# Patient Record
Sex: Female | Born: 1955 | Race: White | Hispanic: No | Smoking: Current every day smoker
Health system: Southern US, Community
[De-identification: ages and names within clinical notes are randomized; demographics above are authoritative.]

## PROBLEM LIST (undated history)

## (undated) DIAGNOSIS — F32A Depression, unspecified: Secondary | ICD-10-CM

## (undated) DIAGNOSIS — F988 Other specified behavioral and emotional disorders with onset usually occurring in childhood and adolescence: Secondary | ICD-10-CM

## (undated) DIAGNOSIS — M1990 Unspecified osteoarthritis, unspecified site: Secondary | ICD-10-CM

## (undated) DIAGNOSIS — F419 Anxiety disorder, unspecified: Secondary | ICD-10-CM

## (undated) DIAGNOSIS — M797 Fibromyalgia: Secondary | ICD-10-CM

## (undated) DIAGNOSIS — F329 Major depressive disorder, single episode, unspecified: Secondary | ICD-10-CM

## (undated) DIAGNOSIS — I4891 Unspecified atrial fibrillation: Secondary | ICD-10-CM

## (undated) DIAGNOSIS — E785 Hyperlipidemia, unspecified: Secondary | ICD-10-CM

## (undated) DIAGNOSIS — J449 Chronic obstructive pulmonary disease, unspecified: Secondary | ICD-10-CM

## (undated) DIAGNOSIS — I5020 Unspecified systolic (congestive) heart failure: Secondary | ICD-10-CM

## (undated) DIAGNOSIS — F1910 Other psychoactive substance abuse, uncomplicated: Secondary | ICD-10-CM

## (undated) HISTORY — PX: ANKLE SURGERY: SHX546

## (undated) HISTORY — PX: FACIAL COSMETIC SURGERY: SHX629

---

## 1998-04-07 ENCOUNTER — Emergency Department (HOSPITAL_COMMUNITY): Admission: EM | Admit: 1998-04-07 | Discharge: 1998-04-07 | Payer: Self-pay | Admitting: Emergency Medicine

## 1998-08-22 ENCOUNTER — Emergency Department (HOSPITAL_COMMUNITY): Admission: EM | Admit: 1998-08-22 | Discharge: 1998-08-22 | Payer: Self-pay | Admitting: Emergency Medicine

## 1998-12-05 ENCOUNTER — Emergency Department (HOSPITAL_COMMUNITY): Admission: EM | Admit: 1998-12-05 | Discharge: 1998-12-05 | Payer: Self-pay | Admitting: Emergency Medicine

## 1999-09-17 ENCOUNTER — Encounter: Payer: Self-pay | Admitting: *Deleted

## 1999-09-17 ENCOUNTER — Emergency Department (HOSPITAL_COMMUNITY): Admission: EM | Admit: 1999-09-17 | Discharge: 1999-09-17 | Payer: Self-pay | Admitting: *Deleted

## 1999-09-18 ENCOUNTER — Encounter: Payer: Self-pay | Admitting: Emergency Medicine

## 1999-09-18 ENCOUNTER — Emergency Department (HOSPITAL_COMMUNITY): Admission: EM | Admit: 1999-09-18 | Discharge: 1999-09-18 | Payer: Self-pay | Admitting: Emergency Medicine

## 1999-12-27 ENCOUNTER — Emergency Department (HOSPITAL_COMMUNITY): Admission: EM | Admit: 1999-12-27 | Discharge: 1999-12-27 | Payer: Self-pay | Admitting: Emergency Medicine

## 2000-05-22 ENCOUNTER — Emergency Department (HOSPITAL_COMMUNITY): Admission: EM | Admit: 2000-05-22 | Discharge: 2000-05-22 | Payer: Self-pay | Admitting: Emergency Medicine

## 2000-09-13 ENCOUNTER — Emergency Department (HOSPITAL_COMMUNITY): Admission: EM | Admit: 2000-09-13 | Discharge: 2000-09-13 | Payer: Self-pay | Admitting: Emergency Medicine

## 2000-09-13 ENCOUNTER — Encounter: Payer: Self-pay | Admitting: Emergency Medicine

## 2002-04-29 ENCOUNTER — Encounter: Payer: Self-pay | Admitting: Emergency Medicine

## 2002-04-29 ENCOUNTER — Emergency Department (HOSPITAL_COMMUNITY): Admission: EM | Admit: 2002-04-29 | Discharge: 2002-04-29 | Payer: Self-pay | Admitting: Emergency Medicine

## 2003-12-08 ENCOUNTER — Emergency Department (HOSPITAL_COMMUNITY): Admission: EM | Admit: 2003-12-08 | Discharge: 2003-12-08 | Payer: Self-pay

## 2003-12-12 ENCOUNTER — Emergency Department (HOSPITAL_COMMUNITY): Admission: EM | Admit: 2003-12-12 | Discharge: 2003-12-12 | Payer: Self-pay | Admitting: Family Medicine

## 2003-12-13 ENCOUNTER — Emergency Department (HOSPITAL_COMMUNITY): Admission: EM | Admit: 2003-12-13 | Discharge: 2003-12-13 | Payer: Self-pay | Admitting: Family Medicine

## 2004-03-08 ENCOUNTER — Emergency Department (HOSPITAL_COMMUNITY): Admission: EM | Admit: 2004-03-08 | Discharge: 2004-03-08 | Payer: Self-pay | Admitting: *Deleted

## 2004-10-02 ENCOUNTER — Ambulatory Visit: Payer: Self-pay | Admitting: Internal Medicine

## 2005-03-31 ENCOUNTER — Emergency Department (HOSPITAL_COMMUNITY): Admission: EM | Admit: 2005-03-31 | Discharge: 2005-03-31 | Payer: Self-pay | Admitting: Emergency Medicine

## 2005-05-12 ENCOUNTER — Emergency Department (HOSPITAL_COMMUNITY): Admission: EM | Admit: 2005-05-12 | Discharge: 2005-05-12 | Payer: Self-pay | Admitting: Family Medicine

## 2005-07-23 ENCOUNTER — Emergency Department (HOSPITAL_COMMUNITY): Admission: EM | Admit: 2005-07-23 | Discharge: 2005-07-23 | Payer: Self-pay | Admitting: Emergency Medicine

## 2005-07-29 ENCOUNTER — Emergency Department (HOSPITAL_COMMUNITY): Admission: EM | Admit: 2005-07-29 | Discharge: 2005-07-29 | Payer: Self-pay | Admitting: Emergency Medicine

## 2005-08-12 ENCOUNTER — Ambulatory Visit: Payer: Self-pay | Admitting: Internal Medicine

## 2006-02-16 ENCOUNTER — Ambulatory Visit: Payer: Self-pay | Admitting: Family Medicine

## 2006-02-16 ENCOUNTER — Ambulatory Visit: Payer: Self-pay | Admitting: *Deleted

## 2006-04-10 DIAGNOSIS — Z8719 Personal history of other diseases of the digestive system: Secondary | ICD-10-CM | POA: Insufficient documentation

## 2006-04-21 ENCOUNTER — Emergency Department (HOSPITAL_COMMUNITY): Admission: EM | Admit: 2006-04-21 | Discharge: 2006-04-21 | Payer: Self-pay | Admitting: *Deleted

## 2006-06-01 ENCOUNTER — Emergency Department (HOSPITAL_COMMUNITY): Admission: EM | Admit: 2006-06-01 | Discharge: 2006-06-01 | Payer: Self-pay | Admitting: Emergency Medicine

## 2006-06-01 DIAGNOSIS — K5730 Diverticulosis of large intestine without perforation or abscess without bleeding: Secondary | ICD-10-CM | POA: Insufficient documentation

## 2006-07-14 ENCOUNTER — Emergency Department (HOSPITAL_COMMUNITY): Admission: AC | Admit: 2006-07-14 | Discharge: 2006-07-14 | Payer: Self-pay

## 2006-07-15 ENCOUNTER — Emergency Department (HOSPITAL_COMMUNITY): Admission: EM | Admit: 2006-07-15 | Discharge: 2006-07-16 | Payer: Self-pay | Admitting: Emergency Medicine

## 2006-09-12 ENCOUNTER — Emergency Department (HOSPITAL_COMMUNITY): Admission: EM | Admit: 2006-09-12 | Discharge: 2006-09-12 | Payer: Self-pay | Admitting: Emergency Medicine

## 2006-09-15 ENCOUNTER — Inpatient Hospital Stay (HOSPITAL_COMMUNITY): Admission: AD | Admit: 2006-09-15 | Discharge: 2006-09-15 | Payer: Self-pay | Admitting: Obstetrics and Gynecology

## 2006-09-20 ENCOUNTER — Ambulatory Visit: Payer: Self-pay | Admitting: Internal Medicine

## 2006-10-20 ENCOUNTER — Encounter (INDEPENDENT_AMBULATORY_CARE_PROVIDER_SITE_OTHER): Payer: Self-pay | Admitting: *Deleted

## 2006-10-20 ENCOUNTER — Encounter (INDEPENDENT_AMBULATORY_CARE_PROVIDER_SITE_OTHER): Payer: Self-pay | Admitting: Internal Medicine

## 2006-10-20 ENCOUNTER — Ambulatory Visit: Payer: Self-pay | Admitting: *Deleted

## 2006-11-08 ENCOUNTER — Ambulatory Visit (HOSPITAL_COMMUNITY): Admission: RE | Admit: 2006-11-08 | Discharge: 2006-11-08 | Payer: Self-pay | Admitting: Obstetrics & Gynecology

## 2006-11-26 ENCOUNTER — Encounter: Admission: RE | Admit: 2006-11-26 | Discharge: 2006-11-26 | Payer: Self-pay | Admitting: Internal Medicine

## 2006-12-02 ENCOUNTER — Ambulatory Visit: Payer: Self-pay | Admitting: Internal Medicine

## 2006-12-02 DIAGNOSIS — G5600 Carpal tunnel syndrome, unspecified upper limb: Secondary | ICD-10-CM | POA: Insufficient documentation

## 2007-01-04 ENCOUNTER — Ambulatory Visit: Payer: Self-pay | Admitting: Internal Medicine

## 2007-04-27 ENCOUNTER — Encounter (INDEPENDENT_AMBULATORY_CARE_PROVIDER_SITE_OTHER): Payer: Self-pay | Admitting: *Deleted

## 2007-05-09 ENCOUNTER — Telehealth (INDEPENDENT_AMBULATORY_CARE_PROVIDER_SITE_OTHER): Payer: Self-pay | Admitting: *Deleted

## 2007-05-13 DIAGNOSIS — R498 Other voice and resonance disorders: Secondary | ICD-10-CM | POA: Insufficient documentation

## 2007-05-13 DIAGNOSIS — G4700 Insomnia, unspecified: Secondary | ICD-10-CM | POA: Insufficient documentation

## 2007-05-26 ENCOUNTER — Ambulatory Visit: Payer: Self-pay | Admitting: Internal Medicine

## 2007-05-26 DIAGNOSIS — M76899 Other specified enthesopathies of unspecified lower limb, excluding foot: Secondary | ICD-10-CM | POA: Insufficient documentation

## 2007-05-26 DIAGNOSIS — F1910 Other psychoactive substance abuse, uncomplicated: Secondary | ICD-10-CM | POA: Insufficient documentation

## 2007-05-26 DIAGNOSIS — M545 Low back pain, unspecified: Secondary | ICD-10-CM | POA: Insufficient documentation

## 2007-05-26 DIAGNOSIS — K7460 Unspecified cirrhosis of liver: Secondary | ICD-10-CM | POA: Insufficient documentation

## 2007-05-26 DIAGNOSIS — B1710 Acute hepatitis C without hepatic coma: Secondary | ICD-10-CM | POA: Insufficient documentation

## 2007-05-27 ENCOUNTER — Encounter (INDEPENDENT_AMBULATORY_CARE_PROVIDER_SITE_OTHER): Payer: Self-pay | Admitting: Internal Medicine

## 2007-06-02 ENCOUNTER — Encounter (INDEPENDENT_AMBULATORY_CARE_PROVIDER_SITE_OTHER): Payer: Self-pay | Admitting: Internal Medicine

## 2007-06-03 ENCOUNTER — Emergency Department (HOSPITAL_COMMUNITY): Admission: EM | Admit: 2007-06-03 | Discharge: 2007-06-03 | Payer: Self-pay | Admitting: Emergency Medicine

## 2007-06-12 ENCOUNTER — Encounter (INDEPENDENT_AMBULATORY_CARE_PROVIDER_SITE_OTHER): Payer: Self-pay | Admitting: Internal Medicine

## 2007-06-14 LAB — CONVERTED CEMR LAB
ALT: 19 units/L (ref 0–35)
Albumin: 3.7 g/dL (ref 3.5–5.2)
Alkaline Phosphatase: 58 units/L (ref 39–117)
Basophils Absolute: 0 10*3/uL (ref 0.0–0.1)
Eosinophils Absolute: 0.1 10*3/uL (ref 0.0–0.7)
Glucose, Bld: 77 mg/dL (ref 70–99)
HCV Quantitative: 47300 intl units/mL — ABNORMAL HIGH (ref <5)
Lymphocytes Relative: 16 % (ref 12–46)
Lymphs Abs: 1.3 10*3/uL (ref 0.7–3.3)
MCV: 90.6 fL (ref 78.0–100.0)
Neutrophils Relative %: 76 % (ref 43–77)
Platelets: 338 10*3/uL (ref 150–400)
Potassium: 4.7 meq/L (ref 3.5–5.3)
RDW: 16.7 % — ABNORMAL HIGH (ref 11.5–14.0)
Sodium: 139 meq/L (ref 135–145)
Total Protein: 6.7 g/dL (ref 6.0–8.3)
WBC: 8.2 10*3/uL (ref 4.0–10.5)

## 2007-06-28 ENCOUNTER — Ambulatory Visit: Payer: Self-pay | Admitting: Internal Medicine

## 2007-06-28 DIAGNOSIS — B351 Tinea unguium: Secondary | ICD-10-CM | POA: Insufficient documentation

## 2007-06-29 ENCOUNTER — Ambulatory Visit: Payer: Self-pay | Admitting: Internal Medicine

## 2007-06-29 LAB — CONVERTED CEMR LAB: INR: 0.9 (ref 0.0–1.5)

## 2007-09-08 ENCOUNTER — Emergency Department (HOSPITAL_COMMUNITY): Admission: EM | Admit: 2007-09-08 | Discharge: 2007-09-08 | Payer: Self-pay | Admitting: Emergency Medicine

## 2007-10-11 ENCOUNTER — Ambulatory Visit: Payer: Self-pay | Admitting: Internal Medicine

## 2007-10-11 DIAGNOSIS — J019 Acute sinusitis, unspecified: Secondary | ICD-10-CM | POA: Insufficient documentation

## 2007-10-12 ENCOUNTER — Telehealth (INDEPENDENT_AMBULATORY_CARE_PROVIDER_SITE_OTHER): Payer: Self-pay | Admitting: Internal Medicine

## 2007-10-17 ENCOUNTER — Emergency Department (HOSPITAL_COMMUNITY): Admission: EM | Admit: 2007-10-17 | Discharge: 2007-10-17 | Payer: Self-pay | Admitting: Emergency Medicine
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
## 2008-02-27 ENCOUNTER — Emergency Department (HOSPITAL_COMMUNITY): Admission: EM | Admit: 2008-02-27 | Discharge: 2008-02-27 | Payer: Self-pay | Admitting: Emergency Medicine

## 2009-09-05 ENCOUNTER — Emergency Department (HOSPITAL_COMMUNITY): Admission: EM | Admit: 2009-09-05 | Discharge: 2009-09-06 | Payer: Self-pay | Admitting: Emergency Medicine

## 2009-09-28 ENCOUNTER — Emergency Department (HOSPITAL_BASED_OUTPATIENT_CLINIC_OR_DEPARTMENT_OTHER): Admission: EM | Admit: 2009-09-28 | Discharge: 2009-09-28 | Payer: Self-pay | Admitting: Emergency Medicine

## 2010-10-27 LAB — POCT I-STAT, CHEM 8
BUN: 13 mg/dL (ref 6–23)
Chloride: 109 mEq/L (ref 96–112)
Creatinine, Ser: 0.6 mg/dL (ref 0.4–1.2)
Potassium: 4.1 mEq/L (ref 3.5–5.1)
Sodium: 142 mEq/L (ref 135–145)
TCO2: 26 mmol/L (ref 0–100)

## 2010-10-27 LAB — URINALYSIS, ROUTINE W REFLEX MICROSCOPIC
Glucose, UA: NEGATIVE mg/dL
Hgb urine dipstick: NEGATIVE
Specific Gravity, Urine: 1.028 (ref 1.005–1.030)
pH: 6.5 (ref 5.0–8.0)

## 2010-10-28 ENCOUNTER — Emergency Department (HOSPITAL_COMMUNITY): Payer: BC Managed Care – PPO

## 2010-10-28 ENCOUNTER — Emergency Department (HOSPITAL_COMMUNITY)
Admission: EM | Admit: 2010-10-28 | Discharge: 2010-10-28 | Disposition: A | Discharge status: Home / Self Care | Payer: BC Managed Care – PPO | Attending: Emergency Medicine | Admitting: Emergency Medicine

## 2010-10-28 DIAGNOSIS — I10 Essential (primary) hypertension: Secondary | ICD-10-CM | POA: Insufficient documentation

## 2010-10-28 DIAGNOSIS — M25559 Pain in unspecified hip: Secondary | ICD-10-CM | POA: Insufficient documentation

## 2010-10-28 DIAGNOSIS — M545 Low back pain, unspecified: Secondary | ICD-10-CM | POA: Insufficient documentation
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
## 2012-06-01 ENCOUNTER — Emergency Department (HOSPITAL_COMMUNITY)
Admission: EM | Admit: 2012-06-01 | Discharge: 2012-06-01 | Disposition: A | Discharge status: Home / Self Care | Payer: BC Managed Care – PPO | Attending: Emergency Medicine | Admitting: Emergency Medicine

## 2012-06-01 ENCOUNTER — Encounter (HOSPITAL_COMMUNITY): Payer: Self-pay | Admitting: *Deleted

## 2012-06-01 DIAGNOSIS — F17200 Nicotine dependence, unspecified, uncomplicated: Secondary | ICD-10-CM | POA: Insufficient documentation

## 2012-06-01 DIAGNOSIS — F988 Other specified behavioral and emotional disorders with onset usually occurring in childhood and adolescence: Secondary | ICD-10-CM | POA: Insufficient documentation

## 2012-06-01 DIAGNOSIS — M069 Rheumatoid arthritis, unspecified: Secondary | ICD-10-CM | POA: Insufficient documentation

## 2012-06-01 DIAGNOSIS — Z79899 Other long term (current) drug therapy: Secondary | ICD-10-CM | POA: Insufficient documentation

## 2012-06-01 DIAGNOSIS — F1910 Other psychoactive substance abuse, uncomplicated: Secondary | ICD-10-CM | POA: Insufficient documentation

## 2012-06-01 HISTORY — DX: Other specified behavioral and emotional disorders with onset usually occurring in childhood and adolescence: F98.8

## 2012-06-01 HISTORY — DX: Unspecified osteoarthritis, unspecified site: M19.90

## 2012-06-01 HISTORY — DX: Other psychoactive substance abuse, uncomplicated: F19.10

## 2012-06-01 MED ORDER — PREDNISONE 20 MG PO TABS: ORAL_TABLET | ORAL | Status: DC

## 2012-06-01 MED ORDER — PREDNISONE 20 MG PO TABS
60.0000 mg | ORAL_TABLET | Freq: Once | ORAL | Status: AC
  Administered 2012-06-01: 60 mg via ORAL
  Filled 2012-06-01: qty 3

## 2012-06-01 NOTE — ED Provider Notes (Signed)
History     CSN: [id number]  Arrival date & time 06/01/12  0019   First MD Initiated Contact with Patient 06/01/12 0128      Chief Complaint  Patient presents with  . Hand Pain    (Consider location/radiation/quality/duration/timing/severity/associated sxs/prior treatment) HPI Comments: Patient with a history of particularly in her hands has increased stable until this morning when she noticed increased pain and swelling particularly of the right hand she's taking over-the-counter ibuprofen without much relief.  Patient is a 56 y.o. female presenting with hand pain. The history is provided by the patient.  Hand Pain This is a recurrent problem. The current episode started today. Associated symptoms include joint swelling. Pertinent negatives include no chills, fever, numbness or weakness.    Past Medical History  Diagnosis Date  . Arthritis   . Attention deficit disorder (ADD)   . Drug abuse     History reviewed. No pertinent past surgical history.  No family history on file.  History  Substance Use Topics  . Smoking status: Current Some Day Smoker -- 0.5 packs/day  . Smokeless tobacco: Never Used  . Alcohol Use: No    OB History    Grav Para Term Preterm Abortions TAB SAB Ect Mult Living                  Review of Systems  Constitutional: Negative for fever and chills.  Musculoskeletal: Positive for joint swelling.  Skin: Positive for color change.  Neurological: Negative for dizziness, weakness and numbness.    Allergies  Review of patient's allergies indicates no known allergies.  Home Medications   Current Outpatient Rx  Name Route Sig Dispense Refill  . AMPHETAMINE-DEXTROAMPHET ER 30 MG PO CP24 Oral Take 30 mg by mouth every morning.    Marland Kitchen AMPHETAMINE-DEXTROAMPHETAMINE 10 MG PO TABS Oral Take 10 mg by mouth every evening.    Marland Kitchen BUPRENORPHINE HCL-NALOXONE HCL 8-2 MG SL SUBL Sublingual Place 1 tablet under the tongue daily. May take additional doses if  needed    . MAGNESIUM PO Oral Take 1 tablet by mouth daily.    . ADULT MULTIVITAMIN W/MINERALS CH Oral Take 1 tablet by mouth daily.    Marland Kitchen FISH OIL PO Oral Take 1 capsule by mouth daily.    Marland Kitchen PROBIOTIC DAILY PO Oral Take 1 tablet by mouth daily.    Marland Kitchen PREDNISONE 20 MG PO TABS  3 Tabs PO Days 1-3, then 2 tabs PO Days 4-6, then 1 tab PO Day 7-9, then Half Tab PO Day 10-12 20 tablet 0    BP 107/66  Pulse 88  Temp 98.3 F (36.8 C) (Oral)  Resp 20  SpO2 96%  Physical Exam  Constitutional: She appears well-developed and well-nourished.  HENT:  Head: Normocephalic.  Eyes: Pupils are equal, round, and reactive to light.  Neck: Normal range of motion.  Cardiovascular: Normal rate.   Pulmonary/Chest: Effort normal.  Musculoskeletal: She exhibits edema and tenderness.       Dorsal aspect of the right hand would swollen tender to touch decreased range of motion of the fifth finger at the DIP joint PIP joints the remainder of the fingers slightly swollen for including thumb  Neurological: She is alert.  Skin: Skin is warm. There is erythema.    ED Course  Procedures (including critical care time)  Labs Reviewed - No data to display No results found.   1. Rheumatoid arthritis flare       MDM   Rheumatoid  arthritis flare        Arman Filter, NP 06/02/12 2026

## 2012-06-01 NOTE — ED Notes (Signed)
Pt denies any questions upon discharge. 

## 2012-06-01 NOTE — ED Notes (Signed)
Pt reports rt hand is red ,swelling and painful. Pt reports a hx of AR in hands.

## 2012-06-04 NOTE — ED Provider Notes (Signed)
Medical screening examination/treatment/procedure(s) were performed by non-physician practitioner and as supervising physician I was immediately available for consultation/collaboration.  Cheri Guppy, MD 06/04/12 [phone]

## 2012-09-16 ENCOUNTER — Emergency Department (HOSPITAL_COMMUNITY)
Admission: EM | Admit: 2012-09-16 | Discharge: 2012-09-16 | Disposition: A | Discharge status: Home / Self Care | Payer: BC Managed Care – PPO | Attending: Emergency Medicine | Admitting: Emergency Medicine

## 2012-09-16 ENCOUNTER — Emergency Department (HOSPITAL_COMMUNITY): Payer: BC Managed Care – PPO

## 2012-09-16 ENCOUNTER — Encounter (HOSPITAL_COMMUNITY): Payer: Self-pay | Admitting: *Deleted

## 2012-09-16 DIAGNOSIS — W230XXA Caught, crushed, jammed, or pinched between moving objects, initial encounter: Secondary | ICD-10-CM | POA: Insufficient documentation

## 2012-09-16 DIAGNOSIS — M7989 Other specified soft tissue disorders: Secondary | ICD-10-CM | POA: Insufficient documentation

## 2012-09-16 DIAGNOSIS — Y9289 Other specified places as the place of occurrence of the external cause: Secondary | ICD-10-CM | POA: Insufficient documentation

## 2012-09-16 DIAGNOSIS — S6990XA Unspecified injury of unspecified wrist, hand and finger(s), initial encounter: Secondary | ICD-10-CM

## 2012-09-16 DIAGNOSIS — F17200 Nicotine dependence, unspecified, uncomplicated: Secondary | ICD-10-CM | POA: Insufficient documentation

## 2012-09-16 DIAGNOSIS — Y9389 Activity, other specified: Secondary | ICD-10-CM | POA: Insufficient documentation

## 2012-09-16 DIAGNOSIS — F988 Other specified behavioral and emotional disorders with onset usually occurring in childhood and adolescence: Secondary | ICD-10-CM | POA: Insufficient documentation

## 2012-09-16 DIAGNOSIS — IMO0002 Reserved for concepts with insufficient information to code with codable children: Secondary | ICD-10-CM | POA: Insufficient documentation

## 2012-09-16 MED ORDER — NAPROXEN 500 MG PO TABS
500.0000 mg | ORAL_TABLET | Freq: Once | ORAL | Status: AC
  Administered 2012-09-16: 500 mg via ORAL
  Filled 2012-09-16: qty 1

## 2012-09-16 MED ORDER — LIDOCAINE HCL 2 % IJ SOLN
INTRAMUSCULAR | Status: AC
  Administered 2012-09-16: 1 mL
  Filled 2012-09-16: qty 20

## 2012-09-16 MED ORDER — PREDNISONE 20 MG PO TABS: 60.0000 mg | ORAL_TABLET | Freq: Every day | ORAL | Status: DC

## 2012-09-16 NOTE — ED Notes (Signed)
Ring cutter at bedside

## 2012-09-16 NOTE — ED Notes (Signed)
Pt still in XRay 

## 2012-09-16 NOTE — ED Notes (Signed)
Pt has right thumb swollen with 3 rings on lower end of thumb.

## 2012-09-16 NOTE — ED Notes (Signed)
Pt hit right thumb at work on Monday; increased swelling; today painful-swollen red with white infected area noted; rings on thumb

## 2012-09-16 NOTE — ED Notes (Signed)
Pt ambulated to bathroom 

## 2012-09-24 NOTE — ED Provider Notes (Signed)
History     CSN: [id number]  Arrival date & time 09/16/12  0236   First MD Initiated Contact with Patient 09/16/12 0321      No chief complaint on file.   (Consider location/radiation/quality/duration/timing/severity/associated sxs/prior treatment) HPI Autumn [name] is a 57 y.o. 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  The thumb has a small wound and bled initially, then the thumb pad began to swell and increase in pain over the last few days.  Pain is moderate, associated with some mild drainage of pus, denies loss of functino of the thumb, no pain on flexion, to pain in the hand, no numbness or tingling, no fever or chills.  Past Medical History  Diagnosis Date  . Arthritis   . Attention deficit disorder (ADD)   . Drug abuse     History reviewed. No pertinent past surgical history.  No family history on file.  History  Substance Use Topics  . Smoking status: Current Some Day Smoker -- 0.50 packs/day  . Smokeless tobacco: Never Used  . Alcohol Use: No    OB History   Grav Para Term Preterm Abortions TAB SAB Ect Mult Living                  Review of Systems At least 10pt or greater review of systems completed and are negative except where specified in the HPI.  Allergies  Review of patient's allergies indicates no known allergies.  Home Medications   Current Outpatient Rx  Name  Route  Sig  Dispense  Refill  . amphetamine-dextroamphetamine (ADDERALL XR) 30 MG 24 hr capsule   Oral   Take 30 mg by mouth every morning.         Marland Kitchen amphetamine-dextroamphetamine (ADDERALL) 10 MG tablet   Oral   Take 10 mg by mouth every evening.         . buprenorphine-naloxone (SUBOXONE) 8-2 MG SUBL   Sublingual   Place 1 tablet under the tongue daily. May take additional doses if needed         . glucosamine-chondroitin 500-400 MG tablet   Oral   Take 1 tablet by mouth every morning.         . magnesium gluconate  (MAGONATE) 500 MG tablet   Oral   Take 500 mg by mouth every morning.         . Multiple Vitamin (MULTIVITAMIN WITH MINERALS) TABS   Oral   Take 1 tablet by mouth every morning.          . Omega-3 Fatty Acids (FISH OIL PO)   Oral   Take 1 capsule by mouth every morning.          . Probiotic Product (PROBIOTIC DAILY PO)   Oral   Take 1 tablet by mouth every morning.          . predniSONE (DELTASONE) 20 MG tablet   Oral   Take 3 tablets (60 mg total) by mouth daily.   15 tablet   0     BP 156/94  Pulse 67  Temp(Src) 97.6 F (36.4 C) (Oral)  Resp 16  SpO2 99%  Physical Exam  Nursing notes reviewed.  Electronic medical record reviewed. VITAL SIGNS:   Filed Vitals:   09/16/12 0313 09/16/12 0631 09/16/12 0631 09/16/12 0632  BP: 113/67  156/94   Pulse:  67  67  Temp:   97.6 F (36.4 C)  TempSrc:   Oral   Resp:   16   SpO2:  99%  99%   CONSTITUTIONAL: Awake, oriented, appears non-toxic HENT: Atraumatic, normocephalic, oral mucosa pink and moist, airway patent. Nares patent without drainage. External ears normal. EYES: Conjunctiva clear, EOMI, PERRLA NECK: Trachea midline, non-tender, supple CARDIOVASCULAR: Normal heart rate, Normal rhythm, No murmurs, rubs, gallops PULMONARY/CHEST: Clear to auscultation, no rhonchi, wheezes, or rales. Symmetrical breath sounds. Non-tender. ABDOMINAL: Non-distended, soft, non-tender - no rebound or guarding.  BS normal. NEUROLOGIC: Non-focal, moving all four extremities, no gross sensory or motor deficits. EXTREMITIES: No clubbing, cyanosis, or edema.  Right hand has normal strength and sensation is intact distally to light tough on all fingers.  There is a fluctuant area with head over the right thumb pad where it is more TTP.  There has been drainage previously. SKIN: Warm, Dry, No erythema, No rash  ED Course  INCISION AND DRAINAGE Performed by: Jones Skene Authorized by: Jones Skene Consent: Verbal consent  obtained. Risks and benefits: risks, benefits and alternatives were discussed Consent given by: patient Indications for incision and drainage: thumb felon. Body area: upper extremity Location details: right thumb Anesthesia: digital block Local anesthetic: lidocaine 1% without epinephrine Anesthetic total: 4 ml Patient sedated: no Scalpel size: 11 Incision type: single straight Complexity: simple Drainage: purulent Drainage amount: 1-17ml. Wound treatment: wound left open Packing material: none Patient tolerance: Patient tolerated the procedure well with no immediate complications.   (including critical care time)  Labs Reviewed - No data to display No results found. Dg Hand Complete Right  09/16/2012  *RADIOLOGY REPORT*  Clinical Data: Inflamed and painful after injury, pinched between metal pains.  RIGHT HAND - COMPLETE 3+ VIEW  Comparison: None.  Findings: Diffuse soft tissue swelling involving the first displaced ossicles of the volar and dorsal plates of the distal phalanx which appear to be well corticated and likely chronic. Similar changes are demonstrated at the fourth proximal interphalangeal joint.  No definite evidence of acute fracture or subluxation.  There is diffuse bone demineralization.  Degenerative changes in the right radial carpal, STT, first carpometacarpal, first metacarpal phalangeal, and multiple interphalangeal joints. No destructive bone lesions.  No radiopaque soft tissue foreign bodies.  IMPRESSION: No acute bony abnormalities demonstrated.  Soft tissue swelling without radiopaque foreign body.  Diffuse degenerative changes.   Original Report Authenticated By: Burman Nieves, M.D.     1. Felon   2. Thumb injury       MDM  Autumn [name] is a 57 y.o. female presents with small felon to right thumb after injury.  No fx by XR - chronic changes seen on hand.  Thumb IND - no antibiotics indicated.    I explained the diagnosis and have given explicit  precautions to return to the ER including pain on flexion, worsening swelling, pain in the hand of along the flexor tendons, fever, chills or any other new or worsening symptoms. The patient understands and accepts the medical plan as it's been dictated and I have answered their questions. Discharge instructions concerning home care and prescription for prednisone has been given for recent arthritis flare have been given.  I don't think the steroid burst will have an adverse effect on her thumb healing.  The patient is STABLE and is discharged to home in good condition.         Jones Skene, MD 09/24/12 [phone]

## 2012-12-06 ENCOUNTER — Encounter (HOSPITAL_BASED_OUTPATIENT_CLINIC_OR_DEPARTMENT_OTHER): Payer: Self-pay | Admitting: *Deleted

## 2012-12-06 ENCOUNTER — Emergency Department (HOSPITAL_BASED_OUTPATIENT_CLINIC_OR_DEPARTMENT_OTHER)
Admission: EM | Admit: 2012-12-06 | Discharge: 2012-12-06 | Disposition: A | Discharge status: Home / Self Care | Payer: BC Managed Care – PPO | Attending: Emergency Medicine | Admitting: Emergency Medicine

## 2012-12-06 ENCOUNTER — Emergency Department (HOSPITAL_BASED_OUTPATIENT_CLINIC_OR_DEPARTMENT_OTHER): Payer: BC Managed Care – PPO

## 2012-12-06 DIAGNOSIS — F17200 Nicotine dependence, unspecified, uncomplicated: Secondary | ICD-10-CM | POA: Insufficient documentation

## 2012-12-06 DIAGNOSIS — R110 Nausea: Secondary | ICD-10-CM | POA: Insufficient documentation

## 2012-12-06 DIAGNOSIS — K2990 Gastroduodenitis, unspecified, without bleeding: Secondary | ICD-10-CM | POA: Insufficient documentation

## 2012-12-06 DIAGNOSIS — Z8739 Personal history of other diseases of the musculoskeletal system and connective tissue: Secondary | ICD-10-CM | POA: Insufficient documentation

## 2012-12-06 DIAGNOSIS — Z79899 Other long term (current) drug therapy: Secondary | ICD-10-CM | POA: Insufficient documentation

## 2012-12-06 DIAGNOSIS — K2970 Gastritis, unspecified, without bleeding: Secondary | ICD-10-CM

## 2012-12-06 DIAGNOSIS — F988 Other specified behavioral and emotional disorders with onset usually occurring in childhood and adolescence: Secondary | ICD-10-CM | POA: Insufficient documentation

## 2012-12-06 LAB — COMPREHENSIVE METABOLIC PANEL
Albumin: 3.8 g/dL (ref 3.5–5.2)
Alkaline Phosphatase: 77 U/L (ref 39–117)
BUN: 10 mg/dL (ref 6–23)
Creatinine, Ser: 0.6 mg/dL (ref 0.50–1.10)
GFR calc Af Amer: >90 mL/min (ref >90)
Glucose, Bld: 95 mg/dL (ref 70–99)
Potassium: 3.6 mEq/L (ref 3.5–5.1)
Total Bilirubin: 0.3 mg/dL (ref 0.3–1.2)
Total Protein: 8.2 g/dL (ref 6.0–8.3)

## 2012-12-06 LAB — URINALYSIS, ROUTINE W REFLEX MICROSCOPIC
Bilirubin Urine: NEGATIVE
Ketones, ur: NEGATIVE mg/dL
Nitrite: NEGATIVE
Specific Gravity, Urine: 1.021 (ref 1.005–1.030)
Urobilinogen, UA: 0.2 mg/dL (ref 0.0–1.0)

## 2012-12-06 LAB — LIPASE, BLOOD: Lipase: 22 U/L (ref 11–59)

## 2012-12-06 LAB — CBC WITH DIFFERENTIAL/PLATELET
Basophils Relative: 0 % (ref 0–1)
Eosinophils Absolute: 0.2 10*3/uL (ref 0.0–0.7)
HCT: 44.5 % (ref 36.0–46.0)
Hemoglobin: 15.4 g/dL — ABNORMAL HIGH (ref 12.0–15.0)
Lymphs Abs: 2 10*3/uL (ref 0.7–4.0)
MCH: 30.6 pg (ref 26.0–34.0)
MCHC: 34.6 g/dL (ref 30.0–36.0)
MCV: 88.3 fL (ref 78.0–100.0)
Monocytes Absolute: 0.5 10*3/uL (ref 0.1–1.0)
Monocytes Relative: 6 % (ref 3–12)
Neutrophils Relative %: 69 % (ref 43–77)
RBC: 5.04 MIL/uL (ref 3.87–5.11)

## 2012-12-06 MED ORDER — ESOMEPRAZOLE MAGNESIUM 40 MG PO CPDR: 40.0000 mg | DELAYED_RELEASE_CAPSULE | Freq: Every day | ORAL | Status: DC

## 2012-12-06 MED ORDER — SUCRALFATE 1 G PO TABS: 1.0000 g | ORAL_TABLET | Freq: Four times a day (QID) | ORAL | Status: DC

## 2012-12-06 MED ORDER — PANTOPRAZOLE SODIUM 40 MG IV SOLR
40.0000 mg | Freq: Once | INTRAVENOUS | Status: AC
  Administered 2012-12-06: 40 mg via INTRAVENOUS
  Filled 2012-12-06: qty 40

## 2012-12-06 MED ORDER — GI COCKTAIL ~~LOC~~
30.0000 mL | Freq: Once | ORAL | Status: AC
  Administered 2012-12-06: 30 mL via ORAL
  Filled 2012-12-06: qty 30

## 2012-12-06 MED ORDER — ONDANSETRON HCL 4 MG/2ML IJ SOLN
4.0000 mg | Freq: Once | INTRAMUSCULAR | Status: AC
  Administered 2012-12-06: 4 mg via INTRAVENOUS
  Filled 2012-12-06: qty 2

## 2012-12-06 MED ORDER — SODIUM CHLORIDE 0.9 % IV BOLUS (SEPSIS)
1000.0000 mL | Freq: Once | INTRAVENOUS | Status: AC
  Administered 2012-12-06: 1000 mL via INTRAVENOUS

## 2012-12-06 NOTE — ED Provider Notes (Signed)
History     CSN: [id number]  Arrival date & time 12/06/12  4098   First MD Initiated Contact with Patient 12/06/12 [phone]      Chief Complaint  Patient presents with  . Abdominal Pain    (Consider location/radiation/quality/duration/timing/severity/associated sxs/prior treatment) HPI Comments: Patient complains of a two-day history of sharp burning pain in her epigastric area. She states it is nonradiating. At times it intensifies. She's had some nausea but no vomiting. She denies any fevers or chills. She took some Maalox without relief. She states the pain was worsening today. She denies any diarrhea or change in bowel habits. She denies any urinary symptoms. She denies any past surgeries to her abdomen. She does have a history of a hiatal hernia but has never had treatment.  Patient is a 57 y.o. female presenting with abdominal pain.  Abdominal Pain Associated symptoms: nausea   Associated symptoms: no chest pain, no chills, no cough, no diarrhea, no fatigue, no fever, no hematuria, no shortness of breath and no vomiting     Past Medical History  Diagnosis Date  . Arthritis   . Attention deficit disorder (ADD)   . Drug abuse     History reviewed. No pertinent past surgical history.  History reviewed. No pertinent family history.  History  Substance Use Topics  . Smoking status: Current Some Day Smoker -- 0.50 packs/day  . Smokeless tobacco: Never Used  . Alcohol Use: No    OB History   Grav Para Term Preterm Abortions TAB SAB Ect Mult Living                  Review of Systems  Constitutional: Negative for fever, chills, diaphoresis and fatigue.  HENT: Negative for congestion, rhinorrhea and sneezing.   Eyes: Negative.   Respiratory: Negative for cough, chest tightness and shortness of breath.   Cardiovascular: Negative for chest pain and leg swelling.  Gastrointestinal: Positive for nausea and abdominal pain. Negative for vomiting, diarrhea and blood in stool.   Genitourinary: Negative for frequency, hematuria, flank pain and difficulty urinating.  Musculoskeletal: Negative for back pain and arthralgias.  Skin: Negative for rash.  Neurological: Negative for dizziness, speech difficulty, weakness, numbness and headaches.    Allergies  Review of patient's allergies indicates no known allergies.  Home Medications   Current Outpatient Rx  Name  Route  Sig  Dispense  Refill  . amphetamine-dextroamphetamine (ADDERALL XR) 30 MG 24 hr capsule   Oral   Take 30 mg by mouth every morning.         Marland Kitchen amphetamine-dextroamphetamine (ADDERALL) 10 MG tablet   Oral   Take 10 mg by mouth every evening.         . buprenorphine-naloxone (SUBOXONE) 8-2 MG SUBL   Sublingual   Place 1 tablet under the tongue daily. May take additional doses if needed         . esomeprazole (NEXIUM) 40 MG capsule   Oral   Take 1 capsule (40 mg total) by mouth daily.   30 capsule   0   . glucosamine-chondroitin 500-400 MG tablet   Oral   Take 1 tablet by mouth every morning.         . magnesium gluconate (MAGONATE) 500 MG tablet   Oral   Take 500 mg by mouth every morning.         . Multiple Vitamin (MULTIVITAMIN WITH MINERALS) TABS   Oral   Take 1 tablet by mouth every morning.          Marland Kitchen  Omega-3 Fatty Acids (FISH OIL PO)   Oral   Take 1 capsule by mouth every morning.          . predniSONE (DELTASONE) 20 MG tablet   Oral   Take 3 tablets (60 mg total) by mouth daily.   15 tablet   0   . Probiotic Product (PROBIOTIC DAILY PO)   Oral   Take 1 tablet by mouth every morning.          . sucralfate (CARAFATE) 1 G tablet   Oral   Take 1 tablet (1 g total) by mouth 4 (four) times daily.   30 tablet   0     BP 169/101  Pulse 68  Temp(Src) 98.2 F (36.8 C) (Oral)  Resp 16  Ht 5\' 5"  (1.651 m)  Wt 182 lb (82.555 kg)  BMI 30.29 kg/m2  SpO2 100%  Physical Exam  Constitutional: She is oriented to person, place, and time. She appears  well-developed and well-nourished.  HENT:  Head: Normocephalic and atraumatic.  Eyes: Pupils are equal, round, and reactive to light.  Neck: Normal range of motion. Neck supple.  Cardiovascular: Normal rate, regular rhythm and normal heart sounds.   Pulmonary/Chest: Effort normal and breath sounds normal. No respiratory distress. She has no wheezes. She has no rales. She exhibits no tenderness.  Abdominal: Soft. Bowel sounds are normal. There is tenderness (Moderate tenderness across the epigastrium). There is no rebound and no guarding.  Musculoskeletal: Normal range of motion. She exhibits no edema.  Lymphadenopathy:    She has no cervical adenopathy.  Neurological: She is alert and oriented to person, place, and time.  Skin: Skin is warm and dry. No rash noted.  Psychiatric: She has a normal mood and affect.    ED Course  Procedures (including critical care time)  Results for orders placed during the hospital encounter of 12/06/12  CBC WITH DIFFERENTIAL      Result Value Range   WBC 8.5  4.0 - 10.5 K/uL   RBC 5.04  3.87 - 5.11 MIL/uL   Hemoglobin 15.4 (*) 12.0 - 15.0 g/dL   HCT 62.1  30.8 - 65.7 %   MCV 88.3  78.0 - 100.0 fL   MCH 30.6  26.0 - 34.0 pg   MCHC 34.6  30.0 - 36.0 g/dL   RDW 84.6  96.2 - 95.2 %   Platelets 293  150 - 400 K/uL   Neutrophils Relative 69  43 - 77 %   Neutro Abs 5.9  1.7 - 7.7 K/uL   Lymphocytes Relative 23  12 - 46 %   Lymphs Abs 2.0  0.7 - 4.0 K/uL   Monocytes Relative 6  3 - 12 %   Monocytes Absolute 0.5  0.1 - 1.0 K/uL   Eosinophils Relative 2  0 - 5 %   Eosinophils Absolute 0.2  0.0 - 0.7 K/uL   Basophils Relative 0  0 - 1 %   Basophils Absolute 0.0  0.0 - 0.1 K/uL  COMPREHENSIVE METABOLIC PANEL      Result Value Range   Sodium 138  135 - 145 mEq/L   Potassium 3.6  3.5 - 5.1 mEq/L   Chloride 99  96 - 112 mEq/L   CO2 25  19 - 32 mEq/L   Glucose, Bld 95  70 - 99 mg/dL   BUN 10  6 - 23 mg/dL   Creatinine, Ser 8.41  0.50 - 1.10 mg/dL    Calcium 10.2  8.4 - 10.5 mg/dL   Total Protein 8.2  6.0 - 8.3 g/dL   Albumin 3.8  3.5 - 5.2 g/dL   AST 21  0 - 37 U/L   ALT 20  0 - 35 U/L   Alkaline Phosphatase 77  39 - 117 U/L   Total Bilirubin 0.3  0.3 - 1.2 mg/dL   GFR calc non Af Amer >90  >90 mL/min   GFR calc Af Amer >90  >90 mL/min  LIPASE, BLOOD      Result Value Range   Lipase 22  11 - 59 U/L  URINALYSIS, ROUTINE W REFLEX MICROSCOPIC      Result Value Range   Color, Urine YELLOW  YELLOW   APPearance CLOUDY (*) CLEAR   Specific Gravity, Urine 1.021  1.005 - 1.030   pH 5.5  5.0 - 8.0   Glucose, UA NEGATIVE  NEGATIVE mg/dL   Hgb urine dipstick NEGATIVE  NEGATIVE   Bilirubin Urine NEGATIVE  NEGATIVE   Ketones, ur NEGATIVE  NEGATIVE mg/dL   Protein, ur NEGATIVE  NEGATIVE mg/dL   Urobilinogen, UA 0.2  0.0 - 1.0 mg/dL   Nitrite NEGATIVE  NEGATIVE   Leukocytes, UA MODERATE (*) NEGATIVE  URINE MICROSCOPIC-ADD ON      Result Value Range   Squamous Epithelial / LPF FEW (*) RARE   WBC, UA 7-10  <3 WBC/hpf   RBC / HPF 0-2  <3 RBC/hpf   Bacteria, UA MANY (*) RARE   US Abdomen Complete  12/06/2012  *RADIOLOGY REPORT*  Clinical Data:  Epigastric pain.  History of alcohol abuse.  COMPLETE ABDOMINAL ULTRASOUND  Comparison:  06/01/2006 CT.  Findings:  Gallbladder:  No gallstones, gallbladder wall thickening, or pericholecystic fluid.  Common bile duct:  6.2 mm maximal dimension.  Distal aspect not visualized secondary to bowel gas.  Liver:  No focal lesion identified.  Within normal limits in parenchymal echogenicity.  IVC:  Limited evaluation secondary to bowel gas.  Pancreas:  Limited evaluation secondary to bowel gas.  Spleen:  4.6 cm.  No focal mass.  Right Kidney:  11.3 cm. No hydronephrosis or renal mass.  Left Kidney:  10.5 cm. No hydronephrosis or renal mass.  Abdominal aorta:  Limited evaluation secondary to bowel gas.  IMPRESSION: Secondary to bowel gas, limited evaluation of the inferior vena cava, pancreas and aorta.  No  gallstones.  Mild prominence mid common bile duct measuring up to 6.2 mm. Distal aspect not visualized secondary to bowel gas.   Original Report Authenticated By: Lacy Duverney, M.D.       1. Gastritis       MDM  Patient is feeling much better after GI cocktail. There is no evidence of gallbladder disease. There is no evidence of pancreatitis. I will go ahead and discharge her with prescription for Carafate and Nexium. I advised her and bland diet. I give her referral to followup with gastroenterology if her symptoms do not improve. She currently is asymptomatic for urinary tract infections at this point I did not treat her urine but I did send it for culture.        Rolan Bucco, MD 12/06/12 1316

## 2012-12-06 NOTE — ED Notes (Signed)
Pt amb to room 10 with quick steady gait, holding her abd area. Pt denies any vomiting, fevers, last bm yesterday was normal. Pt states pain is to epigastric area and "it comes and it goes...very intense".

## 2012-12-07 ENCOUNTER — Emergency Department (HOSPITAL_BASED_OUTPATIENT_CLINIC_OR_DEPARTMENT_OTHER)
Admission: EM | Admit: 2012-12-07 | Discharge: 2012-12-07 | Disposition: A | Discharge status: Home / Self Care | Payer: BC Managed Care – PPO | Attending: Emergency Medicine | Admitting: Emergency Medicine

## 2012-12-07 ENCOUNTER — Encounter (HOSPITAL_BASED_OUTPATIENT_CLINIC_OR_DEPARTMENT_OTHER): Payer: Self-pay

## 2012-12-07 DIAGNOSIS — Z8739 Personal history of other diseases of the musculoskeletal system and connective tissue: Secondary | ICD-10-CM | POA: Insufficient documentation

## 2012-12-07 DIAGNOSIS — R112 Nausea with vomiting, unspecified: Secondary | ICD-10-CM | POA: Insufficient documentation

## 2012-12-07 DIAGNOSIS — F17200 Nicotine dependence, unspecified, uncomplicated: Secondary | ICD-10-CM | POA: Insufficient documentation

## 2012-12-07 DIAGNOSIS — Z79899 Other long term (current) drug therapy: Secondary | ICD-10-CM | POA: Insufficient documentation

## 2012-12-07 DIAGNOSIS — G8929 Other chronic pain: Secondary | ICD-10-CM | POA: Insufficient documentation

## 2012-12-07 DIAGNOSIS — IMO0002 Reserved for concepts with insufficient information to code with codable children: Secondary | ICD-10-CM | POA: Insufficient documentation

## 2012-12-07 DIAGNOSIS — F988 Other specified behavioral and emotional disorders with onset usually occurring in childhood and adolescence: Secondary | ICD-10-CM | POA: Insufficient documentation

## 2012-12-07 LAB — URINE CULTURE: Culture: NO GROWTH

## 2012-12-07 LAB — BASIC METABOLIC PANEL
BUN: 7 mg/dL (ref 6–23)
CO2: 24 mEq/L (ref 19–32)
Chloride: 102 mEq/L (ref 96–112)
Creatinine, Ser: 0.5 mg/dL (ref 0.50–1.10)

## 2012-12-07 MED ORDER — PANTOPRAZOLE SODIUM 40 MG IV SOLR
40.0000 mg | Freq: Once | INTRAVENOUS | Status: AC
  Administered 2012-12-07: 40 mg via INTRAVENOUS
  Filled 2012-12-07: qty 40

## 2012-12-07 MED ORDER — SODIUM CHLORIDE 0.9 % IV SOLN
INTRAVENOUS | Status: DC
  Administered 2012-12-07: 19:00:00 via INTRAVENOUS

## 2012-12-07 MED ORDER — ONDANSETRON HCL 4 MG/2ML IJ SOLN
4.0000 mg | Freq: Once | INTRAMUSCULAR | Status: AC
  Administered 2012-12-07: 4 mg via INTRAVENOUS
  Filled 2012-12-07: qty 2

## 2012-12-07 NOTE — ED Notes (Signed)
Pt reports abdominal pain, nausea, and vomiting.  She was seen in ED yesterday for the same but unable to get RX Nexium filled.

## 2012-12-07 NOTE — ED Provider Notes (Signed)
History     CSN: [id number]  Arrival date & time 12/07/12  1742   First MD Initiated Contact with Patient 12/07/12 1833      Chief Complaint  Patient presents with  . Abdominal Pain    (Consider location/radiation/quality/duration/timing/severity/associated sxs/prior treatment) HPI Comments: Patient is a 57 year old lady who complains of abdominal pain nausea and vomiting. She had been seen yesterday by Dr. Fredderick Phenix, and had physical examination laboratory testing, an ultrasound of her abdomen, all of which were negative. She was prescribed Nexium. Her pharmacy plan change this to Prevacid. She took a dose of breath last night as well as Zantac. During the night she developed abdominal pain and vomiting. She's been vomiting all day and has been unable to keep her medications down. She has ADD and chronic pain, is on amphetamines and Suboxone. She's been unable to take those medicines today.  Patient is a 57 y.o. female presenting with abdominal pain. The history is provided by the patient and medical records. No language interpreter was used.  Abdominal Pain Pain location:  Epigastric Pain quality: burning   Pain radiates to:  Does not radiate Pain severity:  Moderate (Pain rated at a 6 by the patient.) Onset quality:  Gradual Duration:  12 hours Timing:  Constant Progression:  Waxing and waning Chronicity:  Recurrent Relieved by:  Nothing Worsened by:  Nothing tried Ineffective treatments: C. has been unable to take the Prevacid today because of vomiting. Associated symptoms: nausea and vomiting   Associated symptoms: no chills, no diarrhea and no fever     Past Medical History  Diagnosis Date  . Arthritis   . Attention deficit disorder (ADD)   . Drug abuse     History reviewed. No pertinent past surgical history.  No family history on file.  History  Substance Use Topics  . Smoking status: Current Some Day Smoker -- 0.50 packs/day  . Smokeless tobacco: Never Used  .  Alcohol Use: No    OB History   Grav Para Term Preterm Abortions TAB SAB Ect Mult Living                  Review of Systems  Constitutional: Negative for fever and chills.  HENT: Negative.   Eyes: Negative.   Cardiovascular: Negative.   Gastrointestinal: Positive for nausea, vomiting and abdominal pain. Negative for diarrhea.  Genitourinary: Negative.   Musculoskeletal: Negative.   Skin: Negative.   Neurological: Negative.   Psychiatric/Behavioral: Negative.     Allergies  Review of patient's allergies indicates no known allergies.  Home Medications   Current Outpatient Rx  Name  Route  Sig  Dispense  Refill  . amphetamine-dextroamphetamine (ADDERALL XR) 30 MG 24 hr capsule   Oral   Take 30 mg by mouth every morning.         Marland Kitchen amphetamine-dextroamphetamine (ADDERALL) 10 MG tablet   Oral   Take 10 mg by mouth every evening.         . buprenorphine-naloxone (SUBOXONE) 8-2 MG SUBL   Sublingual   Place 1 tablet under the tongue daily. May take additional doses if needed         . esomeprazole (NEXIUM) 40 MG capsule   Oral   Take 1 capsule (40 mg total) by mouth daily.   30 capsule   0   . glucosamine-chondroitin 500-400 MG tablet   Oral   Take 1 tablet by mouth every morning.         . magnesium  gluconate (MAGONATE) 500 MG tablet   Oral   Take 500 mg by mouth every morning.         . Multiple Vitamin (MULTIVITAMIN WITH MINERALS) TABS   Oral   Take 1 tablet by mouth every morning.          . Omega-3 Fatty Acids (FISH OIL PO)   Oral   Take 1 capsule by mouth every morning.          . predniSONE (DELTASONE) 20 MG tablet   Oral   Take 3 tablets (60 mg total) by mouth daily.   15 tablet   0   . Probiotic Product (PROBIOTIC DAILY PO)   Oral   Take 1 tablet by mouth every morning.          . sucralfate (CARAFATE) 1 G tablet   Oral   Take 1 tablet (1 g total) by mouth 4 (four) times daily.   30 tablet   0     BP 146/90  Pulse 89   Temp(Src) 98.8 F (37.1 C) (Oral)  Resp 18  Ht 5\' 5"  (1.651 m)  Wt 180 lb (81.647 kg)  BMI 29.95 kg/m2  SpO2 100%  Physical Exam  Nursing note and vitals reviewed. Constitutional: She is oriented to person, place, and time. She appears well-developed and well-nourished.  Middle-aged woman in moderate distress, complaining of nausea and vomiting and abdominal pain.  HENT:  Head: Normocephalic and atraumatic.  Right Ear: External ear normal.  Left Ear: External ear normal.  Mouth/Throat: Oropharynx is clear and moist.  Eyes:  Pupils 2 mm, react to light.  Neck: Normal range of motion. Neck supple.  Cardiovascular: Normal rate, regular rhythm and normal heart sounds.   Pulmonary/Chest: Effort normal and breath sounds normal.  Abdominal: Soft. Bowel sounds are normal. Tenderness: she has mild epigastric tenderness, no mass rebound or rigidity.  Musculoskeletal: Normal range of motion. She exhibits no edema and no tenderness.  Neurological: She is alert and oriented to person, place, and time.  No sensory or motor deficit.  Skin: Skin is warm and dry.  Psychiatric: She has a normal mood and affect. Her behavior is normal.    ED Course  Procedures (including critical care time)  Labs Reviewed  BASIC METABOLIC PANEL    7:12 PM Will get BMET to assess for dehydration.  Rx with IV fluids, IV Protonix and Zofran.  8:24 PM Results for orders placed during the hospital encounter of 12/07/12  BASIC METABOLIC PANEL      Result Value Range   Sodium 138  135 - 145 mEq/L   Potassium 3.6  3.5 - 5.1 mEq/L   Chloride 102  96 - 112 mEq/L   CO2 24  19 - 32 mEq/L   Glucose, Bld 99  70 - 99 mg/dL   BUN 7  6 - 23 mg/dL   Creatinine, Ser 1.61  0.50 - 1.10 mg/dL   Calcium 9.2  8.4 - 09.6 mg/dL   GFR calc non Af Amer >90  >90 mL/min   GFR calc Af Amer >90  >90 mL/min   8:24 PM No further vomiting.  Has had nearly one liter of IV fluids.  Will try po liquids.  9:11 PM Tolerated liquids,  crackers.  Will release.  Advised to take her meds, esp the suboxone and adderal, so she does not withdraw.     1. Nausea and vomiting in adult  Carleene Cooper III, MD 12/07/12 2114

## 2012-12-07 NOTE — ED Notes (Signed)
Emesis noted in triage- pt reports she was unable to afford Nexium prescribed yesterday and has vomited all day today.

## 2012-12-21 ENCOUNTER — Ambulatory Visit: Payer: BC Managed Care – PPO | Admitting: Family Medicine

## 2012-12-21 DIAGNOSIS — Z0289 Encounter for other administrative examinations: Secondary | ICD-10-CM

## 2013-03-05 ENCOUNTER — Emergency Department (HOSPITAL_BASED_OUTPATIENT_CLINIC_OR_DEPARTMENT_OTHER)
Admission: EM | Admit: 2013-03-05 | Discharge: 2013-03-05 | Disposition: A | Discharge status: Home / Self Care | Payer: BC Managed Care – PPO | Attending: Emergency Medicine | Admitting: Emergency Medicine

## 2013-03-05 ENCOUNTER — Encounter (HOSPITAL_BASED_OUTPATIENT_CLINIC_OR_DEPARTMENT_OTHER): Payer: Self-pay

## 2013-03-05 DIAGNOSIS — F17200 Nicotine dependence, unspecified, uncomplicated: Secondary | ICD-10-CM | POA: Insufficient documentation

## 2013-03-05 DIAGNOSIS — M129 Arthropathy, unspecified: Secondary | ICD-10-CM | POA: Insufficient documentation

## 2013-03-05 DIAGNOSIS — F988 Other specified behavioral and emotional disorders with onset usually occurring in childhood and adolescence: Secondary | ICD-10-CM | POA: Insufficient documentation

## 2013-03-05 DIAGNOSIS — M542 Cervicalgia: Secondary | ICD-10-CM | POA: Insufficient documentation

## 2013-03-05 DIAGNOSIS — Z79899 Other long term (current) drug therapy: Secondary | ICD-10-CM | POA: Insufficient documentation

## 2013-03-05 DIAGNOSIS — R1110 Vomiting, unspecified: Secondary | ICD-10-CM | POA: Insufficient documentation

## 2013-03-05 DIAGNOSIS — Z8679 Personal history of other diseases of the circulatory system: Secondary | ICD-10-CM | POA: Insufficient documentation

## 2013-03-05 DIAGNOSIS — R51 Headache: Secondary | ICD-10-CM | POA: Insufficient documentation

## 2013-03-05 MED ORDER — DEXAMETHASONE SODIUM PHOSPHATE 10 MG/ML IJ SOLN
10.0000 mg | Freq: Once | INTRAMUSCULAR | Status: AC
  Administered 2013-03-05: 10 mg via INTRAVENOUS
  Filled 2013-03-05: qty 1

## 2013-03-05 MED ORDER — METOCLOPRAMIDE HCL 5 MG/ML IJ SOLN
10.0000 mg | Freq: Once | INTRAMUSCULAR | Status: AC
  Administered 2013-03-05: 10 mg via INTRAVENOUS
  Filled 2013-03-05: qty 2

## 2013-03-05 MED ORDER — DIPHENHYDRAMINE HCL 50 MG/ML IJ SOLN
25.0000 mg | Freq: Once | INTRAMUSCULAR | Status: AC
  Administered 2013-03-05: 25 mg via INTRAVENOUS
  Filled 2013-03-05: qty 1

## 2013-03-05 MED ORDER — SODIUM CHLORIDE 0.9 % IV BOLUS (SEPSIS)
1000.0000 mL | Freq: Once | INTRAVENOUS | Status: AC
  Administered 2013-03-05: 1000 mL via INTRAVENOUS

## 2013-03-05 NOTE — ED Notes (Signed)
Pt reports headache and vomiting that started Friday.

## 2013-03-05 NOTE — ED Provider Notes (Signed)
CSN: [id number]     Arrival date & time 03/05/13  1745 History  This chart was scribed for Autumn Bucco, MD by Bennett Scrape, ED Scribe. This patient was seen in room MH11/MH11 and the patient's care was started at 5:48 PM.   First MD Initiated Contact with Patient 03/05/13 1745     Chief Complaint  Patient presents with  . Headache  . Emesis    Patient is a 57 y.o. female presenting with headaches. The history is provided by the patient. No language interpreter was used.  Headache Pain location:  Frontal Radiates to:  Does not radiate Onset quality:  Gradual Duration:  2 days Timing:  Constant Similar to prior headaches: yes   Associated symptoms: vomiting   Associated symptoms: no back pain, no congestion, no cough, no dizziness, no fatigue, no fever, no nausea and no numbness     HPI Comments: Autumn [name] is a 57 y.o. female who presents to the Emergency Department complaining of a gradual onset HA with persistent associated emesis that started 2 days ago. She reports that the HA started before the emesis in the frontal region. She states that she has a h/o migraines and admits that this HA feels similar. She reports that she had experienced mild left-sided neck pain worse with movement during the initial onset that has since resolved. . She denies weakness and new numbness to her extremities as associated symptoms. She denies any recent falls or head trauma. She denies alcohol or drug use.   Past Medical History  Diagnosis Date  . Arthritis   . Attention deficit disorder (ADD)   . Drug abuse    Past Surgical History  Procedure Laterality Date  . Ankle surgery    . Facial cosmetic surgery     No family history on file. History  Substance Use Topics  . Smoking status: Current Some Day Smoker -- 0.50 packs/day    Types: Cigarettes  . Smokeless tobacco: Never Used  . Alcohol Use: No   No OB history provided.  Review of Systems  Constitutional: Negative for  fever, chills, diaphoresis and fatigue.  HENT: Negative for congestion, rhinorrhea and sneezing.   Eyes: Negative.   Respiratory: Negative for cough, chest tightness and shortness of breath.   Cardiovascular: Negative for chest pain and leg swelling.  Gastrointestinal: Positive for vomiting. Negative for nausea and blood in stool.  Genitourinary: Negative for frequency, hematuria, flank pain and difficulty urinating.  Musculoskeletal: Negative for back pain and arthralgias.  Skin: Negative for rash.  Neurological: Positive for headaches. Negative for dizziness, speech difficulty, weakness and numbness.    Allergies  Review of patient's allergies indicates no known allergies.  Home Medications   Current Outpatient Rx  Name  Route  Sig  Dispense  Refill  . amphetamine-dextroamphetamine (ADDERALL XR) 30 MG 24 hr capsule   Oral   Take 30 mg by mouth every morning.         Marland Kitchen amphetamine-dextroamphetamine (ADDERALL) 10 MG tablet   Oral   Take 10 mg by mouth every evening.         . buprenorphine-naloxone (SUBOXONE) 8-2 MG SUBL   Sublingual   Place 1 tablet under the tongue daily. May take additional doses if needed         . esomeprazole (NEXIUM) 40 MG capsule   Oral   Take 1 capsule (40 mg total) by mouth daily.   30 capsule   0   . glucosamine-chondroitin 500-400 MG  tablet   Oral   Take 1 tablet by mouth every morning.         . magnesium gluconate (MAGONATE) 500 MG tablet   Oral   Take 500 mg by mouth every morning.         . Multiple Vitamin (MULTIVITAMIN WITH MINERALS) TABS   Oral   Take 1 tablet by mouth every morning.          . Omega-3 Fatty Acids (FISH OIL PO)   Oral   Take 1 capsule by mouth every morning.          . Probiotic Product (PROBIOTIC DAILY PO)   Oral   Take 1 tablet by mouth every morning.           Triage Vitals: BP 154/100  Pulse 106  Temp(Src) 98.5 F (36.9 C) (Oral)  Resp 18  Ht 5\' 5"  (1.651 m)  Wt 180 lb (81.647 kg)   BMI 29.95 kg/m2  SpO2 100%  Physical Exam  Nursing note and vitals reviewed. Constitutional: She is oriented to person, place, and time. She appears well-developed and well-nourished.  HENT:  Head: Normocephalic and atraumatic.  Eyes: Pupils are equal, round, and reactive to light.  Neck: Normal range of motion. Neck supple.  No meningismus  Cardiovascular: Normal rate, regular rhythm and normal heart sounds.   Pulmonary/Chest: Effort normal and breath sounds normal. No respiratory distress. She has no wheezes. She has no rales. She exhibits no tenderness.  Abdominal: Soft. Bowel sounds are normal. There is no tenderness. There is no rebound and no guarding.  Musculoskeletal: Normal range of motion. She exhibits no edema.  Lymphadenopathy:    She has no cervical adenopathy.  Neurological: She is alert and oriented to person, place, and time.  Motor and sensation are intact  Skin: Skin is warm and dry. No rash noted.  Psychiatric: She has a normal mood and affect.    ED Course   Procedures (including critical care time)  Medications  sodium chloride 0.9 % bolus 1,000 mL (1,000 mLs Intravenous New Bag/Given 03/05/13 1800)  diphenhydrAMINE (BENADRYL) injection 25 mg (25 mg Intravenous Given 03/05/13 1800)  dexamethasone (DECADRON) injection 10 mg (10 mg Intravenous Given 03/05/13 1800)  metoCLOPramide (REGLAN) injection 10 mg (10 mg Intravenous Given 03/05/13 1800)    DIAGNOSTIC STUDIES: Oxygen Saturation is 100% on room air, normal by my interpretation.    COORDINATION OF CARE: 5:53 PM-Discussed treatment plan which includes migraine cocktail with pt at bedside and pt agreed to plan.   Labs Reviewed - No data to display No results found. 1. Headache     MDM  Patient symptoms are consistent with her past migraines. She was given a migraine cocktail here and says her headache is completely resolved after this. She's ready to go home. I don't see any symptoms suggestive of  subarachnoid hemorrhage or meningitis. Advised her followup with her primary care physician if her symptoms aren't improving or return here as needed for any worsening symptoms.  I personally performed the services described in this documentation, which was scribed in my presence.  The recorded information has been reviewed and considered.    Autumn Bucco, MD 03/05/13 [phone]

## 2014-03-17 IMAGING — US US ABDOMEN COMPLETE
1 series · 14 of 25 positions shown · non-contrast
Comparison: 06/01/2006 CT.

CLINICAL DATA: Epigastric pain.  History of alcohol abuse.

COMPLETE ABDOMINAL ULTRASOUND

[Series 1: us abdomen complete · 0.32mm/px · 14 of 61 slices shown]
[im 1/61]
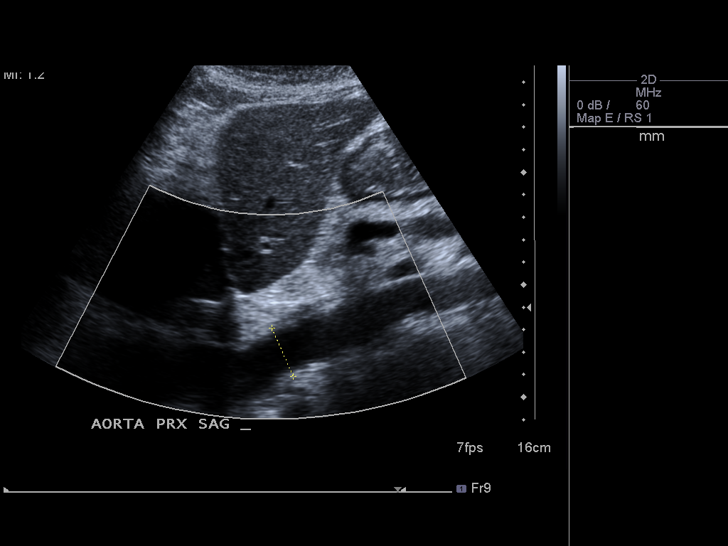
[im 6/61]
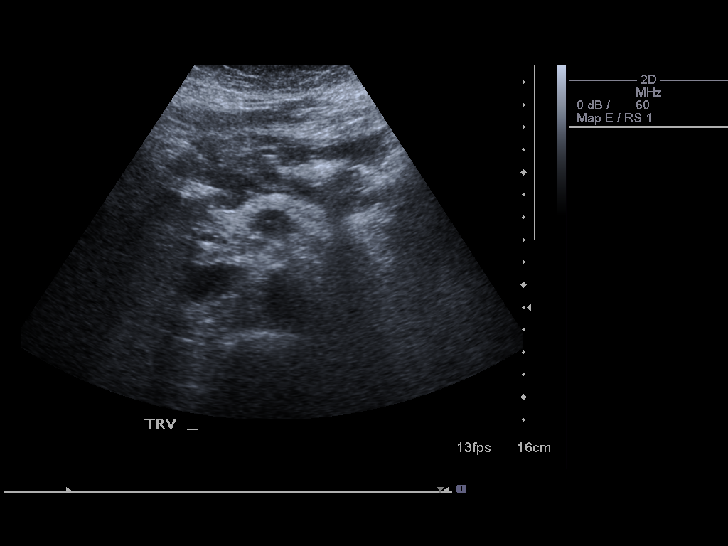
[im 11/61]
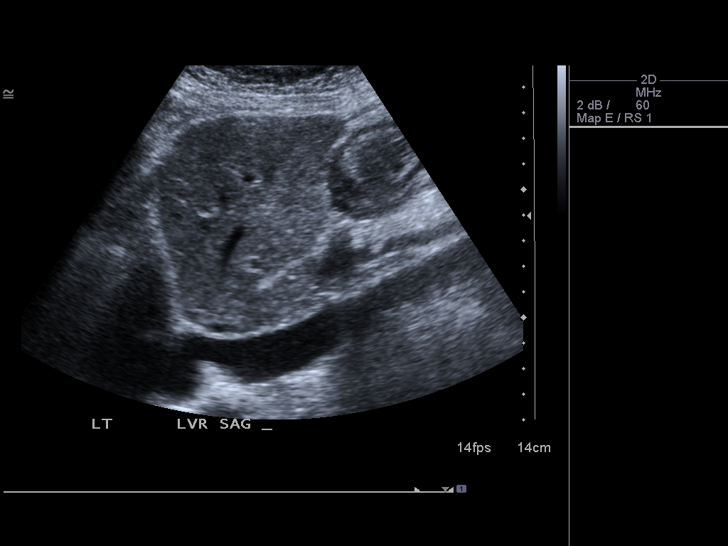
[im 16/61]
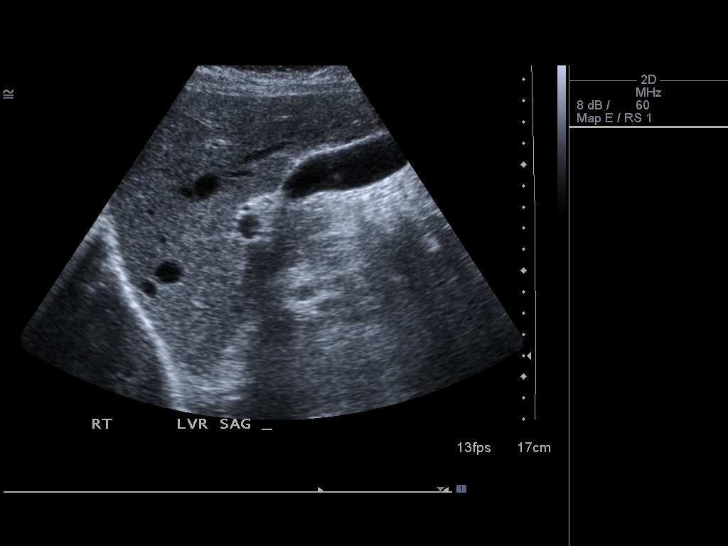
[im 21/61]
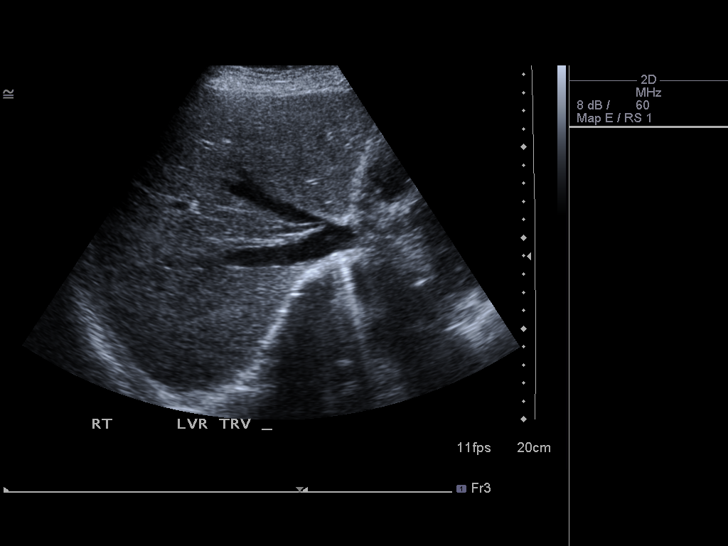
[im 23/61]
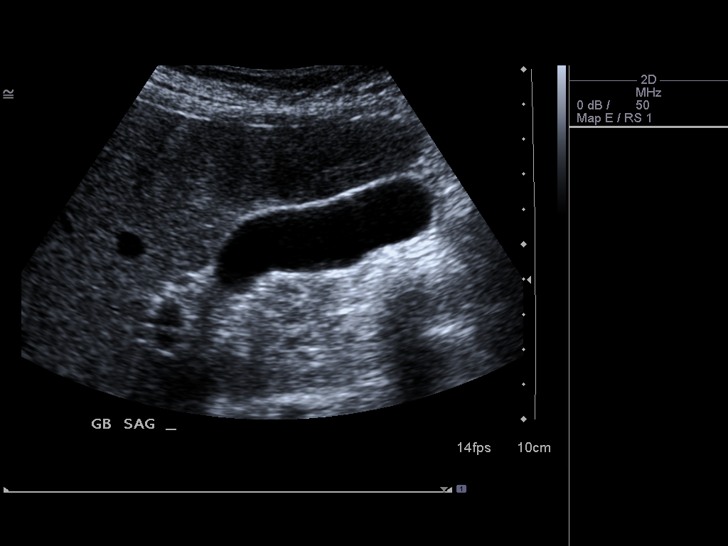
[im 28/61]
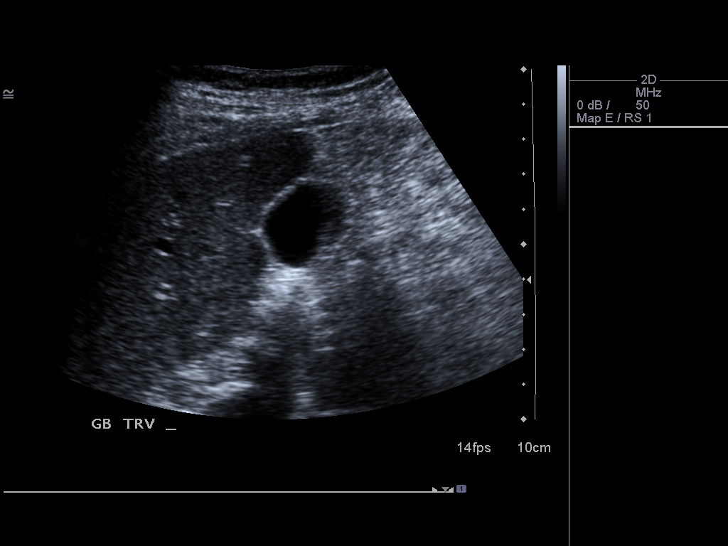
[im 33/61]
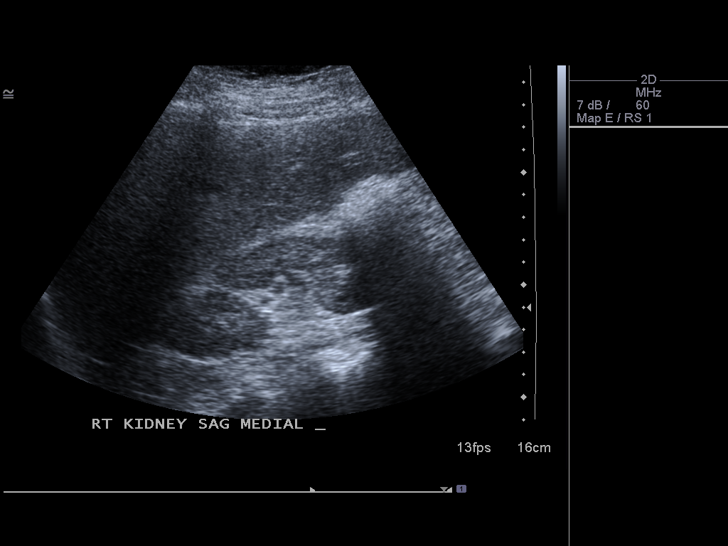
[im 38/61]
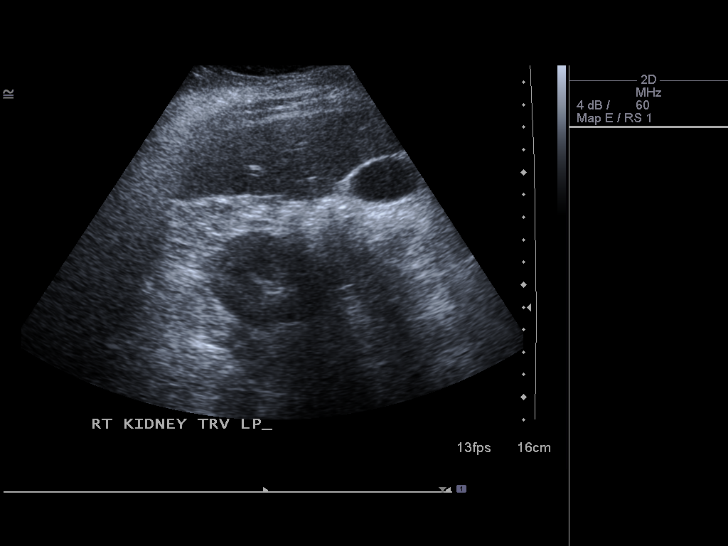
[im 41/61]
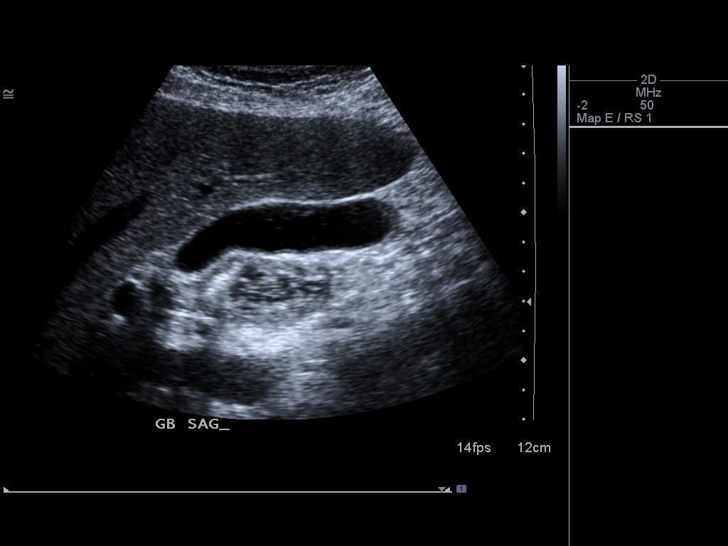
[im 46/61]
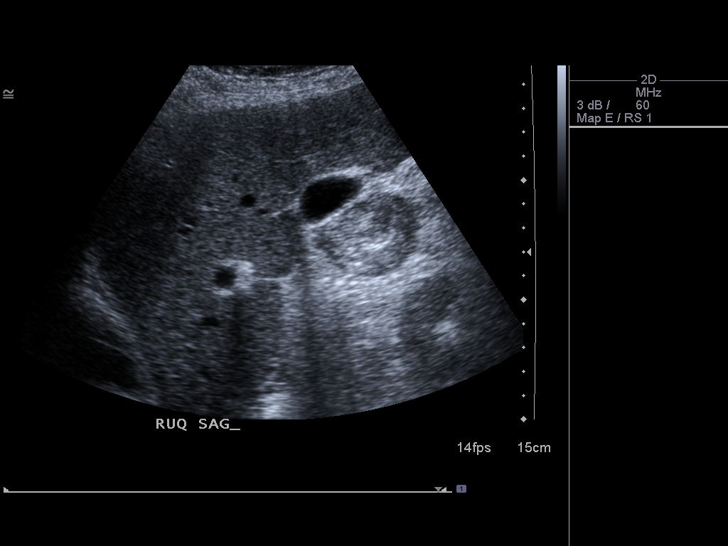
[im 51/61]
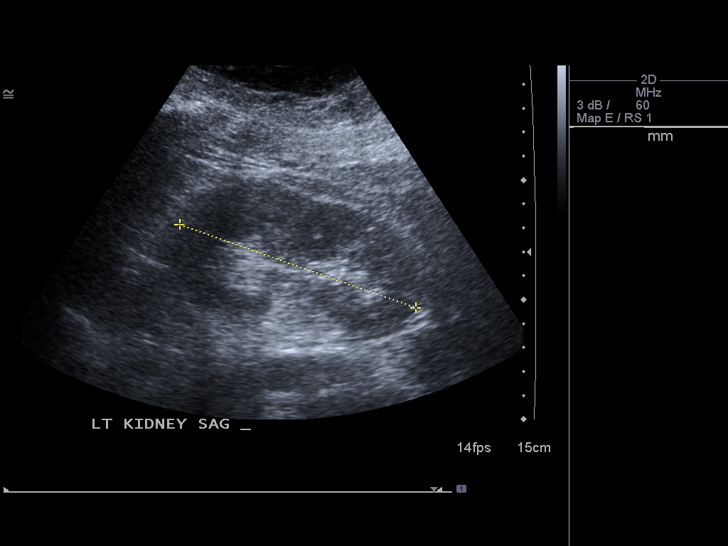
[im 56/61]
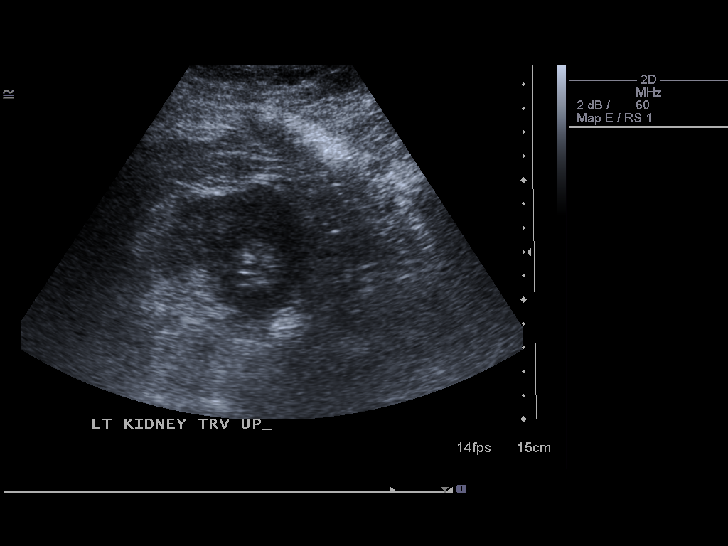
[im 61/61]
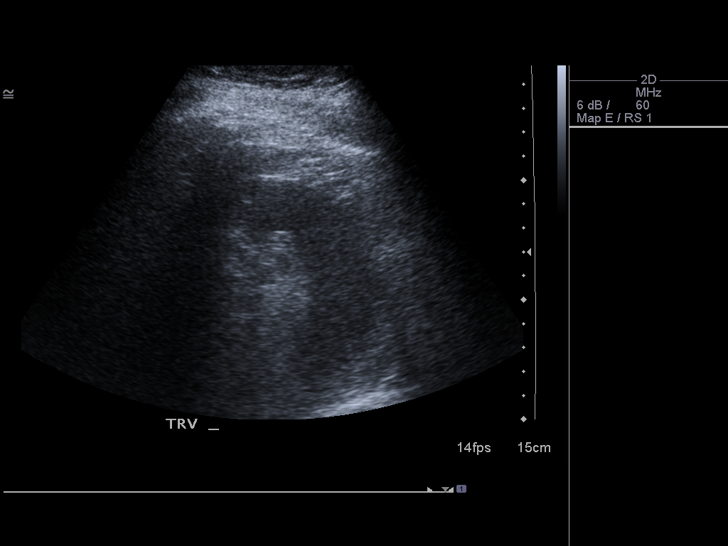

[14 of 25 positions shown; findings below may reference images not displayed]

FINDINGS: Gallbladder:  No gallstones, gallbladder wall thickening, or
pericholecystic fluid.

Common bile duct:  6.2 mm maximal dimension.  Distal aspect not
visualized secondary to bowel gas.

Liver:  No focal lesion identified.  Within normal limits in
parenchymal echogenicity.

IVC:  Limited evaluation secondary to bowel gas.

Pancreas:  Limited evaluation secondary to bowel gas.

Spleen:  4.6 cm.  No focal mass.

Right Kidney:  11.3 cm. No hydronephrosis or renal mass..

Left Kidney:  10.5 cm. No hydronephrosis or renal mass..

Abdominal aorta:  Limited evaluation secondary to bowel gas.
IMPRESSION: Secondary to bowel gas, limited evaluation of the inferior vena
cava, pancreas and aorta.

No gallstones.

Mild prominence mid common bile duct measuring up to 6.2 mm.
Distal aspect not visualized secondary to bowel gas.

## 2015-09-29 ENCOUNTER — Emergency Department (HOSPITAL_COMMUNITY): Payer: Self-pay

## 2015-09-29 ENCOUNTER — Encounter (HOSPITAL_COMMUNITY): Payer: Self-pay | Admitting: Emergency Medicine

## 2015-09-29 ENCOUNTER — Emergency Department (HOSPITAL_COMMUNITY)
Admission: EM | Admit: 2015-09-29 | Discharge: 2015-09-29 | Disposition: A | Discharge status: Home / Self Care | Payer: Self-pay | Attending: Physician Assistant | Admitting: Physician Assistant

## 2015-09-29 DIAGNOSIS — F17210 Nicotine dependence, cigarettes, uncomplicated: Secondary | ICD-10-CM | POA: Insufficient documentation

## 2015-09-29 DIAGNOSIS — F909 Attention-deficit hyperactivity disorder, unspecified type: Secondary | ICD-10-CM | POA: Insufficient documentation

## 2015-09-29 DIAGNOSIS — F1210 Cannabis abuse, uncomplicated: Secondary | ICD-10-CM | POA: Insufficient documentation

## 2015-09-29 DIAGNOSIS — R1012 Left upper quadrant pain: Secondary | ICD-10-CM | POA: Insufficient documentation

## 2015-09-29 DIAGNOSIS — F1410 Cocaine abuse, uncomplicated: Secondary | ICD-10-CM | POA: Insufficient documentation

## 2015-09-29 DIAGNOSIS — R630 Anorexia: Secondary | ICD-10-CM | POA: Insufficient documentation

## 2015-09-29 DIAGNOSIS — Z79899 Other long term (current) drug therapy: Secondary | ICD-10-CM | POA: Insufficient documentation

## 2015-09-29 DIAGNOSIS — M1990 Unspecified osteoarthritis, unspecified site: Secondary | ICD-10-CM | POA: Insufficient documentation

## 2015-09-29 DIAGNOSIS — R110 Nausea: Secondary | ICD-10-CM | POA: Insufficient documentation

## 2015-09-29 LAB — COMPREHENSIVE METABOLIC PANEL
ALBUMIN: 3.2 g/dL — AB (ref 3.5–5.0)
ALT: 52 U/L (ref 14–54)
AST: 43 U/L — AB (ref 15–41)
Alkaline Phosphatase: 63 U/L (ref 38–126)
Anion gap: 13 (ref 5–15)
BILIRUBIN TOTAL: 0.7 mg/dL (ref 0.3–1.2)
CHLORIDE: 96 mmol/L — AB (ref 101–111)
CO2: 22 mmol/L (ref 22–32)
CREATININE: 0.6 mg/dL (ref 0.44–1.00)
Calcium: 8.5 mg/dL — ABNORMAL LOW (ref 8.9–10.3)
GFR calc Af Amer: >60 mL/min (ref >60)
GLUCOSE: 123 mg/dL — AB (ref 65–99)
POTASSIUM: 3.2 mmol/L — AB (ref 3.5–5.1)
Sodium: 131 mmol/L — ABNORMAL LOW (ref 135–145)
Total Protein: 7.1 g/dL (ref 6.5–8.1)

## 2015-09-29 LAB — RAPID URINE DRUG SCREEN, HOSP PERFORMED
Amphetamines: NOT DETECTED
BARBITURATES: NOT DETECTED
Benzodiazepines: NOT DETECTED
Cocaine: POSITIVE — AB
Opiates: NOT DETECTED
TETRAHYDROCANNABINOL: POSITIVE — AB

## 2015-09-29 LAB — CBC WITH DIFFERENTIAL/PLATELET
BASOS ABS: 0 10*3/uL (ref 0.0–0.1)
Basophils Relative: 0 %
EOS PCT: 1 %
Eosinophils Absolute: 0 10*3/uL (ref 0.0–0.7)
HEMATOCRIT: 43.1 % (ref 36.0–46.0)
Hemoglobin: 14.5 g/dL (ref 12.0–15.0)
LYMPHS PCT: 33 %
Lymphs Abs: 1.9 10*3/uL (ref 0.7–4.0)
MCH: 31 pg (ref 26.0–34.0)
MCHC: 33.6 g/dL (ref 30.0–36.0)
MCV: 92.1 fL (ref 78.0–100.0)
MONO ABS: 0.3 10*3/uL (ref 0.1–1.0)
MONOS PCT: 6 %
NEUTROS ABS: 3.5 10*3/uL (ref 1.7–7.7)
Neutrophils Relative %: 60 %
PLATELETS: 253 10*3/uL (ref 150–400)
RBC: 4.68 MIL/uL (ref 3.87–5.11)
RDW: 14.1 % (ref 11.5–15.5)
WBC: 5.7 10*3/uL (ref 4.0–10.5)

## 2015-09-29 LAB — LIPASE, BLOOD: LIPASE: 22 U/L (ref 11–51)

## 2015-09-29 LAB — TROPONIN I: Troponin I: <0.03 ng/mL (ref <0.031)

## 2015-09-29 LAB — ETHANOL: ALCOHOL ETHYL (B): 7 mg/dL — AB (ref <5)

## 2015-09-29 MED ORDER — PANTOPRAZOLE SODIUM 20 MG PO TBEC: 20.0000 mg | DELAYED_RELEASE_TABLET | Freq: Every day | ORAL | Status: DC

## 2015-09-29 MED ORDER — LORAZEPAM 1 MG PO TABS
1.0000 mg | ORAL_TABLET | Freq: Once | ORAL | Status: AC
  Administered 2015-09-29: 1 mg via ORAL
  Filled 2015-09-29: qty 1

## 2015-09-29 MED ORDER — ALUM & MAG HYDROXIDE-SIMETH 200-200-20 MG/5ML PO SUSP: 15.0000 mL | Freq: Four times a day (QID) | ORAL | Status: DC | PRN

## 2015-09-29 MED ORDER — GI COCKTAIL ~~LOC~~
30.0000 mL | Freq: Once | ORAL | Status: AC
  Administered 2015-09-29: 30 mL via ORAL
  Filled 2015-09-29: qty 30

## 2015-09-29 NOTE — ED Notes (Signed)
Pt declines having discharge instructions explained to her; pt states she "can read it myself"; pt signed for discharge and was escorted to lobby with [employer] and hospital security; pt ambulatory; NAD

## 2015-09-29 NOTE — ED Notes (Signed)
Patient yelling in room and was heard at the nurses station by several staff members verbalizing she is dissatisfied with her care today and that she "cannot go back on the street like this"; pt also stated that she "is homeless"; pt also stated that she is "just going to call and ambulance and go to [facility]"; Security called at this time

## 2015-09-29 NOTE — ED Notes (Signed)
Patient transported to X-ray 

## 2015-09-29 NOTE — ED Provider Notes (Signed)
CSN: [id number]     Arrival date & time 09/29/15  1555 History   First MD Initiated Contact with Patient 09/29/15 1555     Chief Complaint  Patient presents with  . Abdominal Pain   Patient is a 60 y.o. female presenting with abdominal pain. The history is provided by the patient.  Abdominal Pain Pain location:  LUQ Pain quality: bloating and fullness   Pain quality: not sharp, not shooting, not squeezing and no stiffness   Pain radiates to:  Does not radiate Pain severity:  Moderate Onset quality:  Gradual Duration:  3 days Timing:  Constant Progression:  Unchanged Chronicity:  Recurrent Context: alcohol use   Context: not diet changes, not previous surgeries, not recent illness and not sick contacts   Relieved by:  None tried Worsened by:  Nothing tried Ineffective treatments:  None tried Associated symptoms: anorexia and nausea   Associated symptoms: no chest pain, no chills, no constipation, no cough, no diarrhea, no fever, no hematuria, no shortness of breath, no sore throat and no vomiting   Risk factors: alcohol abuse     Past Medical History  Diagnosis Date  . Arthritis   . Attention deficit disorder (ADD)   . Drug abuse    Past Surgical History  Procedure Laterality Date  . Ankle surgery    . Facial cosmetic surgery     History reviewed. No pertinent family history. Social History  Substance Use Topics  . Smoking status: Current Some Day Smoker -- 0.50 packs/day    Types: Cigarettes  . Smokeless tobacco: Never Used  . Alcohol Use: Yes     Comment: A fifth of vodka a day   OB History    No data available     Review of Systems  Constitutional: Negative for fever, chills, activity change and appetite change.  HENT: Negative for congestion, dental problem, ear pain, facial swelling, hearing loss, rhinorrhea, sneezing, sore throat, trouble swallowing and voice change.   Eyes: Negative for photophobia, pain, redness and visual disturbance.  Respiratory:  Negative for apnea, cough, chest tightness, shortness of breath, wheezing and stridor.   Cardiovascular: Negative for chest pain, palpitations and leg swelling.  Gastrointestinal: Positive for nausea, abdominal pain and anorexia. Negative for vomiting, diarrhea, constipation, blood in stool and abdominal distention.  Endocrine: Negative for polydipsia and polyuria.  Genitourinary: Negative for frequency, hematuria, flank pain, decreased urine volume and difficulty urinating.  Musculoskeletal: Negative for back pain, joint swelling, gait problem, neck pain and neck stiffness.  Skin: Negative for rash and wound.  Allergic/Immunologic: Negative for immunocompromised state.  Neurological: Negative for dizziness, syncope, facial asymmetry, speech difficulty, weakness, light-headedness, numbness and headaches.  Hematological: Negative for adenopathy.  Psychiatric/Behavioral: Negative for suicidal ideas, behavioral problems, confusion, sleep disturbance and agitation. The patient is not nervous/anxious.   All other systems reviewed and are negative.     Allergies  Review of patient's allergies indicates no known allergies.  Home Medications   Prior to Admission medications   Medication Sig Start Date End Date Taking? Authorizing Provider  alum & mag hydroxide-simeth (MAALOX REGULAR STRENGTH) 200-200-20 MG/5ML suspension Take 15 mLs by mouth every 6 (six) hours as needed for indigestion or heartburn. 09/29/15   Dan Humphreys, MD  amphetamine-dextroamphetamine (ADDERALL XR) 30 MG 24 hr capsule Take 30 mg by mouth every morning.    Historical Provider, MD  amphetamine-dextroamphetamine (ADDERALL) 10 MG tablet Take 10 mg by mouth every evening.    Historical Provider, MD  buprenorphine-naloxone (  SUBOXONE) 8-2 MG SUBL Place 1 tablet under the tongue daily. May take additional doses if needed    Historical Provider, MD  esomeprazole (NEXIUM) 40 MG capsule Take 1 capsule (40 mg total) by mouth daily.  12/06/12   Rolan Bucco, MD  glucosamine-chondroitin 500-400 MG tablet Take 1 tablet by mouth every morning.    Historical Provider, MD  magnesium gluconate (MAGONATE) 500 MG tablet Take 500 mg by mouth every morning.    Historical Provider, MD  Multiple Vitamin (MULTIVITAMIN WITH MINERALS) TABS Take 1 tablet by mouth every morning.     Historical Provider, MD  Omega-3 Fatty Acids (FISH OIL PO) Take 1 capsule by mouth every morning.     Historical Provider, MD  pantoprazole (PROTONIX) 20 MG tablet Take 1 tablet (20 mg total) by mouth daily. 09/29/15   Dan Humphreys, MD  Probiotic Product (PROBIOTIC DAILY PO) Take 1 tablet by mouth every morning.     Historical Provider, MD   BP 141/92 mmHg  Pulse 83  Temp(Src) 99.1 F (37.3 C) (Oral)  Resp 18  SpO2 94% Physical Exam  Constitutional: She is oriented to person, place, and time. She appears well-developed and well-nourished. No distress.  HENT:  Head: Normocephalic and atraumatic.  Right Ear: External ear normal.  Left Ear: External ear normal.  Eyes: Pupils are equal, round, and reactive to light. Right eye exhibits no discharge. Left eye exhibits no discharge.  Neck: Normal range of motion. No JVD present. No tracheal deviation present.  Cardiovascular: Normal rate, regular rhythm and normal heart sounds.  Exam reveals no friction rub.   No murmur heard. Pulmonary/Chest: Effort normal and breath sounds normal. No stridor. No respiratory distress. She has no wheezes.  Abdominal: Soft. Bowel sounds are normal. She exhibits no distension. There is no rebound and no guarding.  Musculoskeletal: Normal range of motion. She exhibits no edema or tenderness.  Lymphadenopathy:    She has no cervical adenopathy.  Neurological: She is alert and oriented to person, place, and time. No cranial nerve deficit. Coordination normal.  Skin: Skin is warm and dry. No rash noted. No pallor.  Psychiatric: She has a normal mood and affect. Her behavior is  normal. Judgment and thought content normal.  Nursing note and vitals reviewed.   ED Course  Procedures (including critical care time) Labs Review Labs Reviewed  COMPREHENSIVE METABOLIC PANEL - Abnormal; Notable for the following:    Sodium 131 (*)    Potassium 3.2 (*)    Chloride 96 (*)    Glucose, Bld 123 (*)    BUN <5 (*)    Calcium 8.5 (*)    Albumin 3.2 (*)    AST 43 (*)    All other components within normal limits  URINE RAPID DRUG SCREEN, HOSP PERFORMED - Abnormal; Notable for the following:    Cocaine POSITIVE (*)    Tetrahydrocannabinol POSITIVE (*)    All other components within normal limits  ETHANOL - Abnormal; Notable for the following:    Alcohol, Ethyl (B) 7 (*)    All other components within normal limits  LIPASE, BLOOD  CBC WITH DIFFERENTIAL/PLATELET  TROPONIN I    Imaging Review Dg Chest 2 View  09/29/2015  CLINICAL DATA:  Midsternal chest heaviness and congestion for a couple of days. No known injury. Initial encounter. EXAM: CHEST  2 VIEW COMPARISON:  PA and lateral chest 09/08/2007. FINDINGS: The chest is hyperexpanded with attenuation of the pulmonary vasculature. The lungs are clear. Heart size  is normal. No pneumothorax or pleural effusion. IMPRESSION: No acute disease. COPD. Electronically Signed   By: Drusilla Kanner M.D.   On: 09/29/2015 16:57   I have personally reviewed and evaluated these images and lab results as part of my medical decision-making.   EKG Interpretation None      MDM   Final diagnoses:  Left upper quadrant pain    Patient with history of hiatal hernia presents via EMS for evaluation of left upper quadrant fullness, nausea. This is been present for 3 days. Worse after lifting heavy box. She endorses daily alcohol use in the past 3 months, a fifth of vodka per day. She denies any chest pain, shortness of breath.  Upon arrival patient afebrile no acute distress. She has a nontender abdomen.  This is likely pain from her  hiatal hernia versus gastritis. CMP unremarkable, lipase normal, CBC normal, troponin negative. Alcohol negative patient cocaine positive and marijuana positive.  Chest x-ray normal.  EKG with normal sinus rhythm, no ischemic changes.  Patient given Ativan, GI cocktail with improvement in her symptoms.  Patient given prescription for protonix and Maalox at home. She was given information for resources for alcohol and drug dependence.  Patient accompanied by friend at time of discharge.   Discussed with Dr. Corlis Leak.      Dan Humphreys, MD 09/29/15 2357  Courteney Randall An, MD 09/30/15 1610

## 2015-09-29 NOTE — ED Notes (Addendum)
Pt from home via [facility] with c/o LUQ pain since this past Friday.  Pt reports hx of hiatal hernia and then lifting a box this past Friday which was too heavy.  Pt states she is nauseated, feels full, and can see the hernia larger than previously.  Pt was medication with ETOH and soboxone.  Pt requesting detox assistance as well.  Reports last drink at 0330 today.  Pt drinks approx a fifth of vodka a day.  NAD, A&O.

## 2015-09-29 NOTE — Discharge Instructions (Signed)
Abdominal Pain, Adult °Many things can cause abdominal pain. Usually, abdominal pain is not caused by a disease and will improve without treatment. It can often be observed and treated at home. Your health care provider will do a physical exam and possibly order blood tests and X-rays to help determine the seriousness of your pain. However, in many cases, more time must pass before a clear cause of the pain can be found. Before that point, your health care provider may not know if you need more testing or further treatment. °HOME CARE INSTRUCTIONS °Monitor your abdominal pain for any changes. The following actions may help to alleviate any discomfort you are experiencing: °· Only take over-the-counter or prescription medicines as directed by your health care provider. °· Do not take laxatives unless directed to do so by your health care provider. °· Try a clear liquid diet (broth, tea, or water) as directed by your health care provider. Slowly move to a bland diet as tolerated. °SEEK MEDICAL CARE IF: °· You have unexplained abdominal pain. °· You have abdominal pain associated with nausea or diarrhea. °· You have pain when you urinate or have a bowel movement. °· You experience abdominal pain that wakes you in the night. °· You have abdominal pain that is worsened or improved by eating food. °· You have abdominal pain that is worsened with eating fatty foods. °· You have a fever. °SEEK IMMEDIATE MEDICAL CARE IF: °· Your pain does not go away within 2 hours. °· You keep throwing up (vomiting). °· Your pain is felt only in portions of the abdomen, such as the right side or the left lower portion of the abdomen. °· You pass bloody or black tarry stools. °MAKE SURE YOU: °· Understand these instructions. °· Will watch your condition. °· Will get help right away if you are not doing well or get worse. °  °This information is not intended to replace advice given to you by your health care provider. Make sure you discuss  any questions you have with your health care provider. °  °Document Released: 05/06/2005 Document Revised: 04/17/2015 Document Reviewed: 04/05/2013 °Elsevier Interactive Patient Education ©2016 Elsevier Inc. ° ° °Emergency Department Resource Guide °1) Find a Doctor and Pay Out of Pocket °Although you won't have to find out who is covered by your insurance plan, it is a good idea to ask around and get recommendations. You will then need to call the office and see if the doctor you have chosen will accept you as a new patient and what types of options they offer for patients who are self-pay. Some doctors offer discounts or will set up payment plans for their patients who do not have insurance, but you will need to ask so you aren't surprised when you get to your appointment. ° °2) Contact Your Local Health Department °Not all health departments have doctors that can see patients for sick visits, but many do, so it is worth a call to see if yours does. If you don't know where your local health department is, you can check in your phone book. The CDC also has a tool to help you locate your state's health department, and many state websites also have listings of all of their local health departments. ° °3) Find a Walk-in Clinic °If your illness is not likely to be very severe or complicated, you may want to try a walk in clinic. These are popping up all over the country in pharmacies, drugstores, and shopping centers.   They're usually staffed by nurse practitioners or physician assistants that have been trained to treat common illnesses and complaints. They're usually fairly quick and inexpensive. However, if you have serious medical issues or chronic medical problems, these are probably not your best option. ° °No Primary Care Doctor: °- Call [facility] at  832-8000 - they can help you locate a primary care doctor that  accepts your insurance, provides certain services, etc. °- Physician Referral Service-  1-800-533-3463 ° °Chronic Pain Problems: °Organization         Address  Phone   Notes  °[facility]  (336) 297-2271 Patients need to be referred by their primary care doctor.  ° °Medication Assistance: °Organization         Address  Phone   Notes  °[place] [facility] [address] °[place], [place] [address] (336) 641-8030 --Must be a resident of [place] °-- Must have NO insurance coverage whatsoever (no Medicaid/ Medicare, etc.) °-- The pt. MUST have a primary care doctor that directs their care regularly and follows them in the community °  °[facility]  (866) 331-1348   °[employer]  (888) 892-1162   ° °Agencies that provide inexpensive medical care: °Organization         Address  Phone   Notes  °[facility]  (336) 832-8035   °[facility]    (336) 832-7272   °[facility] [address] °[place], [place] [address] (336) 832-4777   °[facility] [address], °[place] (336) 271-4999   °[facility]    (336) 373-0678   °[facility]    (336) 272-1050   °[facility] ° [address], [place] Phone:  (336) 832-4444, Fax:  (336) 832-4440 Hours of Operation:  9 am - 6 pm, M-F.  Also accepts Medicaid/Medicare and self-pay.  °[facility] ° [address], [place] Phone: (336) 832-3150, Fax: (336) [phone]. Hours of Operation:  8:30 am - 5:30 pm, M-F.  Also accepts Medicaid and self-pay.  °[facility] [place] [address], [place] Phone: (336) 878-6027   °[facility] [address], [place], [place] (336)723-1848, Ext. 123 Mondays & Thursdays: 7-9 AM.  First 15 patients are seen on a first come, first serve basis. °  ° °Medicaid-accepting [place] Providers: ° °Organization         Address  Phone   Notes  °[facility] [address],  [place] (336) 641-2100 Also accepts self-pay patients.  °[facility] [address], [place] ° (336) 856-9996   °[facility] [address], [place] (336) 288-8857   °[facility] [address] [place] [address], [place] (336) 299-7000   °Veita Bland [address], [place]  ° (336) [phone] Only accepts [place] Access Medicaid patients after they have their name applied to their card.  ° °Self-Pay (no insurance) in [place]: ° °Organization         Address  Phone   Notes  °Sickle Cell Patients, [facility] [address], [place] (336) 832-1970   °[facility] [address], [place] (336) 832-4400   °[facility] ° [address], [place] (336) 992-4800   °  [facility]/Dr. Osei-Bonsu ° [address] [place] [address], [place] or [address], [place] (336) [phone] Phone number for both [place] and [place] locations is the same.  °[facility] [address], [place] (336) 299-0000   °[facility] [address] [place] [address], [place] or [address] (336) 852-7530 °(336) 878-2260   °[facility] [address], [place] (336) 350-1642, phone; (336) 294-5005, fax Sees patients 1st and 3rd Saturday of every month.  Must not qualify for public or private insurance (i.e. Medicaid, Medicare, [place] Health Choice, Veterans' Benefits) • Household income should be no more than 200% of the poverty level •The clinic cannot treat you if you are pregnant or think you are pregnant • Sexually transmitted diseases are not treated at the clinic.  ° ° °Dental Care: °Organization         Address  Phone  Notes  °[facility] [address], [place] (336) 641-6152 Accepts children up to age 21 who are enrolled in  Medicaid or [place] Health Choice; pregnant women with a Medicaid card; and children who have applied for Medicaid or [place] Health Choice, but were declined, whose parents can pay a reduced fee at time of service.  °[facility] [place]  [address], [place] (336) 641-7733 Accepts children up to age 21 who are enrolled in Medicaid or [place] Health Choice; pregnant women with a Medicaid card; and children who have applied for Medicaid or [place] Health Choice, but were declined, whose parents can pay a reduced fee at time of service.  °[facility] ° [address], [place] (336) 641-4533 Patients are seen by appointment only. Walk-ins are not accepted. [facility] will see patients 18 years of age and older. °Monday - Tuesday (8am-5pm) °Most Wednesdays (8:30-5pm) °$30 per visit, cash only  °[facility] ° [address], [place] (336) 641-4533 Patients are seen by appointment only. Walk-ins are not accepted. [facility] will see patients 18 years of age and older. °One Wednesday Evening (Monthly: Volunteer Based).  $30 per visit, cash only  °[facility]  (919) 537-3737 for adults; Children under age 4, call Graduate Pediatric Dentistry at ([phone]) [phone]. Children aged 4-14, please call (919) [phone] to request a pediatric application. ° Dental services are provided in all areas of dental care including fillings, crowns and bridges, complete and partial dentures, implants, gum treatment, root canals, and extractions. Preventive care is also provided. Treatment is provided to both adults and children. °Patients are selected via a lottery and there is often a waiting list. °  °[facility] [address], °[place] ° (336) 763-8833 [email or web address] °  °[facility] [address], [place], [place] (336)723-1848, Ext. 123 Second and Fourth Thursday of each month, opens at 6:30  AM; Clinic ends at 9 AM.  Patients are seen on a first-come first-served basis, and a limited number are seen during each clinic.  ° °[facility] ° [address], [place], [place] (336) 723-7904   Eligibility Requirements °You must have lived in [place], [place], or [place] counties for at least the last three months. °  You cannot be eligible for state or federal sponsored healthcare insurance, including Veterans Administration, Medicaid, or Medicare. °  You generally cannot be eligible for healthcare insurance   through your employer.  °  How to apply: °Eligibility screenings are held every Tuesday and Wednesday afternoon from 1:00 pm until 4:00 pm. You do not need an appointment for the interview!  °[facility] [address], [place], [place] 336-631-2330   °[facility]  336-342-8273   °[facility]  336-703-3100   °[facility]  336-570-6415   ° °Behavioral Health Resources in the Community: °Intensive Outpatient Programs °Organization         Address  Phone  Notes  °[facility] [address], [place], [place] 336-878-6098   °[facility] [address], [place], [place] 336-832-9800   °[facility] [address], [place], [place] ° 336-882-2125   °[facility] [address],  °[place], [place] 1-800-853-5163 or 336-641-4981   °Substance Abuse Resources °Organization         Address  Phone  Notes  °[facility]  336-882-2125   °[facility]  336-784-9470   °[facility]  336-285-9073   °[facility]  336-845-3988   °Residential & Outpatient Substance Abuse Program  1-800-659-3381   °Psychological Services °Organization         Address  Phone  Notes  °[facility]  336- 832-9600   °[facility]  336- 378-7881   °[facility] [address], [place] 1-800-853-5163 or  336-641-4981   ° °Mobile Crisis Teams °Organization         Address  Phone  Notes  °[facility], Mobile Crisis Care Unit  1-877-626-1772   °Assertive °[facility] ° [address] [place], [place] 336-834-9664   °Sharon DeEsch [address] °[place] 336-554-5454   ° °Self-Help/Support Groups °Organization         Address  Phone             Notes  °[facility] - variety of support groups  336- 373-1402 Call for more information  °Narcotics Anonymous (NA), [facility] [address], °[place]  2 meetings at this location  ° °Residential Treatment Programs °Organization         Address  Phone  Notes  °[facility] [address],    °[place]  1-866-801-8205   °[facility] ° [address], [place], [place] 704-293-8524   °[facility] [address], [place] 336-845-3988 Admissions: 8am-3pm M-F  °[facility] [address]    °[place], [place] 336-841-1104   °[facility] [address], [place], [place] 336-379-7146   °[facility] [address]  °[place], [place] 336-285-9073   °[facility] - Intensive Outpatient [address], [place], [place] 336-852-3033   °[facility] [address]  °[place], [place] 1-877-615-2722 or 336-784-9470   °[facility] [address] [place], [place] 336-227-7417 Accepts Medicaid  °[facility] [address]  ° [place] 1-800-659-3381 Substance Abuse/Addiction Treatment  ° °[place] Behavioral Health Resources °Organization         Address  Phone  Notes  °[facility]  (888) 581-9988   °Julie Brannon, PhD [address] [place], [place]   (336) 349-5553 or (336) 951-0000   °[facility]   [address] °[place],  (336) 349-4454   °  [facility] [address],  [place], [place] (336) 342-8316 Insurance/Medicaid/sponsorship through [facility]  °[facility] [address]                                    [place], [place] (336) 342-8316 Therapy/tele-psych/case  °[facility] [address]  ° [place], [place] (336) 349-2233    °Dr. Arfeen  (336) 349-4544   °[facility] [place]  [employer] [place] [facility] 1) [address], [place] °2) [address], [place] °3)  [address], [place] (336) 349-3220 °(336) 342-7768 ° °(336) 342-8140   °[place] [facility] (336) 342-1394 or (336) 342-3537 (After Hours)    ° ° ° °

## 2017-05-20 ENCOUNTER — Emergency Department (HOSPITAL_COMMUNITY)
Admission: EM | Admit: 2017-05-20 | Discharge: 2017-05-20 | Disposition: A | Discharge status: Home Health | Payer: Medicare Other | Attending: Emergency Medicine | Admitting: Emergency Medicine

## 2017-05-20 ENCOUNTER — Encounter (HOSPITAL_COMMUNITY): Payer: Self-pay | Admitting: Emergency Medicine

## 2017-05-20 DIAGNOSIS — F1010 Alcohol abuse, uncomplicated: Secondary | ICD-10-CM | POA: Insufficient documentation

## 2017-05-20 DIAGNOSIS — Z008 Encounter for other general examination: Secondary | ICD-10-CM | POA: Diagnosis not present

## 2017-05-20 DIAGNOSIS — F17210 Nicotine dependence, cigarettes, uncomplicated: Secondary | ICD-10-CM | POA: Diagnosis not present

## 2017-05-20 DIAGNOSIS — Z79899 Other long term (current) drug therapy: Secondary | ICD-10-CM | POA: Insufficient documentation

## 2017-05-20 DIAGNOSIS — Z8659 Personal history of other mental and behavioral disorders: Secondary | ICD-10-CM | POA: Insufficient documentation

## 2017-05-20 MED ORDER — CHLORDIAZEPOXIDE HCL 25 MG PO CAPS: ORAL_CAPSULE | ORAL | 0 refills | Status: DC

## 2017-05-20 NOTE — ED Provider Notes (Signed)
[facility]-EMERGENCY DEPT Provider Note   CSN: [id number] Arrival date & time: 05/20/17  1238     History   Chief Complaint Chief Complaint  Patient presents with  . Medical Clearance    HPI Autumn [name] is a 61 y.o. female.  HPI Patient has a history of alcohol abuse reports over the past 3 weeks she is back to binge drinking again.  She's requesting assistance detox.  No homicidal or suicidal thoughts.  No hallucinations.  Her last drink was earlier today.  She came to the ER requesting detox.  Denies chest pain.  No shortness of breath.  Denies abdominal pain.  Symptoms are mild in severity.  She is tearful.  Past Medical History:  Diagnosis Date  . Arthritis   . Attention deficit disorder (ADD)   . Drug abuse [facility])     Patient Active Problem List   Diagnosis Date Noted  . Attention deficit disorder (ADD)   . Drug abuse (HCC)   . SINUSITIS, ACUTE 10/11/2007  . ONYCHOMYCOSIS, TOENAILS 06/28/2007  . HEPATITIS C 05/26/2007  . SUBSTANCE ABUSE, MULTIPLE 05/26/2007  . CIRRHOSIS 05/26/2007  . LOW BACK PAIN SYNDROME 05/26/2007  . TROCHANTERIC BURSITIS, LEFT 05/26/2007  . INSOMNIA 05/13/2007  . HOARSENESS, CHRONIC 05/13/2007  . CARPAL TUNNEL SYNDROME, BILATERAL 12/02/2006  . DIVERTICULOSIS, COLON 06/01/2006  . RECTAL BLEEDING, HX OF 04/10/2006    Past Surgical History:  Procedure Laterality Date  . ANKLE SURGERY    . FACIAL COSMETIC SURGERY      OB History    No data available       Home Medications    Prior to Admission medications   Medication Sig Start Date End Date Taking? Authorizing Provider  alum & mag hydroxide-simeth (MAALOX REGULAR STRENGTH) 200-200-20 MG/5ML suspension Take 15 mLs by mouth every 6 (six) hours as needed for indigestion or heartburn. 09/29/15   Dan Humphreys, MD  amphetamine-dextroamphetamine (ADDERALL XR) 30 MG 24 hr capsule Take 30 mg by mouth every morning.    [provider]  amphetamine-dextroamphetamine (ADDERALL) 10 MG  tablet Take 10 mg by mouth every evening.    [provider]  buprenorphine-naloxone (SUBOXONE) 8-2 MG SUBL Place 1 tablet under the tongue daily. May take additional doses if needed    [provider]  chlordiazePOXIDE (LIBRIUM) 25 MG capsule  PO TID x 1D, then 25-50mg  PO BID X 1D, then 25-50mg  PO QD X 1D 05/20/17   Azalia Bilis, MD  esomeprazole (NEXIUM) 40 MG capsule Take 1 capsule (40 mg total) by mouth daily. 12/06/12   Rolan Bucco, MD  glucosamine-chondroitin 500-400 MG tablet Take 1 tablet by mouth every morning.    [provider]  magnesium gluconate (MAGONATE) 500 MG tablet Take 500 mg by mouth every morning.    [provider]  Multiple Vitamin (MULTIVITAMIN WITH MINERALS) TABS Take 1 tablet by mouth every morning.     [provider]  Omega-3 Fatty Acids (FISH OIL PO) Take 1 capsule by mouth every morning.     [provider]  pantoprazole (PROTONIX) 20 MG tablet Take 1 tablet (20 mg total) by mouth daily. 09/29/15   Dan Humphreys, MD  Probiotic Product (PROBIOTIC DAILY PO) Take 1 tablet by mouth every morning.     [provider]    Family History No family history on file.  Social History Social History  Substance Use Topics  . Smoking status: Current Some Day Smoker    Packs/day: 0.50  Types: Cigarettes  . Smokeless tobacco: Never Used  . Alcohol use Yes     Comment: A fifth of vodka a day     Allergies   Patient has no known allergies.   Review of Systems Review of Systems  All other systems reviewed and are negative.    Physical Exam Updated Vital Signs BP (!) 143/99   Pulse 88   Temp 98.6 F (37 C)   Resp 18   Ht  (1.651 m)   Wt 99.8 kg (220 lb)   SpO2 93%   BMI 36.61 kg/m   Physical Exam  Constitutional: She is oriented to person, place, and time. She appears well-developed and well-nourished.  HENT:  Head: Normocephalic.  Eyes: EOM are normal.  Neck: Normal range of  motion.  Cardiovascular: Normal rate and regular rhythm.   Pulmonary/Chest: Effort normal and breath sounds normal.  Abdominal: Soft. She exhibits no distension.  Musculoskeletal: Normal range of motion.  Neurological: She is alert and oriented to person, place, and time.  Psychiatric: She has a normal mood and affect.  Nursing note and vitals reviewed.    ED Treatments / Results  Labs (all labs ordered are listed, but only abnormal results are displayed) Labs Reviewed - No data to display  EKG  EKG Interpretation None       Radiology No results found.  Procedures Procedures (including critical care time)  Medications Ordered in ED Medications - No data to display   Initial Impression / Assessment and Plan / ED Course  I have reviewed the triage vital signs and the nursing notes.  Pertinent labs & imaging results that were available during my care of the patient were reviewed by me and considered in my medical decision making (see chart for details).       Screening examinations complete.  Vital signs are stable.  No life-threatening illness.  Patient is not an active withdrawals.  Discharge home with Librium protocol and outpatient resources for alcohol abuse.  No indication for IVC.  No indication for acute hospitalization.  Final Clinical Impressions(s) / ED Diagnoses   Final diagnoses:  Alcohol abuse    New Prescriptions New Prescriptions   CHLORDIAZEPOXIDE (LIBRIUM) 25 MG CAPSULE     PO TID x 1D, then 25-50mg  PO BID X 1D, then 25-50mg  PO QD X 1D     Azalia Bilis, MD 05/20/17 1305

## 2017-05-20 NOTE — ED Triage Notes (Signed)
Pt brought to ED by EMS for medical clearance and help seeking detox for ETOH abuse. Pt reports drinking a 5th of vodka a day x 3 weeks. Pt denies SI/HI

## 2017-06-26 ENCOUNTER — Other Ambulatory Visit: Payer: Self-pay

## 2017-06-26 ENCOUNTER — Observation Stay (HOSPITAL_COMMUNITY)
Admission: EM | Admit: 2017-06-26 | Discharge: 2017-06-29 | Disposition: A | Discharge status: Home / Self Care | Payer: Medicare Other | Attending: Internal Medicine | Admitting: Internal Medicine

## 2017-06-26 ENCOUNTER — Encounter (HOSPITAL_COMMUNITY): Payer: Self-pay

## 2017-06-26 ENCOUNTER — Emergency Department (HOSPITAL_COMMUNITY): Payer: Medicare Other

## 2017-06-26 DIAGNOSIS — R079 Chest pain, unspecified: Secondary | ICD-10-CM | POA: Diagnosis not present

## 2017-06-26 DIAGNOSIS — F1410 Cocaine abuse, uncomplicated: Secondary | ICD-10-CM | POA: Diagnosis not present

## 2017-06-26 DIAGNOSIS — F17210 Nicotine dependence, cigarettes, uncomplicated: Secondary | ICD-10-CM | POA: Insufficient documentation

## 2017-06-26 DIAGNOSIS — B1710 Acute hepatitis C without hepatic coma: Secondary | ICD-10-CM | POA: Diagnosis present

## 2017-06-26 DIAGNOSIS — Z87898 Personal history of other specified conditions: Secondary | ICD-10-CM | POA: Diagnosis present

## 2017-06-26 DIAGNOSIS — K219 Gastro-esophageal reflux disease without esophagitis: Secondary | ICD-10-CM | POA: Diagnosis present

## 2017-06-26 DIAGNOSIS — F329 Major depressive disorder, single episode, unspecified: Secondary | ICD-10-CM | POA: Insufficient documentation

## 2017-06-26 DIAGNOSIS — J449 Chronic obstructive pulmonary disease, unspecified: Secondary | ICD-10-CM | POA: Diagnosis not present

## 2017-06-26 DIAGNOSIS — E785 Hyperlipidemia, unspecified: Secondary | ICD-10-CM | POA: Insufficient documentation

## 2017-06-26 DIAGNOSIS — F10230 Alcohol dependence with withdrawal, uncomplicated: Secondary | ICD-10-CM | POA: Diagnosis not present

## 2017-06-26 DIAGNOSIS — F1910 Other psychoactive substance abuse, uncomplicated: Secondary | ICD-10-CM | POA: Diagnosis present

## 2017-06-26 DIAGNOSIS — F32A Depression, unspecified: Secondary | ICD-10-CM | POA: Diagnosis present

## 2017-06-26 DIAGNOSIS — Z23 Encounter for immunization: Secondary | ICD-10-CM | POA: Diagnosis not present

## 2017-06-26 DIAGNOSIS — F988 Other specified behavioral and emotional disorders with onset usually occurring in childhood and adolescence: Secondary | ICD-10-CM

## 2017-06-26 DIAGNOSIS — Z79899 Other long term (current) drug therapy: Secondary | ICD-10-CM | POA: Diagnosis not present

## 2017-06-26 DIAGNOSIS — F909 Attention-deficit hyperactivity disorder, unspecified type: Secondary | ICD-10-CM | POA: Diagnosis not present

## 2017-06-26 DIAGNOSIS — F1010 Alcohol abuse, uncomplicated: Secondary | ICD-10-CM | POA: Diagnosis present

## 2017-06-26 DIAGNOSIS — R0789 Other chest pain: Secondary | ICD-10-CM | POA: Diagnosis not present

## 2017-06-26 DIAGNOSIS — I10 Essential (primary) hypertension: Secondary | ICD-10-CM | POA: Diagnosis not present

## 2017-06-26 HISTORY — DX: Major depressive disorder, single episode, unspecified: F32.9

## 2017-06-26 HISTORY — DX: Depression, unspecified: F32.A

## 2017-06-26 HISTORY — DX: Hyperlipidemia, unspecified: E78.5

## 2017-06-26 HISTORY — DX: Anxiety disorder, unspecified: F41.9

## 2017-06-26 HISTORY — DX: Fibromyalgia: M79.7

## 2017-06-26 HISTORY — DX: Chronic obstructive pulmonary disease, unspecified: J44.9

## 2017-06-26 LAB — COMPREHENSIVE METABOLIC PANEL
ALT: 30 U/L (ref 14–54)
ANION GAP: 14 (ref 5–15)
AST: 34 U/L (ref 15–41)
Albumin: 3.6 g/dL (ref 3.5–5.0)
Alkaline Phosphatase: 92 U/L (ref 38–126)
BUN: 12 mg/dL (ref 6–20)
CHLORIDE: 104 mmol/L (ref 101–111)
CO2: 22 mmol/L (ref 22–32)
Calcium: 8.9 mg/dL (ref 8.9–10.3)
Creatinine, Ser: 0.68 mg/dL (ref 0.44–1.00)
GFR calc non Af Amer: >60 mL/min (ref >60)
Glucose, Bld: 118 mg/dL — ABNORMAL HIGH (ref 65–99)
POTASSIUM: 3.5 mmol/L (ref 3.5–5.1)
SODIUM: 140 mmol/L (ref 135–145)
Total Bilirubin: 0.7 mg/dL (ref 0.3–1.2)
Total Protein: 7.5 g/dL (ref 6.5–8.1)

## 2017-06-26 LAB — CBC WITH DIFFERENTIAL/PLATELET
Basophils Absolute: 0 10*3/uL (ref 0.0–0.1)
Basophils Relative: 0 %
EOS ABS: 0 10*3/uL (ref 0.0–0.7)
EOS PCT: 0 %
HCT: 43.1 % (ref 36.0–46.0)
Hemoglobin: 14.6 g/dL (ref 12.0–15.0)
Lymphocytes Relative: 21 %
Lymphs Abs: 1.5 10*3/uL (ref 0.7–4.0)
MCH: 29.4 pg (ref 26.0–34.0)
MCHC: 33.9 g/dL (ref 30.0–36.0)
MCV: 86.7 fL (ref 78.0–100.0)
MONO ABS: 0.3 10*3/uL (ref 0.1–1.0)
MONOS PCT: 5 %
Neutro Abs: 5.3 10*3/uL (ref 1.7–7.7)
Neutrophils Relative %: 74 %
PLATELETS: 272 10*3/uL (ref 150–400)
RBC: 4.97 MIL/uL (ref 3.87–5.11)
RDW: 14.5 % (ref 11.5–15.5)
WBC: 7.2 10*3/uL (ref 4.0–10.5)

## 2017-06-26 LAB — RAPID URINE DRUG SCREEN, HOSP PERFORMED
AMPHETAMINES: POSITIVE — AB
Barbiturates: NOT DETECTED
Benzodiazepines: POSITIVE — AB
COCAINE: POSITIVE — AB
OPIATES: NOT DETECTED
TETRAHYDROCANNABINOL: POSITIVE — AB

## 2017-06-26 LAB — ETHANOL: Alcohol, Ethyl (B): 39 mg/dL — ABNORMAL HIGH (ref <10)

## 2017-06-26 LAB — TROPONIN I

## 2017-06-26 MED ORDER — MORPHINE SULFATE (PF) 4 MG/ML IV SOLN
6.0000 mg | Freq: Once | INTRAVENOUS | Status: AC
  Administered 2017-06-26: 6 mg via INTRAVENOUS
  Filled 2017-06-26: qty 2

## 2017-06-26 MED ORDER — FOLIC ACID 1 MG PO TABS
1.0000 mg | ORAL_TABLET | Freq: Every day | ORAL | Status: DC
  Administered 2017-06-27 – 2017-06-29 (×3): 1 mg via ORAL
  Filled 2017-06-26 (×3): qty 1

## 2017-06-26 MED ORDER — MORPHINE SULFATE (PF) 2 MG/ML IV SOLN
2.0000 mg | INTRAVENOUS | Status: DC | PRN
  Administered 2017-06-27: 2 mg via INTRAVENOUS
  Filled 2017-06-26: qty 1

## 2017-06-26 MED ORDER — PANTOPRAZOLE SODIUM 40 MG PO TBEC
40.0000 mg | DELAYED_RELEASE_TABLET | Freq: Every day | ORAL | Status: DC
  Administered 2017-06-27 – 2017-06-29 (×2): 40 mg via ORAL
  Filled 2017-06-26 (×3): qty 1

## 2017-06-26 MED ORDER — THIAMINE HCL 100 MG/ML IJ SOLN: 100.0000 mg | Freq: Every day | INTRAMUSCULAR | Status: DC

## 2017-06-26 MED ORDER — ONDANSETRON HCL 4 MG/2ML IJ SOLN: 4.0000 mg | Freq: Four times a day (QID) | INTRAMUSCULAR | Status: DC | PRN

## 2017-06-26 MED ORDER — ENOXAPARIN SODIUM 40 MG/0.4ML ~~LOC~~ SOLN
40.0000 mg | SUBCUTANEOUS | Status: DC
  Administered 2017-06-27 – 2017-06-28 (×2): 40 mg via SUBCUTANEOUS
  Filled 2017-06-26 (×2): qty 0.4

## 2017-06-26 MED ORDER — ONDANSETRON HCL 4 MG/2ML IJ SOLN
4.0000 mg | Freq: Once | INTRAMUSCULAR | Status: AC
  Administered 2017-06-26: 4 mg via INTRAVENOUS
  Filled 2017-06-26: qty 2

## 2017-06-26 MED ORDER — BUPRENORPHINE HCL-NALOXONE HCL 8-2 MG SL SUBL
1.0000 | SUBLINGUAL_TABLET | Freq: Every day | SUBLINGUAL | Status: DC
  Administered 2017-06-27 – 2017-06-29 (×3): 1 via SUBLINGUAL
  Filled 2017-06-26 (×3): qty 1

## 2017-06-26 MED ORDER — MAGNESIUM GLUCONATE 500 MG PO TABS: 500.0000 mg | ORAL_TABLET | Freq: Every morning | ORAL | Status: DC

## 2017-06-26 MED ORDER — LORAZEPAM 2 MG/ML IJ SOLN
1.0000 mg | Freq: Four times a day (QID) | INTRAMUSCULAR | Status: DC | PRN
  Administered 2017-06-27 (×3): 1 mg via INTRAVENOUS
  Filled 2017-06-26 (×3): qty 1

## 2017-06-26 MED ORDER — LORAZEPAM 1 MG PO TABS: 1.0000 mg | ORAL_TABLET | Freq: Four times a day (QID) | ORAL | Status: DC | PRN

## 2017-06-26 MED ORDER — HYDRALAZINE HCL 25 MG PO TABS: 25.0000 mg | ORAL_TABLET | Freq: Four times a day (QID) | ORAL | Status: DC | PRN

## 2017-06-26 MED ORDER — NITROGLYCERIN 2 % TD OINT
0.5000 [in_us] | TOPICAL_OINTMENT | Freq: Four times a day (QID) | TRANSDERMAL | Status: DC
  Administered 2017-06-27 – 2017-06-29 (×8): 0.5 [in_us] via TOPICAL
  Filled 2017-06-26 (×9): qty 1

## 2017-06-26 MED ORDER — ACETAMINOPHEN 325 MG PO TABS
650.0000 mg | ORAL_TABLET | ORAL | Status: DC | PRN
  Administered 2017-06-29: 650 mg via ORAL
  Filled 2017-06-26: qty 2

## 2017-06-26 MED ORDER — NITROGLYCERIN 2 % TD OINT
1.0000 [in_us] | TOPICAL_OINTMENT | Freq: Once | TRANSDERMAL | Status: AC
  Administered 2017-06-26: 1 [in_us] via TOPICAL
  Filled 2017-06-26: qty 1

## 2017-06-26 MED ORDER — LABETALOL HCL 5 MG/ML IV SOLN
20.0000 mg | Freq: Once | INTRAVENOUS | Status: AC
Start: 2017-06-26 — End: 2017-06-26
  Administered 2017-06-26: 20 mg via INTRAVENOUS
  Filled 2017-06-26: qty 4

## 2017-06-26 MED ORDER — VITAMIN B-1 100 MG PO TABS
100.0000 mg | ORAL_TABLET | Freq: Every day | ORAL | Status: DC
  Administered 2017-06-27 – 2017-06-29 (×3): 100 mg via ORAL
  Filled 2017-06-26 (×3): qty 1

## 2017-06-26 MED ORDER — AMPHETAMINE-DEXTROAMPHET ER 30 MG PO CP24
30.0000 mg | ORAL_CAPSULE | Freq: Every day | ORAL | Status: DC
  Administered 2017-06-27 – 2017-06-29 (×3): 30 mg via ORAL
  Filled 2017-06-26 (×3): qty 1

## 2017-06-26 MED ORDER — ADULT MULTIVITAMIN W/MINERALS CH
1.0000 | ORAL_TABLET | Freq: Every day | ORAL | Status: DC
  Administered 2017-06-27 – 2017-06-29 (×3): 1 via ORAL
  Filled 2017-06-26 (×3): qty 1

## 2017-06-26 MED ORDER — SODIUM CHLORIDE 0.9 % IV BOLUS (SEPSIS)
500.0000 mL | Freq: Once | INTRAVENOUS | Status: AC
  Administered 2017-06-26: 500 mL via INTRAVENOUS

## 2017-06-26 MED ORDER — PNEUMOCOCCAL VAC POLYVALENT 25 MCG/0.5ML IJ INJ
0.5000 mL | INJECTION | INTRAMUSCULAR | Status: AC
  Administered 2017-06-27: 0.5 mL via INTRAMUSCULAR
  Filled 2017-06-26: qty 0.5

## 2017-06-26 NOTE — ED Provider Notes (Signed)
[facility] Provider Note   CSN: [id number] Arrival date & time: 06/26/17  1810     History   Chief Complaint Chief Complaint  Patient presents with  . Chest Pain    HPI Autumn [name] is a 61 y.o. female.  Patient complains of chest discomfort today.  Patient states she is been drinking a lot of alcohol last few days.  Also she states she has been using some cocaine and methamphetamines last couple days.  Patient has a history of cardiac MI   The history is provided by the patient.  Chest Pain   This is a new problem. The current episode started 6 to 12 hours ago. The problem occurs daily. The problem has not changed since onset.The pain is associated with exertion. The pain is present in the substernal region. The pain is at a severity of 5/10. The pain is moderate. The quality of the pain is described as heavy. Pertinent negatives include no abdominal pain, no back pain, no cough and no headaches.  Pertinent negatives for past medical history include no seizures.    Past Medical History:  Diagnosis Date  . Anxiety   . Arthritis   . Attention deficit disorder (ADD)   . COPD (chronic obstructive pulmonary disease) (HCC)   . Depression   . Drug abuse (HCC)   . Fibromyalgia   . Hyperlipidemia     Patient Active Problem List   Diagnosis Date Noted  . Attention deficit disorder (ADD)   . Drug abuse (HCC)   . SINUSITIS, ACUTE 10/11/2007  . ONYCHOMYCOSIS, TOENAILS 06/28/2007  . HEPATITIS C 05/26/2007  . SUBSTANCE ABUSE, MULTIPLE 05/26/2007  . CIRRHOSIS 05/26/2007  . LOW BACK PAIN SYNDROME 05/26/2007  . TROCHANTERIC BURSITIS, LEFT 05/26/2007  . INSOMNIA 05/13/2007  . HOARSENESS, CHRONIC 05/13/2007  . CARPAL TUNNEL SYNDROME, BILATERAL 12/02/2006  . DIVERTICULOSIS, COLON 06/01/2006  . RECTAL BLEEDING, HX OF 04/10/2006    Past Surgical History:  Procedure Laterality Date  . ANKLE SURGERY    . FACIAL COSMETIC SURGERY      OB History    No  data available       Home Medications    Prior to Admission medications   Medication Sig Start Date End Date Taking? Authorizing Provider  alum & mag hydroxide-simeth (MAALOX REGULAR STRENGTH) 200-200-20 MG/5ML suspension Take 15 mLs by mouth every 6 (six) hours as needed for indigestion or heartburn. 09/29/15   Dan HumphreysIrick, Michael, MD  amphetamine-dextroamphetamine (ADDERALL XR) 30 MG 24 hr capsule Take 30 mg by mouth every morning.    [provider]  amphetamine-dextroamphetamine (ADDERALL) 10 MG tablet Take 10 mg by mouth every evening.    [provider]  buprenorphine-naloxone (SUBOXONE) 8-2 MG SUBL Place 1 tablet under the tongue daily. May take additional doses if needed    [provider]  chlordiazePOXIDE (LIBRIUM) 25 MG capsule 50mg  PO TID x 1D, then 25-50mg  PO BID X 1D, then 25-50mg  PO QD X 1D 05/20/17   Azalia Bilisampos, Kevin, MD  esomeprazole (NEXIUM) 40 MG capsule Take 1 capsule (40 mg total) by mouth daily. 12/06/12   Rolan BuccoBelfi, Melanie, MD  glucosamine-chondroitin 500-400 MG tablet Take 1 tablet by mouth every morning.    [provider]  magnesium gluconate (MAGONATE) 500 MG tablet Take 500 mg by mouth every morning.    [provider]  Multiple Vitamin (MULTIVITAMIN WITH MINERALS) TABS Take 1 tablet by mouth every morning.     [provider]  Omega-3 Fatty Acids (FISH OIL PO) Take 1 capsule by mouth every morning.     [provider]  pantoprazole (PROTONIX) 20 MG tablet Take 1 tablet (20 mg total) by mouth daily. 09/29/15   Dan HumphreysIrick, Michael, MD  Probiotic Product (PROBIOTIC DAILY PO) Take 1 tablet by mouth every morning.     [provider]    Family History No family history on file.  Social History Social History   Tobacco Use  . Smoking status: Current Some Day Smoker    Packs/day: 0.50    Types: Cigarettes  . Smokeless tobacco: Never Used  Substance Use Topics  . Alcohol use: Yes    Comment: A fifth of vodka  a day  . Drug use: Yes    Types: Cocaine, Marijuana    Comment: used cocaine and marijuana yesterday     Allergies   Patient has no known allergies.   Review of Systems Review of Systems  Constitutional: Negative for appetite change and fatigue.  HENT: Negative for congestion, ear discharge and sinus pressure.   Eyes: Negative for discharge.  Respiratory: Negative for cough.   Cardiovascular: Positive for chest pain.  Gastrointestinal: Negative for abdominal pain and diarrhea.  Genitourinary: Negative for frequency and hematuria.  Musculoskeletal: Negative for back pain.  Skin: Negative for rash.  Neurological: Negative for seizures and headaches.  Psychiatric/Behavioral: Negative for hallucinations.     Physical Exam Updated Vital Signs BP (!) 175/112 (BP Location: Left Arm)   Pulse (!) 119   Temp 98.5 F (36.9 C) (Oral)   Resp 17   Ht 5\' 5"  (1.651 m)   Wt 102.1 kg (225 lb)   SpO2 94%   BMI 37.44 kg/m   Physical Exam  Constitutional: She is oriented to person, place, and time. She appears well-developed.  HENT:  Head: Normocephalic.  Eyes: Conjunctivae and EOM are normal. No scleral icterus.  Neck: Neck supple. No thyromegaly present.  Cardiovascular: Normal rate and regular rhythm. Exam reveals no gallop and no friction rub.  No murmur heard. Pulmonary/Chest: No stridor. She has no wheezes. She has no rales. She exhibits no tenderness.  Abdominal: She exhibits no distension. There is no tenderness. There is no rebound.  Musculoskeletal: Normal range of motion. She exhibits no edema.  Lymphadenopathy:    She has no cervical adenopathy.  Neurological: She is oriented to person, place, and time. She exhibits normal muscle tone. Coordination normal.  Skin: No rash noted. No erythema.  Psychiatric: She has a normal mood and affect. Her behavior is normal.  Nursing note and vitals reviewed.    ED Treatments / Results  Labs (all labs ordered are listed, but  only abnormal results are displayed) Labs Reviewed  COMPREHENSIVE METABOLIC PANEL - Abnormal; Notable for the following components:      Result Value   Glucose, Bld 118 (*)    All other components within normal limits  RAPID URINE DRUG SCREEN, HOSP PERFORMED - Abnormal; Notable for the following components:   Cocaine POSITIVE (*)    Benzodiazepines POSITIVE (*)    Amphetamines POSITIVE (*)    Tetrahydrocannabinol POSITIVE (*)    All other components within normal limits  ETHANOL - Abnormal; Notable for the following components:   Alcohol, Ethyl (B) 39 (*)    All other components within normal limits  CBC WITH DIFFERENTIAL/PLATELET  TROPONIN I    EKG  EKG Interpretation  Date/Time:  Saturday June 26 2017 18:12:57 EST Ventricular Rate:  118 PR Interval:  QRS Duration: 85 QT Interval:  292 QTC Calculation: 409 R Axis:   58 Text Interpretation:  Sinus tachycardia Multiple ventricular premature complexes Nonspecific repol abnormality, diffuse leads Confirmed by Bethann Berkshire [phone]) on 06/26/2017 8:35:07 PM       Radiology Dg Chest 2 View  Result Date: 06/26/2017 CLINICAL DATA:  61 year old female with chest pain. EXAM: CHEST  2 VIEW COMPARISON:  Chest radiograph dated 09/29/2015 FINDINGS: There is mild emphysematous changes of the lungs with hyperinflation. There is no focal consolidation, pleural effusion, or pneumothorax. The cardiac silhouette is within normal limits. No acute osseous pathology. IMPRESSION: No active cardiopulmonary disease. COPD. Electronically Signed   By: Elgie Collard M.D.   On: 06/26/2017 19:11    Procedures Procedures (including critical care time)  Medications Ordered in ED Medications  ondansetron (ZOFRAN) injection 4 mg (4 mg Intravenous Given 06/26/17 1838)  sodium chloride 0.9 % bolus 500 mL (500 mLs Intravenous New Bag/Given 06/26/17 2041)  morphine 4 MG/ML injection 6 mg (6 mg Intravenous Given 06/26/17 2045)  nitroGLYCERIN  (NITROGLYN) 2 % ointment 1 inch (1 inch Topical Given 06/26/17 2045)  labetalol (NORMODYNE,TRANDATE) injection 20 mg (20 mg Intravenous Given 06/26/17 2120)     Initial Impression / Assessment and Plan / ED Course  I have reviewed the triage vital signs and the nursing notes.  Pertinent labs & imaging results that were available during my care of the patient were reviewed by me and considered in my medical decision making (see chart for details).     Patient with chest pain that resolved with nitro and morphine.  Labs are unremarkable.  EKG shows some ST depression that looks new from a year ago.  She will be admitted to medicine for rule out and further workup for chest pain  Final Clinical Impressions(s) / ED Diagnoses   Final diagnoses:  Nonspecific chest pain    ED Discharge Orders    None       Bethann Berkshire, MD 06/26/17 2128

## 2017-06-26 NOTE — ED Triage Notes (Addendum)
Pt brought in by EMs due to Chest pain that began this morning. Pt left breast area. No radiation.baby asa x 4 and nitro x1 adm by EMS. Pt reports she ran out of her vodka approx 12 pm. Pt reports that she used cocaine and marijuana yesterday. States she is depressed, but denies SI

## 2017-06-26 NOTE — ED Notes (Signed)
Pt given gingerale for PO challenge per Dr Estell HarpinZammit ok

## 2017-06-26 NOTE — H&P (Signed)
[facility] H&P    Patient Demographics:    Autumn [name], is a 61 y.o. female  MRN: [id number]  DOB - Apr 08, 1956  Admit Date - 06/26/2017  Referring MD/NP/PA: Dr. Estell Harpin  Outpatient Primary MD for the patient is Julieanne Manson, MD  Patient coming from: Home  Chief Complaint  Patient presents with  . Chest Pain      HPI:    Autumn [name]  is a 61 y.o. female,.. With history of COPD, ADHD, depression, fibromyalgia came to hospital with chest pain.  Patient states that she has been drinking a lot of alcohol over the past few days.  Also did cocaine and methamphetamine in the past few days. She denies shortness of breath.  The pain is intermittent.  Does not radiate. Patient says that she had a cardiac cath in 2017 which did not show significant blockage in the coronary arteries. 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  Denies nausea vomiting or diarrhea. No coughing up any phlegm. No fever or chills. No history of dysuria.    Review of systems:      All other systems reviewed and are negative.   With Past History of the following :    Past Medical History:  Diagnosis Date  . Anxiety   . Arthritis   . Attention deficit disorder (ADD)   . COPD (chronic obstructive pulmonary disease) (HCC)   . Depression   . Drug abuse (HCC)   . Fibromyalgia   . Hyperlipidemia       Past Surgical History:  Procedure Laterality Date  . ANKLE SURGERY    . FACIAL COSMETIC SURGERY        Social History:      Social History   Tobacco Use  . Smoking status: Current Some Day Smoker    Packs/day: 0.50    Types: Cigarettes  . Smokeless tobacco: Never Used  Substance Use Topics  . Alcohol use: Yes    Comment: A fifth of vodka a day       Family History :   Patient's mother has pacemaker in place.  No history of cancer or CAD   Home Medications:   Prior to Admission medications   Medication Sig Start  Date End Date Taking? Authorizing Provider  alum & mag hydroxide-simeth (MAALOX REGULAR STRENGTH) 200-200-20 MG/5ML suspension Take 15 mLs by mouth every 6 (six) hours as needed for indigestion or heartburn. 09/29/15   Dan Humphreys, MD  amphetamine-dextroamphetamine (ADDERALL XR) 30 MG 24 hr capsule Take 30 mg by mouth every morning.    [provider]  amphetamine-dextroamphetamine (ADDERALL) 10 MG tablet Take 10 mg by mouth every evening.    [provider]  buprenorphine-naloxone (SUBOXONE) 8-2 MG SUBL Place 1 tablet under the tongue daily. May take additional doses if needed    [provider]  chlordiazePOXIDE (LIBRIUM) 25 MG capsule 50mg  PO TID x 1D, then 25-50mg  PO BID X 1D, then 25-50mg  PO QD X 1D 05/20/17   Azalia Bilis, MD  esomeprazole (NEXIUM) 40 MG capsule Take 1  capsule (40 mg total) by mouth daily. 12/06/12   Rolan BuccoBelfi, Melanie, MD  glucosamine-chondroitin 500-400 MG tablet Take 1 tablet by mouth every morning.    [provider]  magnesium gluconate (MAGONATE) 500 MG tablet Take 500 mg by mouth every morning.    [provider]  Multiple Vitamin (MULTIVITAMIN WITH MINERALS) TABS Take 1 tablet by mouth every morning.     [provider]  Omega-3 Fatty Acids (FISH OIL PO) Take 1 capsule by mouth every morning.     [provider]  pantoprazole (PROTONIX) 20 MG tablet Take 1 tablet (20 mg total) by mouth daily. 09/29/15   Dan HumphreysIrick, Michael, MD  Probiotic Product (PROBIOTIC DAILY PO) Take 1 tablet by mouth every morning.     [provider]     Allergies:    No Known Allergies   Physical Exam:   Vitals  Blood pressure (!) 141/98, pulse 97, temperature 98.5 F (36.9 C), temperature source Oral, resp. rate 14, height 5\' 5"  (1.651 m), weight 102.1 kg (225 lb), SpO2 93 %.  1.  General: Appears in no acute distress  2. Psychiatric:  Intact judgement and  insight, awake alert, oriented x 3.  3. Neurologic: No  focal neurological deficits, all cranial nerves intact.Strength 5/5 all 4 extremities, sensation intact all 4 extremities, plantars down going.  4. Eyes :  anicteric sclerae, moist conjunctivae with no lid lag. PERRLA.  5. ENMT:  Oropharynx clear with moist mucous membranes and good dentition  6. Neck:  supple, no cervical lymphadenopathy appriciated, No thyromegaly  7. Respiratory : Normal respiratory effort, good air movement bilaterally,clear to  auscultation bilaterally  8. Cardiovascular : RRR, no gallops, rubs or murmurs, no leg edema  9. Gastrointestinal:  Positive bowel sounds, abdomen soft, non-tender to palpation,no hepatosplenomegaly, no rigidity or guarding       10. Skin:  No cyanosis, normal texture and turgor, no rash, lesions or ulcers  11.Musculoskeletal:  Left mid sternal tenderness to palpation   Data Review:    CBC Recent Labs  Lab 06/26/17 1833  WBC 7.2  HGB 14.6  HCT 43.1  PLT 272  MCV 86.7  MCH 29.4  MCHC 33.9  RDW 14.5  LYMPHSABS 1.5  MONOABS 0.3  EOSABS 0.0  BASOSABS 0.0   ------------------------------------------------------------------------------------------------------------------  Chemistries  Recent Labs  Lab 06/26/17 1833  NA 140  K 3.5  CL 104  CO2 22  GLUCOSE 118*  BUN 12  CREATININE 0.68  CALCIUM 8.9  AST 34  ALT 30  ALKPHOS 92  BILITOT 0.7   ------------------------------------------------------------------------------------------------------------------  ------------------------------------------------------------------------------------------------------------------ GFR: Estimated Creatinine Clearance: 88.5 mL/min (by C-G formula based on SCr of 0.68 mg/dL). Liver Function Tests: Recent Labs  Lab 06/26/17 1833  AST 34  ALT 30  ALKPHOS 92  BILITOT 0.7  PROT 7.5  ALBUMIN 3.6   No results for input(s): LIPASE, AMYLASE in the last 168 hours. No results for input(s): AMMONIA in the last 168  hours. Coagulation Profile: No results for input(s): INR, PROTIME in the last 168 hours. Cardiac Enzymes: Recent Labs  Lab 06/26/17 1833  TROPONINI <0.03     --------------------------------------------------------------------------------------------------------------- Urine analysis:    Component Value Date/Time   COLORURINE YELLOW 12/06/2012 1020   APPEARANCEUR CLOUDY (A) 12/06/2012 1020   LABSPEC 1.021 12/06/2012 1020   PHURINE 5.5 12/06/2012 1020   GLUCOSEU NEGATIVE 12/06/2012 1020   HGBUR NEGATIVE 12/06/2012 1020   BILIRUBINUR NEGATIVE 12/06/2012 1020   KETONESUR NEGATIVE 12/06/2012 1020   PROTEINUR  NEGATIVE 12/06/2012 1020   UROBILINOGEN 0.2 12/06/2012 1020   NITRITE NEGATIVE 12/06/2012 1020   LEUKOCYTESUR MODERATE (A) 12/06/2012 1020      Imaging Results:    Dg Chest 2 View  Result Date: 06/26/2017 CLINICAL DATA:  61 year old female with chest pain. EXAM: CHEST  2 VIEW COMPARISON:  Chest radiograph dated 09/29/2015 FINDINGS: There is mild emphysematous changes of the lungs with hyperinflation. There is no focal consolidation, pleural effusion, or pneumothorax. The cardiac silhouette is within normal limits. No acute osseous pathology. IMPRESSION: No active cardiopulmonary disease. COPD. Electronically Signed   By: Elgie CollardArash  Radparvar M.D.   On: 06/26/2017 19:11    My personal review of EKG: Rhythm NSR, nonspecific ST changes   Assessment & Plan:    Active Problems:   HEPATITIS C   Attention deficit disorder (ADD)   Chest pain   1. Chest pain-patient does have atypical component, tenderness to palpation, will place under observation, obtain serial cardiac enzymes.  Patient has used cocaine in the past.  Will give morphine as needed for pain. 2. Hypertension-blood pressure is elevated, start hydralazine 25 mg p.o. every 6 hours as needed for BP more than 160/100. 3. History of opioid addiction-continue Suboxone    DVT Prophylaxis-   Lovenox   AM Labs Ordered,  also please review Full Orders  Family Communication: Admission, patients condition and plan of care including tests being ordered have been discussed with the patient  who indicate understanding and agree with the plan and Code Status.  Code Status: Full   Admission status: Observation  Time spent in minutes : 60 minutes   Meredeth IdeGagan S Kasarah Sitts M.D on 06/26/2017 at 9:37 PM  Between 7am to 7pm - Pager - [phone]. After 7pm go to [email or web address] - password [email or web address]  [facility] - Office  [phone]

## 2017-06-26 NOTE — ED Notes (Signed)
Pt given another gingerale, tolerated the first well.

## 2017-06-27 ENCOUNTER — Encounter (HOSPITAL_COMMUNITY): Payer: Self-pay | Admitting: Behavioral Health

## 2017-06-27 DIAGNOSIS — F32A Depression, unspecified: Secondary | ICD-10-CM | POA: Diagnosis present

## 2017-06-27 DIAGNOSIS — F1410 Cocaine abuse, uncomplicated: Secondary | ICD-10-CM | POA: Diagnosis not present

## 2017-06-27 DIAGNOSIS — F329 Major depressive disorder, single episode, unspecified: Secondary | ICD-10-CM | POA: Diagnosis present

## 2017-06-27 DIAGNOSIS — K219 Gastro-esophageal reflux disease without esophagitis: Secondary | ICD-10-CM | POA: Diagnosis not present

## 2017-06-27 DIAGNOSIS — R079 Chest pain, unspecified: Secondary | ICD-10-CM | POA: Diagnosis not present

## 2017-06-27 DIAGNOSIS — I10 Essential (primary) hypertension: Secondary | ICD-10-CM | POA: Diagnosis not present

## 2017-06-27 DIAGNOSIS — F10230 Alcohol dependence with withdrawal, uncomplicated: Secondary | ICD-10-CM | POA: Diagnosis not present

## 2017-06-27 DIAGNOSIS — F1010 Alcohol abuse, uncomplicated: Secondary | ICD-10-CM | POA: Diagnosis present

## 2017-06-27 DIAGNOSIS — Z87898 Personal history of other specified conditions: Secondary | ICD-10-CM | POA: Diagnosis present

## 2017-06-27 DIAGNOSIS — R0789 Other chest pain: Secondary | ICD-10-CM | POA: Diagnosis not present

## 2017-06-27 DIAGNOSIS — F1910 Other psychoactive substance abuse, uncomplicated: Secondary | ICD-10-CM | POA: Diagnosis present

## 2017-06-27 HISTORY — DX: Personal history of other specified conditions: Z87.898

## 2017-06-27 LAB — TROPONIN I

## 2017-06-27 MED ORDER — DICLOFENAC SODIUM 75 MG PO TBEC
75.0000 mg | DELAYED_RELEASE_TABLET | Freq: Two times a day (BID) | ORAL | Status: DC
  Administered 2017-06-28 – 2017-06-29 (×3): 75 mg via ORAL
  Filled 2017-06-27 (×6): qty 1

## 2017-06-27 MED ORDER — TRAZODONE HCL 50 MG PO TABS
100.0000 mg | ORAL_TABLET | Freq: Every day | ORAL | Status: DC
  Administered 2017-06-27 – 2017-06-28 (×2): 100 mg via ORAL
  Filled 2017-06-27 (×2): qty 2

## 2017-06-27 MED ORDER — HYDROXYZINE PAMOATE 25 MG PO CAPS
25.0000 mg | ORAL_CAPSULE | Freq: Four times a day (QID) | ORAL | Status: DC | PRN
  Filled 2017-06-27: qty 1

## 2017-06-27 MED ORDER — DULOXETINE HCL 60 MG PO CPEP
60.0000 mg | ORAL_CAPSULE | Freq: Every day | ORAL | Status: DC
  Administered 2017-06-27 – 2017-06-29 (×3): 60 mg via ORAL
  Filled 2017-06-27 (×3): qty 1

## 2017-06-27 MED ORDER — MOMETASONE FURO-FORMOTEROL FUM 200-5 MCG/ACT IN AERO
2.0000 | INHALATION_SPRAY | Freq: Two times a day (BID) | RESPIRATORY_TRACT | Status: DC
  Administered 2017-06-28 – 2017-06-29 (×2): 2 via RESPIRATORY_TRACT
  Filled 2017-06-27 (×2): qty 8.8

## 2017-06-27 MED ORDER — PANTOPRAZOLE SODIUM 40 MG PO TBEC
40.0000 mg | DELAYED_RELEASE_TABLET | Freq: Every day | ORAL | Status: DC
  Administered 2017-06-28: 40 mg via ORAL

## 2017-06-27 MED ORDER — GABAPENTIN 300 MG PO CAPS
600.0000 mg | ORAL_CAPSULE | Freq: Three times a day (TID) | ORAL | Status: DC
  Administered 2017-06-27 – 2017-06-29 (×6): 600 mg via ORAL
  Filled 2017-06-27 (×6): qty 2

## 2017-06-27 MED ORDER — MAGNESIUM OXIDE 400 (241.3 MG) MG PO TABS
400.0000 mg | ORAL_TABLET | Freq: Every day | ORAL | Status: DC
  Administered 2017-06-27 – 2017-06-29 (×3): 400 mg via ORAL
  Filled 2017-06-27 (×3): qty 1

## 2017-06-27 MED ORDER — GABAPENTIN 300 MG PO CAPS
600.0000 mg | ORAL_CAPSULE | Freq: Three times a day (TID) | ORAL | Status: DC
  Filled 2017-06-27 (×3): qty 2

## 2017-06-27 MED ORDER — LISINOPRIL 10 MG PO TABS
10.0000 mg | ORAL_TABLET | Freq: Every day | ORAL | Status: DC
  Administered 2017-06-27 – 2017-06-29 (×2): 10 mg via ORAL
  Filled 2017-06-27 (×3): qty 1

## 2017-06-27 MED ORDER — GABAPENTIN 600 MG PO TABS
600.0000 mg | ORAL_TABLET | Freq: Three times a day (TID) | ORAL | Status: DC
  Filled 2017-06-27 (×3): qty 1

## 2017-06-27 MED ORDER — PRAVASTATIN SODIUM 40 MG PO TABS
40.0000 mg | ORAL_TABLET | Freq: Every day | ORAL | Status: DC
  Administered 2017-06-27 – 2017-06-28 (×2): 40 mg via ORAL
  Filled 2017-06-27 (×2): qty 1
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
## 2017-06-27 NOTE — Progress Notes (Signed)
Patient informed this nurse she drinks 1.5 gals of vodka every other day.  Patient states she feels like she is having withdrawal symptoms.  States last drink was yesterday late afternoon (06/26/17), after which she did some cocaine and methamphetamine.  Contact MD for CIWA order and medication to help with alcohol with drawls.  Patient given 1 mg Ativan after scoring 17 on CIWA. Patient currently resting.  Will continue to monitor patient for addition issues or changes.

## 2017-06-27 NOTE — Progress Notes (Addendum)
PROGRESS NOTE    Autumn HaberDebra P Kempe  ZOX:[id number] DOB: [birth year] DOA: 06/26/2017 PCP: Julieanne MansonMulberry, Elizabeth, MD    Brief Narrative:  61 year old female admitted with chest pain with recent substance abuse including alcohol and cocaine use.  She is ruled out for ACS with negative cardiac markers.  She is now developing alcohol withdrawal and is on withdrawal protocol.  She describes her depression as the driving force as to why she has relapsed into substance abuse.  Psychiatry has been consulted to see if she is a candidate for inpatient treatment.   Assessment & Plan:   Principal Problem:   Chest pain Active Problems:   HEPATITIS C   Attention deficit disorder (ADD)   Alcohol abuse   Alcohol dependence with uncomplicated withdrawal (HCC)   Polysubstance abuse (HCC)   Cocaine abuse (HCC)   Depression   Essential hypertension   GERD (gastroesophageal reflux disease)   1. Chest pain.  Atypical.  She is ruled out for ACS with negative cardiac markers.  Suspect this is related to her substance abuse.  No further workup planned at this time. 2. Alcohol abuse with withdrawal.  Currently on CIWA protocol with Ativan.  CIWA score of 7.  She does appear tremulous.  Continue current treatments for now. 3. Hypertension.  On lisinopril and Coreg.  We will hold Coreg with ongoing cocaine use.  Continue lisinopril for now. 4. GERD.  Continue on PPI 5. Polysubstance abuse.  Patient reports that she was previously sober for 9 years but has since relapsed.  [facility] for what she describes as substance abuse related issues.  This was proven to be less effective.  She feels that her depression has been uncontrolled lately and has been driving a lot of her substance abuse.  She denies any suicidal ideations at this time, but feels unsafe returning home since she would likely relapse into her substance abuse habits.  Patient is very tearful during our conversation.  Will  request psychiatry input to see if she is a candidate for inpatient treatment of depression/detox from substance abuse.  [facility], not sure if she would benefit from returning there.  If she is not a candidate for inpatient, she would likely benefit from social work referral for a possible residential program   DVT prophylaxis: Lovenox Code Status: Full code Family Communication: No family present Disposition Plan: Pending psychiatry evaluation   Consultants:     Procedures:     Antimicrobials:       Subjective: Chest pain is doing better.  No shortness of breath.  She is feeling tremulous and is concerned about withdrawals.  She talks about being depressed lately and relapsing into substance abuse.  She is tearful during our conversation and feels that she has been more depressed lately.  This has been driving a lot of her substance abuse.  She denies any suicidal ideations.  Objective: Vitals:   06/27/17 0000 06/27/17 0405 06/27/17 0600 06/27/17 1200  BP: (!) 160/116 (!) 154/95 (!) 156/94 (!) 136/95  Pulse: 98 96 97 (!) 104  Resp:  16  20  Temp:  98.7 F (37.1 C)  98.9 F (37.2 C)  TempSrc:  Oral  Oral  SpO2:  99%  94%  Weight:      Height:        Intake/Output Summary (Last 24 hours) at 06/27/2017 1721 Last data filed at 06/27/2017 1000 Gross per 24 hour  Intake -  Output 350 ml  Net -350 ml   Filed Weights   06/26/17 1813 06/26/17 2205  Weight: 102.1 kg (225 lb) 101.9 kg (224 lb 10.4 oz)    Examination:  General exam: Appears calm and comfortable  Respiratory system: Clear to auscultation. Respiratory effort normal. Cardiovascular system: S1 & S2 heard, tachycardic, no JVD, murmurs, rubs, gallops or clicks. No pedal edema. Gastrointestinal system: Abdomen is nondistended, soft and nontender. No organomegaly or masses felt. Normal bowel sounds heard. Central nervous system: Alert and oriented. No focal neurological deficits.   She is tremulous Extremities: Symmetric 5 x 5 power. Skin: No rashes, lesions or ulcers Psychiatry: Patient appears to be depressed.  She is tearful during our conversation.    Data Reviewed: I have personally reviewed following labs and imaging studies  CBC: Recent Labs  Lab 06/26/17 1833  WBC 7.2  NEUTROABS 5.3  HGB 14.6  HCT 43.1  MCV 86.7  PLT 272   Basic Metabolic Panel: Recent Labs  Lab 06/26/17 1833  NA 140  K 3.5  CL 104  CO2 22  GLUCOSE 118*  BUN 12  CREATININE 0.68  CALCIUM 8.9   GFR: Estimated Creatinine Clearance: 88.5 mL/min (by C-G formula based on SCr of 0.68 mg/dL). Liver Function Tests: Recent Labs  Lab 06/26/17 1833  AST 34  ALT 30  ALKPHOS 92  BILITOT 0.7  PROT 7.5  ALBUMIN 3.6   No results for input(s): LIPASE, AMYLASE in the last 168 hours. No results for input(s): AMMONIA in the last 168 hours. Coagulation Profile: No results for input(s): INR, PROTIME in the last 168 hours. Cardiac Enzymes: Recent Labs  Lab 06/26/17 1833 06/27/17 0016 06/27/17 0353 06/27/17 1032  TROPONINI <0.03 <0.03 <0.03 <0.03   BNP (last 3 results) No results for input(s): PROBNP in the last 8760 hours. HbA1C: No results for input(s): HGBA1C in the last 72 hours. CBG: No results for input(s): GLUCAP in the last 168 hours. Lipid Profile: No results for input(s): CHOL, HDL, LDLCALC, TRIG, CHOLHDL, LDLDIRECT in the last 72 hours. Thyroid Function Tests: No results for input(s): TSH, T4TOTAL, FREET4, T3FREE, THYROIDAB in the last 72 hours. Anemia Panel: No results for input(s): VITAMINB12, FOLATE, FERRITIN, TIBC, IRON, RETICCTPCT in the last 72 hours. Sepsis Labs: No results for input(s): PROCALCITON, LATICACIDVEN in the last 168 hours.  No results found for this or any previous visit (from the past 240 hour(s)).       Radiology Studies: Dg Chest 2 View  Result Date: 06/26/2017 CLINICAL DATA:  61 year old female with chest pain. EXAM: CHEST  2  VIEW COMPARISON:  Chest radiograph dated 09/29/2015 FINDINGS: There is mild emphysematous changes of the lungs with hyperinflation. There is no focal consolidation, pleural effusion, or pneumothorax. The cardiac silhouette is within normal limits. No acute osseous pathology. IMPRESSION: No active cardiopulmonary disease. COPD. Electronically Signed   By: Elgie CollardArash  Radparvar M.D.   On: 06/26/2017 19:11        Scheduled Meds: . amphetamine-dextroamphetamine  30 mg Oral QAC breakfast  . buprenorphine-naloxone  1 tablet Sublingual Daily  . diclofenac  75 mg Oral BID  . DULoxetine  60 mg Oral Daily  . enoxaparin (LOVENOX) injection  40 mg Subcutaneous Q24H  . folic acid  1 mg Oral Daily  . gabapentin  600 mg Oral TID  . lisinopril  10 mg Oral Daily  . magnesium oxide  400 mg Oral Daily  . mometasone-formoterol  2 puff Inhalation BID  .  multivitamin with minerals  1 tablet Oral Daily  . nitroGLYCERIN  0.5 inch Topical Q6H  . pantoprazole  40 mg Oral Daily  . pantoprazole  40 mg Oral Daily  . pravastatin  40 mg Oral QHS  . thiamine  100 mg Oral Daily   Or  . thiamine  100 mg Intravenous Daily  . traZODone  100 mg Oral QHS   Continuous Infusions:   LOS: 0 days    Time spent:    Erick Blinks, MD [facility] Pager [phone]  If 7PM-7AM, please contact night-coverage [email or web address] Password [email or web address] 06/27/2017, 5:21 PM

## 2017-06-28 DIAGNOSIS — R079 Chest pain, unspecified: Secondary | ICD-10-CM

## 2017-06-28 DIAGNOSIS — F10230 Alcohol dependence with withdrawal, uncomplicated: Secondary | ICD-10-CM | POA: Diagnosis not present

## 2017-06-28 DIAGNOSIS — R0789 Other chest pain: Secondary | ICD-10-CM | POA: Diagnosis not present

## 2017-06-28 LAB — HIV ANTIBODY (ROUTINE TESTING W REFLEX): HIV SCREEN 4TH GENERATION: NONREACTIVE

## 2017-06-28 MED ORDER — FOLIC ACID 1 MG PO TABS: 1.0000 mg | ORAL_TABLET | Freq: Every day | ORAL | Status: DC

## 2017-06-28 MED ORDER — MAGNESIUM OXIDE 400 (241.3 MG) MG PO TABS: 400.0000 mg | ORAL_TABLET | Freq: Every day | ORAL | 2 refills | Status: DC

## 2017-06-28 MED ORDER — THIAMINE HCL 100 MG PO TABS: 100.0000 mg | ORAL_TABLET | Freq: Every day | ORAL | 11 refills | Status: DC

## 2017-06-28 MED ORDER — HYDROXYZINE HCL 25 MG PO TABS
25.0000 mg | ORAL_TABLET | Freq: Four times a day (QID) | ORAL | Status: DC | PRN
  Administered 2017-06-29: 25 mg via ORAL
  Filled 2017-06-28: qty 1

## 2017-06-28 MED ORDER — BUPRENORPHINE HCL-NALOXONE HCL 8-2 MG SL SUBL: 1.0000 | SUBLINGUAL_TABLET | Freq: Every day | SUBLINGUAL | Status: DC

## 2017-06-28 MED ORDER — AMPHETAMINE-DEXTROAMPHET ER 30 MG PO CP24: 30.0000 mg | ORAL_CAPSULE | Freq: Every day | ORAL | 0 refills | Status: DC

## 2017-06-28 NOTE — Discharge Summary (Signed)
Physician Discharge Summary  Autumn [name] BJY:[id number] DOB: May 06, 1956 DOA: 06/26/2017  PCP: Julieanne Manson, MD  Admit date: 06/26/2017 Discharge date: 06/28/2017  Time spent: 45 minutes  Recommendations for Outpatient Follow-up:  -Will be discharged home today. -Advised to follow-up with PCP in 2 weeks.  Discharge Diagnoses:  Principal Problem:   Chest pain Active Problems:   HEPATITIS C   Attention deficit disorder (ADD)   Alcohol abuse   Alcohol dependence with uncomplicated withdrawal (HCC)   Polysubstance abuse (HCC)   Cocaine abuse (HCC)   Depression   Essential hypertension   GERD (gastroesophageal reflux disease)   Discharge Condition: Stable and improved  Filed Weights   06/26/17 1813 06/26/17 2205  Weight: 102.1 kg (225 lb) 101.9 kg (224 lb 10.4 oz)    History of present illness:  As per Dr. Sharl Ma on 11/17: Autumn [name]  is a 61 y.o. female,.. With history of COPD, ADHD, depression, fibromyalgia came to hospital with chest pain.  Patient states that she has been drinking a lot of alcohol over the past few days.  Also did cocaine and methamphetamine in the past few days. She denies shortness of breath.  The pain is intermittent.  Does not radiate. Patient says that she had a cardiac cath in 2017 which did not show significant blockage in the coronary arteries. 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  Denies nausea vomiting or diarrhea. No coughing up any phlegm. No fever or chills. No history of dysuria.    Hospital Course:   1. Chest pain.  Atypical.  She is ruled out for ACS with negative cardiac markers.  Suspect this is related to her substance abuse.  No further workup planned at this time. 2. Alcohol abuse with withdrawal.  Currently on CIWA protocol with Ativan.    CIWA scores have trended down, currently not exhibiting active signs of withdrawal. 3. Hypertension.  On lisinopril and Coreg.     4. GERD.  Continue on PPI 5.   Polysubstance  abuse.  Patient reports that she was previously sober for 9 years but has since relapsed.  She recently had a stay at [facility] for what she describes as substance abuse related issues.   She feels that her depression has been uncontrolled lately and has been driving a lot of her substance abuse.  She denies any suicidal ideations at this time, but feels unsafe returning home since she would likely relapse into her substance abuse habits.  Patient is very tearful during our conversation.    Seen by psychiatry who did not believe she required inpatient psychiatric stay.  Also seen in consultation by social work and apparently were not able to place her directly from the hospital to an inpatient rehab.  Social worker has provided patient with resources.   Procedures:  None  Consultations:  None  Discharge Instructions  Discharge Instructions    Diet - low sodium heart healthy   Complete by:  As directed    Increase activity slowly   Complete by:  As directed      Allergies as of 06/28/2017      Reactions   Doxycycline Other (See Comments)      Medication List    TAKE these medications   amphetamine-dextroamphetamine 30 MG 24 hr capsule Commonly known as:  ADDERALL XR Take 1 capsule (30 mg total) daily before breakfast by mouth. Start taking on:  06/29/2017 What changed:  when to take this   buprenorphine-naloxone 8-2  mg Subl SL tablet Commonly known as:  SUBOXONE Place 1 tablet daily under the tongue. What changed:  additional instructions   carvedilol 12.5 MG tablet Commonly known as:  COREG Take 12.5 mg 2 (two) times daily by mouth.   chlordiazePOXIDE 25 MG capsule Commonly known as:  LIBRIUM 50mg  PO TID x 1D, then 25-50mg  PO BID X 1D, then 25-50mg  PO QD X 1D What changed:    how much to take  how to take this  when to take this  additional instructions   diclofenac 75 MG EC tablet Commonly known as:  VOLTAREN Take 1 tablet 2 (two) times daily by mouth.     DULoxetine 60 MG capsule Commonly known as:  CYMBALTA Take 60 mg daily by mouth.   fluticasone-salmeterol 115-21 MCG/ACT inhaler Commonly known as:  ADVAIR HFA Inhale 2 puffs 2 (two) times daily into the lungs.   folic acid 1 MG tablet Commonly known as:  FOLVITE Take 1 tablet (1 mg total) daily by mouth. Start taking on:  06/29/2017   gabapentin 600 MG tablet Commonly known as:  NEURONTIN Take 600 mg 3 (three) times daily by mouth.   hydrOXYzine 25 MG capsule Commonly known as:  VISTARIL Take 1 capsule 4 (four) times daily as needed by mouth.   lisinopril 10 MG tablet Commonly known as:  PRINIVIL,ZESTRIL Take 10 mg daily by mouth.   magnesium oxide 400 (241.3 Mg) MG tablet Commonly known as:  MAG-OX Take 1 tablet (400 mg total) daily by mouth. Start taking on:  06/29/2017   multivitamin with minerals Tabs tablet Take 1 tablet by mouth every morning.   omeprazole 20 MG capsule Commonly known as:  PRILOSEC Take 20 mg daily by mouth.   pravastatin 40 MG tablet Commonly known as:  PRAVACHOL Take 40 mg at bedtime by mouth.   PROBIOTIC DAILY PO Take 1 tablet by mouth every morning.   thiamine 100 MG tablet Take 1 tablet (100 mg total) daily by mouth. Start taking on:  06/29/2017   traZODone 100 MG tablet Commonly known as:  DESYREL Take 100 mg at bedtime by mouth.      Allergies  Allergen Reactions  . Doxycycline Other (See Comments)   Follow-up Information    Julieanne MansonMulberry, Elizabeth, MD. Schedule an appointment as soon as possible for a visit in 2 weeks.   Specialty:  Internal Medicine Contact information: [address] [place] [address] [phone]            The results of significant diagnostics from this hospitalization (including imaging, microbiology, ancillary and laboratory) are listed below for reference.    Significant Diagnostic Studies: Dg Chest 2 View  Result Date: 06/26/2017 CLINICAL DATA:  61 year old female with chest  pain. EXAM: CHEST  2 VIEW COMPARISON:  Chest radiograph dated 09/29/2015 FINDINGS: There is mild emphysematous changes of the lungs with hyperinflation. There is no focal consolidation, pleural effusion, or pneumothorax. The cardiac silhouette is within normal limits. No acute osseous pathology. IMPRESSION: No active cardiopulmonary disease. COPD. Electronically Signed   By: Elgie CollardArash  Radparvar M.D.   On: 06/26/2017 19:11    Microbiology: No results found for this or any previous visit (from the past 240 hour(s)).   Labs: Basic Metabolic Panel: Recent Labs  Lab 06/26/17 1833  NA 140  K 3.5  CL 104  CO2 22  GLUCOSE 118*  BUN 12  CREATININE 0.68  CALCIUM 8.9   Liver Function Tests: Recent Labs  Lab 06/26/17 1833  AST 34  ALT 30  ALKPHOS 92  BILITOT 0.7  PROT 7.5  ALBUMIN 3.6   No results for input(s): LIPASE, AMYLASE in the last 168 hours. No results for input(s): AMMONIA in the last 168 hours. CBC: Recent Labs  Lab 06/26/17 1833  WBC 7.2  NEUTROABS 5.3  HGB 14.6  HCT 43.1  MCV 86.7  PLT 272   Cardiac Enzymes: Recent Labs  Lab 06/26/17 1833 06/27/17 0016 06/27/17 0353 06/27/17 1032  TROPONINI <0.03 <0.03 <0.03 <0.03   BNP: BNP (last 3 results) No results for input(s): BNP in the last 8760 hours.  ProBNP (last 3 results) No results for input(s): PROBNP in the last 8760 hours.  CBG: No results for input(s): GLUCAP in the last 168 hours.     Signed:  Chaya Janstela Hernandez Acosta  [facility] Pager: [phone] 06/28/2017, 5:37 PM

## 2017-06-28 NOTE — Progress Notes (Signed)
Pt has been d/c but states that she has no where to go. She is going to call her pastor.

## 2017-06-28 NOTE — Progress Notes (Signed)
Pt removed her IV by herself. She also removed her cardiac monitoring and refused to reconnect.

## 2017-06-28 NOTE — Clinical Social Work Note (Signed)
Patient graduated from [facility] [place] in July of this year. She previously was abstinent from substances for nine years.  Patient is interested in seeking additional SA treatment at a different facility. She states that she will have to go back some where where she can still take her medications, treat her addictions, heal from all she has been through and get SA treatment.  LCSW contacted [facility] and spoke with [place]. She advised the program was a detox program only. She provided LCSW with a list of SA treatment programs via email. LCSW provided the list to patient and advised her to call facilities to identify which facilities had openings and availability.     Estefany Goebel, Juleen ChinaHeather D, LCSW

## 2017-06-29 DIAGNOSIS — R0789 Other chest pain: Secondary | ICD-10-CM | POA: Diagnosis not present

## 2017-06-29 NOTE — Clinical Social Work Note (Signed)
LCSW met with patient to explore progress made regarding facilities for SA treatment. Patient stated that he had not contacted any of the facilities, but had highlighted some she was interested in.  LCSW provided the transitional housing list and the homeless shelter list. Patient was very easily distracted and discussed multiple issues that had lead to her current circumstances.  LCSW provided encouragement and assisted patient in identifying possible facilities/housing options. Patient stated that if she could not locate a facility nor housing she would return to her friend's trailer.  After discussion with Director of Nursing, patient agreed to continue to search for housing options and that if she had not found anything by 4:30 she would return to her friend's house via hospital arranged cab.     Sharisse Rantz, Clydene Pugh, LCSW

## 2017-06-29 NOTE — Progress Notes (Signed)
Patient has not been seen or assessed today.  She was discharged yesterday, apparently discharge order was held due to social issues.  Patient remains medically clear for discharge.  Social worker has been into see patient and has provided with transitional housing and homeless shelter lists.  Autumn PittEstela Hernandez, MD [facility] Pager: [phone]

## 2017-06-29 NOTE — Progress Notes (Signed)
Pt discharged home today per Dr. Ardyth HarpsHernandez. Pt left floor via WC in stable condition accompanied by NT. VSS for discharge. Paperwork and scripts given to patient. Verbalized understanding.

## 2018-02-17 ENCOUNTER — Ambulatory Visit (HOSPITAL_COMMUNITY): Payer: Self-pay | Admitting: Psychiatry

## 2021-01-11 ENCOUNTER — Other Ambulatory Visit: Payer: Self-pay

## 2021-01-11 ENCOUNTER — Encounter (HOSPITAL_COMMUNITY): Payer: Self-pay | Admitting: Internal Medicine

## 2021-01-11 ENCOUNTER — Inpatient Hospital Stay (HOSPITAL_COMMUNITY)
Admission: EM | Admit: 2021-01-11 | Discharge: 2021-01-15 | DRG: 309 | Disposition: A | Discharge status: Home / Self Care | Payer: 59 | Attending: Internal Medicine | Admitting: Internal Medicine

## 2021-01-11 ENCOUNTER — Emergency Department (HOSPITAL_COMMUNITY): Payer: 59

## 2021-01-11 DIAGNOSIS — G894 Chronic pain syndrome: Secondary | ICD-10-CM | POA: Diagnosis present

## 2021-01-11 DIAGNOSIS — I959 Hypotension, unspecified: Secondary | ICD-10-CM | POA: Diagnosis not present

## 2021-01-11 DIAGNOSIS — Z881 Allergy status to other antibiotic agents status: Secondary | ICD-10-CM | POA: Diagnosis not present

## 2021-01-11 DIAGNOSIS — Z888 Allergy status to other drugs, medicaments and biological substances status: Secondary | ICD-10-CM

## 2021-01-11 DIAGNOSIS — I2510 Atherosclerotic heart disease of native coronary artery without angina pectoris: Secondary | ICD-10-CM | POA: Diagnosis present

## 2021-01-11 DIAGNOSIS — F32A Depression, unspecified: Secondary | ICD-10-CM | POA: Diagnosis present

## 2021-01-11 DIAGNOSIS — Z5900 Homelessness unspecified: Secondary | ICD-10-CM | POA: Diagnosis not present

## 2021-01-11 DIAGNOSIS — E785 Hyperlipidemia, unspecified: Secondary | ICD-10-CM | POA: Diagnosis present

## 2021-01-11 DIAGNOSIS — R002 Palpitations: Secondary | ICD-10-CM | POA: Diagnosis present

## 2021-01-11 DIAGNOSIS — F17210 Nicotine dependence, cigarettes, uncomplicated: Secondary | ICD-10-CM | POA: Diagnosis present

## 2021-01-11 DIAGNOSIS — Z6841 Body Mass Index (BMI) 40.0 and over, adult: Secondary | ICD-10-CM

## 2021-01-11 DIAGNOSIS — Z7901 Long term (current) use of anticoagulants: Secondary | ICD-10-CM

## 2021-01-11 DIAGNOSIS — I480 Paroxysmal atrial fibrillation: Secondary | ICD-10-CM | POA: Diagnosis present

## 2021-01-11 DIAGNOSIS — I10 Essential (primary) hypertension: Secondary | ICD-10-CM | POA: Diagnosis present

## 2021-01-11 DIAGNOSIS — D638 Anemia in other chronic diseases classified elsewhere: Secondary | ICD-10-CM | POA: Diagnosis present

## 2021-01-11 DIAGNOSIS — M797 Fibromyalgia: Secondary | ICD-10-CM | POA: Diagnosis present

## 2021-01-11 DIAGNOSIS — Z955 Presence of coronary angioplasty implant and graft: Secondary | ICD-10-CM

## 2021-01-11 DIAGNOSIS — Z87898 Personal history of other specified conditions: Secondary | ICD-10-CM | POA: Diagnosis present

## 2021-01-11 DIAGNOSIS — K5900 Constipation, unspecified: Secondary | ICD-10-CM | POA: Diagnosis not present

## 2021-01-11 DIAGNOSIS — F419 Anxiety disorder, unspecified: Secondary | ICD-10-CM | POA: Diagnosis present

## 2021-01-11 DIAGNOSIS — Z7902 Long term (current) use of antithrombotics/antiplatelets: Secondary | ICD-10-CM

## 2021-01-11 DIAGNOSIS — F1120 Opioid dependence, uncomplicated: Secondary | ICD-10-CM | POA: Diagnosis present

## 2021-01-11 DIAGNOSIS — Z20822 Contact with and (suspected) exposure to covid-19: Secondary | ICD-10-CM | POA: Diagnosis present

## 2021-01-11 DIAGNOSIS — F909 Attention-deficit hyperactivity disorder, unspecified type: Secondary | ICD-10-CM | POA: Diagnosis present

## 2021-01-11 DIAGNOSIS — I4891 Unspecified atrial fibrillation: Secondary | ICD-10-CM | POA: Diagnosis not present

## 2021-01-11 DIAGNOSIS — J441 Chronic obstructive pulmonary disease with (acute) exacerbation: Secondary | ICD-10-CM | POA: Diagnosis present

## 2021-01-11 DIAGNOSIS — Z79899 Other long term (current) drug therapy: Secondary | ICD-10-CM

## 2021-01-11 DIAGNOSIS — F1910 Other psychoactive substance abuse, uncomplicated: Secondary | ICD-10-CM | POA: Diagnosis not present

## 2021-01-11 DIAGNOSIS — F17200 Nicotine dependence, unspecified, uncomplicated: Secondary | ICD-10-CM | POA: Diagnosis present

## 2021-01-11 DIAGNOSIS — K219 Gastro-esophageal reflux disease without esophagitis: Secondary | ICD-10-CM | POA: Diagnosis present

## 2021-01-11 HISTORY — DX: Unspecified atrial fibrillation: I48.91

## 2021-01-11 LAB — BASIC METABOLIC PANEL
Anion gap: 9 (ref 5–15)
BUN: <5 mg/dL — ABNORMAL LOW (ref 8–23)
CO2: 28 mmol/L (ref 22–32)
Calcium: 8.7 mg/dL — ABNORMAL LOW (ref 8.9–10.3)
Chloride: 101 mmol/L (ref 98–111)
Creatinine, Ser: 0.7 mg/dL (ref 0.44–1.00)
GFR, Estimated: >60 mL/min (ref >60)
Glucose, Bld: 117 mg/dL — ABNORMAL HIGH (ref 70–99)
Potassium: 4.1 mmol/L (ref 3.5–5.1)
Sodium: 138 mmol/L (ref 135–145)

## 2021-01-11 LAB — CBC WITH DIFFERENTIAL/PLATELET
Abs Immature Granulocytes: 0.03 10*3/uL (ref 0.00–0.07)
Basophils Absolute: 0 10*3/uL (ref 0.0–0.1)
Basophils Relative: 1 %
Eosinophils Absolute: 0.2 10*3/uL (ref 0.0–0.5)
Eosinophils Relative: 2 %
HCT: 36.5 % (ref 36.0–46.0)
Hemoglobin: 11.5 g/dL — ABNORMAL LOW (ref 12.0–15.0)
Immature Granulocytes: 0 %
Lymphocytes Relative: 25 %
Lymphs Abs: 2.2 10*3/uL (ref 0.7–4.0)
MCH: 27.5 pg (ref 26.0–34.0)
MCHC: 31.5 g/dL (ref 30.0–36.0)
MCV: 87.3 fL (ref 80.0–100.0)
Monocytes Absolute: 0.6 10*3/uL (ref 0.1–1.0)
Monocytes Relative: 7 %
Neutro Abs: 5.7 10*3/uL (ref 1.7–7.7)
Neutrophils Relative %: 65 %
Platelets: 326 10*3/uL (ref 150–400)
RBC: 4.18 MIL/uL (ref 3.87–5.11)
RDW: 17.4 % — ABNORMAL HIGH (ref 11.5–15.5)
WBC: 8.7 10*3/uL (ref 4.0–10.5)
nRBC: 0 % (ref 0.0–0.2)

## 2021-01-11 LAB — RESP PANEL BY RT-PCR (FLU A&B, COVID) ARPGX2
Influenza A by PCR: NEGATIVE
Influenza B by PCR: NEGATIVE
SARS Coronavirus 2 by RT PCR: NEGATIVE

## 2021-01-11 LAB — HIV ANTIBODY (ROUTINE TESTING W REFLEX): HIV Screen 4th Generation wRfx: NONREACTIVE

## 2021-01-11 LAB — MAGNESIUM: Magnesium: 2 mg/dL (ref 1.7–2.4)

## 2021-01-11 MED ORDER — PROPOFOL 10 MG/ML IV BOLUS
INTRAVENOUS | Status: AC | PRN
  Administered 2021-01-11: 20 mg via INTRAVENOUS

## 2021-01-11 MED ORDER — PROPOFOL 10 MG/ML IV BOLUS
0.5000 mg/kg | Freq: Once | INTRAVENOUS | Status: AC
  Administered 2021-01-11: 40 mg via INTRAVENOUS
  Filled 2021-01-11: qty 20

## 2021-01-11 MED ORDER — TRAZODONE HCL 100 MG PO TABS: 100.0000 mg | ORAL_TABLET | Freq: Every evening | ORAL | Status: DC | PRN

## 2021-01-11 MED ORDER — DULOXETINE HCL 60 MG PO CPEP
60.0000 mg | ORAL_CAPSULE | Freq: Every day | ORAL | Status: DC
  Administered 2021-01-11 – 2021-01-15 (×5): 60 mg via ORAL
  Filled 2021-01-11 (×5): qty 1

## 2021-01-11 MED ORDER — CLOPIDOGREL BISULFATE 75 MG PO TABS
75.0000 mg | ORAL_TABLET | Freq: Every day | ORAL | Status: DC
  Administered 2021-01-11 – 2021-01-15 (×5): 75 mg via ORAL
  Filled 2021-01-11 (×5): qty 1

## 2021-01-11 MED ORDER — LISINOPRIL 10 MG PO TABS
10.0000 mg | ORAL_TABLET | Freq: Every day | ORAL | Status: DC
  Administered 2021-01-11 – 2021-01-12 (×2): 10 mg via ORAL
  Filled 2021-01-11 (×2): qty 1

## 2021-01-11 MED ORDER — ARIPIPRAZOLE 5 MG PO TABS
10.0000 mg | ORAL_TABLET | Freq: Every day | ORAL | Status: DC
  Administered 2021-01-11 – 2021-01-15 (×5): 10 mg via ORAL
  Filled 2021-01-11 (×5): qty 2

## 2021-01-11 MED ORDER — NICOTINE 14 MG/24HR TD PT24
14.0000 mg | MEDICATED_PATCH | Freq: Every day | TRANSDERMAL | Status: DC
  Administered 2021-01-11 – 2021-01-15 (×5): 14 mg via TRANSDERMAL
  Filled 2021-01-11 (×5): qty 1

## 2021-01-11 MED ORDER — APIXABAN 5 MG PO TABS
5.0000 mg | ORAL_TABLET | Freq: Two times a day (BID) | ORAL | Status: DC
  Administered 2021-01-11 – 2021-01-15 (×9): 5 mg via ORAL
  Filled 2021-01-11 (×9): qty 1

## 2021-01-11 MED ORDER — ACETAMINOPHEN 325 MG PO TABS
650.0000 mg | ORAL_TABLET | ORAL | Status: DC | PRN
  Administered 2021-01-12: 650 mg via ORAL
  Filled 2021-01-11: qty 2

## 2021-01-11 MED ORDER — ALBUTEROL SULFATE (2.5 MG/3ML) 0.083% IN NEBU
3.0000 mL | INHALATION_SOLUTION | RESPIRATORY_TRACT | Status: DC | PRN
  Administered 2021-01-12 – 2021-01-13 (×2): 3 mL via RESPIRATORY_TRACT
  Filled 2021-01-11 (×2): qty 3

## 2021-01-11 MED ORDER — PREGABALIN 25 MG PO CAPS
50.0000 mg | ORAL_CAPSULE | Freq: Three times a day (TID) | ORAL | Status: DC
  Administered 2021-01-11 – 2021-01-15 (×11): 50 mg via ORAL
  Filled 2021-01-11 (×13): qty 2

## 2021-01-11 MED ORDER — UMECLIDINIUM BROMIDE 62.5 MCG/INH IN AEPB
1.0000 | INHALATION_SPRAY | Freq: Every day | RESPIRATORY_TRACT | Status: DC
  Administered 2021-01-11 – 2021-01-15 (×5): 1 via RESPIRATORY_TRACT
  Filled 2021-01-11: qty 7

## 2021-01-11 MED ORDER — DILTIAZEM LOAD VIA INFUSION
20.0000 mg | Freq: Once | INTRAVENOUS | Status: AC
  Administered 2021-01-11: 20 mg via INTRAVENOUS
  Filled 2021-01-11: qty 20

## 2021-01-11 MED ORDER — FUROSEMIDE 20 MG PO TABS
20.0000 mg | ORAL_TABLET | Freq: Every day | ORAL | Status: DC
  Administered 2021-01-11 – 2021-01-13 (×3): 20 mg via ORAL
  Filled 2021-01-11 (×3): qty 1

## 2021-01-11 MED ORDER — PANTOPRAZOLE SODIUM 40 MG PO TBEC
40.0000 mg | DELAYED_RELEASE_TABLET | Freq: Every day | ORAL | Status: DC
  Administered 2021-01-11 – 2021-01-15 (×5): 40 mg via ORAL
  Filled 2021-01-11 (×5): qty 1

## 2021-01-11 MED ORDER — BUPRENORPHINE HCL-NALOXONE HCL 8-2 MG SL SUBL
1.0000 | SUBLINGUAL_TABLET | Freq: Every day | SUBLINGUAL | Status: DC
  Administered 2021-01-11 – 2021-01-15 (×5): 1 via SUBLINGUAL
  Filled 2021-01-11 (×5): qty 1

## 2021-01-11 MED ORDER — BUSPIRONE HCL 5 MG PO TABS
10.0000 mg | ORAL_TABLET | Freq: Two times a day (BID) | ORAL | Status: DC
  Administered 2021-01-11 – 2021-01-15 (×9): 10 mg via ORAL
  Filled 2021-01-11 (×9): qty 2

## 2021-01-11 MED ORDER — CLONIDINE HCL 0.1 MG PO TABS
0.1000 mg | ORAL_TABLET | Freq: Two times a day (BID) | ORAL | Status: DC
  Administered 2021-01-11 – 2021-01-12 (×2): 0.1 mg via ORAL
  Filled 2021-01-11 (×4): qty 1

## 2021-01-11 MED ORDER — CYCLOBENZAPRINE HCL 10 MG PO TABS: 10.0000 mg | ORAL_TABLET | Freq: Three times a day (TID) | ORAL | Status: DC | PRN

## 2021-01-11 MED ORDER — SODIUM CHLORIDE 0.9 % IV BOLUS
1000.0000 mL | Freq: Once | INTRAVENOUS | Status: AC
  Administered 2021-01-11: 1000 mL via INTRAVENOUS

## 2021-01-11 MED ORDER — ATORVASTATIN CALCIUM 40 MG PO TABS
40.0000 mg | ORAL_TABLET | Freq: Every day | ORAL | Status: DC
  Administered 2021-01-11 – 2021-01-15 (×5): 40 mg via ORAL
  Filled 2021-01-11 (×5): qty 1

## 2021-01-11 MED ORDER — METOPROLOL SUCCINATE ER 50 MG PO TB24
50.0000 mg | ORAL_TABLET | Freq: Every day | ORAL | Status: DC
  Administered 2021-01-11 – 2021-01-15 (×4): 50 mg via ORAL
  Filled 2021-01-11 (×4): qty 1

## 2021-01-11 MED ORDER — ONDANSETRON HCL 4 MG/2ML IJ SOLN: 4.0000 mg | Freq: Four times a day (QID) | INTRAMUSCULAR | Status: DC | PRN

## 2021-01-11 MED ORDER — DILTIAZEM HCL-DEXTROSE 125-5 MG/125ML-% IV SOLN (PREMIX)
5.0000 mg/h | INTRAVENOUS | Status: DC
  Administered 2021-01-11: 15 mg/h via INTRAVENOUS

## 2021-01-11 MED ORDER — DILTIAZEM HCL-DEXTROSE 125-5 MG/125ML-% IV SOLN (PREMIX)
5.0000 mg/h | INTRAVENOUS | Status: DC
  Administered 2021-01-11: 5 mg/h via INTRAVENOUS
  Administered 2021-01-11: 15 mg/h via INTRAVENOUS
  Filled 2021-01-11 (×2): qty 125

## 2021-01-11 MED ORDER — TIOTROPIUM BROMIDE MONOHYDRATE 1.25 MCG/ACT IN AERS: 2.0000 | INHALATION_SPRAY | Freq: Every day | RESPIRATORY_TRACT | Status: DC

## 2021-01-11 NOTE — ED Notes (Signed)
I tried to call report.  Floor nurse - Aisha, RN to call me back asap

## 2021-01-11 NOTE — Sedation Documentation (Signed)
Pt states that she will ride home with family

## 2021-01-11 NOTE — ED Notes (Signed)
I am moving pt from ED 26 to 6E-21

## 2021-01-11 NOTE — Sedation Documentation (Signed)
Shock delivered by Dr. Floyd 

## 2021-01-11 NOTE — ED Provider Notes (Signed)
[facility] EMERGENCY DEPARTMENT Provider Note   CSN: [id number] Arrival date & time: 01/11/21  0203     History No chief complaint on file.   Autumn [name] is a 65 y.o. female.  65 yo F with a cc of palpitations.  Patient was at rest when it started, hx of A fib, feels the same.  Denies recent illness, denies cough, congestion, fever.    The history is provided by the patient.  Illness Severity:  Moderate Onset quality:  Gradual Duration:  6 hours Timing:  Constant Progression:  Worsening Chronicity:  New Associated symptoms: no chest pain, no congestion, no fever, no headaches, no myalgias, no nausea, no rhinorrhea, no shortness of breath, no vomiting and no wheezing        Past Medical History:  Diagnosis Date  . Anxiety   . Arthritis   . Attention deficit disorder (ADD)   . COPD (chronic obstructive pulmonary disease) (HCC)   . Depression   . Drug abuse (HCC)   . Fibromyalgia   . Hyperlipidemia     Patient Active Problem List   Diagnosis Date Noted  . Atrial fibrillation with RVR (HCC) 01/11/2021  . Alcohol abuse 06/27/2017  . Alcohol dependence with uncomplicated withdrawal (HCC) 06/27/2017  . Polysubstance abuse (HCC) 06/27/2017  . Cocaine abuse (HCC) 06/27/2017  . Depression 06/27/2017  . Essential hypertension 06/27/2017  . GERD (gastroesophageal reflux disease) 06/27/2017  . Chest pain 06/26/2017  . Attention deficit disorder (ADD)   . Drug abuse (HCC)   . SINUSITIS, ACUTE 10/11/2007  . ONYCHOMYCOSIS, TOENAILS 06/28/2007  . HEPATITIS C 05/26/2007  . SUBSTANCE ABUSE, MULTIPLE 05/26/2007  . CIRRHOSIS 05/26/2007  . LOW BACK PAIN SYNDROME 05/26/2007  . TROCHANTERIC BURSITIS, LEFT 05/26/2007  . INSOMNIA 05/13/2007  . HOARSENESS, CHRONIC 05/13/2007  . CARPAL TUNNEL SYNDROME, BILATERAL 12/02/2006  . DIVERTICULOSIS, COLON 06/01/2006  . RECTAL BLEEDING, HX OF 04/10/2006    Past Surgical History:  Procedure Laterality Date  .  ANKLE SURGERY    . FACIAL COSMETIC SURGERY       OB History   No obstetric history on file.     No family history on file.  Social History   Tobacco Use  . Smoking status: Current Some Day Smoker    Packs/day: 0.50    Types: Cigarettes  . Smokeless tobacco: Never Used  Vaping Use  . Vaping Use: Every day  Substance Use Topics  . Alcohol use: Yes    Comment: A fifth of vodka a day  . Drug use: Yes    Types: Cocaine, Marijuana    Comment: used cocaine and marijuana yesterday    Home Medications Prior to Admission medications   Medication Sig Start Date End Date Taking? Authorizing Provider  clopidogrel (PLAVIX) 75 MG tablet Take 75 mg by mouth daily. 12/18/20 12/18/21 Yes [provider]  cyanocobalamin 100 MCG tablet Take 100 mcg by mouth daily. 12/22/19  Yes [provider]  naloxone (NARCAN) nasal spray 4 mg/0.1 mL Place 4 mg into the nose as needed. 10/06/19  Yes [provider]  albuterol (PROVENTIL) (2.5 MG/3ML) 0.083% nebulizer solution Inhale 3 mLs into the lungs every 4 (four) hours as needed for shortness of breath or wheezing. 08/22/20   [provider]  albuterol (VENTOLIN HFA) 108 (90 Base) MCG/ACT inhaler Inhale 2 puffs into the lungs every 4 (four) hours as needed. 11/25/20   [provider]  amphetamine-dextroamphetamine (ADDERALL XR) 30 MG 24 hr capsule  Take 1 capsule (30 mg total) daily before breakfast by mouth. 06/29/17   Philip AspenHernandez Acosta, Limmie PatriciaEstela Y, MD  amphetamine-dextroamphetamine (ADDERALL) 20 MG tablet Take 20 mg by mouth 3 (three) times daily. 12/25/20   [provider]  apixaban (ELIQUIS) 5 MG TABS tablet Take 5 mg by mouth in the morning and at bedtime. 09/05/20   [provider]  ARIPiprazole (ABILIFY) 10 MG tablet Take 10 mg by mouth daily. 10/18/20   [provider]  atorvastatin (LIPITOR) 40 MG tablet Take 1 tablet by mouth daily. 11/25/20   [provider]   buprenorphine-naloxone (SUBOXONE) 8-2 mg SUBL SL tablet Place 1 tablet daily under the tongue. 06/28/17   Philip AspenHernandez Acosta, Limmie PatriciaEstela Y, MD  busPIRone (BUSPAR) 10 MG tablet Take 10 mg by mouth in the morning and at bedtime. 04/11/20   [provider]  carvedilol (COREG) 12.5 MG tablet Take 12.5 mg 2 (two) times daily by mouth. 05/28/17   [provider]  chlordiazePOXIDE (LIBRIUM) 25 MG capsule 50mg  PO TID x 1D, then 25-50mg  PO BID X 1D, then 25-50mg  PO QD X 1D Patient not taking: Reported on 01/11/2021 05/20/17   Azalia Bilisampos, Kevin, MD  cloNIDine (CATAPRES) 0.1 MG tablet Take 0.1 mg by mouth in the morning and at bedtime.    [provider]  cyclobenzaprine (FLEXERIL) 10 MG tablet Take 10 mg by mouth 3 (three) times daily as needed. 12/27/20   [provider]  diclofenac Sodium (VOLTAREN) 1 % GEL Apply 2-4 g topically 4 (four) times daily as needed. 12/18/20   [provider]  DULoxetine (CYMBALTA) 60 MG capsule Take 60 mg daily by mouth. 04/09/17   [provider]  folic acid (FOLVITE) 1 MG tablet Take 1 tablet (1 mg total) daily by mouth. 06/29/17   Philip AspenHernandez Acosta, Limmie PatriciaEstela Y, MD  furosemide (LASIX) 20 MG tablet Take 20 mg by mouth daily. 12/27/20   [provider]  gabapentin (NEURONTIN) 600 MG tablet Take 600 mg 3 (three) times daily by mouth. 05/28/17   [provider]  lisinopril (PRINIVIL,ZESTRIL) 10 MG tablet Take 10 mg daily by mouth. 05/28/17   [provider]  magnesium oxide (MAG-OX) 400 (241.3 Mg) MG tablet Take 1 tablet (400 mg total) daily by mouth. 06/29/17   Philip AspenHernandez Acosta, Limmie PatriciaEstela Y, MD  metoprolol succinate (TOPROL-XL) 25 MG 24 hr tablet Take 75 mg by mouth daily. 11/25/20   [provider]  Multiple Vitamin (MULTIVITAMIN WITH MINERALS) TABS Take 1 tablet by mouth every morning.     [provider]  Multiple Vitamin tablet Take 1 tablet by mouth daily.    [provider]  omeprazole  (PRILOSEC) 20 MG capsule Take 20 mg daily by mouth. 10/20/16   [provider]  pravastatin (PRAVACHOL) 40 MG tablet Take 40 mg at bedtime by mouth. 06/04/17   [provider]  predniSONE (DELTASONE) 20 MG tablet Take 20 mg by mouth See admin instructions. Bid x 5 days 12/25/20   [provider]  pregabalin (LYRICA) 50 MG capsule Take 50 mg by mouth 3 (three) times daily. 12/25/20   [provider]  Probiotic Product (PROBIOTIC DAILY PO) Take 1 tablet by mouth every morning.     [provider]  SPIRIVA RESPIMAT 1.25 MCG/ACT AERS Inhale 2 puffs into the lungs daily. 11/25/20   [provider]  thiamine 100 MG tablet Take 1 tablet (100 mg total) daily by mouth. 06/29/17   Philip AspenHernandez Acosta, Limmie PatriciaEstela Y, MD  traZODone (DESYREL) 100 MG tablet Take 100 mg at bedtime by mouth.    [provider]    Allergies    Nortriptyline hcl and Doxycycline  Review of Systems   Review of Systems  Constitutional: Negative for chills and fever.  HENT: Negative for congestion and rhinorrhea.   Eyes: Negative for redness and visual disturbance.  Respiratory: Negative for shortness of breath and wheezing.   Cardiovascular: Positive for palpitations. Negative for chest pain.  Gastrointestinal: Negative for nausea and vomiting.  Genitourinary: Negative for dysuria and urgency.  Musculoskeletal: Negative for arthralgias and myalgias.  Skin: Negative for pallor and wound.  Neurological: Negative for dizziness and headaches.    Physical Exam Updated Vital Signs BP 101/67   Pulse (!) 117   Temp 98 F (36.7 C) (Oral)   Resp (!) 21   Ht  (1.651 m)   Wt 108.9 kg   SpO2 94%   BMI 39.94 kg/m   Physical Exam Vitals and nursing note reviewed.  Constitutional:      General: She is not in acute distress.    Appearance: She is well-developed. She is not diaphoretic.  HENT:     Head: Normocephalic and atraumatic.  Eyes:     Pupils: Pupils are equal,  round, and reactive to light.  Cardiovascular:     Rate and Rhythm: Tachycardia present. Rhythm irregular.     Heart sounds: No murmur heard. No friction rub. No gallop.   Pulmonary:     Effort: Pulmonary effort is normal.     Breath sounds: No wheezing or rales.  Abdominal:     General: There is no distension.     Palpations: Abdomen is soft.     Tenderness: There is no abdominal tenderness.  Musculoskeletal:        General: No tenderness.     Cervical back: Normal range of motion and neck supple.     Right lower leg: Edema present.  Skin:    General: Skin is warm and dry.  Neurological:     Mental Status: She is alert and oriented to person, place, and time.  Psychiatric:        Behavior: Behavior normal.     ED Results / Procedures / Treatments   Labs (all labs ordered are listed, but only abnormal results are displayed) Labs Reviewed  CBC WITH DIFFERENTIAL/PLATELET - Abnormal; Notable for the following components:      Result Value   Hemoglobin 11.5 (*)    RDW 17.4 (*)    All other components within normal limits  BASIC METABOLIC PANEL - Abnormal; Notable for the following components:   Glucose, Bld 117 (*)    BUN <5 (*)    Calcium 8.7 (*)    All other components within normal limits  RESP PANEL BY RT-PCR (FLU A&B, COVID) [id number]  MAGNESIUM    EKG EKG Interpretation  Date/Time:  Saturday January 11 2021 03:39:19 EDT Ventricular Rate:  149 PR Interval:    QRS Duration: 107 QT Interval:  271 QTC Calculation: 393 R Axis:   60 Text Interpretation: Atrial fibrillation Repolarization abnormality, prob rate related No significant change since last tracing Confirmed by Melene Plan [phone]) on 01/11/2021 3:46:40 AM   Radiology DG Chest Port 1 View  Result Date: 01/11/2021 CLINICAL DATA:  65 year old female with atrial fibrillation, RVR. EXAM: PORTABLE CHEST 1 VIEW COMPARISON:  CTA chest 01/09/2018 and earlier. FINDINGS: Portable AP semi upright view at 0509 hours. Pacer  pads project over  the central chest. Lung volumes and mediastinal contours are stable and within normal limits. Visualized tracheal air column is within normal limits. Pulmonary vascularity appears stable and within normal limits. Allowing for portable technique the lungs are clear. No pneumothorax or pleural effusion. IMPRESSION: Stable.  No acute cardiopulmonary abnormality. Electronically Signed   By: Odessa Fleming M.D.   On: 01/11/2021 05:14    Procedures .Sedation  Date/Time: 01/11/2021 3:44 AM Performed by: Melene Plan, DO Authorized by: Melene Plan, DO   Consent:    Consent obtained:  Written   Risks discussed:  Allergic reaction, dysrhythmia, inadequate sedation, nausea, vomiting and prolonged sedation necessitating reversal   Alternatives discussed:  Analgesia without sedation Universal protocol:    Immediately prior to procedure, a time out was called: yes     Patient identity confirmed:  Arm band Indications:    Procedure performed:  Cardioversion   Procedure necessitating sedation performed by:  Physician performing sedation Pre-sedation assessment:    Time since last food or drink:  6   ASA classification: class 2 - patient with mild systemic disease     Mouth opening:  2 finger widths   Thyromental distance:  3 finger widths   Mallampati score:  II - soft palate, uvula, fauces visible   Neck mobility: normal     Pre-sedation assessments completed and reviewed: airway patency, cardiovascular function, hydration status, mental status, nausea/vomiting, pain level, respiratory function and temperature   Procedure details (see MAR for exact dosages):    Preoxygenation:  Nasal cannula   Sedation:  Propofol   Intended level of sedation: moderate (conscious sedation)   Intra-procedure monitoring:  Blood pressure monitoring, cardiac monitor, continuous capnometry, continuous pulse oximetry, frequent LOC assessments and frequent vital sign checks   Intra-procedure events: none     Total  Provider sedation time (minutes):  35 Post-procedure details:    Attendance: Constant attendance by certified staff until patient recovered     Recovery: Patient returned to pre-procedure baseline     Post-sedation assessments completed and reviewed: airway patency, cardiovascular function, hydration status, mental status, nausea/vomiting, pain level, respiratory function and temperature     Patient is stable for discharge or admission: yes     Procedure completion:  Tolerated well, no immediate complications .Cardioversion  Date/Time: 01/11/2021 3:45 AM Performed by: Melene Plan, DO Authorized by: Melene Plan, DO   Consent:    Consent obtained:  Verbal   Consent given by:  Patient   Risks discussed:  Cutaneous burn, death, induced arrhythmia and pain   Alternatives discussed:  No treatment, rate-control medication, anti-coagulation medication, alternative treatment and observation Pre-procedure details:    Cardioversion basis:  Elective   Rhythm:  Atrial fibrillation   Electrode placement:  Anterior-posterior Patient sedated: Yes. Refer to sedation procedure documentation for details of sedation.  Attempt one:    Cardioversion mode:  Synchronous   Waveform:  Biphasic   Shock (Joules):  200   Shock outcome:  No change in rhythm Attempt two:    Cardioversion mode:  Synchronous   Waveform:  Biphasic   Shock (Joules):  200   Shock outcome:  No change in rhythm Attempt three:    Cardioversion mode:  Synchronous   Waveform:  Biphasic   Shock (Joules):  200   Shock outcome:  No change in rhythm Post-procedure details:    Patient status:  Awake   Patient tolerance of procedure:  Tolerated well, no immediate complications     Medications Ordered in ED Medications  diltiazem (CARDIZEM) 1 mg/mL load via infusion 20 mg (20 mg Intravenous Bolus from Bag 01/11/21 0355)    And  diltiazem (CARDIZEM) 125 mg in dextrose 5% 125 mL (1 mg/mL) infusion (15 mg/hr Intravenous Rate/Dose Change  01/11/21 0434)  sodium chloride 0.9 % bolus 1,000 mL (0 mLs Intravenous Stopped 01/11/21 0433)  propofol (DIPRIVAN) 10 mg/mL bolus/IV push 54.5 mg (40 mg Intravenous Given 01/11/21 0328)  propofol (DIPRIVAN) 10 mg/mL bolus/IV push (20 mg Intravenous Given 01/11/21 0331)  propofol (DIPRIVAN) 10 mg/mL bolus/IV push (20 mg Intravenous Given 01/11/21 0334)  propofol (DIPRIVAN) 10 mg/mL bolus/IV push (20 mg Intravenous Given 01/11/21 0335)    ED Course  I have reviewed the triage vital signs and the nursing notes.  Pertinent labs & imaging results that were available during my care of the patient were reviewed by me and considered in my medical decision making (see chart for details).    MDM Rules/Calculators/A&P                          65 yo F with a chief complaints of sudden onset palpitations this started about 6 hours ago.  Has a history of A. fib and feels the same.  Attempted electrical cardioversion and unsuccessful.  Will bolus Dilt and started on a dilt drip.  CRITICAL CARE Performed by: Rae Roam   Total critical care time: 80 minutes  Critical care time was exclusive of separately billable procedures and treating other patients.  Critical care was necessary to treat or prevent imminent or life-threatening deterioration.  Critical care was time spent personally by me on the following activities: development of treatment plan with patient and/or surrogate as well as nursing, discussions with consultants, evaluation of patient's response to treatment, examination of patient, obtaining history from patient or surrogate, ordering and performing treatments and interventions, ordering and review of laboratory studies, ordering and review of radiographic studies, pulse oximetry and re-evaluation of patient's condition.   The patients results and plan were reviewed and discussed.   Any x-rays performed were independently reviewed by myself.   Differential diagnosis were considered  with the presenting HPI.  Medications  diltiazem (CARDIZEM) 1 mg/mL load via infusion 20 mg (20 mg Intravenous Bolus from Bag 01/11/21 0355)    And  diltiazem (CARDIZEM) 125 mg in dextrose 5% 125 mL (1 mg/mL) infusion (15 mg/hr Intravenous Rate/Dose Change 01/11/21 0434)  sodium chloride 0.9 % bolus 1,000 mL (0 mLs Intravenous Stopped 01/11/21 0433)  propofol (DIPRIVAN) 10 mg/mL bolus/IV push 54.5 mg (40 mg Intravenous Given 01/11/21 0328)  propofol (DIPRIVAN) 10 mg/mL bolus/IV push (20 mg Intravenous Given 01/11/21 0331)  propofol (DIPRIVAN) 10 mg/mL bolus/IV push (20 mg Intravenous Given 01/11/21 0334)  propofol (DIPRIVAN) 10 mg/mL bolus/IV push (20 mg Intravenous Given 01/11/21 0335)    Vitals:   01/11/21 0445 01/11/21 0500 01/11/21 0530 01/11/21 0545  BP: 109/74 105/75 113/65 101/67  Pulse: (!) 119 (!) 118 (!) 105 (!) 117  Resp: (!) 21 (!) 21 20 (!) 21  Temp:      TempSrc:      SpO2: 94% 92% 94% 94%  Weight:      Height:        Final diagnoses:  Atrial fibrillation with rapid ventricular response (HCC)    Admission/ observation were discussed with the admitting physician, patient and/or family and they are comfortable with the plan.   Final Clinical Impression(s) / ED Diagnoses Final diagnoses:  Atrial fibrillation with rapid ventricular response [facility])    Rx / DC Orders ED Discharge Orders    None       Melene Plan, DO 01/11/21 [phone]

## 2021-01-11 NOTE — Progress Notes (Signed)
   01/11/21 0756  Assess: MEWS Score  Temp 98.1 F (36.7 C)  BP 112/60  Pulse Rate (!) 105  ECG Heart Rate (!) 105  Resp (!) 21  Level of Consciousness Alert  SpO2 98 %  O2 Device Room Air  Assess: MEWS Score  MEWS Temp 0  MEWS Systolic 0  MEWS Pulse 1  MEWS RR 1  MEWS LOC 0  MEWS Score 2  MEWS Score Color Yellow  Assess: if the MEWS score is Yellow or Red  Were vital signs taken at a resting state? Yes  Focused Assessment No change from prior assessment  Early Detection of Sepsis Score *See Row Information* Low  MEWS guidelines implemented *See Row Information* Yes  Treat  MEWS Interventions Other (Comment) (pt with diagnosis of Afib RVR)  Pain Scale 0-10  Pain Score 3  Pain Type Chronic pain  Escalate  MEWS: Escalate Yellow: discuss with charge nurse/RN and consider discussing with provider and RRT  Notify: Charge Nurse/RN  Name of Charge Nurse/RN Notified Research scientist (physical sciences)  Date Charge Nurse/RN Notified 01/11/21  Time Charge Nurse/RN Notified 0800  Notify: Provider  Provider Name/Title Jonah Blue MD  Date Provider Notified 01/11/21  Time Provider Notified 0930  Notification Type Face-to-face  Notification Reason Other (Comment) (Elevated HR)  Provider response No new orders  Date of Provider Response 01/11/21  Time of Provider Response 0940  Document  Patient Outcome Other (Comment) (Diltiazem IV in progress)

## 2021-01-11 NOTE — Sedation Documentation (Signed)
Shock delivered by Dr. Adela Lank

## 2021-01-11 NOTE — H&P (Addendum)
History and Physical    Autumn [name] QVZ:[id number] DOB: Jun 15, 1956 DOA: 01/11/2021  PCP: [facility]:  [facility] - cardiology Patient coming from:  Homeless, recent situation; NOK: Autumn [name], [phone]   Chief Complaint: Afib  HPI: Autumn [name] is a 65 y.o. female with medical history significant of polysubstance abuse; fibromyalgia; COPD; HLD; afib; CAD s/p stent; and depression/anxiety/ADHD presenting with afib.  She reports that she came to the hospital because of afib - heart racing, SOB.  Symptoms started last night about 7pm.  Symptoms are persisting.  Has h/o similar.  She became homeless on Thursday because the lady she was living with moved to [place].  She has been staying in the apartment but she is going to be kicked out of it Monday.  She has been taking her medications.  She takes Eliquis, Plavix - denies missing doses.  She has never required cardioversion.  The only recent life change is the stress.  Cardioversion attempted x3 overnight without success.    ED Course:  Carryover, per Dr. Rachael Darby:  65 yo female who went into A-fib with RVR last night at 7:30 pm. ER physician tried to cardiovert x 3 but was not successful. Now on cardizem infusion.   Review of Systems: As per HPI; otherwise review of systems reviewed and negative.   Ambulatory Status:  Ambulates without assistance  COVID Vaccine Status:  Complete  Past Medical History:  Diagnosis Date  . Anxiety   . Arthritis   . Atrial fibrillation (HCC)   . Attention deficit disorder (ADD)   . COPD (chronic obstructive pulmonary disease) (HCC)   . Depression   . Drug abuse (HCC)   . Fibromyalgia   . Hyperlipidemia     Past Surgical History:  Procedure Laterality Date  . ANKLE SURGERY    . FACIAL COSMETIC SURGERY      Social History   Socioeconomic History  . Marital status: Divorced    Spouse name: Not on file  . Number of children: Not on file  . Years of education: Not on file   . Highest education level: Not on file  Occupational History  . Occupation: disabled  Tobacco Use  . Smoking status: Current Every Day Smoker    Packs/day: 0.50    Types: Cigarettes  . Smokeless tobacco: Never Used  . Tobacco comment: requests patch  Vaping Use  . Vaping Use: Every day  Substance and Sexual Activity  . Alcohol use: Not Currently    Comment: A fifth of vodka a day - none in a year  . Drug use: Yes    Types: Cocaine, Marijuana    Comment: uses marijuana regularly; used cocaine " a long time ago"  . Sexual activity: Not on file  Other Topics Concern  . Not on file  Social History Narrative  . Not on file   Social Determinants of Health   Financial Resource Strain: Not on file  Food Insecurity: Not on file  Transportation Needs: Not on file  Physical Activity: Not on file  Stress: Not on file  Social Connections: Not on file  Intimate Partner Violence: Not on file    Allergies  Allergen Reactions  . Nortriptyline Hcl Anaphylaxis  . Doxycycline Other (See Comments)    History reviewed. No pertinent family history.  Prior to Admission medications   Medication Sig Start Date End Date Taking? Authorizing Provider  albuterol (PROVENTIL) (2.5 MG/3ML) 0.083% nebulizer solution Inhale 3 mLs into the lungs every 4 (  four) hours as needed for shortness of breath or wheezing. 08/22/20  Yes [provider]  albuterol (VENTOLIN HFA) 108 (90 Base) MCG/ACT inhaler Inhale 2 puffs into the lungs every 4 (four) hours as needed. 11/25/20  Yes [provider]  amphetamine-dextroamphetamine (ADDERALL) 20 MG tablet Take 20 mg by mouth 3 (three) times daily. 12/25/20  Yes [provider]  apixaban (ELIQUIS) 5 MG TABS tablet Take 5 mg by mouth in the morning and at bedtime. 09/05/20  Yes [provider]  ARIPiprazole (ABILIFY) 10 MG tablet Take 10 mg by mouth daily. 10/18/20  Yes [provider]  atorvastatin (LIPITOR) 40 MG tablet Take 1  tablet by mouth daily. 11/25/20  Yes [provider]  buprenorphine-naloxone (SUBOXONE) 8-2 mg SUBL SL tablet Place 1 tablet daily under the tongue. 06/28/17  Yes Philip Aspen, Limmie Patricia, MD  busPIRone (BUSPAR) 10 MG tablet Take 10 mg by mouth in the morning and at bedtime. 04/11/20  Yes [provider]  cloNIDine (CATAPRES) 0.1 MG tablet Take 0.1 mg by mouth in the morning and at bedtime.   Yes [provider]  clopidogrel (PLAVIX) 75 MG tablet Take 75 mg by mouth daily. 12/18/20 12/18/21 Yes [provider]  cyclobenzaprine (FLEXERIL) 10 MG tablet Take 10 mg by mouth 3 (three) times daily as needed for muscle spasms. 12/27/20  Yes [provider]  diclofenac Sodium (VOLTAREN) 1 % GEL Apply 2-4 g topically 4 (four) times daily as needed (pain). 12/18/20  Yes [provider]  DULoxetine (CYMBALTA) 60 MG capsule Take 60 mg daily by mouth. 04/09/17  Yes [provider]  furosemide (LASIX) 20 MG tablet Take 20 mg by mouth daily. 12/27/20  Yes [provider]  lisinopril (PRINIVIL,ZESTRIL) 10 MG tablet Take 10 mg daily by mouth. 05/28/17  Yes [provider]  metoprolol succinate (TOPROL-XL) 50 MG 24 hr tablet Take 50 mg by mouth daily. 11/25/20  Yes [provider]  Multiple Vitamin (MULTIVITAMIN WITH MINERALS) TABS Take 1 tablet by mouth every morning.    Yes [provider]  naloxone (NARCAN) nasal spray 4 mg/0.1 mL Place 4 mg into the nose as needed (overdose). 10/06/19  Yes [provider]  omeprazole (PRILOSEC) 20 MG capsule Take 20 mg daily by mouth. 10/20/16  Yes [provider]  pravastatin (PRAVACHOL) 40 MG tablet Take 40 mg at bedtime by mouth. 06/04/17  Yes [provider]  pregabalin (LYRICA) 50 MG capsule Take 50 mg by mouth 3 (three) times daily. 12/25/20  Yes [provider]  SPIRIVA RESPIMAT 1.25 MCG/ACT AERS Inhale 2 puffs into the lungs daily. 11/25/20  Yes  [provider]  traZODone (DESYREL) 100 MG tablet Take 100 mg by mouth at bedtime as needed for sleep.   Yes [provider]  amphetamine-dextroamphetamine (ADDERALL XR) 30 MG 24 hr capsule Take 1 capsule (30 mg total) daily before breakfast by mouth. Patient not taking: Reported on 01/11/2021 06/29/17   Philip Aspen, Limmie Patricia, MD  chlordiazePOXIDE (LIBRIUM) 25 MG capsule 50mg  PO TID x 1D, then 25-50mg  PO BID X 1D, then 25-50mg  PO QD X 1D Patient not taking: Reported on 01/11/2021 05/20/17   07/20/17, MD  predniSONE (DELTASONE) 20 MG tablet Take 20 mg by mouth See admin instructions. Bid x 5 days Patient not taking: No sig reported 12/25/20   [provider]    Physical Exam: Vitals:   01/11/21 0756 01/11/21 1000 01/11/21 1214 01/11/21 1400  BP: 112/60  121/61 103/65 93/63  Pulse: (!) 105 93 (!) 120 91  Resp: (!) 21 19 17 20   Temp: 98.1 F (36.7 C) 98.3 F (36.8 C) 98.3 F (36.8 C) 98.3 F (36.8 C)  TempSrc: Oral Oral Oral Oral  SpO2: 98%  98% 95%  Weight:      Height: 5\' 5"  (1.651 m)        . General:  Appears calm and comfortable and is in NAD . Eyes:   EOMI, normal lids, iris . ENT:  grossly normal hearing, lips & tongue, mmm . Neck:  no LAD, masses or thyromegaly . Cardiovascular:  Irregularly irregular with tachycardia, no m/r/g. 1+ LE edema.  Marland Kitchen. Respiratory:   CTA bilaterally with no wheezes/rales/rhonchi.  Normal respiratory effort. . Abdomen:  soft, NT, ND . Skin:  no rash or induration seen on limited exam . Musculoskeletal:  grossly normal tone BUE/BLE, good ROM, no bony abnormality . Psychiatric:  blunted mood and affect, speech fluent and appropriate, AOx3 . Neurologic:  CN 2-12 grossly intact, moves all extremities in coordinated fashion    Radiological Exams on Admission: Independently reviewed - see discussion in A/P where applicable  DG Chest Port 1 View  Result Date: 01/11/2021 CLINICAL DATA:  65 year old female with atrial  fibrillation, RVR. EXAM: PORTABLE CHEST 1 VIEW COMPARISON:  CTA chest 01/09/2018 and earlier. FINDINGS: Portable AP semi upright view at 0509 hours. Pacer pads project over the central chest. Lung volumes and mediastinal contours are stable and within normal limits. Visualized tracheal air column is within normal limits. Pulmonary vascularity appears stable and within normal limits. Allowing for portable technique the lungs are clear. No pneumothorax or pleural effusion. IMPRESSION: Stable.  No acute cardiopulmonary abnormality. Electronically Signed   By: Odessa FlemingH  Hall M.D.   On: 01/11/2021 05:14    EKG: Independently reviewed.   0211 - Afib with rate 129; nonspecific ST changes that are likely rate-related 0339 - Afib with rate 149; nonspecific ST changes unchanged from prior   Labs on Admission: I have personally reviewed the available labs and imaging studies at the time of the admission.  Pertinent labs:   Glucose 117 WBC 8.7 Hgb 11.5 COVID/flu negative   Assessment/Plan Principal Problem:   Atrial fibrillation with RVR (HCC) Active Problems:   Polysubstance abuse (HCC)   Depression   Essential hypertension   Tobacco dependence   Homelessness   Afib with RVR -Patient with known h/o afib presenting with RVR -Etiology is thought to be related to stress, possible medical noncompliance (patient denies).  -Since the afib onset was within 48 hours, cardioversion was performed; 3 attempts were unsuccessful and she remained in RVR and so was admitted.  -Will admit to SDU for Diltiazem drip as per protocol with plan to transition to PO Diltiazem once heart rate is controlled (resting HR 110 or lower); patient needs admission based on high-risk social situation and afib with RVR refractory to cardioversion. -She appears to have converted spontaneously and so Cardizem infusion has been stopped for now. -Continue Eliquis  HTN -Continue Clonidine, Lisinopril, Toprol XL  HLD -Continue  Lipitor  CAD -s/p stent -Continue Plavix, Lasix  Chronic pain/opiate dependence -I have reviewed this patient in the [place] Controlled Substances Reporting System.  She is receiving medications from only one provider and appears to be taking them as prescribed. -She is at particularly high risk of opioid misuse, diversion, or overdose. -Continue Suboxone, Flexeril, Lyrica, Trazodone -Consider weaning medications as an outpatient -Will not give additional prn  controlled medications  Psychiatric illness -ADHD -Hold medications - she will not be performing tasks that require attention and focus as an inpatient -Continue Abilify, Buspar, Cymbalta  COPD with ongoing smoking -Continue Spiriva, Albuterol -Tobacco Dependence: encourage cessation.   -This was discussed with the patient and should be reviewed on an ongoing basis.   -Patch ordered at patient request.  Homelessness -Patient reports new homelessness -She thinks the stress associated with this issue led to RVR -Will request TOC consult     Note: This patient has been tested and is negative for the novel coronavirus COVID-19. She has been fully vaccinated against COVID-19.    DVT prophylaxis: Eliquis Code Status:  Full - confirmed with patient Family Communication: None present Disposition Plan:  The patient is from: home  Anticipated d/c is to: home without [facility] services once her cardiology issues have been resolved.  Anticipated d/c date will depend on clinical response to treatment, likely 2-3 days  Patient is currently: acutely ill Consults called: TOC team Admission status:  Admit - It is my clinical opinion that admission to INPATIENT is reasonable and necessary because of the expectation that this patient will require hospital care that crosses at least 2 midnights to treat this condition based on the medical complexity of the problems presented.  Given the aforementioned information, the predictability of an adverse  outcome is felt to be significant.    Jonah Blue MD [facility]   How to contact the [facility] Attending or Consulting provider 7A - 7P or covering provider during after hours 7P -7A, for this patient?  1. Check the care team in [facility] and look for a) attending/consulting TRH provider listed and b) the [facility] team listed 2. Log into [email or web address] and use [place]'s universal password to access. If you do not have the password, please contact the hospital operator. 3. Locate the [facility] provider you are looking for under [facility] and page to a number that you can be directly reached. 4. If you still have difficulty reaching the provider, please page the [facility] (Director on Call) for the Hospitalists listed on amion for assistance.   01/11/2021, 6:51 PM

## 2021-01-11 NOTE — ED Triage Notes (Addendum)
Pt BIB EMS from home for "feeling like heart started racing," but waited to call for help due not being sure if it was her a fib or anxiety. Dx with a fib and COPD. AxO x4. C/o SOB and chest tightness without pain  Hx of MI in 2017, stent placed  HR 109-186 BP 133/44 Pulse 150 98% RA

## 2021-01-11 NOTE — Progress Notes (Signed)
RT note. RT at bedside during cardioversion

## 2021-01-12 ENCOUNTER — Inpatient Hospital Stay (HOSPITAL_COMMUNITY): Payer: 59

## 2021-01-12 DIAGNOSIS — Z5900 Homelessness unspecified: Secondary | ICD-10-CM

## 2021-01-12 DIAGNOSIS — F1910 Other psychoactive substance abuse, uncomplicated: Secondary | ICD-10-CM

## 2021-01-12 DIAGNOSIS — I4891 Unspecified atrial fibrillation: Secondary | ICD-10-CM

## 2021-01-12 DIAGNOSIS — F32A Depression, unspecified: Secondary | ICD-10-CM

## 2021-01-12 DIAGNOSIS — I10 Essential (primary) hypertension: Secondary | ICD-10-CM

## 2021-01-12 LAB — ECHOCARDIOGRAM COMPLETE
AR max vel: 1.38 cm2
AV Area VTI: 1.3 cm2
AV Area mean vel: 1.45 cm2
AV Mean grad: 12 mmHg
AV Peak grad: 23.2 mmHg
Ao pk vel: 2.41 m/s
Area-P 1/2: 2.69 cm2
Height: 65 in
S' Lateral: 3.2 cm
Weight: 3846.4 oz

## 2021-01-12 NOTE — Progress Notes (Signed)
*  PRELIMINARY RESULTS* Echocardiogram 2D Echocardiogram has been performed.  Stacey Drain 01/12/2021, 4:11 PM

## 2021-01-12 NOTE — Progress Notes (Signed)
PROGRESS NOTE    Autumn [name]  DVV:[id number] DOB: 07-11-1956 DOA: 01/11/2021 PCP: Julieanne Manson, MD    No chief complaint on file.   Brief Narrative:   Autumn [name] is a 65 y.o. female with medical history significant of polysubstance abuse; fibromyalgia; COPD; HLD; afib; CAD s/p stent; and depression/anxiety/ADHD presenting with afib.  She reports that she came to the hospital because of afib - heart racing, SOB.Cardioversion attempted x3 overnight without success. She was started on IV cadizem, and she converted to sinus. IV CARDIZEM discontinued.  Pt seen and examined at bedside.   Assessment & Plan:   Principal Problem:   Atrial fibrillation with RVR (HCC) Active Problems:   Polysubstance abuse (HCC)   Depression   Essential hypertension   Tobacco dependence   Homelessness   Atrial fibrillation with RVR:  - CONVERTED to sinus with IV cardizem, d/c diltiazem gtt.  Continue with metoprolol and eliquis for anti coagulation.  Echocardiogram ordered and pending.    Hypertension:  Well controlled.   Hyperlipidemia:  Continue with lipitor.   polysubstance abuse:  TOC consulted for resources.     Tobacco dependence:  Continue with nicotine patch.    Depression:  Continue with home meds.    H/o of CAD s/p PCI Continue with eliquis and plavix, BB, statin and ACE inhibitor.     Mild anemia of chronic disease:  Hemoglobin stable around 11.    DVT prophylaxis: ELIQUIS.  Code Status: FULL CODE.  Family Communication: none at bedside.  Disposition:   Status is: Inpatient  Remains inpatient appropriate because:Unsafe d/c plan and IV treatments appropriate due to intensity of illness or inability to take PO   Dispo: The patient is from: Home              Anticipated d/c is to: pending.               Patient currently is not medically stable to d/c.   Difficult to place patient No       Consultants:   NOne.    Procedures: none.     Antimicrobials: none.    Subjective: No chest pain or sob.   Objective: Vitals:   01/11/21 2256 01/12/21 0000 01/12/21 0415 01/12/21 0952  BP: 96/62 (!) 98/53 (!) 104/59 105/66  Pulse:  62 63 69  Resp:  16 20   Temp:  97.8 F (36.6 C) 97.6 F (36.4 C)   TempSrc:  Oral Oral   SpO2:  97% 96%   Weight:   109 kg   Height:        Intake/Output Summary (Last 24 hours) at 01/12/2021 1448 Last data filed at 01/12/2021 0100 Gross per 24 hour  Intake 892.73 ml  Output 200 ml  Net 692.73 ml   Filed Weights   01/11/21 0213 01/12/21 0415  Weight: 108.9 kg 109 kg    Examination:  General exam: Appears calm and comfortable  Respiratory system: Clear to auscultation. Respiratory effort normal. Cardiovascular system: S1 & S2 heard, RRR. No JVD, . No pedal edema. Gastrointestinal system: Abdomen is nondistended, soft and nontender.Normal bowel sounds heard. Central nervous system: Alert and oriented. No focal neurological deficits. Extremities: Symmetric 5 x 5 power. Skin: No rashes, lesions or ulcers Psychiatry: Mood & affect appropriate.     Data Reviewed: I have personally reviewed following labs and imaging studies  CBC: Recent Labs  Lab 01/11/21 0234  WBC 8.7  NEUTROABS 5.7  HGB 11.5*  HCT 36.5  MCV 87.3  PLT 326    Basic Metabolic Panel: Recent Labs  Lab 01/11/21 0234  NA 138  K 4.1  CL 101  CO2 28  GLUCOSE 117*  BUN <5*  CREATININE 0.70  CALCIUM 8.7*  MG 2.0    GFR: Estimated Creatinine Clearance: 88.4 mL/min (by C-G formula based on SCr of 0.7 mg/dL).  Liver Function Tests: No results for input(s): AST, ALT, ALKPHOS, BILITOT, PROT, ALBUMIN in the last 168 hours.  CBG: No results for input(s): GLUCAP in the last 168 hours.   Recent Results (from the past 240 hour(s))  Resp Panel by RT-PCR (Flu A&B, Covid) Nasopharyngeal Swab     Status: None   Collection Time: 01/11/21  4:33 AM   Specimen: Nasopharyngeal Swab; Nasopharyngeal(NP) swabs in  vial transport medium  Result Value Ref Range Status   SARS Coronavirus 2 by RT PCR NEGATIVE NEGATIVE Final    Comment: (NOTE) SARS-CoV-2 target nucleic acids are NOT DETECTED.  The SARS-CoV-2 RNA is generally detectable in upper respiratory specimens during the acute phase of infection. The lowest concentration of SARS-CoV-2 viral copies this assay can detect is 138 copies/mL. A negative result does not preclude SARS-Cov-2 infection and should not be used as the sole basis for treatment or other patient management decisions. A negative result may occur with  improper specimen collection/handling, submission of specimen other than nasopharyngeal swab, presence of viral mutation(s) within the areas targeted by this assay, and inadequate number of viral copies(<138 copies/mL). A negative result must be combined with clinical observations, patient history, and epidemiological information. The expected result is Negative.  Fact Sheet for Patients:  BloggerCourse.com  Fact Sheet for Healthcare Providers:  SeriousBroker.it  This test is no t yet approved or cleared by the [place] FDA and  has been authorized for detection and/or diagnosis of SARS-CoV-2 by FDA under an Emergency Use Authorization (EUA). This EUA will remain  in effect (meaning this test can be used) for the duration of the COVID-19 declaration under Section 564(b)(1) of the Act, 21 U.S.C.section 360bbb-3(b)(1), unless the authorization is terminated  or revoked sooner.       Influenza A by PCR NEGATIVE NEGATIVE Final   Influenza B by PCR NEGATIVE NEGATIVE Final    Comment: (NOTE) The Xpert Xpress SARS-CoV-2/FLU/RSV plus assay is intended as an aid in the diagnosis of influenza from Nasopharyngeal swab specimens and should not be used as a sole basis for treatment. Nasal washings and aspirates are unacceptable for Xpert Xpress SARS-CoV-2/FLU/RSV testing.  Fact  Sheet for Patients: BloggerCourse.com  Fact Sheet for Healthcare Providers: SeriousBroker.it  This test is not yet approved or cleared by the [place] FDA and has been authorized for detection and/or diagnosis of SARS-CoV-2 by FDA under an Emergency Use Authorization (EUA). This EUA will remain in effect (meaning this test can be used) for the duration of the COVID-19 declaration under Section 564(b)(1) of the Act, 21 U.S.C. section 360bbb-3(b)(1), unless the authorization is terminated or revoked.  Performed at [facility], [address] [place], [place] [address]          Radiology Studies: DG Chest [place] 1 View  Result Date: 01/11/2021 CLINICAL DATA:  65 year old female with atrial fibrillation, RVR. EXAM: PORTABLE CHEST 1 VIEW COMPARISON:  CTA chest 01/09/2018 and earlier. FINDINGS: Portable AP semi upright view at 0509 hours. Pacer pads project over the central chest. Lung volumes and mediastinal contours are stable and within normal limits. Visualized tracheal air column  is within normal limits. Pulmonary vascularity appears stable and within normal limits. Allowing for portable technique the lungs are clear. No pneumothorax or pleural effusion. IMPRESSION: Stable.  No acute cardiopulmonary abnormality. Electronically Signed   By: Odessa Fleming M.D.   On: 01/11/2021 05:14        Scheduled Meds: . apixaban  5 mg Oral BID  . ARIPiprazole  10 mg Oral Daily  . atorvastatin  40 mg Oral Daily  . buprenorphine-naloxone  1 tablet Sublingual Daily  . busPIRone  10 mg Oral BID  . cloNIDine  0.1 mg Oral BID  . clopidogrel  75 mg Oral Daily  . DULoxetine  60 mg Oral Daily  . furosemide  20 mg Oral Daily  . lisinopril  10 mg Oral Daily  . metoprolol succinate  50 mg Oral Daily  . nicotine  14 mg Transdermal Daily  . pantoprazole  40 mg Oral Daily  . pregabalin  50 mg Oral TID  . umeclidinium bromide  1 puff Inhalation  Daily   Continuous Infusions: . diltiazem (CARDIZEM) infusion Stopped (01/11/21 1435)     LOS: 1 day       Kathlen Mody, MD [facility]   To contact the attending provider between 7A-7P or the covering provider during after hours 7P-7A, please log into the web site [email or web address] and access using universal [place] password for that web site. If you do not have the password, please call the hospital operator.  01/12/2021, 2:48 PM

## 2021-01-13 LAB — CBC WITH DIFFERENTIAL/PLATELET
Abs Immature Granulocytes: 0.03 10*3/uL (ref 0.00–0.07)
Basophils Absolute: 0 10*3/uL (ref 0.0–0.1)
Basophils Relative: 0 %
Eosinophils Absolute: 0.2 10*3/uL (ref 0.0–0.5)
Eosinophils Relative: 2 %
HCT: 31.8 % — ABNORMAL LOW (ref 36.0–46.0)
Hemoglobin: 9.8 g/dL — ABNORMAL LOW (ref 12.0–15.0)
Immature Granulocytes: 0 %
Lymphocytes Relative: 24 %
Lymphs Abs: 2 10*3/uL (ref 0.7–4.0)
MCH: 27.3 pg (ref 26.0–34.0)
MCHC: 30.8 g/dL (ref 30.0–36.0)
MCV: 88.6 fL (ref 80.0–100.0)
Monocytes Absolute: 0.6 10*3/uL (ref 0.1–1.0)
Monocytes Relative: 6 %
Neutro Abs: 5.7 10*3/uL (ref 1.7–7.7)
Neutrophils Relative %: 68 %
Platelets: 267 10*3/uL (ref 150–400)
RBC: 3.59 MIL/uL — ABNORMAL LOW (ref 3.87–5.11)
RDW: 17.3 % — ABNORMAL HIGH (ref 11.5–15.5)
WBC: 8.6 10*3/uL (ref 4.0–10.5)
nRBC: 0 % (ref 0.0–0.2)

## 2021-01-13 LAB — BASIC METABOLIC PANEL
Anion gap: 7 (ref 5–15)
BUN: 16 mg/dL (ref 8–23)
CO2: 26 mmol/L (ref 22–32)
Calcium: 8.1 mg/dL — ABNORMAL LOW (ref 8.9–10.3)
Chloride: 101 mmol/L (ref 98–111)
Creatinine, Ser: 1.01 mg/dL — ABNORMAL HIGH (ref 0.44–1.00)
GFR, Estimated: >60 mL/min (ref >60)
Glucose, Bld: 124 mg/dL — ABNORMAL HIGH (ref 70–99)
Potassium: 4.2 mmol/L (ref 3.5–5.1)
Sodium: 134 mmol/L — ABNORMAL LOW (ref 135–145)

## 2021-01-13 LAB — URINALYSIS, ROUTINE W REFLEX MICROSCOPIC
Bilirubin Urine: NEGATIVE
Glucose, UA: NEGATIVE mg/dL
Hgb urine dipstick: NEGATIVE
Ketones, ur: NEGATIVE mg/dL
Leukocytes,Ua: NEGATIVE
Nitrite: NEGATIVE
Protein, ur: NEGATIVE mg/dL
Specific Gravity, Urine: 1.003 — ABNORMAL LOW (ref 1.005–1.030)
pH: 5 (ref 5.0–8.0)

## 2021-01-13 MED ORDER — SODIUM CHLORIDE 0.9 % IV SOLN: INTRAVENOUS | Status: DC

## 2021-01-13 MED ORDER — SODIUM CHLORIDE 0.9 % IV BOLUS
1000.0000 mL | Freq: Once | INTRAVENOUS | Status: AC
  Administered 2021-01-13: 1000 mL via INTRAVENOUS

## 2021-01-13 MED ORDER — PREDNISONE 20 MG PO TABS
60.0000 mg | ORAL_TABLET | Freq: Every day | ORAL | Status: DC
  Administered 2021-01-13 – 2021-01-14 (×2): 60 mg via ORAL
  Filled 2021-01-13 (×2): qty 3

## 2021-01-13 NOTE — Evaluation (Signed)
Occupational Therapy Evaluation Patient Details Name: Autumn [name] MRN: [id number] DOB: 1956-02-14 Today's Date: 01/13/2021    History of Present Illness Autumn [name] is a 65 y.o. female with medical history significant of polysubstance abuse; fibromyalgia; COPD; HLD; afib; CAD s/p stent; and depression/anxiety/ADHD presenting with afib.  She reports that she came to the hospital because of afib - heart racing, SOB.   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 Energy conservation education provided, but did not give out handout. Pt would benefit from continued OT skilled services for ADL and safety. OT following acutely.    Follow Up Recommendations  No OT follow up    Equipment Recommendations  None recommended by OT    Recommendations for Other Services       Precautions / Restrictions Precautions Precautions: Fall Restrictions Weight Bearing Restrictions: No      Mobility Bed Mobility Overal bed mobility: Modified Independent             General bed mobility comments: assist for lines    Transfers Overall transfer level: Needs assistance Equipment used: None Transfers: Sit to/from Stand Sit to Stand: Supervision         General transfer comment: did not require assist    Balance Overall balance assessment: No apparent balance deficits (not formally assessed)                                         ADL either performed or assessed with clinical judgement   ADL Overall ADL's : Modified independent                                       General ADL Comments: Pt performing own ADL in bathroom and at sink; pt making her bed  in standing/fixing her linens before OT said to get up- slightly impulsive, but no LOB episodes. Energy conservation education provided, but did not give out handout.     Vision Baseline Vision/History: No visual deficits Patient Visual Report: No change from baseline Vision Assessment?: No apparent visual deficits     Perception     Praxis      Pertinent Vitals/Pain Pain Assessment: Faces Faces Pain Scale: Hurts a little bit Pain Location: R knee with mobility Pain Descriptors / Indicators: Discomfort;Sore Pain Intervention(s): Other (comment) (applied R knee brace)     Hand Dominance Right   Extremity/Trunk Assessment Upper Extremity Assessment Upper Extremity Assessment: Overall WFL for tasks assessed   Lower Extremity Assessment Lower Extremity Assessment: Defer to PT evaluation;RLE deficits/detail RLE Deficits / Details: soreness; wearing knee brace   Cervical / Trunk Assessment Cervical / Trunk Assessment: Normal   Communication Communication Communication: No difficulties   Cognition Arousal/Alertness: Awake/alert Behavior During Therapy: Impulsive;Anxious Overall Cognitive Status: Within Functional Limits for tasks assessed  General Comments: Pt generally aware of safety, but requires cues to slow down d/t lines. Pt appears anxious about situation and unable to come up with a solution at the moment.   General Comments  O2 on RA desat to mid 80s requiring 10 secs of pursed lip breathing to recover, HR <110 BPM with exertion; BP stable.    Exercises     Shoulder Instructions      Home Living Family/patient expects to be discharged to:: Unsure                                 Additional Comments: Patient was living with roomate, but roomate moved to [place] and she now has no where to live. Pt is on the waiting list for supported housing.      Prior Functioning/Environment Level of Independence:  Independent                 OT Problem List: Decreased activity tolerance;Decreased safety awareness;Pain      OT Treatment/Interventions: Self-care/ADL training;Energy conservation;Therapeutic activities;Patient/family education;Balance training;DME and/or AE instruction    OT Goals(Current goals can be found in the care plan section) Acute Rehab OT Goals Patient Stated Goal: to have decreased pain in right knee OT Goal Formulation: With patient Time For Goal Achievement: 01/27/21 Potential to Achieve Goals: Good ADL Goals Additional ADL Goal #1: Pt will state 3 energy conservation techniques to increase independence with ADL and mobility. Additional ADL Goal #2: Pt will perform OOB ADL x5 mins in standing with 1 seated rest break and O2 >90% to increase activity tolerance.  OT Frequency: Min 2X/week   Barriers to D/C: Decreased caregiver support          Co-evaluation              AM-PAC OT "6 Clicks" Daily Activity     Outcome Measure Help from another person eating meals?: None Help from another person taking care of personal grooming?: None Help from another person toileting, which includes using toliet, bedpan, or urinal?: None Help from another person bathing (including washing, rinsing, drying)?: A Little Help from another person to put on and taking off regular upper body clothing?: None Help from another person to put on and taking off regular lower body clothing?: None 6 Click Score: 23   End of Session Equipment Utilized During Treatment: Other (comment) (knee brace) Nurse Communication: Mobility status  Activity Tolerance: Patient tolerated treatment well Patient left: in bed;with call bell/phone within reach;with bed alarm set  OT Visit Diagnosis: Unsteadiness on feet (R26.81)                Time: 5170-0174 OT Time Calculation (min): 20 min Charges:  OT General Charges $OT Visit: 1 Visit OT Evaluation $OT Eval Moderate Complexity: 1  Mod  Flora Lipps, OTR/L Acute Rehabilitation Services Pager: [phone] Office: [phone]   Gunnar Fusi Galan Ghee 01/13/2021, 8:16 PM

## 2021-01-13 NOTE — Evaluation (Signed)
Physical Therapy Evaluation Patient Details Name: Autumn [name] MRN: [id number] DOB: 1956-02-16 Today's Date: 01/13/2021   History of Present Illness  Autumn [name] is a 65 y.o. female with medical history significant of polysubstance abuse; fibromyalgia; COPD; HLD; afib; CAD s/p stent; and depression/anxiety/ADHD presenting with afib.  She reports that she came to the hospital because of afib - heart racing, SOB.    Clinical Impression  Patient received in bed, agreeable to PT session. She is impulsive with mobility requiring supervision for safety and min assist for line management.  Patient requires min guard for sit to stand and for ambulation of 60 feet. Ambulates with antalgic pattern due to reported R knee pain. Patient ambulated on room air and sats dropped into the mid 80%s. Patient left in bed on nasal canula. RN notified.  She will continue to benefit from skilled PT while here to improve activity tolerance, safety and strength.       Follow Up Recommendations Home health PT    Equipment Recommendations  Rolling walker with 5" wheels    Recommendations for Other Services       Precautions / Restrictions Precautions Precautions: Fall Restrictions Weight Bearing Restrictions: No RLE Weight Bearing: Weight bearing as tolerated      Mobility  Bed Mobility Overal bed mobility: Needs Assistance Bed Mobility: Supine to Sit;Sit to Supine     Supine to sit: Supervision Sit to supine: Supervision   General bed mobility comments: impulsive, benefits from supervision for lines/ safety    Transfers Overall transfer level: Needs assistance Equipment used: None Transfers: Sit to/from Stand Sit to Stand: Min guard            Ambulation/Gait Ambulation/Gait assistance: Min guard Gait Distance (Feet): 60 Feet Assistive device: IV Pole Gait Pattern/deviations: Step-through pattern;Decreased step length - right;Decreased step length - left;Decreased stride  length;Antalgic Gait velocity: dec   General Gait Details: patient ambulates with antalgic gait pattern due to pain in right knee she reports began about a week ago.  Stairs            Wheelchair Mobility    Modified Rankin (Stroke Patients Only)       Balance Overall balance assessment: Needs assistance Sitting-balance support: Feet supported Sitting balance-Leahy Scale: Good     Standing balance support: Single extremity supported;During functional activity Standing balance-Leahy Scale: Fair Standing balance comment: patient is impulsive with mobility and has right knee pain. Benefits from assistance and would benefit from ad to assist with knee pain.                             Pertinent Vitals/Pain Pain Assessment: 0-10 Pain Score: 8  Pain Location: R knee with mobility Pain Descriptors / Indicators: Discomfort;Sore Pain Intervention(s): Limited activity within patient's tolerance;Monitored during session;Repositioned    Home Living Family/patient expects to be discharged to:: Unsure                 Additional Comments: Patient was living with roomate, but roomate moved to [place] and she now has no where to live.    Prior Function Level of Independence: Independent               Hand Dominance        Extremity/Trunk Assessment   Upper Extremity Assessment Upper Extremity Assessment: Defer to OT evaluation    Lower Extremity Assessment Lower Extremity Assessment: RLE deficits/detail RLE Coordination: decreased gross motor  Cervical / Trunk Assessment Cervical / Trunk Assessment: Normal  Communication   Communication: No difficulties  Cognition Arousal/Alertness: Awake/alert Behavior During Therapy: Impulsive Overall Cognitive Status: Within Functional Limits for tasks assessed                                        General Comments      Exercises     Assessment/Plan    PT Assessment Patient needs  continued PT services  PT Problem List Decreased strength;Decreased mobility;Decreased safety awareness;Decreased activity tolerance;Decreased balance;Pain;Decreased knowledge of use of DME;Cardiopulmonary status limiting activity;Obesity       PT Treatment Interventions DME instruction;Gait training;Therapeutic exercise;Balance training;Stair training;Functional mobility training;Therapeutic activities;Patient/family education    PT Goals (Current goals can be found in the Care Plan section)  Acute Rehab PT Goals Patient Stated Goal: to have decreased pain in right knee PT Goal Formulation: With patient Time For Goal Achievement: 01/27/21 Potential to Achieve Goals: Fair    Frequency Min 2X/week   Barriers to discharge Decreased caregiver support No home to return to    Co-evaluation               AM-PAC PT "6 Clicks" Mobility  Outcome Measure Help needed turning from your back to your side while in a flat bed without using bedrails?: A Little Help needed moving from lying on your back to sitting on the side of a flat bed without using bedrails?: A Little Help needed moving to and from a bed to a chair (including a wheelchair)?: A Little Help needed standing up from a chair using your arms (e.g., wheelchair or bedside chair)?: A Little Help needed to walk in hospital room?: A Little Help needed climbing 3-5 steps with a railing? : A Lot 6 Click Score: 17    End of Session Equipment Utilized During Treatment: Gait belt;Oxygen Activity Tolerance: Patient limited by pain;Patient limited by fatigue;Other (comment) (sob) Patient left: in bed;with bed alarm set;with call bell/phone within reach Nurse Communication: Mobility status PT Visit Diagnosis: Unsteadiness on feet (R26.81);Other abnormalities of gait and mobility (R26.89);Muscle weakness (generalized) (M62.81);Pain Pain - Right/Left: Right Pain - part of body: Knee    Time: 0950-1003 PT Time Calculation (min) (ACUTE  ONLY): 13 min   Charges:   PT Evaluation $PT Eval Moderate Complexity: 1 Mod          Whitley Patchen, PT, GCS 01/13/21,10:19 AM

## 2021-01-13 NOTE — Progress Notes (Signed)
PROGRESS NOTE    KARALYNN COTTONE  YOV:[id number] DOB: 1955-09-18 DOA: 01/11/2021 PCP: Julieanne Manson, MD    No chief complaint on file.   Brief Narrative:   Autumn [name] is a 65 y.o. female with medical history significant of polysubstance abuse; fibromyalgia; COPD; HLD; afib; CAD s/p stent; and depression/anxiety/ADHD presenting with afib.  She reports that she came to the hospital because of afib - heart racing, SOB.Cardioversion attempted x3 overnight without success. She was started on IV cadizem, and she converted to sinus. IV cardizem discontinued. She is back on metoprolol for rate control.  Pt seen and examined this am, reports feeling weak. She was also hypotensive this morning and all her BP meds were held.    Assessment & Plan:   Principal Problem:   Atrial fibrillation with RVR (HCC) Active Problems:   Polysubstance abuse (HCC)   Depression   Essential hypertension   Tobacco dependence   Homelessness   Atrial fibrillation with RVR:  - CONVERTED to sinus with IV cardizem, d/c diltiazem gtt.  Continue with metoprolol and eliquis for anti coagulation.  Echocardiogram yesterday showed LVef of 60 to 65%, with no regional wall motion abn, with mild left ventricular hypertrophy and normal diastolic parameters.    Mild copd exacerbation; Continue with prednisone and bronchodilators as needed.   Hypotension: BP parameters are borderline low. Holding all meds including lasix, clonidine.  CXR is negative for pneumonia. [place] is negative for infection. Will check lactic acid and am cortisol level.   Hyperlipidemia:  Continue with lipitor.   polysubstance abuse:  TOC consulted for resources.     Tobacco dependence:  Continue with nicotine patch.    Depression:  Continue with home meds.    H/o of CAD s/p PCI Continue with eliquis and plavix, BB, statin . Ace inhibitor on hold for borderline low BP     Mild anemia of chronic disease:  Hemoglobin stable  around 11.    DVT prophylaxis: ELIQUIS.  Code Status: FULL CODE.  Family Communication: none at bedside.  Disposition:   Status is: Inpatient  Remains inpatient appropriate because:Unsafe d/c plan and IV treatments appropriate due to intensity of illness or inability to take PO   Dispo: The patient is from: Home              Anticipated d/c is to: pending.               Patient currently is not medically stable to d/c.   Difficult to place patient No       Consultants:   NOne.    Procedures: none.    Antimicrobials: none.    Subjective: Reports feeling weak, no chest pain or sob.   Objective: Vitals:   01/13/21 0721 01/13/21 1007 01/13/21 1125 01/13/21 1228  BP: (!) 89/46  (!) 94/48 (!) 94/48  Pulse: 63   71  Resp: 17   16  Temp: 98.2 F (36.8 C)   98.1 F (36.7 C)  TempSrc: Oral   Oral  SpO2: 100% 100%  96%  Weight:      Height:        Intake/Output Summary (Last 24 hours) at 01/13/2021 1448 Last data filed at 01/13/2021 1232 Gross per 24 hour  Intake --  Output 1500 ml  Net -1500 ml   Filed Weights   01/11/21 0213 01/12/21 0415 01/13/21 0442  Weight: 108.9 kg 109 kg 110.2 kg    Examination:  General exam: well developed lady, no  t in any distress on RA.  Respiratory system: Scattered wheezing heard posteriorly, no tachypnea.  Cardiovascular system: S1 & S2 heard, RRR no JVD,  Gastrointestinal system: Abdomen is soft, NT ND BS+ Central nervous system: ALERT and oriented, nonfocal.  Extremities: no cyanosis.  Skin: No rashes seen.  Psychiatry: Mood is appropriate.     Data Reviewed: I have personally reviewed following labs and imaging studies  CBC: Recent Labs  Lab 01/11/21 0234 01/13/21 1042  WBC 8.7 8.6  NEUTROABS 5.7 5.7  HGB 11.5* 9.8*  HCT 36.5 31.8*  MCV 87.3 88.6  PLT 326 267    Basic Metabolic Panel: Recent Labs  Lab 01/11/21 0234 01/13/21 1042  NA 138 134*  K 4.1 4.2  CL 101 101  CO2 28 26  GLUCOSE 117* 124*   BUN <5* 16  CREATININE 0.70 1.01*  CALCIUM 8.7* 8.1*  MG 2.0  --     GFR: Estimated Creatinine Clearance: 70.5 mL/min (A) (by C-G formula based on SCr of 1.01 mg/dL (H)).  Liver Function Tests: No results for input(s): AST, ALT, ALKPHOS, BILITOT, PROT, ALBUMIN in the last 168 hours.  CBG: No results for input(s): GLUCAP in the last 168 hours.   Recent Results (from the past 240 hour(s))  Resp Panel by RT-PCR (Flu A&B, Covid) Nasopharyngeal Swab     Status: None   Collection Time: 01/11/21  4:33 AM   Specimen: Nasopharyngeal Swab; Nasopharyngeal(NP) swabs in vial transport medium  Result Value Ref Range Status   SARS Coronavirus 2 by RT PCR NEGATIVE NEGATIVE Final    Comment: (NOTE) SARS-CoV-2 target nucleic acids are NOT DETECTED.  The SARS-CoV-2 RNA is generally detectable in upper respiratory specimens during the acute phase of infection. The lowest concentration of SARS-CoV-2 viral copies this assay can detect is 138 copies/mL. A negative result does not preclude SARS-Cov-2 infection and should not be used as the sole basis for treatment or other patient management decisions. A negative result may occur with  improper specimen collection/handling, submission of specimen other than nasopharyngeal swab, presence of viral mutation(s) within the areas targeted by this assay, and inadequate number of viral copies(<138 copies/mL). A negative result must be combined with clinical observations, patient history, and epidemiological information. The expected result is Negative.  Fact Sheet for Patients:  BloggerCourse.com  Fact Sheet for Healthcare Providers:  SeriousBroker.it  This test is no t yet approved or cleared by the [place] FDA and  has been authorized for detection and/or diagnosis of SARS-CoV-2 by FDA under an Emergency Use Authorization (EUA). This EUA will remain  in effect (meaning this test can be used)  for the duration of the COVID-19 declaration under Section 564(b)(1) of the Act, 21 U.S.C.section 360bbb-3(b)(1), unless the authorization is terminated  or revoked sooner.       Influenza A by PCR NEGATIVE NEGATIVE Final   Influenza B by PCR NEGATIVE NEGATIVE Final    Comment: (NOTE) The Xpert Xpress SARS-CoV-2/FLU/RSV plus assay is intended as an aid in the diagnosis of influenza from Nasopharyngeal swab specimens and should not be used as a sole basis for treatment. Nasal washings and aspirates are unacceptable for Xpert Xpress SARS-CoV-2/FLU/RSV testing.  Fact Sheet for Patients: BloggerCourse.com  Fact Sheet for Healthcare Providers: SeriousBroker.it  This test is not yet approved or cleared by the [place] FDA and has been authorized for detection and/or diagnosis of SARS-CoV-2 by FDA under an Emergency Use Authorization (EUA). This EUA will remain in effect (meaning this  test can be used) for the duration of the COVID-19 declaration under Section 564(b)(1) of the Act, 21 U.S.C. section 360bbb-3(b)(1), unless the authorization is terminated or revoked.  Performed at [facility], [address] [place], [place] [address]          Radiology Studies: ECHOCARDIOGRAM COMPLETE  Result Date: 01/12/2021    ECHOCARDIOGRAM REPORT   Patient Name:   Autumn [name] Date of Exam: 01/12/2021 Medical Rec #:  [id number]       Height:       65.0 in Accession #:    [id number]      Weight:       240.4 lb Date of Birth:  1956-05-24       BSA:          2.139 m Patient Age:    64 years        BP:           105/66 mmHg Patient Gender: F               HR:           69 bpm. Exam Location:  Inpatient Procedure: 2D Echo, Cardiac Doppler and Color Doppler Indications:    Atrial Fibrillation I48.91  History:        Patient has no prior history of Echocardiogram examinations.                 COPD, Arrythmias:Atrial Fibrillation; Risk  Factors:Hypertension                 and Dyslipidemia. Polysubstance abuse (HCC), Alcohol abuse.  Sonographer:    Celesta Gentile RCS Referring Phys: Herminio Heads IMPRESSIONS  1. Left ventricular ejection fraction, by estimation, is 60 to 65%. The left ventricle has normal function. The left ventricle has no regional wall motion abnormalities. There is mild left ventricular hypertrophy. Left ventricular diastolic parameters were normal.  2. Right ventricular systolic function is normal. The right ventricular size is normal. There is normal pulmonary artery systolic pressure. The estimated right ventricular systolic pressure is 29.6 mmHg.  3. Left atrial size was mildly dilated.  4. Right atrial size was mildly dilated.  5. The mitral valve is normal in structure. Trivial mitral valve regurgitation. No evidence of mitral stenosis.  6. The aortic valve was not well visualized. Aortic valve regurgitation is not visualized. Mild aortic valve stenosis. Vmax 2.4 m/s, MG 12 mmHg, AVA 1.5 cm^2, DI 0.45  7. The inferior vena cava is normal in size with greater than 50% respiratory variability, suggesting right atrial pressure of 3 mmHg. FINDINGS  Left Ventricle: Left ventricular ejection fraction, by estimation, is 60 to 65%. The left ventricle has normal function. The left ventricle has no regional wall motion abnormalities. The left ventricular internal cavity size was normal in size. There is  mild left ventricular hypertrophy. Left ventricular diastolic parameters were normal. Right Ventricle: The right ventricular size is normal. No increase in right ventricular wall thickness. Right ventricular systolic function is normal. There is normal pulmonary artery systolic pressure. The tricuspid regurgitant velocity is 2.58 m/s, and  with an assumed right atrial pressure of 3 mmHg, the estimated right ventricular systolic pressure is 29.6 mmHg. Left Atrium: Left atrial size was mildly dilated. Right Atrium: Right atrial  size was mildly dilated. Pericardium: There is no evidence of pericardial effusion. Mitral Valve: The mitral valve is normal in structure. Trivial mitral valve regurgitation. No evidence of mitral valve stenosis. Tricuspid  Valve: The tricuspid valve is normal in structure. Tricuspid valve regurgitation is trivial. Aortic Valve: The aortic valve was not well visualized. Aortic valve regurgitation is not visualized. Mild aortic stenosis is present. Aortic valve mean gradient measures 12.0 mmHg. Aortic valve peak gradient measures 23.2 mmHg. Aortic valve area, by VTI  measures 1.30 cm. Pulmonic Valve: The pulmonic valve was not well visualized. Pulmonic valve regurgitation is not visualized. Aorta: The aortic root is normal in size and structure. Venous: The inferior vena cava is normal in size with greater than 50% respiratory variability, suggesting right atrial pressure of 3 mmHg. IAS/Shunts: The interatrial septum was not well visualized.  LEFT VENTRICLE PLAX 2D LVIDd:         5.10 cm  Diastology LVIDs:         3.20 cm  LV e' medial:    8.49 cm/s LV PW:         1.00 cm  LV E/e' medial:  13.5 LV IVS:        1.00 cm  LV e' lateral:   10.90 cm/s LVOT diam:     2.00 cm  LV E/e' lateral: 10.6 LV SV:         69 LV SV Index:   32 LVOT Area:     3.14 cm  RIGHT VENTRICLE RV S prime:     10.40 cm/s TAPSE (M-mode): 2.0 cm LEFT ATRIUM             Index       RIGHT ATRIUM           Index LA diam:        4.50 cm 2.10 cm/m  RA Area:     21.70 cm LA Vol (A2C):   94.6 ml 44.23 ml/m RA Volume:   67.30 ml  31.46 ml/m LA Vol (A4C):   64.1 ml 29.97 ml/m LA Biplane Vol: 86.2 ml 40.30 ml/m  AORTIC VALVE AV Area (Vmax):    1.38 cm AV Area (Vmean):   1.45 cm AV Area (VTI):     1.30 cm AV Vmax:           241.00 cm/s AV Vmean:          160.000 cm/s AV VTI:            0.529 m AV Peak Grad:      23.2 mmHg AV Mean Grad:      12.0 mmHg LVOT Vmax:         105.95 cm/s LVOT Vmean:        73.900 cm/s LVOT VTI:          0.219 m LVOT/AV VTI  ratio: 0.41  AORTA Ao Root diam: 3.30 cm MITRAL VALVE                TRICUSPID VALVE MV Area (PHT): 2.69 cm     TR Peak grad:   26.6 mmHg MV Decel Time: 282 msec     TR Vmax:        258.00 cm/s MV E velocity: 115.00 cm/s MV A velocity: 71.60 cm/s   SHUNTS MV E/A ratio:  1.61         Systemic VTI:  0.22 m                             Systemic Diam: 2.00 cm Epifanio Lescheshristopher Schumann MD Electronically signed by Epifanio Lescheshristopher Schumann MD Signature Date/Time: 01/12/2021/4:27:44 PM  Final         Scheduled Meds: . apixaban  5 mg Oral BID  . ARIPiprazole  10 mg Oral Daily  . atorvastatin  40 mg Oral Daily  . buprenorphine-naloxone  1 tablet Sublingual Daily  . busPIRone  10 mg Oral BID  . cloNIDine  0.1 mg Oral BID  . clopidogrel  75 mg Oral Daily  . DULoxetine  60 mg Oral Daily  . furosemide  20 mg Oral Daily  . lisinopril  10 mg Oral Daily  . metoprolol succinate  50 mg Oral Daily  . nicotine  14 mg Transdermal Daily  . pantoprazole  40 mg Oral Daily  . predniSONE  60 mg Oral Q breakfast  . pregabalin  50 mg Oral TID  . umeclidinium bromide  1 puff Inhalation Daily   Continuous Infusions:    LOS: 2 days       Kathlen Mody, MD [facility]   To contact the attending provider between 7A-7P or the covering provider during after hours 7P-7A, please log into the web site [email or web address] and access using universal [place] password for that web site. If you do not have the password, please call the hospital operator.  01/13/2021, 2:48 PM

## 2021-01-13 NOTE — Social Work (Addendum)
CSW received consult for patient. CSW met with patient at bedside. Patient says she comes from home with friend [place]. Patient reports she has lived with [place] the past six months. Patient reports [place] moved on Friday and patient states she is now homeless. Patient has children. Two are incarcerated and other child is alcoholic. 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 Patient reports she does get social security and disability. Patient receives monthly income..CSW offered patient housing [place] resources, and outpatient substance use treatment services resources. Patient accepted. Patient is going to work on calling area shelters as well as looking into motel/hotel.Patient will need assistance with transportation at dc. CSW will continue to follow and assist with discharge planning needs.

## 2021-01-14 MED ORDER — SENNOSIDES-DOCUSATE SODIUM 8.6-50 MG PO TABS
1.0000 | ORAL_TABLET | Freq: Two times a day (BID) | ORAL | Status: DC
  Administered 2021-01-14 – 2021-01-15 (×3): 1 via ORAL
  Filled 2021-01-14 (×3): qty 1

## 2021-01-14 MED ORDER — PREDNISONE 20 MG PO TABS
40.0000 mg | ORAL_TABLET | Freq: Every day | ORAL | Status: DC
  Administered 2021-01-15: 40 mg via ORAL
  Filled 2021-01-14: qty 2

## 2021-01-14 MED ORDER — MAGNESIUM CITRATE PO SOLN
1.0000 | Freq: Once | ORAL | Status: AC
  Administered 2021-01-14: 1 via ORAL
  Filled 2021-01-14: qty 296

## 2021-01-14 NOTE — Social Work (Signed)
CSW spoke with patient at bedside. Patient let CSW know she is had no luck with finding an area shelter. CSW asked patient if she has called to check rates for motel/hotel. Patient said she has not. Patient asked CSW to assist. CSW helped patient with a couple different hotel/motel rates to review. CSW let patient know if she thinks of another hotel/motel to call and add to list to choose from. CSW will follow up with patient in the am for transportation to dc destination.

## 2021-01-14 NOTE — Plan of Care (Signed)

## 2021-01-14 NOTE — Progress Notes (Signed)
PROGRESS NOTE    Autumn [name]  EZM:[id number] DOB: 1955/09/16 DOA: 01/11/2021 PCP: Julieanne Manson, MD    Brief Narrative:  Autumn [name] is a 65 year old female with past medical history significant for fibromyalgia, polysubstance abuse, COPD, hyperlipidemia, proximal atrial fibrillation, CAD, depression, anxiety, ADHD who presented to [facility], ED on 6/4 with a chief complaint of palpitations and shortness of breath.  Patient reports compliance with her home medications.  Recently became homeless due to the lady she was living with moved to [facility]  In the ED, patient was noted to be in A. fib with RVR.  ED physician attempted cardioversion x3 but was unsuccessful.  Patient was started on a Cardizem drip.  Hospitalist service consulted for further evaluation and management.   Assessment & Plan:   Principal Problem:   Atrial fibrillation with RVR (HCC) Active Problems:   Polysubstance abuse (HCC)   Depression   Essential hypertension   Tobacco dependence   Homelessness   Atrial fibrillation with RVR Patient was noted to be in A. fib with RVR on presentation to the ED.  Failed cardioversion attempt by ED physician x3.  Patient initially started on IV Cardizem drip which was converted to oral metoprolol.  TTE with LVEF 60-65%, no regional wall motion abnormalities with mild LVH and normal diastolic parameters. -- Metoprolol succinate 50 mg p.o. daily -- Eliquis for anticoagulation -- Continue to monitor on telemetry  COPD exacerbation --Taper prednisone to 40 mg p.o. daily tomorrow --incruse Ellipta 1 puff daily --albuterol neb q4h prn shortness of breath or wheezing  Essential hypertension Home medications include clonidine 0.1 mg p.o. twice daily, furosemide 20 mg p.o. daily, lisinopril 10 mg p.o. daily, metoprolol succinate 50 mg p.o. daily.  Patient was noted to be hypotensive on admission. --BP 127/74 --Continue metoprolol succinate 50 mg p.o. daily --Hold  remainder home antihypertensives --Continue monitor BP closely  Hyperlipidemia: atorvastatin 40mg  PO daily  CAD s/p stent --Atorvastatin 40 mg p.o. daily --Plavix 75 mg p.o. daily  Chronic pain syndrome Opioid dependence -- Continue home Suboxone 8-2 mg sublingually daily -- Flexeril 10 mg p.o. 3 times daily as needed -- Lyrica 50 mg p.o. 3 times daily  ADHD: Holding home medications while inpatient  Depression/anxiety --Continue Abilify, BuSpar, Cymbalta  Continued tobacco use disorder Counseled on need for complete cessation. --Nicotine patch  Constipation --Magnesium citrate x1 today --Senokot-S 1 tablet p.o. twice daily  GERD: Continue PPI  Homelessness Patient reports later she was living with has now moved. --TOC evaluated and gave patient shelter/housing information  Morbid obesity Body mass index is 42.78 kg/m.  Discussed with patient needs for aggressive lifestyle changes/weight loss as this complicates all facets of care.  Outpatient follow-up with PCP.  May benefit from bariatric evaluation outpatient.    DVT prophylaxis:  apixaban (ELIQUIS) tablet 5 mg     Code Status: Full Code Family Communication: No family present at bedside this morning  Disposition Plan:  Level of care: Telemetry Cardiac Status is: Inpatient  Remains inpatient appropriate because:Ongoing diagnostic testing needed not appropriate for outpatient work up, Unsafe d/c plan, IV treatments appropriate due to intensity of illness or inability to take PO and Inpatient level of care appropriate due to severity of illness   Dispo: The patient is from: Home              Anticipated d/c is to: Homeless Shelter              Patient currently is  not medically stable to d/c.   Difficult to place patient No   Consultants:   none  Procedures:   Cardioversion x3 by ED provider 6/4  Antimicrobials:   None   Subjective: Patient seen and examined bedside, resting comfortably.   Heart rate remains well controlled.  No other specific questions or concerns at this time.  Denies headache, no visual changes, no chest pain, no palpitations, no shortness of breath, no abdominal pain, no weakness, no fatigue, no paresthesias.  No acute events overnight per nursing staff.  Objective: Vitals:   01/14/21 0551 01/14/21 0826 01/14/21 0838 01/14/21 1152  BP: 127/74 115/70  117/71  Pulse: 89 92  83  Resp: 17 18    Temp: 98.1 F (36.7 C) 98 F (36.7 C)  98.6 F (37 C)  TempSrc: Oral Oral  Oral  SpO2: 94% 100% 99% 98%  Weight: 116.6 kg     Height:        Intake/Output Summary (Last 24 hours) at 01/14/2021 1643 Last data filed at 01/14/2021 0043 Gross per 24 hour  Intake 1027.16 ml  Output --  Net 1027.16 ml   Filed Weights   01/12/21 0415 01/13/21 0442 01/14/21 0551  Weight: 109 kg 110.2 kg 116.6 kg    Examination:  General exam: Appears calm and comfortable, obese  Respiratory system: Clear to auscultation. Respiratory effort normal. On room air. Cardiovascular system: S1 & S2 heard, RRR. No JVD, murmurs, rubs, gallops or clicks. No pedal edema. Gastrointestinal system: Abdomen is nondistended, soft and nontender. No organomegaly or masses felt. Normal bowel sounds heard. Central nervous system: Alert and oriented. No focal neurological deficits. Extremities: Symmetric 5 x 5 power. Skin: No rashes, lesions or ulcers Psychiatry: Judgement and insight appear normal. Mood & affect appropriate.     Data Reviewed: I have personally reviewed following labs and imaging studies  CBC: Recent Labs  Lab 01/11/21 0234 01/13/21 1042  WBC 8.7 8.6  NEUTROABS 5.7 5.7  HGB 11.5* 9.8*  HCT 36.5 31.8*  MCV 87.3 88.6  PLT 326 267   Basic Metabolic Panel: Recent Labs  Lab 01/11/21 0234 01/13/21 1042  NA 138 134*  K 4.1 4.2  CL 101 101  CO2 28 26  GLUCOSE 117* 124*  BUN <5* 16  CREATININE 0.70 1.01*  CALCIUM 8.7* 8.1*  MG 2.0  --    GFR: Estimated Creatinine  Clearance: 72.7 mL/min (A) (by C-G formula based on SCr of 1.01 mg/dL (H)). Liver Function Tests: No results for input(s): AST, ALT, ALKPHOS, BILITOT, PROT, ALBUMIN in the last 168 hours. No results for input(s): LIPASE, AMYLASE in the last 168 hours. No results for input(s): AMMONIA in the last 168 hours. Coagulation Profile: No results for input(s): INR, PROTIME in the last 168 hours. Cardiac Enzymes: No results for input(s): CKTOTAL, CKMB, CKMBINDEX, TROPONINI in the last 168 hours. BNP (last 3 results) No results for input(s): PROBNP in the last 8760 hours. HbA1C: No results for input(s): HGBA1C in the last 72 hours. CBG: No results for input(s): GLUCAP in the last 168 hours. Lipid Profile: No results for input(s): CHOL, HDL, LDLCALC, TRIG, CHOLHDL, LDLDIRECT in the last 72 hours. Thyroid Function Tests: No results for input(s): TSH, T4TOTAL, FREET4, T3FREE, THYROIDAB in the last 72 hours. Anemia Panel: No results for input(s): VITAMINB12, FOLATE, FERRITIN, TIBC, IRON, RETICCTPCT in the last 72 hours. Sepsis Labs: No results for input(s): PROCALCITON, LATICACIDVEN in the last 168 hours.  Recent Results (from the past 240 hour(s))  Resp Panel by RT-PCR (Flu A&B, Covid) Nasopharyngeal Swab     Status: None   Collection Time: 01/11/21  4:33 AM   Specimen: Nasopharyngeal Swab; Nasopharyngeal(NP) swabs in vial transport medium  Result Value Ref Range Status   SARS Coronavirus 2 by RT PCR NEGATIVE NEGATIVE Final    Comment: (NOTE) SARS-CoV-2 target nucleic acids are NOT DETECTED.  The SARS-CoV-2 RNA is generally detectable in upper respiratory specimens during the acute phase of infection. The lowest concentration of SARS-CoV-2 viral copies this assay can detect is 138 copies/mL. A negative result does not preclude SARS-Cov-2 infection and should not be used as the sole basis for treatment or other patient management decisions. A negative result may occur with  improper specimen  collection/handling, submission of specimen other than nasopharyngeal swab, presence of viral mutation(s) within the areas targeted by this assay, and inadequate number of viral copies(<138 copies/mL). A negative result must be combined with clinical observations, patient history, and epidemiological information. The expected result is Negative.  Fact Sheet for Patients:  BloggerCourse.comhttps://www.fda.gov/media/152166/download  Fact Sheet for Healthcare Providers:  SeriousBroker.ithttps://www.fda.gov/media/152162/download  This test is no t yet approved or cleared by the [place] FDA and  has been authorized for detection and/or diagnosis of SARS-CoV-2 by FDA under an Emergency Use Authorization (EUA). This EUA will remain  in effect (meaning this test can be used) for the duration of the COVID-19 declaration under Section 564(b)(1) of the Act, 21 U.S.C.section 360bbb-3(b)(1), unless the authorization is terminated  or revoked sooner.       Influenza A by PCR NEGATIVE NEGATIVE Final   Influenza B by PCR NEGATIVE NEGATIVE Final    Comment: (NOTE) The Xpert Xpress SARS-CoV-2/FLU/RSV plus assay is intended as an aid in the diagnosis of influenza from Nasopharyngeal swab specimens and should not be used as a sole basis for treatment. Nasal washings and aspirates are unacceptable for Xpert Xpress SARS-CoV-2/FLU/RSV testing.  Fact Sheet for Patients: BloggerCourse.comhttps://www.fda.gov/media/152166/download  Fact Sheet for Healthcare Providers: SeriousBroker.ithttps://www.fda.gov/media/152162/download  This test is not yet approved or cleared by the [place] FDA and has been authorized for detection and/or diagnosis of SARS-CoV-2 by FDA under an Emergency Use Authorization (EUA). This EUA will remain in effect (meaning this test can be used) for the duration of the COVID-19 declaration under Section 564(b)(1) of the Act, 21 U.S.C. section 360bbb-3(b)(1), unless the authorization is terminated or revoked.  Performed at [facility], [address] [place], [place] [address]          Radiology Studies: No results found.      Scheduled Meds: . apixaban  5 mg Oral BID  . ARIPiprazole  10 mg Oral Daily  . atorvastatin  40 mg Oral Daily  . buprenorphine-naloxone  1 tablet Sublingual Daily  . busPIRone  10 mg Oral BID  . clopidogrel  75 mg Oral Daily  . DULoxetine  60 mg Oral Daily  . metoprolol succinate  50 mg Oral Daily  . nicotine  14 mg Transdermal Daily  . pantoprazole  40 mg Oral Daily  . predniSONE  60 mg Oral Q breakfast  . pregabalin  50 mg Oral TID  . senna-docusate  1 tablet Oral BID  . umeclidinium bromide  1 puff Inhalation Daily   Continuous Infusions:   LOS: 3 days    Time spent: 38 minutes spent on chart review, discussion with nursing staff, consultants, updating family and interview/physical exam; more than 50% of that time was spent in counseling  and/or coordination of care.    Alvira Philips [place], DO [facility] Available via Epic secure chat 7am-7pm After these hours, please refer to coverage provider listed on [email or web address] 01/14/2021, 4:43 PM

## 2021-01-14 NOTE — Plan of Care (Signed)
  Problem: Education: Goal: Knowledge of General Education information will improve Description: Including pain rating scale, medication(s)/side effects and non-pharmacologic comfort measures Outcome: Progressing   Problem: Health Behavior/Discharge Planning: Goal: Ability to manage health-related needs will improve Outcome: Progressing   Problem: Clinical Measurements: Goal: Ability to maintain clinical measurements within normal limits will improve Outcome: Progressing Goal: Will remain free from infection Outcome: Progressing Goal: Diagnostic test results will improve Outcome: Progressing Goal: Respiratory complications will improve Outcome: Progressing Goal: Cardiovascular complication will be avoided Outcome: Progressing   Problem: Activity: Goal: Risk for activity intolerance will decrease Outcome: Progressing   Problem: Nutrition: Goal: Adequate nutrition will be maintained Outcome: Progressing   Problem: Coping: Goal: Level of anxiety will decrease Outcome: Progressing   Problem: Elimination: Goal: Will not experience complications related to bowel motility Outcome: Progressing Goal: Will not experience complications related to urinary retention Outcome: Progressing   Problem: Pain Managment: Goal: General experience of comfort will improve Outcome: Progressing   Problem: Safety: Goal: Ability to remain free from injury will improve Outcome: Progressing   Problem: Skin Integrity: Goal: Risk for impaired skin integrity will decrease Outcome: Progressing   Problem: Education: Goal: Knowledge of disease or condition will improve Outcome: Progressing Goal: Understanding of medication regimen will improve Outcome: Progressing Goal: Individualized Educational Video(s) Outcome: Progressing   Problem: Activity: Goal: Ability to tolerate increased activity will improve Outcome: Progressing   Problem: Cardiac: Goal: Ability to achieve and maintain  adequate cardiopulmonary perfusion will improve Outcome: Progressing   

## 2021-01-15 ENCOUNTER — Other Ambulatory Visit (HOSPITAL_COMMUNITY): Payer: Self-pay

## 2021-01-15 MED ORDER — ELIQUIS 5 MG PO TABS
5.0000 mg | ORAL_TABLET | Freq: Two times a day (BID) | ORAL | 2 refills | Status: DC
  Filled 2021-01-15: qty 60, 30d supply, fill #0

## 2021-01-15 MED ORDER — TRAZODONE HCL 100 MG PO TABS
100.0000 mg | ORAL_TABLET | Freq: Every evening | ORAL | 0 refills | Status: DC | PRN
  Filled 2021-01-15: qty 30, 30d supply, fill #0

## 2021-01-15 MED ORDER — DULOXETINE HCL 60 MG PO CPEP
60.0000 mg | ORAL_CAPSULE | Freq: Every day | ORAL | 2 refills | Status: DC
  Filled 2021-01-15: qty 30, 30d supply, fill #0

## 2021-01-15 MED ORDER — PREGABALIN 50 MG PO CAPS
50.0000 mg | ORAL_CAPSULE | Freq: Three times a day (TID) | ORAL | 2 refills | Status: DC
  Filled 2021-01-15: qty 90, 30d supply, fill #0

## 2021-01-15 MED ORDER — METOPROLOL SUCCINATE ER 100 MG PO TB24
100.0000 mg | ORAL_TABLET | Freq: Every day | ORAL | 2 refills | Status: DC
  Filled 2021-01-15: qty 30, 30d supply, fill #0

## 2021-01-15 MED ORDER — SPIRIVA RESPIMAT 1.25 MCG/ACT IN AERS
2.0000 | INHALATION_SPRAY | Freq: Every day | RESPIRATORY_TRACT | 2 refills | Status: DC
  Filled 2021-01-15: qty 4, 30d supply, fill #0

## 2021-01-15 MED ORDER — BUSPIRONE HCL 10 MG PO TABS
10.0000 mg | ORAL_TABLET | Freq: Two times a day (BID) | ORAL | 2 refills | Status: AC
  Filled 2021-01-15: qty 60, 30d supply, fill #0

## 2021-01-15 MED ORDER — PANTOPRAZOLE SODIUM 40 MG PO TBEC
40.0000 mg | DELAYED_RELEASE_TABLET | Freq: Every day | ORAL | 1 refills | Status: DC
  Filled 2021-01-15: qty 90, 90d supply, fill #0

## 2021-01-15 MED ORDER — ALBUTEROL SULFATE HFA 108 (90 BASE) MCG/ACT IN AERS
2.0000 | INHALATION_SPRAY | RESPIRATORY_TRACT | 2 refills | Status: DC | PRN
  Filled 2021-01-15: qty 8.5, 20d supply, fill #0

## 2021-01-15 MED ORDER — ARIPIPRAZOLE 10 MG PO TABS
10.0000 mg | ORAL_TABLET | Freq: Every day | ORAL | 2 refills | Status: DC
Start: 2021-01-15 — End: 2023-02-22
  Filled 2021-01-15: qty 30, 30d supply, fill #0

## 2021-01-15 MED ORDER — OMEPRAZOLE 20 MG PO CPDR
20.0000 mg | DELAYED_RELEASE_CAPSULE | Freq: Every day | ORAL | 2 refills | Status: DC
  Filled 2021-01-15: qty 30, 30d supply, fill #0

## 2021-01-15 MED ORDER — CLOPIDOGREL BISULFATE 75 MG PO TABS
75.0000 mg | ORAL_TABLET | Freq: Every day | ORAL | 2 refills | Status: AC
  Filled 2021-01-15: qty 30, 30d supply, fill #0

## 2021-01-15 MED ORDER — PREDNISONE 10 MG PO TABS
ORAL_TABLET | ORAL | 0 refills | Status: AC
  Filled 2021-01-15: qty 4, 3d supply, fill #0

## 2021-01-15 MED ORDER — NICOTINE 14 MG/24HR TD PT24
14.0000 mg | MEDICATED_PATCH | TRANSDERMAL | 0 refills | Status: AC
  Filled 2021-01-15: qty 10, 10d supply, fill #0

## 2021-01-15 MED ORDER — NICOTINE 7 MG/24HR TD PT24
7.0000 mg | MEDICATED_PATCH | TRANSDERMAL | 0 refills | Status: AC
  Filled 2021-01-15: qty 21, 21d supply, fill #0

## 2021-01-15 MED ORDER — ATORVASTATIN CALCIUM 40 MG PO TABS
1.0000 | ORAL_TABLET | Freq: Every day | ORAL | 2 refills | Status: DC
  Filled 2021-01-15: qty 30, 30d supply, fill #0

## 2021-01-15 NOTE — Discharge Summary (Signed)
Physician Discharge Summary  Autumn [name] ZOX:[id number] DOB: 24-Dec-1955 DOA: 01/11/2021  PCP: Autumn Hook, MD  Admit date: 01/11/2021 Discharge date: 01/15/2021  Admitted From: Homeless  Disposition:  Shelter/Hotel  Recommendations for Outpatient Follow-up:  1. Follow up with PCP in 1-2 weeks 2. Discontinue lisinopril, clonidine and furosemide due to hypotension 3. Increase metoprolol succinate 200 mg p.o. daily for better rate control of A. Fib 4. Continue to encourage tobacco cessation 5. Referral to outpatient A. fib clinic  Home Health: No Equipment/Devices: None  Discharge Condition: Stable CODE STATUS: Full code Diet recommendation: Heart healthy diet  History of present illness:  Autumn [name] is a 65 year old female with past medical history significant for fibromyalgia, polysubstance abuse, COPD, hyperlipidemia, proximal atrial fibrillation, CAD, depression, anxiety, ADHD who presented to [facility] ED on 6/4 with a chief complaint of palpitations and shortness of breath.  Patient reports compliance with her home medications.  Recently became homeless due to the lady she was living with moved to [facility].  In the ED, patient was noted to be in A. fib with RVR.  ED physician attempted cardioversion x3 but was unsuccessful.  Patient was started on a Cardizem drip.  Hospitalist service consulted for further evaluation and management.  Hospital course:  Paroxysmal atrial fibrillation with RVR Patient was noted to be in A. fib with RVR on presentation to the ED.  Failed cardioversion attempt by ED physician x3.  Patient initially started on IV Cardizem drip which was converted to oral metoprolol.  TTE with LVEF 60-65%, no regional wall motion abnormalities with mild LVH and normal diastolic parameters.  Patient will discharge on metoprolol succinate 100 mg p.o. daily.  Continue Eliquis for anticoagulation.  Outpatient follow-up with PCP.  Ambulatory referral to atrial  fibrillation clinic.  COPD exacerbation Continue home Spiriva and albuterol MDI as needed.  Prednisone taper on discharge.  Continue to encourage tobacco cessation.  Essential hypertension Home medications include clonidine 0.1 mg p.o. twice daily, furosemide 20 mg p.o. daily, lisinopril 10 mg p.o. daily, metoprolol succinate 50 mg p.o. daily.  Patient was noted to be hypotensive on admission.  Patient's home clonidine, furosemide and lisinopril were discontinued.  Metoprolol succinate was increased to 100 mg p.o. daily as above.  Outpatient follow-up with PCP for further guidance and monitoring of blood pressure.  Hyperlipidemia: atorvastatin 40mg  PO daily  CAD s/p stent Continue atorvastatin 40 mg p.o. daily and Plavix 75 mg p.o. daily  Chronic pain syndrome Opioid dependence Continue home Suboxone 8-2 mg sublingually daily, Flexeril 10 mg p.o. 3 times daily as needed, Lyrica 50 mg p.o. 3 times daily  ADHD: Holding home medications while inpatient  Depression/anxiety Continue Abilify, BuSpar, Cymbalta  Continued tobacco use disorder Counseled on need for complete cessation.  Patient given tapered nicotine patch on discharge  GERD: Continue PPI  Homelessness Patient reports later she was living with has now moved.  Patient was seen by social work and given information regarding shelters and hotels.  Morbid obesity Body mass index is 42.78 kg/m.  Discussed with patient needs for aggressive lifestyle changes/weight loss as this complicates all facets of care.  Outpatient follow-up with PCP.  May benefit from bariatric evaluation outpatient.  Discharge Diagnoses:  Principal Problem:   Atrial fibrillation with RVR ([place]) Active Problems:   Polysubstance abuse ([place])   Depression   Essential hypertension   Tobacco dependence   Homelessness    Discharge Instructions  Discharge Instructions    Amb referral to AFIB  Clinic   Complete by: As directed    Call MD for:   difficulty breathing, headache or visual disturbances   Complete by: As directed    Call MD for:  extreme fatigue   Complete by: As directed    Call MD for:  persistant dizziness or light-headedness   Complete by: As directed    Call MD for:  persistant nausea and vomiting   Complete by: As directed    Call MD for:  severe uncontrolled pain   Complete by: As directed    Call MD for:  temperature >100.4   Complete by: As directed    Diet - low sodium heart healthy   Complete by: As directed    Increase activity slowly   Complete by: As directed      Allergies as of 01/15/2021      Reactions   Nortriptyline Hcl Anaphylaxis   Doxycycline Other (See Comments)      Medication List    STOP taking these medications   cloNIDine 0.1 MG tablet Commonly known as: CATAPRES   furosemide 20 MG tablet Commonly known as: LASIX   lisinopril 10 MG tablet Commonly known as: ZESTRIL   omeprazole 20 MG capsule Commonly known as: PRILOSEC     TAKE these medications   albuterol (2.5 MG/3ML) 0.083% nebulizer solution Commonly known as: PROVENTIL Inhale 3 mLs into the lungs every 4 (four) hours as needed for shortness of breath or wheezing.   albuterol 108 (90 Base) MCG/ACT inhaler Commonly known as: VENTOLIN HFA Inhale 2 puffs into the lungs every 4 (four) hours as needed.   amphetamine-dextroamphetamine 20 MG tablet Commonly known as: ADDERALL Take 20 mg by mouth 3 (three) times daily.   ARIPiprazole 10 MG tablet Commonly known as: ABILIFY Take 1 tablet (10 mg total) by mouth daily.   atorvastatin 40 MG tablet Commonly known as: LIPITOR Take 1 tablet (40 mg total) by mouth daily.   buprenorphine-naloxone 8-2 mg Subl SL tablet Commonly known as: SUBOXONE Place 1 tablet daily under the tongue.   busPIRone 10 MG tablet Commonly known as: BUSPAR Take 1 tablet (10 mg total) by mouth in the morning and at bedtime.   clopidogrel 75 MG tablet Commonly known as: PLAVIX Take 1  tablet (75 mg total) by mouth daily.   cyclobenzaprine 10 MG tablet Commonly known as: FLEXERIL Take 10 mg by mouth 3 (three) times daily as needed for muscle spasms.   diclofenac Sodium 1 % Gel Commonly known as: VOLTAREN Apply 2-4 g topically 4 (four) times daily as needed (pain).   DULoxetine 60 MG capsule Commonly known as: CYMBALTA Take 1 capsule (60 mg total) by mouth daily.   Eliquis 5 MG Tabs tablet Generic drug: apixaban Take 1 tablet (5 mg total) by mouth in the morning and at bedtime.   metoprolol succinate 100 MG 24 hr tablet Commonly known as: TOPROL-XL Take 1 tablet (100 mg total) by mouth daily. Take with or immediately following a meal. What changed:   medication strength  how much to take  additional instructions   multivitamin with minerals Tabs tablet Take 1 tablet by mouth every morning.   naloxone 4 MG/0.1ML Liqd nasal spray kit Commonly known as: NARCAN Place 4 mg into the nose as needed (overdose).   nicotine 14 mg/24hr patch Commonly known as: NICODERM CQ - dosed in mg/24 hours Place 1 patch (14 mg total) onto the skin daily for 10 days.   nicotine 7 mg/24hr patch Commonly  known as: NICODERM CQ - dosed in mg/24 hr Place 1 patch (7 mg total) onto the skin daily for 21 days. Start taking on: January 25, 2021   pantoprazole 40 MG tablet Commonly known as: Protonix Take 1 tablet (40 mg total) by mouth daily.   predniSONE 10 MG tablet Commonly known as: DELTASONE Take 2 tablets (20 mg total) by mouth daily for 1 day, THEN 1 tablet (10 mg total) daily for 2 days. Start taking on: January 16, 2021   pregabalin 50 MG capsule Commonly known as: LYRICA Take 1 capsule (50 mg total) by mouth 3 (three) times daily.   Spiriva Respimat 1.25 MCG/ACT Aers Generic drug: Tiotropium Bromide Monohydrate Inhale 2 puffs into the lungs daily.   traZODone 100 MG tablet Commonly known as: DESYREL Take 1 tablet (100 mg total) by mouth at bedtime as needed for  sleep.       Follow-up Information    Autumn Hook, MD. Schedule an appointment as soon as possible for a visit in 1 week(s).   Specialty: Internal Medicine Contact information: [address] [place] [address] [phone]              Allergies  Allergen Reactions  . Nortriptyline Hcl Anaphylaxis  . Doxycycline Other (See Comments)    Consultations:  None   Procedures/Studies: DG Chest Port 1 View  Result Date: 01/11/2021 CLINICAL DATA:  65 year old female with atrial fibrillation, RVR. EXAM: PORTABLE CHEST 1 VIEW COMPARISON:  CTA chest 01/09/2018 and earlier. FINDINGS: Portable AP semi upright view at 0509 hours. Pacer pads project over the central chest. Lung volumes and mediastinal contours are stable and within normal limits. Visualized tracheal air column is within normal limits. Pulmonary vascularity appears stable and within normal limits. Allowing for portable technique the lungs are clear. No pneumothorax or pleural effusion. IMPRESSION: Stable.  No acute cardiopulmonary abnormality. Electronically Signed   By: Genevie Ann M.D.   On: 01/11/2021 05:14   ECHOCARDIOGRAM COMPLETE  Result Date: 01/12/2021    ECHOCARDIOGRAM REPORT   Patient Name:   JALAYSHA SKILTON Pettinato Date of Exam: 01/12/2021 Medical Rec #:  [id number]       Height:       65.0 in Accession #:    [id number]      Weight:       240.4 lb Date of Birth:  February 14, 1956       BSA:          2.139 m Patient Age:    34 years        BP:           105/66 mmHg Patient Gender: F               HR:           69 bpm. Exam Location:  Inpatient Procedure: 2D Echo, Cardiac Doppler and Color Doppler Indications:    Atrial Fibrillation I48.91  History:        Patient has no prior history of Echocardiogram examinations.                 COPD, Arrythmias:Atrial Fibrillation; Risk Factors:Hypertension                 and Dyslipidemia. Polysubstance abuse ([place]), Alcohol abuse.  Sonographer:    Alvino Chapel RCS Referring Phys: Theola Sequin IMPRESSIONS  1. Left ventricular ejection fraction, by estimation, is 60 to 65%. The left ventricle has normal function. The left ventricle has  no regional wall motion abnormalities. There is mild left ventricular hypertrophy. Left ventricular diastolic parameters were normal.  2. Right ventricular systolic function is normal. The right ventricular size is normal. There is normal pulmonary artery systolic pressure. The estimated right ventricular systolic pressure is 91.5 mmHg.  3. Left atrial size was mildly dilated.  4. Right atrial size was mildly dilated.  5. The mitral valve is normal in structure. Trivial mitral valve regurgitation. No evidence of mitral stenosis.  6. The aortic valve was not well visualized. Aortic valve regurgitation is not visualized. Mild aortic valve stenosis. Vmax 2.4 m/s, MG 12 mmHg, AVA 1.5 cm^2, DI 0.45  7. The inferior vena cava is normal in size with greater than 50% respiratory variability, suggesting right atrial pressure of 3 mmHg. FINDINGS  Left Ventricle: Left ventricular ejection fraction, by estimation, is 60 to 65%. The left ventricle has normal function. The left ventricle has no regional wall motion abnormalities. The left ventricular internal cavity size was normal in size. There is  mild left ventricular hypertrophy. Left ventricular diastolic parameters were normal. Right Ventricle: The right ventricular size is normal. No increase in right ventricular wall thickness. Right ventricular systolic function is normal. There is normal pulmonary artery systolic pressure. The tricuspid regurgitant velocity is 2.58 m/s, and  with an assumed right atrial pressure of 3 mmHg, the estimated right ventricular systolic pressure is 05.6 mmHg. Left Atrium: Left atrial size was mildly dilated. Right Atrium: Right atrial size was mildly dilated. Pericardium: There is no evidence of pericardial effusion. Mitral Valve: The mitral valve is normal in structure. Trivial mitral valve  regurgitation. No evidence of mitral valve stenosis. Tricuspid Valve: The tricuspid valve is normal in structure. Tricuspid valve regurgitation is trivial. Aortic Valve: The aortic valve was not well visualized. Aortic valve regurgitation is not visualized. Mild aortic stenosis is present. Aortic valve mean gradient measures 12.0 mmHg. Aortic valve peak gradient measures 23.2 mmHg. Aortic valve area, by VTI  measures 1.30 cm. Pulmonic Valve: The pulmonic valve was not well visualized. Pulmonic valve regurgitation is not visualized. Aorta: The aortic root is normal in size and structure. Venous: The inferior vena cava is normal in size with greater than 50% respiratory variability, suggesting right atrial pressure of 3 mmHg. IAS/Shunts: The interatrial septum was not well visualized.  LEFT VENTRICLE PLAX 2D LVIDd:         5.10 cm  Diastology LVIDs:         3.20 cm  LV e' medial:    8.49 cm/s LV PW:         1.00 cm  LV E/e' medial:  13.5 LV IVS:        1.00 cm  LV e' lateral:   10.90 cm/s LVOT diam:     2.00 cm  LV E/e' lateral: 10.6 LV SV:         69 LV SV Index:   32 LVOT Area:     3.14 cm  RIGHT VENTRICLE RV S prime:     10.40 cm/s TAPSE (M-mode): 2.0 cm LEFT ATRIUM             Index       RIGHT ATRIUM           Index LA diam:        4.50 cm 2.10 cm/m  RA Area:     21.70 cm LA Vol (A2C):   94.6 ml 44.23 ml/m RA Volume:   67.30 ml  31.46 ml/m  LA Vol (A4C):   64.1 ml 29.97 ml/m LA Biplane Vol: 86.2 ml 40.30 ml/m  AORTIC VALVE AV Area (Vmax):    1.38 cm AV Area (Vmean):   1.45 cm AV Area (VTI):     1.30 cm AV Vmax:           241.00 cm/s AV Vmean:          160.000 cm/s AV VTI:            0.529 m AV Peak Grad:      23.2 mmHg AV Mean Grad:      12.0 mmHg LVOT Vmax:         105.95 cm/s LVOT Vmean:        73.900 cm/s LVOT VTI:          0.219 m LVOT/AV VTI ratio: 0.41  AORTA Ao Root diam: 3.30 cm MITRAL VALVE                TRICUSPID VALVE MV Area (PHT): 2.69 cm     TR Peak grad:   26.6 mmHg MV Decel Time: 282  msec     TR Vmax:        258.00 cm/s MV E velocity: 115.00 cm/s MV A velocity: 71.60 cm/s   SHUNTS MV E/A ratio:  1.61         Systemic VTI:  0.22 m                             Systemic Diam: 2.00 cm Oswaldo Milian MD Electronically signed by Oswaldo Milian MD Signature Date/Time: 01/12/2021/4:27:44 PM    Final      Subjective: Patient seen examined bedside, resting comfortably.  Patient ready for discharge.  States has been not really calling any hotels or shelters.  Discussed with social work this morning, they will help assist with disposition.  No other questions or concerns at this time.  Denies headache, no visual changes, no chest pain, no palpitations, no shortness of breath, no abdominal pain, no weakness, no fatigue, no paresthesias.  No acute events overnight per nursing staff.  Discharge Exam: Vitals:   01/15/21 0538 01/15/21 0822  BP: (!) 150/79 131/86  Pulse: 78 79  Resp: 17 16  Temp: 98.2 F (36.8 C) 98.1 F (36.7 C)  SpO2: 96% 99%   Vitals:   01/14/21 1714 01/14/21 2020 01/15/21 0538 01/15/21 0822  BP: 117/65 126/68 (!) 150/79 131/86  Pulse: 79 82 78 79  Resp: $Remo'18 18 17 16  'ExyUI$ Temp: 97.9 F (36.6 C) 98 F (36.7 C) 98.2 F (36.8 C) 98.1 F (36.7 C)  TempSrc: Oral Oral Oral Oral  SpO2:  98% 96% 99%  Weight:   110.5 kg   Height:        General: Pt is alert, awake, not in acute distress Cardiovascular: RRR, S1/S2 +, no rubs, no gallops Respiratory: CTA bilaterally, no wheezing, no rhonchi, on room air Abdominal: Soft, NT, ND, bowel sounds + Extremities: no edema, no cyanosis    The results of significant diagnostics from this hospitalization (including imaging, microbiology, ancillary and laboratory) are listed below for reference.     Microbiology: Recent Results (from the past 240 hour(s))  Resp Panel by RT-PCR (Flu A&B, Covid) Nasopharyngeal Swab     Status: None   Collection Time: 01/11/21  4:33 AM   Specimen: Nasopharyngeal Swab;  Nasopharyngeal(NP) swabs in vial transport medium  Result Value Ref Range Status  SARS Coronavirus 2 by RT PCR NEGATIVE NEGATIVE Final    Comment: (NOTE) SARS-CoV-2 target nucleic acids are NOT DETECTED.  The SARS-CoV-2 RNA is generally detectable in upper respiratory specimens during the acute phase of infection. The lowest concentration of SARS-CoV-2 viral copies this assay can detect is 138 copies/mL. A negative result does not preclude SARS-Cov-2 infection and should not be used as the sole basis for treatment or other patient management decisions. A negative result may occur with  improper specimen collection/handling, submission of specimen other than nasopharyngeal swab, presence of viral mutation(s) within the areas targeted by this assay, and inadequate number of viral copies(<138 copies/mL). A negative result must be combined with clinical observations, patient history, and epidemiological information. The expected result is Negative.  Fact Sheet for Patients:  EntrepreneurPulse.com.au  Fact Sheet for Healthcare Providers:  IncredibleEmployment.be  This test is no t yet approved or cleared by the [place] FDA and  has been authorized for detection and/or diagnosis of SARS-CoV-2 by FDA under an Emergency Use Authorization (EUA). This EUA will remain  in effect (meaning this test can be used) for the duration of the COVID-19 declaration under Section 564(b)(1) of the Act, 21 U.S.C.section 360bbb-3(b)(1), unless the authorization is terminated  or revoked sooner.       Influenza A by PCR NEGATIVE NEGATIVE Final   Influenza B by PCR NEGATIVE NEGATIVE Final    Comment: (NOTE) The Xpert Xpress SARS-CoV-2/FLU/RSV plus assay is intended as an aid in the diagnosis of influenza from Nasopharyngeal swab specimens and should not be used as a sole basis for treatment. Nasal washings and aspirates are unacceptable for Xpert Xpress  SARS-CoV-2/FLU/RSV testing.  Fact Sheet for Patients: EntrepreneurPulse.com.au  Fact Sheet for Healthcare Providers: IncredibleEmployment.be  This test is not yet approved or cleared by the [place] FDA and has been authorized for detection and/or diagnosis of SARS-CoV-2 by FDA under an Emergency Use Authorization (EUA). This EUA will remain in effect (meaning this test can be used) for the duration of the COVID-19 declaration under Section 564(b)(1) of the Act, 21 U.S.C. section 360bbb-3(b)(1), unless the authorization is terminated or revoked.  Performed at [facility], [place] [address], [place], [place] [address]      Labs: BNP (last 3 results) No results for input(s): BNP in the last 8760 hours. Basic Metabolic Panel: Recent Labs  Lab 01/11/21 0234 01/13/21 1042  NA 138 134*  K 4.1 4.2  CL 101 101  CO2 28 26  GLUCOSE 117* 124*  BUN <5* 16  CREATININE 0.70 1.01*  CALCIUM 8.7* 8.1*  MG 2.0  --    Liver Function Tests: No results for input(s): AST, ALT, ALKPHOS, BILITOT, PROT, ALBUMIN in the last 168 hours. No results for input(s): LIPASE, AMYLASE in the last 168 hours. No results for input(s): AMMONIA in the last 168 hours. CBC: Recent Labs  Lab 01/11/21 0234 01/13/21 1042  WBC 8.7 8.6  NEUTROABS 5.7 5.7  HGB 11.5* 9.8*  HCT 36.5 31.8*  MCV 87.3 88.6  PLT 326 267   Cardiac Enzymes: No results for input(s): CKTOTAL, CKMB, CKMBINDEX, TROPONINI in the last 168 hours. BNP: Invalid input(s): POCBNP CBG: No results for input(s): GLUCAP in the last 168 hours. D-Dimer No results for input(s): DDIMER in the last 72 hours. Hgb A1c No results for input(s): HGBA1C in the last 72 hours. Lipid Profile No results for input(s): CHOL, HDL, LDLCALC, TRIG, CHOLHDL, LDLDIRECT in the last 72 hours. Thyroid function studies  No results for input(s): TSH, T4TOTAL, T3FREE, THYROIDAB in the last 72 hours.  Invalid input(s):  FREET3 Anemia work up No results for input(s): VITAMINB12, FOLATE, FERRITIN, TIBC, IRON, RETICCTPCT in the last 72 hours. Urinalysis    Component Value Date/Time   COLORURINE STRAW (A) 01/13/2021 1234   APPEARANCEUR CLEAR 01/13/2021 1234   LABSPEC 1.003 (L) 01/13/2021 1234   PHURINE 5.0 01/13/2021 1234   GLUCOSEU NEGATIVE 01/13/2021 1234   HGBUR NEGATIVE 01/13/2021 1234   BILIRUBINUR NEGATIVE 01/13/2021 1234   KETONESUR NEGATIVE 01/13/2021 1234   PROTEINUR NEGATIVE 01/13/2021 1234   UROBILINOGEN 0.2 12/06/2012 1020   NITRITE NEGATIVE 01/13/2021 1234   LEUKOCYTESUR NEGATIVE 01/13/2021 1234   Sepsis Labs Invalid input(s): PROCALCITONIN,  WBC,  LACTICIDVEN Microbiology Recent Results (from the past 240 hour(s))  Resp Panel by RT-PCR (Flu A&B, Covid) Nasopharyngeal Swab     Status: None   Collection Time: 01/11/21  4:33 AM   Specimen: Nasopharyngeal Swab; Nasopharyngeal(NP) swabs in vial transport medium  Result Value Ref Range Status   SARS Coronavirus 2 by RT PCR NEGATIVE NEGATIVE Final    Comment: (NOTE) SARS-CoV-2 target nucleic acids are NOT DETECTED.  The SARS-CoV-2 RNA is generally detectable in upper respiratory specimens during the acute phase of infection. The lowest concentration of SARS-CoV-2 viral copies this assay can detect is 138 copies/mL. A negative result does not preclude SARS-Cov-2 infection and should not be used as the sole basis for treatment or other patient management decisions. A negative result may occur with  improper specimen collection/handling, submission of specimen other than nasopharyngeal swab, presence of viral mutation(s) within the areas targeted by this assay, and inadequate number of viral copies(<138 copies/mL). A negative result must be combined with clinical observations, patient history, and epidemiological information. The expected result is Negative.  Fact Sheet for Patients:  EntrepreneurPulse.com.au  Fact  Sheet for Healthcare Providers:  IncredibleEmployment.be  This test is no t yet approved or cleared by the [place] FDA and  has been authorized for detection and/or diagnosis of SARS-CoV-2 by FDA under an Emergency Use Authorization (EUA). This EUA will remain  in effect (meaning this test can be used) for the duration of the COVID-19 declaration under Section 564(b)(1) of the Act, 21 U.S.C.section 360bbb-3(b)(1), unless the authorization is terminated  or revoked sooner.       Influenza A by PCR NEGATIVE NEGATIVE Final   Influenza B by PCR NEGATIVE NEGATIVE Final    Comment: (NOTE) The Xpert Xpress SARS-CoV-2/FLU/RSV plus assay is intended as an aid in the diagnosis of influenza from Nasopharyngeal swab specimens and should not be used as a sole basis for treatment. Nasal washings and aspirates are unacceptable for Xpert Xpress SARS-CoV-2/FLU/RSV testing.  Fact Sheet for Patients: EntrepreneurPulse.com.au  Fact Sheet for Healthcare Providers: IncredibleEmployment.be  This test is not yet approved or cleared by the [place] FDA and has been authorized for detection and/or diagnosis of SARS-CoV-2 by FDA under an Emergency Use Authorization (EUA). This EUA will remain in effect (meaning this test can be used) for the duration of the COVID-19 declaration under Section 564(b)(1) of the Act, 21 U.S.C. section 360bbb-3(b)(1), unless the authorization is terminated or revoked.  Performed at [facility], [place] [address], [place], [place] [address]      Time coordinating discharge: Over 30 minutes  SIGNED:   Paytin Ramakrishnan J [place] DO  [facility] 01/15/2021, 10:36 AM

## 2021-01-15 NOTE — Progress Notes (Signed)
Pt is alert and oriented. Discharge instructions/ AVS given to pt. 

## 2021-01-15 NOTE — Discharge Instructions (Addendum)

## 2021-01-15 NOTE — Progress Notes (Addendum)
Physical Therapy Treatment & Discharge Patient Details Name: Autumn [name] MRN: [id number] DOB: [birth year] Today's Date: 01/15/2021    History of Present Illness Pt is a 65 y.o. female admitted 01/11/21 with c/o palpitations, SOB; workup for afib with RVR, COPD exacerbation. PMH includes COPD, afib, CAD, polysubstance abuse, fibromyalgia, depression, anxiety, ADHD.   PT Comments    Pt progressing well with mobility. Able to perform ambulation throughout room and multiple ADL tasks (showering, toileting, getting dressed) independent without assist. Pt reports improving R knee pain. Pt still with DOE during mobility requiring cues for activity pacing and pursed lip breathing. Session performed on RA; unable to get reliable SpO2 reading via finger probe. Pt preparing for d/c this afternoon; no longer feel pt requires follow-up with HHPT services (CM aware). Pt reports no further questions or concerns; will d/c acute PT.    Follow Up Recommendations  No PT follow up     Equipment Recommendations  None recommended by PT    Recommendations for Other Services       Precautions / Restrictions Precautions Precautions: Fall Precaution Comments: R knee pain (has compression brace in room) Restrictions Weight Bearing Restrictions: No    Mobility  Bed Mobility Overal bed mobility: Modified Independent                  Transfers Overall transfer level: Independent Equipment used: None             General transfer comment: Multiple sit<>stands from EOB and shower chair indep  Ambulation/Gait Ambulation/Gait assistance: Modified independent (Device/Increase time) Gait Distance (Feet): 80 Feet Assistive device: None Gait Pattern/deviations: Step-through pattern;Decreased stride length;Antalgic Gait velocity: Decreased   General Gait Details: Slow, steady gait without DME; mod indep due to slight antalgic pattern with c/o R knee pain   Stairs             Wheelchair  Mobility    Modified Rankin (Stroke Patients Only)       Balance Overall balance assessment: Modified Independent   Sitting balance-Leahy Scale: Good Sitting balance - Comments: Able to perform bathing tasks sitting/standing in shower without assist (as well as donning shirt, pants, socks, shoes sitting/standing EOB)     Standing balance-Leahy Scale: Good Standing balance comment: Able to perform ADL tasks standing in shower and at sink without assist             High level balance activites: Side stepping;Backward walking;Direction changes;Turns;Sudden stops;Head turns High Level Balance Comments: No overt instability or LOB with higher level balance tasks observed during session            Cognition Arousal/Alertness: Awake/alert Behavior During Therapy: WFL for tasks assessed/performed Overall Cognitive Status: Within Functional Limits for tasks assessed                                 General Comments: Cognition WFL for majority of tasks; pt requires intermittent cues to "slow down" and "catch breath" as pt moving very quickly through mobility tasks      Exercises      General Comments General comments (skin integrity, edema, etc.): Session performed on RA, unable to get pulse ox reading via finger probe; pt with DOE 3/4 during some ADL/mobiltiy tasks requiring cues for seated rest and pursed lip breathing; reinforced educ on energy conservation strategies      Pertinent Vitals/Pain Pain Assessment: Faces Faces Pain Scale: Hurts a little bit  Pain Location: R knee with mobility Pain Descriptors / Indicators: Discomfort Pain Intervention(s): Monitored during session    Home Living                      Prior Function            PT Goals (current goals can now be found in the care plan section) Progress towards PT goals: Progressing toward goals    Frequency    Min 3X/week      PT Plan Discharge plan needs to be  updated;Frequency needs to be updated    Co-evaluation              AM-PAC PT "6 Clicks" Mobility   Outcome Measure  Help needed turning from your back to your side while in a flat bed without using bedrails?: None Help needed moving from lying on your back to sitting on the side of a flat bed without using bedrails?: None Help needed moving to and from a bed to a chair (including a wheelchair)?: None Help needed standing up from a chair using your arms (e.g., wheelchair or bedside chair)?: None Help needed to walk in hospital room?: None Help needed climbing 3-5 steps with a railing? : A Little 6 Click Score: 23    End of Session   Activity Tolerance: Patient tolerated treatment well Patient left: in bed;with call bell/phone within reach Nurse Communication: Mobility status PT Visit Diagnosis: Unsteadiness on feet (R26.81);Other abnormalities of gait and mobility (R26.89);Muscle weakness (generalized) (M62.81);Pain Pain - Right/Left: Right Pain - part of body: Knee     Time: 2956-2130 PT Time Calculation (min) (ACUTE ONLY): 21 min  Charges:  $Therapeutic Activity: 8-22 mins                     Ina Homes, PT, DPT Acute Rehabilitation Services  Pager [phone] Office [phone]  Malachy Chamber 01/15/2021, 1:05 PM

## 2021-01-15 NOTE — Care Management Important Message (Signed)
Important Message  Patient Details  Name: Autumn [name] MRN: [id number] Date of Birth: [birth year]   Medicare Important Message Given:  Yes     Dorena Bodo 01/15/2021, 1:39 PM

## 2021-01-15 NOTE — TOC Transition Note (Signed)
Transition of Care [facility]) - CM/SW Discharge Note   Patient Details  Name: ONYINYECHI HUANTE MRN: [id number] Date of Birth: Jul 12, 1956  Transition of Care [facility]) CM/SW Contact:  Terrial Rhodes, LCSWA Phone Number: 01/15/2021, 11:00 AM   Clinical Narrative:     Patient will DC to: [address] [place] [address]  Anticipated DC date: 01/15/2021  Family notified: Patient declined  Transport by: Tressie Ellis transportation  ?  Per MD patient ready for DC to [address] [facility] [place] [address] . RN and patient notified of DC.[facility] transport requested for patient.  CSW signing off.          Patient Goals and CMS Choice        Discharge Placement                       Discharge Plan and Services                                     Social Determinants of Health (SDOH) Interventions     Readmission Risk Interventions No flowsheet data found.

## 2021-01-22 ENCOUNTER — Telehealth (HOSPITAL_COMMUNITY): Payer: Self-pay | Admitting: Pharmacist

## 2021-01-22 NOTE — Telephone Encounter (Signed)
Attempted to reach patient.  No answer, left voicemail.  Will attempt a call back.

## 2021-01-23 ENCOUNTER — Other Ambulatory Visit (HOSPITAL_COMMUNITY): Payer: Self-pay

## 2021-01-23 ENCOUNTER — Telehealth (HOSPITAL_COMMUNITY): Payer: Self-pay

## 2021-01-23 NOTE — Telephone Encounter (Signed)
Pharmacy Transitions of Care Follow-up Telephone Call  Date of discharge: 01/15/21  Discharge Diagnosis: Afib  How have you been since you were released from the hospital?  Patient has been doing well, no signs of bleeding and is receiving help with her medications.  Medication changes made at discharge: START taking: nicotine (NICODERM CQ - dosed in mg/24 hours)  This medication has multiple start dates; refer to the medication list belowfor more details. nicotine (NICODERM CQ - dosed in mg/24 hr)  This medication has multiple start dates; refer to the medication list belowfor more details. pantoprazole (Protonix)   CHANGE how you take: metoprolol succinate (TOPROL-XL)   STOP taking: cloNIDine 0.1 MG tablet (CATAPRES)  furosemide 20 MG tablet (LASIX)  lisinopril 10 MG tablet (ZESTRIL)  omeprazole 20 MG capsule (PRILOSEC)   Medication changes verified by the patient? Verified Apixaban but patient doesn't handle her own prescriptions and did not have access to medications at time of call.  Medication Accessibility:  Home Pharmacy: [facility]    Was the patient provided with refills on discharged medications? yes   Have all prescriptions been transferred from [facility] to home pharmacy?  yes  Is the patient able to afford medications? Has AARP    Medication Review: APIXABAN (ELIQUIS)  Apixaban 5 mg BID initiated on 01/15/21.   - Discussed importance of taking medication around the same time everyday  - Reviewed potential DDIs with patient  - Advised patient of medications to avoid (NSAIDs, ASA)  - Educated that Tylenol (acetaminophen) will be the preferred analgesic to prevent risk of bleeding  - Emphasized importance of monitoring for signs and symptoms of bleeding (abnormal bruising, prolonged bleeding, nose bleeds, bleeding from gums, discolored urine, black tarry stools)  - Advised patient to alert all providers of anticoagulation therapy prior to starting a new  medication or having a procedure   Follow-up Appointments:  PCP Hospital f/u appt confirmed? 01/27/21 family med with [facility] f/u appt confirmed? 01/29/21 with Cardiology with Dr. Charlean Merl   If their condition worsens, is the pt aware to call PCP or go to the Emergency Dept.? yes  Final Patient Assessment: Patient doing well, has follow ups scheduled and refills at home pharmacy.

## 2021-01-29 ENCOUNTER — Ambulatory Visit (HOSPITAL_COMMUNITY): Payer: 59 | Admitting: Physician Assistant

## 2021-02-05 ENCOUNTER — Ambulatory Visit (HOSPITAL_COMMUNITY)
Admission: RE | Admit: 2021-02-05 | Discharge: 2021-02-05 | Disposition: A | Discharge status: Home / Self Care | Payer: 59 | Source: Ambulatory Visit | Attending: Physician Assistant | Admitting: Physician Assistant

## 2021-02-05 ENCOUNTER — Encounter (HOSPITAL_COMMUNITY): Payer: Self-pay | Admitting: Physician Assistant

## 2021-02-05 ENCOUNTER — Other Ambulatory Visit: Payer: Self-pay

## 2021-02-05 VITALS — BP 126/70 | HR 79 | Ht 65.0 in | Wt 234.0 lb

## 2021-02-05 DIAGNOSIS — Z729 Problem related to lifestyle, unspecified: Secondary | ICD-10-CM | POA: Insufficient documentation

## 2021-02-05 DIAGNOSIS — E669 Obesity, unspecified: Secondary | ICD-10-CM | POA: Insufficient documentation

## 2021-02-05 DIAGNOSIS — I10 Essential (primary) hypertension: Secondary | ICD-10-CM | POA: Diagnosis not present

## 2021-02-05 DIAGNOSIS — Z7951 Long term (current) use of inhaled steroids: Secondary | ICD-10-CM | POA: Insufficient documentation

## 2021-02-05 DIAGNOSIS — I480 Paroxysmal atrial fibrillation: Secondary | ICD-10-CM | POA: Insufficient documentation

## 2021-02-05 DIAGNOSIS — J449 Chronic obstructive pulmonary disease, unspecified: Secondary | ICD-10-CM | POA: Diagnosis not present

## 2021-02-05 DIAGNOSIS — Z6838 Body mass index (BMI) 38.0-38.9, adult: Secondary | ICD-10-CM | POA: Insufficient documentation

## 2021-02-05 DIAGNOSIS — Z79899 Other long term (current) drug therapy: Secondary | ICD-10-CM | POA: Diagnosis not present

## 2021-02-05 DIAGNOSIS — Z7182 Exercise counseling: Secondary | ICD-10-CM | POA: Insufficient documentation

## 2021-02-05 DIAGNOSIS — I2510 Atherosclerotic heart disease of native coronary artery without angina pectoris: Secondary | ICD-10-CM | POA: Diagnosis not present

## 2021-02-05 DIAGNOSIS — Z95820 Peripheral vascular angioplasty status with implants and grafts: Secondary | ICD-10-CM | POA: Diagnosis not present

## 2021-02-05 DIAGNOSIS — Z7902 Long term (current) use of antithrombotics/antiplatelets: Secondary | ICD-10-CM | POA: Insufficient documentation

## 2021-02-05 DIAGNOSIS — F17210 Nicotine dependence, cigarettes, uncomplicated: Secondary | ICD-10-CM | POA: Diagnosis not present

## 2021-02-05 DIAGNOSIS — Z7901 Long term (current) use of anticoagulants: Secondary | ICD-10-CM | POA: Insufficient documentation

## 2021-02-05 DIAGNOSIS — Z881 Allergy status to other antibiotic agents status: Secondary | ICD-10-CM | POA: Diagnosis not present

## 2021-02-05 DIAGNOSIS — D6869 Other thrombophilia: Secondary | ICD-10-CM | POA: Insufficient documentation

## 2021-02-05 DIAGNOSIS — E785 Hyperlipidemia, unspecified: Secondary | ICD-10-CM | POA: Diagnosis not present

## 2021-02-05 DIAGNOSIS — M797 Fibromyalgia: Secondary | ICD-10-CM | POA: Insufficient documentation

## 2021-02-05 DIAGNOSIS — F909 Attention-deficit hyperactivity disorder, unspecified type: Secondary | ICD-10-CM | POA: Diagnosis not present

## 2021-02-05 MED ORDER — DILTIAZEM HCL ER COATED BEADS 120 MG PO CP24: 120.0000 mg | ORAL_CAPSULE | Freq: Every day | ORAL | 3 refills | Status: DC

## 2021-02-05 NOTE — Patient Instructions (Signed)
Start Cardizem (Diltiazem) 120mg  once a day at bedtime

## 2021-02-05 NOTE — Progress Notes (Signed)
Primary Care Physician: Julieanne Manson, MD Primary Cardiologist: Dr Leeann Must [facility]) Primary Electrophysiologist: none Referring Physician: Dr [place]    Autumn [name] is a 65 y.o. female with a history of CAD, MR, HLD, polysubstance abuse, ( denies current alcohol or drug use), tobacco use, fibromyalgia, ADHD, COPD, HTN, and atrial fibrillation who presents for consultation in the [facility]. Patient is on Eliquis for a CHADS2VASC score of 3, as well as plavix for h/o stents.  Patieint presented to the ED 01/11/21 with heart racing and SOB, with afib with RVR. DCCV was attempted in the ED but was unsuccessful, she was admitted. She then converted with IV diltiazem. Echo showed LVEF 60-65%, no regional wall motion abnormalities with mild LVH and normal diastolic parameters.  In the clinic today she is in SR with pac's. She has not noted any further aib. States her last episode was one year ago. She is established with a cardiologist at [facility]. She recently became homeless due the woman she was livig with moved to [place]. She is now staying in [facility] Is trying to stop smoking.   Today, she denies symptoms of palpitations, chest pain, shortness of breath, orthopnea, PND, lower extremity edema, dizziness, presyncope, syncope, snoring, daytime somnolence, bleeding, or neurologic sequela. The patient is tolerating medications without difficulties and is otherwise without complaint today.    Atrial Fibrillation Risk Factors:  she does not  have symptoms or diagnosis of sleep apnea. she does not have a history of rheumatic fever. she does have a history of alcohol use. The patient does not have a history of early familial atrial fibrillation or other arrhythmias.  she has a BMI of Body mass index is 38.94 kg/m.Marland Kitchen Filed Weights   02/05/21 0935  Weight: 106.1 kg    No family history on file.   Atrial Fibrillation Management history:  Previous  antiarrhythmic drugs: none Previous cardioversions: 01/11/21 Previous ablations: none CHADS2VASC score: 3 Anticoagulation history: Eliquis   Past Medical History:  Diagnosis Date   Anxiety    Arthritis    Atrial fibrillation (HCC)    Attention deficit disorder (ADD)    COPD (chronic obstructive pulmonary disease) (HCC)    Depression    Drug abuse (HCC)    Fibromyalgia    Hyperlipidemia    Past Surgical History:  Procedure Laterality Date   ANKLE SURGERY     FACIAL COSMETIC SURGERY      Current Outpatient Medications  Medication Sig Dispense Refill   albuterol (PROVENTIL) (2.5 MG/3ML) 0.083% nebulizer solution Inhale 3 mLs into the lungs every 4 (four) hours as needed for shortness of breath or wheezing.     albuterol (VENTOLIN HFA) 108 (90 Base) MCG/ACT inhaler Inhale 2 puffs into the lungs every 4 (four) hours as needed. 8.5 g 2   amphetamine-dextroamphetamine (ADDERALL) 20 MG tablet Take 20 mg by mouth 3 (three) times daily.     apixaban (ELIQUIS) 5 MG TABS tablet Take 1 tablet (5 mg total) by mouth in the morning and at bedtime. 60 tablet 2   ARIPiprazole (ABILIFY) 10 MG tablet Take 1 tablet (10 mg total) by mouth daily. 30 tablet 2   atorvastatin (LIPITOR) 40 MG tablet Take 1 tablet (40 mg total) by mouth daily. 30 tablet 2   buprenorphine-naloxone (SUBOXONE) 8-2 mg SUBL SL tablet Place 1 tablet daily under the tongue. 30 tablet    busPIRone (BUSPAR) 10 MG tablet Take 1 tablet (10 mg total) by mouth in  the morning and at bedtime. 60 tablet 2   clopidogrel (PLAVIX) 75 MG tablet Take 1 tablet (75 mg total) by mouth daily. 30 tablet 2   cyclobenzaprine (FLEXERIL) 10 MG tablet Take 10 mg by mouth 3 (three) times daily as needed for muscle spasms.     diclofenac Sodium (VOLTAREN) 1 % GEL Apply 2-4 g topically 4 (four) times daily as needed (pain).     diltiazem (CARDIZEM CD) 120 MG 24 hr capsule Take 1 capsule (120 mg total) by mouth at bedtime. 30 capsule 3   DULoxetine  (CYMBALTA) 60 MG capsule Take 1 capsule (60 mg total) by mouth daily. 30 capsule 2   metoprolol succinate (TOPROL-XL) 100 MG 24 hr tablet Take 1 tablet (100 mg total) by mouth daily. Take with or immediately following a meal. 30 tablet 2   Multiple Vitamin (MULTIVITAMIN WITH MINERALS) TABS Take 1 tablet by mouth every morning.      naloxone (NARCAN) nasal spray 4 mg/0.1 mL Place 4 mg into the nose as needed (overdose).     nicotine (NICODERM CQ - DOSED IN MG/24 HR) 7 mg/24hr patch Place 1 patch (7 mg total) onto the skin daily for 21 days. 21 patch 0   pantoprazole (PROTONIX) 40 MG tablet Take 1 tablet (40 mg total) by mouth daily. 90 tablet 1   pregabalin (LYRICA) 50 MG capsule Take 1 capsule (50 mg total) by mouth 3 (three) times daily. 90 capsule 2   SPIRIVA RESPIMAT 1.25 MCG/ACT AERS Inhale 2 puffs into the lungs daily. 4 g 2   traZODone (DESYREL) 100 MG tablet Take 1 tablet (100 mg total) by mouth at bedtime as needed for sleep. 30 tablet 0   No current facility-administered medications for this encounter.    Allergies  Allergen Reactions   Nortriptyline Hcl Anaphylaxis   Doxycycline Other (See Comments)    Social History   Socioeconomic History   Marital status: Divorced    Spouse name: Not on file   Number of children: Not on file   Years of education: Not on file   Highest education level: Not on file  Occupational History   Occupation: disabled  Tobacco Use   Smoking status: Every Day    Packs/day: 0.50    Pack years: 0.00    Types: Cigarettes   Smokeless tobacco: Never   Tobacco comments:    Half pack daily going to try patches  Vaping Use   Vaping Use: Every day  Substance and Sexual Activity   Alcohol use: Not Currently    Comment: A fifth of vodka a day - none in a year   Drug use: Yes    Types: Cocaine, Marijuana    Comment: uses marijuana regularly; used cocaine " a long time ago"   Sexual activity: Not on file  Other Topics Concern   Not on file  Social  History Narrative   Not on file   Social Determinants of Health   Financial Resource Strain: Not on file  Food Insecurity: Not on file  Transportation Needs: Not on file  Physical Activity: Not on file  Stress: Not on file  Social Connections: Not on file  Intimate Partner Violence: Not on file     ROS- All systems are reviewed and negative except as per the HPI above.  Physical Exam: Vitals:   02/05/21 0935  BP: 126/70  Pulse: 79  Weight: 106.1 kg  Height: 5\' 5"  (1.651 m)    GEN- The patient is  a well appearing female, alert and oriented x 3 today.   Head- normocephalic, atraumatic Eyes-  Sclera clear, conjunctiva pink Ears- hearing intact Oropharynx- clear Neck- supple  Lungs- scattered  wheezes bilaterally, normal work of breathing Heart- Regular rate and rhythm, no murmurs, rubs or gallops  GI- soft, NT, ND, + BS Extremities- no clubbing, cyanosis, or edema MS- no significant deformity or atrophy Skin- no rash or lesion Psych- euthymic mood, full affect Neuro- strength and sensation are intact  Wt Readings from Last 3 Encounters:  02/05/21 106.1 kg  01/15/21 110.5 kg  06/26/17 101.9 kg    EKG today demonstrates  SR with frequent PAC's   Echo 01/12/21 demonstrated  1. Left ventricular ejection fraction, by estimation, is 60 to 65%. The  left ventricle has normal function. The left ventricle has no regional  wall motion abnormalities. There is mild left ventricular hypertrophy.  Left ventricular diastolic parameters  were normal.   2. Right ventricular systolic function is normal. The right ventricular  size is normal. There is normal pulmonary artery systolic pressure. The  estimated right ventricular systolic pressure is 29.6 mmHg.   3. Left atrial size was mildly dilated.   4. Right atrial size was mildly dilated.   5. The mitral valve is normal in structure. Trivial mitral valve  regurgitation. No evidence of mitral stenosis.   6. The aortic valve  was not well visualized. Aortic valve regurgitation  is not visualized. Mild aortic valve stenosis. Vmax 2.4 m/s, MG 12 mmHg,  AVA 1.5 cm^2, DI 0.45   7. The inferior vena cava is normal in size with greater than 50%  respiratory variability, suggesting right atrial pressure of 3 mmHg.   Epic records are reviewed at length today  CHA2DS2-VASc Score = 3  The patient's score is based upon: CHF History: No HTN History: Yes Diabetes History: No Stroke History: No Vascular Disease History: No Age Score: 0 Gender Score: 1 {Click here to calculate score.    ASSESSMENT AND PLAN: 1. Paroxysmal Atrial Fibrillation (ICD10:  I48.0) The patient's CHA2DS2-VASc score is 3, indicating a 3.2% annual risk of stroke.   General education re afib and triggers  Since there are frequent PAC's and these can be a forerunner to afib, will add cardizem 120 mg a day in the pm, continue metoprolol in the am She will watch BP/HR's at home  Continue Toprol 100 mg daily Continue Eliquis 5 mg BID  2. Secondary Hypercoagulable State (ICD10:  D68.69) The patient is at significant risk for stroke/thromboembolism based upon her CHA2DS2-VASc Score of 3.  Continue Apixaban (Eliquis).   3. Obesity Body mass index is 38.94 kg/m. Lifestyle modification was discussed at length including regular exercise and weight reduction. Smoking cessation encouraged   5. HTN Stable today She will watch BP at home with addition of CCB, take at hs to offset effects Let office know if sys BP runs less 100 systolic  6. CAD No anginal symptoms.    Follow up in afib clinic in one week then anticipate returning to her cardiologist at [facility], scheduled to see him in August  Hermen Mario C. [facility] [address] [place], [place] [address] [phone]  10:11 AM

## 2021-02-14 ENCOUNTER — Ambulatory Visit (HOSPITAL_COMMUNITY): Payer: 59 | Admitting: Nurse Practitioner

## 2021-02-19 ENCOUNTER — Ambulatory Visit (HOSPITAL_COMMUNITY)
Admission: RE | Admit: 2021-02-19 | Discharge: 2021-02-19 | Disposition: A | Discharge status: Home / Self Care | Payer: 59 | Source: Ambulatory Visit | Attending: Nurse Practitioner | Admitting: Nurse Practitioner

## 2021-02-19 ENCOUNTER — Encounter (HOSPITAL_COMMUNITY): Payer: Self-pay | Admitting: Nurse Practitioner

## 2021-02-19 ENCOUNTER — Other Ambulatory Visit: Payer: Self-pay

## 2021-02-19 VITALS — BP 132/68 | HR 76 | Ht 65.0 in | Wt 238.8 lb

## 2021-02-19 DIAGNOSIS — Z7902 Long term (current) use of antithrombotics/antiplatelets: Secondary | ICD-10-CM | POA: Diagnosis not present

## 2021-02-19 DIAGNOSIS — I10 Essential (primary) hypertension: Secondary | ICD-10-CM | POA: Diagnosis not present

## 2021-02-19 DIAGNOSIS — Z5900 Homelessness unspecified: Secondary | ICD-10-CM | POA: Diagnosis not present

## 2021-02-19 DIAGNOSIS — I2510 Atherosclerotic heart disease of native coronary artery without angina pectoris: Secondary | ICD-10-CM | POA: Diagnosis not present

## 2021-02-19 DIAGNOSIS — J449 Chronic obstructive pulmonary disease, unspecified: Secondary | ICD-10-CM | POA: Diagnosis not present

## 2021-02-19 DIAGNOSIS — Z79899 Other long term (current) drug therapy: Secondary | ICD-10-CM | POA: Diagnosis not present

## 2021-02-19 DIAGNOSIS — Z6839 Body mass index (BMI) 39.0-39.9, adult: Secondary | ICD-10-CM | POA: Diagnosis not present

## 2021-02-19 DIAGNOSIS — F17210 Nicotine dependence, cigarettes, uncomplicated: Secondary | ICD-10-CM | POA: Diagnosis not present

## 2021-02-19 DIAGNOSIS — Z7901 Long term (current) use of anticoagulants: Secondary | ICD-10-CM | POA: Insufficient documentation

## 2021-02-19 DIAGNOSIS — I480 Paroxysmal atrial fibrillation: Secondary | ICD-10-CM | POA: Diagnosis present

## 2021-02-19 DIAGNOSIS — D6869 Other thrombophilia: Secondary | ICD-10-CM

## 2021-02-19 DIAGNOSIS — E669 Obesity, unspecified: Secondary | ICD-10-CM | POA: Insufficient documentation

## 2021-02-19 DIAGNOSIS — M797 Fibromyalgia: Secondary | ICD-10-CM | POA: Diagnosis not present

## 2021-02-19 DIAGNOSIS — E785 Hyperlipidemia, unspecified: Secondary | ICD-10-CM | POA: Diagnosis not present

## 2021-02-19 MED ORDER — DILTIAZEM HCL ER COATED BEADS 120 MG PO CP24
120.0000 mg | ORAL_CAPSULE | Freq: Every day | ORAL | 6 refills | Status: DC
Start: 2021-02-19 — End: 2022-12-07

## 2021-02-19 NOTE — Progress Notes (Signed)
Primary Care Physician: Julieanne Manson, MD Primary Cardiologist: Dr Leeann Must [facility]) Primary Electrophysiologist: none Referring Physician: Dr [place]    Autumn [name] is a 65 y.o. female with a history of CAD, MR, HLD, polysubstance abuse, ( denies current alcohol or drug use), tobacco use, fibromyalgia, ADHD, COPD, HTN, and atrial fibrillation who presents for consultation in the [facility]. Patient is on Eliquis for a CHADS2VASC score of 3, as well as plavix for h/o stents.  Patieint presented to the ED 01/11/21 with heart racing and SOB, with afib with RVR. DCCV was attempted in the ED but was unsuccessful, she was admitted. She then converted with IV diltiazem. Echo showed LVEF 60-65%, no regional wall motion abnormalities with mild LVH and normal diastolic parameters.  In the clinic today she is in SR with pac's. She has not noted any further aib. States her last episode was one year ago. She is established with a cardiologist at [facility]. She recently became homeless due the woman she was livig with moved to [place]. She is now staying in [facility]. Is trying to stop smoking.  F/u 02/19/21. She feels that she is staying in SR. I added Cardizem 120 mg daily on  last visit as she was having more PAC's and BP was elevated. Her weight is up 5 lbs and her ankles are more swollen today. Furosemide  is not on the med list but she has the drug to use if needed for these symptoms.BP stable today.  She still plans to f/u with her cardiologist at [facility] in the near future.    Today, she denies symptoms of palpitations, chest pain, shortness of breath, orthopnea, PND, lower extremity edema, dizziness, presyncope, syncope, snoring, daytime somnolence, bleeding, or neurologic sequela. The patient is tolerating medications without difficulties and is otherwise without complaint today.    Atrial Fibrillation Risk Factors:  she does not  have symptoms or diagnosis of  sleep apnea. she does not have a history of rheumatic fever. she does have a history of alcohol use. The patient does not have a history of early familial atrial fibrillation or other arrhythmias.  she has a BMI of Body mass index is 39.74 kg/m.Marland Kitchen Filed Weights   02/19/21 0936  Weight: 108.3 kg    No family history on file.   Atrial Fibrillation Management history:  Previous antiarrhythmic drugs: none Previous cardioversions: 01/11/21 Previous ablations: none CHADS2VASC score: 3 Anticoagulation history: Eliquis   Past Medical History:  Diagnosis Date   Anxiety    Arthritis    Atrial fibrillation (HCC)    Attention deficit disorder (ADD)    COPD (chronic obstructive pulmonary disease) (HCC)    Depression    Drug abuse (HCC)    Fibromyalgia    Hyperlipidemia    Past Surgical History:  Procedure Laterality Date   ANKLE SURGERY     FACIAL COSMETIC SURGERY      Current Outpatient Medications  Medication Sig Dispense Refill   albuterol (PROVENTIL) (2.5 MG/3ML) 0.083% nebulizer solution Inhale 3 mLs into the lungs every 4 (four) hours as needed for shortness of breath or wheezing.     albuterol (VENTOLIN HFA) 108 (90 Base) MCG/ACT inhaler Inhale 2 puffs into the lungs every 4 (four) hours as needed. 8.5 g 2   apixaban (ELIQUIS) 5 MG TABS tablet Take 1 tablet (5 mg total) by mouth in the morning and at bedtime. 60 tablet 2   ARIPiprazole (ABILIFY) 10 MG tablet Take 1  tablet (10 mg total) by mouth daily. 30 tablet 2   atorvastatin (LIPITOR) 40 MG tablet Take 1 tablet (40 mg total) by mouth daily. 30 tablet 2   buprenorphine-naloxone (SUBOXONE) 8-2 mg SUBL SL tablet Place 1 tablet daily under the tongue. (Patient taking differently: Place 1 tablet under the tongue 3 (three) times daily.) 30 tablet    busPIRone (BUSPAR) 10 MG tablet Take 1 tablet (10 mg total) by mouth in the morning and at bedtime. 60 tablet 2   clopidogrel (PLAVIX) 75 MG tablet Take 1 tablet (75 mg total) by  mouth daily. 30 tablet 2   cyclobenzaprine (FLEXERIL) 10 MG tablet Take 10 mg by mouth 3 (three) times daily as needed for muscle spasms.     diclofenac Sodium (VOLTAREN) 1 % GEL Apply 2-4 g topically 4 (four) times daily as needed (pain).     diltiazem (CARDIZEM CD) 120 MG 24 hr capsule Take 1 capsule (120 mg total) by mouth at bedtime. 30 capsule 3   DULoxetine (CYMBALTA) 60 MG capsule Take 1 capsule (60 mg total) by mouth daily. 30 capsule 2   metoprolol succinate (TOPROL-XL) 100 MG 24 hr tablet Take 1 tablet (100 mg total) by mouth daily. Take with or immediately following a meal. 30 tablet 2   Multiple Vitamin (MULTIVITAMIN WITH MINERALS) TABS Take 1 tablet by mouth every morning.      naloxone (NARCAN) nasal spray 4 mg/0.1 mL Place 4 mg into the nose as needed (overdose).     pantoprazole (PROTONIX) 40 MG tablet Take 1 tablet (40 mg total) by mouth daily. 90 tablet 1   pregabalin (LYRICA) 50 MG capsule Take 1 capsule (50 mg total) by mouth 3 (three) times daily. 90 capsule 2   SPIRIVA RESPIMAT 1.25 MCG/ACT AERS Inhale 2 puffs into the lungs daily. 4 g 2   traZODone (DESYREL) 100 MG tablet Take 1 tablet (100 mg total) by mouth at bedtime as needed for sleep. 30 tablet 0   No current facility-administered medications for this encounter.    Allergies  Allergen Reactions   Nortriptyline Hcl Anaphylaxis   Doxycycline Other (See Comments)    Social History   Socioeconomic History   Marital status: Divorced    Spouse name: Not on file   Number of children: Not on file   Years of education: Not on file   Highest education level: Not on file  Occupational History   Occupation: disabled  Tobacco Use   Smoking status: Every Day    Packs/day: 0.50    Pack years: 0.00    Types: Cigarettes   Smokeless tobacco: Never   Tobacco comments:    Half pack daily going to try patches  Vaping Use   Vaping Use: Every day  Substance and Sexual Activity   Alcohol use: Not Currently    Comment:  A fifth of vodka a day - none in a year   Drug use: Yes    Types: Cocaine, Marijuana    Comment: uses marijuana regularly; used cocaine " a long time ago"   Sexual activity: Not on file  Other Topics Concern   Not on file  Social History Narrative   Not on file   Social Determinants of Health   Financial Resource Strain: Not on file  Food Insecurity: Not on file  Transportation Needs: Not on file  Physical Activity: Not on file  Stress: Not on file  Social Connections: Not on file  Intimate Partner Violence: Not on file  ROS- All systems are reviewed and negative except as per the HPI above.  Physical Exam: Vitals:   02/19/21 0936  BP: 132/68  Pulse: 76  Weight: 108.3 kg  Height: 5\' 5"  (1.651 m)    GEN- The patient is a well appearing female, alert and oriented x 3 today.   Head- normocephalic, atraumatic Eyes-  Sclera clear, conjunctiva pink Ears- hearing intact Oropharynx- clear Neck- supple  Lungs- scattered  wheezes bilaterally, normal work of breathing Heart- Regular rate and rhythm, no murmurs, rubs or gallops  GI- soft, NT, ND, + BS Extremities- no clubbing, cyanosis, or edema MS- no significant deformity or atrophy Skin- no rash or lesion Psych- euthymic mood, full affect Neuro- strength and sensation are intact  Wt Readings from Last 3 Encounters:  02/19/21 108.3 kg  02/05/21 106.1 kg  01/15/21 110.5 kg    EKG today demonstrates  SR with one PAC noted, pr int 162 ms, qrs int 86 ms, qtc 441 ms   Echo 01/12/21 demonstrated  1. Left ventricular ejection fraction, by estimation, is 60 to 65%. The  left ventricle has normal function. The left ventricle has no regional  wall motion abnormalities. There is mild left ventricular hypertrophy.  Left ventricular diastolic parameters  were normal.   2. Right ventricular systolic function is normal. The right ventricular  size is normal. There is normal pulmonary artery systolic pressure. The  estimated  right ventricular systolic pressure is 29.6 mmHg.   3. Left atrial size was mildly dilated.   4. Right atrial size was mildly dilated.   5. The mitral valve is normal in structure. Trivial mitral valve  regurgitation. No evidence of mitral stenosis.   6. The aortic valve was not well visualized. Aortic valve regurgitation  is not visualized. Mild aortic valve stenosis. Vmax 2.4 m/s, MG 12 mmHg,  AVA 1.5 cm^2, DI 0.45   7. The inferior vena cava is normal in size with greater than 50%  respiratory variability, suggesting right atrial pressure of 3 mmHg.   Epic records are reviewed at length today  CHA2DS2-VASc Score = 3  The patient's score is based upon: CHF History: No HTN History: Yes Diabetes History: No Stroke History: No Vascular Disease History: No Age Score: 0 Gender Score: 1 {Click here to calculate score.    ASSESSMENT AND PLAN: 1. Paroxysmal Atrial Fibrillation (ICD10:  I48.0) The patient's CHA2DS2-VASc score is 3, indicating a 3.2% annual risk of stroke.   Since there are frequent PAC's and these can be a forerunner to afib, I added cardizem 120 mg a day in the pm, continue metoprolol in the am Continue Toprol 100 mg daily Continue Eliquis 5 mg BID  2. Secondary Hypercoagulable State (ICD10:  D68.69) The patient is at significant risk for stroke/thromboembolism based upon her CHA2DS2-VASc Score of 3.  Continue Apixaban (Eliquis).   3. Obesity Body mass index is 39.74 kg/m. Lifestyle modification was discussed at length including regular exercise and weight reduction. Smoking cessation encouraged   4. Pedal edema Takes furosemide  as needed   5. HTN Stable today  6. CAD No anginal symptoms.    Follow up with her cardiologist at [facility], scheduled to see him in August  Autumn [name] C. 08-22-2005 [facility] [address] [place], [place] [phone]  10:18 AM

## 2022-09-17 ENCOUNTER — Encounter (HOSPITAL_COMMUNITY): Payer: Self-pay | Admitting: *Deleted

## 2022-12-03 ENCOUNTER — Other Ambulatory Visit: Payer: Self-pay

## 2022-12-03 ENCOUNTER — Encounter (HOSPITAL_COMMUNITY): Payer: Self-pay | Admitting: Emergency Medicine

## 2022-12-03 ENCOUNTER — Inpatient Hospital Stay (HOSPITAL_COMMUNITY)
Admission: EM | Admit: 2022-12-03 | Discharge: 2022-12-07 | DRG: 308 | Disposition: A | Discharge status: Home / Self Care | Payer: 59 | Attending: Family Medicine | Admitting: Family Medicine

## 2022-12-03 ENCOUNTER — Observation Stay (HOSPITAL_COMMUNITY): Payer: 59

## 2022-12-03 ENCOUNTER — Emergency Department (HOSPITAL_COMMUNITY): Payer: 59

## 2022-12-03 DIAGNOSIS — Z955 Presence of coronary angioplasty implant and graft: Secondary | ICD-10-CM | POA: Diagnosis present

## 2022-12-03 DIAGNOSIS — Z87898 Personal history of other specified conditions: Secondary | ICD-10-CM | POA: Diagnosis present

## 2022-12-03 DIAGNOSIS — F1510 Other stimulant abuse, uncomplicated: Secondary | ICD-10-CM | POA: Diagnosis present

## 2022-12-03 DIAGNOSIS — R7989 Other specified abnormal findings of blood chemistry: Secondary | ICD-10-CM

## 2022-12-03 DIAGNOSIS — I4821 Permanent atrial fibrillation: Principal | ICD-10-CM | POA: Diagnosis present

## 2022-12-03 DIAGNOSIS — I5022 Chronic systolic (congestive) heart failure: Secondary | ICD-10-CM | POA: Diagnosis not present

## 2022-12-03 DIAGNOSIS — R0602 Shortness of breath: Secondary | ICD-10-CM

## 2022-12-03 DIAGNOSIS — Z7901 Long term (current) use of anticoagulants: Secondary | ICD-10-CM

## 2022-12-03 DIAGNOSIS — Z881 Allergy status to other antibiotic agents status: Secondary | ICD-10-CM

## 2022-12-03 DIAGNOSIS — G4733 Obstructive sleep apnea (adult) (pediatric): Secondary | ICD-10-CM | POA: Diagnosis present

## 2022-12-03 DIAGNOSIS — I4819 Other persistent atrial fibrillation: Secondary | ICD-10-CM | POA: Diagnosis present

## 2022-12-03 DIAGNOSIS — I4891 Unspecified atrial fibrillation: Secondary | ICD-10-CM | POA: Diagnosis present

## 2022-12-03 DIAGNOSIS — Z1152 Encounter for screening for COVID-19: Secondary | ICD-10-CM

## 2022-12-03 DIAGNOSIS — F17290 Nicotine dependence, other tobacco product, uncomplicated: Secondary | ICD-10-CM | POA: Diagnosis present

## 2022-12-03 DIAGNOSIS — R7309 Other abnormal glucose: Secondary | ICD-10-CM

## 2022-12-03 DIAGNOSIS — E785 Hyperlipidemia, unspecified: Secondary | ICD-10-CM | POA: Diagnosis present

## 2022-12-03 DIAGNOSIS — F17210 Nicotine dependence, cigarettes, uncomplicated: Secondary | ICD-10-CM | POA: Diagnosis present

## 2022-12-03 DIAGNOSIS — M797 Fibromyalgia: Secondary | ICD-10-CM | POA: Diagnosis present

## 2022-12-03 DIAGNOSIS — F1910 Other psychoactive substance abuse, uncomplicated: Secondary | ICD-10-CM | POA: Diagnosis present

## 2022-12-03 DIAGNOSIS — F1210 Cannabis abuse, uncomplicated: Secondary | ICD-10-CM | POA: Diagnosis present

## 2022-12-03 DIAGNOSIS — J9601 Acute respiratory failure with hypoxia: Secondary | ICD-10-CM | POA: Diagnosis present

## 2022-12-03 DIAGNOSIS — I2510 Atherosclerotic heart disease of native coronary artery without angina pectoris: Secondary | ICD-10-CM | POA: Diagnosis present

## 2022-12-03 DIAGNOSIS — G20A1 Parkinson's disease without dyskinesia, without mention of fluctuations: Secondary | ICD-10-CM

## 2022-12-03 DIAGNOSIS — J449 Chronic obstructive pulmonary disease, unspecified: Secondary | ICD-10-CM

## 2022-12-03 DIAGNOSIS — J441 Chronic obstructive pulmonary disease with (acute) exacerbation: Secondary | ICD-10-CM | POA: Diagnosis present

## 2022-12-03 DIAGNOSIS — I509 Heart failure, unspecified: Secondary | ICD-10-CM

## 2022-12-03 DIAGNOSIS — I110 Hypertensive heart disease with heart failure: Secondary | ICD-10-CM | POA: Diagnosis present

## 2022-12-03 DIAGNOSIS — I5023 Acute on chronic systolic (congestive) heart failure: Secondary | ICD-10-CM | POA: Diagnosis present

## 2022-12-03 DIAGNOSIS — R739 Hyperglycemia, unspecified: Secondary | ICD-10-CM

## 2022-12-03 DIAGNOSIS — I739 Peripheral vascular disease, unspecified: Secondary | ICD-10-CM | POA: Diagnosis present

## 2022-12-03 DIAGNOSIS — Z9981 Dependence on supplemental oxygen: Secondary | ICD-10-CM

## 2022-12-03 DIAGNOSIS — Z79899 Other long term (current) drug therapy: Secondary | ICD-10-CM

## 2022-12-03 DIAGNOSIS — Z91148 Patient's other noncompliance with medication regimen for other reason: Secondary | ICD-10-CM

## 2022-12-03 HISTORY — DX: Presence of coronary angioplasty implant and graft: Z95.5

## 2022-12-03 HISTORY — DX: Unspecified systolic (congestive) heart failure: I50.20

## 2022-12-03 LAB — LIPID PANEL
Cholesterol: 180 mg/dL (ref 0–200)
HDL: 58 mg/dL (ref >40)
LDL Cholesterol: 109 mg/dL — ABNORMAL HIGH (ref 0–99)
Total CHOL/HDL Ratio: 3.1 RATIO
Triglycerides: 65 mg/dL (ref <150)
VLDL: 13 mg/dL (ref 0–40)

## 2022-12-03 LAB — CBC WITH DIFFERENTIAL/PLATELET
Abs Immature Granulocytes: 0.04 10*3/uL (ref 0.00–0.07)
Basophils Absolute: 0 10*3/uL (ref 0.0–0.1)
Basophils Relative: 0 %
Eosinophils Absolute: 0.1 10*3/uL (ref 0.0–0.5)
Eosinophils Relative: 1 %
HCT: 38.7 % (ref 36.0–46.0)
Hemoglobin: 12.6 g/dL (ref 12.0–15.0)
Immature Granulocytes: 0 %
Lymphocytes Relative: 19 %
Lymphs Abs: 2.2 10*3/uL (ref 0.7–4.0)
MCH: 27.9 pg (ref 26.0–34.0)
MCHC: 32.6 g/dL (ref 30.0–36.0)
MCV: 85.8 fL (ref 80.0–100.0)
Monocytes Absolute: 0.5 10*3/uL (ref 0.1–1.0)
Monocytes Relative: 5 %
Neutro Abs: 8.7 10*3/uL — ABNORMAL HIGH (ref 1.7–7.7)
Neutrophils Relative %: 75 %
Platelets: 249 10*3/uL (ref 150–400)
RBC: 4.51 MIL/uL (ref 3.87–5.11)
RDW: 16.6 % — ABNORMAL HIGH (ref 11.5–15.5)
WBC: 11.6 10*3/uL — ABNORMAL HIGH (ref 4.0–10.5)
nRBC: 0 % (ref 0.0–0.2)

## 2022-12-03 LAB — RAPID URINE DRUG SCREEN, HOSP PERFORMED
Amphetamines: POSITIVE — AB
Barbiturates: NOT DETECTED
Benzodiazepines: NOT DETECTED
Cocaine: NOT DETECTED
Opiates: NOT DETECTED
Tetrahydrocannabinol: POSITIVE — AB

## 2022-12-03 LAB — BASIC METABOLIC PANEL
Anion gap: 10 (ref 5–15)
BUN: 5 mg/dL — ABNORMAL LOW (ref 8–23)
CO2: 25 mmol/L (ref 22–32)
Calcium: 8.9 mg/dL (ref 8.9–10.3)
Chloride: 103 mmol/L (ref 98–111)
Creatinine, Ser: 0.85 mg/dL (ref 0.44–1.00)
GFR, Estimated: >60 mL/min (ref >60)
Glucose, Bld: 158 mg/dL — ABNORMAL HIGH (ref 70–99)
Potassium: 3.5 mmol/L (ref 3.5–5.1)
Sodium: 138 mmol/L (ref 135–145)

## 2022-12-03 LAB — ECHOCARDIOGRAM COMPLETE
Calc EF: 35.1 %
S' Lateral: 3.7 cm
Single Plane A2C EF: 42.5 %
Single Plane A4C EF: 33 %
Weight: 3872 oz

## 2022-12-03 LAB — MAGNESIUM: Magnesium: 1.9 mg/dL (ref 1.7–2.4)

## 2022-12-03 LAB — SARS CORONAVIRUS 2 BY RT PCR: SARS Coronavirus 2 by RT PCR: NEGATIVE

## 2022-12-03 LAB — TROPONIN I (HIGH SENSITIVITY)
Troponin I (High Sensitivity): 18 ng/L — ABNORMAL HIGH (ref <18)
Troponin I (High Sensitivity): 17 ng/L (ref <18)

## 2022-12-03 LAB — HIV ANTIBODY (ROUTINE TESTING W REFLEX): HIV Screen 4th Generation wRfx: NONREACTIVE

## 2022-12-03 LAB — TSH: TSH: 0.846 u[IU]/mL (ref 0.350–4.500)

## 2022-12-03 LAB — BRAIN NATRIURETIC PEPTIDE: B Natriuretic Peptide: 283 pg/mL — ABNORMAL HIGH (ref 0.0–100.0)

## 2022-12-03 MED ORDER — METOPROLOL TARTRATE 25 MG PO TABS
25.0000 mg | ORAL_TABLET | Freq: Once | ORAL | Status: AC
  Administered 2022-12-03: 25 mg via ORAL
  Filled 2022-12-03: qty 1

## 2022-12-03 MED ORDER — METOPROLOL SUCCINATE ER 50 MG PO TB24
50.0000 mg | ORAL_TABLET | Freq: Every day | ORAL | Status: DC
  Administered 2022-12-03 – 2022-12-04 (×2): 50 mg via ORAL
  Filled 2022-12-03 (×2): qty 1

## 2022-12-03 MED ORDER — DILTIAZEM HCL 25 MG/5ML IV SOLN
10.0000 mg | Freq: Once | INTRAVENOUS | Status: AC
  Administered 2022-12-03: 10 mg via INTRAVENOUS
  Filled 2022-12-03: qty 5

## 2022-12-03 MED ORDER — APIXABAN 5 MG PO TABS
5.0000 mg | ORAL_TABLET | Freq: Two times a day (BID) | ORAL | Status: DC
  Administered 2022-12-03 – 2022-12-07 (×9): 5 mg via ORAL
  Filled 2022-12-03 (×9): qty 1

## 2022-12-03 MED ORDER — FLUTICASONE PROPIONATE 50 MCG/ACT NA SUSP
1.0000 | Freq: Every day | NASAL | Status: DC
  Administered 2022-12-03 – 2022-12-07 (×5): 1 via NASAL
  Filled 2022-12-03: qty 16

## 2022-12-03 MED ORDER — BUPRENORPHINE HCL-NALOXONE HCL 8-2 MG SL SUBL
1.0000 | SUBLINGUAL_TABLET | Freq: Three times a day (TID) | SUBLINGUAL | Status: DC
  Administered 2022-12-03 – 2022-12-07 (×12): 1 via SUBLINGUAL
  Filled 2022-12-03 (×13): qty 1

## 2022-12-03 MED ORDER — DILTIAZEM HCL-DEXTROSE 125-5 MG/125ML-% IV SOLN (PREMIX)
5.0000 mg/h | INTRAVENOUS | Status: DC
  Administered 2022-12-03: 12 mg/h via INTRAVENOUS
  Administered 2022-12-03: 5 mg/h via INTRAVENOUS
  Filled 2022-12-03 (×2): qty 125

## 2022-12-03 MED ORDER — DILTIAZEM LOAD VIA INFUSION
10.0000 mg | Freq: Once | INTRAVENOUS | Status: AC
  Administered 2022-12-03: 10 mg via INTRAVENOUS
  Filled 2022-12-03: qty 10

## 2022-12-03 MED ORDER — UMECLIDINIUM BROMIDE 62.5 MCG/ACT IN AEPB
1.0000 | INHALATION_SPRAY | Freq: Every day | RESPIRATORY_TRACT | Status: DC
  Administered 2022-12-03: 1 via RESPIRATORY_TRACT
  Filled 2022-12-03: qty 7

## 2022-12-03 MED ORDER — IPRATROPIUM-ALBUTEROL 0.5-2.5 (3) MG/3ML IN SOLN
3.0000 mL | RESPIRATORY_TRACT | Status: DC | PRN
  Administered 2022-12-05: 3 mL via RESPIRATORY_TRACT
  Filled 2022-12-03: qty 12

## 2022-12-03 MED ORDER — ALBUTEROL SULFATE (2.5 MG/3ML) 0.083% IN NEBU: 3.0000 mL | INHALATION_SOLUTION | RESPIRATORY_TRACT | Status: DC | PRN

## 2022-12-03 MED ORDER — CARBIDOPA-LEVODOPA 25-100 MG PO TABS
1.0000 | ORAL_TABLET | Freq: Three times a day (TID) | ORAL | Status: DC
  Administered 2022-12-03 – 2022-12-07 (×12): 1 via ORAL
  Filled 2022-12-03 (×15): qty 1

## 2022-12-03 MED ORDER — ATORVASTATIN CALCIUM 40 MG PO TABS
40.0000 mg | ORAL_TABLET | Freq: Every day | ORAL | Status: DC
  Administered 2022-12-03 – 2022-12-04 (×2): 40 mg via ORAL
  Filled 2022-12-03 (×2): qty 1

## 2022-12-03 NOTE — ED Provider Notes (Signed)
[place] EMERGENCY DEPARTMENT AT [facility] Provider Note   CSN: [id number] Arrival date & time: 12/03/22  0138     History  Chief Complaint  Patient presents with   Shortness of Breath    Autumn [name] is a 67 y.o. female.  The history is provided by the patient.  Shortness of Breath She has history of hyperlipidemia, COPD, atrial fibrillation anticoagulated on apixaban and comes in because of feeling like she had a panic attack.  She noted shortness of breath, slight chest discomfort, and heart sense of heart racing.  This was at about midnight.  This is the way she feels when she goes into atrial fibrillation with rapid ventricular response.  She states that she spends most of her time in atrial fibrillation.  She has been compliant with her medications and denies any missed doses.   Home Medications Prior to Admission medications   Medication Sig Start Date End Date Taking? Authorizing Provider  albuterol (PROVENTIL) (2.5 MG/3ML) 0.083% nebulizer solution Inhale 3 mLs into the lungs every 4 (four) hours as needed for shortness of breath or wheezing. 08/22/20   [provider]  albuterol (VENTOLIN HFA) 108 (90 Base) MCG/ACT inhaler Inhale 2 puffs into the lungs every 4 (four) hours as needed. 01/15/21 04/15/21  [place], Alvira Philips, DO  apixaban (ELIQUIS) 5 MG TABS tablet Take 1 tablet (5 mg total) by mouth in the morning and at bedtime. 01/15/21 04/15/21  [place], Alvira Philips, DO  ARIPiprazole (ABILIFY) 10 MG tablet Take 1 tablet (10 mg total) by mouth daily. 01/15/21 04/15/21  [place], Alvira Philips, DO  atorvastatin (LIPITOR) 40 MG tablet Take 1 tablet (40 mg total) by mouth daily. 01/15/21 04/15/21  [place], Alvira Philips, DO  buprenorphine-naloxone (SUBOXONE) 8-2 mg SUBL SL tablet Place 1 tablet daily under the tongue. Patient taking differently: Place 1 tablet under the tongue 3 (three) times daily. 06/28/17   Philip Aspen, Limmie Patricia, MD  cyclobenzaprine (FLEXERIL) 10 MG tablet Take 10 mg  by mouth 3 (three) times daily as needed for muscle spasms. 12/27/20   [provider]  diclofenac Sodium (VOLTAREN) 1 % GEL Apply 2-4 g topically 4 (four) times daily as needed (pain). 12/18/20   [provider]  diltiazem (CARDIZEM CD) 120 MG 24 hr capsule Take 1 capsule (120 mg total) by mouth at bedtime. 02/19/21   Newman Nip, NP  DULoxetine (CYMBALTA) 60 MG capsule Take 1 capsule (60 mg total) by mouth daily. 01/15/21 04/15/21  [place], Alvira Philips, DO  metoprolol succinate (TOPROL-XL) 100 MG 24 hr tablet Take 1 tablet (100 mg total) by mouth daily. Take with or immediately following a meal. 01/15/21 04/15/21  [place], Alvira Philips, DO  Multiple Vitamin (MULTIVITAMIN WITH MINERALS) TABS Take 1 tablet by mouth every morning.     [provider]  naloxone [facility]) nasal spray 4 mg/0.1 mL Place 4 mg into the nose as needed (overdose). 10/06/19   [provider]  pantoprazole (PROTONIX) 40 MG tablet Take 1 tablet (40 mg total) by mouth daily. 01/15/21 01/15/22  [place], Alvira Philips, DO  pregabalin (LYRICA) 50 MG capsule Take 1 capsule (50 mg total) by mouth 3 (three) times daily. 01/15/21 04/15/21  [place], Eric J, DO  SPIRIVA RESPIMAT 1.25 MCG/ACT AERS Inhale 2 puffs into the lungs daily. 01/15/21 04/15/21  [place], Alvira Philips, DO  traZODone (DESYREL) 100 MG tablet Take 1 tablet (100 mg total) by mouth at bedtime as needed for sleep. 01/15/21  [place], Alvira Philips, DO      Allergies    Nortriptyline hcl and Doxycycline    Review of Systems   Review of Systems  Respiratory:  Positive for shortness of breath.   All other systems reviewed and are negative.   Physical Exam Updated Vital Signs BP (!) 117/96   Pulse 99   Temp 97.8 F (36.6 C) (Oral)   Resp 18   Wt 109.8 kg   SpO2 96%   BMI 40.27 kg/m  Physical Exam Vitals and nursing note reviewed.   67 year old female, resting comfortably and in no acute distress. Vital signs are significant for rapid heart rate. Oxygen saturation is 96%,  which is normal. Head is normocephalic and atraumatic. PERRLA, EOMI. Oropharynx is clear. Neck is nontender and supple without adenopathy or JVD. Back is nontender and there is no CVA tenderness. Lungs are clear without rales, wheezes, or rhonchi. Chest is nontender. Heart is tachycardic and irregular without murmur. Abdomen is soft, flat, nontender. Extremities have 1+ edema. Skin is warm and dry without rash. Neurologic: Mental status is normal, cranial nerves are intact, moves all extremities equally.  ED Results / Procedures / Treatments   Labs (all labs ordered are listed, but only abnormal results are displayed) Labs Reviewed  BASIC METABOLIC PANEL - Abnormal; Notable for the following components:      Result Value   Glucose, Bld 158 (*)    BUN 5 (*)    All other components within normal limits  CBC WITH DIFFERENTIAL/PLATELET - Abnormal; Notable for the following components:   WBC 11.6 (*)    RDW 16.6 (*)    Neutro Abs 8.7 (*)    All other components within normal limits  BRAIN NATRIURETIC PEPTIDE - Abnormal; Notable for the following components:   B Natriuretic Peptide 283.0 (*)    All other components within normal limits  SARS CORONAVIRUS 2 BY RT PCR  MAGNESIUM    EKG EKG Interpretation  Date/Time:  Thursday December 03 2022 01:45:14 EDT Ventricular Rate:  147 PR Interval:    QRS Duration: 86 QT Interval:  321 QTC Calculation: 502 R Axis:   67 Text Interpretation: Atrial fibrillation with rapid ventricular response Anteroseptal infarct, age indeterminate Prolonged QT interval When compared with ECG of 02/19/2021, Atrial fibrillation with rapid ventricular response has replaced Sinus rhythm QT has lengthened Confirmed by Dione Booze ([id number]) on 12/03/2022 1:59:28 AM  Radiology DG Chest Port 1 View  Result Date: 12/03/2022 CLINICAL DATA:  Shortness of breath. EXAM: PORTABLE CHEST 1 VIEW COMPARISON:  08/31/2022. FINDINGS: The heart is enlarged and the mediastinal  contour is within normal limits. There is atherosclerotic calcification of the aorta. Mild interstitial prominence and airspace opacities are noted at the lung bases. No effusion or pneumothorax. No acute osseous abnormality. IMPRESSION: 1. Cardiomegaly. 2. Interstitial prominence and airspace opacities at the lung bases, possible edema or infiltrate. Electronically Signed   By: Thornell Sartorius M.D.   On: 12/03/2022 02:26    Procedures Procedures  Cardiac monitor shows atrial fibrillation with rapid ventricular response, per my interpretation.  Medications Ordered in ED Medications  diltiazem (CARDIZEM) 1 mg/mL load via infusion 10 mg (has no administration in time range)    And  diltiazem (CARDIZEM) 125 mg in dextrose 5% 125 mL (1 mg/mL) infusion (has no administration in time range)    ED Course/ Medical Decision Making/ A&P  Medical Decision Making Amount and/or Complexity of Data Reviewed Labs: ordered. Radiology: ordered.  Risk Prescription drug management. Decision regarding hospitalization.   Dyspnea with palpitations and patient with known atrial fibrillation.  Cardiac monitor shows atrial fibrillation with rapid ventricular response.  I have reviewed and interpreted her electrocardiogram and my interpretation is atrial fibrillation with rapid ventricular response, new compared with prior ECG on 02/19/2021.  I reviewed her past records, and she had been admitted to [facility] [place] on 08/31/2022 with similar presentation at which time she was treated with rate control.  She was discharged still in atrial fibrillation and office visit on 09/17/2022 noted still in atrial fibrillation.  Since she does not maintain sinus rhythm, she is not a candidate for cardioversion.  I have ordered diltiazem bolus and infusion as well as CBC and basic metabolic panel and magnesium and brain natruretic protein.  Since symptoms seem clearly related to rapid rate, I have  not ordered troponin.  I have ordered a chest x-ray.  I note echocardiogram on 01/12/2021 showed ejection fraction and normal left ventricular diastolic parameters.  Chest ray shows cardiomegaly with some interstitial prominence and airspace opacities at the bases which could represent edema or infiltrate.  I have independently viewed the image, and agree with radiologist's interpretation.  I have reviewed and interpreted her laboratory tests, and my interpretation is mild leukocytosis which is nonspecific, mildly to moderately elevated BNP consistent with possible heart failure, normal magnesium, elevated random glucose level and otherwise normal metabolic panel, negative PCR for COVID-19.  Following diltiazem, heart rate had come down slightly, but she stated she felt the same.  Her blood pressure was adequate so I ordered an additional dose of diltiazem with further reduction in the heart rate.  I have discussed the case with Dr. Elliot Gurney of family practice service, who agrees to admit the patient.  CRITICAL CARE Performed by: Dione Booze Total critical care time: 80 minutes Critical care time was exclusive of separately billable procedures and treating other patients. Critical care was necessary to treat or prevent imminent or life-threatening deterioration. Critical care was time spent personally by me on the following activities: development of treatment plan with patient and/or surrogate as well as nursing, discussions with consultants, evaluation of patient's response to treatment, examination of patient, obtaining history from patient or surrogate, ordering and performing treatments and interventions, ordering and review of laboratory studies, ordering and review of radiographic studies, pulse oximetry and re-evaluation of patient's condition.  Final Clinical Impression(s) / ED Diagnoses Final diagnoses:  Atrial fibrillation with rapid ventricular response  Shortness of breath  Elevated brain  natriuretic peptide (BNP) level  Elevated random blood glucose level    Rx / DC Orders ED Discharge Orders     None         Dione Booze, MD 12/03/22 [phone]

## 2022-12-03 NOTE — Consult Note (Addendum)
Cardiology Consultation   Autumn [name] ID: DOMENIQUE QUEST MRN: [id number]; DOB: Jun 30, 1956  Admit date: 12/03/2022 Date of Consult: 12/03/2022  PCP:  Julieanne Manson, MD   [facility] Providers Cardiologist:  [facility]   Autumn [name] Profile:   Autumn [name] is a 67 y.o. female with a hx of CAD status post stenting to LAD in 2020, chronic HFrEF, paroxysmal atrial fibrillation, moderate mitral regurgitation, PAD, COPD, Parkinson's, substance use, hypertension who is being seen 12/03/2022 for the evaluation of A-fib RVR at the request of Dr. Jennette Kettle.  History of Present Illness:   Autumn [name] has cardiac history as above.  She is currently being followed by [facility] health and has had 2 hospitalizations this year, both in January, due to complaints of shortness of breath.  One of the hospitalizations she is found to be in A-fib RVR and started on Cardizem drip with improvement in rates and also had an additional admission during the same month for COPD exacerbation.  During her follow-up appointment she was still shown to be in atrial fibrillation with controlled rates.  Per reports Autumn [name] says that she is underwent cardioversion before with return to atrial fibrillation. 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  Per reports, it suggests her recent drug use was likely the cause of her A-fib RVR admission.  Other reports also state she has been resistant to cardioversion. Additionally, per chart review Autumn [name]'s EF was noted to be 40 to 45% with mild global hypokinesis in January 2024.  Also has history of CAD status post stenting to the LAD but has been stable with no complaints of chest pain.  Now Autumn [name] is presenting to the emergency room on 12/03/2022 due to reported recurrent complaints of dyspnea and palpitations.  However at the bedside, Autumn [name] is denying any significant complaints.  She is that she was  forcefully brought here by her granddaughter via EMS. She has stated that she has not been compliant with her medications and is not sure what she takes or why. Autumn [name] admits to meth use once a week and states she uses it to stay awake. During my evaluation Autumn [name] is very somnolent with her eyes closed during most of the encounter. Autumn [name] was not very interactive during my visit and stated she wanted to leave. Currently denying any chest pain, shortness of breath, palpitations.   Hospital course: EKG shows A-fib with RVR heart rate 147, prolonged QTc of 502. She has been given 2 boluses of diltiazem and then placed on a drip with improvement some improvement in rates now 120-130.  However this has been discontinued due to soft blood pressures.  Chest x-ray indicating interstitial prominence and airspace opacities that could be interpreted as edema or infiltrates. BNP 283.  Has history of COPD and is usually on 2 L via nasal cannula however is currently on 6 L while in the emergency room.  UDS positive for amphetamines and THC. Also, pt sustained a fall. Neg CT.  Past Medical History:  Diagnosis Date   Anxiety    Arthritis    Attention deficit disorder (ADD)    COPD (chronic obstructive pulmonary disease)    Depression    Drug abuse    Fibromyalgia    HFrEF (heart failure with reduced ejection fraction)    Hyperlipidemia    Paroxysmal Atrial fibrillation     Past Surgical History:  Procedure Laterality Date   ANKLE SURGERY  FACIAL COSMETIC SURGERY        Inpatient Medications: Scheduled Meds:  apixaban  5 mg Oral BID   atorvastatin  40 mg Oral Daily   buprenorphine-naloxone  1 tablet Sublingual TID   carbidopa-levodopa  1 tablet Oral TID   metoprolol succinate  50 mg Oral QHS   metoprolol tartrate  25 mg Oral Once   umeclidinium bromide  1 puff Inhalation Daily   Continuous Infusions:  diltiazem (CARDIZEM) infusion Stopped (12/03/22 1302)   PRN Meds: albuterol,  ipratropium-albuterol  Allergies:    Allergies  Allergen Reactions   Nortriptyline Hcl Anaphylaxis   Doxycycline Other (See Comments)    Social History:   Social History   Socioeconomic History   Marital status: Divorced    Spouse name: Not on file   Number of children: Not on file   Years of education: Not on file   Highest education level: Not on file  Occupational History   Occupation: disabled  Tobacco Use   Smoking status: Every Day    Packs/day: .5    Types: Cigarettes   Smokeless tobacco: Never   Tobacco comments:    Half pack daily going to try patches  Vaping Use   Vaping Use: Every day  Substance and Sexual Activity   Alcohol use: Not Currently    Comment: A fifth of vodka a day - none in a year   Drug use: Yes    Types: Cocaine, Marijuana    Comment: uses marijuana regularly; used cocaine " a long time ago"   Sexual activity: Not on file  Other Topics Concern   Not on file  Social History Narrative   Not on file   Social Determinants of Health   Financial Resource Strain: Not on file  Food Insecurity: Not on file  Transportation Needs: Not on file  Physical Activity: Not on file  Stress: Not on file  Social Connections: Not on file  Intimate Partner Violence: Not on file    Family History:   History reviewed. No pertinent family history.   ROS:  Please see the history of present illness.  All other ROS reviewed and negative.     Physical Exam/Data:   Vitals:   12/03/22 1445 12/03/22 1500 12/03/22 1517 12/03/22 1542  BP: 100/63 124/79  127/68  Pulse: (!) 108 (!) 114  (!) 113  Resp: 15 18  18   Temp:   98.1 F (36.7 C) 98.6 F (37 C)  TempSrc:   Oral Oral  SpO2: 97% 96%  97%  Weight:        Intake/Output Summary (Last 24 hours) at 12/03/2022 1646 Last data filed at 12/03/2022 1357 Gross per 24 hour  Intake 138.28 ml  Output 550 ml  Net -411.72 ml      12/03/2022    1:44 AM 02/19/2021    9:36 AM 02/05/2021    9:35 AM  Last 3  Weights  Weight (lbs) 242 lb 238 lb 12.8 oz 234 lb  Weight (kg) 109.77 kg 108.319 kg 106.142 kg     Body mass index is 40.27 kg/m.  General:  Well nourished, well developed, in no acute distress HEENT: normal Neck: no JVD Vascular: No carotid bruits; Distal pulses 2+ bilaterally Cardiac: Irregularly irregular Lungs:  slight wheezes in the bases Abd: soft, nontender, no hepatomegaly  Ext: no edema Musculoskeletal: Mild edema in bilateral lower extremities Skin: warm and dry  Neuro:  CNs 2-12 intact, no focal abnormalities noted Psych:  Normal affect  EKG:  The EKG was personally reviewed and demonstrates:  A-fib with RVR heart rate 147, prolonged QTc of 502.  Telemetry:  Telemetry was personally reviewed and demonstrates:  Afib heart rate 110s to 130s  Relevant CV Studies: Echocardiogram 09/01/2022 Per chart review LVEF shown to be 40 to 45%, moderate mitral regurgitation, mild pulmonary hypertension   Laboratory Data:  High Sensitivity Troponin:   Recent Labs  Lab 12/03/22 1438  TROPONINIHS 18*     Chemistry Recent Labs  Lab 12/03/22 0151  NA 138  K 3.5  CL 103  CO2 25  GLUCOSE 158*  BUN 5*  CREATININE 0.85  CALCIUM 8.9  MG 1.9  GFRNONAA >60  ANIONGAP 10    No results for input(s): "PROT", "ALBUMIN", "AST", "ALT", "ALKPHOS", "BILITOT" in the last 168 hours. Lipids No results for input(s): "CHOL", "TRIG", "HDL", "LABVLDL", "LDLCALC", "CHOLHDL" in the last 168 hours.  Hematology Recent Labs  Lab 12/03/22 0151  WBC 11.6*  RBC 4.51  HGB 12.6  HCT 38.7  MCV 85.8  MCH 27.9  MCHC 32.6  RDW 16.6*  PLT 249   Thyroid  Recent Labs  Lab 12/03/22 1141  TSH 0.846    BNP Recent Labs  Lab 12/03/22 0151  BNP 283.0*    DDimer No results for input(s): "DDIMER" in the last 168 hours.   Radiology/Studies:  ECHOCARDIOGRAM COMPLETE  Result Date: 12/03/2022    ECHOCARDIOGRAM REPORT   Autumn [name] Name:   YUNA PIZZOLATO Moffa Date of Exam: 12/03/2022 Medical Rec #:   [id number]       Height:       65.0 in Accession #:    [id number]      Weight:       242.0 lb Date of Birth:  09-May-1956       BSA:          2.145 m Autumn [name] Age:    66 years        BP:           105/74 mmHg Autumn [name] Gender: F               HR:           126 bpm. Exam Location:  Inpatient Procedure: 2D Echo, Color Doppler and Cardiac Doppler Indications:    CHF  History:        Autumn [name] has prior history of Echocardiogram examinations, most                 recent 01/12/2021. CHF; Arrythmias:Atrial Fibrillation.  Sonographer:    Milbert Coulter Referring Phys: [id number] DAVID GLICK IMPRESSIONS  1. Left ventricular ejection fraction, by estimation, is 40 to 45% in the setting of rapid atrial fbrillation with heart rate 125-130bpm.. The left ventricle has mildly decreased function. The left ventricle demonstrates global hypokinesis. The left ventricular internal cavity size was mildly dilated. Left ventricular diastolic function could not be evaluated.  2. Right ventricular systolic function is normal. The right ventricular size is normal.  3. Right atrial size was mildly dilated.  4. The mitral valve is degenerative. Mild mitral valve regurgitation. No evidence of mitral stenosis. Moderate mitral annular calcification.  5. The aortic valve is tricuspid. Aortic valve regurgitation is trivial. Aortic valve sclerosis/calcification is present, without any evidence of aortic stenosis.  6. The inferior vena cava is normal in size with greater than 50% respiratory variability, suggesting right atrial pressure of 3 mmHg.  7. Recommend repeat limited echo once heart rate controlled to more  accurately assess LVF. FINDINGS  Left Ventricle: Left ventricular ejection fraction, by estimation, is 40 to 45%. The left ventricle has mildly decreased function. The left ventricle demonstrates global hypokinesis. The left ventricular internal cavity size was mildly dilated. There is  no left ventricular hypertrophy. Left ventricular diastolic function  could not be evaluated due to atrial fibrillation. Left ventricular diastolic function could not be evaluated. Right Ventricle: The right ventricular size is normal. No increase in right ventricular wall thickness. Right ventricular systolic function is normal. Left Atrium: Left atrial size was normal in size. Right Atrium: Right atrial size was mildly dilated. Pericardium: There is no evidence of pericardial effusion. Mitral Valve: The mitral valve is degenerative in appearance. Moderate mitral annular calcification. Mild mitral valve regurgitation. No evidence of mitral valve stenosis. Tricuspid Valve: The tricuspid valve is normal in structure. Tricuspid valve regurgitation is trivial. No evidence of tricuspid stenosis. Aortic Valve: The aortic valve is tricuspid. Aortic valve regurgitation is trivial. Aortic valve sclerosis/calcification is present, without any evidence of aortic stenosis. Pulmonic Valve: The pulmonic valve was normal in structure. Pulmonic valve regurgitation is not visualized. No evidence of pulmonic stenosis. Aorta: The aortic root is normal in size and structure. Venous: The inferior vena cava is normal in size with greater than 50% respiratory variability, suggesting right atrial pressure of 3 mmHg. IAS/Shunts: No atrial level shunt detected by color flow Doppler.  LEFT VENTRICLE PLAX 2D LVIDd:         5.70 cm LVIDs:         3.70 cm LV PW:         1.20 cm LV IVS:        1.10 cm LVOT diam:     2.00 cm LVOT Area:     3.14 cm  LV Volumes (MOD) LV vol d, MOD A2C: 64.3 ml LV vol d, MOD A4C: 80.1 ml LV vol s, MOD A2C: 37.0 ml LV vol s, MOD A4C: 53.7 ml LV SV MOD A2C:     27.3 ml LV SV MOD A4C:     80.1 ml LV SV MOD BP:      25.3 ml RIGHT VENTRICLE RV Basal diam:  2.90 cm RV Mid diam:    1.90 cm LEFT ATRIUM             Index        RIGHT ATRIUM           Index LA diam:        4.30 cm 2.00 cm/m   RA Area:     22.10 cm LA Vol (A2C):   74.5 ml 34.73 ml/m  RA Volume:   63.30 ml  29.51 ml/m LA Vol  (A4C):   44.2 ml 20.61 ml/m LA Biplane Vol: 60.4 ml 28.16 ml/m   SHUNTS Systemic Diam: 2.00 cm Armanda Magic MD Electronically signed by Armanda Magic MD Signature Date/Time: 12/03/2022/2:05:47 PM    Final    CT HEAD WO CONTRAST ( )  Result Date: 12/03/2022 CLINICAL DATA:  Minor head trauma. EXAM: CT HEAD WITHOUT CONTRAST TECHNIQUE: Contiguous axial images were obtained from the base of the skull through the vertex without intravenous contrast. RADIATION DOSE REDUCTION: This exam was performed according to the departmental dose-optimization program which includes automated exposure control, adjustment of the mA and/or kV according to Autumn [name] size and/or use of iterative reconstruction technique. COMPARISON:  No comparison studies available. FINDINGS: Brain: There is no evidence for acute hemorrhage, hydrocephalus, mass lesion, or abnormal extra-axial fluid  collection. No definite CT evidence for acute infarction. Diffuse loss of parenchymal volume is consistent with atrophy. Patchy low attenuation in the deep hemispheric and periventricular white matter is nonspecific, but likely reflects chronic microvascular ischemic demyelination. Vascular: No hyperdense vessel or unexpected calcification. Skull: No evidence for fracture. No worrisome lytic or sclerotic lesion. Sinuses/Orbits: The visualized paranasal sinuses and mastoid air cells are clear. Visualized portions of the globes and intraorbital fat are unremarkable. Other: None. IMPRESSION: 1. No acute intracranial abnormality. 2. Atrophy with chronic small vessel ischemic disease. Electronically Signed   By: Kennith Center M.D.   On: 12/03/2022 07:48   DG Chest Port 1 View  Result Date: 12/03/2022 CLINICAL DATA:  Shortness of breath. EXAM: PORTABLE CHEST 1 VIEW COMPARISON:  08/31/2022. FINDINGS: The heart is enlarged and the mediastinal contour is within normal limits. There is atherosclerotic calcification of the aorta. Mild interstitial prominence and  airspace opacities are noted at the lung bases. No effusion or pneumothorax. No acute osseous abnormality. IMPRESSION: 1. Cardiomegaly. 2. Interstitial prominence and airspace opacities at the lung bases, possible edema or infiltrate. Electronically Signed   By: Thornell Sartorius M.D.   On: 12/03/2022 02:26     Assessment and Plan:   Paroxysmal atrial fibrillation with RVR in the setting of current methamphetamine use  Autumn [name] presenting with reported complaints of shortness of breath and heart palpitations and recurrent hospital admissions for same issue.  However, during my evaluation Autumn [name] states she is asymptomatic and would like to leave.  Heart rates have been fluctuating around 120.  Autumn [name] initially started on diltiazem, this has been stopped due to soft blood pressures and additionally is not a great option considering her reported reduced EF of 40 to 45%.  Autumn [name] also reports some noncompliance with her medications. Will give one time dose of Lopressor 25mg  now and then start Toprol XL 50mg  daily starting tonight. Defer digoxin at this time.  Do not think amiodarone would be a good option considering prolonged QTc of 502.  Cardioversion also not a great option due to concomitant drug use and concerns of noncompliance, and previous resistance.  Continue home Eliquis 5 mg twice daily  Chronic HFrEF Mild mitral regurgitation Per chart review LVEF in January 2024 was noted to be 40 to 45% with mild global hypokinesis, moderate mitral regurgitation, mild pulmonary hypertension.  Echocardiogram on this admission still showing 40 to 45% in the setting of RVR.  Chest ray indicating cardiomegaly and interstitial prominence and airspace opacities indicating possible edema or infiltrates.  Additionally Autumn [name] has some BLE edema, however lung sounds clear and not overtly volume overloaded.  BNP 283. Can consider oral Lasix 40 mg daily after rate control and as long as she o2 sats are stable.  PTA  Toprol XL 100 mg daily Will titrate GDMT once rate controlled and with more BP room Avoid Diltiazem due to reduced EF.   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  Repeat EKG at some point during this admission.  CAD status post stent to LAD 2020 Appears to be stable.  Denies any chest pain  Hypertension Blood pressures are soft.  Hyperlipidemia Continue atorvastatin 40 mg daily  Substance use UDS showing + for meth and THC. Pt reports usingg methamphetamine once weekly to help keep her awake.  Counseled on dangers and use of drugs.  Autumn [name] does not seem to want to quit. On suboxone.   OSA Autumn [name] very somnolent during exam and refuses  BiPAP. Counseled on risks.   Parkinson COPD Fall Managed per primary team Risk Assessment/Risk Scores:   CHA2DS2-VASc Score = 4  CHF History: 1 HTN History: 1 Diabetes History: 0 Stroke History: 0 Vascular Disease History: 0 Age Score: 1 Gender Score: 1  For questions or updates, please contact [facility] Please consult [email or web address] for contact info under   Signed, Abagail Kitchens, PA-C  12/03/2022 4:46 PM   Autumn [name] seen and examined, note reviewed with the signed Advanced Practice Provider. I personally reviewed laboratory data, imaging studies and relevant notes. I independently examined the Autumn [name] and formulated the important aspects of the plan. I have personally discussed the plan with the Autumn [name] and/or family. Comments or changes to the note/plan are indicated below.  Autumn [name] seen and examined at her bedside.  No complaints at this time.  Tele with evidence of Afib rvr.   GEN:  Well nourished, well developed in no acute distress HEENT: Mucous membranes moist, good dentition NECK: No JVD; No carotid bruits LYMPHATICS: No lymphadenopathy CARDIAC: S1S2 noted, RRR, no murmurs, rubs, gallops RESPIRATORY:  Clear to auscultation without rales, wheezing or rhonchi   ABDOMEN: Soft, non-tender, non-distended, bowel sounds noted, no guarding EXTREMITIES: trace bilateral edema, No cyanosis, no cyanosis, no clubbing MUSCULOSKELETAL: No deformity  SKIN: Warm and dry NEUROLOGIC:  Alert and oriented x 3, nonfocal PSYCHIATRIC:  Normal affect, good insight  PAF Heart failure with reduced ejection fraction  Mild mitral regurgitation  Prolonged Qtc OSA  Please restart her metoprolol will start with one time lopressor now and then start Toprol Xl 50mg  tonight.  EF 40-45%, starting toprol, blood pressure on the lower side will monitor. May benefit from optimizing GDMT once bp can tolerate.   Repeat ECG in the am.   Bipap per primary team.     Thomasene Ripple DO, MS [facility] Attending Cardiologist [facility]  [address] [place], [place] [address] [phone] Website: [email or web address]

## 2022-12-03 NOTE — Assessment & Plan Note (Addendum)
UDS positive for amphetamines and cannabis.  History of opioid use.  On sublingual Suboxone 8-2 mg TID. -Continue home Suboxone. -TOC consulted for substance abuse counseling

## 2022-12-03 NOTE — ED Notes (Signed)
Dr. Glick at bedside.  

## 2022-12-03 NOTE — ED Notes (Signed)
Spoke with family medicine.  They affirmed keeping dilt at 12 mg until Cards weighs in

## 2022-12-03 NOTE — H&P (Addendum)
Hospital Admission History and Physical Service Pager: [phone]  Patient name: Autumn [name] Medical record number: [id number] Date of Birth: 02-18-1956 Age: 67 y.o. Gender: female  Primary Care Provider: Julieanne Manson, MD Consultants: None Code Status: Full Preferred Emergency Contact:  Contact Information     Name Relation Home Work [place] Daughter [phone]     Lyndal Pulley [phone]          Chief Complaint: SOB  [name] is a 67 y.o. female presenting with A-fib with RVR.  Unclear as to etiology of her A-fib with RVR other possible causes of AFIB  include thyroid disease, heart failure, cocaine use or infectious process.   * Atrial fibrillation with RVR In A-fib with RVR  on admission with HR up to 160s. Started on IV Dilts gtt in the ED with improved heart rate down to 120-130s. Suspect likely persistent AFIB given initial presentation was within the last year and per her chart has remained in AFIB but rate controlled.  Doubt she is a good candidate for cardioversion given failed attempt in the past as reported by patient.  Home medication include metoprolol succinate 100 mg daily and diltiazem 120 mg daily with Eliquis 5 mg twice daily. -Admitted to family medicine service, Dr. Jennette Kettle attending -Continuous cardiac monitoring -Monitor daily BMP -S/p bolus Dilt x2 (in ED) -Continue IV Dilts gtt. -Continue anticoagulation with home Eliquis -Consider cardiology consulted.  Parkinson's disease Diagnoses couple of months ago and follows with neurologist out patient. Was started on Sinemet. -Continue Sinemet once med rec completed.  Congestive heart failure (CHF) Patient unaware of CHF diagnosis. Chart review shows a history of HFpEF with most recent EF of 40-45% (Jan, 2024) with mild global hypokinesis severe of LV (per care everywhere note). Echo result from 2 years ago showed EF of 60-65%. Exam appears to  have +1 BLE edema with signs of Cardiomegaly on CXR and BNP of 283 on admission. Not on home diuretics as reported by patient -Continuous cardiac monitoring -Obtain Echo -Daily weights -Strict ins and outs -Daily BMP -Will consult cardiology, appreciate recs  Acute hypoxic respiratory failure On exam have intermittent bibasilar wheezing which could be a chronic finding in known COPD patient who continue to use tobacco.  Does not appear to be in COPD exacerbation.  Usually on 2 [place] at night at baseline but was increased to 6L [place] while in the ED which I suspect was titrated with presumed O2 goal of >94%, now on 2L [place]. -Provide supplementally oxygen as needed, O2 sats goal 88-92% -Will continue management with Bronchodilator -Discussed importance of smoking cessation  Polysubstance abuse History of opioid use.  On sublingual Suboxone 8-2 mg TID. -Continue home Suboxone.   Fall Patient had a reported fall without LOC a little over a week and denies any trauma to the head.  Will obtain head CT given patient on anticoagulant.  On exam have ecchymosis inferior to the right knee. -Head CT WO Contrast ordered -Fall precaution.  FEN/GI: Regular diet  VTE Prophylaxis: Home Eliquis   Disposition: Med-Tele  History of Present Illness:  Autumn [name] is a 67 y.o. female presenting with palpitations that started around midnight today.  Patient reports feeling palpitations earlier this morning similar to how she felt back in January when she was admitted for A-fib with RVR.  She denies any chest pains or shortness of breath with symptoms.  Patient stated admission back in January for  A-fib RVR wasn't her first time in A-fib.  She reported initial presentation with A-fib was about a year ago.  She initially was able to convert to sinus rhythm however has had multiple admissions for A-fib since then that has been rate controlled but remained in A-fib.  Patient reports attempted cardioversion in the  past that was unsuccessful.  Of note was previously admitted back in August 31, 2022 for similar presentation with dyspnea and found to be in A-fib with RVR.  Was treated with IV diltiazem. Doesn't look like cardioversion was attempted.  Appeared to be rate control but remain in A-fib and discharged on metoprolol 100 mg daily and daily Eliquis.  In addition patient reported a recent mechanical fall about 2 weeks ago in which she fell on her knees. Denies any head trauma or LOC. She reports noticing mild BLE swelling that started about a week ago. She is unaware of any heart failure diagnosis and not on any diuretic medications.  History was limited due to patient's sleepiness.   In the ED, patient was noted to be hypoxic to 89% on RA and placed on 6L [employer] with improved O2 sats to 100%.  She was noted to be in A-fib with RVR and started on IV Cardizem gtt.  Review Of Systems: Per HPI  Pertinent Past Medical History: CAD with stent in 2020 (Unclear but seen on chart) HTN  A-fib HLD Parkinson's Systolic heart failure COPD Hx of tobacco use Hx of substance use disorder GERD  Pertinent Past Surgical History: Right ankle fracture surgical repair Ectopic pregnancy surgery Remainder reviewed in history tab.  Pertinent Social History: Tobacco use: Yes/No/Former Alcohol use: Not currently Other Substance use: Marijuana (Previously use cocaine, heroin)   Pertinent Family History: Remainder reviewed in history tab.   Important Outpatient Medications: (Needs med rec with pharmacy) Albuterol 2.5 mg every 4 hours as needed Eliquis 5 mg daily Abilify 10 mg daily Atorvastatin 40 mg daily Suboxone 8-2 mg daily Flexeril 10 mg 3 times daily as needed Diltiazem 120 mg daily Duloxetine 60 mg daily Metoprolol succinate 100 mg daily Protonix 40 mg daily Lyrica 50 mg 3 times daily Spiriva 2 puff daily Trazodone 100 mg daily for sleep  Objective: BP 107/85   Pulse (!) 104   Temp 97.8 F  (36.6 C) (Oral)   Resp 19   Wt 109.8 kg   SpO2 94%   BMI 40.27 kg/m  Exam: General: Somnolent but easily arousable, morbidly obese, NAD CV:  Irregularly irregular, no murmurs, normal S1/S2 Pulm: CTAB with intermittent bibasilar wheezing, good WOB on 2L [place], Abd: Soft, no distension, no tenderness Skin: Ecchymosis on the RLE inferior to the knee Ext: +1 BLE Edema up to the calf Neuro: CN II-XII intact, No focal deficit    Labs:  CBC BMET  Recent Labs  Lab 12/03/22 0151  WBC 11.6*  HGB 12.6  HCT 38.7  PLT 249   Recent Labs  Lab 12/03/22 0151  NA 138  K 3.5  CL 103  CO2 25  BUN 5*  CREATININE 0.85  GLUCOSE 158*  CALCIUM 8.9    Pertinent additional labs BNP 283, magnesium 1.9.   EKG: My own interpretation: A-fib with RVR.   Imaging Studies Performed: Imaging Study: Chest x-ray My Interpretation: Cardiomegaly with airspace opacity.   Jerre Simon, MD 12/03/2022, 6:29 AM PGY-2, [facility]  FPTS Intern pager: [phone], text pages welcome Secure chat group [facility] Teaching Service

## 2022-12-03 NOTE — Assessment & Plan Note (Addendum)
Initially in a fib with RVR in the setting of recent stimulant use.  UDS positive for amphetamines and cannabis. Pt still in a-fib w/ HR in low 100-1-teens over night on Torprol XL 50 mg. TSH WNL.  Per cards she is not a candidate for amiodarone given her prolonged QTc and not candidate for cardioversion due to substance use and medication noncompliance. -Cardiology on board, appreciate recs -Increase metoprolol succinate to 100 mg nightly per cardiology -Continue home Eliquis 5 mg twice daily

## 2022-12-03 NOTE — ED Notes (Signed)
Cardizem drip paused at this time. MD notified. HR maintains at 120bpm. Pt remains alert and oriented x 4.

## 2022-12-03 NOTE — ED Notes (Signed)
Pt stood up independently to bedside commode to void

## 2022-12-03 NOTE — Assessment & Plan Note (Addendum)
She thinks she is supposed be on oxygen at home but has not been due to living situation.  She is currently breathing comfortably on room air.  Has not been using any of the PRNs including albuterol and DuoNebs however she did not know she could request them.  She is not short of breath but currently has productive cough, remains afebrile. CXR yesterday stable from prior with some haziness of lung bases.  Will start treatment for COPD exacerbation with azithromycin in setting of productive cough. Will not treat with prednisone at this time given tenuous volume status in setting of CHF.  -O2 sat goal 88-92% -Continue Anoro Ellipta inhaler and budesonide nebulizer -DuoNebs every 4 hours as needed -Azithromycin 500 mg daily(day 1/3) -Continue Tessalon Perles and Robitussin as needed per patient request -Encourage smoking cessation

## 2022-12-03 NOTE — Evaluation (Signed)
Physical Therapy Evaluation Patient Details Name: Autumn [name] MRN: [id number] DOB: Aug 25, 1955 Today's Date: 12/03/2022  History of Present Illness  Patient is 67 y.o. female presenting with palpitations and found to be in A-fib with RVR.  Unclear as to etiology at this time. In addition patient reports a recent mechanical fall about 2 weeks ago in which she fell on her knees. Denies any head trauma or LOC. She reports noticing mild BLE swelling that started about a week ago. PMH significant for Parkinson's disease, CHF, COPD, HLD, fibromyalgia, OA, ADD, anxiety, depression, hx of polysubstance abuse on suboxone.   Clinical Impression  Autumn [name] is 67 y.o. female admitted with above HPI and diagnosis. Patient is currently limited by functional impairments below (see PT problem list). Patient lives with grandchildren and reports independence at baseline. Session was limited by pt's BP/HR, pt in Afib and uncontrolled, Cardiology PA present for portion of session and no limitations with mobilizing. Pt completed supine>sit>stand, BP stable and HR elevated to max of 134 bpm. Pt denied symptoms. Pt expresses feeling well and that she wishes to go home. Listens to Physician education on cardiac function and impact of drug use, listens to PT's education on benefits of mobility, pt does not show evidence of learning or interest in lifestyle changes. Patient will benefit from continued skilled PT interventions to address impairments and progress independence with mobility during acute stay. Acute PT will follow and progress as able.     Orthostatic VS for the past 24 hrs:  BP- Lying Pulse- Lying BP- Sitting Pulse- Sitting BP- Standing at 0 minutes Pulse- Standing at 0 minutes  12/03/22 1458 100/63 120 100/71 124 (!) 111/99 134         Recommendations for follow up therapy are one component of a multi-disciplinary discharge planning process, led by the attending physician.  Recommendations may be  updated based on patient status, additional functional criteria and insurance authorization.  Follow Up Recommendations       Assistance Recommended at Discharge Intermittent Supervision/Assistance  Patient can return home with the following       Equipment Recommendations None recommended by PT  Recommendations for Other Services       Functional Status Assessment Patient has had a recent decline in their functional status and demonstrates the ability to make significant improvements in function in a reasonable and predictable amount of time.     Precautions / Restrictions Precautions Precautions: Fall Precaution Comments: watch BP and HR Restrictions Weight Bearing Restrictions: No      Mobility  Bed Mobility Overal bed mobility: Modified Independent                  Transfers Overall transfer level: Needs assistance Equipment used: None Transfers: Sit to/from Stand Sit to Stand: Min guard           General transfer comment: guarding for safety due to high trauma stretcher height. pt able to stand from edge and sit and scoot hips posteriorly onto bed.    Ambulation/Gait               General Gait Details: deferred due to tachycardia  Stairs            Wheelchair Mobility    Modified Rankin (Stroke Patients Only)       Balance Overall balance assessment: Needs assistance Sitting-balance support: Feet supported Sitting balance-Leahy Scale: Good     Standing balance support: No upper extremity supported Standing balance-Leahy Scale: Fair  Pertinent Vitals/Pain Pain Assessment Pain Assessment: No/denies pain    Home Living Family/patient expects to be discharged to:: Unsure                   Additional Comments: pt reports she is homeless and staying with her grandson at present    Prior Function Prior Level of Function : Independent/Modified Independent              Mobility Comments: using rollator for mobility       Hand Dominance   Dominant Hand: Right    Extremity/Trunk Assessment   Upper Extremity Assessment Upper Extremity Assessment: Overall WFL for tasks assessed    Lower Extremity Assessment Lower Extremity Assessment: Overall WFL for tasks assessed    Cervical / Trunk Assessment Cervical / Trunk Assessment: Normal;Other exceptions Cervical / Trunk Exceptions: habitus  Communication   Communication: No difficulties  Cognition Arousal/Alertness: Awake/alert Behavior During Therapy: WFL for tasks assessed/performed Overall Cognitive Status: Within Functional Limits for tasks assessed                                          General Comments      Exercises     Assessment/Plan    PT Assessment Patient needs continued PT services  PT Problem List Decreased activity tolerance;Decreased balance;Decreased mobility;Decreased cognition;Decreased safety awareness;Decreased knowledge of precautions;Cardiopulmonary status limiting activity;Obesity;Decreased skin integrity       PT Treatment Interventions DME instruction;Gait training;Stair training;Functional mobility training;Therapeutic activities;Therapeutic exercise;Balance training;Cognitive remediation;Patient/family education    PT Goals (Current goals can be found in the Care Plan section)  Acute Rehab PT Goals Patient Stated Goal: go home PT Goal Formulation: With patient Time For Goal Achievement: 12/17/22 Potential to Achieve Goals: Good    Frequency Min 1X/week     Co-evaluation               AM-PAC PT "6 Clicks" Mobility  Outcome Measure Help needed turning from your back to your side while in a flat bed without using bedrails?: None Help needed moving from lying on your back to sitting on the side of a flat bed without using bedrails?: None Help needed moving to and from a bed to a chair (including a wheelchair)?: A Little Help  needed standing up from a chair using your arms (e.g., wheelchair or bedside chair)?: None Help needed to walk in hospital room?: A Little Help needed climbing 3-5 steps with a railing? : A Lot 6 Click Score: 20    End of Session Equipment Utilized During Treatment: Oxygen Activity Tolerance: Patient tolerated treatment well Patient left: in bed;with call bell/phone within reach Nurse Communication: Mobility status PT Visit Diagnosis: Unsteadiness on feet (R26.81);Muscle weakness (generalized) (M62.81);Difficulty in walking, not elsewhere classified (R26.2)    Time: 1436-1511 (14 minutes unbillable due to Cardiology in room) PT Time Calculation (min) (ACUTE ONLY): 35 min   Charges:   PT Evaluation $PT Eval Moderate Complexity: 1 Mod         Wynn Maudlin, DPT Acute Rehabilitation Services Office [phone]  12/03/22 3:19 PM

## 2022-12-03 NOTE — ED Notes (Signed)
Pt denies chest pain, sob or any other discomfort. Admit team aware that cardizem drip is paused at this time. HR ranging from 112-120 bpm. Will notify MD if anything changes. Cardiac monitoring in place.

## 2022-12-03 NOTE — Assessment & Plan Note (Addendum)
TTE showing LVEF 40-45%, unchanged from prior in Jan 2024. CXR yesterday showing decreasing vascular congestion. UOP 2.361 L overnight. Cardiology recommend avoiding diltiazem in setting of reduced EF.  - Cardiology following, appreciate recs - Continue Toprol-XL 100 mg daily - Losartan 25 mg daily starting today per cardiology - Daily I/Os - Daily weights

## 2022-12-03 NOTE — Progress Notes (Signed)
Daily Progress Note Intern Pager: [phone]  Patient name: Autumn [name] Medical record number: [id number] Date of birth: 03/17/1956 Age: 67 y.o. Gender: female  Primary Care Provider: Julieanne Manson, MD Consultants: Cardiology Code Status: FULL  Pt Overview and Major Events to Date:  4/25 - Admission 4/25 - Consult to Cardiology  Assessment and Plan: Autumn [name] is a 67 y.o. female presenting with A-fib with RVR.  Unclear as to etiology of her A-fib with RVR other possible causes of AFIB  include thyroid disease, heart failure, cocaine use or infectious process.  * Atrial fibrillation with rapid ventricular response Diltiazem drip discontinued given hypotension and tachcyardia.  Notified by RN patient was having soft pressures and tachycardic, was evaluated and confirmed findings although MAPs maintained above 68.  Cardiology consulted for management of her A-fib, she will likely require amiodarone.  Home medications include metoprolol succinate 100 mg daily, diltiazem 120 mg daily, Eliquis 5 mg twice daily.  UDS + for amphetamines and cannabis.  -Continuous cardiac monitoring -Consult to cardiology, appreciate recommendations -Eliquis 5 mg twice daily -Lipitor 40 mg daily -Monitor daily BMP -TSH pending   Parkinson's disease Diagnoses couple of months ago and follows with neurologist out patient. Was started on Sinemet. -Continue Sinemet once med rec completed.  Congestive heart failure (CHF) Patient unaware of CHF diagnosis. Chart review shows a history of HFpEF with most recent EF of 40-45% (Jan, 2024) with mild global hypokinesis severe of LV (per care everywhere note). Echo result from 2 years ago showed EF of 60-65%. Exam appears to have +2 BLE edema with signs of Cardiomegaly on CXR and BNP of 283 on admission. Not on home diuretics as reported by patient -Continuous cardiac monitoring -F/u Echo -Daily weights -Strict ins and outs -Daily BMP -Will consult  cardiology, appreciate recs  Acute hypoxic respiratory failure Per chart review no previous diagnosis of COPD on chart review.  Satting well currently on 2.5 L [place].  She reports she is not regularly on home oxygen, does not tolerate CPAP.  No emphysematous changes on CXR. -O2 sat goal 88-2% -Continue management with bronchodilator -Discussed importance of smoking cessation  Polysubstance abuse History of opioid use.  On sublingual Suboxone 8-2 mg TID. -Continue home Suboxone.   FEN/GI: Regular diet  VTE Prophylaxis: Home Eliquis  Dispo:Home pending clinical improvement .  Subjective:  Patient at bedside, on 2.5 L [place].  Was paged by RN this morning regarding vitals, patient is currently on amiodarone drip for her A-fib and has been dropping to 80s-90s/40s-50s.  Patient appears asymptomatic this morning, denies any chest pain or shortness of breath.  She reports she feels better compared to earlier in her admission.  Cardiology was consulted and while in the room the patient's maps remained stable ranging in 60-65.  Objective: Temp:  [97.8 F (36.6 C)-98 F (36.7 C)] 98 F (36.7 C) (04/25 1335) Pulse Rate:  [31-140] 111 (04/25 1315) Resp:  [15-36] 15 (04/25 1315) BP: (71-123)/(47-96) 107/68 (04/25 1315) SpO2:  [88 %-98 %] 92 % (04/25 1315) Weight:  [109.8 kg] 109.8 kg (04/25 0144) Physical Exam: General: Chronically ill-appearing woman, body habitus obese, NAD Cardiovascular: Irregularly irregular, no M/R/G appreciated Respiratory: Normal work of breathing on 2.5 L [place], clear lung sounds anteriorly Abdomen: Distended, not out of proportion to body habitus, nontender Extremities: +2 pitting edema of BLE   Laboratory: Most recent CBC Lab Results  Component Value Date   WBC 11.6 (H) 12/03/2022   HGB 12.6  12/03/2022   HCT 38.7 12/03/2022   MCV 85.8 12/03/2022   PLT 249 12/03/2022   Most recent BMP    Latest Ref Rng & Units 12/03/2022    1:51 AM  BMP  Glucose 70 - 99 mg/dL  528   BUN 8 - 23 mg/dL 5   Creatinine 4.13 - 2.44 mg/dL 0.10   Sodium 272 - 536 mmol/L 138   Potassium 3.5 - 5.1 mmol/L 3.5   Chloride 98 - 111 mmol/L 103   CO2 22 - 32 mmol/L 25   Calcium 8.9 - 10.3 mg/dL 8.9    Other pertinent labs: HIV and TSH pending   Imaging Studies Performed: Imaging Study: Chest x-ray My Interpretation: Cardiomegaly with airspace opacity.   Lorri Frederick, MD 12/03/2022, 1:52 PM  PGY-1, [facility] FPTS Intern pager: [phone], text pages welcome Secure chat group [facility] Teaching Service

## 2022-12-03 NOTE — Assessment & Plan Note (Addendum)
Diagnoses couple of months ago and follows with neurologist out patient. Was started on Sinemet. -Continue Sinemet once med rec completed.

## 2022-12-03 NOTE — Progress Notes (Signed)
MEWS Progress Note  Patient Details Name: Autumn [name] MRN: [id number] DOB: [birth year] Today's Date: 12/03/2022   MEWS Flowsheet Documentation:  Assess: MEWS Score Temp: 98.6 F (37 C) BP: 127/68 MAP (mmHg): 81 Pulse Rate: (!) 113 ECG Heart Rate: (!) 111 Resp: 18 Level of Consciousness: Alert SpO2: 97 % O2 Device: Nasal Cannula O2 Flow Rate (L/min): 2 L/min Assess: MEWS Score MEWS Temp: 0 MEWS Systolic: 0 MEWS Pulse: 2 MEWS RR: 0 MEWS LOC: 0 MEWS Score: 2 MEWS Score Color: Yellow Assess: SIRS CRITERIA SIRS Temperature : 0 SIRS Respirations : 0 SIRS Pulse: 1 SIRS WBC: 0 SIRS Score Sum : 1 SIRS Temperature : 0 SIRS Pulse: 1 SIRS Respirations : 0 SIRS WBC: 0 SIRS Score Sum : 1 Assess: if the MEWS score is Yellow or Red Were vital signs taken at a resting state?: Yes Focused Assessment: No change from prior assessment Does the patient meet 2 or more of the SIRS criteria?: No MEWS guidelines implemented : Yes, yellow Treat MEWS Interventions: Considered administering scheduled or prn medications/treatments as ordered Take Vital Signs Increase Vital Sign Frequency : Yellow: Q2hr x1, continue Q4hrs until patient remains green for 12hrs Escalate MEWS: Escalate: Yellow: Discuss with charge nurse and consider notifying provider and/or RRT Notify: Charge Nurse/RN Name of Charge Nurse/RN Notified: Carleene Cooper  Lizbeth Feijoo 12/03/2022, 3:54 PM

## 2022-12-03 NOTE — Progress Notes (Signed)
OT Cancellation Note  Patient Details Name: Autumn [name] MRN: [id number] DOB: 1956-07-05   Cancelled Treatment:    Reason Eval/Treat Not Completed: OT screened, no needs identified, will sign off  Evern Bio 12/03/2022, 3:57 PM Berna Spare, OTR/L Acute Rehabilitation Services Office: [phone]

## 2022-12-03 NOTE — ED Notes (Signed)
ED TO INPATIENT HANDOFF REPORT  ED Nurse Name and Phone #: Waunita Schooner Name/Age/Gender Autumn [name] 67 y.o. female Room/Bed: TRACC/TRACC  Code Status   Code Status: Full Code  Home/SNF/Other Home Patient oriented to: self, place, time, and situation Is this baseline? Yes   Triage Complete: Triage complete  Chief Complaint A-fib [I48.91]  Triage Note Pt in from home via [facility] with sob that suddenly worsened at 0030 - pt reports sickness x 1 wk with np dry cough with chest/nasal congestion. Rhonchi all fields per EMS. Afib HR 150-160's, pt states she took her Cardiazem at home around midnight. 1 Duoneb and  Solumedrol given en route. 89%RA initial sats, 100% on 6LNC.    Allergies Allergies  Allergen Reactions   Nortriptyline Hcl Anaphylaxis   Doxycycline Other (See Comments)    Level of Care/Admitting Diagnosis ED Disposition     ED Disposition  Admit   Condition  --   Comment  Hospital Area: [facility] [[id number]]  Level of Care: Telemetry Medical [104]  May place patient in observation at [facility] or [facility] if equivalent level of care is available:: No  Covid Evaluation: Asymptomatic - no recent exposure (last 10 days) testing not required  Diagnosis: A-fib [161096]  Admitting Physician: Nestor Ramp [[id number]]  Attending Physician: Nestor Ramp [[id number]]          B Medical/Surgery History Past Medical History:  Diagnosis Date   Anxiety    Arthritis    Atrial fibrillation    Attention deficit disorder (ADD)    COPD (chronic obstructive pulmonary disease)    Depression    Drug abuse    Fibromyalgia    Hyperlipidemia    Past Surgical History:  Procedure Laterality Date   ANKLE SURGERY     FACIAL COSMETIC SURGERY       A IV Location/Drains/Wounds Patient Lines/Drains/Airways Status     Active Line/Drains/Airways     Name Placement date Placement time Site Days   Peripheral IV 12/03/22 24 G Posterior;Right Hand  12/03/22  0117  Hand  less than 1   Peripheral IV 12/03/22 20 G Right Antecubital 12/03/22  0149  Antecubital  less than 1            Intake/Output Last 24 hours  Intake/Output Summary (Last 24 hours) at 12/03/2022 1403 Last data filed at 12/03/2022 1357 Gross per 24 hour  Intake 138.28 ml  Output 550 ml  Net -411.72 ml    Labs/Imaging Results for orders placed or performed during the hospital encounter of 12/03/22 (from the past 48 hour(s))  Basic metabolic panel     Status: Abnormal   Collection Time: 12/03/22  1:51 AM  Result Value Ref Range   Sodium 138 135 - 145 mmol/L   Potassium 3.5 3.5 - 5.1 mmol/L   Chloride 103 98 - 111 mmol/L   CO2 25 22 - 32 mmol/L   Glucose, Bld 158 (H) 70 - 99 mg/dL    Comment: Glucose reference range applies only to samples taken after fasting for at least 8 hours.   BUN 5 (L) 8 - 23 mg/dL   Creatinine, Ser 0.45 0.44 - 1.00 mg/dL   Calcium 8.9 8.9 - 40.9 mg/dL   GFR, Estimated >81 >19 mL/min    Comment: (NOTE) Calculated using the CKD-EPI Creatinine Equation (2021)    Anion gap 10 5 - 15    Comment: Performed at [facility], [address],  [place], [place] [address]  CBC with Differential     Status: Abnormal   Collection Time: 12/03/22  1:51 AM  Result Value Ref Range   WBC 11.6 (H) 4.0 - 10.5 K/uL   RBC 4.51 3.87 - 5.11 MIL/uL   Hemoglobin 12.6 12.0 - 15.0 g/dL   HCT 13.0 86.5 - 78.4 %   MCV 85.8 80.0 - 100.0 fL   MCH 27.9 26.0 - 34.0 pg   MCHC 32.6 30.0 - 36.0 g/dL   RDW 69.6 (H) 29.5 - 28.4 %   Platelets 249 150 - 400 K/uL   nRBC 0.0 0.0 - 0.2 %   Neutrophils Relative % 75 %   Neutro Abs 8.7 (H) 1.7 - 7.7 K/uL   Lymphocytes Relative 19 %   Lymphs Abs 2.2 0.7 - 4.0 K/uL   Monocytes Relative 5 %   Monocytes Absolute 0.5 0.1 - 1.0 K/uL   Eosinophils Relative 1 %   Eosinophils Absolute 0.1 0.0 - 0.5 K/uL   Basophils Relative 0 %   Basophils Absolute 0.0 0.0 - 0.1 K/uL   Immature Granulocytes 0 %   Abs Immature  Granulocytes 0.04 0.00 - 0.07 K/uL    Comment: Performed at [facility], [address] [place], [place] [address]  Brain natriuretic peptide     Status: Abnormal   Collection Time: 12/03/22  1:51 AM  Result Value Ref Range   B Natriuretic Peptide 283.0 (H) 0.0 - 100.0 pg/mL    Comment: Performed at [facility], [address], [place], [place] [address]  Magnesium     Status: None   Collection Time: 12/03/22  1:51 AM  Result Value Ref Range   Magnesium 1.9 1.7 - 2.4 mg/dL    Comment: Performed at [facility], [address], [place], [place] [address]  SARS Coronavirus 2 by RT PCR (hospital order, performed in [facility] hospital lab) *cepheid single result test* Anterior Nasal Swab     Status: None   Collection Time: 12/03/22  2:26 AM   Specimen: Anterior Nasal Swab  Result Value Ref Range   SARS Coronavirus 2 by RT PCR NEGATIVE NEGATIVE    Comment: Performed at [facility], [address] [place], [place] [address]  TSH     Status: None   Collection Time: 12/03/22 11:41 AM  Result Value Ref Range   TSH 0.846 0.350 - 4.500 uIU/mL    Comment: Performed by a 3rd Generation assay with a functional sensitivity of <=0.01 uIU/mL. Performed at [facility], [address], [place], [place] [address]   Rapid urine drug screen (hospital performed)     Status: Abnormal   Collection Time: 12/03/22 11:41 AM  Result Value Ref Range   Opiates NONE DETECTED NONE DETECTED   Cocaine NONE DETECTED NONE DETECTED   Benzodiazepines NONE DETECTED NONE DETECTED   Amphetamines POSITIVE (A) NONE DETECTED   Tetrahydrocannabinol POSITIVE (A) NONE DETECTED   Barbiturates NONE DETECTED NONE DETECTED    Comment: (NOTE) DRUG SCREEN FOR MEDICAL PURPOSES ONLY.  IF CONFIRMATION IS NEEDED FOR ANY PURPOSE, NOTIFY LAB WITHIN 5 DAYS.  LOWEST DETECTABLE LIMITS FOR URINE DRUG SCREEN Drug Class                     Cutoff (ng/mL) Amphetamine and metabolites     1000 Barbiturate and metabolites    200 Benzodiazepine  200 Opiates and metabolites        300 Cocaine and metabolites        300 THC                            50 Performed at [facility] [place] [facility], [address], [place], [place] [address]    CT HEAD WO CONTRAST ( )  Result Date: 12/03/2022 CLINICAL DATA:  Minor head trauma. EXAM: CT HEAD WITHOUT CONTRAST TECHNIQUE: Contiguous axial images were obtained from the base of the skull through the vertex without intravenous contrast. RADIATION DOSE REDUCTION: This exam was performed according to the departmental dose-optimization program which includes automated exposure control, adjustment of the Kiahna Banghart and/or kV according to patient size and/or use of iterative reconstruction technique. COMPARISON:  No comparison studies available. FINDINGS: Brain: There is no evidence for acute hemorrhage, hydrocephalus, mass lesion, or abnormal extra-axial fluid collection. No definite CT evidence for acute infarction. Diffuse loss of parenchymal volume is consistent with atrophy. Patchy low attenuation in the deep hemispheric and periventricular white matter is nonspecific, but likely reflects chronic microvascular ischemic demyelination. Vascular: No hyperdense vessel or unexpected calcification. Skull: No evidence for fracture. No worrisome lytic or sclerotic lesion. Sinuses/Orbits: The visualized paranasal sinuses and mastoid air cells are clear. Visualized portions of the globes and intraorbital fat are unremarkable. Other: None. IMPRESSION: 1. No acute intracranial abnormality. 2. Atrophy with chronic small vessel ischemic disease. Electronically Signed   By: Kennith Center M.D.   On: 12/03/2022 07:48   DG Chest Port 1 View  Result Date: 12/03/2022 CLINICAL DATA:  Shortness of breath. EXAM: PORTABLE CHEST 1 VIEW COMPARISON:  08/31/2022. FINDINGS: The heart is enlarged and the mediastinal contour is within normal limits. There is atherosclerotic  calcification of the aorta. Mild interstitial prominence and airspace opacities are noted at the lung bases. No effusion or pneumothorax. No acute osseous abnormality. IMPRESSION: 1. Cardiomegaly. 2. Interstitial prominence and airspace opacities at the lung bases, possible edema or infiltrate. Electronically Signed   By: Thornell Sartorius M.D.   On: 12/03/2022 02:26    Pending Labs Unresulted Labs (From admission, onward)     Start     Ordered   12/04/22 0500  Basic metabolic panel  Tomorrow morning,   R        12/03/22 0558   12/04/22 0500  CBC  Tomorrow morning,   R        12/03/22 0558   12/03/22 0554  HIV Antibody (routine testing w rflx)  (HIV Antibody (Routine testing w reflex) panel)  Once,   R        12/03/22 0558            Vitals/Pain Today's Vitals   12/03/22 1255 12/03/22 1300 12/03/22 1315 12/03/22 1335  BP: (!) 90/47 (!) 77/57 107/68   Pulse: (!) 115 (!) 116 (!) 111   Resp: 19 18 15    Temp:    98 F (36.7 C)  TempSrc:    Oral  SpO2: 96% 94% 92%   Weight:      PainSc:        Isolation Precautions No active isolations  Medications Medications  diltiazem (CARDIZEM) 1 mg/mL load via infusion 10 mg (10 mg Intravenous Bolus from Bag 12/03/22 0210)    And  diltiazem (CARDIZEM) 125 mg in dextrose 5% 125 mL (1 mg/mL) infusion (0 mg/hr Intravenous Stopped 12/03/22 1357)  atorvastatin (LIPITOR) tablet 40 mg (  40 mg Oral Given 12/03/22 1128)  albuterol (PROVENTIL) (2.5 MG/3ML) 0.083% nebulizer solution 3 mL (has no administration in time range)  apixaban (ELIQUIS) tablet 5 mg (5 mg Oral Given 12/03/22 1128)  ipratropium-albuterol (DUONEB) 0.5-2.5 (3) MG/3ML nebulizer solution 3 mL (has no administration in time range)  buprenorphine-naloxone (SUBOXONE) 8-2 mg per SL tablet 1 tablet (1 tablet Sublingual Given 12/03/22 1128)  umeclidinium bromide (INCRUSE ELLIPTA) 62.5 MCG/ACT 1 puff (1 puff Inhalation Given 12/03/22 1402)  carbidopa-levodopa (SINEMET IR) 25-100 MG per tablet  immediate release 1 tablet (has no administration in time range)  diltiazem (CARDIZEM) injection 10 mg (10 mg Intravenous Given 12/03/22 0402)    Mobility walks with device     Focused Assessments Cardiac Assessment Handoff:  Cardiac Rhythm: Atrial fibrillation Lab Results  Component Value Date   TROPONINI <0.03 06/27/2017   No results found for: "DDIMER" Does the Patient currently have chest pain? No    R Recommendations: See Admitting Provider Note  Report given to:   Additional Notes:

## 2022-12-03 NOTE — ED Triage Notes (Addendum)
Pt in from home via [facility] with sob that suddenly worsened at 0030 - pt reports sickness x 1 wk with np dry cough with chest/nasal congestion. Rhonchi all fields per EMS. Afib HR 150-160's, pt states she took her Cardiazem at home around midnight. 1 Duoneb and  Solumedrol given en route. 89%RA initial sats, 100% on 6LNC.

## 2022-12-03 NOTE — ED Notes (Signed)
Paused as pt bp is dropping. Remains alert and oriented x 4.

## 2022-12-04 ENCOUNTER — Other Ambulatory Visit (HOSPITAL_COMMUNITY): Payer: Self-pay

## 2022-12-04 ENCOUNTER — Inpatient Hospital Stay (HOSPITAL_COMMUNITY): Payer: 59

## 2022-12-04 DIAGNOSIS — G4733 Obstructive sleep apnea (adult) (pediatric): Secondary | ICD-10-CM | POA: Diagnosis present

## 2022-12-04 DIAGNOSIS — J9601 Acute respiratory failure with hypoxia: Secondary | ICD-10-CM | POA: Diagnosis present

## 2022-12-04 DIAGNOSIS — E785 Hyperlipidemia, unspecified: Secondary | ICD-10-CM | POA: Diagnosis present

## 2022-12-04 DIAGNOSIS — G20A1 Parkinson's disease without dyskinesia, without mention of fluctuations: Secondary | ICD-10-CM | POA: Diagnosis present

## 2022-12-04 DIAGNOSIS — M797 Fibromyalgia: Secondary | ICD-10-CM | POA: Diagnosis present

## 2022-12-04 DIAGNOSIS — Z91148 Patient's other noncompliance with medication regimen for other reason: Secondary | ICD-10-CM | POA: Diagnosis not present

## 2022-12-04 DIAGNOSIS — F17290 Nicotine dependence, other tobacco product, uncomplicated: Secondary | ICD-10-CM | POA: Diagnosis present

## 2022-12-04 DIAGNOSIS — I4821 Permanent atrial fibrillation: Secondary | ICD-10-CM | POA: Diagnosis present

## 2022-12-04 DIAGNOSIS — I739 Peripheral vascular disease, unspecified: Secondary | ICD-10-CM | POA: Diagnosis present

## 2022-12-04 DIAGNOSIS — F1910 Other psychoactive substance abuse, uncomplicated: Secondary | ICD-10-CM | POA: Diagnosis not present

## 2022-12-04 DIAGNOSIS — I4891 Unspecified atrial fibrillation: Secondary | ICD-10-CM | POA: Diagnosis not present

## 2022-12-04 DIAGNOSIS — I110 Hypertensive heart disease with heart failure: Secondary | ICD-10-CM | POA: Diagnosis present

## 2022-12-04 DIAGNOSIS — Z881 Allergy status to other antibiotic agents status: Secondary | ICD-10-CM | POA: Diagnosis not present

## 2022-12-04 DIAGNOSIS — Z79899 Other long term (current) drug therapy: Secondary | ICD-10-CM | POA: Diagnosis not present

## 2022-12-04 DIAGNOSIS — F17210 Nicotine dependence, cigarettes, uncomplicated: Secondary | ICD-10-CM | POA: Diagnosis present

## 2022-12-04 DIAGNOSIS — Z9981 Dependence on supplemental oxygen: Secondary | ICD-10-CM | POA: Diagnosis not present

## 2022-12-04 DIAGNOSIS — Z955 Presence of coronary angioplasty implant and graft: Secondary | ICD-10-CM | POA: Diagnosis not present

## 2022-12-04 DIAGNOSIS — J42 Unspecified chronic bronchitis: Secondary | ICD-10-CM | POA: Diagnosis not present

## 2022-12-04 DIAGNOSIS — R0602 Shortness of breath: Secondary | ICD-10-CM | POA: Diagnosis present

## 2022-12-04 DIAGNOSIS — F1210 Cannabis abuse, uncomplicated: Secondary | ICD-10-CM | POA: Diagnosis present

## 2022-12-04 DIAGNOSIS — Z7901 Long term (current) use of anticoagulants: Secondary | ICD-10-CM | POA: Diagnosis not present

## 2022-12-04 DIAGNOSIS — J441 Chronic obstructive pulmonary disease with (acute) exacerbation: Secondary | ICD-10-CM | POA: Diagnosis present

## 2022-12-04 DIAGNOSIS — I2510 Atherosclerotic heart disease of native coronary artery without angina pectoris: Secondary | ICD-10-CM | POA: Diagnosis present

## 2022-12-04 DIAGNOSIS — F1510 Other stimulant abuse, uncomplicated: Secondary | ICD-10-CM | POA: Diagnosis present

## 2022-12-04 DIAGNOSIS — Z1152 Encounter for screening for COVID-19: Secondary | ICD-10-CM | POA: Diagnosis not present

## 2022-12-04 DIAGNOSIS — I5023 Acute on chronic systolic (congestive) heart failure: Secondary | ICD-10-CM | POA: Diagnosis present

## 2022-12-04 LAB — BASIC METABOLIC PANEL
Anion gap: 9 (ref 5–15)
BUN: 10 mg/dL (ref 8–23)
CO2: 24 mmol/L (ref 22–32)
Calcium: 9.2 mg/dL (ref 8.9–10.3)
Chloride: 101 mmol/L (ref 98–111)
Creatinine, Ser: 0.86 mg/dL (ref 0.44–1.00)
GFR, Estimated: >60 mL/min (ref >60)
Glucose, Bld: 125 mg/dL — ABNORMAL HIGH (ref 70–99)
Potassium: 4.7 mmol/L (ref 3.5–5.1)
Sodium: 134 mmol/L — ABNORMAL LOW (ref 135–145)

## 2022-12-04 LAB — CBC
HCT: 36.4 % (ref 36.0–46.0)
Hemoglobin: 11.6 g/dL — ABNORMAL LOW (ref 12.0–15.0)
MCH: 27.5 pg (ref 26.0–34.0)
MCHC: 31.9 g/dL (ref 30.0–36.0)
MCV: 86.3 fL (ref 80.0–100.0)
Platelets: 242 10*3/uL (ref 150–400)
RBC: 4.22 MIL/uL (ref 3.87–5.11)
RDW: 16.6 % — ABNORMAL HIGH (ref 11.5–15.5)
WBC: 14.7 10*3/uL — ABNORMAL HIGH (ref 4.0–10.5)
nRBC: 0 % (ref 0.0–0.2)

## 2022-12-04 MED ORDER — FUROSEMIDE 10 MG/ML IJ SOLN
40.0000 mg | Freq: Once | INTRAMUSCULAR | Status: AC
  Administered 2022-12-04: 40 mg via INTRAVENOUS
  Filled 2022-12-04: qty 4

## 2022-12-04 MED ORDER — ATORVASTATIN CALCIUM 80 MG PO TABS
80.0000 mg | ORAL_TABLET | Freq: Every day | ORAL | Status: DC
  Administered 2022-12-05 – 2022-12-07 (×3): 80 mg via ORAL
  Filled 2022-12-04 (×3): qty 1

## 2022-12-04 MED ORDER — DAPAGLIFLOZIN PROPANEDIOL 10 MG PO TABS
10.0000 mg | ORAL_TABLET | Freq: Every day | ORAL | Status: DC
  Administered 2022-12-04 – 2022-12-07 (×4): 10 mg via ORAL
  Filled 2022-12-04 (×4): qty 1

## 2022-12-04 MED ORDER — GUAIFENESIN-DM 100-10 MG/5ML PO SYRP
5.0000 mL | ORAL_SOLUTION | Freq: Four times a day (QID) | ORAL | Status: DC | PRN
  Administered 2022-12-04 – 2022-12-05 (×2): 5 mL via ORAL
  Filled 2022-12-04 (×2): qty 5

## 2022-12-04 MED ORDER — BUDESONIDE 0.25 MG/2ML IN SUSP
0.2500 mg | Freq: Two times a day (BID) | RESPIRATORY_TRACT | Status: DC
  Administered 2022-12-04 – 2022-12-07 (×5): 0.25 mg via RESPIRATORY_TRACT
  Filled 2022-12-04 (×5): qty 2

## 2022-12-04 MED ORDER — UMECLIDINIUM-VILANTEROL 62.5-25 MCG/ACT IN AEPB
1.0000 | INHALATION_SPRAY | Freq: Every day | RESPIRATORY_TRACT | Status: DC
  Administered 2022-12-04 – 2022-12-07 (×4): 1 via RESPIRATORY_TRACT
  Filled 2022-12-04: qty 14

## 2022-12-04 NOTE — Progress Notes (Signed)
Daily Progress Note Intern Pager: [phone]  Patient name: Autumn [name] Medical record number: [id number] Date of birth: 1955/12/19 Age: 67 y.o. Gender: female  Primary Care Provider: Julieanne Manson, MD Consultants: Cardiology Code Status: FULL  Pt Overview and Major Events to Date:  4/25 - Admission 4/25 - Consult to Cardiology  Assessment and Plan: Autumn [name] is a 67 y.o. female presenting with A-fib with RVR.  Unclear as to etiology of her A-fib with RVR other possible causes of AFIB  include thyroid disease, heart failure, cocaine use or infectious process.  * Atrial fibrillation with rapid ventricular response (HCC) Atrial fibrillation with RVR in the setting of recent stimulant use.  UDS positive for amphetamines and cannabis.  Patient's HR improved after starting metoprolol succinate 50 mg nightly.  TSH WNL.  Appears less somnolent on evaluation today, sitting up in bed and speaking to his provider without any increased respiratory effort.  Per cards she is not a candidate for amiodarone given her prolonged QTc (502), will likely require a repeat EKG.  Lungs sound clear this morning, remains on home oxygen 2 L [place]. -Cardiology managing, appreciate recs -Continue metoprolol succinate 50 mg nightly -Continue home Eliquis 5 mg twice daily  Parkinson's disease Diagnoses couple of months ago and follows with neurologist out patient. Was started on Sinemet. -Continue Sinemet once med rec completed.  Congestive heart failure (CHF) (HCC) TTE showing LVEF 40-45%, unchanged from prior in Jan 2024. CXR initially concerning for edema vs infiltrates, which we will repeat today. Cardiology recommend avoiding diltiazem in setting of reduced EF.  Can consider adding Lasix 40 mg daily after rate control. - Cardiology following, appreciate recs -PTA Toprol-XL 100 mg daily -Follow-up repeat CXR -Daily I/os -Daily weights  Acute hypoxic respiratory failure (HCC) Per chart  review no previous diagnosis of COPD on chart review.  Satting well currently on 2.5 L [place].  She reports she is not regularly on home oxygen, does not tolerate CPAP.  No emphysematous changes on CXR.  Patient this morning requested home breztri, started on formulary equivalent.  Patient reports this morning that she feels like she is experiencing a "cold", has remained afebrile during hospitalization but has uptrending WBC 11.6 > 14.7.  Low suspicion for COPD exacerbation at the moment and will follow-up repeat CXR as described above. -O2 sat goal 88-2% -Continue management with bronchodilator -Continue Anoro Ellipta inhaler and budesonide nebulizer -Flonase -Discussed importance of smoking cessation  Polysubstance abuse (HCC) UDS positive for amphetamines and cannabis.  History of opioid use.  On sublingual Suboxone 8-2 mg TID. -Continue home Suboxone.   FEN/GI: Regular diet  VTE Prophylaxis: Home Eliquis  Dispo:Home pending clinical improvement .  Subjective:  Patient at bedside, on 2 L [place].  Sitting up in bed eating breakfast, reports she feels much better.  She does report feeling like she is experiencing a cold, reports chills and fevers but has remained afebrile per chart review.  She is hesitant to admit to do amphetamine use, educated patient on importance of abstaining from stimulant use moving forward.  Objective: Temp:  [97.9 F (36.6 C)-98.7 F (37.1 C)] 98.2 F (36.8 C) (04/26 1250) Pulse Rate:  [54-119] 78 (04/26 1250) Resp:  [15-20] 18 (04/26 1250) BP: (93-131)/(57-106) 131/106 (04/26 1250) SpO2:  [91 %-98 %] 94 % (04/26 1250) Weight:  [105.5 kg] 105.5 kg (04/26 0501) Physical Exam: General: Chronically ill-appearing woman, body habitus obese, awake, alert Cardiovascular: Irregularly irregular, no M/R/G appreciated Respiratory: Normal  work of breathing on 2 L [place], clear lung sounds anteriorly Abdomen: Distended, not out of proportion to body habitus,  nontender  Laboratory: Most recent CBC Lab Results  Component Value Date   WBC 14.7 (H) 12/04/2022   HGB 11.6 (L) 12/04/2022   HCT 36.4 12/04/2022   MCV 86.3 12/04/2022   PLT 242 12/04/2022   Most recent BMP    Latest Ref Rng & Units 12/04/2022    3:10 AM  BMP  Glucose 70 - 99 mg/dL 696   BUN 8 - 23 mg/dL 10   Creatinine 2.95 - 1.00 mg/dL 2.84   Sodium 132 - 440 mmol/L 134   Potassium 3.5 - 5.1 mmol/L 4.7   Chloride 98 - 111 mmol/L 101   CO2 22 - 32 mmol/L 24   Calcium 8.9 - 10.3 mg/dL 9.2     Imaging Studies Performed: Imaging Study: Chest x-ray My Interpretation: Cardiomegaly with airspace opacity.   Lorri Frederick, MD 12/04/2022, 1:16 PM PGY-1, [facility] FPTS Intern pager: [phone], text pages welcome Secure chat group [facility] Teaching Service

## 2022-12-04 NOTE — Progress Notes (Signed)
Heart Failure Nurse Navigator Progress Note    Patient with EF 40-45 % . Stated she is currently living with her Grandson. Patient stated she sees Dr. Joylene Grapes with [facility] and would like to stay there. Did Education on Heart failure sign and symptoms, daily weights, reported she currently hasn't been doing them, Diet/ fluid restrictions, taking all medications as prescribed and attending all medical appointments. Patient during interview was falling asleep off and on. Per patient request to stay with her Cardiologist we will not  schedule her a HF TOC appointment.   Navigator will sign off at this time.   Rhae Hammock, BSN, Scientist, clinical (histocompatibility and immunogenetics) Only

## 2022-12-04 NOTE — Progress Notes (Signed)
PT Cancellation Note  Patient Details Name: Autumn [name] MRN: [id number] DOB: August 16, 1955   Cancelled Treatment:    Reason Eval/Treat Not Completed: Medical issues which prohibited therapy (Spoke with RN who stated that pt is still in uncontrolled afib with movement. PT will follow up tomorrow if medically ready.)   Gladys Damme 12/04/2022, 3:52 PM

## 2022-12-04 NOTE — Progress Notes (Addendum)
Rounding Note    Patient Name: Autumn [name] Date of Encounter: 12/04/2022  [facility] Cardiologist: Thomasene Ripple, DO (new this admission)   Subjective    Has a productive cough and feels some congestion in her chest. No chest pain, palpitations, shortness of breath. Had some ankle edema yesterday, only has trace edema today   Inpatient Medications    Scheduled Meds:  apixaban  5 mg Oral BID   atorvastatin  40 mg Oral Daily   buprenorphine-naloxone  1 tablet Sublingual TID   carbidopa-levodopa  1 tablet Oral TID   fluticasone  1 spray Each Nare Daily   metoprolol succinate  50 mg Oral QHS   umeclidinium bromide  1 puff Inhalation Daily   Continuous Infusions:  PRN Meds: albuterol, guaiFENesin-dextromethorphan, ipratropium-albuterol   Vital Signs    Vitals:   12/03/22 2202 12/03/22 2350 12/04/22 0501 12/04/22 0805  BP: 115/68 107/86 109/74 112/76  Pulse: (!) 105 71 95 91  Resp:  18 16 18   Temp:  97.9 F (36.6 C) 97.9 F (36.6 C) 98.7 F (37.1 C)  TempSrc:  Oral Oral Oral  SpO2:  91% 97% 94%  Weight:   105.5 kg     Intake/Output Summary (Last 24 hours) at 12/04/2022 0850 Last data filed at 12/04/2022 0504 Gross per 24 hour  Intake 51.42 ml  Output 850 ml  Net -798.58 ml      12/04/2022    5:01 AM 12/03/2022    1:44 AM 02/19/2021    9:36 AM  Last 3 Weights  Weight (lbs) 232 lb 9.4 oz 242 lb 238 lb 12.8 oz  Weight (kg) 105.5 kg 109.77 kg 108.319 kg      Telemetry    Atrial fibrillation, HR in the 90s-1001 - Personally Reviewed  ECG    Atrial fibrillation with HR 147 BPM - Personally Reviewed  Physical Exam   GEN: No acute distress.  Sitting upright on the side of the bed eating breakfast  Neck: No JVD Cardiac: Irregular rate and rhythm. no murmurs, rubs, or gallops.  Respiratory: Crackles in bilateral lung bases. Also had crackles in upper lung bases that cleared with cough. Occasional expiratory wheezing  GI: Soft, nontender,  non-distended  MS: Trace edema in BLE; No deformity. Neuro:  Nonfocal  Psych: Normal affect   Labs    High Sensitivity Troponin:   Recent Labs  Lab 12/03/22 1438 12/03/22 1552  TROPONINIHS 18* 17     Chemistry Recent Labs  Lab 12/03/22 0151 12/04/22 0310  NA 138 134*  K 3.5 4.7  CL 103 101  CO2 25 24  GLUCOSE 158* 125*  BUN 5* 10  CREATININE 0.85 0.86  CALCIUM 8.9 9.2  MG 1.9  --   GFRNONAA >60 >60  ANIONGAP 10 9    Lipids  Recent Labs  Lab 12/03/22 1552  CHOL 180  TRIG 65  HDL 58  LDLCALC 109*  CHOLHDL 3.1    Hematology Recent Labs  Lab 12/03/22 0151 12/04/22 0310  WBC 11.6* 14.7*  RBC 4.51 4.22  HGB 12.6 11.6*  HCT 38.7 36.4  MCV 85.8 86.3  MCH 27.9 27.5  MCHC 32.6 31.9  RDW 16.6* 16.6*  PLT 249 242   Thyroid  Recent Labs  Lab 12/03/22 1141  TSH 0.846    BNP Recent Labs  Lab 12/03/22 0151  BNP 283.0*    DDimer No results for input(s): "DDIMER" in the last 168 hours.   Radiology  ECHOCARDIOGRAM COMPLETE  Result Date: 12/03/2022    ECHOCARDIOGRAM REPORT   Patient Name:   Autumn [name] Fluhr Date of Exam: 12/03/2022 Medical Rec #:  [id number]       Height:       65.0 in Accession #:    [id number]      Weight:       242.0 lb Date of Birth:  22-Sep-1955       BSA:          2.145 m Patient Age:    66 years        BP:           105/74 mmHg Patient Gender: F               HR:           126 bpm. Exam Location:  Inpatient Procedure: 2D Echo, Color Doppler and Cardiac Doppler Indications:    CHF  History:        Patient has prior history of Echocardiogram examinations, most                 recent 01/12/2021. CHF; Arrythmias:Atrial Fibrillation.  Sonographer:    Milbert Coulter Referring Phys: [id number] DAVID GLICK IMPRESSIONS  1. Left ventricular ejection fraction, by estimation, is 40 to 45% in the setting of rapid atrial fbrillation with heart rate 125-130bpm.. The left ventricle has mildly decreased function. The left ventricle demonstrates global hypokinesis. The  left ventricular internal cavity size was mildly dilated. Left ventricular diastolic function could not be evaluated.  2. Right ventricular systolic function is normal. The right ventricular size is normal.  3. Right atrial size was mildly dilated.  4. The mitral valve is degenerative. Mild mitral valve regurgitation. No evidence of mitral stenosis. Moderate mitral annular calcification.  5. The aortic valve is tricuspid. Aortic valve regurgitation is trivial. Aortic valve sclerosis/calcification is present, without any evidence of aortic stenosis.  6. The inferior vena cava is normal in size with greater than 50% respiratory variability, suggesting right atrial pressure of 3 mmHg.  7. Recommend repeat limited echo once heart rate controlled to more accurately assess LVF. FINDINGS  Left Ventricle: Left ventricular ejection fraction, by estimation, is 40 to 45%. The left ventricle has mildly decreased function. The left ventricle demonstrates global hypokinesis. The left ventricular internal cavity size was mildly dilated. There is  no left ventricular hypertrophy. Left ventricular diastolic function could not be evaluated due to atrial fibrillation. Left ventricular diastolic function could not be evaluated. Right Ventricle: The right ventricular size is normal. No increase in right ventricular wall thickness. Right ventricular systolic function is normal. Left [facility]: Left atrial size was normal in size. Right [facility]: Right atrial size was mildly dilated. Pericardium: There is no evidence of pericardial effusion. Mitral Valve: The mitral valve is degenerative in appearance. Moderate mitral annular calcification. Mild mitral valve regurgitation. No evidence of mitral valve stenosis. Tricuspid Valve: The tricuspid valve is normal in structure. Tricuspid valve regurgitation is trivial. No evidence of tricuspid stenosis. Aortic Valve: The aortic valve is tricuspid. Aortic valve regurgitation is trivial. Aortic valve  sclerosis/calcification is present, without any evidence of aortic stenosis. Pulmonic Valve: The pulmonic valve was normal in structure. Pulmonic valve regurgitation is not visualized. No evidence of pulmonic stenosis. Aorta: The aortic root is normal in size and structure. Venous: The inferior vena cava is normal in size with greater than 50% respiratory variability, suggesting right atrial pressure of 3 mmHg. IAS/Shunts: No atrial level  shunt detected by color flow Doppler.  LEFT VENTRICLE PLAX 2D LVIDd:         5.70 cm LVIDs:         3.70 cm LV PW:         1.20 cm LV IVS:        1.10 cm LVOT diam:     2.00 cm LVOT Area:     3.14 cm  LV Volumes (MOD) LV vol d, MOD A2C: 64.3 ml LV vol d, MOD A4C: 80.1 ml LV vol s, MOD A2C: 37.0 ml LV vol s, MOD A4C: 53.7 ml LV SV MOD A2C:     27.3 ml LV SV MOD A4C:     80.1 ml LV SV MOD BP:      25.3 ml RIGHT VENTRICLE RV Basal diam:  2.90 cm RV Mid diam:    1.90 cm LEFT ATRIUM             Index        RIGHT ATRIUM           Index LA diam:        4.30 cm 2.00 cm/m   RA Area:     22.10 cm LA Vol (A2C):   74.5 ml 34.73 ml/m  RA Volume:   63.30 ml  29.51 ml/m LA Vol (A4C):   44.2 ml 20.61 ml/m LA Biplane Vol: 60.4 ml 28.16 ml/m   SHUNTS Systemic Diam: 2.00 cm Armanda Magic MD Electronically signed by Armanda Magic MD Signature Date/Time: 12/03/2022/2:05:47 PM    Final    CT HEAD WO CONTRAST ( )  Result Date: 12/03/2022 CLINICAL DATA:  Minor head trauma. EXAM: CT HEAD WITHOUT CONTRAST TECHNIQUE: Contiguous axial images were obtained from the base of the skull through the vertex without intravenous contrast. RADIATION DOSE REDUCTION: This exam was performed according to the departmental dose-optimization program which includes automated exposure control, adjustment of the mA and/or kV according to patient size and/or use of iterative reconstruction technique. COMPARISON:  No comparison studies available. FINDINGS: Brain: There is no evidence for acute hemorrhage,  hydrocephalus, mass lesion, or abnormal extra-axial fluid collection. No definite CT evidence for acute infarction. Diffuse loss of parenchymal volume is consistent with atrophy. Patchy low attenuation in the deep hemispheric and periventricular white matter is nonspecific, but likely reflects chronic microvascular ischemic demyelination. Vascular: No hyperdense vessel or unexpected calcification. Skull: No evidence for fracture. No worrisome lytic or sclerotic lesion. Sinuses/Orbits: The visualized paranasal sinuses and mastoid air cells are clear. Visualized portions of the globes and intraorbital fat are unremarkable. Other: None. IMPRESSION: 1. No acute intracranial abnormality. 2. Atrophy with chronic small vessel ischemic disease. Electronically Signed   By: Kennith Center M.D.   On: 12/03/2022 07:48   DG Chest Port 1 View  Result Date: 12/03/2022 CLINICAL DATA:  Shortness of breath. EXAM: PORTABLE CHEST 1 VIEW COMPARISON:  08/31/2022. FINDINGS: The heart is enlarged and the mediastinal contour is within normal limits. There is atherosclerotic calcification of the aorta. Mild interstitial prominence and airspace opacities are noted at the lung bases. No effusion or pneumothorax. No acute osseous abnormality. IMPRESSION: 1. Cardiomegaly. 2. Interstitial prominence and airspace opacities at the lung bases, possible edema or infiltrate. Electronically Signed   By: Thornell Sartorius M.D.   On: 12/03/2022 02:26    Cardiac Studies   Echocardiogram 12/03/22 1. Left ventricular ejection fraction, by estimation, is 40 to 45% in the  setting of rapid atrial fbrillation with heart rate 125-130bpm..  The left  ventricle has mildly decreased function. The left ventricle demonstrates  global hypokinesis. The left  ventricular internal cavity size was mildly dilated. Left ventricular  diastolic function could not be evaluated.   2. Right ventricular systolic function is normal. The right ventricular  size is normal.    3. Right atrial size was mildly dilated.   4. The mitral valve is degenerative. Mild mitral valve regurgitation. No  evidence of mitral stenosis. Moderate mitral annular calcification.   5. The aortic valve is tricuspid. Aortic valve regurgitation is trivial.  Aortic valve sclerosis/calcification is present, without any evidence of  aortic stenosis.   6. The inferior vena cava is normal in size with greater than 50%  respiratory variability, suggesting right atrial pressure of 3 mmHg.   7. Recommend repeat limited echo once heart rate controlled to more  accurately assess LVF.   Patient Profile     67 y.o. female female with a hx of CAD status post stenting to LAD in 2020, chronic HFrEF, paroxysmal atrial fibrillation, moderate mitral regurgitation, PAD, COPD, Parkinson's, substance use, hypertension who is being seen 12/03/2022 for the evaluation of A-fib RVR at the request of Dr. Jennette Kettle.   Assessment & Plan    Paroxysmal Atrial Fibrillation with RVR  - Patient has been followed by both [facility] and [facility] cardiology in the past. Her last cardiology visit from 09/17/22 with [facility] showed that patient was suppose to be taking digoxin 0.125 mg daily, metoprolol succinate 200 mg daily, and eliquis 5 mg BID. At that visit, she was in afib with HR in the 70s. She admits to medication noncompliance recently  - Patient presented this admission in afib with RVR, HR up to the 160s. This occurred in the setting of medication noncompliance, chest congestion, and recent meth use. Patient reports that she is always in afib, and has failed cardioversion in the past  - At this point, very possible that patient has permanent afib   - She was started on IV dilt drip in the ED with improved HR, but she became hypotensive and dilt was discontinued. Diltiazem is not a good medication for her given reduced EF  - Now, patient is on metoprolol succinate 50 mg daily  - Per telemetry, she remains in afib with HR  ranging from 95-106 BPM  - Continue metoprolol with plans to titrate as BP allows  - Continue eliquis 5 mg BID  - Holding digoxin for now given concerns for noncompliance. Not on amiodarone given prolonged Qtc of 502 on initial EKG. Note, it is difficult to evaluate Qtc on initial EKG, planning to repeat EKG this AM  - Patient is not an ideal candidate for cardioversion given medication noncompliance and ongoing meth use   Acute on Chronic HFrEF  - Echocardiogram from [facility] in 08/2022 showed EF 40-45% with mild global hypokinesis, mild-moderately reduced RV systolic function  - Echo this admission showed EF 40-45%, global hypokinesis, normal RV systolic function  - Patient has trace ankle edema and crackles in lungs. BNP elevated to 283. CXR with interstitial prominence and airspace opacities at lung bases, possible edema or infiltrate  - Ordered one time dose of IV lasix 40 mg. Follow strict I/Os, daily weights  - Continue metoprolol succinate. BP currently too soft to add additional GDMT   Mild mitral valve regurgitation  - Noted on echocardiogram this admission  - Will need repeat echo in 3-5 years   Prolonged Qtc - Initial EKG showed Qtc  502 ms - Rechecking EKG this AM   HLD  - Lipid panel this admission showed LDL 109, HDL 58, triglycerides 65, total cholesterol 180  - Increase lipitor to 80 mg daily  - Will need LFTs and lipid panel in 8 weeks   CAD  - Previously had stent to LAD in 2020 - Continue metoprolol and lipitor - Not on ASA as she is on eliquis   Otherwise per primary  - Parkinson's disease - Polysubstance abuse- on sublingual suboxone. 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 I personally reviewed laboratory data, imaging studies and relevant notes. I independently examined the patient and formulated the important aspects of the plan. I have personally discussed the plan with the patient and/or family. Comments or changes to the note/plan are indicated below.  Heart rate has improved since starting her toprol xl - keep at 50mg  for now titrate up as bp can tolerate.  Unclear if permanent afib - follows at [facility]. For [place] her we will invest in rate control and the rhythm strategy will be discussed further with her primary cardiologist at [facility].  Will benefit from a dose of lasix today  She is also wheezing will defer to primary team for as needed nebs Blood pressure currently will not tolerarte initiating GDMT for her depressed ef.  Thomasene Ripple DO, MS [facility] Attending Cardiologist [facility]  [address] [place], [place] [address] [phone] Website: [email or web address]

## 2022-12-04 NOTE — Progress Notes (Signed)
   Heart Failure Stewardship Pharmacist Progress Note   PCP: Julieanne Manson, MD PCP-Cardiologist: Thomasene Ripple, DO    HPI:  67 yo F with PMH of CHF, HTN, afib s/p multiple failed cardioversions, CAD, HLD, PAD, COPD, Parkinson's disease, and polysubstance use.  Presented to the ED on 4/25 with shortness of breath, palpitations, and chest discomfort. CXR with cardiomegaly and possible edema vs infiltrate. Found to be in afib RVR. Started on IV diltiazem which was later discontinued given hypotension. Cardiology was consulted and started on BB. Due to concerns of noncompliance, QTc prolongation, and substance use, deferring use of digoxin or amiodarone. ECHO showed EF 40-45% (unchanged from Jan 2024, was 60-65% in 01/2021). Repeating CXR.   Current HF Medications: Diuretic: furosemide 40 mg IV x 1 Beta Blocker: metoprolol XL 50 mg daily  Prior to admission HF Medications: Diuretic: furosemide 40 mg daily Beta blocker: metoprolol XL 200 mg daily ACE/ARB/ARNI: lisinopril 2.5 mg daily  Pertinent Lab Values: Serum creatinine 0.86, BUN 10, Potassium 4.7, Sodium 134, BNP 283, Magnesium 1.9  Vital Signs: Weight: 232 lbs (admission weight: estimated 242 lbs) Blood pressure: 110/70s  Heart rate: 90-100s  I/O: -0.4L yesterday; net -0.7L  Medication Assistance / Insurance Benefits Check: Does the patient have prescription insurance?  Yes Type of insurance plan: [place]  Outpatient Pharmacy:  Prior to admission outpatient pharmacy: [facility] Is the patient willing to use [facility] TOC pharmacy at discharge? Yes Is the patient willing to transition their outpatient pharmacy to utilize a [facility] outpatient pharmacy?   Pending    Assessment: 1. Acute on chronic systolic and diastolic CHF (LVEF 40-45%), due to NICM. NYHA class II-III symptoms. - Agree with furosemide 40 mg IV x 1. Strict I/Os and daily weights. Dry weight at hospital follow up in February was 223 lbs. -  Continue metoprolol XL 50 mg daily, she was prescribed 200 mg daily PTA. May need to increase this dose.  - Holding PTA lisinopril, BP has been low at times. Trend BP to see if this can be restarted prior to discharge. - Consider starting spironolactone prior to discharge - Can consider starting Farxiga since BP may limit other GDMT additions at this time   Plan: 1) Medication changes recommended at this time: - Add Farxiga 10 mg daily  2) Patient assistance: - Jardiance/Farxiga copay $0 - Entresto copay $0  3)  Education  - To be completed prior to discharge  Sharen Hones, PharmD, BCPS Heart Failure Stewardship Pharmacist Phone [phone]

## 2022-12-05 DIAGNOSIS — I4891 Unspecified atrial fibrillation: Secondary | ICD-10-CM | POA: Diagnosis not present

## 2022-12-05 DIAGNOSIS — R9431 Abnormal electrocardiogram [ECG] [EKG]: Secondary | ICD-10-CM | POA: Insufficient documentation

## 2022-12-05 DIAGNOSIS — F1910 Other psychoactive substance abuse, uncomplicated: Secondary | ICD-10-CM

## 2022-12-05 LAB — BASIC METABOLIC PANEL
Anion gap: 7 (ref 5–15)
BUN: 16 mg/dL (ref 8–23)
CO2: 27 mmol/L (ref 22–32)
Calcium: 8.8 mg/dL — ABNORMAL LOW (ref 8.9–10.3)
Chloride: 102 mmol/L (ref 98–111)
Creatinine, Ser: 0.84 mg/dL (ref 0.44–1.00)
GFR, Estimated: >60 mL/min (ref >60)
Glucose, Bld: 96 mg/dL (ref 70–99)
Potassium: 4.1 mmol/L (ref 3.5–5.1)
Sodium: 136 mmol/L (ref 135–145)

## 2022-12-05 LAB — CBC
HCT: 38 % (ref 36.0–46.0)
Hemoglobin: 11.5 g/dL — ABNORMAL LOW (ref 12.0–15.0)
MCH: 26.9 pg (ref 26.0–34.0)
MCHC: 30.3 g/dL (ref 30.0–36.0)
MCV: 88.8 fL (ref 80.0–100.0)
Platelets: 241 10*3/uL (ref 150–400)
RBC: 4.28 MIL/uL (ref 3.87–5.11)
RDW: 16.8 % — ABNORMAL HIGH (ref 11.5–15.5)
WBC: 9.5 10*3/uL (ref 4.0–10.5)
nRBC: 0 % (ref 0.0–0.2)

## 2022-12-05 MED ORDER — BENZONATATE 100 MG PO CAPS
100.0000 mg | ORAL_CAPSULE | Freq: Three times a day (TID) | ORAL | Status: DC | PRN
  Administered 2022-12-05 – 2022-12-06 (×4): 100 mg via ORAL
  Filled 2022-12-05 (×4): qty 1

## 2022-12-05 MED ORDER — AZITHROMYCIN 250 MG PO TABS
500.0000 mg | ORAL_TABLET | Freq: Every day | ORAL | Status: AC
  Administered 2022-12-05 – 2022-12-07 (×3): 500 mg via ORAL
  Filled 2022-12-05 (×3): qty 2

## 2022-12-05 MED ORDER — BENZONATATE 100 MG PO CAPS: 100.0000 mg | ORAL_CAPSULE | Freq: Three times a day (TID) | ORAL | Status: DC | PRN

## 2022-12-05 MED ORDER — METOPROLOL SUCCINATE ER 100 MG PO TB24
100.0000 mg | ORAL_TABLET | Freq: Every day | ORAL | Status: DC
  Administered 2022-12-05 – 2022-12-06 (×2): 100 mg via ORAL
  Filled 2022-12-05 (×2): qty 1

## 2022-12-05 MED ORDER — PREDNISONE 20 MG PO TABS: 40.0000 mg | ORAL_TABLET | Freq: Every day | ORAL | Status: DC

## 2022-12-05 MED ORDER — LOSARTAN POTASSIUM 25 MG PO TABS
25.0000 mg | ORAL_TABLET | Freq: Every day | ORAL | Status: DC
  Administered 2022-12-05 – 2022-12-07 (×3): 25 mg via ORAL
  Filled 2022-12-05 (×3): qty 1

## 2022-12-05 NOTE — Progress Notes (Addendum)
Physical Therapy Treatment & Discharge Patient Details Name: Autumn [name] MRN: [id number] DOB: 01-16-1956 Today's Date: 12/05/2022   History of Present Illness Patient is 67 y.o. female presenting 12/03/22 with palpitations, SOB, fall 2 weeks ago with mild BLE swelling. Workup for afib with RVR, COPD exacerbation. and found to be in A-fib with RVR. PMH includes Parkinson's disease, CHF, COPD, HLD, fibromyalgia, OA, ADD, anxiety, depression, polysubstance abuse on suboxone.   PT Comments    Pt progressing with mobility. Today's session focused on gait training with rollator, pt preference for size of bariatric rollator. Pt mod indep with mobility and self-care tasks. All education completed; pt reports no further questions or concerns. Will d/c acute PT.   98% on RA HR 110s-145    Recommendations for follow up therapy are one component of a multi-disciplinary discharge planning process, led by the attending physician.  Recommendations may be updated based on patient status, additional functional criteria and insurance authorization.        Assistance Recommended at Discharge PRN  Patient can return home with the following  N/A   Equipment Recommendations  Rollator (4 wheels) (bariatric) - pt only wants if covered by insurance   Recommendations for Other Services       Precautions / Restrictions Precautions Precautions: Other (comment) Precaution Comments: watch HR Restrictions Weight Bearing Restrictions: No     Mobility  Bed Mobility Overal bed mobility: Independent                  Transfers Overall transfer level: Independent Equipment used: None, Rollator (4 wheels) Transfers: Sit to/from Stand Sit to Stand: Independent                Ambulation/Gait Ambulation/Gait assistance: Modified independent (Device/Increase time)   Assistive device: Rollator (4 wheels) Gait Pattern/deviations: Step-through pattern, Decreased stride length Gait velocity:  Decreased     General Gait Details: pt ambulatory in room indep without DME; hallway gait trial with rollator, 1x self-directed seated rest break secondary to c/o SOB and fatigue, educ on backing rollator against wall to stabilize; HR up to 145 with ambulation   Stairs             Wheelchair Mobility    Modified Rankin (Stroke Patients Only)       Balance Overall balance assessment: No apparent balance deficits (not formally assessed)   Sitting balance-Leahy Scale: Good     Standing balance support: No upper extremity supported Standing balance-Leahy Scale: Fair                              Cognition Arousal/Alertness: Awake/alert Behavior During Therapy: WFL for tasks assessed/performed Overall Cognitive Status: Within Functional Limits for tasks assessed                                          Exercises      General Comments General comments (skin integrity, edema, etc.): 98% on RA, HR 110s-145. pt reports current rollator is falling apart, also likes size of bariatric rollator - requesting new one if an option, case manager notified      Pertinent Vitals/Pain Pain Assessment Pain Assessment: No/denies pain    Home Living  Prior Function            PT Goals (current goals can now be found in the care plan section) Progress towards PT goals: Goals met/education completed, patient discharged from PT    Frequency    Min 1X/week      PT Plan Current plan remains appropriate    Co-evaluation              AM-PAC PT "6 Clicks" Mobility   Outcome Measure  Help needed turning from your back to your side while in a flat bed without using bedrails?: None Help needed moving from lying on your back to sitting on the side of a flat bed without using bedrails?: None Help needed moving to and from a bed to a chair (including a wheelchair)?: None Help needed standing up from a chair  using your arms (e.g., wheelchair or bedside chair)?: None Help needed to walk in hospital room?: None Help needed climbing 3-5 steps with a railing? : A Little 6 Click Score: 23    End of Session   Activity Tolerance: Patient tolerated treatment well Patient left: in bed;with call bell/phone within reach Nurse Communication: Mobility status PT Visit Diagnosis: Unsteadiness on feet (R26.81);Muscle weakness (generalized) (M62.81);Difficulty in walking, not elsewhere classified (R26.2)     Time: 1610-9604 PT Time Calculation (min) (ACUTE ONLY): 13 min  Charges:  $Gait Training: 8-22 mins                     Ina Homes, PT, DPT Acute Rehabilitation Services  Personal: Secure Chat Rehab Office: [phone]  Malachy Chamber 12/05/2022, 4:31 PM

## 2022-12-05 NOTE — Assessment & Plan Note (Deleted)
QTc prolonged to 502 on EKG from 4/25.  Will repeat today per cardiology -Follow-up EKG -Avoid QT prolonging medications as able

## 2022-12-05 NOTE — Progress Notes (Signed)
Daily Progress Note Intern Pager: [phone]  Patient name: Autumn [name] Medical record number: [id number] Date of birth: 01-May-1956 Age: 67 y.o. Gender: female  Primary Care Provider: Julieanne Manson, MD Consultants: Cardiology Code Status: Full  Pt Overview and Major Events to Date:  4/25-admitted  Assessment and Plan: Autumn [name] is a 67 y.o. female presenting with A-fib with RVR. Unclear as to etiology of her A-fib with RVR other possible causes of AFIB include thyroid disease, heart failure, cocaine use or infectious process.  PMHx includes CAD with stent in 2020, HTN, A-fib, HLD, Parkinson's, systolic HFrEF, COPD, substance use disorder, GERD  * Atrial fibrillation with rapid ventricular response (HCC) Initially in a fib with RVR in the setting of recent stimulant use.  UDS positive for amphetamines and cannabis. Pt still in a-fib w/ HR in low 100-1-teens over night on Torprol XL 50 mg. TSH WNL.  Per cards she is not a candidate for amiodarone given her prolonged QTc and not candidate for cardioversion due to substance use and medication noncompliance. -Cardiology on board, appreciate recs -Increase metoprolol succinate to 100 mg nightly per cardiology -Continue home Eliquis 5 mg twice daily  COPD (chronic obstructive pulmonary disease) (HCC) She thinks she is supposed be on oxygen at home but has not been due to living situation.  She is currently breathing comfortably on room air.  Has not been using any of the PRNs including albuterol and DuoNebs however she did not know she could request them.  She is not short of breath but currently has productive cough, remains afebrile. CXR yesterday stable from prior with some haziness of lung bases.  Will start treatment for COPD exacerbation with azithromycin in setting of productive cough. Will not treat with prednisone at this time given tenuous volume status in setting of CHF.  -O2 sat goal 88-92% -Continue Anoro Ellipta  inhaler and budesonide nebulizer -DuoNebs every 4 hours as needed -Azithromycin 500 mg daily(day 1/3) -Continue Tessalon Perles and Robitussin as needed per patient request -Encourage smoking cessation   Congestive heart failure (CHF) (HCC) TTE showing LVEF 40-45%, unchanged from prior in Jan 2024. CXR yesterday showing decreasing vascular congestion. S/p lasix 40 mg IV yesterday w/ 24 UOP 1.65 L. Cardiology recommend avoiding diltiazem in setting of reduced EF.  - Cardiology following, appreciate recs - Toprol-XL 100 mg daily -Losartan 25 mg daily starting today per cardiology -Daily I/os -Daily weights  Polysubstance abuse (HCC) UDS positive for amphetamines and cannabis.  History of opioid use.  On sublingual Suboxone 8-2 mg TID. -Continue home Suboxone. -TOC consulted for substance abuse counseling  Parkinson's disease Diagnoses couple of months ago and follows with neurologist out patient. Was started on Sinemet. -Continue Sinemet once med rec completed.    FEN/GI: Heart healthy PPx: Eliquis Dispo: Pending continued medical management  Subjective:  Patient states her nonproductive cough started a week ago but is now productive in the room today.  She does think the Robitussin and Tessalon Perles have helped.  She denies feeling short of breath.  Objective: Temp:  [97.6 F (36.4 C)-98.2 F (36.8 C)] 97.7 F (36.5 C) (04/27 0313) Pulse Rate:  [70-114] 70 (04/27 1129) Resp:  [18-20] 18 (04/27 0835) BP: (100-131)/(69-106) 126/69 (04/27 1129) SpO2:  [90 %-100 %] 94 % (04/27 1129) Weight:  [99.3 kg] 99.3 kg (04/27 0313) Physical Exam: General: Patient sitting up in bed, NAD Cardiovascular: Irregularly irregular Respiratory: Rhonchorous breath sounds throughout Extremities: 2+ pitting edema of RLE, trace  pitting edema of LLE  Laboratory: Most recent CBC Lab Results  Component Value Date   WBC 9.5 12/05/2022   HGB 11.5 (L) 12/05/2022   HCT 38.0 12/05/2022   MCV  88.8 12/05/2022   PLT 241 12/05/2022   Most recent BMP    Latest Ref Rng & Units 12/05/2022    2:30 AM  BMP  Glucose 70 - 99 mg/dL 96   BUN 8 - 23 mg/dL 16   Creatinine 8.11 - 1.00 mg/dL 9.14   Sodium 782 - 956 mmol/L 136   Potassium 3.5 - 5.1 mmol/L 4.1   Chloride 98 - 111 mmol/L 102   CO2 22 - 32 mmol/L 27   Calcium 8.9 - 10.3 mg/dL 8.8      Autumn Alley, DO 12/05/2022, 12:12 PM  PGY-2, [facility] FPTS Intern pager: [phone], text pages welcome Secure chat group [facility] Teaching Service

## 2022-12-05 NOTE — Plan of Care (Signed)

## 2022-12-05 NOTE — Progress Notes (Signed)
Rounding Note    Patient Name: Autumn [name] Date of Encounter: 12/05/2022  [facility] Cardiologist: Thomasene Ripple, DO (new this admission)   Subjective    Cough no palpitations   Inpatient Medications    Scheduled Meds:  apixaban  5 mg Oral BID   atorvastatin  80 mg Oral Daily   budesonide  0.25 mg Nebulization BID   buprenorphine-naloxone  1 tablet Sublingual TID   carbidopa-levodopa  1 tablet Oral TID   dapagliflozin propanediol  10 mg Oral Daily   fluticasone  1 spray Each Nare Daily   metoprolol succinate  50 mg Oral QHS   umeclidinium-vilanterol  1 puff Inhalation Daily   Continuous Infusions:  PRN Meds: albuterol, benzonatate, guaiFENesin-dextromethorphan, ipratropium-albuterol   Vital Signs    Vitals:   12/05/22 0100 12/05/22 0313 12/05/22 0758 12/05/22 0835  BP:  100/71  106/84  Pulse: (!) 110 (!) 114  97  Resp:  18  18  Temp:  97.7 F (36.5 C)    TempSrc:  Oral    SpO2: 96% 98% 100% 95%  Weight:  99.3 kg    Height:        Intake/Output Summary (Last 24 hours) at 12/05/2022 1010 Last data filed at 12/05/2022 0104 Gross per 24 hour  Intake --  Output 1650 ml  Net -1650 ml      12/05/2022    3:13 AM 12/04/2022    5:01 AM 12/03/2022    1:44 AM  Last 3 Weights  Weight (lbs) 219 lb 232 lb 9.4 oz 242 lb  Weight (kg) 99.338 kg 105.5 kg 109.77 kg      Telemetry    Atrial fibrillation, HR in the 90s-1001 - Personally Reviewed  ECG    Atrial fibrillation with HR 147 BPM - Personally Reviewed  Physical Exam   GEN: No acute distress.  Sitting upright on the side of the bed eating breakfast  Neck: No JVD Cardiac: Irregular rate and rhythm. no murmurs, rubs, or gallops.  Respiratory: Crackles in bilateral lung bases. Also had crackles in upper lung bases that cleared with cough. Occasional expiratory wheezing  GI: Soft, nontender, non-distended  MS: Trace edema in BLE; No deformity. Neuro:  Nonfocal  Psych: Normal affect   Labs     High Sensitivity Troponin:   Recent Labs  Lab 12/03/22 1438 12/03/22 1552  TROPONINIHS 18* 17     Chemistry Recent Labs  Lab 12/03/22 0151 12/04/22 0310 12/05/22 0230  NA 138 134* 136  K 3.5 4.7 4.1  CL 103 101 102  CO2 25 24 27   GLUCOSE 158* 125* 96  BUN 5* 10 16  CREATININE 0.85 0.86 0.84  CALCIUM 8.9 9.2 8.8*  MG 1.9  --   --   GFRNONAA >60 >60 >60  ANIONGAP 10 9 7     Lipids  Recent Labs  Lab 12/03/22 1552  CHOL 180  TRIG 65  HDL 58  LDLCALC 109*  CHOLHDL 3.1    Hematology Recent Labs  Lab 12/03/22 0151 12/04/22 0310 12/05/22 0230  WBC 11.6* 14.7* 9.5  RBC 4.51 4.22 4.28  HGB 12.6 11.6* 11.5*  HCT 38.7 36.4 38.0  MCV 85.8 86.3 88.8  MCH 27.9 27.5 26.9  MCHC 32.6 31.9 30.3  RDW 16.6* 16.6* 16.8*  PLT 249 242 241   Thyroid  Recent Labs  Lab 12/03/22 1141  TSH 0.846    BNP Recent Labs  Lab 12/03/22 0151  BNP 283.0*    DDimer  No results for input(s): "DDIMER" in the last 168 hours.   Radiology    DG Chest 2 View  Result Date: 12/04/2022 CLINICAL DATA:  Shortness of breath EXAM: CHEST - 2 VIEW COMPARISON:  12/03/2022 x-ray and older FINDINGS: Decreasing vascular congestion and right lung base opacity. More atelectatic changes of the left lung base today. No pneumothorax, effusion or consolidation. Stable cardiopericardial silhouette. Calcified aorta. Overlapping cardiac leads. IMPRESSION: Increasing left basilar atelectasis. Decreasing vascular congestion and right lung base hazy opacity. Recommend continued follow-up Electronically Signed   By: Karen Kays M.D.   On: 12/04/2022 15:10   ECHOCARDIOGRAM COMPLETE  Result Date: 12/03/2022    ECHOCARDIOGRAM REPORT   Patient Name:   Autumn [name] Binz Date of Exam: 12/03/2022 Medical Rec #:  [id number]       Height:       65.0 in Accession #:    [id number]      Weight:       242.0 lb Date of Birth:  Mar 02, 1956       BSA:          2.145 m Patient Age:    67 years        BP:           105/74 mmHg Patient  Gender: F               HR:           126 bpm. Exam Location:  Inpatient Procedure: 2D Echo, Color Doppler and Cardiac Doppler Indications:    CHF  History:        Patient has prior history of Echocardiogram examinations, most                 recent 01/12/2021. CHF; Arrythmias:Atrial Fibrillation.  Sonographer:    Milbert Coulter Referring Phys: [id number] DAVID GLICK IMPRESSIONS  1. Left ventricular ejection fraction, by estimation, is 40 to 45% in the setting of rapid atrial fbrillation with heart rate 125-130bpm.. The left ventricle has mildly decreased function. The left ventricle demonstrates global hypokinesis. The left ventricular internal cavity size was mildly dilated. Left ventricular diastolic function could not be evaluated.  2. Right ventricular systolic function is normal. The right ventricular size is normal.  3. Right atrial size was mildly dilated.  4. The mitral valve is degenerative. Mild mitral valve regurgitation. No evidence of mitral stenosis. Moderate mitral annular calcification.  5. The aortic valve is tricuspid. Aortic valve regurgitation is trivial. Aortic valve sclerosis/calcification is present, without any evidence of aortic stenosis.  6. The inferior vena cava is normal in size with greater than 50% respiratory variability, suggesting right atrial pressure of 3 mmHg.  7. Recommend repeat limited echo once heart rate controlled to more accurately assess LVF. FINDINGS  Left Ventricle: Left ventricular ejection fraction, by estimation, is 40 to 45%. The left ventricle has mildly decreased function. The left ventricle demonstrates global hypokinesis. The left ventricular internal cavity size was mildly dilated. There is  no left ventricular hypertrophy. Left ventricular diastolic function could not be evaluated due to atrial fibrillation. Left ventricular diastolic function could not be evaluated. Right Ventricle: The right ventricular size is normal. No increase in right ventricular wall thickness.  Right ventricular systolic function is normal. Left Atrium: Left atrial size was normal in size. Right At[facility]: Right atrial size was mildly dilated. Pericardium: There is no evidence of pericardial effusion. Mitral Valve: The mitral valve is degenerative in appearance. Moderate mitral annular calcification. Mild mitral valve  regurgitation. No evidence of mitral valve stenosis. Tricuspid Valve: The tricuspid valve is normal in structure. Tricuspid valve regurgitation is trivial. No evidence of tricuspid stenosis. Aortic Valve: The aortic valve is tricuspid. Aortic valve regurgitation is trivial. Aortic valve sclerosis/calcification is present, without any evidence of aortic stenosis. Pulmonic Valve: The pulmonic valve was normal in structure. Pulmonic valve regurgitation is not visualized. No evidence of pulmonic stenosis. Aorta: The aortic root is normal in size and structure. Venous: The inferior vena cava is normal in size with greater than 50% respiratory variability, suggesting right atrial pressure of 3 mmHg. IAS/Shunts: No atrial level shunt detected by color flow Doppler.  LEFT VENTRICLE PLAX 2D LVIDd:         5.70 cm LVIDs:         3.70 cm LV PW:         1.20 cm LV IVS:        1.10 cm LVOT diam:     2.00 cm LVOT Area:     3.14 cm  LV Volumes (MOD) LV vol d, MOD A2C: 64.3 ml LV vol d, MOD A4C: 80.1 ml LV vol s, MOD A2C: 37.0 ml LV vol s, MOD A4C: 53.7 ml LV SV MOD A2C:     27.3 ml LV SV MOD A4C:     80.1 ml LV SV MOD BP:      25.3 ml RIGHT VENTRICLE RV Basal diam:  2.90 cm RV Mid diam:    1.90 cm LEFT ATRIUM             Index        RIGHT ATRIUM           Index LA diam:        4.30 cm 2.00 cm/m   RA Area:     22.10 cm LA Vol (A2C):   74.5 ml 34.73 ml/m  RA Volume:   63.30 ml  29.51 ml/m LA Vol (A4C):   44.2 ml 20.61 ml/m LA Biplane Vol: 60.4 ml 28.16 ml/m   SHUNTS Systemic Diam: 2.00 cm Armanda Magic MD Electronically signed by Armanda Magic MD Signature Date/Time: 12/03/2022/2:05:47 PM    Final      Cardiac Studies   Echocardiogram 12/03/22 1. Left ventricular ejection fraction, by estimation, is 40 to 45% in the  setting of rapid atrial fbrillation with heart rate 125-130bpm.. The left  ventricle has mildly decreased function. The left ventricle demonstrates  global hypokinesis. The left  ventricular internal cavity size was mildly dilated. Left ventricular  diastolic function could not be evaluated.   2. Right ventricular systolic function is normal. The right ventricular  size is normal.   3. Right atrial size was mildly dilated.   4. The mitral valve is degenerative. Mild mitral valve regurgitation. No  evidence of mitral stenosis. Moderate mitral annular calcification.   5. The aortic valve is tricuspid. Aortic valve regurgitation is trivial.  Aortic valve sclerosis/calcification is present, without any evidence of  aortic stenosis.   6. The inferior vena cava is normal in size with greater than 50%  respiratory variability, suggesting right atrial pressure of 3 mmHg.   7. Recommend repeat limited echo once heart rate controlled to more  accurately assess LVF.   Patient Profile     67 y.o. female female with a hx of CAD status post stenting to LAD in 2020, chronic HFrEF, paroxysmal atrial fibrillation, moderate mitral regurgitation, PAD, COPD, Parkinson's, substance use, hypertension who is being seen 12/03/2022 for  the evaluation of A-fib RVR at the request of Dr. Jennette Kettle.   Assessment & Plan    Paroxysmal Atrial Fibrillation with RVR  - Patient has been followed by both [facility] and [facility] cardiology in the past. Afib seems permanent continue beta blocker increase dose to 100 mg daily   Acute on Chronic HFrEF  - Echocardiogram from [facility] in 08/2022 showed EF 40-45% with mild global hypokinesis, mild-moderately reduced RV systolic function  - no change in EF BNP minimally elevated  - Continue metoprolol succinate. BP soft try low dose ARB - no diuretic today    Mild mitral valve regurgitation  - Noted on echocardiogram this admission  - Only mild not audible on exam  Prolonged Qtc - Initial EKG showed Qtc 502 ms - Rechecking EKG this AM   HLD  - Lipid panel this admission showed LDL 109, HDL 58, triglycerides 65, total cholesterol 180  - Increase lipitor to 80 mg daily  - Will need LFTs and lipid panel in 8 weeks   CAD  - Previously had stent to LAD in 2020 - Continue metoprolol and lipitor - Not on ASA as she is on eliquis   Otherwise per primary  - Parkinson's disease - Polysubstance abuse- on sublingual suboxone. Reported using meth once weekly - COPD - Recent mechanical fall   For questions or updates, please contact [facility] Please consult [email or web address] for contact info under    Signed, Charlton Haws, MD  12/05/2022, 10:10 AM

## 2022-12-05 NOTE — Discharge Instructions (Signed)

## 2022-12-06 DIAGNOSIS — E785 Hyperlipidemia, unspecified: Secondary | ICD-10-CM

## 2022-12-06 DIAGNOSIS — I4891 Unspecified atrial fibrillation: Secondary | ICD-10-CM | POA: Diagnosis not present

## 2022-12-06 LAB — CBC WITH DIFFERENTIAL/PLATELET
Abs Immature Granulocytes: 0.03 10*3/uL (ref 0.00–0.07)
Basophils Absolute: 0 10*3/uL (ref 0.0–0.1)
Basophils Relative: 1 %
Eosinophils Absolute: 0.1 10*3/uL (ref 0.0–0.5)
Eosinophils Relative: 1 %
HCT: 37.4 % (ref 36.0–46.0)
Hemoglobin: 12.1 g/dL (ref 12.0–15.0)
Immature Granulocytes: 0 %
Lymphocytes Relative: 29 %
Lymphs Abs: 2.6 10*3/uL (ref 0.7–4.0)
MCH: 27.8 pg (ref 26.0–34.0)
MCHC: 32.4 g/dL (ref 30.0–36.0)
MCV: 85.8 fL (ref 80.0–100.0)
Monocytes Absolute: 0.6 10*3/uL (ref 0.1–1.0)
Monocytes Relative: 7 %
Neutro Abs: 5.5 10*3/uL (ref 1.7–7.7)
Neutrophils Relative %: 62 %
Platelets: 249 10*3/uL (ref 150–400)
RBC: 4.36 MIL/uL (ref 3.87–5.11)
RDW: 16.5 % — ABNORMAL HIGH (ref 11.5–15.5)
WBC: 8.9 10*3/uL (ref 4.0–10.5)
nRBC: 0 % (ref 0.0–0.2)

## 2022-12-06 LAB — BASIC METABOLIC PANEL
Anion gap: 11 (ref 5–15)
BUN: 15 mg/dL (ref 8–23)
CO2: 27 mmol/L (ref 22–32)
Calcium: 8.8 mg/dL — ABNORMAL LOW (ref 8.9–10.3)
Chloride: 101 mmol/L (ref 98–111)
Creatinine, Ser: 0.88 mg/dL (ref 0.44–1.00)
GFR, Estimated: >60 mL/min (ref >60)
Glucose, Bld: 97 mg/dL (ref 70–99)
Potassium: 4.5 mmol/L (ref 3.5–5.1)
Sodium: 139 mmol/L (ref 135–145)

## 2022-12-06 MED ORDER — GUAIFENESIN-DM 100-10 MG/5ML PO SYRP: 5.0000 mL | ORAL_SOLUTION | Freq: Four times a day (QID) | ORAL | 0 refills | Status: DC | PRN

## 2022-12-06 MED ORDER — METOPROLOL SUCCINATE ER 100 MG PO TB24: 100.0000 mg | ORAL_TABLET | Freq: Every day | ORAL | 0 refills | Status: DC

## 2022-12-06 MED ORDER — AZITHROMYCIN 500 MG PO TABS: 500.0000 mg | ORAL_TABLET | Freq: Every day | ORAL | 0 refills | Status: AC

## 2022-12-06 MED ORDER — ATORVASTATIN CALCIUM 80 MG PO TABS: 80.0000 mg | ORAL_TABLET | Freq: Every day | ORAL | 0 refills | Status: DC

## 2022-12-06 MED ORDER — DAPAGLIFLOZIN PROPANEDIOL 5 MG PO TABS: 10.0000 mg | ORAL_TABLET | Freq: Every day | ORAL | 0 refills | Status: DC

## 2022-12-06 MED ORDER — LOSARTAN POTASSIUM 25 MG PO TABS: 25.0000 mg | ORAL_TABLET | Freq: Every day | ORAL | 0 refills | Status: DC

## 2022-12-06 MED ORDER — MELATONIN 3 MG PO TABS: 3.0000 mg | ORAL_TABLET | Freq: Every evening | ORAL | Status: DC | PRN

## 2022-12-06 MED ORDER — MELATONIN 3 MG PO TABS: 3.0000 mg | ORAL_TABLET | Freq: Every evening | ORAL | 0 refills | Status: DC | PRN

## 2022-12-06 MED ORDER — PROCHLORPERAZINE MALEATE 5 MG PO TABS
5.0000 mg | ORAL_TABLET | Freq: Four times a day (QID) | ORAL | Status: DC | PRN
  Administered 2022-12-06 – 2022-12-07 (×2): 5 mg via ORAL
  Filled 2022-12-06 (×3): qty 1

## 2022-12-06 NOTE — Hospital Course (Addendum)
Autumn [name] is a 67 y.o. female presenting with A-fib with RVR int he setting of medication non-adherence and stimulant use. She has a PMHx significant for CAD with stent in 2020, HTN, permanent a-fib, HLD, parkinson's, systolic HF, COPD, and polysubstance useHospital course is outlined below by system.   Atrial fibrillation with RVR In A-fib with RVR  on admission with HR up to 160s, UDS found to be positive for recent amphetamine and cannabis use. Started on IV Dilts gtt in the ED with improved heart rate down to 120-130s, with persistent hypotension MAPs <65. Cardiology consulted who discontinued diltiazem considering reduced EF 40-45%.  Vitals are stabilizing with metoprolol XL, which was titrated from 50 mg to 100 mg by day of discharge.  Home Eliquis was continued.  At the time of assessment, patient reports candidate for digoxin given concerns of noncompliance.  Amiodarone was also held due to prolonged QTc of 502 on initial EKG.  Per cards, patient is also poor candidate for cardioversion given her ongoing methamphetamine use.  Extensive discussions were had during hospitalization regarding abstaining from stimulant use, patient understands continued drug use will worsen her atrial fibrillation.  I time of discharge her A-fib atrial fibrillation was well-controlled, no longer in RVR.  Parkinson's disease Diagnoses couple of months ago and follows with neurologist out patient on Sinemet.  Home Sinemet was continued.  Remained stable throughout hospitalization.  Congestive heart failure (CHF) Patient unaware of CHF diagnosis. Chart review shows a history of HFpEF with worsening EF 40-45% (Jan, 2024) with mild global hypokinesis severe of LV (per care everywhere note). Echo result from 2 years ago showed EF of 60-65%.  Initial exam was significant for  +1 BLE edema with signs of Cardiomegaly on CXR and BNP of 283 on admission.  Volume status improving s/p one-time dose of IV Lasix 40 mg, diuresing  total of 2.5 L.   Acute hypoxic respiratory failure On exam have intermittent bibasilar wheezing which could be a chronic finding in known COPD patient who continue to use tobacco.  COPD exacerbation initially unclear, with repeat CXR showing decreasing vascular congestion.  Initially on 2 L nasal cannula (which is home amount) and was on room air on day of discharge.   Polysubstance abuse History of opioid use.  On sublingual Suboxone 8-2 mg TID, which was restarted on admission.  Stable on day of discharge   Fall Patient had a reported fall without LOC a little over a week and denies any trauma to the head.  CT head negative.  On exam have ecchymosis inferior to the right knee.  Stable on day of discharge.  PCP follow-up recommendations: Outpatient follow-up for management of COPD nebulizers and reassess home oxygen needs Consider outpatient referral to cardiologist for continued management of CHF Consider repeat  lipid panel in 3 months

## 2022-12-06 NOTE — Discharge Summary (Incomplete)
Family Medicine Teaching [facility] Discharge Summary  Patient name: Autumn [name] Medical record number: [id number] Date of birth: 26-Jun-1956 Age: 67 y.o. Gender: female Date of Admission: 12/03/2022  Date of Discharge: 12/07/22  Admitting Physician: Nestor Ramp, MD  Primary Care Provider: No primary care provider on file. Consultants: Cardiology  Indication for Hospitalization: A-fib with RVR  Discharge Diagnoses/Problem List:  Principal Problem for Admission:  Other Problems addressed during stay:  Principal Problem:   Atrial fibrillation with rapid ventricular response (HCC) Active Problems:   COPD (chronic obstructive pulmonary disease) (HCC)   Congestive heart failure (CHF) (HCC)   Polysubstance abuse (HCC)   Parkinson's disease   A-fib (HCC)   Elevated brain natriuretic peptide (BNP) level   Elevated random blood glucose level   Shortness of breath   PAD (peripheral artery disease) (HCC)   CAD (coronary artery disease) S/P stent to LAD   HLD (hyperlipidemia)  Brief Hospital Course:  Autumn [name] is a 67 y.o. female presenting with A-fib with RVR int he setting of medication non-adherence and stimulant use. She has a PMHx significant for CAD with stent in 2020, HTN, permanent a-fib, HLD, parkinson's, systolic HF, COPD, and polysubstance useHospital course is outlined below by system.   Atrial fibrillation with RVR In A-fib with RVR  on admission with HR up to 160s, UDS found to be positive for recent amphetamine and cannabis use. Started on IV Dilts gtt in the ED with improved heart rate down to 120-130s, with persistent hypotension MAPs <65. Cardiology consulted who discontinued diltiazem considering reduced EF 40-45%.  Vitals are stabilizing with metoprolol XL, which was titrated from 50 mg to 100 mg by day of discharge.  Home Eliquis was continued.  At the time of assessment, patient reports candidate for digoxin given concerns of noncompliance.  Amiodarone was  also held due to prolonged QTc of 502 on initial EKG.  Per cards, patient is also poor candidate for cardioversion given her ongoing methamphetamine use.  Extensive discussions were had during hospitalization regarding abstaining from stimulant use, patient understands continued drug use will worsen her atrial fibrillation.  I time of discharge her A-fib atrial fibrillation was well-controlled, no longer in RVR.  Parkinson's disease Diagnoses couple of months ago and follows with neurologist out patient on Sinemet.  Home Sinemet was continued.  Remained stable throughout hospitalization.  Congestive heart failure (CHF) Patient unaware of CHF diagnosis. Chart review shows a history of HFpEF with worsening EF 40-45% (Jan, 2024) with mild global hypokinesis severe of LV (per care everywhere note). Echo result from 2 years ago showed EF of 60-65%.  Initial exam was significant for  +1 BLE edema with signs of Cardiomegaly on CXR and BNP of 283 on admission.  Volume status improving s/p one-time dose of IV Lasix 40 mg, diuresing total of 2.5 L.   Acute hypoxic respiratory failure On exam have intermittent bibasilar wheezing which could be a chronic finding in known COPD patient who continue to use tobacco.  COPD exacerbation initially unclear, with repeat CXR showing decreasing vascular congestion.  Initially on 2 L nasal cannula (which is home amount) and was on room air on day of discharge.   Polysubstance abuse History of opioid use.  On sublingual Suboxone 8-2 mg TID, which was restarted on admission.  Stable on day of discharge   Fall Patient had a reported fall without LOC a little over a week and denies any trauma to the head.  CT head negative.  On exam have ecchymosis inferior to the right knee.  Stable on day of discharge.  PCP follow-up recommendations: Outpatient follow-up for management of COPD nebulizers and reassess home oxygen needs Consider outpatient referral to cardiologist for  continued management of CHF Consider repeat  lipid panel in 3 months     Disposition: Home  Discharge Condition: Stable    Discharge Exam: Per Dr. Theodis Aguas Vitals:   12/07/22 1007 12/07/22 1051  BP: (!) 123/95   Pulse: (!) 158 96  Resp: 19   Temp:    SpO2: 97% 94%   General: Somnolent but easily arousable, morbidly obese, NAD CV:  Irregularly irregular, no murmurs, normal S1/S2 Pulm: CTAB with intermittent bibasilar wheezing, good WOB onRA Abd: Soft, no distension, no tenderness Skin: Ecchymosis on the RLE inferior to the knee Ext:  LE edema improved Neuro: CN II-XII intact, No focal deficit   Significant Procedures: None  Significant Labs and Imaging:  Recent Labs  Lab 12/06/22 0142  WBC 8.9  HGB 12.1  HCT 37.4  PLT 249   Recent Labs  Lab 12/06/22 0142  NA 139  K 4.5  CL 101  CO2 27  GLUCOSE 97  BUN 15  CREATININE 0.88  CALCIUM 8.8*    DG Chest 2 View  Result Date: 12/04/2022 CLINICAL DATA:  Shortness of breath EXAM: CHEST - 2 VIEW COMPARISON:  12/03/2022 x-ray and older FINDINGS: Decreasing vascular congestion and right lung base opacity. More atelectatic changes of the left lung base today. No pneumothorax, effusion or consolidation. Stable cardiopericardial silhouette. Calcified aorta. Overlapping cardiac leads. IMPRESSION: Increasing left basilar atelectasis. Decreasing vascular congestion and right lung base hazy opacity. Recommend continued follow-up Electronically Signed   By: Karen Kays M.D.   On: 12/04/2022 15:10    Results/Tests Pending at Time of Discharge: None  Discharge Medications:  Allergies as of 12/07/2022       Reactions   Nortriptyline Hcl Anaphylaxis   Doxycycline Other (See Comments)        Medication List     STOP taking these medications    albuterol (2.5 MG/3ML) 0.083% nebulizer solution Commonly known as: PROVENTIL   diltiazem 120 MG 24 hr capsule Commonly known as: Cardizem CD   lisinopril 2.5 MG  tablet Commonly known as: ZESTRIL   omeprazole 20 MG capsule Commonly known as: PRILOSEC       TAKE these medications    acetaminophen 325 MG tablet Commonly known as: TYLENOL Take 325 mg by mouth continuous as needed for mild pain or moderate pain (Patient takes as needed for pain).   Albuterol Sulfate (sensor) 108 (90 Base) MCG/ACT Aepb Inhale 108 mcg into the lungs as needed (Patient uses as needed).   ARIPiprazole 10 MG tablet Commonly known as: ABILIFY Take 1 tablet (10 mg total) by mouth daily.   atorvastatin 80 MG tablet Commonly known as: LIPITOR Take 1 tablet (80 mg total) by mouth daily. What changed:  medication strength how much to take   azithromycin 500 MG tablet Commonly known as: ZITHROMAX Take 1 tablet (500 mg total) by mouth daily for 1 dose.   Breztri Aerosphere 160-9-4.8 MCG/ACT Aero Generic drug: Budeson-Glycopyrrol-Formoterol Inhale 2 puffs into the lungs 2 (two) times daily.   buprenorphine-naloxone 8-2 mg Subl SL tablet Commonly known as: SUBOXONE Place 1 tablet daily under the tongue.   busPIRone 10 MG tablet Commonly known as: BUSPAR Take 10 mg by mouth 2 (two) times daily.   carbidopa-levodopa 25-100 MG tablet Commonly known as:  SINEMET IR Take 1 tablet by mouth 3 (three) times daily.   cloNIDine 0.1 MG tablet Commonly known as: CATAPRES Take 0.1 mg by mouth 2 (two) times daily.   cyclobenzaprine 10 MG tablet Commonly known as: FLEXERIL Take 10 mg by mouth at bedtime as needed for muscle spasms.   dapagliflozin propanediol 5 MG Tabs tablet Commonly known as: Farxiga Take 2 tablets (10 mg total) by mouth daily.   diclofenac Sodium 1 % Gel Commonly known as: VOLTAREN Apply 2-4 g topically 4 (four) times daily as needed (pain).   DULoxetine 60 MG capsule Commonly known as: CYMBALTA Take 1 capsule (60 mg total) by mouth daily.   Eliquis 5 MG Tabs tablet Generic drug: apixaban Take 1 tablet (5 mg total) by mouth in the  morning and at bedtime.   fluticasone 50 MCG/ACT nasal spray Commonly known as: FLONASE Place 2 sprays into both nostrils daily.   furosemide 40 MG tablet Commonly known as: LASIX Take 40 mg by mouth daily.   gabapentin 600 MG tablet Commonly known as: NEURONTIN Take 600 mg by mouth 3 (three) times daily.   guaiFENesin-dextromethorphan 100-10 MG/5ML syrup Commonly known as: ROBITUSSIN DM Take 5 mLs by mouth every 6 (six) hours as needed for cough.   Incruse Ellipta 62.5 MCG/ACT Aepb Generic drug: umeclidinium bromide Inhale 1 puff into the lungs daily.   losartan 25 MG tablet Commonly known as: COZAAR Take 1 tablet (25 mg total) by mouth daily.   melatonin 3 MG Tabs tablet Take 1 tablet (3 mg total) by mouth at bedtime as needed.   metoprolol succinate 100 MG 24 hr tablet Commonly known as: TOPROL-XL Take 1 tablet (100 mg total) by mouth at bedtime. Take with or immediately following a meal. What changed:  medication strength how much to take when to take this additional instructions   multivitamin with minerals Tabs tablet Take 1 tablet by mouth every morning.   naloxone 4 MG/0.1ML Liqd nasal spray kit Commonly known as: NARCAN Place 4 mg into the nose as needed (overdose, as needed).   oxybutynin 10 MG 24 hr tablet Commonly known as: DITROPAN-XL Take 10 mg by mouth daily.   pantoprazole 40 MG tablet Commonly known as: Protonix Take 1 tablet (40 mg total) by mouth daily.   prochlorperazine 5 MG tablet Commonly known as: COMPAZINE Take 1 tablet (5 mg total) by mouth every 6 (six) hours as needed for nausea or vomiting.               Durable Medical Equipment  (From admission, onward)           Start     Ordered   12/07/22 1323  For home use only DME 4 wheeled rolling walker with seat  Once       Question:  Patient needs a walker to treat with the following condition  Answer:  General weakness   12/07/22 1324            Discharge  Instructions: Please refer to Patient Instructions section of EMR for full details.  Patient was counseled important signs and symptoms that should prompt return to medical care, changes in medications, dietary instructions, activity restrictions, and follow up appointments.   Follow-Up Appointments:  Follow-up Information     [facility] Follow up.   Why: Rolling Walker with seat- to be delivered via [employer]. Contact information: [address] [place] [address] [phone]         [facility]. Go on 12/17/2022.   Why: Apt at 8:30 am to establish care as new patient. Contact information: [address] [place] [address] [phone]                Evette Georges, MD 12/07/2022, 3:41 PM PGY-1, [facility] Family Medicine   I reviewed the medical decision making and verified the service and findings are accurately documented in the intern's note.  Erick Alley, DO 12/07/2022 5:50 PM

## 2022-12-06 NOTE — Plan of Care (Signed)

## 2022-12-06 NOTE — Assessment & Plan Note (Addendum)
LIPID Panel: isolated elevated LDL 109. - Lipitor 80 mg QD

## 2022-12-06 NOTE — Progress Notes (Signed)
Rounding Note    Patient Name: Autumn [name] Date of Encounter: 12/06/2022  [facility] Cardiologist: Thomasene Ripple, DO (new this admission)   Subjective   No cardiac complaints   Inpatient Medications    Scheduled Meds:  apixaban  5 mg Oral BID   atorvastatin  80 mg Oral Daily   azithromycin  500 mg Oral Daily   budesonide  0.25 mg Nebulization BID   buprenorphine-naloxone  1 tablet Sublingual TID   carbidopa-levodopa  1 tablet Oral TID   dapagliflozin propanediol  10 mg Oral Daily   fluticasone  1 spray Each Nare Daily   losartan  25 mg Oral Daily   metoprolol succinate  100 mg Oral QHS   umeclidinium-vilanterol  1 puff Inhalation Daily   Continuous Infusions:  PRN Meds: benzonatate, guaiFENesin-dextromethorphan, ipratropium-albuterol   Vital Signs    Vitals:   12/06/22 0447 12/06/22 0453 12/06/22 0758 12/06/22 0823  BP: 111/87  (!) 102/45   Pulse: 98  60 (!) 105  Resp: 20  16 18   Temp: 98.7 F (37.1 C)     TempSrc: Oral  Oral   SpO2: 91% 95% 94% 100%  Weight:      Height:       No intake or output data in the 24 hours ending 12/06/22 0912     12/05/2022    3:13 AM 12/04/2022    5:01 AM 12/03/2022    1:44 AM  Last 3 Weights  Weight (lbs) 219 lb 232 lb 9.4 oz 242 lb  Weight (kg) 99.338 kg 105.5 kg 109.77 kg      Telemetry    Atrial fibrillation, HR in the 90s-1001 - Personally Reviewed  ECG    Atrial fibrillation with HR 147 BPM - Personally Reviewed  Physical Exam   GEN: No acute distress.  Sitting upright on the side of the bed eating breakfast  Neck: No JVD Cardiac: Irregular rate and rhythm. no murmurs, rubs, or gallops.  Respiratory: Crackles in bilateral lung bases. Also had crackles in upper lung bases that cleared with cough. Occasional expiratory wheezing  GI: Soft, nontender, non-distended  MS: Trace edema in BLE; No deformity. Neuro:  Nonfocal  Psych: Normal affect   Labs    High Sensitivity Troponin:   Recent Labs   Lab 12/03/22 1438 12/03/22 1552  TROPONINIHS 18* 17     Chemistry Recent Labs  Lab 12/03/22 0151 12/04/22 0310 12/05/22 0230 12/06/22 0142  NA 138 134* 136 139  K 3.5 4.7 4.1 4.5  CL 103 101 102 101  CO2 25 24 27 27   GLUCOSE 158* 125* 96 97  BUN 5* 10 16 15   CREATININE 0.85 0.86 0.84 0.88  CALCIUM 8.9 9.2 8.8* 8.8*  MG 1.9  --   --   --   GFRNONAA >60 >60 >60 >60  ANIONGAP 10 9 7 11     Lipids  Recent Labs  Lab 12/03/22 1552  CHOL 180  TRIG 65  HDL 58  LDLCALC 109*  CHOLHDL 3.1    Hematology Recent Labs  Lab 12/04/22 0310 12/05/22 0230 12/06/22 0142  WBC 14.7* 9.5 8.9  RBC 4.22 4.28 4.36  HGB 11.6* 11.5* 12.1  HCT 36.4 38.0 37.4  MCV 86.3 88.8 85.8  MCH 27.5 26.9 27.8  MCHC 31.9 30.3 32.4  RDW 16.6* 16.8* 16.5*  PLT 242 241 249   Thyroid  Recent Labs  Lab 12/03/22 1141  TSH 0.846    BNP Recent Labs  Lab  12/03/22 0151  BNP 283.0*    DDimer No results for input(s): "DDIMER" in the last 168 hours.   Radiology    DG Chest 2 View  Result Date: 12/04/2022 CLINICAL DATA:  Shortness of breath EXAM: CHEST - 2 VIEW COMPARISON:  12/03/2022 x-ray and older FINDINGS: Decreasing vascular congestion and right lung base opacity. More atelectatic changes of the left lung base today. No pneumothorax, effusion or consolidation. Stable cardiopericardial silhouette. Calcified aorta. Overlapping cardiac leads. IMPRESSION: Increasing left basilar atelectasis. Decreasing vascular congestion and right lung base hazy opacity. Recommend continued follow-up Electronically Signed   By: Karen Kays M.D.   On: 12/04/2022 15:10    Cardiac Studies   Echocardiogram 12/03/22 1. Left ventricular ejection fraction, by estimation, is 40 to 45% in the  setting of rapid atrial fbrillation with heart rate 125-130bpm.. The left  ventricle has mildly decreased function. The left ventricle demonstrates  global hypokinesis. The left  ventricular internal cavity size was mildly  dilated. Left ventricular  diastolic function could not be evaluated.   2. Right ventricular systolic function is normal. The right ventricular  size is normal.   3. Right atrial size was mildly dilated.   4. The mitral valve is degenerative. Mild mitral valve regurgitation. No  evidence of mitral stenosis. Moderate mitral annular calcification.   5. The aortic valve is tricuspid. Aortic valve regurgitation is trivial.  Aortic valve sclerosis/calcification is present, without any evidence of  aortic stenosis.   6. The inferior vena cava is normal in size with greater than 50%  respiratory variability, suggesting right atrial pressure of 3 mmHg.   7. Recommend repeat limited echo once heart rate controlled to more  accurately assess LVF.   Patient Profile     67 y.o. female female with a hx of CAD status post stenting to LAD in 2020, chronic HFrEF, paroxysmal atrial fibrillation, moderate mitral regurgitation, PAD, COPD, Parkinson's, substance use, hypertension who is being seen 12/03/2022 for the evaluation of A-fib RVR at the request of Dr. Jennette Kettle.   Assessment & Plan    Paroxysmal Atrial Fibrillation with RVR  - Patient has been followed by both [facility] and [facility] cardiology in the past. Afib seems permanent continue beta blocker increase dose to 100 mg daily   Acute on Chronic HFrEF  - Echocardiogram from [facility] in 08/2022 showed EF 40-45% with mild global hypokinesis, mild-moderately reduced RV systolic function  - no change in EF BNP minimally elevated  - Continue metoprolol succinate. BP soft try low dose ARB - no diuretic today   Mild mitral valve regurgitation  - Noted on echocardiogram this admission  - Only mild not audible on exam  Prolonged Qtc - Initial EKG showed Qtc 502 ms - Rechecking EKG this AM   HLD  - Lipid panel this admission showed LDL 109, HDL 58, triglycerides 65, total cholesterol 180  - Increase lipitor to 80 mg daily  - Will need LFTs and  lipid panel in 8 weeks   CAD  - Previously had stent to LAD in 2020 - Continue metoprolol and lipitor - Not on ASA as she is on eliquis   Otherwise per primary  - Parkinson's disease - Polysubstance abuse- on sublingual suboxone. Reported using meth once weekly on Suboxone  - COPD - Recent mechanical fall    Cardiology will sign off   For questions or updates, please contact [facility] Please consult [email or web address] for contact info under    Signed, Theron Arista  Eden Emms, MD  12/06/2022, 9:12 AM

## 2022-12-06 NOTE — Progress Notes (Signed)
Daily Progress Note Intern Pager: [phone]  Patient name: Autumn [name] Medical record number: [id number] Date of birth: 12/25/1955 Age: 67 y.o. Gender: female  Primary Care Provider: No primary care provider on file. Consultants: Cardiology Code Status: Full  Pt Overview and Major Events to Date:  4/25-admitted  Assessment and Plan: Autumn [name] is a 67 y.o. female presenting with A-fib with RVR. Unclear as to etiology of her A-fib with RVR other possible causes of AFIB include thyroid disease, heart failure, cocaine use or infectious process.  PMHx includes CAD with stent in 2020, HTN, A-fib, HLD, Parkinson's, systolic HFrEF, COPD, substance use disorder, GERD  * Atrial fibrillation with rapid ventricular response (HCC) Initially in a fib with RVR in the setting of recent stimulant use.  UDS positive for amphetamines and cannabis. Pt still in a-fib w/ HR in low 100-1-teens over night on Toprol XL 100 mg.  Per cards she is not a candidate for amiodarone given her prolonged QTc and not candidate for cardioversion due to substance use and medication noncompliance. -Cardiology on board, appreciate recs -Continue metoprolol succinate to 100 mg nightly per cardiology -Continue home Eliquis 5 mg twice daily  HLD (hyperlipidemia) LIPID Panel: isolated elevated LDL 109. - Lipitor 80 mg QD  Parkinson's disease Diagnoses couple of months ago and follows with neurologist out patient. Was started on Sinemet. -Continue Sinemet once med rec completed.  Congestive heart failure (CHF) (HCC) TTE showing LVEF 40-45%, unchanged from prior in Jan 2024. CXR yesterday showing decreasing vascular congestion. UOP 2.361 L overnight. Cardiology recommend avoiding diltiazem in setting of reduced EF.  - Cardiology following, appreciate recs - Continue Toprol-XL 100 mg daily - Losartan 25 mg daily starting today per cardiology - Daily I/Os - Daily weights  COPD (chronic obstructive pulmonary  disease) (HCC) Oxygen requirements unchanged, remains on 2L [place]. ABX w/ azithromycin, holding prednisone treatment. Remains afebrile, reports cough has improved.  \ -O2 sat goal 88-92% -Continue Anoro Ellipta inhaler and budesonide nebulizer -DuoNebs every 4 hours as needed -Azithromycin 500 mg daily(day 1/3) -Continue Tessalon Perles and Robitussin as needed per patient request -Encourage smoking cessation   Polysubstance abuse (HCC) UDS positive for amphetamines and cannabis.  History of opioid use.  On sublingual Suboxone 8-2 mg TID. -Continue home Suboxone. -TOC consulted for substance abuse counseling    FEN/GI: Heart healthy PPx: Eliquis Dispo: Pending continued medical management  Subjective:  Patient evaluated bedside, reports that she is tired, reports poor sleep overnight.  Is amenable to starting melatonin as needed nightly.  No other acute concerns.  Remains on 2 L nasal cannula.  Objective: Temp:  [98.3 F (36.8 C)-98.7 F (37.1 C)] 98.7 F (37.1 C) (04/28 0447) Pulse Rate:  [60-109] 97 (04/28 1157) Resp:  [16-20] 18 (04/28 0823) BP: (93-121)/(45-92) 108/87 (04/28 1157) SpO2:  [91 %-100 %] 94 % (04/28 1157) Weight:  [95.9 kg] 95.9 kg (04/28 0943) Physical Exam:  General: Patient sitting up in bed, NAD Cardiovascular: Irregularly irregular Respiratory: normal work of breathing, rhonchorous breath sounds improved at bases, on 2L [place] Extremities: 2+ pitting edema of RLE, trace pitting edema of LLE  Laboratory: Most recent CBC Lab Results  Component Value Date   WBC 8.9 12/06/2022   HGB 12.1 12/06/2022   HCT 37.4 12/06/2022   MCV 85.8 12/06/2022   PLT 249 12/06/2022   Most recent BMP    Latest Ref Rng & Units 12/06/2022    1:42 AM  BMP  Glucose 70 -  99 mg/dL 97   BUN 8 - 23 mg/dL 15   Creatinine 5.78 - 1.00 mg/dL 4.69   Sodium 629 - 528 mmol/L 139   Potassium 3.5 - 5.1 mmol/L 4.5   Chloride 98 - 111 mmol/L 101   CO2 22 - 32 mmol/L 27   Calcium 8.9 -  10.3 mg/dL 8.8      Lorri Frederick, MD 12/06/2022, 1:05 PM PGY-1, [facility] Family Medicine FPTS Intern pager: [phone], text pages welcome Secure chat group [facility] Teaching Service

## 2022-12-07 DIAGNOSIS — J42 Unspecified chronic bronchitis: Secondary | ICD-10-CM

## 2022-12-07 MED ORDER — PROCHLORPERAZINE MALEATE 5 MG PO TABS: 5.0000 mg | ORAL_TABLET | Freq: Four times a day (QID) | ORAL | 0 refills | Status: DC | PRN

## 2022-12-07 NOTE — Progress Notes (Signed)
   Heart Failure Stewardship Pharmacist Progress Note   PCP: No primary care provider on file. PCP-Cardiologist: Thomasene Ripple, DO    HPI:  67 yo F with PMH of CHF, HTN, afib s/p multiple failed cardioversions, CAD, HLD, PAD, COPD, Parkinson's disease, and polysubstance use.  Presented to the ED on 4/25 with shortness of breath, palpitations, and chest discomfort. CXR with cardiomegaly and possible edema vs infiltrate. Found to be in afib RVR. Started on IV diltiazem which was later discontinued given hypotension. Cardiology was consulted and started on BB. Due to concerns of noncompliance, QTc prolongation, and substance use, deferring use of digoxin or amiodarone. ECHO showed EF 40-45% (unchanged from Jan 2024, was 60-65% in 01/2021). Repeat CXR with increasing L basilar atelectasis and decreasing vascular congestion.  Current HF Medications: Beta Blocker: metoprolol XL 100 mg daily ACE/ARB/ARNI: losartan 25 mg daily SGLT2i: Farxiga 10 mg daily  Prior to admission HF Medications: Diuretic: furosemide 40 mg daily Beta blocker: metoprolol XL 200 mg daily ACE/ARB/ARNI: lisinopril 2.5 mg daily  Pertinent Lab Values: Serum creatinine 0.88, BUN 15, Potassium 4.5, Sodium 139, BNP 283, Magnesium 1.9  Vital Signs: Weight: 214 lbs (admission weight: estimated 242 lbs) Blood pressure: 120/80s  Heart rate: 90-100s  I/O: -0.8L yesterday; net -3.2L  Medication Assistance / Insurance Benefits Check: Does the patient have prescription insurance?  Yes Type of insurance plan: [place] Medicaid  Outpatient Pharmacy:  Prior to admission outpatient pharmacy: [facility] Is the patient willing to use [facility] TOC pharmacy at discharge? Yes Is the patient willing to transition their outpatient pharmacy to utilize a [facility] outpatient pharmacy?   Pending    Assessment: 1. Acute on chronic systolic and diastolic CHF (LVEF 40-45%), due to NICM. NYHA class II-III symptoms. - Off IV lasix. Strict I/Os  and daily weights.  - Continue metoprolol XL 100 mg daily - Continue losartan 25 mg daily - Consider starting spironolactone prior to discharge vs at follow up - Continue Farxiga 10 mg daily   Plan: 1) Medication changes recommended at this time: - Continue current regimen  2) Patient assistance: - Jardiance/Farxiga copay $0 - Entresto copay $0  3)  Education  - Patient has been educated on current HF medications and potential additions to HF medication regimen - Patient verbalizes understanding that over the next few months, these medication doses may change and more medications may be added to optimize HF regimen - Patient has been educated on basic disease state pathophysiology and goals of therapy   Sharen Hones, PharmD, BCPS Heart Failure Stewardship Pharmacist Phone [phone]

## 2022-12-07 NOTE — TOC Initial Note (Addendum)
Transition of Care [facility]) - Initial/Assessment Note    Patient Details  Name: Autumn [name] MRN: [id number] Date of Birth: February 11, 1956  Transition of Care [facility]) CM/SW Contact:    Gala Lewandowsky, RN Phone Number: 12/07/2022, 1:29 PM  Clinical Narrative:  Risk for readmission assessment completed. Case Manager ordered a rolling walker with seat for the patient via [employer]. DME will be delivered to the room. CSW has met with the patient for resources. Patient has a plan for disposition. No further needs identified.    1355 12-07-22 Case Manager received notification from [employer]; patient received a rolling walker in 2022; insurance unable to pay for a new rollator. Patient is aware she can pay out of pocket; however she cannot afford $195.00. No further needs identified at this time.                  Expected Discharge Plan: Home/Self Care Barriers to Discharge: No Barriers Identified   Patient Goals and CMS Choice Patient states their goals for this hospitalization and ongoing recovery are:: to return home.   Choice offered to / list presented to : NA      Expected Discharge Plan and Services In-house Referral: Clinical Social Work Discharge Planning Services: CM Consult Post Acute Care Choice: NA Living arrangements for the past 2 months: Single Family Home Expected Discharge Date: 12/06/22               DME Arranged: Dan Humphreys rolling with seat DME Agency: [employer] Date DME Agency Contacted: 12/07/22 Time DME Agency Contacted: 1328 Representative spoke with at DME Agency: Barbara Cower  Prior Living Arrangements/Services Living arrangements for the past 2 months: Single Family Home Lives with::  (plan to return to families home) Patient language and need for interpreter reviewed:: Yes        Need for Family Participation in Patient Care: No (Comment) Care giver support system in place?: No (comment)   Criminal Activity/Legal Involvement Pertinent to Current  Situation/Hospitalization: No - Comment as needed  Activities of Daily Living Home Assistive Devices/Equipment: None ADL Screening (condition at time of admission) Patient's cognitive ability adequate to safely complete daily activities?: Yes Is the patient deaf or have difficulty hearing?: No Does the patient have difficulty seeing, even when wearing glasses/contacts?: No Does the patient have difficulty concentrating, remembering, or making decisions?: No Patient able to express need for assistance with ADLs?: Yes Does the patient have difficulty dressing or bathing?: No Independently performs ADLs?: Yes (appropriate for developmental age) Does the patient have difficulty walking or climbing stairs?: Yes Weakness of Legs: Both Weakness of Arms/Hands: None  Permission Sought/Granted Permission sought to share information with : Case Manager, Photographer granted to share info w AGENCY: [employer]        Emotional Assessment Appearance:: Appears stated age         Psych Involvement: No (comment)  Admission diagnosis:  Shortness of breath [R06.02] A-fib (HCC) [I48.91] Atrial fibrillation with rapid ventricular response (HCC) [I48.91] Elevated brain natriuretic peptide (BNP) level [R79.89] Elevated random blood glucose level [R73.09] Patient Active Problem List   Diagnosis Date Noted   HLD (hyperlipidemia) 12/06/2022   COPD (chronic obstructive pulmonary disease) (HCC) 12/03/2022   Congestive heart failure (CHF) (HCC) 12/03/2022   Parkinson's disease 12/03/2022   A-fib (HCC) 12/03/2022   Elevated brain natriuretic peptide (BNP) level 12/03/2022   Elevated random blood glucose level 12/03/2022   Shortness of breath 12/03/2022   PAD (  peripheral artery disease) (HCC) 12/03/2022   CAD (coronary artery disease) S/P stent to LAD 12/03/2022   Atrial fibrillation with rapid ventricular response (HCC) 01/11/2021   Tobacco dependence 01/11/2021    Homelessness 01/11/2021   Alcohol abuse 06/27/2017   Alcohol dependence with uncomplicated withdrawal (HCC) 06/27/2017   Polysubstance abuse (HCC) 06/27/2017   Cocaine abuse (HCC) 06/27/2017   Depression 06/27/2017   Essential hypertension 06/27/2017   GERD (gastroesophageal reflux disease) 06/27/2017   Chest pain 06/26/2017   Attention deficit disorder (ADD)    Drug abuse (HCC)    SINUSITIS, ACUTE 10/11/2007   ONYCHOMYCOSIS, TOENAILS 06/28/2007   HEPATITIS C 05/26/2007   SUBSTANCE ABUSE, MULTIPLE 05/26/2007   CIRRHOSIS 05/26/2007   LOW BACK PAIN SYNDROME 05/26/2007   TROCHANTERIC BURSITIS, LEFT 05/26/2007   INSOMNIA 05/13/2007   HOARSENESS, CHRONIC 05/13/2007   CARPAL TUNNEL SYNDROME, BILATERAL 12/02/2006   DIVERTICULOSIS, COLON 06/01/2006   RECTAL BLEEDING, HX OF 04/10/2006   PCP:  No primary care provider on file. Pharmacy:   [facility] [place], [place] - [address] [place] [address] Phone: [phone] Fax: [phone]  [facility] [address] [place] [address] Phone: [phone] Fax: [phone]  [id number] [place], [place] - [address] AT [facility] [address] [facility] [address] [place] [address] Phone: [phone] Fax: [phone]     Social Determinants of Health (SDOH) Social History: SDOH Screenings   Tobacco Use: High Risk (12/03/2022)   Readmission Risk Interventions    12/07/2022    1:27 PM  Readmission Risk Prevention Plan  Transportation Screening Complete  PCP or Specialist Appt within 3-5 Days Complete  HRI or Home Care Consult Complete  Social Work Consult for Recovery Care Planning/Counseling Complete  Palliative Care Screening Not Applicable  Medication Review Oceanographer) Complete

## 2022-12-07 NOTE — Care Management Important Message (Signed)
Important Message  Patient Details  Name: Autumn [name] MRN: [id number] Date of Birth: 03/19/1956   Medicare Important Message Given:  Yes     [name] 12/07/2022, 12:09 PM

## 2022-12-07 NOTE — Progress Notes (Signed)
Mobility Specialist Progress Note   12/07/22 1350  Mobility  Activity Ambulated with assistance in room;Dangled on edge of bed  Level of Assistance Standby assist, set-up cues, supervision of patient - no hands on  Assistive Device None  Distance Ambulated (ft) 20 ft  Range of Motion/Exercises Active;All extremities  Activity Response Tolerated well   Patient received standing in doorway requesting assistance for pericare. Bed linen and briefs saturated from urine incontinence. Performed pericare independently and ambulated short distance in room with supervision. Soiled linen was changed then patient returned to bed without complaint or incident. Was left in supine with all needs met, call bell in reach.   Autumn [name], BS EXP Mobility Specialist Please contact via SecureChat or Rehab office at [phone]

## 2022-12-07 NOTE — Progress Notes (Addendum)
CSW spoke with patient at bedside. Patient reports PTA she comes from home with Grandson. CSW received consult for substance use resources and shelter list resources. CSW offered patient outpatient substance use treatment services resources and area shelter list resources. Patient accepted. Patient reports currently working to see if her Granddaughter-n-law can pick her up with ready for dc. All questions answered. No further questions reported at this time.

## 2022-12-07 NOTE — Progress Notes (Signed)
Note placed in error

## 2022-12-17 ENCOUNTER — Inpatient Hospital Stay: Payer: Self-pay

## 2023-01-18 ENCOUNTER — Inpatient Hospital Stay (HOSPITAL_COMMUNITY)
Admission: EM | Admit: 2023-01-18 | Discharge: 2023-01-21 | DRG: 291 | Disposition: A | Discharge status: Home / Self Care | Payer: 59 | Attending: Internal Medicine | Admitting: Internal Medicine

## 2023-01-18 ENCOUNTER — Emergency Department (HOSPITAL_COMMUNITY): Payer: 59

## 2023-01-18 DIAGNOSIS — Z7984 Long term (current) use of oral hypoglycemic drugs: Secondary | ICD-10-CM

## 2023-01-18 DIAGNOSIS — F1590 Other stimulant use, unspecified, uncomplicated: Secondary | ICD-10-CM | POA: Diagnosis present

## 2023-01-18 DIAGNOSIS — I5043 Acute on chronic combined systolic (congestive) and diastolic (congestive) heart failure: Secondary | ICD-10-CM

## 2023-01-18 DIAGNOSIS — I2510 Atherosclerotic heart disease of native coronary artery without angina pectoris: Secondary | ICD-10-CM

## 2023-01-18 DIAGNOSIS — G4733 Obstructive sleep apnea (adult) (pediatric): Secondary | ICD-10-CM | POA: Diagnosis present

## 2023-01-18 DIAGNOSIS — M1990 Unspecified osteoarthritis, unspecified site: Secondary | ICD-10-CM | POA: Diagnosis present

## 2023-01-18 DIAGNOSIS — G20A1 Parkinson's disease without dyskinesia, without mention of fluctuations: Secondary | ICD-10-CM | POA: Diagnosis present

## 2023-01-18 DIAGNOSIS — F17210 Nicotine dependence, cigarettes, uncomplicated: Secondary | ICD-10-CM | POA: Diagnosis present

## 2023-01-18 DIAGNOSIS — I4891 Unspecified atrial fibrillation: Secondary | ICD-10-CM

## 2023-01-18 DIAGNOSIS — Z7951 Long term (current) use of inhaled steroids: Secondary | ICD-10-CM

## 2023-01-18 DIAGNOSIS — E119 Type 2 diabetes mellitus without complications: Secondary | ICD-10-CM | POA: Diagnosis present

## 2023-01-18 DIAGNOSIS — B182 Chronic viral hepatitis C: Secondary | ICD-10-CM | POA: Diagnosis present

## 2023-01-18 DIAGNOSIS — M797 Fibromyalgia: Secondary | ICD-10-CM | POA: Diagnosis present

## 2023-01-18 DIAGNOSIS — Z7901 Long term (current) use of anticoagulants: Secondary | ICD-10-CM

## 2023-01-18 DIAGNOSIS — Z91148 Patient's other noncompliance with medication regimen for other reason: Secondary | ICD-10-CM

## 2023-01-18 DIAGNOSIS — J449 Chronic obstructive pulmonary disease, unspecified: Secondary | ICD-10-CM | POA: Diagnosis present

## 2023-01-18 DIAGNOSIS — G894 Chronic pain syndrome: Secondary | ICD-10-CM | POA: Diagnosis present

## 2023-01-18 DIAGNOSIS — I110 Hypertensive heart disease with heart failure: Secondary | ICD-10-CM | POA: Diagnosis not present

## 2023-01-18 DIAGNOSIS — Z79899 Other long term (current) drug therapy: Secondary | ICD-10-CM

## 2023-01-18 DIAGNOSIS — F1110 Opioid abuse, uncomplicated: Secondary | ICD-10-CM | POA: Diagnosis present

## 2023-01-18 DIAGNOSIS — I5023 Acute on chronic systolic (congestive) heart failure: Secondary | ICD-10-CM | POA: Insufficient documentation

## 2023-01-18 DIAGNOSIS — Z91199 Patient's noncompliance with other medical treatment and regimen due to unspecified reason: Secondary | ICD-10-CM

## 2023-01-18 DIAGNOSIS — I959 Hypotension, unspecified: Secondary | ICD-10-CM | POA: Diagnosis not present

## 2023-01-18 DIAGNOSIS — F329 Major depressive disorder, single episode, unspecified: Secondary | ICD-10-CM | POA: Diagnosis present

## 2023-01-18 DIAGNOSIS — Z5900 Homelessness unspecified: Secondary | ICD-10-CM

## 2023-01-18 DIAGNOSIS — Z888 Allergy status to other drugs, medicaments and biological substances status: Secondary | ICD-10-CM

## 2023-01-18 DIAGNOSIS — Z955 Presence of coronary angioplasty implant and graft: Secondary | ICD-10-CM

## 2023-01-18 DIAGNOSIS — I509 Heart failure, unspecified: Secondary | ICD-10-CM

## 2023-01-18 DIAGNOSIS — R0602 Shortness of breath: Secondary | ICD-10-CM | POA: Diagnosis not present

## 2023-01-18 DIAGNOSIS — I4821 Permanent atrial fibrillation: Secondary | ICD-10-CM | POA: Diagnosis present

## 2023-01-18 DIAGNOSIS — I051 Rheumatic mitral insufficiency: Secondary | ICD-10-CM | POA: Diagnosis present

## 2023-01-18 DIAGNOSIS — J9621 Acute and chronic respiratory failure with hypoxia: Secondary | ICD-10-CM | POA: Diagnosis present

## 2023-01-18 DIAGNOSIS — E785 Hyperlipidemia, unspecified: Secondary | ICD-10-CM | POA: Diagnosis present

## 2023-01-18 DIAGNOSIS — Z881 Allergy status to other antibiotic agents status: Secondary | ICD-10-CM

## 2023-01-18 DIAGNOSIS — F17290 Nicotine dependence, other tobacco product, uncomplicated: Secondary | ICD-10-CM | POA: Diagnosis present

## 2023-01-18 LAB — CBC WITH DIFFERENTIAL/PLATELET
Abs Immature Granulocytes: 0.05 10*3/uL (ref 0.00–0.07)
Basophils Absolute: 0 10*3/uL (ref 0.0–0.1)
Basophils Relative: 0 %
Eosinophils Absolute: 0.1 10*3/uL (ref 0.0–0.5)
Eosinophils Relative: 1 %
HCT: 35.7 % — ABNORMAL LOW (ref 36.0–46.0)
Hemoglobin: 10.9 g/dL — ABNORMAL LOW (ref 12.0–15.0)
Immature Granulocytes: 0 %
Lymphocytes Relative: 8 %
Lymphs Abs: 1.1 10*3/uL (ref 0.7–4.0)
MCH: 27 pg (ref 26.0–34.0)
MCHC: 30.5 g/dL (ref 30.0–36.0)
MCV: 88.4 fL (ref 80.0–100.0)
Monocytes Absolute: 0.5 10*3/uL (ref 0.1–1.0)
Monocytes Relative: 4 %
Neutro Abs: 12.1 10*3/uL — ABNORMAL HIGH (ref 1.7–7.7)
Neutrophils Relative %: 87 %
Platelets: 284 10*3/uL (ref 150–400)
RBC: 4.04 MIL/uL (ref 3.87–5.11)
RDW: 17.1 % — ABNORMAL HIGH (ref 11.5–15.5)
WBC: 13.9 10*3/uL — ABNORMAL HIGH (ref 4.0–10.5)
nRBC: 0 % (ref 0.0–0.2)

## 2023-01-18 LAB — COMPREHENSIVE METABOLIC PANEL
ALT: 26 U/L (ref 0–44)
AST: 39 U/L (ref 15–41)
Albumin: 2.7 g/dL — ABNORMAL LOW (ref 3.5–5.0)
Alkaline Phosphatase: 73 U/L (ref 38–126)
Anion gap: 13 (ref 5–15)
BUN: 8 mg/dL (ref 8–23)
CO2: 20 mmol/L — ABNORMAL LOW (ref 22–32)
Calcium: 8.7 mg/dL — ABNORMAL LOW (ref 8.9–10.3)
Chloride: 103 mmol/L (ref 98–111)
Creatinine, Ser: 0.85 mg/dL (ref 0.44–1.00)
GFR, Estimated: >60 mL/min (ref >60)
Glucose, Bld: 131 mg/dL — ABNORMAL HIGH (ref 70–99)
Potassium: 3.5 mmol/L (ref 3.5–5.1)
Sodium: 136 mmol/L (ref 135–145)
Total Bilirubin: 0.5 mg/dL (ref 0.3–1.2)
Total Protein: 6.2 g/dL — ABNORMAL LOW (ref 6.5–8.1)

## 2023-01-18 LAB — TROPONIN I (HIGH SENSITIVITY)
Troponin I (High Sensitivity): 26 ng/L — ABNORMAL HIGH (ref <18)
Troponin I (High Sensitivity): 31 ng/L — ABNORMAL HIGH (ref <18)

## 2023-01-18 LAB — I-STAT VENOUS BLOOD GAS, ED
Acid-base deficit: 3 mmol/L — ABNORMAL HIGH (ref 0.0–2.0)
Bicarbonate: 21.8 mmol/L (ref 20.0–28.0)
Calcium, Ion: 1.11 mmol/L — ABNORMAL LOW (ref 1.15–1.40)
HCT: 35 % — ABNORMAL LOW (ref 36.0–46.0)
Hemoglobin: 11.9 g/dL — ABNORMAL LOW (ref 12.0–15.0)
O2 Saturation: 84 %
Potassium: 3.5 mmol/L (ref 3.5–5.1)
Sodium: 140 mmol/L (ref 135–145)
TCO2: 23 mmol/L (ref 22–32)
pCO2, Ven: 37.6 mmHg — ABNORMAL LOW (ref 44–60)
pH, Ven: 7.37 (ref 7.25–7.43)
pO2, Ven: 50 mmHg — ABNORMAL HIGH (ref 32–45)

## 2023-01-18 LAB — BRAIN NATRIURETIC PEPTIDE: B Natriuretic Peptide: 293.8 pg/mL — ABNORMAL HIGH (ref 0.0–100.0)

## 2023-01-18 MED ORDER — METOPROLOL SUCCINATE ER 100 MG PO TB24: 100.0000 mg | ORAL_TABLET | Freq: Every day | ORAL | Status: DC

## 2023-01-18 MED ORDER — ALBUTEROL SULFATE (2.5 MG/3ML) 0.083% IN NEBU
3.0000 mL | INHALATION_SOLUTION | Freq: Four times a day (QID) | RESPIRATORY_TRACT | Status: DC | PRN
  Administered 2023-01-20 (×2): 3 mL via RESPIRATORY_TRACT
  Filled 2023-01-18 (×2): qty 3

## 2023-01-18 MED ORDER — ATORVASTATIN CALCIUM 80 MG PO TABS
80.0000 mg | ORAL_TABLET | Freq: Every day | ORAL | Status: DC
  Administered 2023-01-18 – 2023-01-21 (×4): 80 mg via ORAL
  Filled 2023-01-18: qty 1
  Filled 2023-01-18: qty 2
  Filled 2023-01-18 (×2): qty 1

## 2023-01-18 MED ORDER — FLUORESCEIN SODIUM 1 MG OP STRP: 1.0000 | ORAL_STRIP | Freq: Once | OPHTHALMIC | Status: DC

## 2023-01-18 MED ORDER — APIXABAN 5 MG PO TABS
5.0000 mg | ORAL_TABLET | Freq: Two times a day (BID) | ORAL | Status: DC
  Administered 2023-01-18 – 2023-01-19 (×2): 5 mg via ORAL
  Filled 2023-01-18 (×2): qty 1

## 2023-01-18 MED ORDER — NICOTINE 14 MG/24HR TD PT24
14.0000 mg | MEDICATED_PATCH | Freq: Every day | TRANSDERMAL | Status: DC
  Administered 2023-01-18 – 2023-01-21 (×4): 14 mg via TRANSDERMAL
  Filled 2023-01-18 (×4): qty 1

## 2023-01-18 MED ORDER — SENNOSIDES-DOCUSATE SODIUM 8.6-50 MG PO TABS
1.0000 | ORAL_TABLET | Freq: Every day | ORAL | Status: DC
  Administered 2023-01-18 – 2023-01-20 (×3): 1 via ORAL
  Filled 2023-01-18 (×3): qty 1

## 2023-01-18 MED ORDER — ONDANSETRON HCL 4 MG/2ML IJ SOLN: 4.0000 mg | Freq: Three times a day (TID) | INTRAMUSCULAR | Status: DC | PRN

## 2023-01-18 MED ORDER — DILTIAZEM HCL-DEXTROSE 125-5 MG/125ML-% IV SOLN (PREMIX)
5.0000 mg/h | INTRAVENOUS | Status: DC
  Administered 2023-01-18: 5 mg/h via INTRAVENOUS
  Filled 2023-01-18: qty 125

## 2023-01-18 MED ORDER — FUROSEMIDE 10 MG/ML IJ SOLN
40.0000 mg | Freq: Two times a day (BID) | INTRAMUSCULAR | Status: DC
  Administered 2023-01-18 – 2023-01-20 (×4): 40 mg via INTRAVENOUS
  Filled 2023-01-18 (×4): qty 4

## 2023-01-18 MED ORDER — BUPRENORPHINE HCL-NALOXONE HCL 8-2 MG SL SUBL
1.0000 | SUBLINGUAL_TABLET | Freq: Three times a day (TID) | SUBLINGUAL | Status: DC
  Administered 2023-01-18 – 2023-01-21 (×9): 1 via SUBLINGUAL
  Filled 2023-01-18 (×9): qty 1

## 2023-01-18 MED ORDER — ENOXAPARIN SODIUM 40 MG/0.4ML IJ SOSY: 40.0000 mg | PREFILLED_SYRINGE | INTRAMUSCULAR | Status: DC

## 2023-01-18 MED ORDER — CARBIDOPA-LEVODOPA 25-100 MG PO TABS
1.0000 | ORAL_TABLET | Freq: Three times a day (TID) | ORAL | Status: DC
  Administered 2023-01-18 – 2023-01-21 (×10): 1 via ORAL
  Filled 2023-01-18 (×12): qty 1

## 2023-01-18 MED ORDER — APIXABAN 5 MG PO TABS
5.0000 mg | ORAL_TABLET | Freq: Once | ORAL | Status: AC
  Administered 2023-01-18: 5 mg via ORAL
  Filled 2023-01-18: qty 1

## 2023-01-18 MED ORDER — FUROSEMIDE 10 MG/ML IJ SOLN
40.0000 mg | Freq: Once | INTRAMUSCULAR | Status: AC
  Administered 2023-01-18: 40 mg via INTRAVENOUS
  Filled 2023-01-18: qty 4

## 2023-01-18 MED ORDER — UMECLIDINIUM BROMIDE 62.5 MCG/ACT IN AEPB
1.0000 | INHALATION_SPRAY | Freq: Every day | RESPIRATORY_TRACT | Status: DC
  Administered 2023-01-19 – 2023-01-21 (×3): 1 via RESPIRATORY_TRACT
  Filled 2023-01-18: qty 7

## 2023-01-18 MED ORDER — POLYETHYLENE GLYCOL 3350 17 G PO PACK
17.0000 g | PACK | Freq: Every day | ORAL | Status: DC | PRN
  Administered 2023-01-19: 17 g via ORAL
  Filled 2023-01-18: qty 1

## 2023-01-18 MED ORDER — CLONIDINE HCL 0.1 MG PO TABS
0.1000 mg | ORAL_TABLET | Freq: Two times a day (BID) | ORAL | Status: DC
  Administered 2023-01-18: 0.1 mg via ORAL
  Filled 2023-01-18: qty 1

## 2023-01-18 MED ORDER — MOMETASONE FURO-FORMOTEROL FUM 100-5 MCG/ACT IN AERO
2.0000 | INHALATION_SPRAY | Freq: Two times a day (BID) | RESPIRATORY_TRACT | Status: DC
  Administered 2023-01-18 – 2023-01-21 (×6): 2 via RESPIRATORY_TRACT
  Filled 2023-01-18: qty 8.8

## 2023-01-18 MED ORDER — ARIPIPRAZOLE 10 MG PO TABS
10.0000 mg | ORAL_TABLET | Freq: Every day | ORAL | Status: DC
  Administered 2023-01-18 – 2023-01-21 (×4): 10 mg via ORAL
  Filled 2023-01-18 (×4): qty 1

## 2023-01-18 MED ORDER — METHYLPREDNISOLONE SODIUM SUCC 125 MG IJ SOLR
125.0000 mg | Freq: Once | INTRAMUSCULAR | Status: AC
  Administered 2023-01-18: 125 mg via INTRAVENOUS
  Filled 2023-01-18: qty 2

## 2023-01-18 MED ORDER — METOPROLOL SUCCINATE ER 100 MG PO TB24
100.0000 mg | ORAL_TABLET | Freq: Every day | ORAL | Status: DC
  Administered 2023-01-18: 100 mg via ORAL
  Filled 2023-01-18: qty 4

## 2023-01-18 MED ORDER — GABAPENTIN 300 MG PO CAPS
600.0000 mg | ORAL_CAPSULE | Freq: Three times a day (TID) | ORAL | Status: DC
  Administered 2023-01-18 – 2023-01-21 (×9): 600 mg via ORAL
  Filled 2023-01-18 (×9): qty 2

## 2023-01-18 MED ORDER — METOPROLOL TARTRATE 50 MG PO TABS
50.0000 mg | ORAL_TABLET | Freq: Three times a day (TID) | ORAL | Status: DC
  Administered 2023-01-19 – 2023-01-20 (×6): 50 mg via ORAL
  Filled 2023-01-18 (×5): qty 1
  Filled 2023-01-18: qty 2

## 2023-01-18 MED ORDER — DULOXETINE HCL 60 MG PO CPEP
60.0000 mg | ORAL_CAPSULE | Freq: Every day | ORAL | Status: DC
  Administered 2023-01-18 – 2023-01-21 (×4): 60 mg via ORAL
  Filled 2023-01-18: qty 2
  Filled 2023-01-18 (×3): qty 1

## 2023-01-18 MED ORDER — DILTIAZEM LOAD VIA INFUSION
15.0000 mg | Freq: Once | INTRAVENOUS | Status: AC
  Administered 2023-01-18: 15 mg via INTRAVENOUS
  Filled 2023-01-18: qty 15

## 2023-01-18 MED ORDER — IPRATROPIUM-ALBUTEROL 0.5-2.5 (3) MG/3ML IN SOLN
3.0000 mL | Freq: Once | RESPIRATORY_TRACT | Status: AC
  Administered 2023-01-18: 3 mL via RESPIRATORY_TRACT
  Filled 2023-01-18: qty 3

## 2023-01-18 MED ORDER — LOSARTAN POTASSIUM 50 MG PO TABS
25.0000 mg | ORAL_TABLET | Freq: Every day | ORAL | Status: DC
  Administered 2023-01-18: 25 mg via ORAL
  Filled 2023-01-18: qty 1

## 2023-01-18 NOTE — H&P (Signed)
NAME:  Autumn [name], MRN:  [id number], DOB:  [birth year], LOS: 0 ADMISSION DATE:  01/18/2023, Primary: Corrington, Kip A, MD  CHIEF COMPLAINT:  dyspnea   Medical Service: Internal Medicine Teaching Service         Attending Physician: Dr. Earl Lagos, MD    First Contact: Dr. Sherrilee Gilles Pager: [phone]  Second Contact: Dr. Ned Card Pager: [phone]       After Hours (After 5p/  First Contact Pager: [phone]  weekends / holidays): Second Contact Pager: [phone]   HISTORY OF PRESENT ILLNESS   Autumn [name] is 66yo person with heart failure with mildly reduced ejection fraction (40-45%), paroxysmal atrial fibrillation on Eliquis, coronary artery disease s/p stent 2020, chronic respiratory failure on 2L supplemental O2 2/2 COPD/OSA (unclear if she uses CPAP at home), polysubstance use disorder, well-controlled type II diabetes mellitus, Parkinson's disease presenting to [facility] this morning with dyspnea. History somewhat limited given patient was on CPAP machine and did not want to discuss much history. She reports she began having some dyspnea yesterday evening that persisted into this morning. She denies any chest pain, cough, fevers, chills, orthopnea, abdominal pain, nausea, vomiting, changes in bowel movements, or urinary symptoms. Does mention some leg swelling that has occurred over the last few days. Initially she states she has not been taking her medications, but further clarifies she has been taking them and has only been out of her home oxygen.  PCP: Corrington, Kip A, MD  ED COURSE   Patient initially arrived afebrile, tachypneic, tachycardic to 160's, normotensive, sating well on CPAP machine. Initial lab work with mildly elevated BNP, baseline renal function, leukocytosis at 13, flat troponin. ECG revealed atrial fibrillation with RVR and patient was started on diltiazem gtt. Cardiology consulted by ED and IMTS subsequently consulted for admission.   PAST MEDICAL HISTORY   She,  has  [name] past medical history of Anxiety, Arthritis, Attention deficit disorder (ADD), COPD (chronic obstructive pulmonary disease) (HCC), Depression, Drug abuse (HCC), Fibromyalgia, HFrEF (heart failure with reduced ejection fraction) (HCC), Hyperlipidemia, and Paroxysmal Atrial fibrillation.   HOME MEDICATIONS   Prior to Admission medications   Medication Sig Start Date End Date Taking? Authorizing Provider  acetaminophen (TYLENOL) 325 MG tablet Take 325 mg by mouth continuous as needed for mild pain or moderate pain (Patient takes as needed for pain). 09/07/22   [provider]  Albuterol Sulfate, sensor, 108 (90 Base) MCG/ACT AEPB Inhale 108 mcg into the lungs as needed (Patient uses as needed).    [provider]  apixaban (ELIQUIS) 5 MG TABS tablet Take 1 tablet (5 mg total) by mouth in the morning and at bedtime. 01/15/21 12/03/22  [place], Alvira Philips, DO  ARIPiprazole (ABILIFY) 10 MG tablet Take 1 tablet (10 mg total) by mouth daily. 01/15/21 01/16/23  [place], Alvira Philips, DO  atorvastatin (LIPITOR) 80 MG tablet Take 1 tablet (80 mg total) by mouth daily. 12/07/22   Carrion-Carrero, Karle Starch, MD  Budeson-Glycopyrrol-Formoterol (BREZTRI AEROSPHERE) 160-9-4.8 MCG/ACT AERO Inhale 2 puffs into the lungs 2 (two) times daily. 02/23/22   [provider]  buprenorphine-naloxone (SUBOXONE) 8-2 mg SUBL SL tablet Place 1 tablet daily under the tongue. 06/28/17   Autumn Aspen, Limmie Patricia, MD  busPIRone (BUSPAR) 10 MG tablet Take 10 mg by mouth 2 (two) times daily. 12/01/21   [provider]  carbidopa-levodopa (SINEMET IR) 25-100 MG tablet Take 1 tablet by mouth 3 (three) times daily. 08/21/22   [provider]  cloNIDine (CATAPRES) 0.1 MG  tablet Take 0.1 mg by mouth 2 (two) times daily.    [provider]  cyclobenzaprine (FLEXERIL) 10 MG tablet Take 10 mg by mouth at bedtime as needed for muscle spasms. 12/27/20   [provider]  dapagliflozin propanediol (FARXIGA)  5 MG TABS tablet Take 2 tablets (10 mg total) by mouth daily. 12/06/22   Carrion-Carrero, Karle Starch, MD  diclofenac Sodium (VOLTAREN) 1 % GEL Apply 2-4 g topically 4 (four) times daily as needed (pain). 12/18/20   [provider]  DULoxetine (CYMBALTA) 60 MG capsule Take 1 capsule (60 mg total) by mouth daily. 01/15/21 12/03/23  [place], Autumn J, DO  fluticasone (FLONASE) 50 MCG/ACT nasal spray Place 2 sprays into both nostrils daily. 02/23/22   [provider]  furosemide (LASIX) 40 MG tablet Take 40 mg by mouth daily. 09/08/22   [provider]  gabapentin (NEURONTIN) 600 MG tablet Take 600 mg by mouth 3 (three) times daily. 09/01/22   [provider]  guaiFENesin-dextromethorphan (ROBITUSSIN DM) 100-10 MG/5ML syrup Take 5 mLs by mouth every 6 (six) hours as needed for cough. 12/06/22   Carrion-Carrero, Karle Starch, MD  losartan (COZAAR) 25 MG tablet Take 1 tablet (25 mg total) by mouth daily. 12/07/22   Carrion-Carrero, Karle Starch, MD  melatonin 3 MG TABS tablet Take 1 tablet (3 mg total) by mouth at bedtime as needed. 12/06/22   Carrion-Carrero, Karle Starch, MD  metoprolol succinate (TOPROL-XL) 100 MG 24 hr tablet Take 1 tablet (100 mg total) by mouth at bedtime. Take with or immediately following [name] meal. 12/06/22   Carrion-Carrero, Karle Starch, MD  Multiple Vitamin (MULTIVITAMIN WITH MINERALS) TABS Take 1 tablet by mouth every morning.     [provider]  naloxone [facility]) nasal spray 4 mg/0.1 mL Place 4 mg into the nose as needed (overdose, as needed). 10/06/19   [provider]  oxybutynin (DITROPAN-XL) 10 MG 24 hr tablet Take 10 mg by mouth daily. 09/01/22   [provider]  pantoprazole (PROTONIX) 40 MG tablet Take 1 tablet (40 mg total) by mouth daily. 01/15/21 12/03/22  [place], Autumn J, DO  prochlorperazine (COMPAZINE) 5 MG tablet Take 1 tablet (5 mg total) by mouth every 6 (six) hours as needed for nausea or vomiting. 12/07/22   Autumn Georges, MD  umeclidinium  bromide (INCRUSE ELLIPTA) 62.5 MCG/ACT AEPB Inhale 1 puff into the lungs daily. Patient not taking: Reported on 12/03/2022    [provider]    ALLERGIES   Allergies as of 01/18/2023 - Review Complete 01/18/2023  Allergen Reaction Noted   Nortriptyline hcl Anaphylaxis 11/07/2018   Doxycycline Other (See Comments)     SOCIAL HISTORY   Patient reports she lives in an apartment with her grandchild. She is able to complete ADL's and takes care of her medications by herself. She smokes cigarettes and reports also smoking marijuana and using methamphetamines on the weekends (most recently as this last weekend).   FAMILY HISTORY   Her family history is not on file.   REVIEW OF SYSTEMS   ROS per history of present illness.  PHYSICAL EXAMINATION   Blood pressure 116/87, pulse (!) 134, temperature 98.1 F (36.7 C), temperature source Oral, resp. rate (!) 23, height 5\' 4"  (1.626 m), weight 99.3 kg, SpO2 94 %.    Filed Weights   01/18/23 0845  Weight: 99.3 kg   GENERAL: Chronically ill-appearing person resting in bed in no acute distress HENT: Normocephalic, atraumatic.  EYES: Vision grossly in tact. No scleral icterus or conjunctival  injection.  CV: Tachycardic, irregular. No murmurs appreciated. Warm extremities.  PULM: Normal work of breathing on CPAP machine. Clear to auscultation bilaterally.  GI: Abdomen soft, non-tender, non-distended. Normoactive bowel sounds.  MSK: Normal bulk, tone. Trace pitting edema bilaterally.  SKIN: Warm, dry. No rashes or lesions noted.  NEURO: Awake, alert, conversing appropriately. Grossly non-focal. Moving all four extremities spontaneously.  PSYCH: Normal mood, affect.   SIGNIFICANT DIAGNOSTIC TESTS   ECG: Atrial fibrillation with rapid ventricular rate. No significant ST changes.   I personally reviewed patient's ECG with my interpretation as above.  CXR: Increased vascular congestion, especially bibasilar. Mild pleural effusions  bilaterally. No focal consolidations.   I personally reviewed patient's CXR with my interpretation as above.  LABS      Latest Ref Rng & Units 01/18/2023    9:45 AM 01/18/2023    9:35 AM 12/06/2022    1:42 AM  CBC  WBC 4.0 - 10.5 K/uL  13.9  8.9   Hemoglobin 12.0 - 15.0 g/dL 16.1  09.6  04.5   Hematocrit 36.0 - 46.0 % 35.0  35.7  37.4   Platelets 150 - 400 K/uL  284  249       Latest Ref Rng & Units 01/18/2023    9:45 AM 01/18/2023    9:35 AM 12/06/2022    1:42 AM  BMP  Glucose 70 - 99 mg/dL  409  97   BUN 8 - 23 mg/dL  8  15   Creatinine 8.11 - 1.00 mg/dL  9.14  7.82   Sodium 956 - 145 mmol/L 140  136  139   Potassium 3.5 - 5.1 mmol/L 3.5  3.5  4.5   Chloride 98 - 111 mmol/L  103  101   CO2 22 - 32 mmol/L  20  27   Calcium 8.9 - 10.3 mg/dL  8.7  8.8     CONSULTS   cardiology  ASSESSMENT   Betsi Fugua is 66yo person with HFmrEF (40-45%), paroxysmal atrial fibrillation on Eliquis, coronary artery disease s/p stent 2020, chronic respiratory failure on 2L supplemental O2 2/2 COPD/OSA, polysubstance use disorder, well-controlled type II diabetes mellitus, Parkinson's disease admitted 6/10 for atrial fibrillation with rapid ventricular response and acute on chronic heart failure.   PLAN   Principal Problem:   Atrial fibrillation with rapid ventricular response (HCC)  #Atrial fibrillation with RVR #Acute on chronic heart failure #Acute on chronic respiratory failure Initially when EMS arrived to patient's house SpO2 70's with significant wheezing and in atrial fibrillation with RVR. Unable to get [name] good history from the patient, reports dyspnea started yesterday. Suspect much of this is related to medication non-compliance and ongoing substance use with methamphetamines. Cardizem gtt was started in the ED and cardiology was consulted. During last admission Echo revealed EF 40-45%, although this was while she was in atrial fibrillation. Would ideally like [name] repeat Echo this  admission once heart rate is better controlled. Also has mild heart failure, likely from the atrial fibrillation. She did receive [name] dose of lasix in the ED. She denies any recent cough or sputum production, suspect most of her respiratory symptoms are cardiac related instead of primarily COPD/OSA. Will hold off further steroids, continue with her home inhalers for now.  - Wean diltiazem gtt as able, appreciate cardiology's recommendations - Continue home metoprolol succinate 100mg  daily - Received diuresis earlier today, needs daily volume assessments - Repeat Echocardiogram once rate better controlled - Continue home apixaban 5mg  daily - Continue  home GDMT - Continue home inhalers, needs CPAP at night - Strict I/O, daily weights - Daily BMP, Mg while diuresing  #Polysubstance use disorder Patient continues to use methamphetamines, marijuana as noted in previous admissions. She also takes suboxone three times daily for opioid use disorder, states compliance with this.  - Continue home suboxone 8-2mg  three times daily - Nicotine patch - TOC consult for substance use  #Type II diabetes mellitus Last A1c in March 6.1%. Only medication is metformin. Sugar well controlled on admission.  - SSI  #Parkinson's disease Stable, follows outpatient neurology for this.  - Continue home carbidopa-levodopa   #CAD s/p stenting No chest pain, troponin flat, ECG without changes. - Continue home statin  #Major depressive disorder - Continue home Abilify  #Chronic pain syndrome - Continue home duloxetine, gabapentin  #Chronic hepatitis C infection Unclear if this has been treated or not? Noted in the chart dating back to at least 2008. No clinical signs of cirrhosis or synthetic dysfunction. - Follow-up outpatient   BEST PRACTICE   DIET: HH IVF: na DVT PPX: Eliquis BOWEL: Senokot-S, miralax CODE: FULL FAM COM: Requests I call family member Estelle June, will discuss with her this PM  DISPO:  Admit patient to Observation with expected length of stay less than 2 midnights.  Evlyn Kanner, MD Internal Medicine Resident PGY-3 Pager [phone] 01/18/23 12:33 PM

## 2023-01-18 NOTE — ED Notes (Addendum)
Patient was asleep wearing CPAP machine, RN woke patient up to give oral medication. Once CPAP was removed, patient began to yell that she doesn't want to wear the CPAP anymore and that she will not be wearing it at home. Cardiologist at bedside at this time.

## 2023-01-18 NOTE — Consult Note (Addendum)
Cardiology Consultation   Patient ID: Autumn [name] MRN: [id number]; DOB: 11/21/1955  Admit date: 01/18/2023 Date of Consult: 01/18/2023  PCP:  Vivien Presto, MD   [facility] Providers Cardiologist:  Thomasene Ripple, DO   Patient Profile:   Autumn [name] is a 68 y.o. female with a hx of CAD s/p atherectomy and cutting balloon with stenting to mLAD 2020, chronic systolic heart failure, PAF, moderate MR, PAD, COPD, Parkinson's disorder, substance use, chronic respiratory failure, and hypertension who is being seen 01/18/2023 for the evaluation of A-fib RVR at the request of Dr. Heide Spark.  History of Present Illness:   Autumn [name] with the above past medical history has been followed by [facility], [facility], and [facility].    She was briefly followed by our Afib clinic in 2022 for Afib RVR. Pt presented to ER 01/2021 with Afib RVR. DCCV was attempted in the ER but unsuccessful leading to admission and rate control. She converted on IV cardizem. Echo with LVEF 60-65% without RWMA and normal diastolic function.   She was admitted in January 2024 for shortness of breath found to be in A-fib RVR and started on Cardizem drip with improvement in rates.  Echo 09/01/22 showed LVEF 40-45% severe LAE, mild AI, moderate MR. At follow-up she remained in A-fib and underwent cardioversion but per the patient had an early return to A-fib. She was discharged on eliquis 5 mg BID and 200 mg lopressor daily.  Subsequent outpatient family med visits that showed methamphetamine and alcohol use while being on Suboxone.  Recent drug use suspected to have contributed to her Afib RVR.    She presented 12/03/22 to [facility] with dyspnea and palpitations noted to be in Afib RVR. During that hospitalization, she states she was forcefully brought to ER by her daughter via EMS. She was noncompliant on medications at that time. She continued to use methamphetamine at that time. Echo with LVEF 40-45%.  During that  hospitalization, A-fib felt to be permanent at this time and beta-blocker dose was increased.  Unfortunately blood pressure was marginal therefore GDMT for cardiomyopathy was difficult.  She presented to [facility] via EMS with SOB. O2 70% on RA improved on CPAP and duoneb tx. HR reportedly 200 with EMS. She initially did not want to be at [facility]. She later reported leg swelling. She initially reported being out of medications, but later reported she had been taking medications and was only out of O2. She was placed on cardizem gtt.   PTA medications listed include 80 mg lipitor, eliquis, farxiga, lasix, 25 mg losartan, 100 mg toprol, clonidine 0.1 mg BID  HR 130s, BP 116/87, 96% on CPAP BNP 294 WBC 13.9 Hb 10.9 sCr 0.85 CO2 20 Albumin 2.7 HS troponin 26 --> 31  Cardiology was consulted for Afib RVR and CHF exacerbation. During my interview, she was combative with RN regarding CPAP. She is now off of CPAP and oxygenating well on 2L [place]. She states she lost her apartment and was homeless, leaving behind her O2. She has been without O2 for 2 months and has since moved in with her grandson and is helping care for her great grandchild who is 45 months old. She does some grocery shopping but is otherwise sedentary. She does not provide helpful history aside from reporting taking all cardiac medications at one time, including eliquis and clonidine. She was able to tell me the dose of toprol 100 mg. She woke up this morning and couldn't breathe  prompting EMS evaluation. She reports lower extremity swelling since sleeping on the couch with her legs down 2 days ago. She denies orthopnea. She continues to smoke cigarettes.   She states her breathing is back to baseline. She denies chest pain. She states her heart rate is always in the 150s or above.    Past Medical History:  Diagnosis Date   Anxiety    Arthritis    Attention deficit disorder (ADD)    COPD (chronic obstructive pulmonary disease) (HCC)     Depression    Drug abuse (HCC)    Fibromyalgia    HFrEF (heart failure with reduced ejection fraction) (HCC)    Hyperlipidemia    Paroxysmal Atrial fibrillation     Past Surgical History:  Procedure Laterality Date   ANKLE SURGERY     FACIAL COSMETIC SURGERY       Home Medications:  Prior to Admission medications   Medication Sig Start Date End Date Taking? Authorizing Provider  acetaminophen (TYLENOL) 325 MG tablet Take 325 mg by mouth continuous as needed for mild pain or moderate pain (Patient takes as needed for pain). 09/07/22   [provider]  Albuterol Sulfate, sensor, 108 (90 Base) MCG/ACT AEPB Inhale 108 mcg into the lungs as needed (Patient uses as needed).    [provider]  apixaban (ELIQUIS) 5 MG TABS tablet Take 1 tablet (5 mg total) by mouth in the morning and at bedtime. 01/15/21 12/03/22  [place], Alvira Philips, DO  ARIPiprazole (ABILIFY) 10 MG tablet Take 1 tablet (10 mg total) by mouth daily. 01/15/21 01/16/23  [place], Alvira Philips, DO  atorvastatin (LIPITOR) 80 MG tablet Take 1 tablet (80 mg total) by mouth daily. 12/07/22   Carrion-Carrero, Karle Starch, MD  Budeson-Glycopyrrol-Formoterol (BREZTRI AEROSPHERE) 160-9-4.8 MCG/ACT AERO Inhale 2 puffs into the lungs 2 (two) times daily. 02/23/22   [provider]  buprenorphine-naloxone (SUBOXONE) 8-2 mg SUBL SL tablet Place 1 tablet daily under the tongue. 06/28/17   Philip Aspen, Limmie Patricia, MD  busPIRone (BUSPAR) 10 MG tablet Take 10 mg by mouth 2 (two) times daily. 12/01/21   [provider]  carbidopa-levodopa (SINEMET IR) 25-100 MG tablet Take 1 tablet by mouth 3 (three) times daily. 08/21/22   [provider]  cloNIDine (CATAPRES) 0.1 MG tablet Take 0.1 mg by mouth 2 (two) times daily.    [provider]  cyclobenzaprine (FLEXERIL) 10 MG tablet Take 10 mg by mouth at bedtime as needed for muscle spasms. 12/27/20   [provider]  dapagliflozin propanediol (FARXIGA) 5 MG TABS  tablet Take 2 tablets (10 mg total) by mouth daily. 12/06/22   Carrion-Carrero, Karle Starch, MD  diclofenac Sodium (VOLTAREN) 1 % GEL Apply 2-4 g topically 4 (four) times daily as needed (pain). 12/18/20   [provider]  DULoxetine (CYMBALTA) 60 MG capsule Take 1 capsule (60 mg total) by mouth daily. 01/15/21 12/03/23  [place], Eric J, DO  fluticasone (FLONASE) 50 MCG/ACT nasal spray Place 2 sprays into both nostrils daily. 02/23/22   [provider]  furosemide (LASIX) 40 MG tablet Take 40 mg by mouth daily. 09/08/22   [provider]  gabapentin (NEURONTIN) 600 MG tablet Take 600 mg by mouth 3 (three) times daily. 09/01/22   [provider]  guaiFENesin-dextromethorphan (ROBITUSSIN DM) 100-10 MG/5ML syrup Take 5 mLs by mouth every 6 (six) hours as needed for cough. 12/06/22   Carrion-Carrero, Karle Starch, MD  losartan (COZAAR) 25 MG tablet Take 1 tablet (25 mg total) by  mouth daily. 12/07/22   Carrion-Carrero, Karle Starch, MD  melatonin 3 MG TABS tablet Take 1 tablet (3 mg total) by mouth at bedtime as needed. 12/06/22   Carrion-Carrero, Karle Starch, MD  metoprolol succinate (TOPROL-XL) 100 MG 24 hr tablet Take 1 tablet (100 mg total) by mouth at bedtime. Take with or immediately following a meal. 12/06/22   Carrion-Carrero, Karle Starch, MD  Multiple Vitamin (MULTIVITAMIN WITH MINERALS) TABS Take 1 tablet by mouth every morning.     [provider]  naloxone [facility]) nasal spray 4 mg/0.1 mL Place 4 mg into the nose as needed (overdose, as needed). 10/06/19   [provider]  oxybutynin (DITROPAN-XL) 10 MG 24 hr tablet Take 10 mg by mouth daily. 09/01/22   [provider]  pantoprazole (PROTONIX) 40 MG tablet Take 1 tablet (40 mg total) by mouth daily. 01/15/21 12/03/22  [place], Eric J, DO  prochlorperazine (COMPAZINE) 5 MG tablet Take 1 tablet (5 mg total) by mouth every 6 (six) hours as needed for nausea or vomiting. 12/07/22   Evette Georges, MD  umeclidinium bromide  (INCRUSE ELLIPTA) 62.5 MCG/ACT AEPB Inhale 1 puff into the lungs daily. Patient not taking: Reported on 12/03/2022    [provider]    Inpatient Medications: Scheduled Meds:  apixaban  5 mg Oral BID   ARIPiprazole  10 mg Oral Daily   atorvastatin  80 mg Oral Daily   buprenorphine-naloxone  1 tablet Sublingual TID   carbidopa-levodopa  1 tablet Oral TID   cloNIDine  0.1 mg Oral BID   DULoxetine  60 mg Oral Daily   gabapentin  600 mg Oral TID   losartan  25 mg Oral Daily   metoprolol succinate  100 mg Oral Daily   mometasone-formoterol  2 puff Inhalation BID   umeclidinium bromide  1 puff Inhalation Daily   Continuous Infusions:  diltiazem (CARDIZEM) infusion 15 mg/hr (01/18/23 1104)   PRN Meds: albuterol  Allergies:    Allergies  Allergen Reactions   Nortriptyline Hcl Anaphylaxis   Doxycycline Other (See Comments)    Social History:   Social History   Socioeconomic History   Marital status: Divorced    Spouse name: Not on file   Number of children: Not on file   Years of education: Not on file   Highest education level: Not on file  Occupational History   Occupation: disabled  Tobacco Use   Smoking status: Every Day    Packs/day: .5    Types: Cigarettes   Smokeless tobacco: Never   Tobacco comments:    Half pack daily going to try patches  Vaping Use   Vaping Use: Every day  Substance and Sexual Activity   Alcohol use: Not Currently    Comment: A fifth of vodka a day - none in a year   Drug use: Yes    Types: Cocaine, Marijuana    Comment: uses marijuana regularly; used cocaine " a long time ago"   Sexual activity: Not on file  Other Topics Concern   Not on file  Social History Narrative   Not on file   Social Determinants of Health   Financial Resource Strain: Not on file  Food Insecurity: Not on file  Transportation Needs: Not on file  Physical Activity: Not on file  Stress: Not on file  Social Connections: Not on file  Intimate  Partner Violence: Not At Risk (12/04/2022)   Humiliation, Afraid, Rape, and Kick questionnaire    Fear of Current or Ex-Partner: No  Emotionally Abused: No    Physically Abused: No    Sexually Abused: No    Family History:   No family history on file.   ROS:  Please see the history of present illness.   All other ROS reviewed and negative.     Physical Exam/Data:   Vitals:   01/18/23 1030 01/18/23 1103 01/18/23 1147 01/18/23 1215  BP: (!) 103/92 116/87    Pulse: 78 (!) 142 (!) 147 (!) 134  Resp: (!) 22  (!) 28 (!) 23  Temp:      TempSrc:      SpO2: 96%  96% 94%  Weight:      Height:       No intake or output data in the 24 hours ending 01/18/23 1240    01/18/2023    8:45 AM 12/07/2022    6:16 AM 12/06/2022    9:43 AM  Last 3 Weights  Weight (lbs) 219 lb 214 lb 3.2 oz 211 lb 8 oz  Weight (kg) 99.338 kg 97.16 kg 95.936 kg     Body mass index is 37.59 kg/m.  General:  Well nourished, well developed, in no acute distress HEENT: normal Neck: no JVD Vascular: No carotid bruits; Distal pulses 2+ bilaterally Cardiac:  normal S1, S2; RRR; no murmur  Lungs:  rhonchi throughout with scattered wheezing Abd: soft, nontender, no hepatomegaly  Ext: 1+ B LE edema Musculoskeletal:  No deformities, BUE and BLE strength normal and equal Skin: warm and dry  Neuro:  CNs 2-12 intact, no focal abnormalities noted Psych:  Normal affect   EKG:  The EKG was personally reviewed and demonstrates:  afib with VR 173, anteroseptal infarct (old) Telemetry:  Telemetry was personally reviewed and demonstrates:  afib in the 90-120s  Relevant CV Studies:  Echocardiogram 12/03/22 1. Left ventricular ejection fraction, by estimation, is 40 to 45% in the  setting of rapid atrial fbrillation with heart rate 125-130bpm.. The left  ventricle has mildly decreased function. The left ventricle demonstrates  global hypokinesis. The left  ventricular internal cavity size was mildly dilated. Left  ventricular  diastolic function could not be evaluated.   2. Right ventricular systolic function is normal. The right ventricular  size is normal.   3. Right atrial size was mildly dilated.   4. The mitral valve is degenerative. Mild mitral valve regurgitation. No  evidence of mitral stenosis. Moderate mitral annular calcification.   5. The aortic valve is tricuspid. Aortic valve regurgitation is trivial.  Aortic valve sclerosis/calcification is present, without any evidence of  aortic stenosis.   6. The inferior vena cava is normal in size with greater than 50%  respiratory variability, suggesting right atrial pressure of 3 mmHg.   7. Recommend repeat limited echo once heart rate controlled to more  accurately assess LVF.   Laboratory Data:  High Sensitivity Troponin:   Recent Labs  Lab 01/18/23 0935 01/18/23 1108  TROPONINIHS 26* 31*     Chemistry Recent Labs  Lab 01/18/23 0935 01/18/23 0945  NA 136 140  K 3.5 3.5  CL 103  --   CO2 20*  --   GLUCOSE 131*  --   BUN 8  --   CREATININE 0.85  --   CALCIUM 8.7*  --   GFRNONAA >60  --   ANIONGAP 13  --     Recent Labs  Lab 01/18/23 0935  PROT 6.2*  ALBUMIN 2.7*  AST 39  ALT 26  ALKPHOS 73  BILITOT  0.5   Lipids No results for input(s): "CHOL", "TRIG", "HDL", "LABVLDL", "LDLCALC", "CHOLHDL" in the last 168 hours.  Hematology Recent Labs  Lab 01/18/23 0935 01/18/23 0945  WBC 13.9*  --   RBC 4.04  --   HGB 10.9* 11.9*  HCT 35.7* 35.0*  MCV 88.4  --   MCH 27.0  --   MCHC 30.5  --   RDW 17.1*  --   PLT 284  --    Thyroid No results for input(s): "TSH", "FREET4" in the last 168 hours.  BNP Recent Labs  Lab 01/18/23 0902  BNP 293.8*    DDimer No results for input(s): "DDIMER" in the last 168 hours.   Radiology/Studies:  DG Chest Portable 1 View  Result Date: 01/18/2023 CLINICAL DATA:  Shortness of breath. EXAM: PORTABLE CHEST 1 VIEW COMPARISON:  Chest x-ray dated December 04, 2022. FINDINGS: The patient  is rotated to the right. Unchanged mild cardiomegaly. Increased interstitial thickening at both lung bases with layering small bilateral pleural effusions and mild bibasilar atelectasis. No pneumothorax. No acute osseous abnormality. IMPRESSION: 1. Mild congestive heart failure. Electronically Signed   By: Obie Dredge M.D.   On: 01/18/2023 10:25     Assessment and Plan:   Atrial fibrillation with RVR Hx of PAF, now considered permanent - has been on cardizem +/- 100 mg lopressor and 0.125 mg digoxin previously, also unsuccessful DCCV, now felt permanent  - cardizem likely discontinued due to reduced EF in Jan 2024 - takes 100 mg toprol at home without good rate control, per the patient - currently running 15 mg/hr cardizem with better rate control in the 90-120s - long-term, would like to transition back to toprol for rate control given HFrEF - transition cardizem IV to BB - has received 100 mg toprol today - will add 50 mg lopressor TID to start tonight and wean off cardizem - not a good candidate for antiarrhythmics given noncompliance overall and recent noncompliance with eliquis - if better rate control needed, titrate BB and could consider digoxin 0.125 mg   Chronic anticoagulation On eliquis 5 mg BID without bleeding issues, but reports taking this only once daily or 10 mg once daily, unclear from patient interview.  - would transition to xarelto 20 mg daily at discharge   CAD with prior PCI/DES - LAD No ASA given eliquis - no chest pain - continue BB and statin   Acute on Chronic systolic heart failure Echo Jan and April 2024 with LVEF 40-45% - GDMT PTA: toprol, losartan, farxiga - repeat echo now that she is better rate controlled - will likely need titration of GDMT in addition to toprol, home losartan, SGLT2i - if EF considerably down from April, would opt for once daily imdur rather than BID or TID medications - may need to avoid ACE/ARB/ARNI/MRA if she will not be  compliant with follow up labs - although was on 25 mg losartan at home with stable BMP - has received 40 mg lasix IV - increase this to 40 mg IV lasix BID for now   Hypertension - 0.1 mg clonidine BID listed on her home medications, but likely not a good choice for  her given noncompliance and reports of taking all medications once daily - consider increasing losartan if more pressure control needed   Acute on Chronic respiratory failure Likely multifactorial given being out of O2 for 2 months, CHF, and Afib RVR - feeling better after CPAP treatment this morning   COPD On 2  L [place]   Parkinson's disease Polysubstance abuse OSA Current smoker - does not want to quit Per primary    Risk Assessment/Risk Scores:       For questions or updates, please contact [facility] Please consult [email or web address] for contact info under    Signed, Marcelino Duster, PA  01/18/2023 12:40 PM   Patient seen and examined.  Agree with above documentation.  Autumn [name] is a 67 year old female with a history of CAD status post PCI to mid LAD in 2020, likely permanent atrial fibrillation, chronic systolic heart failure, moderate mitral regurgitation, COPD, PAD, Parkinson's disease, polysubstance abuse, hypertension who we are consulted by Dr. Heide Spark for evaluation of atrial fibrillation.  She was admitted in January 2024 with A-fib with RVR.  Echo at that time showed EF 40 to 45%.  At follow-up she was still in A-fib and cardioversion was attempted but had early return of A-fib.  She was discharged on Eliquis 5 mg twice daily, metoprolol 200 mg daily.  Reports has only been taking her medications once daily and continues to use methamphetamine.  She was admitted April 2024 with A-fib with RVR.  It was felt that her A-fib was likely permanent and beta-blocker dose was titrated.  Echocardiogram at that time showed EF 40 to 45%.  She presented today with shortness of breath, found to have SpO2 down  to 70s.  Initial vital signs on presentation to ED notable for BP 118/79, heart rate up to 170s.  EKG shows atrial fibrillation, rate 173, less than 1 mm ST depressions.  Labs notable for creatinine 0.85, albumin 2.7, BNP 294, troponin 26 > 31, WBC 13.9, hemoglobin 10.9, platelets 284, chest x-ray shows mild CHF.  She was started on diltiazem drip, currently at 15 mg/h with improvement in heart rate in A-fib to 100s.  On exam, patient is alert and oriented, irregular, tachycardic, no murmurs, diminished breath sounds, 1+ lower extremity edema, + JVD.  For her A-fib with RVR, suspect she likely has permanent A-fib and not a good candidate for cardioversion given her noncompliance and ongoing substance abuse.  Recommend rate control strategy.  Rates currently appear controlled on diltiazem drip but would favor switching to metoprolol given her systolic dysfunction.  She received Toprol-XL 100 mg earlier today, would wean off diltiazem drip.  Will switch to Lopressor 50 mg 3 times daily and titrate as needed for rate control.  She does appear volume overloaded on exam, would diurese with IV Lasix 40 mg twice daily.  Will update echocardiogram.  Little Ishikawa, MD

## 2023-01-18 NOTE — Progress Notes (Signed)
Pt placed on CPAP w/ a pressure of 5 while napping. Tolerating well at this time.

## 2023-01-18 NOTE — ED Triage Notes (Signed)
Pt BIB EMS from home with SOB since last night. Pt has hx of afib and COPD. Upon EMS arrival pt SaO2 70%, cPAP and duoneb improved symptoms to SaO2 100% and improved breathing. Pt yelling "I did not want to come to the hospital" and is upset. Pt presents with labored breathing while on neb treatment.   EMS VS 200 HR 101/82 100 %

## 2023-01-18 NOTE — ED Provider Notes (Signed)
[place] EMERGENCY DEPARTMENT AT [facility] Provider Note   CSN: [id number] Arrival date & time: 01/18/23  0865     History  Chief Complaint  Patient presents with   Shortness of Breath    Autumn [name] is a 67 y.o. female. With pmh CAD s/p DES, HTN, permanent a-fib on Eliquis, HLD, parkinson's, systolic HF, COPD, and polysubstance use presents with increased work of breathing and cough over the past week.  Her family member finally called EMS today for although she was hesitant to come in.  She misses her meds often and is still smoking.  She also smoked meth this past weekend.  She denies any active chest pain and denies any hemoptysis.  She endorses increased bilateral leg swelling over the past few days but that this because she believes she was hanging her legs off the bed.  Her cough has been nonproductive.  She has had no fevers or chills.  HPI     Home Medications Prior to Admission medications   Medication Sig Start Date End Date Taking? Authorizing Provider  acetaminophen (TYLENOL) 325 MG tablet Take 325 mg by mouth continuous as needed for mild pain or moderate pain (Patient takes as needed for pain). 09/07/22   [provider]  Albuterol Sulfate, sensor, 108 (90 Base) MCG/ACT AEPB Inhale 108 mcg into the lungs as needed (Patient uses as needed).    [provider]  apixaban (ELIQUIS) 5 MG TABS tablet Take 1 tablet (5 mg total) by mouth in the morning and at bedtime. 01/15/21 12/03/22  [place], Alvira Philips, DO  ARIPiprazole (ABILIFY) 10 MG tablet Take 1 tablet (10 mg total) by mouth daily. 01/15/21 01/16/23  [place], Alvira Philips, DO  atorvastatin (LIPITOR) 80 MG tablet Take 1 tablet (80 mg total) by mouth daily. 12/07/22   Carrion-Carrero, Karle Starch, MD  Budeson-Glycopyrrol-Formoterol (BREZTRI AEROSPHERE) 160-9-4.8 MCG/ACT AERO Inhale 2 puffs into the lungs 2 (two) times daily. 02/23/22   [provider]  buprenorphine-naloxone (SUBOXONE) 8-2 mg SUBL SL  tablet Place 1 tablet daily under the tongue. 06/28/17   Philip Aspen, Limmie Patricia, MD  busPIRone (BUSPAR) 10 MG tablet Take 10 mg by mouth 2 (two) times daily. 12/01/21   [provider]  carbidopa-levodopa (SINEMET IR) 25-100 MG tablet Take 1 tablet by mouth 3 (three) times daily. 08/21/22   [provider]  cloNIDine (CATAPRES) 0.1 MG tablet Take 0.1 mg by mouth 2 (two) times daily.    [provider]  cyclobenzaprine (FLEXERIL) 10 MG tablet Take 10 mg by mouth at bedtime as needed for muscle spasms. 12/27/20   [provider]  dapagliflozin propanediol (FARXIGA) 5 MG TABS tablet Take 2 tablets (10 mg total) by mouth daily. 12/06/22   Carrion-Carrero, Karle Starch, MD  diclofenac Sodium (VOLTAREN) 1 % GEL Apply 2-4 g topically 4 (four) times daily as needed (pain). 12/18/20   [provider]  DULoxetine (CYMBALTA) 60 MG capsule Take 1 capsule (60 mg total) by mouth daily. 01/15/21 12/03/23  [place], Eric J, DO  fluticasone (FLONASE) 50 MCG/ACT nasal spray Place 2 sprays into both nostrils daily. 02/23/22   [provider]  furosemide (LASIX) 40 MG tablet Take 40 mg by mouth daily. 09/08/22   [provider]  gabapentin (NEURONTIN) 600 MG tablet Take 600 mg by mouth 3 (three) times daily. 09/01/22   [provider]  guaiFENesin-dextromethorphan (ROBITUSSIN DM) 100-10 MG/5ML syrup Take 5 mLs by mouth every 6 (six) hours as needed for cough.  12/06/22   Carrion-Carrero, Karle Starch, MD  losartan (COZAAR) 25 MG tablet Take 1 tablet (25 mg total) by mouth daily. 12/07/22   Carrion-Carrero, Karle Starch, MD  melatonin 3 MG TABS tablet Take 1 tablet (3 mg total) by mouth at bedtime as needed. 12/06/22   Carrion-Carrero, Karle Starch, MD  metoprolol succinate (TOPROL-XL) 100 MG 24 hr tablet Take 1 tablet (100 mg total) by mouth at bedtime. Take with or immediately following a meal. 12/06/22   Carrion-Carrero, Karle Starch, MD  Multiple Vitamin (MULTIVITAMIN WITH MINERALS)  TABS Take 1 tablet by mouth every morning.     [provider]  naloxone [facility]) nasal spray 4 mg/0.1 mL Place 4 mg into the nose as needed (overdose, as needed). 10/06/19   [provider]  oxybutynin (DITROPAN-XL) 10 MG 24 hr tablet Take 10 mg by mouth daily. 09/01/22   [provider]  pantoprazole (PROTONIX) 40 MG tablet Take 1 tablet (40 mg total) by mouth daily. 01/15/21 12/03/22  [place], Eric J, DO  prochlorperazine (COMPAZINE) 5 MG tablet Take 1 tablet (5 mg total) by mouth every 6 (six) hours as needed for nausea or vomiting. 12/07/22   Evette Georges, MD  umeclidinium bromide (INCRUSE ELLIPTA) 62.5 MCG/ACT AEPB Inhale 1 puff into the lungs daily. Patient not taking: Reported on 12/03/2022    [provider]      Allergies    Nortriptyline hcl and Doxycycline    Review of Systems   Review of Systems  Physical Exam Updated Vital Signs BP 104/66 (BP Location: Right Arm)   Pulse 84   Temp 97.7 F (36.5 C) (Oral)   Resp 15   Ht 5\' 4"  (1.626 m)   Wt 99.3 kg   SpO2 90%   BMI 37.59 kg/m  Physical Exam Constitutional: Alert and oriented. Chronically ill appearing, mild respiratory distress, diaphoretic on head. Eyes: Conjunctivae are normal. ENT      Head: Normocephalic and atraumatic. Cardiovascular: S1, S2,  tachycardic, irregular Respiratory: tachypneic, wheezes and crackles, 95 on RA Gastrointestinal: Soft and nontender.  Musculoskeletal: Normal range of motion in all extremities. Pitting edema of BLE equal Neurologic: Normal speech and language. No gross focal neurologic deficits are appreciated. Skin: Skin is warm, dry and intact. No rash noted. Psychiatric: Mood and affect are normal. Speech and behavior are normal.  ED Results / Procedures / Treatments   Labs (all labs ordered are listed, but only abnormal results are displayed) Labs Reviewed  COMPREHENSIVE METABOLIC PANEL - Abnormal; Notable for the following components:      Result  Value   CO2 20 (*)    Glucose, Bld 131 (*)    Calcium 8.7 (*)    Total Protein 6.2 (*)    Albumin 2.7 (*)    All other components within normal limits  CBC WITH DIFFERENTIAL/PLATELET - Abnormal; Notable for the following components:   WBC 13.9 (*)    Hemoglobin 10.9 (*)    HCT 35.7 (*)    RDW 17.1 (*)    Neutro Abs 12.1 (*)    All other components within normal limits  BRAIN NATRIURETIC PEPTIDE - Abnormal; Notable for the following components:   B Natriuretic Peptide 293.8 (*)    All other components within normal limits  I-STAT VENOUS BLOOD GAS, ED - Abnormal; Notable for the following components:   pCO2, Ven 37.6 (*)    pO2, Ven 50 (*)    Acid-base deficit 3.0 (*)    Calcium, Ion 1.11 (*)    HCT  35.0 (*)    Hemoglobin 11.9 (*)    All other components within normal limits  TROPONIN I (HIGH SENSITIVITY) - Abnormal; Notable for the following components:   Troponin I (High Sensitivity) 26 (*)    All other components within normal limits  TROPONIN I (HIGH SENSITIVITY) - Abnormal; Notable for the following components:   Troponin I (High Sensitivity) 31 (*)    All other components within normal limits  BASIC METABOLIC PANEL  MAGNESIUM  CBC    EKG EKG Interpretation  Date/Time:  Monday January 18 2023 08:42:50 EDT Ventricular Rate:  173 PR Interval:  36 QRS Duration: 98 QT Interval:  275 QTC Calculation: 467 R Axis:   77 Text Interpretation: Supraventricular tachycardia Low voltage, precordial leads Anteroseptal infarct, old Repolarization abnormality, prob rate related Artifact in lead(s) I III aVR aVL and baseline wander in lead(s) V1 Confirmed by Vivien Rossetti ([id number]) on 01/18/2023 10:13:01 AM  Radiology DG Chest Portable 1 View  Result Date: 01/18/2023 CLINICAL DATA:  Shortness of breath. EXAM: PORTABLE CHEST 1 VIEW COMPARISON:  Chest x-ray dated December 04, 2022. FINDINGS: The patient is rotated to the right. Unchanged mild cardiomegaly. Increased interstitial  thickening at both lung bases with layering small bilateral pleural effusions and mild bibasilar atelectasis. No pneumothorax. No acute osseous abnormality. IMPRESSION: 1. Mild congestive heart failure. Electronically Signed   By: Obie Dredge M.D.   On: 01/18/2023 10:25    Procedures .Critical Care  Performed by: Mardene Sayer, MD Authorized by: Mardene Sayer, MD   Critical care provider statement:    Critical care time (minutes):  45   Critical care was necessary to treat or prevent imminent or life-threatening deterioration of the following conditions:  Cardiac failure and respiratory failure   Critical care was time spent personally by me on the following activities:  Development of treatment plan with patient or surrogate, evaluation of patient's response to treatment, examination of patient, ordering and review of laboratory studies, ordering and review of radiographic studies, ordering and performing treatments and interventions, pulse oximetry, re-evaluation of patient's condition, review of old charts and obtaining history from patient or surrogate   Care discussed with: admitting provider       Medications Ordered in ED Medications  apixaban (ELIQUIS) tablet 5 mg (has no administration in time range)  albuterol (PROVENTIL) (2.5 MG/3ML) 0.083% nebulizer solution 3 mL (has no administration in time range)  atorvastatin (LIPITOR) tablet 80 mg (80 mg Oral Given 01/18/23 1326)  ARIPiprazole (ABILIFY) tablet 10 mg (10 mg Oral Given 01/18/23 1343)  carbidopa-levodopa (SINEMET IR) 25-100 MG per tablet immediate release 1 tablet (1 tablet Oral Given 01/18/23 1617)  umeclidinium bromide (INCRUSE ELLIPTA) 62.5 MCG/ACT 1 puff (1 puff Inhalation Not Given 01/18/23 1307)  mometasone-formoterol (DULERA) 100-5 MCG/ACT inhaler 2 puff (2 puffs Inhalation Not Given 01/18/23 1307)  buprenorphine-naloxone (SUBOXONE) 8-2 mg per SL tablet 1 tablet (1 tablet Sublingual Given 01/18/23 1617)   DULoxetine (CYMBALTA) DR capsule 60 mg (60 mg Oral Given 01/18/23 1326)  gabapentin (NEURONTIN) capsule 600 mg (600 mg Oral Given 01/18/23 1617)  losartan (COZAAR) tablet 25 mg (25 mg Oral Given 01/18/23 1325)  nicotine (NICODERM CQ - dosed in mg/24 hours) patch 14 mg (14 mg Transdermal Patch Applied 01/18/23 1326)  polyethylene glycol (MIRALAX / GLYCOLAX) packet 17 g (has no administration in time range)  senna-docusate (Senokot-S) tablet 1 tablet (has no administration in time range)  furosemide (LASIX) injection 40 mg (40 mg Intravenous Given 01/18/23 1806)  metoprolol tartrate (LOPRESSOR) tablet 50 mg (has no administration in time range)  ondansetron (ZOFRAN) injection 4 mg (has no administration in time range)  diltiazem (CARDIZEM) 1 mg/mL load via infusion 15 mg (15 mg Intravenous Bolus from Bag 01/18/23 0934)  ipratropium-albuterol (DUONEB) 0.5-2.5 (3) MG/3ML nebulizer solution 3 mL (3 mLs Nebulization Given 01/18/23 0929)  methylPREDNISolone sodium succinate (SOLU-MEDROL) 125 mg/2 mL injection 125 mg (125 mg Intravenous Given 01/18/23 0929)  furosemide (LASIX) injection 40 mg (40 mg Intravenous Given 01/18/23 1020)  apixaban (ELIQUIS) tablet 5 mg (5 mg Oral Given 01/18/23 1138)    ED Course/ Medical Decision Making/ A&P Clinical Course as of 01/18/23 1946  Mon Jan 18, 2023  1133 Discussed with internal medicine team for admission for continued management of A-fib RVR and CHF exacerbation.  Cardiology team has already been consulted for further recommendations. [VB]    Clinical Course User Index [VB] Mardene Sayer, MD                             Medical Decision Making Sheryl Amezola Bartkus is a 67 y.o. female. With pmh CAD s/p DES, HTN, permanent a-fib on Eliquis, HLD, parkinson's, systolic HF, COPD, and polysubstance use presents with increased work of breathing and cough over the past week.    Patient presented in A-fib RVR due to noncompliance did not cardiovert and started on  Cardizem drip and bolus.  Additionally, she was having respiratory distress likely related to drug use, noncompliance and CHF.  She had increased swelling of her lower extremities and chest x-ray reviewed by me with cardiomegaly and bilateral pleural effusions.  Her labs were notable for a leukocytosis of 13.9 and left shift present.  Anemia hemoglobin 10.9.  BNP elevated in the 200s.  No hypercarbia on VBG.  Troponin stable and elevated at 31.  Start patient on IV diuresis and home Eliquis.  Also provided therapies for COPD with Solu-Medrol and DuoNebs.  Discussed with internal medicine team for admission and continued management.  Amount and/or Complexity of Data Reviewed Labs: ordered. Radiology: ordered.  Risk Prescription drug management. Decision regarding hospitalization.    Final Clinical Impression(s) / ED Diagnoses Final diagnoses:  Atrial fibrillation with RVR (HCC)  SOB (shortness of breath)  Acute on chronic congestive heart failure, unspecified heart failure type [facility])    Rx / DC Orders ED Discharge Orders     None         Mardene Sayer, MD 01/18/23 1946

## 2023-01-18 NOTE — Progress Notes (Signed)
   01/18/23 2331  BiPAP/CPAP/SIPAP  Reason BIPAP/CPAP not in use Non-compliant

## 2023-01-19 ENCOUNTER — Observation Stay (HOSPITAL_COMMUNITY): Payer: 59

## 2023-01-19 ENCOUNTER — Other Ambulatory Visit (HOSPITAL_COMMUNITY): Payer: Self-pay

## 2023-01-19 DIAGNOSIS — J9621 Acute and chronic respiratory failure with hypoxia: Secondary | ICD-10-CM | POA: Diagnosis present

## 2023-01-19 DIAGNOSIS — F17290 Nicotine dependence, other tobacco product, uncomplicated: Secondary | ICD-10-CM | POA: Diagnosis present

## 2023-01-19 DIAGNOSIS — E119 Type 2 diabetes mellitus without complications: Secondary | ICD-10-CM | POA: Diagnosis not present

## 2023-01-19 DIAGNOSIS — I959 Hypotension, unspecified: Secondary | ICD-10-CM | POA: Diagnosis not present

## 2023-01-19 DIAGNOSIS — Z5900 Homelessness unspecified: Secondary | ICD-10-CM | POA: Diagnosis not present

## 2023-01-19 DIAGNOSIS — Z79899 Other long term (current) drug therapy: Secondary | ICD-10-CM | POA: Diagnosis not present

## 2023-01-19 DIAGNOSIS — I4821 Permanent atrial fibrillation: Secondary | ICD-10-CM | POA: Diagnosis present

## 2023-01-19 DIAGNOSIS — F32A Depression, unspecified: Secondary | ICD-10-CM

## 2023-01-19 DIAGNOSIS — G20A1 Parkinson's disease without dyskinesia, without mention of fluctuations: Secondary | ICD-10-CM | POA: Diagnosis present

## 2023-01-19 DIAGNOSIS — F17210 Nicotine dependence, cigarettes, uncomplicated: Secondary | ICD-10-CM | POA: Diagnosis present

## 2023-01-19 DIAGNOSIS — I5023 Acute on chronic systolic (congestive) heart failure: Secondary | ICD-10-CM

## 2023-01-19 DIAGNOSIS — Z955 Presence of coronary angioplasty implant and graft: Secondary | ICD-10-CM | POA: Diagnosis not present

## 2023-01-19 DIAGNOSIS — M797 Fibromyalgia: Secondary | ICD-10-CM | POA: Diagnosis present

## 2023-01-19 DIAGNOSIS — E785 Hyperlipidemia, unspecified: Secondary | ICD-10-CM | POA: Diagnosis present

## 2023-01-19 DIAGNOSIS — F1110 Opioid abuse, uncomplicated: Secondary | ICD-10-CM | POA: Diagnosis present

## 2023-01-19 DIAGNOSIS — I051 Rheumatic mitral insufficiency: Secondary | ICD-10-CM | POA: Diagnosis present

## 2023-01-19 DIAGNOSIS — I5021 Acute systolic (congestive) heart failure: Secondary | ICD-10-CM | POA: Diagnosis not present

## 2023-01-19 DIAGNOSIS — Z881 Allergy status to other antibiotic agents status: Secondary | ICD-10-CM | POA: Diagnosis not present

## 2023-01-19 DIAGNOSIS — B182 Chronic viral hepatitis C: Secondary | ICD-10-CM

## 2023-01-19 DIAGNOSIS — Z7901 Long term (current) use of anticoagulants: Secondary | ICD-10-CM | POA: Diagnosis not present

## 2023-01-19 DIAGNOSIS — I509 Heart failure, unspecified: Secondary | ICD-10-CM | POA: Diagnosis not present

## 2023-01-19 DIAGNOSIS — G894 Chronic pain syndrome: Secondary | ICD-10-CM

## 2023-01-19 DIAGNOSIS — I4891 Unspecified atrial fibrillation: Secondary | ICD-10-CM | POA: Diagnosis not present

## 2023-01-19 DIAGNOSIS — I2510 Atherosclerotic heart disease of native coronary artery without angina pectoris: Secondary | ICD-10-CM | POA: Diagnosis present

## 2023-01-19 DIAGNOSIS — J449 Chronic obstructive pulmonary disease, unspecified: Secondary | ICD-10-CM | POA: Diagnosis present

## 2023-01-19 DIAGNOSIS — F329 Major depressive disorder, single episode, unspecified: Secondary | ICD-10-CM | POA: Diagnosis present

## 2023-01-19 DIAGNOSIS — G4733 Obstructive sleep apnea (adult) (pediatric): Secondary | ICD-10-CM | POA: Diagnosis present

## 2023-01-19 DIAGNOSIS — Z7984 Long term (current) use of oral hypoglycemic drugs: Secondary | ICD-10-CM

## 2023-01-19 DIAGNOSIS — R0602 Shortness of breath: Secondary | ICD-10-CM | POA: Diagnosis present

## 2023-01-19 DIAGNOSIS — F1990 Other psychoactive substance use, unspecified, uncomplicated: Secondary | ICD-10-CM

## 2023-01-19 DIAGNOSIS — I110 Hypertensive heart disease with heart failure: Secondary | ICD-10-CM | POA: Diagnosis present

## 2023-01-19 LAB — CBC
HCT: 33.8 % — ABNORMAL LOW (ref 36.0–46.0)
Hemoglobin: 10.6 g/dL — ABNORMAL LOW (ref 12.0–15.0)
MCH: 27.5 pg (ref 26.0–34.0)
MCHC: 31.4 g/dL (ref 30.0–36.0)
MCV: 87.8 fL (ref 80.0–100.0)
Platelets: 252 10*3/uL (ref 150–400)
RBC: 3.85 MIL/uL — ABNORMAL LOW (ref 3.87–5.11)
RDW: 17.1 % — ABNORMAL HIGH (ref 11.5–15.5)
WBC: 9.9 10*3/uL (ref 4.0–10.5)
nRBC: 0 % (ref 0.0–0.2)

## 2023-01-19 LAB — ECHOCARDIOGRAM LIMITED
Calc EF: 40.7 %
Height: 64 in
MV M vel: 4.35 m/s
MV Peak grad: 75.7 mmHg
P 1/2 time: 334 msec
Radius: 0.3 cm
S' Lateral: 4.6 cm
Single Plane A2C EF: 40.6 %
Single Plane A4C EF: 41.8 %
Weight: 3504 oz

## 2023-01-19 LAB — BASIC METABOLIC PANEL
Anion gap: 10 (ref 5–15)
BUN: 12 mg/dL (ref 8–23)
CO2: 25 mmol/L (ref 22–32)
Calcium: 8.7 mg/dL — ABNORMAL LOW (ref 8.9–10.3)
Chloride: 99 mmol/L (ref 98–111)
Creatinine, Ser: 0.89 mg/dL (ref 0.44–1.00)
GFR, Estimated: >60 mL/min (ref >60)
Glucose, Bld: 154 mg/dL — ABNORMAL HIGH (ref 70–99)
Potassium: 4 mmol/L (ref 3.5–5.1)
Sodium: 134 mmol/L — ABNORMAL LOW (ref 135–145)

## 2023-01-19 LAB — MRSA NEXT GEN BY PCR, NASAL: MRSA by PCR Next Gen: NOT DETECTED

## 2023-01-19 LAB — MAGNESIUM: Magnesium: 2 mg/dL (ref 1.7–2.4)

## 2023-01-19 MED ORDER — RIVAROXABAN 20 MG PO TABS
20.0000 mg | ORAL_TABLET | Freq: Every day | ORAL | Status: DC
  Administered 2023-01-20 – 2023-01-21 (×2): 20 mg via ORAL
  Filled 2023-01-19 (×2): qty 1

## 2023-01-19 MED ORDER — APIXABAN 5 MG PO TABS
5.0000 mg | ORAL_TABLET | Freq: Two times a day (BID) | ORAL | Status: AC
  Administered 2023-01-19: 5 mg via ORAL
  Filled 2023-01-19: qty 1

## 2023-01-19 NOTE — TOC Benefit Eligibility Note (Signed)
Patient Advocate Encounter  Insurance verification completed.    The patient is currently admitted and upon discharge could be taking Xarelto 20 mg.  The current 30 day co-pay is $0.00.   The patient is insured through [employer] Medicare Part D   This test claim was processed through [facility]- copay amounts may vary at other pharmacies due to pharmacy/plan contracts, or as the patient moves through the different stages of their insurance plan.  Tyashia Morrisette, CPHT Pharmacy Patient Advocate Specialist [facility] Patient Advocate Team Direct Number: [phone]  Fax: [phone]       

## 2023-01-19 NOTE — Progress Notes (Signed)
Patient refused CPAP for the night  

## 2023-01-19 NOTE — Progress Notes (Signed)
Echocardiogram 2D Echocardiogram has been performed.  Autumn [name] 01/19/2023, 9:54 AM

## 2023-01-19 NOTE — Hospital Course (Signed)
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    #Atrial fibrillation with RVR Patient presented with a 1-day history of dyspnea and LE edema found to be in A-fib with RVR. She was previously prescribed 2L [place] home oxygen but has been without home oxygen for 2 months as she lost her apartment and was briefly homeless.  She was started on diltiazem drip but transition to metoprolol.  Rate was well-controlled on metoprolol 150 mg.  Transition from Eliquis to Xarelto for better medication adherence after discharge.  Patient to continue metoprolol succinate 150 mg daily and Xarelto 20 mg daily.   #Acute on chronic systolic heart failure Presented with dyspnea and LE edema. Repeat echo showed EF decreased to 35-40 percent from 40-45 percent in April 2024 with global hypokinesis and moderate to severe MR (previously only mild MR). Started on IV Lasix during hospital course then transition back to her home p.o. Lasix 40 mg daily. Weight at discharge was 99.8 kg. Home Farxiga 10 mg was restarted but losartan was held due to low normal BP. Can reassess in outpatient setting regarding restarting losartan.  #Acute on chronic respiratory failure Suspect initial dyspnea was of cardiac origin in the setting of atrial fibrillation and HFrEF. Had been without her home O2 for 2 months. Noted some diffuse wheezing during hospital course which resolved by day of discharge. Home inhalers were continued throughout hospitalization. DME for home O2 placed and supplies given prior to discharge. Will continue at discharge.   #Polysubstance use disorder Patient  continues to use methamphetamines and marijuana. She also takes suboxone 8-2 mg TID for opioid use disorder at home. Patient received nicotine patch and home suboxone throughout hospitalization. Will continue suboxone at discharge.    #Type II diabetes mellitus Last A1c 6.1% in March. Blood glucose well-controlled throughout hospitalization.   #Parkinson's disease Patient follows with outpatient neurology. Symptoms stable. Home carbidopa-levodopa was continued throughout hospitalization. Will continue at discharge.   #CAD s/p stenting Patient denied chest pain on admission. Troponin flat at 26>31. ECG without changes from prior. Home statin was continued throughout hospitalization. Will continue at discharge.   #Major depressive disorder Home Abilify was continued throughout hospitalization. Will continue at discharge.   #Chronic pain syndrome Home duloxetine and gabapentin was continued throughout hospitalization. Will continue at discharge.   #Chronic hepatitis C infection Unclear if this has been treated or not. Noted in the chart dating back to at least 2008. No clinical signs of cirrhosis or synthetic dysfunction. Recommend outpatient follow-up.

## 2023-01-19 NOTE — Progress Notes (Signed)
Pt desats when asleep, Encouraged Cpap use and educated, however pt still refusing. O2 increased @ 3L [place]. Plan of care ongoing.

## 2023-01-19 NOTE — Progress Notes (Signed)
Subjective:  Overnight events: Patient was intermittently hypotensive overnight. Evening metoprolol dose was held. Morning losartan was held.  Patient feeling much better this morning. She reports her breathing is back to baseline. She was able to ambulate to the bathroom without difficulty. She feels like her lower extremity edema has also improved. Her appetite is good as she ate a snack this morning in addition to breakfast. She denies chest pain, palpitations, lightheadedness, dizziness, nausea, or vomiting.    Objective:  Vital signs in last 24 hours: Vitals:   01/19/23 0900 01/19/23 0951 01/19/23 1000 01/19/23 1100  BP: (!) 95/50  113/86 103/72  Pulse: (!) 123  (!) 111 (!) 109  Resp: (!) 24  13 14   Temp:  98.1 F (36.7 C)    TempSrc:  Oral    SpO2: 99%  95% 95%  Weight:      Height:       Weight change:   Intake/Output Summary (Last 24 hours) at 01/19/2023 1313 Last data filed at 01/18/2023 1917 Gross per 24 hour  Intake --  Output 600 ml  Net -600 ml   Physical Exam: GENERAL: Chronically ill-appearing female sitting comfortably in bed. Interactive and in good spirits. No acute distress. CV: Tachycardic rate, irregularly irregular rhythm. No murmurs appreciated. Warm extremities.  PULM: Normal work of breathing on 1L [place]. Diffuse expiratory wheezes bilaterally. GI: Abdomen soft, non-tender, non-distended. Normoactive bowel sounds.  MSK: Normal bulk, tone. Trace pitting edema bilaterally.  SKIN: Warm, dry. No rashes or lesions noted.  NEURO: Awake, alert, conversing appropriately. Moving all four extremities spontaneously.  PSYCH: Normal mood, affect.    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   #Atrial fibrillation with RVR Patient denies experiencing any chest pain or palpitations. She remains in atrial fibrillation with rates around 100-110s. Will continue rate control with metoprolol. Plan to transition from Eliquis to Xarelto tomorrow to help with medication adherance at discharge. - Appreciate cardiology recommendations - Start Xarelto 20 mg daily on 6/12 - Metoprolol tartrate 50 mg TID  #Acute on chronic heart failure Patient is now saturating well on 1L [place], down from her baseline of 2L  at home. Lower extremity edema has improved on exam. Repeat echocardiogram showed a reduced ejection fraction (35 to 40%) from prior echo (40 to 45%) with global hypokinesis and moderate to severe MR (previously only mild MR). Will continue IV Lasix 40 mg BID for now. Anticipate transition to PO lasix tomorrow. Will continue holding losartan and Farxiga due to low-normal blood pressure. - Appreciate cardiology recommendations - Hold losartan 25 mg daily - Hold Farxiga 10 mg daily - IV Lasix 40 mg BID  - Strict I&Os, daily weights  #Acute on chronic respiratory failure Patient reports breathing feels back to baseline. Diffuse expiratory wheezing was noted on exam today. Suspect initial dyspnea was of cardiac origin in the setting of atrial fibrillation and HFrEF. Will continue home medications while hospitalized. - Dulera 2 puffs daily - Incruse Ellipta 1 puff daily  #Polysubstance use disorder Patient continues to use  methamphetamines and marijuana. She also takes suboxone three times daily for opioid use disorder at home. Will continue suboxone and nicotine patch while hospitalized.  - Suboxone 8-2mg  TID - Nicotine patch -  TOC consult  #Type II diabetes mellitus Blood glucose well-controlled between 131-154. Will continue SSI while hospitalized. - SSI   #Parkinson's disease Stable, follows with outpatient neurology.  - Continue home carbidopa-levodopa    #CAD s/p stenting No chest pain, troponin flat, ECG without changes. - Continue home statin   #Major depressive disorder - Continue home Abilify   #Chronic pain syndrome - Continue home duloxetine, gabapentin   #Chronic hepatitis C infection Unclear if this has been treated or not. Noted in the chart dating back to at least 2008. No clinical signs of cirrhosis or synthetic dysfunction. - Follow-up outpatient   Diet: HH Bowel: Senna VTE: Eliquis IVF: None Code: Full LOS: day 1  Whitman Hero, Medical Student 01/19/2023, 1:13 PM On call pager: [phone]

## 2023-01-19 NOTE — ED Notes (Signed)
ED TO INPATIENT HANDOFF REPORT  ED Nurse Name and Phone #: Brett Canales [phone]  S Name/Age/Gender Autumn [name] 67 y.o. female Room/Bed: 042C/042C  Code Status   Code Status: Full Code  Home/SNF/Other Home Patient oriented to: self, place, time, and situation Is this baseline? Yes   Triage Complete: Triage complete  Chief Complaint Atrial fibrillation with rapid ventricular response (HCC) [I48.91]  Triage Note Pt BIB EMS from home with SOB since last night. Pt has hx of afib and COPD. Upon EMS arrival pt SaO2 70%, cPAP and duoneb improved symptoms to SaO2 100% and improved breathing. Pt yelling "I did not want to come to the hospital" and is upset. Pt presents with labored breathing while on neb treatment.   EMS VS 200 HR 101/82 100 %    Allergies Allergies  Allergen Reactions   Pamelor [Nortriptyline Hcl] Anaphylaxis   Vibra-Tab [Doxycycline] Other (See Comments)    Unknown reaction    Level of Care/Admitting Diagnosis ED Disposition     ED Disposition  Admit   Condition  --   Comment  Hospital Area: [facility] [[id number]]  Level of Care: Telemetry Medical [104]  May place patient in observation at [facility] or [facility] if equivalent level of care is available:: No  Covid Evaluation: Asymptomatic - no recent exposure (last 10 days) testing not required  Diagnosis: Atrial fibrillation with rapid ventricular response [facility]) [[id number]]  Admitting Physician: Earl Lagos [[id number]]  Attending Physician: Earl Lagos [id number]          B Medical/Surgery History Past Medical History:  Diagnosis Date   Anxiety    Arthritis    Attention deficit disorder (ADD)    COPD (chronic obstructive pulmonary disease) (HCC)    Depression    Drug abuse (HCC)    Fibromyalgia    HFrEF (heart failure with reduced ejection fraction) (HCC)    Hyperlipidemia    Paroxysmal Atrial fibrillation    Past Surgical History:  Procedure Laterality Date    ANKLE SURGERY     FACIAL COSMETIC SURGERY       A IV Location/Drains/Wounds Patient Lines/Drains/Airways Status     Active Line/Drains/Airways     Name Placement date Placement time Site Days   Peripheral IV 01/18/23 20 G Posterior;Right Forearm 01/18/23  0924  Forearm  1            Intake/Output Last 24 hours  Intake/Output Summary (Last 24 hours) at 01/19/2023 1202 Last data filed at 01/18/2023 1917 Gross per 24 hour  Intake --  Output 600 ml  Net -600 ml    Labs/Imaging Results for orders placed or performed during the hospital encounter of 01/18/23 (from the past 48 hour(s))  Brain natriuretic peptide     Status: Abnormal   Collection Time: 01/18/23  9:02 AM  Result Value Ref Range   B Natriuretic Peptide 293.8 (H) 0.0 - 100.0 pg/mL    Comment: Performed at [facility], [address], [place], [place] [address]  Comprehensive metabolic panel     Status: Abnormal   Collection Time: 01/18/23  9:35 AM  Result Value Ref Range   Sodium 136 135 - 145 mmol/L   Potassium 3.5 3.5 - 5.1 mmol/L   Chloride 103 98 - 111 mmol/L   CO2 20 (L) 22 - 32 mmol/L   Glucose, Bld 131 (H) 70 - 99 mg/dL    Comment: Glucose reference range applies only to samples taken after fasting for at least  8 hours.   BUN 8 8 - 23 mg/dL   Creatinine, Ser 1.61 0.44 - 1.00 mg/dL   Calcium 8.7 (L) 8.9 - 10.3 mg/dL   Total Protein 6.2 (L) 6.5 - 8.1 g/dL   Albumin 2.7 (L) 3.5 - 5.0 g/dL   AST 39 15 - 41 U/L   ALT 26 0 - 44 U/L   Alkaline Phosphatase 73 38 - 126 U/L   Total Bilirubin 0.5 0.3 - 1.2 mg/dL   GFR, Estimated >09 >60 mL/min    Comment: (NOTE) Calculated using the CKD-EPI Creatinine Equation (2021)    Anion gap 13 5 - 15    Comment: Performed at [facility], [address], [place], [place] [address]  CBC with Differential     Status: Abnormal   Collection Time: 01/18/23  9:35 AM  Result Value Ref Range   WBC 13.9 (H) 4.0 - 10.5 K/uL   RBC 4.04 3.87 - 5.11 MIL/uL    Hemoglobin 10.9 (L) 12.0 - 15.0 g/dL   HCT 81.1 (L) 91.4 - 78.2 %   MCV 88.4 80.0 - 100.0 fL   MCH 27.0 26.0 - 34.0 pg   MCHC 30.5 30.0 - 36.0 g/dL   RDW 95.6 (H) 21.3 - 08.6 %   Platelets 284 150 - 400 K/uL   nRBC 0.0 0.0 - 0.2 %   Neutrophils Relative % 87 %   Neutro Abs 12.1 (H) 1.7 - 7.7 K/uL   Lymphocytes Relative 8 %   Lymphs Abs 1.1 0.7 - 4.0 K/uL   Monocytes Relative 4 %   Monocytes Absolute 0.5 0.1 - 1.0 K/uL   Eosinophils Relative 1 %   Eosinophils Absolute 0.1 0.0 - 0.5 K/uL   Basophils Relative 0 %   Basophils Absolute 0.0 0.0 - 0.1 K/uL   Immature Granulocytes 0 %   Abs Immature Granulocytes 0.05 0.00 - 0.07 K/uL    Comment: Performed at [facility], [address], [place], [place] [address]  Troponin I (High Sensitivity)     Status: Abnormal   Collection Time: 01/18/23  9:35 AM  Result Value Ref Range   Troponin I (High Sensitivity) 26 (H) <18 ng/L    Comment: (NOTE) Elevated high sensitivity troponin I (hsTnI) values and significant  changes across serial measurements may suggest ACS but many other  chronic and acute conditions are known to elevate hsTnI results.  Refer to the "Links" section for chest pain algorithms and additional  guidance. Performed at [facility], [address] [place], [place] [address]   I-Stat venous blood gas, ED     Status: Abnormal   Collection Time: 01/18/23  9:45 AM  Result Value Ref Range   pH, Ven 7.370 7.25 - 7.43   pCO2, Ven 37.6 (L) 44 - 60 mmHg   pO2, Ven 50 (H) 32 - 45 mmHg   Bicarbonate 21.8 20.0 - 28.0 mmol/L   TCO2 23 22 - 32 mmol/L   O2 Saturation 84 %   Acid-base deficit 3.0 (H) 0.0 - 2.0 mmol/L   Sodium 140 135 - 145 mmol/L   Potassium 3.5 3.5 - 5.1 mmol/L   Calcium, Ion 1.11 (L) 1.15 - 1.40 mmol/L   HCT 35.0 (L) 36.0 - 46.0 %   Hemoglobin 11.9 (L) 12.0 - 15.0 g/dL   Sample type VENOUS   Troponin I (High Sensitivity)     Status: Abnormal   Collection Time: 01/18/23 11:08 AM  Result Value Ref  Range   Troponin I (High Sensitivity) 31 (H) <18 ng/L    Comment: (NOTE) Elevated high sensitivity troponin I (hsTnI) values and significant  changes across serial measurements may suggest ACS but many other  chronic and acute conditions are known to elevate hsTnI results.  Refer to the "Links" section for chest pain algorithms and additional  guidance. Performed at [facility], [address], [place], [place] [address]   Basic metabolic panel     Status: Abnormal   Collection Time: 01/19/23  2:40 AM  Result Value Ref Range   Sodium 134 (L) 135 - 145 mmol/L   Potassium 4.0 3.5 - 5.1 mmol/L   Chloride 99 98 - 111 mmol/L   CO2 25 22 - 32 mmol/L   Glucose, Bld 154 (H) 70 - 99 mg/dL    Comment: Glucose reference range applies only to samples taken after fasting for at least 8 hours.   BUN 12 8 - 23 mg/dL   Creatinine, Ser 6.04 0.44 - 1.00 mg/dL   Calcium 8.7 (L) 8.9 - 10.3 mg/dL   GFR, Estimated >54 >09 mL/min    Comment: (NOTE) Calculated using the CKD-EPI Creatinine Equation (2021)    Anion gap 10 5 - 15    Comment: Performed at [facility], [address], [place], [place] [address]  Magnesium     Status: None   Collection Time: 01/19/23  2:40 AM  Result Value Ref Range   Magnesium 2.0 1.7 - 2.4 mg/dL    Comment: Performed at [facility], [address], [place], [place] [address]  CBC     Status: Abnormal   Collection Time: 01/19/23  2:40 AM  Result Value Ref Range   WBC 9.9 4.0 - 10.5 K/uL   RBC 3.85 (L) 3.87 - 5.11 MIL/uL   Hemoglobin 10.6 (L) 12.0 - 15.0 g/dL   HCT 56.2 (L) 13.0 - 86.5 %   MCV 87.8 80.0 - 100.0 fL   MCH 27.5 26.0 - 34.0 pg   MCHC 31.4 30.0 - 36.0 g/dL   RDW 78.4 (H) 69.6 - 29.5 %   Platelets 252 150 - 400 K/uL   nRBC 0.0 0.0 - 0.2 %    Comment: Performed at [facility], [address], [place], [place] [address]   ECHOCARDIOGRAM LIMITED  Result Date: 01/19/2023    ECHOCARDIOGRAM LIMITED REPORT   Patient Name:   EDMUND KIEN Hammen Date of Exam: 01/19/2023 Medical Rec #:  [id number]       Height:       64.0 in Accession #:    [id number]      Weight:       219.0 lb Date of Birth:  [birth year]       BSA:          2.033 m Patient Age:    66 years        BP:           99/68 mmHg Patient Gender: F               HR:           99 bpm. Exam Location:  Inpatient Procedure: Limited Echo, Limited Color Doppler and Cardiac Doppler Indications:    CHF-Acute Systolic I50.21  History:        Patient has prior history of Echocardiogram examinations, most  recent 12/03/2022. COPD, Arrythmias:Atrial Fibrillation; Risk                 Factors:Dyslipidemia.  Sonographer:    Eulah Pont RDCS Referring Phys: [id number] CHRISTOPHER L SCHUMANN IMPRESSIONS  1. Left ventricular ejection fraction, by estimation, is 35 to 40%. The left ventricle has moderately decreased function. The left ventricle demonstrates global hypokinesis. The left ventricular internal cavity size was mildly dilated. Left ventricular diastolic function could not be evaluated.  2. Right ventricular systolic function is moderately reduced. The right ventricular size is mildly enlarged. There is mildly elevated pulmonary artery systolic pressure. The estimated right ventricular systolic pressure is 43.3 mmHg.  3. The mitral valve is degenerative. Moderate to severe mitral valve regurgitation. No evidence of mitral stenosis.  4. The aortic valve is tricuspid. Aortic valve regurgitation is mild. Aortic valve sclerosis is present, with no evidence of aortic valve stenosis.  5. The inferior vena cava is dilated in size with <50% respiratory variability, suggesting right atrial pressure of 15 mmHg. Comparison(s): No significant change from prior study. EF remains reduced and appears 35-40% on this study. FINDINGS  Left Ventricle: Left ventricular ejection fraction, by estimation, is 35 to 40%. The left ventricle has moderately decreased function. The left ventricle demonstrates  global hypokinesis. The left ventricular internal cavity size was mildly dilated. There is no left ventricular hypertrophy. Left ventricular diastolic function could not be evaluated. Left ventricular diastolic function could not be evaluated due to atrial fibrillation. Right Ventricle: The right ventricular size is mildly enlarged. No increase in right ventricular wall thickness. Right ventricular systolic function is moderately reduced. There is mildly elevated pulmonary artery systolic pressure. The tricuspid regurgitant velocity is 2.66 m/s, and with an assumed right atrial pressure of 15 mmHg, the estimated right ventricular systolic pressure is 43.3 mmHg. Pericardium: There is no evidence of pericardial effusion. Mitral Valve: The mitral valve is degenerative in appearance. Mild mitral annular calcification. Moderate to severe mitral valve regurgitation. No evidence of mitral valve stenosis. Tricuspid Valve: The tricuspid valve is grossly normal. Tricuspid valve regurgitation is mild . No evidence of tricuspid stenosis. Aortic Valve: The aortic valve is tricuspid. Aortic valve regurgitation is mild. Aortic regurgitation PHT measures 334 msec. Aortic valve sclerosis is present, with no evidence of aortic valve stenosis. Aorta: The aortic root and ascending aorta are structurally normal, with no evidence of dilitation. Venous: The inferior vena cava is dilated in size with less than 50% respiratory variability, suggesting right atrial pressure of 15 mmHg. Additional Comments: Spectral Doppler performed. Color Doppler performed.  LEFT VENTRICLE PLAX 2D LVIDd:         5.60 cm LVIDs:         4.60 cm LV PW:         1.00 cm LV IVS:        1.00 cm LVOT diam:     2.10 cm LV SV:         51 LV SV Index:   25 LVOT Area:     3.46 cm  LV Volumes (MOD) LV vol d, MOD A2C: 180.0 ml LV vol d, MOD A4C: 152.0 ml LV vol s, MOD A2C: 107.0 ml LV vol s, MOD A4C: 88.4 ml LV SV MOD A2C:     73.0 ml LV SV MOD A4C:     152.0 ml LV SV  MOD BP:      67.4 ml RIGHT VENTRICLE TAPSE (M-mode): 1.3 cm LEFT ATRIUM  Index LA diam:    5.60 cm 2.75 cm/m  AORTIC VALVE LVOT Vmax:   99.30 cm/s LVOT Vmean:  68.667 cm/s LVOT VTI:    0.147 m AI PHT:      334 msec  AORTA Ao Root diam: 3.20 cm Ao Asc diam:  3.70 cm MR Peak grad:    75.7 mmHg    TRICUSPID VALVE MR Mean grad:    54.0 mmHg    TR Peak grad:   28.3 mmHg MR Vmax:         435.00 cm/s  TR Vmax:        266.00 cm/s MR Vmean:        355.0 cm/s MR PISA:         0.57 cm     SHUNTS MR PISA Eff ROA: 5 mm        Systemic VTI:  0.15 m MR PISA Radius:  0.30 cm      Systemic Diam: 2.10 cm Lennie Odor MD Electronically signed by Lennie Odor MD Signature Date/Time: 01/19/2023/10:40:39 AM    Final    DG Chest Portable 1 View  Result Date: 01/18/2023 CLINICAL DATA:  Shortness of breath. EXAM: PORTABLE CHEST 1 VIEW COMPARISON:  Chest x-ray dated December 04, 2022. FINDINGS: The patient is rotated to the right. Unchanged mild cardiomegaly. Increased interstitial thickening at both lung bases with layering small bilateral pleural effusions and mild bibasilar atelectasis. No pneumothorax. No acute osseous abnormality. IMPRESSION: 1. Mild congestive heart failure. Electronically Signed   By: Obie Dredge M.D.   On: 01/18/2023 10:25    Pending Labs Unresulted Labs (From admission, onward)    None       Vitals/Pain Today's Vitals   01/19/23 0900 01/19/23 0951 01/19/23 1000 01/19/23 1100  BP: (!) 95/50  113/86 103/72  Pulse: (!) 123  (!) 111 (!) 109  Resp: (!) 24  13 14   Temp:  98.1 F (36.7 C)    TempSrc:  Oral    SpO2: 99%  95% 95%  Weight:      Height:      PainSc:        Isolation Precautions No active isolations  Medications Medications  apixaban (ELIQUIS) tablet 5 mg (5 mg Oral Given 01/19/23 0830)  albuterol (PROVENTIL) (2.5 MG/3ML) 0.083% nebulizer solution 3 mL (has no administration in time range)  atorvastatin (LIPITOR) tablet 80 mg (80 mg Oral Given 01/19/23 0830)   ARIPiprazole (ABILIFY) tablet 10 mg (10 mg Oral Given 01/19/23 0830)  carbidopa-levodopa (SINEMET IR) 25-100 MG per tablet immediate release 1 tablet (1 tablet Oral Given 01/19/23 0830)  umeclidinium bromide (INCRUSE ELLIPTA) 62.5 MCG/ACT 1 puff (1 puff Inhalation Given 01/19/23 0829)  mometasone-formoterol (DULERA) 100-5 MCG/ACT inhaler 2 puff (2 puffs Inhalation Given 01/19/23 0829)  buprenorphine-naloxone (SUBOXONE) 8-2 mg per SL tablet 1 tablet (1 tablet Sublingual Given 01/19/23 0841)  DULoxetine (CYMBALTA) DR capsule 60 mg (60 mg Oral Given 01/19/23 0830)  gabapentin (NEURONTIN) capsule 600 mg (600 mg Oral Given 01/19/23 0830)  nicotine (NICODERM CQ - dosed in mg/24 hours) patch 14 mg (14 mg Transdermal Patch Applied 01/19/23 0838)  polyethylene glycol (MIRALAX / GLYCOLAX) packet 17 g (has no administration in time range)  senna-docusate (Senokot-S) tablet 1 tablet (1 tablet Oral Given 01/18/23 2118)  furosemide (LASIX) injection 40 mg (40 mg Intravenous Given 01/19/23 0829)  metoprolol tartrate (LOPRESSOR) tablet 50 mg (50 mg Oral Given 01/19/23 0830)  ondansetron (ZOFRAN) injection 4 mg (has no administration in time  range)  diltiazem (CARDIZEM) 1 mg/mL load via infusion 15 mg (15 mg Intravenous Bolus from Bag 01/18/23 0934)  ipratropium-albuterol (DUONEB) 0.5-2.5 (3) MG/3ML nebulizer solution 3 mL (3 mLs Nebulization Given 01/18/23 0929)  methylPREDNISolone sodium succinate (SOLU-MEDROL) 125 mg/2 mL injection 125 mg (125 mg Intravenous Given 01/18/23 0929)  furosemide (LASIX) injection 40 mg (40 mg Intravenous Given 01/18/23 1020)  apixaban (ELIQUIS) tablet 5 mg (5 mg Oral Given 01/18/23 1138)    Mobility Walks with a cane     Focused Assessments Cardiac Assessment Handoff:  Cardiac Rhythm: Atrial fibrillation Lab Results  Component Value Date   TROPONINI <0.03 06/27/2017   No results found for: "DDIMER" Does the Patient currently have chest pain? No    R Recommendations: See  Admitting Provider Note  Report given to:   Additional Notes:

## 2023-01-19 NOTE — Progress Notes (Signed)
Rounding Note    Patient Name: Autumn [name] Date of Encounter: 01/19/2023  [facility] Cardiologist: Thomasene Ripple, DO   Subjective   I/Os not recorded. BP 105/81.  Cr stable at 0.89.  Reports dyspnea improved  Inpatient Medications    Scheduled Meds:  apixaban  5 mg Oral BID   ARIPiprazole  10 mg Oral Daily   atorvastatin  80 mg Oral Daily   buprenorphine-naloxone  1 tablet Sublingual TID   carbidopa-levodopa  1 tablet Oral TID   DULoxetine  60 mg Oral Daily   furosemide  40 mg Intravenous BID   gabapentin  600 mg Oral TID   losartan  25 mg Oral Daily   metoprolol tartrate  50 mg Oral TID   mometasone-formoterol  2 puff Inhalation BID   nicotine  14 mg Transdermal Daily   senna-docusate  1 tablet Oral QHS   umeclidinium bromide  1 puff Inhalation Daily   Continuous Infusions:  PRN Meds: albuterol, ondansetron (ZOFRAN) IV, polyethylene glycol   Vital Signs    Vitals:   01/19/23 0500 01/19/23 0530 01/19/23 0600 01/19/23 0630  BP: 100/72 (!) 97/59 99/63 105/81  Pulse: 98 93 (!) 107 (!) 108  Resp: 15 15 20    Temp:   97.8 F (36.6 C)   TempSrc:   Oral   SpO2: 93% 94% 94% 99%  Weight:      Height:        Intake/Output Summary (Last 24 hours) at 01/19/2023 0806 Last data filed at 01/18/2023 1917 Gross per 24 hour  Intake --  Output 600 ml  Net -600 ml      01/18/2023    8:45 AM 12/07/2022    6:16 AM 12/06/2022    9:43 AM  Last 3 Weights  Weight (lbs) 219 lb 214 lb 3.2 oz 211 lb 8 oz  Weight (kg) 99.338 kg 97.16 kg 95.936 kg      Telemetry    Afib rates 90-110s - Personally Reviewed  ECG    No new ECG - Personally Reviewed  Physical Exam   GEN: No acute distress.   Neck:+ JVD Cardiac: irregular, tachycardic, no murmurs Respiratory: Expiratory wheezing GI: Soft, nontender, non-distended  MS: 1+ edema;  Neuro:  Nonfocal  Psych: Normal affect   Labs    High Sensitivity Troponin:   Recent Labs  Lab 01/18/23 0935 01/18/23 1108   TROPONINIHS 26* 31*     Chemistry Recent Labs  Lab 01/18/23 0935 01/18/23 0945 01/19/23 0240  NA 136 140 134*  K 3.5 3.5 4.0  CL 103  --  99  CO2 20*  --  25  GLUCOSE 131*  --  154*  BUN 8  --  12  CREATININE 0.85  --  0.89  CALCIUM 8.7*  --  8.7*  MG  --   --  2.0  PROT 6.2*  --   --   ALBUMIN 2.7*  --   --   AST 39  --   --   ALT 26  --   --   ALKPHOS 73  --   --   BILITOT 0.5  --   --   GFRNONAA >60  --  >60  ANIONGAP 13  --  10    Lipids No results for input(s): "CHOL", "TRIG", "HDL", "LABVLDL", "LDLCALC", "CHOLHDL" in the last 168 hours.  Hematology Recent Labs  Lab 01/18/23 0935 01/18/23 0945 01/19/23 0240  WBC 13.9*  --  9.9  RBC  4.04  --  3.85*  HGB 10.9* 11.9* 10.6*  HCT 35.7* 35.0* 33.8*  MCV 88.4  --  87.8  MCH 27.0  --  27.5  MCHC 30.5  --  31.4  RDW 17.1*  --  17.1*  PLT 284  --  252   Thyroid No results for input(s): "TSH", "FREET4" in the last 168 hours.  BNP Recent Labs  Lab 01/18/23 0902  BNP 293.8*    DDimer No results for input(s): "DDIMER" in the last 168 hours.   Radiology    DG Chest Portable 1 View  Result Date: 01/18/2023 CLINICAL DATA:  Shortness of breath. EXAM: PORTABLE CHEST 1 VIEW COMPARISON:  Chest x-ray dated December 04, 2022. FINDINGS: The patient is rotated to the right. Unchanged mild cardiomegaly. Increased interstitial thickening at both lung bases with layering small bilateral pleural effusions and mild bibasilar atelectasis. No pneumothorax. No acute osseous abnormality. IMPRESSION: 1. Mild congestive heart failure. Electronically Signed   By: Obie Dredge M.D.   On: 01/18/2023 10:25    Cardiac Studies     Patient Profile     67 y.o. female  with a hx of CAD s/p atherectomy and cutting balloon with stenting to mLAD 2020, chronic systolic heart failure, PAF, moderate MR, PAD, COPD, Parkinson's disorder, substance use, chronic respiratory failure, and hypertension who is being seen for the evaluation of A-fib RVR    Assessment & Plan    Atrial fibrillation with RVR: likely permanent afib. Takes 100 mg toprol at home without good rate control, per the patient.  Started on dilt gtt on admission.   - likely permanent afib, rate control strategy recommended - continue eliquis - continue lopressor 50 mg TID, appears dose was held overnight due to soft BP.  Rates mildly elevated this morning, would restart lopressor.  Will hold home losartan for more room with BP    Chronic anticoagulation On eliquis 5 mg BID without bleeding issues, but reports taking this only once daily or 10 mg once daily, unclear from patient interview.  - would transition to xarelto 20 mg daily at discharge to help with compliance     CAD with prior PCI/DES - LAD No ASA given eliquis - no chest pain - continue BB and statin     Acute on Chronic systolic heart failure Echo Jan and April 2024 with LVEF 40-45% - GDMT PTA: toprol, losartan, farxiga - repeat echo now that she is better rate controlled - continue IV lasix 40 mg BID - holding losartan due to soft BP, can titrate GDMT as able   Hypertension - 0.1 mg clonidine BID listed on her home medications, but likely not a good choice for her given noncompliance and reports of taking all medications once daily - Soft BP overnight, will hold losartan for now.  Continue metoprolol for rate control   Acute on Chronic respiratory failure Likely multifactorial given being out of O2 for 2 months, CHF, COPD, and Afib RVR - continue diuresis as above   COPD On 2 L [place]     Parkinson's disease Polysubstance abuse OSA Current smoker - does not want to quit Per primary    For questions or updates, please contact [facility] Please consult [email or web address] for contact info under        Signed, Little Ishikawa, MD  01/19/2023, 8:06 AM

## 2023-01-19 NOTE — Progress Notes (Signed)
Internal Medicine Attending:   I saw and examined the patient. I reviewed the resident's H&P note and I agree with the resident's findings and plan as documented in the resident's note.  In brief, patient is 67 year old female with past medical history of heart failure with mild reduced ejection fraction (EF 40 to 45%), paroxysmal A-fib on anticoagulation, CAD status post stent, chronic hypoxic respiratory failure on supplemental O2, COPD, OSA, type 2 diabetes and polysubstance use disorder who presented to the ED with progressively worsening shortness of breath over the last couple of days.  Patient states that she was evicted from her apartment and was unable to take her oxygen with her.  She has moved in with her son since then but has not had her oxygen in a week.  She notes progressively worsening shortness of breath with dyspnea on exertion over the last couple of days.  She presented to ED for further evaluation and was found to be in A-fib with RVR with pulmonary vascular congestion noted on chest x-ray consistent with an acute on chronic systolic heart failure exacerbation.  On exam, patient is lying in bed in no apparent distress.  Lungs with bilateral diffuse expiratory wheeze noted on exam.  Abdomen is soft, nontender, nondistended with normoactive bowel sounds.  Cardiovascular exam reveals irregularly irregular rhythm with mild tachycardia.  Lower extremities are nontender to palpation with 1+ bilateral lower extremity edema noted.  Patient has normal mood and affect.  Assessment and plan:  1.  A-fib with RVR: Patient was admitted to the hospital with acute on chronic systolic heart failure exacerbation in setting of recurrent A-fib with RVR.  -Cardiology follow-up recommendations appreciated.   -Patient was initially started on diltiazem drip which has been subsequently discontinued.  Continue with metoprolol 50 mg 3 times daily for now. -Patient was noted to be hypotensive overnight with  systolics in 70s.  Her losartan was held to allow her to continue with her metoprolol for rate control.  Will monitor BP closely. -Patient remains in A-fib today.  Will continue with anticoagulation with Eliquis for now.  Would transition to Xarelto on discharge as patient has had issues with taking pills twice daily.   2.  Acute on chronic systolic heart failure exacerbation: -Patient presented to the ED with dyspnea and was noted to have recurrent A-fib with RVR as well as pulmonary vascular congestion on her chest x-ray consistent with an acute on chronic systolic heart for exacerbation. -Repeat echo done today showed a reduced ejection fraction (35 to 40%) from prior echo (40 to 45%) with global hypokinesis and moderate to severe MR (previously only mild MR noted).   -Will continue with IV Lasix 40 mg twice daily for now.   -Strict I's and O's and daily weights.   -Will continue to hold losartan secondary to borderline blood pressures.   -Continue with Marcelline Deist for now.   -Will need to discuss further workup with cardiology given patient now has moderate to severe MR and worsening EF.

## 2023-01-20 ENCOUNTER — Other Ambulatory Visit (HOSPITAL_COMMUNITY): Payer: Self-pay

## 2023-01-20 DIAGNOSIS — J9621 Acute and chronic respiratory failure with hypoxia: Secondary | ICD-10-CM

## 2023-01-20 DIAGNOSIS — I4891 Unspecified atrial fibrillation: Secondary | ICD-10-CM | POA: Diagnosis not present

## 2023-01-20 DIAGNOSIS — I5023 Acute on chronic systolic (congestive) heart failure: Secondary | ICD-10-CM | POA: Diagnosis not present

## 2023-01-20 DIAGNOSIS — F17210 Nicotine dependence, cigarettes, uncomplicated: Secondary | ICD-10-CM

## 2023-01-20 LAB — BASIC METABOLIC PANEL
Anion gap: 7 (ref 5–15)
BUN: 21 mg/dL (ref 8–23)
CO2: 28 mmol/L (ref 22–32)
Calcium: 8.7 mg/dL — ABNORMAL LOW (ref 8.9–10.3)
Chloride: 99 mmol/L (ref 98–111)
Creatinine, Ser: 0.98 mg/dL (ref 0.44–1.00)
GFR, Estimated: >60 mL/min (ref >60)
Glucose, Bld: 101 mg/dL — ABNORMAL HIGH (ref 70–99)
Potassium: 4.7 mmol/L (ref 3.5–5.1)
Sodium: 134 mmol/L — ABNORMAL LOW (ref 135–145)

## 2023-01-20 LAB — MAGNESIUM: Magnesium: 2.1 mg/dL (ref 1.7–2.4)

## 2023-01-20 MED ORDER — DAPAGLIFLOZIN PROPANEDIOL 10 MG PO TABS
10.0000 mg | ORAL_TABLET | Freq: Every day | ORAL | Status: DC
  Administered 2023-01-20 – 2023-01-21 (×2): 10 mg via ORAL
  Filled 2023-01-20 (×2): qty 1

## 2023-01-20 MED ORDER — METOPROLOL SUCCINATE ER 100 MG PO TB24
150.0000 mg | ORAL_TABLET | Freq: Every day | ORAL | Status: DC
  Administered 2023-01-21: 150 mg via ORAL
  Filled 2023-01-20: qty 2

## 2023-01-20 MED ORDER — FUROSEMIDE 40 MG PO TABS: 40.0000 mg | ORAL_TABLET | Freq: Two times a day (BID) | ORAL | Status: DC

## 2023-01-20 MED ORDER — FUROSEMIDE 10 MG/ML IJ SOLN
40.0000 mg | Freq: Two times a day (BID) | INTRAMUSCULAR | Status: DC
  Administered 2023-01-20: 40 mg via INTRAVENOUS
  Filled 2023-01-20 (×2): qty 4

## 2023-01-20 NOTE — Evaluation (Signed)
Physical Therapy Evaluation Patient Details Name: Autumn [name] MRN: [id number] DOB: [birth year] Today's Date: 01/20/2023  History of Present Illness  67 y.o. female presents to [facility] hospital on 01/18/2023 with DOE. Pt found to be in Afib with RVR, also with acute on chronic heart failure. PMH significant for Parkinson's disease, CHF, COPD, HLD, fibromyalgia, OA, ADD, anxiety, depression, hx of polysubstance abuse on suboxone.  Clinical Impression  Pt presents to PT at or near her baseline, ambulating with support of [facility] for household distances. Pt reports mild DOE, although sats remain stable in low 90s when on 2L [place]. Pt reports needing assistance with obtaining supplemental oxygen to her residence, as she has not had access to it since moving in with family. PT encourages frequent mobilization, no post-acute PT services recommended.       Recommendations for follow up therapy are one component of a multi-disciplinary discharge planning process, led by the attending physician.  Recommendations may be updated based on patient status, additional functional criteria and insurance authorization.  Follow Up Recommendations       Assistance Recommended at Discharge PRN  Patient can return home with the following  Assistance with cooking/housework;Assist for transportation    Equipment Recommendations None recommended by PT  Recommendations for Other Services       Functional Status Assessment Patient has had a recent decline in their functional status and demonstrates the ability to make significant improvements in function in a reasonable and predictable amount of time.     Precautions / Restrictions Precautions Precautions: Other (comment) Precaution Comments: monitor HR and sats Restrictions Weight Bearing Restrictions: No      Mobility  Bed Mobility Overal bed mobility: Modified Independent                  Transfers Overall transfer level: Modified independent                       Ambulation/Gait Ambulation/Gait assistance: Modified independent (Device/Increase time) Gait Distance (Feet): 200 Feet Assistive device: Straight cane (PRN railing) Gait Pattern/deviations: Step-through pattern, Wide base of support Gait velocity: functional Gait velocity interpretation: 1.31 - 2.62 ft/sec, indicative of limited community ambulator   General Gait Details: functional gait speed, initially pushing O2 tank and utilizing SPC. PT then pushes tank, pt utilizing railing PRN, seems to be more for endurance. PT encourages standing rest breaks with fatigue however pt declines.  Stairs            Wheelchair Mobility    Modified Rankin (Stroke Patients Only)       Balance Overall balance assessment: Needs assistance Sitting-balance support: No upper extremity supported, Feet supported Sitting balance-Leahy Scale: Good     Standing balance support: No upper extremity supported, During functional activity Standing balance-Leahy Scale: Fair                               Pertinent Vitals/Pain Pain Assessment Pain Assessment: No/denies pain    Home Living Family/patient expects to be discharged to:: Private residence Living Arrangements: Children;Other relatives Available Help at Discharge: Family;Available PRN/intermittently Type of Home: House Home Access: Stairs to enter Entrance Stairs-Rails: Can reach both Entrance Stairs-Number of Steps: 5 Alternate Level Stairs-Number of Steps: 6 Home Layout: Two level Home Equipment: Rollator (4 wheels);Cane - single point      Prior Function Prior Level of Function : Independent/Modified Independent  Mobility Comments: ambulatory with SPC typically, PRN rollator       Hand Dominance   Dominant Hand: Right    Extremity/Trunk Assessment   Upper Extremity Assessment Upper Extremity Assessment: Overall WFL for tasks assessed    Lower Extremity Assessment Lower  Extremity Assessment: Overall WFL for tasks assessed    Cervical / Trunk Assessment Cervical / Trunk Assessment: Normal  Communication   Communication: No difficulties  Cognition Arousal/Alertness: Awake/alert Behavior During Therapy: WFL for tasks assessed/performed Overall Cognitive Status: Within Functional Limits for tasks assessed                                          General Comments General comments (skin integrity, edema, etc.): VSS on 2L [place] at rest, sats in low 90s. When mobilizing pt desats to 86% on room air, requiring 2L [place] to consistently maintain sats in low 90s. HR in 90-100s at rest, elevated into low 130s near completion of ambulation.    Exercises     Assessment/Plan    PT Assessment Patient needs continued PT services  PT Problem List Decreased activity tolerance;Decreased mobility;Cardiopulmonary status limiting activity       PT Treatment Interventions Gait training;Stair training;Functional mobility training;Balance training;Neuromuscular re-education;Patient/family education    PT Goals (Current goals can be found in the Care Plan section)  Acute Rehab PT Goals Patient Stated Goal: to go home PT Goal Formulation: With patient Time For Goal Achievement: 02/03/23 Potential to Achieve Goals: Good Additional Goals Additional Goal #1: Pt will report 1/4 DOE or less when ambulating for >200' to demonstrate improved activity tolerance    Frequency Min 2X/week     Co-evaluation               AM-PAC PT "6 Clicks" Mobility  Outcome Measure Help needed turning from your back to your side while in a flat bed without using bedrails?: None Help needed moving from lying on your back to sitting on the side of a flat bed without using bedrails?: None Help needed moving to and from a bed to a chair (including a wheelchair)?: None Help needed standing up from a chair using your arms (e.g., wheelchair or bedside chair)?: None Help needed  to walk in hospital room?: None Help needed climbing 3-5 steps with a railing? : A Little 6 Click Score: 23    End of Session Equipment Utilized During Treatment: Oxygen Activity Tolerance: Patient tolerated treatment well Patient left: in bed;with call bell/phone within reach Nurse Communication: Mobility status PT Visit Diagnosis: Other abnormalities of gait and mobility (R26.89)    Time: 1610-9604 PT Time Calculation (min) (ACUTE ONLY): 15 min   Charges:   PT Evaluation $PT Eval Low Complexity: 1 Low          Arlyss Gandy, PT, DPT Acute Rehabilitation Office [phone]   Arlyss Gandy 01/20/2023, 8:49 AM

## 2023-01-20 NOTE — TOC Initial Note (Signed)
Transition of Care [facility]) - Initial/Assessment Note    Patient Details  Name: DANINE BOUGHNER MRN: [id number] Date of Birth: 1956-05-27  [facility]) CM/SW Contact:    Harriet Masson, RN Phone Number: 01/20/2023, 3:16 PM  Clinical Narrative:                 Spoke to patient regarding transition needs.  Patient lives with grandson and did have home 02 from [employer] but returned it. Notified [employer] of new order for home 02.  Patient uses Medicaid transport to get to apts. Patient declines need for SA resources.  Address, Phone number and PCP verified.  Expected Discharge Plan: Home/Self Care Barriers to Discharge: Continued Medical Work up   Patient Goals and CMS Choice Patient states their goals for this hospitalization and ongoing recovery are:: return home          Expected Discharge Plan and Services       Living arrangements for the past 2 months: Single Family Home                 DME Arranged: Oxygen DME Agency: [employer] Date DME Agency Contacted: 01/20/23 Time DME Agency Contacted: [phone] Representative spoke with at DME Agency: Mitch            Prior Living Arrangements/Services Living arrangements for the past 2 months: Single Family Home Lives with:: Other (Comment) (grandson) Patient language and need for interpreter reviewed:: Yes Do you feel safe going back to the place where you live?: Yes      Need for Family Participation in Patient Care: Yes (Comment) Care giver support system in place?: Yes (comment)   Criminal Activity/Legal Involvement Pertinent to Current Situation/Hospitalization: No - Comment as needed  Activities of Daily Living      Permission Sought/Granted                  Emotional Assessment   Attitude/Demeanor/Rapport: Gracious Affect (typically observed): Accepting Orientation: : Oriented to Self, Oriented to Place, Oriented to  Time, Oriented to Situation Alcohol / Substance Use: Not Applicable Psych  Involvement: No (comment)  Admission diagnosis:  SOB (shortness of breath) [R06.02] Atrial fibrillation with rapid ventricular response (HCC) [I48.91] Atrial fibrillation with RVR (HCC) [I48.91] Acute on chronic congestive heart failure, unspecified heart failure type [facility]) [I50.9] Patient Active Problem List   Diagnosis Date Noted   Acute on chronic systolic heart failure (HCC) 01/18/2023   HLD (hyperlipidemia) 12/06/2022   COPD (chronic obstructive pulmonary disease) (HCC) 12/03/2022   Congestive heart failure (CHF) (HCC) 12/03/2022   Parkinson's disease 12/03/2022   A-fib (HCC) 12/03/2022   Elevated brain natriuretic peptide (BNP) level 12/03/2022   Elevated random blood glucose level 12/03/2022   Shortness of breath 12/03/2022   PAD (peripheral artery disease) (HCC) 12/03/2022   CAD (coronary artery disease) S/P stent to LAD 12/03/2022   Atrial fibrillation with rapid ventricular response (HCC) 01/11/2021   Tobacco dependence 01/11/2021   Homelessness 01/11/2021   Alcohol abuse 06/27/2017   Alcohol dependence with uncomplicated withdrawal (HCC) 06/27/2017   Polysubstance abuse (HCC) 06/27/2017   Cocaine abuse (HCC) 06/27/2017   Depression 06/27/2017   Essential hypertension 06/27/2017   GERD (gastroesophageal reflux disease) 06/27/2017   Chest pain 06/26/2017   Attention deficit disorder (ADD)    Drug abuse (HCC)    SINUSITIS, ACUTE 10/11/2007   ONYCHOMYCOSIS, TOENAILS 06/28/2007   HEPATITIS C 05/26/2007   SUBSTANCE ABUSE, MULTIPLE 05/26/2007   CIRRHOSIS 05/26/2007   LOW  BACK PAIN SYNDROME 05/26/2007   TROCHANTERIC BURSITIS, LEFT 05/26/2007   INSOMNIA 05/13/2007   HOARSENESS, CHRONIC 05/13/2007   CARPAL TUNNEL SYNDROME, BILATERAL 12/02/2006   DIVERTICULOSIS, COLON 06/01/2006   RECTAL BLEEDING, HX OF 04/10/2006   PCP:  Vivien Presto, MD Pharmacy:   [facility] [address] [place] [address] Phone: [phone] Fax:  [phone]  [facility] [address] [place] [address] Phone: [phone] Fax: [phone]     Social Determinants of Health (SDOH) Social History: SDOH Screenings   Tobacco Use: High Risk (12/03/2022)   SDOH Interventions:     Readmission Risk Interventions    12/07/2022    1:27 PM  Readmission Risk Prevention Plan  Transportation Screening Complete  PCP or Specialist Appt within 3-5 Days Complete  HRI or Home Care Consult Complete  Social Work Consult for Recovery Care Planning/Counseling Complete  Palliative Care Screening Not Applicable  Medication Review [employer]) Complete

## 2023-01-20 NOTE — Progress Notes (Signed)
SATURATION QUALIFICATIONS: (This note is used to comply with regulatory documentation for home oxygen)  Patient Saturations on Room Air at Rest = 92%  Patient Saturations on Room Air while Ambulating = 86%  Patient Saturations on 2 Liters of oxygen while Ambulating = 92%  Please briefly explain why patient needs home oxygen: Patient desats below 88% while walking on room air

## 2023-01-20 NOTE — Evaluation (Signed)
Occupational Therapy Evaluation Patient Details Name: Autumn [name] MRN: [id number] DOB: 03/26/1956 Today's Date: 01/20/2023   History of Present Illness 67 y.o. female presents to [facility] hospital on 01/18/2023 with DOE. Pt found to be in Afib with RVR, also with acute on chronic heart failure. PMH significant for Parkinson's disease, CHF, COPD, HLD, fibromyalgia, OA, ADD, anxiety, depression, hx of polysubstance abuse on suboxone.   Clinical Impression   PTA, pt recent living with grandson and was independent. Upon eval, pt with decreased activity tolerance, memory, and reporting parkinson's diagnosis is new to her; she is unsure what to expect. Pt grossly supervision approaching mod I. Pt with potentially poor health literacy, reporting she was trying to stop taking her medications, but that this was not in collaboration with her doctor or medical team. Will continue to follow.      Recommendations for follow up therapy are one component of a multi-disciplinary discharge planning process, led by the attending physician.  Recommendations may be updated based on patient status, additional functional criteria and insurance authorization.   Assistance Recommended at Discharge Set up Supervision/Assistance  Patient can return home with the following Help with stairs or ramp for entrance;Assist for transportation;Assistance with cooking/housework    Functional Status Assessment  Patient has had a recent decline in their functional status and demonstrates the ability to make significant improvements in function in a reasonable and predictable amount of time.  Equipment Recommendations  None recommended by OT    Recommendations for Other Services       Precautions / Restrictions Precautions Precautions: Other (comment) Precaution Comments: monitor HR and sats Restrictions Weight Bearing Restrictions: No      Mobility Bed Mobility Overal bed mobility: Modified Independent                   Transfers Overall transfer level: Modified independent                        Balance Overall balance assessment: Needs assistance Sitting-balance support: No upper extremity supported, Feet supported Sitting balance-Leahy Scale: Good     Standing balance support: No upper extremity supported, During functional activity Standing balance-Leahy Scale: Fair                             ADL either performed or assessed with clinical judgement   ADL Overall ADL's : Modified independent                                       General ADL Comments: Pt grossly mod I for ADL. Decreased balance and safety overall. Pt with parkinsons disease diagnosis and would benefit from continued education.     Vision Ability to See in Adequate Light: 0 Adequate Patient Visual Report: No change from baseline Vision Assessment?: No apparent visual deficits     Perception Perception Perception Tested?: No   Praxis Praxis Praxis tested?: Not tested    Pertinent Vitals/Pain Pain Assessment Pain Assessment: No/denies pain     Hand Dominance Right   Extremity/Trunk Assessment Upper Extremity Assessment Upper Extremity Assessment: Generalized weakness   Lower Extremity Assessment Lower Extremity Assessment: Defer to PT evaluation   Cervical / Trunk Assessment Cervical / Trunk Assessment: Normal   Communication Communication Communication: No difficulties   Cognition Arousal/Alertness: Awake/alert Behavior During Therapy: WFL for tasks  assessed/performed Overall Cognitive Status: No family/caregiver present to determine baseline cognitive functioning Area of Impairment: Memory, Safety/judgement, Awareness, Problem solving                     Memory: Decreased short-term memory   Safety/Judgement: Decreased awareness of safety Awareness: Emergent Problem Solving: Slow processing General Comments: decr delayed memroy recall and some  decreased safety awareness.     General Comments  VSS 2L [place]    Exercises Exercises: Other exercises Other Exercises Other Exercises: Pt reporting recently diagnosed with Parkinson's disease; Providinng education on disease progression and providing handouts to be reviewed in a later session due to pt lathargic and sleeping when OT returned.   Shoulder Instructions      Home Living Family/patient expects to be discharged to:: Private residence Living Arrangements: Children;Other relatives Available Help at Discharge: Family;Available PRN/intermittently Type of Home: House Home Access: Stairs to enter [employer] of Steps: 5 Entrance Stairs-Rails: Can reach both Home Layout: Two level Alternate Level Stairs-Number of Steps: 6 Alternate Level Stairs-Rails: Can reach both           Home Equipment: Rollator (4 wheels);Cane - single point   Additional Comments: staying at grand sons house      Prior Functioning/Environment Prior Level of Function : Independent/Modified Independent             Mobility Comments: ambulatory with SPC typically, PRN rollator          OT Problem List: Decreased strength;Decreased activity tolerance;Impaired balance (sitting and/or standing);Decreased knowledge of use of DME or AE;Decreased cognition      OT Treatment/Interventions: Self-care/ADL training;Therapeutic exercise;DME and/or AE instruction;Balance training;Patient/family education;Therapeutic activities    OT Goals(Current goals can be found in the care plan section) Acute Rehab OT Goals Patient Stated Goal: get home OT Goal Formulation: With patient Time For Goal Achievement: 02/03/23 Potential to Achieve Goals: Good  OT Frequency: Min 2X/week    Co-evaluation              AM-PAC OT "6 Clicks" Daily Activity     Outcome Measure Help from another person eating meals?: None Help from another person taking care of personal grooming?: A Little Help from  another person toileting, which includes using toliet, bedpan, or urinal?: A Little Help from another person bathing (including washing, rinsing, drying)?: A Little Help from another person to put on and taking off regular upper body clothing?: A Little Help from another person to put on and taking off regular lower body clothing?: A Little 6 Click Score: 19   End of Session Equipment Utilized During Treatment: Oxygen Nurse Communication: Mobility status  Activity Tolerance: Patient tolerated treatment well Patient left: in bed;with call bell/phone within reach  OT Visit Diagnosis: Unsteadiness on feet (R26.81);Muscle weakness (generalized) (M62.81);Other symptoms and signs involving cognitive function                Time: 1005-1023 OT Time Calculation (min): 18 min Charges:  OT General Charges $OT Visit: 1 Visit OT Evaluation $OT Eval Low Complexity: 1 Low  Tyler Deis, OTR/L [facility] Acute Rehabilitation Office: [phone]   Myrla Halsted 01/20/2023, 3:33 PM

## 2023-01-20 NOTE — Progress Notes (Signed)
Rounding Note    Patient Name: Autumn [name] Date of Encounter: 01/20/2023  [facility] Cardiologist: Thomasene Ripple, DO   Subjective   I/Os not recorded. BP 96/82.  Cr stable at 0.98.  Reports dyspnea improving  Inpatient Medications    Scheduled Meds:  ARIPiprazole  10 mg Oral Daily   atorvastatin  80 mg Oral Daily   buprenorphine-naloxone  1 tablet Sublingual TID   carbidopa-levodopa  1 tablet Oral TID   DULoxetine  60 mg Oral Daily   furosemide  40 mg Oral BID   gabapentin  600 mg Oral TID   metoprolol tartrate  50 mg Oral TID   mometasone-formoterol  2 puff Inhalation BID   nicotine  14 mg Transdermal Daily   rivaroxaban  20 mg Oral Daily   senna-docusate  1 tablet Oral QHS   umeclidinium bromide  1 puff Inhalation Daily   Continuous Infusions:  PRN Meds: albuterol, ondansetron (ZOFRAN) IV, polyethylene glycol   Vital Signs    Vitals:   01/19/23 2300 01/20/23 0100 01/20/23 0300 01/20/23 0332  BP: 99/83   96/82  Pulse: 100   88  Resp: 16   14  Temp: 98.5 F (36.9 C)   98 F (36.7 C)  TempSrc: Oral   Oral  SpO2: 96%   98%  Weight:  104 kg 104 kg   Height:        Intake/Output Summary (Last 24 hours) at 01/20/2023 0855 Last data filed at 01/19/2023 1500 Gross per 24 hour  Intake 93.96 ml  Output --  Net 93.96 ml       01/20/2023    3:00 AM 01/20/2023    1:00 AM 01/18/2023    8:45 AM  Last 3 Weights  Weight (lbs) 229 lb 4.8 oz 229 lb 4.8 oz 219 lb  Weight (kg) 104.01 kg 104.01 kg 99.338 kg      Telemetry    Afib rates 90-110s - Personally Reviewed  ECG    No new ECG - Personally Reviewed  Physical Exam   GEN: No acute distress.   Neck:+ JVD Cardiac: irregular, tachycardic, no murmurs Respiratory: CTAB GI: Soft, nontender, non-distended  MS: trace edema;  Neuro:  Nonfocal  Psych: Normal affect   Labs    High Sensitivity Troponin:   Recent Labs  Lab 01/18/23 0935 01/18/23 1108  TROPONINIHS 26* 31*       Chemistry Recent Labs  Lab 01/18/23 0935 01/18/23 0945 01/19/23 0240 01/19/23 2354  NA 136 140 134* 134*  K 3.5 3.5 4.0 4.7  CL 103  --  99 99  CO2 20*  --  25 28  GLUCOSE 131*  --  154* 101*  BUN 8  --  12 21  CREATININE 0.85  --  0.89 0.98  CALCIUM 8.7*  --  8.7* 8.7*  MG  --   --  2.0 2.1  PROT 6.2*  --   --   --   ALBUMIN 2.7*  --   --   --   AST 39  --   --   --   ALT 26  --   --   --   ALKPHOS 73  --   --   --   BILITOT 0.5  --   --   --   GFRNONAA >60  --  >60 >60  ANIONGAP 13  --  10 7     Lipids No results for input(s): "CHOL", "TRIG", "HDL", "LABVLDL", "LDLCALC", "  CHOLHDL" in the last 168 hours.  Hematology Recent Labs  Lab 01/18/23 0935 01/18/23 0945 01/19/23 0240  WBC 13.9*  --  9.9  RBC 4.04  --  3.85*  HGB 10.9* 11.9* 10.6*  HCT 35.7* 35.0* 33.8*  MCV 88.4  --  87.8  MCH 27.0  --  27.5  MCHC 30.5  --  31.4  RDW 17.1*  --  17.1*  PLT 284  --  252    Thyroid No results for input(s): "TSH", "FREET4" in the last 168 hours.  BNP Recent Labs  Lab 01/18/23 0902  BNP 293.8*     DDimer No results for input(s): "DDIMER" in the last 168 hours.   Radiology    ECHOCARDIOGRAM LIMITED  Result Date: 01/19/2023    ECHOCARDIOGRAM LIMITED REPORT   Patient Name:   Autumn [name] Date of Exam: 01/19/2023 Medical Rec #:  [id number]       Height:       64.0 in Accession #:    [id number]      Weight:       219.0 lb Date of Birth:  06-22-1956       BSA:          2.033 m Patient Age:    66 years        BP:           99/68 mmHg Patient Gender: F               HR:           99 bpm. Exam Location:  Inpatient Procedure: Limited Echo, Limited Color Doppler and Cardiac Doppler Indications:    CHF-Acute Systolic I50.21  History:        Patient has prior history of Echocardiogram examinations, most                 recent 12/03/2022. COPD, Arrythmias:Atrial Fibrillation; Risk                 Factors:Dyslipidemia.  Sonographer:    Eulah Pont RDCS Referring Phys: [id number]  Taheera Thomann L Marce Charlesworth IMPRESSIONS  1. Left ventricular ejection fraction, by estimation, is 35 to 40%. The left ventricle has moderately decreased function. The left ventricle demonstrates global hypokinesis. The left ventricular internal cavity size was mildly dilated. Left ventricular diastolic function could not be evaluated.  2. Right ventricular systolic function is moderately reduced. The right ventricular size is mildly enlarged. There is mildly elevated pulmonary artery systolic pressure. The estimated right ventricular systolic pressure is 43.3 mmHg.  3. The mitral valve is degenerative. Moderate to severe mitral valve regurgitation. No evidence of mitral stenosis.  4. The aortic valve is tricuspid. Aortic valve regurgitation is mild. Aortic valve sclerosis is present, with no evidence of aortic valve stenosis.  5. The inferior vena cava is dilated in size with <50% respiratory variability, suggesting right atrial pressure of 15 mmHg. Comparison(s): No significant change from prior study. EF remains reduced and appears 35-40% on this study. FINDINGS  Left Ventricle: Left ventricular ejection fraction, by estimation, is 35 to 40%. The left ventricle has moderately decreased function. The left ventricle demonstrates global hypokinesis. The left ventricular internal cavity size was mildly dilated. There is no left ventricular hypertrophy. Left ventricular diastolic function could not be evaluated. Left ventricular diastolic function could not be evaluated due to atrial fibrillation. Right Ventricle: The right ventricular size is mildly enlarged. No increase in right ventricular wall thickness. Right ventricular systolic function is moderately reduced.  There is mildly elevated pulmonary artery systolic pressure. The tricuspid regurgitant velocity is 2.66 m/s, and with an assumed right atrial pressure of 15 mmHg, the estimated right ventricular systolic pressure is 43.3 mmHg. Pericardium: There is no evidence  of pericardial effusion. Mitral Valve: The mitral valve is degenerative in appearance. Mild mitral annular calcification. Moderate to severe mitral valve regurgitation. No evidence of mitral valve stenosis. Tricuspid Valve: The tricuspid valve is grossly normal. Tricuspid valve regurgitation is mild . No evidence of tricuspid stenosis. Aortic Valve: The aortic valve is tricuspid. Aortic valve regurgitation is mild. Aortic regurgitation PHT measures 334 msec. Aortic valve sclerosis is present, with no evidence of aortic valve stenosis. Aorta: The aortic root and ascending aorta are structurally normal, with no evidence of dilitation. Venous: The inferior vena cava is dilated in size with less than 50% respiratory variability, suggesting right atrial pressure of 15 mmHg. Additional Comments: Spectral Doppler performed. Color Doppler performed.  LEFT VENTRICLE PLAX 2D LVIDd:         5.60 cm LVIDs:         4.60 cm LV PW:         1.00 cm LV IVS:        1.00 cm LVOT diam:     2.10 cm LV SV:         51 LV SV Index:   25 LVOT Area:     3.46 cm  LV Volumes (MOD) LV vol d, MOD A2C: 180.0 ml LV vol d, MOD A4C: 152.0 ml LV vol s, MOD A2C: 107.0 ml LV vol s, MOD A4C: 88.4 ml LV SV MOD A2C:     73.0 ml LV SV MOD A4C:     152.0 ml LV SV MOD BP:      67.4 ml RIGHT VENTRICLE TAPSE (M-mode): 1.3 cm LEFT ATRIUM         Index LA diam:    5.60 cm 2.75 cm/m  AORTIC VALVE LVOT Vmax:   99.30 cm/s LVOT Vmean:  68.667 cm/s LVOT VTI:    0.147 m AI PHT:      334 msec  AORTA Ao Root diam: 3.20 cm Ao Asc diam:  3.70 cm MR Peak grad:    75.7 mmHg    TRICUSPID VALVE MR Mean grad:    54.0 mmHg    TR Peak grad:   28.3 mmHg MR Vmax:         435.00 cm/s  TR Vmax:        266.00 cm/s MR Vmean:        355.0 cm/s MR PISA:         0.57 cm     SHUNTS MR PISA Eff ROA: 5 mm        Systemic VTI:  0.15 m MR PISA Radius:  0.30 cm      Systemic Diam: 2.10 cm Lennie Odor MD Electronically signed by Lennie Odor MD Signature Date/Time: 01/19/2023/10:40:39 AM     Final    DG Chest Portable 1 View  Result Date: 01/18/2023 CLINICAL DATA:  Shortness of breath. EXAM: PORTABLE CHEST 1 VIEW COMPARISON:  Chest x-ray dated December 04, 2022. FINDINGS: The patient is rotated to the right. Unchanged mild cardiomegaly. Increased interstitial thickening at both lung bases with layering small bilateral pleural effusions and mild bibasilar atelectasis. No pneumothorax. No acute osseous abnormality. IMPRESSION: 1. Mild congestive heart failure. Electronically Signed   By: Obie Dredge M.D.   On: 01/18/2023 10:25    Cardiac  Studies     Patient Profile     68 y.o. female  with a hx of CAD s/p atherectomy and cutting balloon with stenting to mLAD 2020, chronic systolic heart failure, PAF, moderate MR, PAD, COPD, Parkinson's disorder, substance use, chronic respiratory failure, and hypertension who is being seen for the evaluation of A-fib RVR   Assessment & Plan    Atrial fibrillation with RVR: likely permanent afib. Takes 100 mg toprol at home without good rate control, per the patient.  Started on dilt gtt on admission.   - likely permanent afib, rate control strategy recommended - Switch eliquis to xarelto for better compliance, states she only takes medications once daily - continue lopressor 50 mg TID, will consolidate to toprol XL on discharge    Chronic anticoagulation On eliquis 5 mg BID without bleeding issues, but reports taking this only once daily or 10 mg once daily, unclear from patient interview.  - would transition to xarelto 20 mg daily      CAD with prior PCI/DES - LAD No ASA given eliquis - no chest pain - continue BB and statin     Acute on Chronic systolic heart failure Echo Jan and April 2024 with LVEF 40-45%.  Echo this admission with EF 35-40% - GDMT PTA: toprol, losartan, farxiga - continue IV lasix 40 mg BID - holding losartan due to soft BP, can titrate GDMT as able - Will add back farxiga   Mitral regurgitation Moderate to  severe, appears functional.  Will monitor with treatment of heart failure   Hypertension - 0.1 mg clonidine BID listed on her home medications, but likely not a good choice for her given noncompliance and reports of taking all medications once daily - Soft BP, will hold losartan for now.  Continue metoprolol for rate control   Acute on Chronic respiratory failure Likely multifactorial given being out of O2 for 2 months, CHF, COPD, and Afib RVR - continue diuresis as above   COPD On 2 L [place]     Parkinson's disease Polysubstance abuse OSA Current smoker - does not want to quit Per primary    For questions or updates, please contact [facility] Please consult [email or web address] for contact info under        Signed, Little Ishikawa, MD  01/20/2023, 8:55 AM

## 2023-01-20 NOTE — Progress Notes (Addendum)
   Heart Failure Stewardship Pharmacist Progress Note   PCP: Corrington, Kip A, MD PCP-Cardiologist: Thomasene Ripple, DO    HPI:  67 yo F with PMH of CHF, CAD, HTN, afib, Parkinson's, and polysubstance use (smoked meth within the last week).   Admitted in 08/2022 with shortness of breath, found to be in afib RVR. LVEF 40-45%. At follow up, she remained in afib and underwent cardioversion. Ongoing drug use suspected to have contributed to afib RVR.   Admitted 11/2022 with shortness of breath and palpitations, back in afib RVR. Noncompliance with medications and continued substance use. LVEF 40-45%.  Presented to the ED on 6/10 with shortness of breath and LE edema. In afib RVR again. CXR with mild CHF. ECHO 6/11 showed LVEF 35-40%, global hypokinesis, RV moderately reduced, moderate to severe MR.   Current HF Medications: Diuretic: furosemide 40 mg IV BID Beta Blocker: metoprolol tartrate 50 mg TID SGLT2i: Farxiga 10 mg daily  Prior to admission HF Medications: Diuretic: furosemide 40 mg daily Beta blocker: metoprolol XL 100 mg daily ACE/ARB/ARNI: lisinopril 2.5 mg daily  Pertinent Lab Values: Serum creatinine 0.98, BUN 21, Potassium 4.7, Sodium 134, BNP 293.8, Magnesium 2.1  Vital Signs: Weight: 229 lbs (admission weight: 229 lbs) Blood pressure: 90-100/80s  Heart rate: 100-110s - afib  I/O: incomplete  Medication Assistance / Insurance Benefits Check: Does the patient have prescription insurance?  Yes Type of insurance plan: [place] Medicaid  Outpatient Pharmacy:  Prior to admission outpatient pharmacy: [facility] Is the patient willing to use [facility] TOC pharmacy at discharge? Yes Is the patient willing to transition their outpatient pharmacy to utilize a [facility] outpatient pharmacy?   Yes - transitioned to [facility] mail order    Assessment: 1. Acute on chronic systolic CHF (LVEF 35-40%). NYHA class III symptoms. - Continue furosemide 40 mg IV BID. Strict I/Os and daily weights. Keep  K>4 and Mg>2. - Continue metoprolol tartrate 50 mg TID, will need to consolidate to metoprolol XL prior to discharge - Holding ACE/ARB/ARNI and MRA with soft BP. May not be a good candidate for Entresto as she only takes medications once per day. - Agree with restarting Farxiga 10 mg daily - Would not resume clonidine at discharge   Plan: 1) Medication changes recommended at this time: - Agree with changes  2) Patient assistance: - Farxiga copay $0  3)  Education  - Patient has been educated on current HF medications and potential additions to HF medication regimen - Patient verbalizes understanding that over the next few months, these medication doses may change and more medications may be added to optimize HF regimen - Patient has been educated on basic disease state pathophysiology and goals of therapy   Sharen Hones, PharmD, BCPS Heart Failure Stewardship Pharmacist Phone [phone]

## 2023-01-20 NOTE — Progress Notes (Signed)
Patient refused CPAP for the night  

## 2023-01-20 NOTE — Progress Notes (Signed)
SATURATION QUALIFICATIONS: (This note is used to comply with regulatory documentation for home oxygen)  Patient Saturations on Room Air at Rest = 92%  Patient Saturations on Room Air while Ambulating = 86%  Patient Saturations on 2 Liters of oxygen while Ambulating = 92%  Please briefly explain why patient needs home oxygen: Patient desats below 88% while walking on room air    SATURATION QUALIFICATIONS: (This note is used to comply with regulatory documentation for home oxygen)   Patient Saturations on Room Air at Rest = 92%   Patient Saturations on Room Air while Ambulating = 86%   Patient Saturations on 2 Liters of oxygen while Ambulating = 92%   Please briefly explain why patient needs home oxygen: Patient desats below 88% while walking on room air

## 2023-01-20 NOTE — Progress Notes (Addendum)
Subjective:  Overnight events: Patient was hypotensive to 80/56. Losartan continues to be held.  Patient feeling well this morning. She would like to go home to see her grandchildren. She was able to walk with PT without difficulty. Her breathing and lower extremity edema continue to improve. She denies chest pain, palpitations, shortness of breath, lightheadedness, and dizziness. Discussed plan to continue diuresis, heart rate control, and blood pressure monitoring. All questions and concerns were addressed.   Objective:  Vital signs in last 24 hours: Vitals:   01/19/23 2300 01/20/23 0100 01/20/23 0300 01/20/23 0332  BP: 99/83   96/82  Pulse: 100   88  Resp: 16   14  Temp: 98.5 F (36.9 C)   98 F (36.7 C)  TempSrc: Oral   Oral  SpO2: 96%   98%  Weight:  104 kg 104 kg   Height:       Physical Exam: GENERAL: Chronically ill-appearing female sitting comfortably on edge of bed. Interactive and in good spirits. No acute distress. CV: Tachycardic rate, irregularly irregular rhythm. No murmurs appreciated. Warm extremities.  PULM: Normal work of breathing on 1L [place]. Occasional expiratory wheezes bilaterally. GI: Abdomen soft, non-tender, non-distended. Normoactive bowel sounds.  MSK: Normal bulk, tone. Trace pitting edema bilaterally.  SKIN: Warm, dry. No rashes or lesions noted.  NEURO: Awake, alert, conversing appropriately. Moving all four extremities spontaneously.  PSYCH: Normal mood, affect.    Assessment/Plan:  Principal Problem:   Atrial fibrillation with rapid ventricular response (HCC) Active Problems:   Acute on chronic systolic heart failure (HCC)   Autumn [name] is 67 year-old female with HFmrEF (40-45%), paroxysmal atrial fibrillation on Eliquis, coronary artery disease s/p stent 2020, chronic respiratory failure on 2L supplemental O2 2/2 COPD/OSA, polysubstance use disorder, well-controlled type II diabetes mellitus, and Parkinson's disease who was admitted on  6/10 for atrial fibrillation with rapid ventricular response and acute on chronic heart failure.    #Atrial fibrillation with RVR Patient denies experiencing any chest pain or palpitations. She remains in atrial fibrillation with rates around 100-110s. Will continue rate control with metoprolol tartrate and anticoagulation with Xarelto. Plan to transition to metoprolol succinate at discharge. - Appreciate cardiology recommendations - Xarelto 20 mg daily - Metoprolol tartrate 50 mg TID   #Acute on chronic systolic heart failure exacerbation Patient is now saturating well on 1L [place], down from her baseline of 2L [place] at home. Lower extremity edema has improved on exam. Repeat echocardiogram showed a reduced ejection fraction (35 to 40%) from prior echo (40 to 45%) with global hypokinesis and moderate to severe MR (previously only mild MR). Will continue IV Lasix 40 mg BID for now. Anticipate transition to PO lasix in coming days. Will resume Farxiga 10 mg daily. Will continue holding losartan due to low-normal blood pressure. - Appreciate cardiology recommendations - Resume Farxiga 10 mg daily - Hold losartan 25 mg daily - IV Lasix 40 mg BID  - Strict I&Os, daily weights   #Acute on chronic hypoxic respiratory failure Patient reports breathing feels back to baseline. Occasional expiratory wheezing was appreciated on exam today. Suspect initial dyspnea was of cardiac origin in the setting of atrial fibrillation and HFrEF. Will continue home medications while hospitalized. Will repeat ambulatory pulse ox for home oxygen planning. - Dulera 2 puffs daily - Incruse Ellipta 1 puff daily - f/u ambulatory pulse ox   #Polysubstance use disorder Patient continues to use methamphetamines and marijuana. She also takes suboxone three times daily for opioid  use disorder at home. Will continue suboxone and nicotine patch while hospitalized.  - Suboxone 8-2mg  TID - Nicotine patch - TOC consult   #Type II  diabetes mellitus Blood glucose well-controlled. Will continue SSI while hospitalized. - SSI  #Parkinson's disease - Continue home carbidopa-levodopa    #CAD s/p stenting - Continue home statin   #Major depressive disorder - Continue home Abilify   #Chronic pain syndrome - Continue home duloxetine, gabapentin   #Chronic hepatitis C infection Unclear if this has been treated or not. Noted in the chart dating back to at least 2008. No clinical signs of cirrhosis or synthetic dysfunction. - Follow-up outpatient   Whitman Hero, Medical Student 01/20/2023, 11:51 AM On call pager: [phone]

## 2023-01-21 ENCOUNTER — Other Ambulatory Visit (HOSPITAL_COMMUNITY): Payer: Self-pay

## 2023-01-21 ENCOUNTER — Encounter (HOSPITAL_COMMUNITY): Payer: Self-pay | Admitting: Internal Medicine

## 2023-01-21 DIAGNOSIS — I4891 Unspecified atrial fibrillation: Secondary | ICD-10-CM | POA: Diagnosis not present

## 2023-01-21 DIAGNOSIS — I5023 Acute on chronic systolic (congestive) heart failure: Secondary | ICD-10-CM | POA: Diagnosis not present

## 2023-01-21 LAB — BASIC METABOLIC PANEL
Anion gap: 8 (ref 5–15)
BUN: 24 mg/dL — ABNORMAL HIGH (ref 8–23)
CO2: 32 mmol/L (ref 22–32)
Calcium: 8.6 mg/dL — ABNORMAL LOW (ref 8.9–10.3)
Chloride: 97 mmol/L — ABNORMAL LOW (ref 98–111)
Creatinine, Ser: 1 mg/dL (ref 0.44–1.00)
GFR, Estimated: >60 mL/min (ref >60)
Glucose, Bld: 91 mg/dL (ref 70–99)
Potassium: 4.1 mmol/L (ref 3.5–5.1)
Sodium: 137 mmol/L (ref 135–145)

## 2023-01-21 LAB — MAGNESIUM: Magnesium: 2.2 mg/dL (ref 1.7–2.4)

## 2023-01-21 MED ORDER — RIVAROXABAN 20 MG PO TABS
20.0000 mg | ORAL_TABLET | Freq: Every day | ORAL | 0 refills | Status: DC
  Filled 2023-01-21: qty 30, 30d supply, fill #0

## 2023-01-21 MED ORDER — CARBIDOPA-LEVODOPA 25-100 MG PO TABS: 1.0000 | ORAL_TABLET | Freq: Three times a day (TID) | ORAL | Status: DC

## 2023-01-21 MED ORDER — METOPROLOL SUCCINATE ER 50 MG PO TB24
150.0000 mg | ORAL_TABLET | Freq: Every day | ORAL | 0 refills | Status: DC
  Filled 2023-01-21: qty 90, 30d supply, fill #0

## 2023-01-21 MED ORDER — FUROSEMIDE 40 MG PO TABS
40.0000 mg | ORAL_TABLET | Freq: Every day | ORAL | Status: DC
  Administered 2023-01-21: 40 mg via ORAL
  Filled 2023-01-21: qty 1

## 2023-01-21 NOTE — Plan of Care (Signed)
  Problem: Education: Goal: Knowledge of General Education information will improve Description: Including pain rating scale, medication(s)/side effects and non-pharmacologic comfort measures 01/21/2023 1321 by Marlou Sa, RN Outcome: Adequate for Discharge 01/21/2023 1152 by Marlou Sa, RN Outcome: Progressing   Problem: Health Behavior/Discharge Planning: Goal: Ability to manage health-related needs will improve 01/21/2023 1321 by Marlou Sa, RN Outcome: Adequate for Discharge 01/21/2023 1152 by Marlou Sa, RN Outcome: Progressing   Problem: Clinical Measurements: Goal: Ability to maintain clinical measurements within normal limits will improve 01/21/2023 1321 by Marlou Sa, RN Outcome: Adequate for Discharge 01/21/2023 1152 by Marlou Sa, RN Outcome: Progressing Goal: Will remain free from infection 01/21/2023 1321 by Marlou Sa, RN Outcome: Adequate for Discharge 01/21/2023 1152 by Marlou Sa, RN Outcome: Progressing Goal: Diagnostic test results will improve 01/21/2023 1321 by Marlou Sa, RN Outcome: Adequate for Discharge 01/21/2023 1152 by Marlou Sa, RN Outcome: Progressing Goal: Respiratory complications will improve 01/21/2023 1321 by Marlou Sa, RN Outcome: Adequate for Discharge 01/21/2023 1152 by Marlou Sa, RN Outcome: Progressing Goal: Cardiovascular complication will be avoided 01/21/2023 1321 by Marlou Sa, RN Outcome: Adequate for Discharge 01/21/2023 1152 by Marlou Sa, RN Outcome: Progressing   Problem: Activity: Goal: Risk for activity intolerance will decrease 01/21/2023 1321 by Marlou Sa, RN Outcome: Adequate for Discharge 01/21/2023 1152 by Marlou Sa, RN Outcome: Progressing   Problem: Nutrition: Goal: Adequate nutrition will be maintained 01/21/2023 1321 by Marlou Sa,  RN Outcome: Adequate for Discharge 01/21/2023 1152 by Marlou Sa, RN Outcome: Progressing   Problem: Coping: Goal: Level of anxiety will decrease 01/21/2023 1321 by Marlou Sa, RN Outcome: Adequate for Discharge 01/21/2023 1152 by Marlou Sa, RN Outcome: Progressing   Problem: Elimination: Goal: Will not experience complications related to bowel motility 01/21/2023 1321 by Marlou Sa, RN Outcome: Adequate for Discharge 01/21/2023 1152 by Marlou Sa, RN Outcome: Progressing Goal: Will not experience complications related to urinary retention 01/21/2023 1321 by Marlou Sa, RN Outcome: Adequate for Discharge 01/21/2023 1152 by Marlou Sa, RN Outcome: Progressing   Problem: Pain Managment: Goal: General experience of comfort will improve 01/21/2023 1321 by Marlou Sa, RN Outcome: Adequate for Discharge 01/21/2023 1152 by Marlou Sa, RN Outcome: Progressing   Problem: Safety: Goal: Ability to remain free from injury will improve 01/21/2023 1321 by Marlou Sa, RN Outcome: Adequate for Discharge 01/21/2023 1152 by Marlou Sa, RN Outcome: Progressing   Problem: Skin Integrity: Goal: Risk for impaired skin integrity will decrease 01/21/2023 1321 by Marlou Sa, RN Outcome: Adequate for Discharge 01/21/2023 1152 by Marlou Sa, RN Outcome: Progressing   Problem: Acute Rehab PT Goals(only PT should resolve) Goal: Pt Will Ambulate Outcome: Adequate for Discharge Goal: Pt Will Go Up/Down Stairs Outcome: Adequate for Discharge Goal: PT Additional Goal #1 Outcome: Adequate for Discharge   Problem: Acute Rehab PT Goals(only PT should resolve) Goal: Pt Will Ambulate Outcome: Adequate for Discharge Goal: Pt Will Go Up/Down Stairs Outcome: Adequate for Discharge Goal: PT Additional Goal #1 Outcome: Adequate for Discharge

## 2023-01-21 NOTE — Progress Notes (Signed)
   Heart Failure Stewardship Pharmacist Progress Note   PCP: Corrington, Kip A, MD PCP-Cardiologist: Thomasene Ripple, DO    HPI:  67 yo F with PMH of CHF, CAD, HTN, afib, Parkinson's, and polysubstance use (smoked meth within the last week).   Admitted in 08/2022 with shortness of breath, found to be in afib RVR. LVEF 40-45%. At follow up, she remained in afib and underwent cardioversion. Ongoing drug use suspected to have contributed to afib RVR.   Admitted 11/2022 with shortness of breath and palpitations, back in afib RVR. Noncompliance with medications and continued substance use. LVEF 40-45%.  Presented to the ED on 6/10 with shortness of breath and LE edema. In afib RVR again. CXR with mild CHF. ECHO 6/11 showed LVEF 35-40%, global hypokinesis, RV moderately reduced, moderate to severe MR.   Current HF Medications: Diuretic: furosemide 40 mg PO daily Beta Blocker: metoprolol XL 150 mg daily SGLT2i: Farxiga 10 mg daily  Prior to admission HF Medications: Diuretic: furosemide 40 mg daily Beta blocker: metoprolol XL 100 mg daily ACE/ARB/ARNI: lisinopril 2.5 mg daily  Pertinent Lab Values: Serum creatinine 1.00, BUN 24, Potassium 4.1, Sodium 137, BNP 293.8, Magnesium 2.2  Vital Signs: Weight: 220 lbs (admission weight: 229 lbs) Blood pressure: 100/70s  Heart rate: 80s - afib  I/O: incomplete  Medication Assistance / Insurance Benefits Check: Does the patient have prescription insurance?  Yes Type of insurance plan: [place] [employer]  Outpatient Pharmacy:  Prior to admission outpatient pharmacy: [facility] Is the patient willing to use [facility] TOC pharmacy at discharge? Yes Is the patient willing to transition their outpatient pharmacy to utilize a [facility] outpatient pharmacy?   Yes - transitioned to [facility] mail order    Assessment: 1. Acute on chronic systolic CHF (LVEF 35-40%). NYHA class III symptoms. - Agree with transitioning to furosemide 40 mg PO daily. Strict I/Os and daily  weights. Keep K>4 and Mg>2. - Agree with transitioning to metoprolol XL 150 mg daily - Holding ACE/ARB/ARNI and MRA with soft BP. May not be a good candidate for Entresto as she only takes medications once per day. - Continue Farxiga 10 mg daily - Would not resume clonidine at discharge   Plan: 1) Medication changes recommended at this time: - Agree with changes  2) Patient assistance: - Farxiga copay $0  3)  Education  - Patient has been educated on current HF medications and potential additions to HF medication regimen - Patient verbalizes understanding that over the next few months, these medication doses may change and more medications may be added to optimize HF regimen - Patient has been educated on basic disease state pathophysiology and goals of therapy   Sharen Hones, PharmD, BCPS Heart Failure Stewardship Pharmacist Phone [phone]

## 2023-01-21 NOTE — Progress Notes (Signed)
Dr. Bjorn Pippin reached out to request post-hospital f/u to be arranged in about 4 weeks since pt has HF TOC follow-up arranged. Appointment outlined on AVS, 7/11 in [place] location.

## 2023-01-21 NOTE — Progress Notes (Addendum)
Rounding Note    Patient Name: TAMILA HOLSEN Date of Encounter: 01/21/2023  [facility] Cardiologist: Thomasene Ripple, DO   Subjective   I/Os not recorded. Weight recorded as down 9 lbs from yesterday. BP 98/70.  Cr stable at 1.0.  Reports dyspnea resolved  Inpatient Medications    Scheduled Meds:  ARIPiprazole  10 mg Oral Daily   atorvastatin  80 mg Oral Daily   buprenorphine-naloxone  1 tablet Sublingual TID   carbidopa-levodopa  1 tablet Oral TID   dapagliflozin propanediol  10 mg Oral Daily   DULoxetine  60 mg Oral Daily   furosemide  40 mg Intravenous BID   gabapentin  600 mg Oral TID   metoprolol succinate  150 mg Oral Daily   mometasone-formoterol  2 puff Inhalation BID   nicotine  14 mg Transdermal Daily   rivaroxaban  20 mg Oral Daily   senna-docusate  1 tablet Oral QHS   umeclidinium bromide  1 puff Inhalation Daily   Continuous Infusions:  PRN Meds: albuterol, ondansetron (ZOFRAN) IV, polyethylene glycol   Vital Signs    Vitals:   01/21/23 0500 01/21/23 0721 01/21/23 0745 01/21/23 0746  BP:  98/70    Pulse: 83 (!) 116    Resp: 14 13    Temp:  97.8 F (36.6 C)    TempSrc:  Oral    SpO2: 91% 91% 92% (!) 82%  Weight: 99.8 kg     Height:       No intake or output data in the 24 hours ending 01/21/23 0905     01/21/2023    5:00 AM 01/20/2023    3:00 AM 01/20/2023    1:00 AM  Last 3 Weights  Weight (lbs) 220 lb 0.3 oz 229 lb 4.8 oz 229 lb 4.8 oz  Weight (kg) 99.8 kg 104.01 kg 104.01 kg      Telemetry    Afib rates 80-90s - Personally Reviewed  ECG    No new ECG - Personally Reviewed  Physical Exam   GEN: No acute distress.   Neck:No JVD Cardiac: irregular, normal rate no murmurs Respiratory: CTAB GI: Soft, nontender, non-distended  MS: no edema;  Neuro:  Nonfocal  Psych: Normal affect   Labs    High Sensitivity Troponin:   Recent Labs  Lab 01/18/23 0935 01/18/23 1108  TROPONINIHS 26* 31*      Chemistry Recent  Labs  Lab 01/18/23 0935 01/18/23 0945 01/19/23 0240 01/19/23 2354 01/20/23 2339  NA 136   < > 134* 134* 137  K 3.5   < > 4.0 4.7 4.1  CL 103  --  99 99 97*  CO2 20*  --  25 28 32  GLUCOSE 131*  --  154* 101* 91  BUN 8  --  12 21 24*  CREATININE 0.85  --  0.89 0.98 1.00  CALCIUM 8.7*  --  8.7* 8.7* 8.6*  MG  --   --  2.0 2.1 2.2  PROT 6.2*  --   --   --   --   ALBUMIN 2.7*  --   --   --   --   AST 39  --   --   --   --   ALT 26  --   --   --   --   ALKPHOS 73  --   --   --   --   BILITOT 0.5  --   --   --   --  GFRNONAA >60  --  >60 >60 >60  ANIONGAP 13  --  10 7 8    < > = values in this interval not displayed.     Lipids No results for input(s): "CHOL", "TRIG", "HDL", "LABVLDL", "LDLCALC", "CHOLHDL" in the last 168 hours.  Hematology Recent Labs  Lab 01/18/23 0935 01/18/23 0945 01/19/23 0240  WBC 13.9*  --  9.9  RBC 4.04  --  3.85*  HGB 10.9* 11.9* 10.6*  HCT 35.7* 35.0* 33.8*  MCV 88.4  --  87.8  MCH 27.0  --  27.5  MCHC 30.5  --  31.4  RDW 17.1*  --  17.1*  PLT 284  --  252    Thyroid No results for input(s): "TSH", "FREET4" in the last 168 hours.  BNP Recent Labs  Lab 01/18/23 0902  BNP 293.8*     DDimer No results for input(s): "DDIMER" in the last 168 hours.   Radiology    ECHOCARDIOGRAM LIMITED  Result Date: 01/19/2023    ECHOCARDIOGRAM LIMITED REPORT   Patient Name:   RAVENNA LEGORE Schumm Date of Exam: 01/19/2023 Medical Rec #:  [id number]       Height:       64.0 in Accession #:    [id number]      Weight:       219.0 lb Date of Birth:  Nov 14, 1955       BSA:          2.033 m Patient Age:    66 years        BP:           99/68 mmHg Patient Gender: F               HR:           99 bpm. Exam Location:  Inpatient Procedure: Limited Echo, Limited Color Doppler and Cardiac Doppler Indications:    CHF-Acute Systolic I50.21  History:        Patient has prior history of Echocardiogram examinations, most                 recent 12/03/2022. COPD, Arrythmias:Atrial  Fibrillation; Risk                 Factors:Dyslipidemia.  Sonographer:    Eulah Pont RDCS Referring Phys: [id number] Stormey Wilborn L Kadrian Partch IMPRESSIONS  1. Left ventricular ejection fraction, by estimation, is 35 to 40%. The left ventricle has moderately decreased function. The left ventricle demonstrates global hypokinesis. The left ventricular internal cavity size was mildly dilated. Left ventricular diastolic function could not be evaluated.  2. Right ventricular systolic function is moderately reduced. The right ventricular size is mildly enlarged. There is mildly elevated pulmonary artery systolic pressure. The estimated right ventricular systolic pressure is 43.3 mmHg.  3. The mitral valve is degenerative. Moderate to severe mitral valve regurgitation. No evidence of mitral stenosis.  4. The aortic valve is tricuspid. Aortic valve regurgitation is mild. Aortic valve sclerosis is present, with no evidence of aortic valve stenosis.  5. The inferior vena cava is dilated in size with <50% respiratory variability, suggesting right atrial pressure of 15 mmHg. Comparison(s): No significant change from prior study. EF remains reduced and appears 35-40% on this study. FINDINGS  Left Ventricle: Left ventricular ejection fraction, by estimation, is 35 to 40%. The left ventricle has moderately decreased function. The left ventricle demonstrates global hypokinesis. The left ventricular internal cavity size was mildly dilated. There is no left ventricular hypertrophy. Left  ventricular diastolic function could not be evaluated. Left ventricular diastolic function could not be evaluated due to atrial fibrillation. Right Ventricle: The right ventricular size is mildly enlarged. No increase in right ventricular wall thickness. Right ventricular systolic function is moderately reduced. There is mildly elevated pulmonary artery systolic pressure. The tricuspid regurgitant velocity is 2.66 m/s, and with an assumed right atrial  pressure of 15 mmHg, the estimated right ventricular systolic pressure is 43.3 mmHg. Pericardium: There is no evidence of pericardial effusion. Mitral Valve: The mitral valve is degenerative in appearance. Mild mitral annular calcification. Moderate to severe mitral valve regurgitation. No evidence of mitral valve stenosis. Tricuspid Valve: The tricuspid valve is grossly normal. Tricuspid valve regurgitation is mild . No evidence of tricuspid stenosis. Aortic Valve: The aortic valve is tricuspid. Aortic valve regurgitation is mild. Aortic regurgitation PHT measures 334 msec. Aortic valve sclerosis is present, with no evidence of aortic valve stenosis. Aorta: The aortic root and ascending aorta are structurally normal, with no evidence of dilitation. Venous: The inferior vena cava is dilated in size with less than 50% respiratory variability, suggesting right atrial pressure of 15 mmHg. Additional Comments: Spectral Doppler performed. Color Doppler performed.  LEFT VENTRICLE PLAX 2D LVIDd:         5.60 cm LVIDs:         4.60 cm LV PW:         1.00 cm LV IVS:        1.00 cm LVOT diam:     2.10 cm LV SV:         51 LV SV Index:   25 LVOT Area:     3.46 cm  LV Volumes (MOD) LV vol d, MOD A2C: 180.0 ml LV vol d, MOD A4C: 152.0 ml LV vol s, MOD A2C: 107.0 ml LV vol s, MOD A4C: 88.4 ml LV SV MOD A2C:     73.0 ml LV SV MOD A4C:     152.0 ml LV SV MOD BP:      67.4 ml RIGHT VENTRICLE TAPSE (M-mode): 1.3 cm LEFT ATRIUM         Index LA diam:    5.60 cm 2.75 cm/m  AORTIC VALVE LVOT Vmax:   99.30 cm/s LVOT Vmean:  68.667 cm/s LVOT VTI:    0.147 m AI PHT:      334 msec  AORTA Ao Root diam: 3.20 cm Ao Asc diam:  3.70 cm MR Peak grad:    75.7 mmHg    TRICUSPID VALVE MR Mean grad:    54.0 mmHg    TR Peak grad:   28.3 mmHg MR Vmax:         435.00 cm/s  TR Vmax:        266.00 cm/s MR Vmean:        355.0 cm/s MR PISA:         0.57 cm     SHUNTS MR PISA Eff ROA: 5 mm        Systemic VTI:  0.15 m MR PISA Radius:  0.30 cm       Systemic Diam: 2.10 cm Lennie Odor MD Electronically signed by Lennie Odor MD Signature Date/Time: 01/19/2023/10:40:39 AM    Final     Cardiac Studies     Patient Profile     67 y.o. female  with a hx of CAD s/p atherectomy and cutting balloon with stenting to mLAD 2020, chronic systolic heart failure, PAF, moderate MR, PAD, COPD, Parkinson's disorder, substance  use, chronic respiratory failure, and hypertension who is being seen for the evaluation of A-fib RVR   Assessment & Plan    Atrial fibrillation with RVR: likely permanent afib. Takes 100 mg toprol at home without good rate control, per the patient.  Started on dilt gtt on admission.   - likely permanent afib, rate control strategy recommended - Switch eliquis to xarelto for better compliance, states she only takes medications once daily - continue metoprolol, will consolidate to toprol XL 150 mg daily.  Chronic anticoagulation On eliquis 5 mg BID without bleeding issues, but reports taking this only once daily or 10 mg once daily, unclear from patient interview.  - transitioned to xarelto 20 mg daily as above   CAD with prior PCI/DES - LAD No ASA given eliquis - no chest pain - continue BB and statin   Acute on Chronic systolic heart failure Echo Jan and April 2024 with LVEF 40-45%.  Echo this admission with EF 35-40% - GDMT PTA: toprol, losartan, farxiga - holding losartan due to soft BP, can titrate GDMT as able - Continue farxiga - appears euvolemic, will transition to p.o. Lasix 40 mg daily   Mitral regurgitation Moderate to severe, appears functional.  Will monitor with treatment of heart failure  Hypertension - 0.1 mg clonidine BID listed on her home medications, but likely not a good choice for her given noncompliance and reports of taking all medications once daily - Soft BP, will hold losartan for now.  Continue metoprolol for rate control   Acute on Chronic respiratory failure Likely multifactorial given  being out of O2 for 2 months, CHF, COPD, and Afib RVR - IMproved   COPD On 2 L [place]     Parkinson's disease Polysubstance abuse OSA Current smoker - does not want to quit Per primary    [facility] will sign off.   Medication Recommendations: Xarelto 20 mg daily, Toprol-XL 150 mg daily, Farxiga 10 mg daily, Lasix 40 mg daily Other recommendations (labs, testing, etc): BMET in 1 week Follow up as an outpatient: Will schedule   For questions or updates, please contact [facility] Please consult [email or web address] for contact info under        Signed, Little Ishikawa, MD  01/21/2023, 9:05 AM

## 2023-01-21 NOTE — TOC Transition Note (Signed)
Transition of Care [facility]) - CM/SW Discharge Note   Patient Details  Name: LAUNA GOEDKEN MRN: [id number] Date of Birth: 12/20/1955  [facility] CM/SW Contact:  Harriet Masson, RN Phone Number: 01/21/2023, 1:29 PM   Clinical Narrative:    Patient stable for discharge.  OP rehab referral sent to [facility] for OT. Patient is aware and information added to AVS. Granddaughter in law will transport home.     Final next level of care: OP Rehab Barriers to Discharge: Barriers Resolved   Patient Goals and CMS Choice      Discharge Placement                         Discharge Plan and Services Additional resources added to the After Visit Summary for                  DME Arranged: Oxygen DME Agency: [employer] Date DME Agency Contacted: 01/20/23 Time DME Agency Contacted: [phone] Representative spoke with at DME Agency: Marthann Schiller            Social Determinants of Health (SDOH) Interventions SDOH Screenings   Food Insecurity: No Food Insecurity (01/21/2023)  Housing: Low Risk  (01/21/2023)  Transportation Needs: No Transportation Needs (01/21/2023)  Alcohol Screen: Low Risk  (01/21/2023)  Financial Resource Strain: Low Risk  (01/21/2023)  Tobacco Use: High Risk (01/21/2023)     Readmission Risk Interventions    01/21/2023    1:29 PM 12/07/2022    1:27 PM  Readmission Risk Prevention Plan  Transportation Screening Complete Complete  PCP or Specialist Appt within 3-5 Days  Complete  HRI or Home Care Consult  Complete  Social Work Consult for Recovery Care Planning/Counseling  Complete  Palliative Care Screening  Not Applicable  Medication Review Oceanographer) Complete Complete  PCP or Specialist appointment within 3-5 days of discharge Complete   HRI or Home Care Consult Complete   SW Recovery Care/Counseling Consult Complete   Palliative Care Screening Not Applicable   Skilled Nursing Facility Not Applicable

## 2023-01-21 NOTE — Progress Notes (Signed)
Patient provided with verbal discharge instructions. Paper copy of discharge provided to patient. RN answered all questions. VSS at discharge. IV removed. Patient belongings sent with patient. Pt home O2 delivered to room.  Patient dc'd via wheelchair to private vehicle. RN went over d/c with patient and granddaughter at car.

## 2023-01-21 NOTE — Plan of Care (Signed)

## 2023-01-21 NOTE — Discharge Summary (Signed)
Name: Autumn [name] MRN: [id number] DOB: 1955-09-09 67 y.o. PCP: Vivien Presto, MD  Date of Admission: 01/18/2023  8:23 AM Date of Discharge: 01/21/23 Attending Physician: Dr. Heide Spark  Discharge Diagnosis: Principal Problem:   Atrial fibrillation with rapid ventricular response [facility]) Active Problems:   Acute on chronic systolic heart failure [facility])    Discharge Medications: Allergies as of 01/21/2023       Reactions   Pamelor [nortriptyline Hcl] Anaphylaxis   Vibra-tab [doxycycline] Other (See Comments)   Unknown reaction        Medication List     STOP taking these medications    cloNIDine 0.1 MG tablet Commonly known as: CATAPRES   cyclobenzaprine 10 MG tablet Commonly known as: FLEXERIL   diltiazem 120 MG 24 hr capsule Commonly known as: CARDIZEM CD   Eliquis 5 MG Tabs tablet Generic drug: apixaban   lisinopril 2.5 MG tablet Commonly known as: ZESTRIL   losartan 25 MG tablet Commonly known as: COZAAR   prochlorperazine 5 MG tablet Commonly known as: COMPAZINE       TAKE these medications    albuterol 0.63 MG/3ML nebulizer solution Commonly known as: ACCUNEB Take 1 ampule by nebulization every 6 (six) hours as needed for wheezing or shortness of breath.   Albuterol Sulfate (sensor) 108 (90 Base) MCG/ACT Aepb Inhale 2 puffs into the lungs every 6 (six) hours as needed (wheezing, shortness of breath).   ARIPiprazole 10 MG tablet Commonly known as: ABILIFY Take 1 tablet (10 mg total) by mouth daily.   atorvastatin 80 MG tablet Commonly known as: LIPITOR Take 1 tablet (80 mg total) by mouth daily.   Breztri Aerosphere 160-9-4.8 MCG/ACT Aero Generic drug: Budeson-Glycopyrrol-Formoterol Inhale 2 puffs into the lungs 2 (two) times daily.   busPIRone 10 MG tablet Commonly known as: BUSPAR Take 10 mg by mouth 2 (two) times daily.   carbidopa-levodopa 25-100 MG tablet Commonly known as: SINEMET IR Take 1 tablet by mouth 3 (three) times  daily. What changed: when to take this   dapagliflozin propanediol 5 MG Tabs tablet Commonly known as: Farxiga Take 2 tablets (10 mg total) by mouth daily.   diclofenac Sodium 1 % Gel Commonly known as: VOLTAREN Apply 2-4 g topically 4 (four) times daily as needed (pain).   DULoxetine 60 MG capsule Commonly known as: CYMBALTA Take 1 capsule (60 mg total) by mouth daily.   fluticasone 50 MCG/ACT nasal spray Commonly known as: FLONASE Place 2 sprays into both nostrils daily as needed for allergies.   furosemide 40 MG tablet Commonly known as: LASIX Take 40 mg by mouth daily.   gabapentin 600 MG tablet Commonly known as: NEURONTIN Take 600 mg by mouth 3 (three) times daily.   guaiFENesin-dextromethorphan 100-10 MG/5ML syrup Commonly known as: ROBITUSSIN DM Take 5 mLs by mouth every 6 (six) hours as needed for cough.   melatonin 3 MG Tabs tablet Take 1 tablet (3 mg total) by mouth at bedtime as needed. What changed: reasons to take this   metoprolol succinate 50 MG 24 hr tablet Commonly known as: TOPROL-XL Take 3 tablets (150 mg total) by mouth daily. Take with or immediately following a meal. What changed:  medication strength how much to take when to take this   multivitamin with minerals Tabs tablet Take 1 tablet by mouth every morning.   naloxone 4 MG/0.1ML Liqd nasal spray kit Commonly known as: NARCAN Place 4 mg into the nose as needed (overdose, as needed).   pantoprazole  40 MG tablet Commonly known as: Protonix Take 1 tablet (40 mg total) by mouth daily.   Suboxone 8-2 MG Film Generic drug: Buprenorphine HCl-Naloxone HCl Place 1 Film under the tongue 3 (three) times daily. What changed: Another medication with the same name was removed. Continue taking this medication, and follow the directions you see here.   Xarelto 20 MG Tabs tablet Generic drug: rivaroxaban Take 1 tablet (20 mg total) by mouth daily. Start taking on: January 22, 2023                Durable Medical Equipment  (From admission, onward)           Start     Ordered   01/20/23 1400  For home use only DME oxygen  Once       Question Answer Comment  Length of Need Lifetime   Mode or (Route) Nasal cannula   Liters per Minute 2   Frequency Continuous (stationary and portable oxygen unit needed)   Oxygen delivery system Gas      01/20/23 1359            Disposition and follow-up:   Ms.Ayla P Roethlisberger was discharged from [facility] in Good condition.  At the hospital follow up visit please address:  1.  Follow-up:  a.  A-fib on AC: Assess HR and symptoms, ensure adherence to metoprolol and Xarelto daily, ensure no concerns of bleeding    b.  HFrEF: Assess volume status, ensure adherence to [place] and Lasix, BP low normal during hospital so losartan was not restarted can consider in the outpatient setting, cardiology following outpatient   c.  Chronic hypoxic respiratory failure: DME for home O2 replaced and supplies given prior to discharge   d. Chronic hepatitis C: Continue outpatient follow-up  2.  Labs / imaging needed at time of follow-up: BMP  3.  Pending labs/ test needing follow-up: none  4.  Medication Changes  -Xarelto 20 mg daily -Metoprolol 150 mg daily -STOP Eliquis, losartan, lisinopril, clonidine, diltiazem  Follow-up Appointments:  Follow-up Information     [facility] Follow up.   Why: Home [facility] Contact information: [address] [place] [address] [phone]         [facility]. Go in 14 day(s).   Specialty: Cardiology Why: Hospital follow up 02/05/2023 @ 11 am at [facility] PLEASE bring a current medication list to appointment FREE valet parking, Entrance C, off [facility] [employer] information: [address] [id number] mc [place] [address] [phone]        Ronney Asters, NP Follow up.   Specialty: Cardiology Why: [facility] - [facility] location - general cardiology follow-up arranged on Thursday February 18, 2023 10:55 AM (Arrive by 10:40 AM). Verdon Cummins is one of our cardiology nurse practitioners. Contact information: [address] [place] [address] [phone]         Corrington, Kip A, MD. Schedule an appointment as soon as possible for a visit in 5 day(s).   Specialty: Family Medicine Why: Hospital follow up Contact information: [address] [place] [address] [phone]         [facility] Follow up.   Specialty: Rehabilitation Why: Call to schedule apt for occutational thearpy Contact information: [address] 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    #Atrial fibrillation with RVR Patient presented with a 1-day history of dyspnea and LE edema found to be in A-fib with RVR. She was previously prescribed 2L [place] home oxygen but has been without home oxygen for 2 months as she lost her apartment and was briefly homeless.  She was started on diltiazem drip but transition to metoprolol.  Rate was well-controlled on metoprolol 150 mg.  Transition from Eliquis to Xarelto for better medication adherence after discharge.  Patient to continue metoprolol succinate 150 mg daily and Xarelto 20 mg daily.   #Acute on chronic systolic heart failure Presented with dyspnea and LE  edema. Repeat echo showed EF decreased to 35-40 percent from 40-45 percent in April 2024 with global hypokinesis and moderate to severe MR (previously only mild MR). Started on IV Lasix during hospital course then transition back to her home p.o. Lasix 40 mg daily. Weight at discharge was 99.8 kg. Home Farxiga 10 mg was restarted but losartan was held due to low normal BP. Can reassess in outpatient setting regarding restarting losartan.  #Acute on chronic respiratory failure Suspect initial dyspnea was of cardiac origin in the setting of atrial fibrillation and HFrEF. Had been without her home O2 for 2 months. Noted some diffuse wheezing during hospital course which resolved by day of discharge. Home inhalers were continued throughout hospitalization. DME for home O2 placed and supplies given prior to discharge. Will continue at discharge.   #Polysubstance use disorder Patient continues to use methamphetamines and marijuana. She also takes suboxone 8-2 mg TID for opioid use disorder at home. Patient received nicotine patch and home suboxone throughout hospitalization. Will continue suboxone at discharge.    #Type II diabetes mellitus Last A1c 6.1% in March. Blood glucose well-controlled throughout hospitalization.   #Parkinson's disease Patient follows with outpatient neurology. Symptoms stable. Home carbidopa-levodopa was continued throughout hospitalization. Will continue at discharge.   #CAD s/p stenting Patient denied chest pain on admission. Troponin flat at 26>31. ECG without changes from prior. Home statin was continued throughout hospitalization. Will continue at discharge.   #Major depressive disorder Home Abilify was continued throughout hospitalization. Will continue at discharge.   #Chronic pain syndrome Home duloxetine and gabapentin was continued throughout hospitalization. Will continue at discharge.   #Chronic hepatitis C infection Unclear if this has been treated or not.  Noted in the chart dating back to at least 2008. No clinical signs of cirrhosis or synthetic dysfunction. Recommend outpatient follow-up.    Discharge Subjective: Patient states that she is feeling better given improvement of respiratory status. Notes that she was walking the halls yesterday and did well with that. Notes decreased leg swelling. Discussed plan for discharge today and medication changes and adherence. Answered all her questions.   Discharge Exam:   BP (!) 135/112 (BP Location: Right Arm)   Pulse 79   Temp 97.8 F (36.6 C) (Oral)   Resp 14   Ht 5\' 4"  (1.626 m)   Wt 99.8 kg   SpO2 94%   BMI 37.77 kg/m  Constitutional: Chronically ill-appearing female lying in bed comfortably, in no acute distress Cardiovascular: Irregularly irregular  rhythm, regular rate Pulmonary/Chest: normal work of breathing on 2L Door, lungs clear to auscultation bilaterally Abdominal: Bowel sounds present, soft, non-tender, non-distended MSK: Normal bulk and tone, trace bilateral pitting edema Neurological: Awake and alert Skin: Warm and dry Psych: Pleasant mood  Pertinent Labs, Studies, and Procedures:     Latest Ref Rng & Units 01/19/2023    2:40 AM 01/18/2023    9:45 AM 01/18/2023    9:35 AM  CBC  WBC 4.0 - 10.5 K/uL 9.9   13.9   Hemoglobin 12.0 - 15.0 g/dL 59.5  63.8  75.6   Hematocrit 36.0 - 46.0 % 33.8  35.0  35.7   Platelets 150 - 400 K/uL 252   284        Latest Ref Rng & Units 01/20/2023   11:39 PM 01/19/2023   11:54 PM 01/19/2023    2:40 AM  CMP  Glucose 70 - 99 mg/dL 91  433  295   BUN 8 - 23 mg/dL 24  21  12    Creatinine 0.44 - 1.00 mg/dL 1.88  4.16  6.06   Sodium 135 - 145 mmol/L 137  134  134   Potassium 3.5 - 5.1 mmol/L 4.1  4.7  4.0   Chloride 98 - 111 mmol/L 97  99  99   CO2 22 - 32 mmol/L 32  28  25   Calcium 8.9 - 10.3 mg/dL 8.6  8.7  8.7     ECHOCARDIOGRAM LIMITED  Result Date: 01/19/2023    ECHOCARDIOGRAM LIMITED REPORT   Patient Name:   NATIVIDAD HIEMSTRA Fetting Date of  Exam: 01/19/2023 Medical Rec #:  [id number]       Height:       64.0 in Accession #:    [id number]      Weight:       219.0 lb Date of Birth:  1955-11-24       BSA:          2.033 m Patient Age:    66 years        BP:           99/68 mmHg Patient Gender: F               HR:           99 bpm. Exam Location:  Inpatient Procedure: Limited Echo, Limited Color Doppler and Cardiac Doppler Indications:    CHF-Acute Systolic I50.21  History:        Patient has prior history of Echocardiogram examinations, most                 recent 12/03/2022. COPD, Arrythmias:Atrial Fibrillation; Risk                 Factors:Dyslipidemia.  Sonographer:    Eulah Pont RDCS Referring Phys: [id number] CHRISTOPHER L SCHUMANN IMPRESSIONS  1. Left ventricular ejection fraction, by estimation, is 35 to 40%. The left ventricle has moderately decreased function. The left ventricle demonstrates global hypokinesis. The left ventricular internal cavity size was mildly dilated. Left ventricular diastolic function could not be evaluated.  2. Right ventricular systolic function is moderately reduced. The right ventricular size is mildly enlarged. There is mildly elevated pulmonary artery systolic pressure. The estimated right ventricular systolic pressure is 43.3 mmHg.  3. The mitral valve is degenerative. Moderate to severe mitral valve regurgitation. No evidence of mitral stenosis.  4. The aortic valve is tricuspid. Aortic valve regurgitation is mild. Aortic valve sclerosis is present, with  no evidence of aortic valve stenosis.  5. The inferior vena cava is dilated in size with <50% respiratory variability, suggesting right atrial pressure of 15 mmHg. Comparison(s): No significant change from prior study. EF remains reduced and appears 35-40% on this study. FINDINGS  Left Ventricle: Left ventricular ejection fraction, by estimation, is 35 to 40%. The left ventricle has moderately decreased function. The left ventricle demonstrates global hypokinesis. The  left ventricular internal cavity size was mildly dilated. There is no left ventricular hypertrophy. Left ventricular diastolic function could not be evaluated. Left ventricular diastolic function could not be evaluated due to atrial fibrillation. Right Ventricle: The right ventricular size is mildly enlarged. No increase in right ventricular wall thickness. Right ventricular systolic function is moderately reduced. There is mildly elevated pulmonary artery systolic pressure. The tricuspid regurgitant velocity is 2.66 m/s, and with an assumed right atrial pressure of 15 mmHg, the estimated right ventricular systolic pressure is 43.3 mmHg. Pericardium: There is no evidence of pericardial effusion. Mitral Valve: The mitral valve is degenerative in appearance. Mild mitral annular calcification. Moderate to severe mitral valve regurgitation. No evidence of mitral valve stenosis. Tricuspid Valve: The tricuspid valve is grossly normal. Tricuspid valve regurgitation is mild . No evidence of tricuspid stenosis. Aortic Valve: The aortic valve is tricuspid. Aortic valve regurgitation is mild. Aortic regurgitation PHT measures 334 msec. Aortic valve sclerosis is present, with no evidence of aortic valve stenosis. Aorta: The aortic root and ascending aorta are structurally normal, with no evidence of dilitation. Venous: The inferior vena cava is dilated in size with less than 50% respiratory variability, suggesting right atrial pressure of 15 mmHg. Additional Comments: Spectral Doppler performed. Color Doppler performed.  LEFT VENTRICLE PLAX 2D LVIDd:         5.60 cm LVIDs:         4.60 cm LV PW:         1.00 cm LV IVS:        1.00 cm LVOT diam:     2.10 cm LV SV:         51 LV SV Index:   25 LVOT Area:     3.46 cm  LV Volumes (MOD) LV vol d, MOD A2C: 180.0 ml LV vol d, MOD A4C: 152.0 ml LV vol s, MOD A2C: 107.0 ml LV vol s, MOD A4C: 88.4 ml LV SV MOD A2C:     73.0 ml LV SV MOD A4C:     152.0 ml LV SV MOD BP:      67.4 ml RIGHT  VENTRICLE TAPSE (M-mode): 1.3 cm LEFT ATRIUM         Index LA diam:    5.60 cm 2.75 cm/m  AORTIC VALVE LVOT Vmax:   99.30 cm/s LVOT Vmean:  68.667 cm/s LVOT VTI:    0.147 m AI PHT:      334 msec  AORTA Ao Root diam: 3.20 cm Ao Asc diam:  3.70 cm MR Peak grad:    75.7 mmHg    TRICUSPID VALVE MR Mean grad:    54.0 mmHg    TR Peak grad:   28.3 mmHg MR Vmax:         435.00 cm/s  TR Vmax:        266.00 cm/s MR Vmean:        355.0 cm/s MR PISA:         0.57 cm     SHUNTS MR PISA Eff ROA: 5 mm  Systemic VTI:  0.15 m MR PISA Radius:  0.30 cm      Systemic Diam: 2.10 cm Lennie Odor MD Electronically signed by Lennie Odor MD Signature Date/Time: 01/19/2023/10:40:39 AM    Final    DG Chest Portable 1 View  Result Date: 01/18/2023 CLINICAL DATA:  Shortness of breath. EXAM: PORTABLE CHEST 1 VIEW COMPARISON:  Chest x-ray dated December 04, 2022. FINDINGS: The patient is rotated to the right. Unchanged mild cardiomegaly. Increased interstitial thickening at both lung bases with layering small bilateral pleural effusions and mild bibasilar atelectasis. No pneumothorax. No acute osseous abnormality. IMPRESSION: 1. Mild congestive heart failure. Electronically Signed   By: Obie Dredge M.D.   On: 01/18/2023 10:25     Discharge Instructions: Discharge Instructions     Ambulatory referral to Occupational Therapy   Complete by: As directed    Call MD for:  difficulty breathing, headache or visual disturbances   Complete by: As directed    Call MD for:  extreme fatigue   Complete by: As directed    Call MD for:  hives   Complete by: As directed    Call MD for:  persistant dizziness or light-headedness   Complete by: As directed    Call MD for:  persistant nausea and vomiting   Complete by: As directed    Call MD for:  redness, tenderness, or signs of infection (pain, swelling, redness, odor or green/yellow discharge around incision site)   Complete by: As directed    Call MD for:  severe uncontrolled  pain   Complete by: As directed    Call MD for:  temperature >100.4   Complete by: As directed    Diet - low sodium heart healthy   Complete by: As directed    Increase activity slowly   Complete by: As directed        Signed: Rana Snare, DO 01/21/2023, 2:58 PM   Pager: [phone]

## 2023-01-21 NOTE — Progress Notes (Signed)
Heart Failure Nurse Navigator Progress Note  PCP: Corrington, Kip A, MD PCP-Cardiologist: [place] Admission Diagnosis: Atrial Fibrillation with RVR, Shortness of breath, Acute on chronic congestive heart failure.  Admitted from: Home via EMS  Presentation:   Autumn [name] presented with shortness of breath, with EMS Oxygen sats were 70 %, given [name] duoneb and placed on CPAP, sats improved to 100 %. Reported that patient often misses taking her medication and she also smoked meth this past weekend. BNP 293.8, Troponin 31, CXR with mild CHF.   Patient was educated on the sign and symptoms of heart failure, daily weights, when to call her doctor or go to the ED. Diet/ fluid restrictions, taking all medications as prescribed, Polysubstance cessation, attending all her medical appointments. Patient verbalized her understanding, [name] HF TOC appointment was scheduled for 02/05/2023 @ 11 am.     ECHO/ LVEF: 35-40%  Clinical Course:  Past Medical History:  Diagnosis Date   Anxiety    Arthritis    Attention deficit disorder (ADD)    COPD (chronic obstructive pulmonary disease) (HCC)    Depression    Drug abuse (HCC)    Fibromyalgia    HFrEF (heart failure with reduced ejection fraction) (HCC)    Hyperlipidemia    Paroxysmal Atrial fibrillation      Social History   Socioeconomic History   Marital status: Divorced    Spouse name: Not on file   Number of children: Not on file   Years of education: Not on file   Highest education level: Not on file  Occupational History   Occupation: disabled  Tobacco Use   Smoking status: Every Day    Packs/day: .5    Types: Cigarettes   Smokeless tobacco: Never   Tobacco comments:    Half pack daily going to try patches  Vaping Use   Vaping Use: Every day  Substance and Sexual Activity   Alcohol use: Not Currently    Comment: [name] fifth of vodka [name] day - none in [name] year   Drug use: Yes    Types: Cocaine, Marijuana    Comment: uses marijuana  regularly; used cocaine " [name] long time ago"   Sexual activity: Not on file  Other Topics Concern   Not on file  Social History Narrative   Not on file   Social Determinants of Health   Financial Resource Strain: Not on file  Food Insecurity: Not on file  Transportation Needs: Not on file  Physical Activity: Not on file  Stress: Not on file  Social Connections: Not on file   Education Assessment and Provision:  Detailed education and instructions provided on heart failure disease management including the following:  Signs and symptoms of Heart Failure When to call the physician Importance of daily weights Low sodium diet Fluid restriction Medication management Anticipated future follow-up appointments  Patient education given on each of the above topics.  Patient acknowledges understanding via teach back method and acceptance of all instructions.  Education Materials:  "Living Better With Heart Failure" Booklet, HF zone tool, & Daily Weight Tracker Tool.  Patient has scale at home: Yes Patient has pill box at home: Yes    High Risk Criteria for Readmission and/or Poor Patient Outcomes: Heart failure hospital admissions (last 6 months): 0  No Show rate: 17% Difficult social situation: No, Lives with her Autumn [name] Demonstrates medication adherence: no, misses often per family Primary Language: english Literacy level: Reading, writing, and comprehension  Barriers of Care:  Medication compliance  Diet/ fluid restrictions/ daily weights Substance Cessation  Considerations/Referrals:   Referral made to Heart Failure Pharmacist Stewardship: Yes Referral made to Heart Failure CSW/NCM TOC: substance cessation, smoking, etc Referral made to Heart & Vascular TOC clinic: Yes, 02/05/2023 @ 11 am  Items for Follow-up on DC/TOC: Medication compliance  Diet/ fluid restrictions/ daily weights Substance Cessation Continued HF education   Rhae Hammock, BSN, RN Heart Failure  Teacher, adult education Only

## 2023-01-21 NOTE — Discharge Instructions (Addendum)
You were hospitalized for atrial fibrillation with fast heart rate and heart failure. We were able to remove the extra fluid and glad you are feeling better. Your heart rate has improved and blood pressure is stable. We changed some of your medications, please see below. Glad to see you received the home oxygen supplies. Thank you for allowing [place] to be part of your care.   Please make sure to follow-up with your cardiologist and primary care doctor, Corrington, Kip A, MD. Call your primary care doctor to schedule hospital follow up visit in 1-2 weeks.   Please note these changes made to your medications:  *Please START taking:  -Xarelto 20 mg one time a day (blood thinner) -Metoprolol 150 mg one time a day (3 tablets of 50 mg one time a day) -Farxiga 10 mg one time a day -Lasix 40 mg one time a day  *Please STOP taking:  -STOP Eliquis  -STOP Losartan -STOP Clonidine  -STOP Diltiazem  -STOP Lisinopril   Please make sure to follow with your primary care provider and specialists after leaving the hospital.

## 2023-02-04 ENCOUNTER — Ambulatory Visit: Payer: 59 | Admitting: General Practice

## 2023-02-05 ENCOUNTER — Encounter (HOSPITAL_COMMUNITY): Payer: 59

## 2023-02-08 ENCOUNTER — Ambulatory Visit: Payer: 59 | Attending: Internal Medicine | Admitting: Occupational Therapy

## 2023-02-09 ENCOUNTER — Encounter (HOSPITAL_COMMUNITY): Payer: 59

## 2023-02-16 NOTE — Progress Notes (Deleted)
Office Visit    Patient Name: Autumn [name] Date of Encounter: 02/16/2023  Primary Care Provider:  Vivien Presto, MD Primary Cardiologist:  Thomasene Ripple, DO  Chief Complaint  67 year old female with a history of CAD s/p stenting to the LAD in 2020, chronic HFrEF, paroxysmal atrial fibrillation, moderate mitral regurgitation, PAD, COPD, Parkinson's, substance use, and hypertension.  She is seen today for follow-up regarding her recent hospital admission for atrial fibrillation.  Past Medical History    Past Medical History:  Diagnosis Date   Anxiety    Arthritis    Attention deficit disorder (ADD)    COPD (chronic obstructive pulmonary disease) (HCC)    Depression    Drug abuse (HCC)    Fibromyalgia    HFrEF (heart failure with reduced ejection fraction) (HCC)    Hyperlipidemia    Paroxysmal Atrial fibrillation    Past Surgical History:  Procedure Laterality Date   ANKLE SURGERY     FACIAL COSMETIC SURGERY     Allergies Allergies  Allergen Reactions   Pamelor [Nortriptyline Hcl] Anaphylaxis   Vibra-Tab [Doxycycline] Other (See Comments)    Unknown reaction   Labs/Other Studies Reviewed    The following studies were reviewed today: Cardiac Studies & Procedures       ECHOCARDIOGRAM  ECHOCARDIOGRAM LIMITED 01/19/2023  Narrative ECHOCARDIOGRAM LIMITED REPORT    Patient Name:   Autumn [name] Cruise Date of Exam: 01/19/2023 Medical Rec #:  [id number]       Height:       64.0 in Accession #:    [id number]      Weight:       219.0 lb Date of Birth:  08/25/1955       BSA:          2.033 m Patient Age:    66 years        BP:           99/68 mmHg Patient Gender: F               HR:           99 bpm. Exam Location:  Inpatient  Procedure: Limited Echo, Limited Color Doppler and Cardiac Doppler  Indications:    CHF-Acute Systolic I50.21  History:        Patient has prior history of Echocardiogram examinations, most recent 12/03/2022. COPD, Arrythmias:Atrial  Fibrillation; Risk Factors:Dyslipidemia.  Sonographer:    Eulah Pont RDCS Referring Phys: [id number] CHRISTOPHER L SCHUMANN  IMPRESSIONS   1. Left ventricular ejection fraction, by estimation, is 35 to 40%. The left ventricle has moderately decreased function. The left ventricle demonstrates global hypokinesis. The left ventricular internal cavity size was mildly dilated. Left ventricular diastolic function could not be evaluated. 2. Right ventricular systolic function is moderately reduced. The right ventricular size is mildly enlarged. There is mildly elevated pulmonary artery systolic pressure. The estimated right ventricular systolic pressure is 43.3 mmHg. 3. The mitral valve is degenerative. Moderate to severe mitral valve regurgitation. No evidence of mitral stenosis. 4. The aortic valve is tricuspid. Aortic valve regurgitation is mild. Aortic valve sclerosis is present, with no evidence of aortic valve stenosis. 5. The inferior vena cava is dilated in size with <50% respiratory variability, suggesting right atrial pressure of 15 mmHg.  Comparison(s): No significant change from prior study. EF remains reduced and appears 35-40% on this study.  FINDINGS Left Ventricle: Left ventricular ejection fraction, by estimation, is 35 to 40%. The left ventricle has moderately decreased  function. The left ventricle demonstrates global hypokinesis. The left ventricular internal cavity size was mildly dilated. There is no left ventricular hypertrophy. Left ventricular diastolic function could not be evaluated. Left ventricular diastolic function could not be evaluated due to atrial fibrillation.  Right Ventricle: The right ventricular size is mildly enlarged. No increase in right ventricular wall thickness. Right ventricular systolic function is moderately reduced. There is mildly elevated pulmonary artery systolic pressure. The tricuspid regurgitant velocity is 2.66 m/s, and with an assumed right  atrial pressure of 15 mmHg, the estimated right ventricular systolic pressure is 43.3 mmHg.  Pericardium: There is no evidence of pericardial effusion.  Mitral Valve: The mitral valve is degenerative in appearance. Mild mitral annular calcification. Moderate to severe mitral valve regurgitation. No evidence of mitral valve stenosis.  Tricuspid Valve: The tricuspid valve is grossly normal. Tricuspid valve regurgitation is mild . No evidence of tricuspid stenosis.  Aortic Valve: The aortic valve is tricuspid. Aortic valve regurgitation is mild. Aortic regurgitation PHT measures 334 msec. Aortic valve sclerosis is present, with no evidence of aortic valve stenosis.  Aorta: The aortic root and ascending aorta are structurally normal, with no evidence of dilitation.  Venous: The inferior vena cava is dilated in size with less than 50% respiratory variability, suggesting right atrial pressure of 15 mmHg.  Additional Comments: Spectral Doppler performed. Color Doppler performed.  LEFT VENTRICLE PLAX 2D LVIDd:         5.60 cm LVIDs:         4.60 cm LV PW:         1.00 cm LV IVS:        1.00 cm LVOT diam:     2.10 cm LV SV:         51 LV SV Index:   25 LVOT Area:     3.46 cm  LV Volumes (MOD) LV vol d, MOD A2C: 180.0 ml LV vol d, MOD A4C: 152.0 ml LV vol s, MOD A2C: 107.0 ml LV vol s, MOD A4C: 88.4 ml LV SV MOD A2C:     73.0 ml LV SV MOD A4C:     152.0 ml LV SV MOD BP:      67.4 ml  RIGHT VENTRICLE TAPSE (M-mode): 1.3 cm  LEFT ATRIUM         Index LA diam:    5.60 cm 2.75 cm/m AORTIC VALVE LVOT Vmax:   99.30 cm/s LVOT Vmean:  68.667 cm/s LVOT VTI:    0.147 m AI PHT:      334 msec  AORTA Ao Root diam: 3.20 cm Ao Asc diam:  3.70 cm  MR Peak grad:    75.7 mmHg    TRICUSPID VALVE MR Mean grad:    54.0 mmHg    TR Peak grad:   28.3 mmHg MR Vmax:         435.00 cm/s  TR Vmax:        266.00 cm/s MR Vmean:        355.0 cm/s MR PISA:         0.57 cm     SHUNTS MR PISA Eff ROA:  5 mm        Systemic VTI:  0.15 m MR PISA Radius:  0.30 cm      Systemic Diam: 2.10 cm  Lennie Odor MD Electronically signed by Lennie Odor MD Signature Date/Time: 01/19/2023/10:40:39 AM    Final            Recent Labs:  12/03/2022: TSH 0.846 01/18/2023: ALT 26; B Natriuretic Peptide 293.8 01/19/2023: Hemoglobin 10.6; Platelets 252 01/20/2023: BUN 24; Creatinine, Ser 1.00; Magnesium 2.2; Potassium 4.1; Sodium 137  Recent Lipid Panel    Component Value Date/Time   CHOL 180 12/03/2022 1552   TRIG 65 12/03/2022 1552   HDL 58 12/03/2022 1552   CHOLHDL 3.1 12/03/2022 1552   VLDL 13 12/03/2022 1552   LDLCALC 109 (H) 12/03/2022 1552    History of Present Illness  67 year old female with a history of CAD s/p stenting to the LAD in 2020, chronic HFrEF, paroxysmal atrial fibrillation, moderate mitral regurgitation, PAD, COPD on 2L [place], Parkinson's, substance use, and hypertension. She has been followed by [facility].   In 2022 she is briefly followed by her A-fib clinic for A-fib RVR.  She presented to the ED 01/2021 with A-fib RVR.  DCCV was attempted in the ED but was unsuccessful leading to admission and rate control.  She converted on IV Cardizem.  Echo indicated at that time LVEF 60 to 65% without RWMA and normal diastolic function.  In January 2024 she was admitted for shortness of breath, found to be in A-fib RVR and was started on a Cardizem drip with improvement in rates.  Echocardiogram on 09/01/2022 indicated LVEF 40 to 45% with severe LAE, mild AI, moderate MR.  At her follow-up she remained in A-fib and underwent cardioversion but per patient had an early return to A-fib.  He was discharged on 200 mg Lopressor daily and Eliquis 5 mg twice daily.  Her subsequent outpatient family med visit showed methamphetamine and alcohol use while being on Suboxone.  Recent drug use suspected to have contributed to her A-fib RVR.  On 12/03/2022 she presented to the [facility],  ED with dyspnea palpitations noted to be in A-fib RVR.  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  Echo indicated EF 40 to 45%.  Her admission it was felt that her A-fib was likely to be permanent and beta-blocker dose was increased. GDMT for cardiomyopathy was difficult due to blood pressure being marginal.  On 01/18/2023 she presented to [facility], ED via EMS with shortness of breath.  Her oxygen saturation was 70% on room air, improved on CPAP and DuoNeb treatments.  Her heart rate reportedly was in the 200s with EMS.  She reported running out of her medications but later reported she had been taking her medications and was only out of oxygen.  Started on Cardizem drip for rate control.  During her admission she was switched from Eliquis to Xarelto in hopes of better compliance as she was only taking medications once daily.  She was started on Toprol XL 150 mg daily.  Her echo during admission indicated EF 35 to 40% and moderate to severe mitral regurgitation.  GDMT consisted of Toprol, losartan and Farxiga.  Noted to have soft blood pressures during her admission.  Atrial fibrillation with RVR: Likely permanent afib  Chronic anticoagulation:   CAD: Prior PCI/DES to the LAD  Chronic systolic HF: Echocardiograms in January and April 2024 indicated LVEF of 40 to 45%.  Echo in June 2024 indicated EF 35 to 40%.  BMP complicated by soft blood pressures.  Mitral regurgitation: On echo to be moderate to severe.  HTN: Blood pressure today  COPD:  On 2L [place]   Current smoker:  Home Medications    Current Outpatient Medications  Medication Sig Dispense Refill   albuterol (ACCUNEB) 0.63 MG/3ML nebulizer solution Take 1 ampule by nebulization every 6 (six) hours as needed for wheezing or shortness of breath.     Albuterol Sulfate, sensor, 108 (90 Base)  MCG/ACT AEPB Inhale 2 puffs into the lungs every 6 (six) hours as needed (wheezing, shortness of breath).     ARIPiprazole (ABILIFY) 10 MG tablet Take 1 tablet (10 mg total) by mouth daily. 30 tablet 2   atorvastatin (LIPITOR) 80 MG tablet Take 1 tablet (80 mg total) by mouth daily. 30 tablet 0   Budeson-Glycopyrrol-Formoterol (BREZTRI AEROSPHERE) 160-9-4.8 MCG/ACT AERO Inhale 2 puffs into the lungs 2 (two) times daily.     Buprenorphine HCl-Naloxone HCl (SUBOXONE) 8-2 MG FILM Place 1 Film under the tongue 3 (three) times daily.     busPIRone (BUSPAR) 10 MG tablet Take 10 mg by mouth 2 (two) times daily.     carbidopa-levodopa (SINEMET IR) 25-100 MG tablet Take 1 tablet by mouth 3 (three) times daily.     dapagliflozin propanediol (FARXIGA) 5 MG TABS tablet Take 2 tablets (10 mg total) by mouth daily. (Patient not taking: Reported on 01/19/2023) 30 tablet 0   diclofenac Sodium (VOLTAREN) 1 % GEL Apply 2-4 g topically 4 (four) times daily as needed (pain).     DULoxetine (CYMBALTA) 60 MG capsule Take 1 capsule (60 mg total) by mouth daily. 30 capsule 2   fluticasone (FLONASE) 50 MCG/ACT nasal spray Place 2 sprays into both nostrils daily as needed for allergies.     furosemide (LASIX) 40 MG tablet Take 40 mg by mouth daily.     gabapentin (NEURONTIN) 600 MG tablet Take 600 mg by mouth 3 (three) times daily.     guaiFENesin-dextromethorphan (ROBITUSSIN DM) 100-10 MG/5ML syrup Take 5 mLs by mouth every 6 (six) hours as needed for cough. 118 mL 0   melatonin 3 MG TABS tablet Take 1 tablet (3 mg total) by mouth at bedtime as needed. (Patient taking differently: Take 3 mg by mouth at bedtime as needed (sleep).)  0   metoprolol succinate (TOPROL-XL) 50 MG 24 hr tablet Take 3 tablets (150 mg total) by mouth daily. Take with or immediately following a meal. 90 tablet 0   Multiple Vitamin (MULTIVITAMIN WITH MINERALS) TABS Take 1 tablet by mouth every morning.      naloxone (NARCAN) nasal spray 4 mg/0.1 mL  Place 4 mg into the nose as needed (overdose, as needed).     pantoprazole (PROTONIX) 40 MG tablet Take 1 tablet (40 mg total) by mouth daily. 90 tablet 1   rivaroxaban (XARELTO) 20 MG TABS tablet Take 1 tablet (20 mg total) by mouth daily. 30 tablet 0   No current facility-administered medications for this visit.     Review of Systems    ***.  All other systems reviewed and are otherwise negative except as noted above.    Physical Exam    VS:  There were no vitals taken for this visit. , BMI There is no height or weight on file to calculate BMI.     GEN: Well nourished, well developed, in no acute distress. HEENT: normal. Neck: Supple, no JVD, carotid bruits, or masses. Cardiac: RRR, no murmurs, rubs, or gallops. No clubbing, cyanosis, edema.  Radials/DP/PT 2+ and equal bilaterally.  Respiratory:  Respirations regular and unlabored, clear to auscultation bilaterally. GI: Soft, nontender, nondistended, BS + x 4. MS: no deformity or  atrophy. Skin: warm and dry, no rash. Neuro:  Strength and sensation are intact. Psych: Normal affect.  Accessory Clinical Findings    ECG personally reviewed by me today -    - no acute changes.   Lab Results  Component Value Date   WBC 9.9 01/19/2023   HGB 10.6 (L) 01/19/2023   HCT 33.8 (L) 01/19/2023   MCV 87.8 01/19/2023   PLT 252 01/19/2023   Lab Results  Component Value Date   CREATININE 1.00 01/20/2023   BUN 24 (H) 01/20/2023   NA 137 01/20/2023   K 4.1 01/20/2023   CL 97 (L) 01/20/2023   CO2 32 01/20/2023   Lab Results  Component Value Date   ALT 26 01/18/2023   AST 39 01/18/2023   ALKPHOS 73 01/18/2023   BILITOT 0.5 01/18/2023   Lab Results  Component Value Date   CHOL 180 12/03/2022   HDL 58 12/03/2022   LDLCALC 109 (H) 12/03/2022   TRIG 65 12/03/2022   CHOLHDL 3.1 12/03/2022    No results found for: "HGBA1C"  Assessment & Plan    1.  ***  No BP recorded.  {Refresh Note OR Click here to enter BP  :1}***    Rip Harbour, NP 02/16/2023, 8:30 PM

## 2023-02-17 ENCOUNTER — Encounter (HOSPITAL_COMMUNITY): Payer: Self-pay

## 2023-02-17 ENCOUNTER — Emergency Department (HOSPITAL_COMMUNITY): Payer: 59

## 2023-02-17 ENCOUNTER — Other Ambulatory Visit: Payer: Self-pay

## 2023-02-17 ENCOUNTER — Inpatient Hospital Stay (HOSPITAL_COMMUNITY)
Admission: EM | Admit: 2023-02-17 | Discharge: 2023-02-22 | DRG: 291 | Disposition: A | Discharge status: Home / Self Care | Payer: 59 | Attending: Internal Medicine | Admitting: Internal Medicine

## 2023-02-17 DIAGNOSIS — F1110 Opioid abuse, uncomplicated: Secondary | ICD-10-CM | POA: Diagnosis present

## 2023-02-17 DIAGNOSIS — J9601 Acute respiratory failure with hypoxia: Secondary | ICD-10-CM | POA: Diagnosis present

## 2023-02-17 DIAGNOSIS — I4821 Permanent atrial fibrillation: Secondary | ICD-10-CM | POA: Diagnosis present

## 2023-02-17 DIAGNOSIS — I5023 Acute on chronic systolic (congestive) heart failure: Secondary | ICD-10-CM | POA: Diagnosis present

## 2023-02-17 DIAGNOSIS — I2510 Atherosclerotic heart disease of native coronary artery without angina pectoris: Secondary | ICD-10-CM | POA: Diagnosis present

## 2023-02-17 DIAGNOSIS — I4819 Other persistent atrial fibrillation: Secondary | ICD-10-CM | POA: Diagnosis not present

## 2023-02-17 DIAGNOSIS — I4811 Longstanding persistent atrial fibrillation: Secondary | ICD-10-CM | POA: Diagnosis not present

## 2023-02-17 DIAGNOSIS — Z7951 Long term (current) use of inhaled steroids: Secondary | ICD-10-CM

## 2023-02-17 DIAGNOSIS — J449 Chronic obstructive pulmonary disease, unspecified: Secondary | ICD-10-CM | POA: Diagnosis present

## 2023-02-17 DIAGNOSIS — F329 Major depressive disorder, single episode, unspecified: Secondary | ICD-10-CM | POA: Diagnosis present

## 2023-02-17 DIAGNOSIS — E785 Hyperlipidemia, unspecified: Secondary | ICD-10-CM | POA: Diagnosis present

## 2023-02-17 DIAGNOSIS — I5020 Unspecified systolic (congestive) heart failure: Secondary | ICD-10-CM

## 2023-02-17 DIAGNOSIS — Z91199 Patient's noncompliance with other medical treatment and regimen due to unspecified reason: Secondary | ICD-10-CM | POA: Diagnosis not present

## 2023-02-17 DIAGNOSIS — F32A Depression, unspecified: Secondary | ICD-10-CM | POA: Diagnosis present

## 2023-02-17 DIAGNOSIS — Z881 Allergy status to other antibiotic agents status: Secondary | ICD-10-CM | POA: Diagnosis not present

## 2023-02-17 DIAGNOSIS — I5043 Acute on chronic combined systolic (congestive) and diastolic (congestive) heart failure: Secondary | ICD-10-CM | POA: Diagnosis not present

## 2023-02-17 DIAGNOSIS — I493 Ventricular premature depolarization: Secondary | ICD-10-CM | POA: Diagnosis present

## 2023-02-17 DIAGNOSIS — I519 Heart disease, unspecified: Secondary | ICD-10-CM | POA: Diagnosis not present

## 2023-02-17 DIAGNOSIS — J441 Chronic obstructive pulmonary disease with (acute) exacerbation: Secondary | ICD-10-CM | POA: Diagnosis present

## 2023-02-17 DIAGNOSIS — F17290 Nicotine dependence, other tobacco product, uncomplicated: Secondary | ICD-10-CM | POA: Diagnosis present

## 2023-02-17 DIAGNOSIS — F322 Major depressive disorder, single episode, severe without psychotic features: Secondary | ICD-10-CM | POA: Diagnosis not present

## 2023-02-17 DIAGNOSIS — Z955 Presence of coronary angioplasty implant and graft: Secondary | ICD-10-CM

## 2023-02-17 DIAGNOSIS — F1190 Opioid use, unspecified, uncomplicated: Secondary | ICD-10-CM

## 2023-02-17 DIAGNOSIS — G894 Chronic pain syndrome: Secondary | ICD-10-CM | POA: Diagnosis present

## 2023-02-17 DIAGNOSIS — I428 Other cardiomyopathies: Secondary | ICD-10-CM | POA: Diagnosis present

## 2023-02-17 DIAGNOSIS — F419 Anxiety disorder, unspecified: Secondary | ICD-10-CM | POA: Diagnosis present

## 2023-02-17 DIAGNOSIS — B182 Chronic viral hepatitis C: Secondary | ICD-10-CM | POA: Diagnosis present

## 2023-02-17 DIAGNOSIS — Z7901 Long term (current) use of anticoagulants: Secondary | ICD-10-CM

## 2023-02-17 DIAGNOSIS — I480 Paroxysmal atrial fibrillation: Secondary | ICD-10-CM | POA: Diagnosis not present

## 2023-02-17 DIAGNOSIS — E669 Obesity, unspecified: Secondary | ICD-10-CM | POA: Diagnosis present

## 2023-02-17 DIAGNOSIS — I340 Nonrheumatic mitral (valve) insufficiency: Secondary | ICD-10-CM | POA: Diagnosis not present

## 2023-02-17 DIAGNOSIS — I110 Hypertensive heart disease with heart failure: Secondary | ICD-10-CM | POA: Diagnosis not present

## 2023-02-17 DIAGNOSIS — G20A1 Parkinson's disease without dyskinesia, without mention of fluctuations: Secondary | ICD-10-CM | POA: Diagnosis present

## 2023-02-17 DIAGNOSIS — Z79899 Other long term (current) drug therapy: Secondary | ICD-10-CM | POA: Diagnosis not present

## 2023-02-17 DIAGNOSIS — Z888 Allergy status to other drugs, medicaments and biological substances status: Secondary | ICD-10-CM | POA: Diagnosis not present

## 2023-02-17 DIAGNOSIS — I4891 Unspecified atrial fibrillation: Secondary | ICD-10-CM | POA: Diagnosis present

## 2023-02-17 DIAGNOSIS — F1510 Other stimulant abuse, uncomplicated: Secondary | ICD-10-CM | POA: Diagnosis present

## 2023-02-17 DIAGNOSIS — E119 Type 2 diabetes mellitus without complications: Secondary | ICD-10-CM | POA: Diagnosis present

## 2023-02-17 DIAGNOSIS — F17210 Nicotine dependence, cigarettes, uncomplicated: Secondary | ICD-10-CM | POA: Diagnosis present

## 2023-02-17 DIAGNOSIS — Z6835 Body mass index (BMI) 35.0-35.9, adult: Secondary | ICD-10-CM

## 2023-02-17 DIAGNOSIS — J9621 Acute and chronic respiratory failure with hypoxia: Secondary | ICD-10-CM | POA: Diagnosis present

## 2023-02-17 DIAGNOSIS — Z91148 Patient's other noncompliance with medication regimen for other reason: Secondary | ICD-10-CM

## 2023-02-17 DIAGNOSIS — I509 Heart failure, unspecified: Principal | ICD-10-CM

## 2023-02-17 DIAGNOSIS — M797 Fibromyalgia: Secondary | ICD-10-CM | POA: Diagnosis present

## 2023-02-17 DIAGNOSIS — Z8679 Personal history of other diseases of the circulatory system: Secondary | ICD-10-CM | POA: Diagnosis not present

## 2023-02-17 DIAGNOSIS — Z79891 Long term (current) use of opiate analgesic: Secondary | ICD-10-CM

## 2023-02-17 LAB — CBC WITH DIFFERENTIAL/PLATELET
Abs Immature Granulocytes: 0.03 10*3/uL (ref 0.00–0.07)
Basophils Absolute: 0 10*3/uL (ref 0.0–0.1)
Basophils Relative: 0 %
Eosinophils Absolute: 0.1 10*3/uL (ref 0.0–0.5)
Eosinophils Relative: 1 %
HCT: 33.9 % — ABNORMAL LOW (ref 36.0–46.0)
Hemoglobin: 10.5 g/dL — ABNORMAL LOW (ref 12.0–15.0)
Immature Granulocytes: 0 %
Lymphocytes Relative: 19 %
Lymphs Abs: 1.9 10*3/uL (ref 0.7–4.0)
MCH: 27.7 pg (ref 26.0–34.0)
MCHC: 31 g/dL (ref 30.0–36.0)
MCV: 89.4 fL (ref 80.0–100.0)
Monocytes Absolute: 0.5 10*3/uL (ref 0.1–1.0)
Monocytes Relative: 5 %
Neutro Abs: 7.2 10*3/uL (ref 1.7–7.7)
Neutrophils Relative %: 75 %
Platelets: 303 10*3/uL (ref 150–400)
RBC: 3.79 MIL/uL — ABNORMAL LOW (ref 3.87–5.11)
RDW: 17.2 % — ABNORMAL HIGH (ref 11.5–15.5)
WBC: 9.8 10*3/uL (ref 4.0–10.5)
nRBC: 0 % (ref 0.0–0.2)

## 2023-02-17 LAB — I-STAT VENOUS BLOOD GAS, ED
Acid-Base Excess: 1 mmol/L (ref 0.0–2.0)
Bicarbonate: 25.4 mmol/L (ref 20.0–28.0)
Calcium, Ion: 1.02 mmol/L — ABNORMAL LOW (ref 1.15–1.40)
HCT: 34 % — ABNORMAL LOW (ref 36.0–46.0)
Hemoglobin: 11.6 g/dL — ABNORMAL LOW (ref 12.0–15.0)
O2 Saturation: 76 %
Potassium: 4.1 mmol/L (ref 3.5–5.1)
Sodium: 135 mmol/L (ref 135–145)
TCO2: 27 mmol/L (ref 22–32)
pCO2, Ven: 37.7 mmHg — ABNORMAL LOW (ref 44–60)
pH, Ven: 7.437 — ABNORMAL HIGH (ref 7.25–7.43)
pO2, Ven: 39 mmHg (ref 32–45)

## 2023-02-17 LAB — BASIC METABOLIC PANEL
Anion gap: 9 (ref 5–15)
BUN: 8 mg/dL (ref 8–23)
CO2: 23 mmol/L (ref 22–32)
Calcium: 8.6 mg/dL — ABNORMAL LOW (ref 8.9–10.3)
Chloride: 103 mmol/L (ref 98–111)
Creatinine, Ser: 0.81 mg/dL (ref 0.44–1.00)
GFR, Estimated: >60 mL/min (ref >60)
Glucose, Bld: 116 mg/dL — ABNORMAL HIGH (ref 70–99)
Potassium: 3.8 mmol/L (ref 3.5–5.1)
Sodium: 135 mmol/L (ref 135–145)

## 2023-02-17 LAB — I-STAT ARTERIAL BLOOD GAS, ED
Acid-Base Excess: 1 mmol/L (ref 0.0–2.0)
Bicarbonate: 27.2 mmol/L (ref 20.0–28.0)
Calcium, Ion: 1.2 mmol/L (ref 1.15–1.40)
HCT: 35 % — ABNORMAL LOW (ref 36.0–46.0)
Hemoglobin: 11.9 g/dL — ABNORMAL LOW (ref 12.0–15.0)
O2 Saturation: 95 %
Potassium: 3.7 mmol/L (ref 3.5–5.1)
Sodium: 135 mmol/L (ref 135–145)
TCO2: 29 mmol/L (ref 22–32)
pCO2 arterial: 47.9 mmHg (ref 32–48)
pH, Arterial: 7.361 (ref 7.35–7.45)
pO2, Arterial: 82 mmHg — ABNORMAL LOW (ref 83–108)

## 2023-02-17 LAB — APTT: aPTT: 28 seconds (ref 24–36)

## 2023-02-17 LAB — MAGNESIUM: Magnesium: 1.8 mg/dL (ref 1.7–2.4)

## 2023-02-17 LAB — BRAIN NATRIURETIC PEPTIDE: B Natriuretic Peptide: 1231.2 pg/mL — ABNORMAL HIGH (ref 0.0–100.0)

## 2023-02-17 LAB — TROPONIN I (HIGH SENSITIVITY)
Troponin I (High Sensitivity): 17 ng/L (ref <18)
Troponin I (High Sensitivity): 15 ng/L (ref <18)

## 2023-02-17 LAB — HEPARIN LEVEL (UNFRACTIONATED): Heparin Unfractionated: 0.32 IU/mL (ref 0.30–0.70)

## 2023-02-17 MED ORDER — FUROSEMIDE 10 MG/ML IJ SOLN
40.0000 mg | Freq: Two times a day (BID) | INTRAMUSCULAR | Status: DC
  Administered 2023-02-18 – 2023-02-19 (×3): 40 mg via INTRAVENOUS
  Filled 2023-02-17 (×3): qty 4

## 2023-02-17 MED ORDER — ENOXAPARIN SODIUM 100 MG/ML IJ SOSY
1.0000 mg/kg | PREFILLED_SYRINGE | Freq: Two times a day (BID) | INTRAMUSCULAR | Status: DC
  Filled 2023-02-17: qty 1

## 2023-02-17 MED ORDER — ENOXAPARIN SODIUM 100 MG/ML IJ SOSY: 1.0000 mg/kg | PREFILLED_SYRINGE | Freq: Two times a day (BID) | INTRAMUSCULAR | Status: DC

## 2023-02-17 MED ORDER — ENOXAPARIN SODIUM 100 MG/ML IJ SOSY
1.0000 mg/kg | PREFILLED_SYRINGE | Freq: Two times a day (BID) | INTRAMUSCULAR | Status: DC
  Administered 2023-02-18: 100 mg via SUBCUTANEOUS
  Filled 2023-02-17: qty 1

## 2023-02-17 MED ORDER — IPRATROPIUM-ALBUTEROL 0.5-2.5 (3) MG/3ML IN SOLN
3.0000 mL | Freq: Four times a day (QID) | RESPIRATORY_TRACT | Status: DC
  Administered 2023-02-17 – 2023-02-18 (×2): 3 mL via RESPIRATORY_TRACT
  Filled 2023-02-17 (×2): qty 3

## 2023-02-17 MED ORDER — MAGNESIUM SULFATE 2 GM/50ML IV SOLN
2.0000 g | Freq: Once | INTRAVENOUS | Status: AC
  Administered 2023-02-17: 2 g via INTRAVENOUS
  Filled 2023-02-17: qty 50

## 2023-02-17 MED ORDER — METOPROLOL TARTRATE 5 MG/5ML IV SOLN
2.5000 mg | INTRAVENOUS | Status: DC | PRN
  Administered 2023-02-17 – 2023-02-18 (×7): 2.5 mg via INTRAVENOUS
  Filled 2023-02-17 (×4): qty 5

## 2023-02-17 MED ORDER — ENOXAPARIN SODIUM 40 MG/0.4ML IJ SOSY
40.0000 mg | PREFILLED_SYRINGE | INTRAMUSCULAR | Status: DC
  Administered 2023-02-17: 40 mg via SUBCUTANEOUS
  Filled 2023-02-17: qty 0.4

## 2023-02-17 MED ORDER — DIAZEPAM 5 MG/ML IJ SOLN: 2.5000 mg | Freq: Once | INTRAMUSCULAR | Status: DC

## 2023-02-17 MED ORDER — ACETAMINOPHEN 325 MG PO TABS: 650.0000 mg | ORAL_TABLET | Freq: Four times a day (QID) | ORAL | Status: DC | PRN

## 2023-02-17 MED ORDER — ACETAMINOPHEN 650 MG RE SUPP: 650.0000 mg | Freq: Four times a day (QID) | RECTAL | Status: DC | PRN

## 2023-02-17 MED ORDER — BUDESONIDE 0.5 MG/2ML IN SUSP
0.5000 mg | Freq: Two times a day (BID) | RESPIRATORY_TRACT | Status: DC
  Administered 2023-02-18 – 2023-02-22 (×8): 0.5 mg via RESPIRATORY_TRACT
  Filled 2023-02-17 (×10): qty 2

## 2023-02-17 MED ORDER — METHYLPREDNISOLONE SODIUM SUCC 40 MG IJ SOLR
40.0000 mg | Freq: Once | INTRAMUSCULAR | Status: AC
  Administered 2023-02-17: 40 mg via INTRAVENOUS
  Filled 2023-02-17: qty 1

## 2023-02-17 MED ORDER — FUROSEMIDE 10 MG/ML IJ SOLN: 40.0000 mg | Freq: Two times a day (BID) | INTRAMUSCULAR | Status: DC

## 2023-02-17 MED ORDER — FUROSEMIDE 10 MG/ML IJ SOLN
40.0000 mg | Freq: Once | INTRAMUSCULAR | Status: AC
  Administered 2023-02-17: 40 mg via INTRAVENOUS
  Filled 2023-02-17: qty 4

## 2023-02-17 MED ORDER — SENNOSIDES-DOCUSATE SODIUM 8.6-50 MG PO TABS: 1.0000 | ORAL_TABLET | Freq: Every evening | ORAL | Status: DC | PRN

## 2023-02-17 MED ORDER — FUROSEMIDE 10 MG/ML IJ SOLN
60.0000 mg | Freq: Once | INTRAMUSCULAR | Status: AC
  Administered 2023-02-17: 60 mg via INTRAVENOUS
  Filled 2023-02-17: qty 6

## 2023-02-17 NOTE — ED Provider Notes (Signed)
[place] [facility]ERGENCY DEPARTMENT AT [facility] Provider Note   CSN: [id number] Arrival date & time: 02/17/23  0815     History  Chief Complaint  Patient presents with   Shortness of Breath    Autumn [name] is a 67 y.o. female.  Pt is a 67 yo female with pmhx significant for arthritis, add, hx drug abuse, fibromyalgia, hld, copd, depression, anxiety, paroxysmal afib on Xarelto, chf (EF 35-40% on 6/11).  Pt admits to being noncompliant with meds since d/c from the hospital on 6/13 for afib with rvr and CHF exac.  She noticed some increase swelling in her legs with sob 2 days ago.  She did take her meds yesterday.  She had increased sob overnight and this am, so she called EMS.  She increased her oxygen up to 4L, but is supposed to be on 2L.         Home Medications Prior to Admission medications   Medication Sig Start Date End Date Taking? Authorizing Provider  albuterol (ACCUNEB) 0.63 MG/3ML nebulizer solution Take 1 ampule by nebulization every 6 (six) hours as needed for wheezing or shortness of breath.    [provider]  Albuterol Sulfate, sensor, 108 (90 Base) MCG/ACT AEPB Inhale 2 puffs into the lungs every 6 (six) hours as needed (wheezing, shortness of breath).    [provider]  ARIPiprazole (ABILIFY) 10 MG tablet Take 1 tablet (10 mg total) by mouth daily. 01/15/21 03/20/23  [place], Alvira Philips, DO  atorvastatin (LIPITOR) 80 MG tablet Take 1 tablet (80 mg total) by mouth daily. 12/07/22   Carrion-Carrero, Karle Starch, MD  Budeson-Glycopyrrol-Formoterol (BREZTRI AEROSPHERE) 160-9-4.8 MCG/ACT AERO Inhale 2 puffs into the lungs 2 (two) times daily. 02/23/22   [provider]  Buprenorphine HCl-Naloxone HCl (SUBOXONE) 8-2 MG FILM Place 1 Film under the tongue 3 (three) times daily.    [provider]  busPIRone (BUSPAR) 10 MG tablet Take 10 mg by mouth 2 (two) times daily. 12/01/21   [provider]  carbidopa-levodopa (SINEMET IR)  25-100 MG tablet Take 1 tablet by mouth 3 (three) times daily. 01/21/23   Rana Snare, DO  dapagliflozin propanediol (FARXIGA) 5 MG TABS tablet Take 2 tablets (10 mg total) by mouth daily. Patient not taking: Reported on 01/19/2023 12/06/22   Lorri Frederick, MD  diclofenac Sodium (VOLTAREN) 1 % GEL Apply 2-4 g topically 4 (four) times daily as needed (pain). 12/18/20   [provider]  DULoxetine (CYMBALTA) 60 MG capsule Take 1 capsule (60 mg total) by mouth daily. 01/15/21 12/03/23  [place], Eric J, DO  fluticasone (FLONASE) 50 MCG/ACT nasal spray Place 2 sprays into both nostrils daily as needed for allergies. 02/23/22   [provider]  furosemide (LASIX) 40 MG tablet Take 40 mg by mouth daily. 09/08/22   [provider]  gabapentin (NEURONTIN) 600 MG tablet Take 600 mg by mouth 3 (three) times daily. 09/01/22   [provider]  guaiFENesin-dextromethorphan (ROBITUSSIN DM) 100-10 MG/5ML syrup Take 5 mLs by mouth every 6 (six) hours as needed for cough. 12/06/22   Carrion-Carrero, Karle Starch, MD  melatonin 3 MG TABS tablet Take 1 tablet (3 mg total) by mouth at bedtime as needed. Patient taking differently: Take 3 mg by mouth at bedtime as needed (sleep). 12/06/22   Carrion-Carrero, Karle Starch, MD  metoprolol succinate (TOPROL-XL) 50 MG 24 hr tablet Take 3 tablets (150 mg total) by mouth daily. Take with or immediately following a meal. 01/21/23   Sherrilee Gilles,  Casimiro Needle, DO  Multiple Vitamin (MULTIVITAMIN WITH MINERALS) TABS Take 1 tablet by mouth every morning.     [provider]  naloxone [facility]) nasal spray 4 mg/0.1 mL Place 4 mg into the nose as needed (overdose, as needed). 10/06/19   [provider]  pantoprazole (PROTONIX) 40 MG tablet Take 1 tablet (40 mg total) by mouth daily. 01/15/21 03/20/23  [place], Alvira Philips, DO  rivaroxaban (XARELTO) 20 MG TABS tablet Take 1 tablet (20 mg total) by mouth daily. 01/22/23   Rana Snare, DO      Allergies     Pamelor [nortriptyline hcl] and Vibra-tab [doxycycline]    Review of Systems   Review of Systems  Respiratory:  Positive for shortness of breath.   All other systems reviewed and are negative.   Physical Exam Updated Vital Signs BP (!) 128/108   Pulse 67   Temp 98 F (36.7 C)   Resp 17   Wt 99.8 kg   SpO2 98%   BMI 37.77 kg/m  Physical Exam Vitals and nursing note reviewed.  Constitutional:      General: She is in acute distress.     Appearance: She is well-developed.  HENT:     Head: Normocephalic and atraumatic.     Mouth/Throat:     Mouth: Mucous membranes are moist.  Eyes:     Pupils: Pupils are equal, round, and reactive to light.  Cardiovascular:     Rate and Rhythm: Tachycardia present. Rhythm irregular.  Pulmonary:     Effort: Tachypnea and respiratory distress present.     Breath sounds: Rhonchi present.  Abdominal:     General: Bowel sounds are normal.     Palpations: Abdomen is soft.  Musculoskeletal:     Cervical back: Normal range of motion and neck supple.     Right lower leg: Edema present.     Left lower leg: Edema present.  Skin:    General: Skin is warm.     Capillary Refill: Capillary refill takes less than 2 seconds.  Neurological:     General: No focal deficit present.     Mental Status: She is alert and oriented to person, place, and time.  Psychiatric:        Mood and Affect: Mood normal.        Behavior: Behavior normal.     ED Results / Procedures / Treatments   Labs (all labs ordered are listed, but only abnormal results are displayed) Labs Reviewed  BASIC METABOLIC PANEL - Abnormal; Notable for the following components:      Result Value   Glucose, Bld 116 (*)    Calcium 8.6 (*)    All other components within normal limits  CBC WITH DIFFERENTIAL/PLATELET - Abnormal; Notable for the following components:   RBC 3.79 (*)    Hemoglobin 10.5 (*)    HCT 33.9 (*)    RDW 17.2 (*)    All other components within normal limits   BRAIN NATRIURETIC PEPTIDE - Abnormal; Notable for the following components:   B Natriuretic Peptide 1,231.2 (*)    All other components within normal limits  I-STAT VENOUS BLOOD GAS, ED - Abnormal; Notable for the following components:   pH, Ven 7.437 (*)    pCO2, Ven 37.7 (*)    Calcium, Ion 1.02 (*)    HCT 34.0 (*)    Hemoglobin 11.6 (*)    All other components within normal limits  MAGNESIUM  TROPONIN I (HIGH SENSITIVITY)  TROPONIN  I (HIGH SENSITIVITY)    EKG EKG Interpretation Date/Time:  Wednesday February 17 2023 08:23:03 EDT Ventricular Rate:  107 PR Interval:    QRS Duration:  89 QT Interval:  377 QTC Calculation: 503 R Axis:   64  Text Interpretation: Atrial fibrillation Ventricular bigeminy Borderline T abnormalities, anterior leads Prolonged QT interval Since last tracing rate slower Confirmed by Jacalyn Lefevre [phone]) on 02/17/2023 8:32:10 AM  Radiology DG Chest Port 1 View  Result Date: 02/17/2023 CLINICAL DATA:  sob EXAM: PORTABLE CHEST 1 VIEW COMPARISON:  01/18/2023 FINDINGS: Stable cardiomegaly. Aortic atherosclerosis. Pulmonary vascular congestion with bilateral interstitial opacities, most pronounced in the perihilar and bibasilar regions. Small bilateral pleural effusions, right greater than left. No pneumothorax. IMPRESSION: Findings of CHF with pulmonary edema and small bilateral pleural effusions. Electronically Signed   By: Duanne Guess D.O.   On: 02/17/2023 08:44    Procedures Procedures    Medications Ordered in ED Medications  magnesium sulfate IVPB 2 g 50 mL (has no administration in time range)  enoxaparin (LOVENOX) injection 40 mg (has no administration in time range)  acetaminophen (TYLENOL) tablet 650 mg (has no administration in time range)    Or  acetaminophen (TYLENOL) suppository 650 mg (has no administration in time range)  senna-docusate (Senokot-S) tablet 1 tablet (has no administration in time range)  furosemide (LASIX) injection 40  mg (40 mg Intravenous Given 02/17/23 0845)    ED Course/ Medical Decision Making/ A&P                             Medical Decision Making Amount and/or Complexity of Data Reviewed Labs: ordered. Radiology: ordered.  Risk Prescription drug management. Decision regarding hospitalization.   This patient presents to the ED for concern of sob, this involves an extensive number of treatment options, and is a complaint that carries with it a high risk of complications and morbidity.  The differential diagnosis includes chf, afib, copd, pna   Co morbidities that complicate the patient evaluation  arthritis, add, hx drug abuse, fibromyalgia, hld, copd, depression, anxiety, paroxysmal afib on Xarelto, chf   Additional history obtained:  Additional history obtained from epic chart review External records from outside source obtained and reviewed including EMS report   Lab Tests:  I Ordered, and personally interpreted labs.  The pertinent results include:  cbc with hgb 10.5 (stable), bmp nl, trop nl, BNP elevated at 1231.2, mg nl   Imaging Studies ordered:  I ordered imaging studies including cxr  I independently visualized and interpreted imaging which showed  Findings of CHF with pulmonary edema and small bilateral pleural  effusions.   I agree with the radiologist interpretation   Cardiac Monitoring:  The patient was maintained on a cardiac monitor.  I personally viewed and interpreted the cardiac monitored which showed an underlying rhythm of: afib   Medicines ordered and prescription drug management:  I ordered medication including lasix  for sob  Reevaluation of the patient after these medicines showed that the patient improved I have reviewed the patients home medicines and have made adjustments as needed   Test Considered:  cxr   Critical Interventions:  bipap   Consultations Obtained:  I requested consultation with IMT[facility],  and discussed lab and imaging  findings as well as pertinent plan - they will admit.   Problem List / ED Course:  CHF exacerbation:  pt given lasix and put on bipap and has improved.  Reevaluation:  After the interventions noted above, I reevaluated the patient and found that they have :improved   Social Determinants of Health:  Lives at home   Dispostion:  After consideration of the diagnostic results and the patients response to treatment, I feel that the patent would benefit from admission.  CRITICAL CARE Performed by: Jacalyn Lefevre   Total critical care time: 30 minutes  Critical care time was exclusive of separately billable procedures and treating other patients.  Critical care was necessary to treat or prevent imminent or life-threatening deterioration.  Critical care was time spent personally by me on the following activities: development of treatment plan with patient and/or surrogate as well as nursing, discussions with consultants, evaluation of patient's response to treatment, examination of patient, obtaining history from patient or surrogate, ordering and performing treatments and interventions, ordering and review of laboratory studies, ordering and review of radiographic studies, pulse oximetry and re-evaluation of patient's condition.           Final Clinical Impression(s) / ED Diagnoses Final diagnoses:  Acute on chronic congestive heart failure, unspecified heart failure type (HCC)  Paroxysmal atrial fibrillation [facility])    Rx / DC Orders ED Discharge Orders     None         Jacalyn Lefevre, MD 02/17/23 1222

## 2023-02-17 NOTE — ED Notes (Signed)
ED TO INPATIENT HANDOFF REPORT  ED Nurse Name and Phone #: [phone]  S Name/Age/Gender Autumn [name] 67 y.o. female Room/Bed: 033C/033C  Code Status   Code Status: Full Code  Home/SNF/Other Home Patient oriented to: self, place, time, and situation Is this baseline? Yes   Triage Complete: Triage complete  Chief Complaint Acute hypoxic respiratory failure (HCC) [J96.01]  Triage Note Pt BIB [facility] from home d/t sob that has been going on for couple of weeks. Pt has been off of lasix for awhile instructed by her PCP. Hx AFIB. Wears 4 L at baseline. A&O X4. ALSO C/O CP w exertion. VSS.    Allergies Allergies  Allergen Reactions   Pamelor [Nortriptyline Hcl] Anaphylaxis   Vibra-Tab [Doxycycline] Other (See Comments)    Unknown reaction    Level of Care/Admitting Diagnosis ED Disposition     ED Disposition  Admit   Condition  --   Comment  Hospital Area: [facility] [[id number]]  Level of Care: Telemetry Medical [104]  May admit patient to [facility] or [facility] if equivalent level of care is available:: No  Covid Evaluation: Asymptomatic - no recent exposure (last 10 days) testing not required  Diagnosis: Acute hypoxic respiratory failure [facility]) [[id number]]  Admitting Physician: Tyson Alias [phone]  Attending Physician: Tyson Alias [id number]  Certification:: I certify this patient will need inpatient services for at least 2 midnights  Estimated Length of Stay: 2          B Medical/Surgery History Past Medical History:  Diagnosis Date   Anxiety    Arthritis    Attention deficit disorder (ADD)    COPD (chronic obstructive pulmonary disease) (HCC)    Depression    Drug abuse (HCC)    Fibromyalgia    HFrEF (heart failure with reduced ejection fraction) (HCC)    Hyperlipidemia    Paroxysmal Atrial fibrillation    Past Surgical History:  Procedure Laterality Date   ANKLE SURGERY     FACIAL COSMETIC SURGERY        A IV Location/Drains/Wounds Patient Lines/Drains/Airways Status     Active Line/Drains/Airways     Name Placement date Placement time Site Days   Peripheral IV 02/17/23 22 G Anterior;Right Forearm 02/17/23  0819  Forearm  less than 1            Intake/Output Last 24 hours No intake or output data in the 24 hours ending 02/17/23 1256  Labs/Imaging Results for orders placed or performed during the hospital encounter of 02/17/23 (from the past 48 hour(s))  Basic metabolic panel     Status: Abnormal   Collection Time: 02/17/23  8:45 AM  Result Value Ref Range   Sodium 135 135 - 145 mmol/L   Potassium 3.8 3.5 - 5.1 mmol/L   Chloride 103 98 - 111 mmol/L   CO2 23 22 - 32 mmol/L   Glucose, Bld 116 (H) 70 - 99 mg/dL    Comment: Glucose reference range applies only to samples taken after fasting for at least 8 hours.   BUN 8 8 - 23 mg/dL   Creatinine, Ser 8.65 0.44 - 1.00 mg/dL   Calcium 8.6 (L) 8.9 - 10.3 mg/dL   GFR, Estimated >78 >46 mL/min    Comment: (NOTE) Calculated using the CKD-EPI Creatinine Equation (2021)    Anion gap 9 5 - 15    Comment: Performed at [facility], [address] [place], [place] [address]  CBC with Differential  Status: Abnormal   Collection Time: 02/17/23  8:45 AM  Result Value Ref Range   WBC 9.8 4.0 - 10.5 K/uL   RBC 3.79 (L) 3.87 - 5.11 MIL/uL   Hemoglobin 10.5 (L) 12.0 - 15.0 g/dL   HCT 86.5 (L) 78.4 - 69.6 %   MCV 89.4 80.0 - 100.0 fL   MCH 27.7 26.0 - 34.0 pg   MCHC 31.0 30.0 - 36.0 g/dL   RDW 29.5 (H) 28.4 - 13.2 %   Platelets 303 150 - 400 K/uL   nRBC 0.0 0.0 - 0.2 %   Neutrophils Relative % 75 %   Neutro Abs 7.2 1.7 - 7.7 K/uL   Lymphocytes Relative 19 %   Lymphs Abs 1.9 0.7 - 4.0 K/uL   Monocytes Relative 5 %   Monocytes Absolute 0.5 0.1 - 1.0 K/uL   Eosinophils Relative 1 %   Eosinophils Absolute 0.1 0.0 - 0.5 K/uL   Basophils Relative 0 %   Basophils Absolute 0.0 0.0 - 0.1 K/uL   Immature Granulocytes 0 %    Abs Immature Granulocytes 0.03 0.00 - 0.07 K/uL    Comment: Performed at [facility], [address] [place], [place] [address]  Brain natriuretic peptide     Status: Abnormal   Collection Time: 02/17/23  8:45 AM  Result Value Ref Range   B Natriuretic Peptide 1,231.2 (H) 0.0 - 100.0 pg/mL    Comment: Performed at [facility], [address], [place], [place] [address]  Troponin I (High Sensitivity)     Status: None   Collection Time: 02/17/23  8:45 AM  Result Value Ref Range   Troponin I (High Sensitivity) 17 <18 ng/L    Comment: (NOTE) Elevated high sensitivity troponin I (hsTnI) values and significant  changes across serial measurements may suggest ACS but many other  chronic and acute conditions are known to elevate hsTnI results.  Refer to the "Links" section for chest pain algorithms and additional  guidance. Performed at [facility], [address] [place], [place] [address]   Magnesium     Status: None   Collection Time: 02/17/23  8:45 AM  Result Value Ref Range   Magnesium 1.8 1.7 - 2.4 mg/dL    Comment: Performed at [facility], [address] [place], [place] [address]  I-Stat venous blood gas, ED     Status: Abnormal   Collection Time: 02/17/23 11:44 AM  Result Value Ref Range   pH, Ven 7.437 (H) 7.25 - 7.43   pCO2, Ven 37.7 (L) 44 - 60 mmHg   pO2, Ven 39 32 - 45 mmHg   Bicarbonate 25.4 20.0 - 28.0 mmol/L   TCO2 27 22 - 32 mmol/L   O2 Saturation 76 %   Acid-Base Excess 1.0 0.0 - 2.0 mmol/L   Sodium 135 135 - 145 mmol/L   Potassium 4.1 3.5 - 5.1 mmol/L   Calcium, Ion 1.02 (L) 1.15 - 1.40 mmol/L   HCT 34.0 (L) 36.0 - 46.0 %   Hemoglobin 11.6 (L) 12.0 - 15.0 g/dL   Sample type VENOUS    Comment NOTIFIED PHYSICIAN    DG Chest Port 1 View  Result Date: 02/17/2023 CLINICAL DATA:  sob EXAM: PORTABLE CHEST 1 VIEW COMPARISON:  01/18/2023 FINDINGS: Stable cardiomegaly. Aortic atherosclerosis. Pulmonary vascular congestion with bilateral  interstitial opacities, most pronounced in the perihilar and bibasilar regions. Small bilateral pleural effusions, right greater than left. No pneumothorax. IMPRESSION: Findings of CHF  with pulmonary edema and small bilateral pleural effusions. Electronically Signed   By: Duanne Guess D.O.   On: 02/17/2023 08:44    Pending Labs Unresulted Labs (From admission, onward)     Start     Ordered   02/18/23 0500  Basic metabolic panel  Tomorrow morning,   R        02/17/23 1212   02/18/23 0500  CBC  Tomorrow morning,   R        02/17/23 1212            Vitals/Pain Today's Vitals   02/17/23 1200 02/17/23 1230 02/17/23 1238 02/17/23 1239  BP: (!) 128/108  (!) 146/94   Pulse:  (!) 116    Resp: 17 (!) 22 (!) 28   Temp: 98 F (36.7 C)   97.9 F (36.6 C)  TempSrc:    Oral  SpO2: 98% 94% 97%   Weight: 99.8 kg       Isolation Precautions No active isolations  Medications Medications  magnesium sulfate IVPB 2 g 50 mL (2 g Intravenous New Bag/Given 02/17/23 1228)  enoxaparin (LOVENOX) injection 40 mg (40 mg Subcutaneous Given 02/17/23 1229)  acetaminophen (TYLENOL) tablet 650 mg (has no administration in time range)    Or  acetaminophen (TYLENOL) suppository 650 mg (has no administration in time range)  senna-docusate (Senokot-S) tablet 1 tablet (has no administration in time range)  furosemide (LASIX) injection 40 mg (40 mg Intravenous Given 02/17/23 0845)    Mobility non-ambulatory     Focused Assessments    R Recommendations: See Admitting Provider Note  Report given to:   Additional Notes:

## 2023-02-17 NOTE — ED Notes (Signed)
ED TO INPATIENT HANDOFF REPORT  ED Nurse Name and Phone #:  Theadora Rama RN   S Name/Age/Gender Autumn [name] 67 y.o. female Room/Bed: 033C/033C  Code Status   Code Status: Full Code  Home/SNF/Other Home Patient oriented to: self, place, time, and situation Is this baseline? Yes   Triage Complete: Triage complete  Chief Complaint Acute hypoxic respiratory failure (HCC) [J96.01]  Triage Note Pt BIB [facility] from home d/t sob that has been going on for couple of weeks. Pt has been off of lasix for awhile instructed by her PCP. Hx AFIB. Wears 4 L at baseline. A&O X4. ALSO C/O CP w exertion. VSS.    Allergies Allergies  Allergen Reactions   Pamelor [Nortriptyline Hcl] Anaphylaxis   Vibra-Tab [Doxycycline] Other (See Comments)    Unknown reaction    Level of Care/Admitting Diagnosis ED Disposition     ED Disposition  Admit   Condition  --   Comment  Hospital Area: [facility] [[id number]]  Level of Care: Progressive [102]  Admit to Progressive based on following criteria: RESPIRATORY PROBLEMS hypoxemic/hypercapnic respiratory failure that is responsive to NIPPV (BiPAP) or High Flow Nasal Cannula (6-80 lpm). Frequent assessment/intervention, no > Q2 hrs < Q4 hrs, to maintain oxygenation and pulmonary hygiene.  May admit patient to [facility] or [facility] if equivalent level of care is available:: Yes  Covid Evaluation: Asymptomatic - no recent exposure (last 10 days) testing not required  Diagnosis: Acute hypoxic respiratory failure [facility]) [[id number]]  Admitting Physician: Tyson Alias [[id number]]  Attending Physician: Tyson Alias [phone]  Certification:: I certify this patient will need inpatient services for at least 2 midnights          B Medical/Surgery History Past Medical History:  Diagnosis Date   Anxiety    Arthritis    Attention deficit disorder (ADD)    COPD (chronic obstructive pulmonary disease) (HCC)    Depression     Drug abuse (HCC)    Fibromyalgia    HFrEF (heart failure with reduced ejection fraction) (HCC)    Hyperlipidemia    Paroxysmal Atrial fibrillation    Past Surgical History:  Procedure Laterality Date   ANKLE SURGERY     FACIAL COSMETIC SURGERY       A IV Location/Drains/Wounds Patient Lines/Drains/Airways Status     Active Line/Drains/Airways     Name Placement date Placement time Site Days   Peripheral IV 02/17/23 22 G Anterior;Right Forearm 02/17/23  0819  Forearm  less than 1            Intake/Output Last 24 hours  Intake/Output Summary (Last 24 hours) at 02/17/2023 2120 Last data filed at 02/17/2023 2032 Gross per 24 hour  Intake --  Output 3550 ml  Net -3550 ml    Labs/Imaging Results for orders placed or performed during the hospital encounter of 02/17/23 (from the past 48 hour(s))  Basic metabolic panel     Status: Abnormal   Collection Time: 02/17/23  8:45 AM  Result Value Ref Range   Sodium 135 135 - 145 mmol/L   Potassium 3.8 3.5 - 5.1 mmol/L   Chloride 103 98 - 111 mmol/L   CO2 23 22 - 32 mmol/L   Glucose, Bld 116 (H) 70 - 99 mg/dL    Comment: Glucose reference range applies only to samples taken after fasting for at least 8 hours.   BUN 8 8 - 23 mg/dL   Creatinine, Ser 7.84 0.44 - 1.00 mg/dL  Calcium 8.6 (L) 8.9 - 10.3 mg/dL   GFR, Estimated >40 >98 mL/min    Comment: (NOTE) Calculated using the CKD-EPI Creatinine Equation (2021)    Anion gap 9 5 - 15    Comment: Performed at [facility], [address], [place], [place] [address]  CBC with Differential     Status: Abnormal   Collection Time: 02/17/23  8:45 AM  Result Value Ref Range   WBC 9.8 4.0 - 10.5 K/uL   RBC 3.79 (L) 3.87 - 5.11 MIL/uL   Hemoglobin 10.5 (L) 12.0 - 15.0 g/dL   HCT 78.2 (L) 95.6 - 21.3 %   MCV 89.4 80.0 - 100.0 fL   MCH 27.7 26.0 - 34.0 pg   MCHC 31.0 30.0 - 36.0 g/dL   RDW 08.6 (H) 57.8 - 46.9 %   Platelets 303 150 - 400 K/uL   nRBC 0.0 0.0 - 0.2 %    Neutrophils Relative % 75 %   Neutro Abs 7.2 1.7 - 7.7 K/uL   Lymphocytes Relative 19 %   Lymphs Abs 1.9 0.7 - 4.0 K/uL   Monocytes Relative 5 %   Monocytes Absolute 0.5 0.1 - 1.0 K/uL   Eosinophils Relative 1 %   Eosinophils Absolute 0.1 0.0 - 0.5 K/uL   Basophils Relative 0 %   Basophils Absolute 0.0 0.0 - 0.1 K/uL   Immature Granulocytes 0 %   Abs Immature Granulocytes 0.03 0.00 - 0.07 K/uL    Comment: Performed at [facility], [address] [place], [place] [address]  Brain natriuretic peptide     Status: Abnormal   Collection Time: 02/17/23  8:45 AM  Result Value Ref Range   B Natriuretic Peptide 1,231.2 (H) 0.0 - 100.0 pg/mL    Comment: Performed at [facility], [address] [place], [place] [address]  Troponin I (High Sensitivity)     Status: None   Collection Time: 02/17/23  8:45 AM  Result Value Ref Range   Troponin I (High Sensitivity) 17 <18 ng/L    Comment: (NOTE) Elevated high sensitivity troponin I (hsTnI) values and significant  changes across serial measurements may suggest ACS but many other  chronic and acute conditions are known to elevate hsTnI results.  Refer to the "Links" section for chest pain algorithms and additional  guidance. Performed at [facility], [address], [place], [place] [address]   Magnesium     Status: None   Collection Time: 02/17/23  8:45 AM  Result Value Ref Range   Magnesium 1.8 1.7 - 2.4 mg/dL    Comment: Performed at [facility], [address], [place], [place] [address]  Troponin I (High Sensitivity)     Status: None   Collection Time: 02/17/23 10:22 AM  Result Value Ref Range   Troponin I (High Sensitivity) 15 <18 ng/L    Comment: (NOTE) Elevated high sensitivity troponin I (hsTnI) values and significant  changes across serial measurements may suggest ACS but many other  chronic and acute conditions are known to elevate hsTnI results.  Refer to the "Links" section for chest pain algorithms  and additional  guidance. Performed at [facility], [address], [place], [place] [address]   I-Stat venous blood gas, ED     Status: Abnormal   Collection Time: 02/17/23 11:44 AM  Result Value Ref Range   pH, Ven 7.437 (H) 7.25 - 7.43   pCO2, Ven 37.7 (L) 44 -  60 mmHg   pO2, Ven 39 32 - 45 mmHg   Bicarbonate 25.4 20.0 - 28.0 mmol/L   TCO2 27 22 - 32 mmol/L   O2 Saturation 76 %   Acid-Base Excess 1.0 0.0 - 2.0 mmol/L   Sodium 135 135 - 145 mmol/L   Potassium 4.1 3.5 - 5.1 mmol/L   Calcium, Ion 1.02 (L) 1.15 - 1.40 mmol/L   HCT 34.0 (L) 36.0 - 46.0 %   Hemoglobin 11.6 (L) 12.0 - 15.0 g/dL   Sample type VENOUS    Comment NOTIFIED PHYSICIAN   I-Stat arterial blood gas, ED     Status: Abnormal   Collection Time: 02/17/23  2:15 PM  Result Value Ref Range   pH, Arterial 7.361 7.35 - 7.45   pCO2 arterial 47.9 32 - 48 mmHg   pO2, Arterial 82 (L) 83 - 108 mmHg   Bicarbonate 27.2 20.0 - 28.0 mmol/L   TCO2 29 22 - 32 mmol/L   O2 Saturation 95 %   Acid-Base Excess 1.0 0.0 - 2.0 mmol/L   Sodium 135 135 - 145 mmol/L   Potassium 3.7 3.5 - 5.1 mmol/L   Calcium, Ion 1.20 1.15 - 1.40 mmol/L   HCT 35.0 (L) 36.0 - 46.0 %   Hemoglobin 11.9 (L) 12.0 - 15.0 g/dL   Sample type ARTERIAL   Heparin level (unfractionated)     Status: None   Collection Time: 02/17/23  7:40 PM  Result Value Ref Range   Heparin Unfractionated 0.32 0.30 - 0.70 IU/mL    Comment: (NOTE) The clinical reportable range upper limit is being lowered to >1.10 to align with the FDA approved guidance for the current laboratory assay.  If heparin results are below expected values, and patient dosage has  been confirmed, suggest follow up testing of antithrombin III levels. Performed at [facility], [address], [place], [place] [address]   APTT     Status: None   Collection Time: 02/17/23  7:40 PM  Result Value Ref Range   aPTT 28 24 - 36 seconds    Comment: Performed at [facility], [address], [place], [place] [address]   DG Chest Port 1 View  Result Date: 02/17/2023 CLINICAL DATA:  sob EXAM: PORTABLE CHEST 1 VIEW COMPARISON:  01/18/2023 FINDINGS: Stable cardiomegaly. Aortic atherosclerosis. Pulmonary vascular congestion with bilateral interstitial opacities, most pronounced in the perihilar and bibasilar regions. Small bilateral pleural effusions, right greater than left. No pneumothorax. IMPRESSION: Findings of CHF with pulmonary edema and small bilateral pleural effusions. Electronically Signed   By: Duanne Guess D.O.   On: 02/17/2023 08:44    Pending Labs Unresulted Labs (From admission, onward)     Start     Ordered   02/18/23 0500  Basic metabolic panel  Tomorrow morning,   R        02/17/23 1212   02/18/23 0500  CBC  Tomorrow morning,   R        02/17/23 1212   02/17/23 1402  Blood gas, arterial  Once,   R        02/17/23 1402            Vitals/Pain Today's Vitals   02/17/23 1500 02/17/23 1615 02/17/23 1930 02/17/23 2046  BP: (!) 133/103 (!) 135/97 (!) 129/90   Pulse: (!) 53 (!) 47 (!) 126   Resp:  19 20   Temp:   98 F (36.7 C)   TempSrc:  SpO2: 100% 99% 93% 97%  Weight:        Isolation Precautions No active isolations  Medications Medications  acetaminophen (TYLENOL) tablet 650 mg (has no administration in time range)    Or  acetaminophen (TYLENOL) suppository 650 mg (has no administration in time range)  senna-docusate (Senokot-S) tablet 1 tablet (has no administration in time range)  ipratropium-albuterol (DUONEB) 0.5-2.5 (3) MG/3ML nebulizer solution 3 mL (3 mLs Nebulization Given 02/17/23 2044)  budesonide (PULMICORT) nebulizer solution 0.5 mg (0.5 mg Nebulization Not Given 02/17/23 2044)  metoprolol tartrate (LOPRESSOR) injection 2.5 mg (2.5 mg Intravenous Given 02/17/23 2032)  furosemide (LASIX) injection 40 mg (has no administration in time range)  enoxaparin (LOVENOX) injection 100 mg (has no administration in time range)   furosemide (LASIX) injection 40 mg (40 mg Intravenous Given 02/17/23 0845)  magnesium sulfate IVPB 2 g 50 mL (0 g Intravenous Stopped 02/17/23 1336)  methylPREDNISolone sodium succinate (SOLU-MEDROL) 40 mg/mL injection 40 mg (40 mg Intravenous Given 02/17/23 1633)  furosemide (LASIX) injection 60 mg (60 mg Intravenous Given 02/17/23 1633)    Mobility walks with device     Focused Assessments Cardiac Assessment Handoff:  Cardiac Rhythm: Atrial fibrillation (with RVR) Lab Results  Component Value Date   TROPONINI <0.03 06/27/2017   No results found for: "DDIMER" Does the Patient currently have chest pain? No   , Neuro Assessment Handoff:  Swallow screen pass? Yes  Cardiac Rhythm: Atrial fibrillation (with RVR)       Neuro Assessment:   Neuro Checks:      Has TPA been given? No If patient is a Neuro Trauma and patient is going to OR before floor call report to 4N Charge nurse: [phone] or [phone]  , Pulmonary Assessment Handoff:  Lung sounds: Bilateral Breath Sounds: Diminished, Fine crackles O2 Device: Nasal Cannula O2 Flow Rate (L/min): 2 L/min    R Recommendations: See Admitting Provider Note  Report given to:   Additional Notes:

## 2023-02-17 NOTE — H&P (Addendum)
Date: 02/17/2023               Patient Name:  Autumn [name] MRN: [id number]  DOB: 1956/06/16 Age / Sex: 67 y.o., female   PCP: Vivien Presto, MD         Medical Service: Internal Medicine Teaching Service         Attending Physician: Dr. Oswaldo Done, Marquita Palms, *    First Contact: Dr. Rayvon Char Pager: [phone]  Second Contact: Dr. Ned Card Pager: [phone]       After Hours (After 5p/  First Contact Pager: [phone]  weekends / holidays): Second Contact Pager: [phone]   Chief Complaint: Shortness of breath  History of Present Illness:  Pt is a 67 year old female with PMHx of HFrEF (EF 35%), A fib with RVR, chronic hypoxic respiratory failure, T2DM, polysubstance use presenting to the ED with shortness of breath and worsening lower extremity edema.   Pt was noted to be somnolent upon my evaluation, and discussion with nurse showed that pt became sleepy after being placed on BiPAP. Pt did respond to questions but answers were limited.   She stated she did not take her medications as they were confusing. She denied any chest pain, abdominal pain or headaches.   Review of Systems negative unless stated in the HPI.  In the ED, she was noted to be hypoxic and placed on BiPAP with symptomatic relief. She was given one dose of IV lasix 40 mg. Lab work and CXR was obtained and IMTS was consulted for admission. Given pt was more somnolent upon my arrival, I ordered a VBG which showed no acidosis and normal bicarb.   Past Medical History: Past Medical History:  Diagnosis Date   Anxiety    Arthritis    Attention deficit disorder (ADD)    COPD (chronic obstructive pulmonary disease) (HCC)    Depression    Drug abuse (HCC)    Fibromyalgia    HFrEF (heart failure with reduced ejection fraction) (HCC)    Hyperlipidemia    Paroxysmal Atrial fibrillation     Meds: Unable to verify exact dosing, will appreciate pharmacy assistance and await further med reconciliation.  Current  Outpatient Medications  Medication Instructions   albuterol (ACCUNEB) 0.63 MG/3ML nebulizer solution 1 ampule, Nebulization, Every 6 hours PRN   Albuterol Sulfate, sensor, 108 (90 Base) MCG/ACT AEPB 2 puffs, Inhalation, Every 6 hours PRN   ARIPiprazole (ABILIFY) 10 mg, Oral, Daily   atorvastatin (LIPITOR) 80 mg, Oral, Daily   Budeson-Glycopyrrol-Formoterol (BREZTRI AEROSPHERE) 160-9-4.8 MCG/ACT AERO 2 puffs, Inhalation, 2 times daily   Buprenorphine HCl-Naloxone HCl (SUBOXONE) 8-2 MG FILM 1 Film, Sublingual, 3 times daily   busPIRone (BUSPAR) 10 mg, Oral, 2 times daily   carbidopa-levodopa (SINEMET IR) 25-100 MG tablet 1 tablet, Oral, 3 times daily   dapagliflozin propanediol (FARXIGA) 10 mg, Oral, Daily   diclofenac Sodium (VOLTAREN) 2-4 g, Topical, 4 times daily PRN   DULoxetine (CYMBALTA) 60 mg, Oral, Daily   fluticasone (FLONASE) 50 MCG/ACT nasal spray 2 sprays, Each Nare, Daily PRN   furosemide (LASIX) 40 mg, Oral, Daily   gabapentin (NEURONTIN) 600 mg, Oral, 3 times daily   guaiFENesin-dextromethorphan (ROBITUSSIN DM) 100-10 MG/5ML syrup 5 mLs, Oral, Every 6 hours PRN   melatonin 3 mg, Oral, At bedtime PRN   metoprolol succinate (TOPROL-XL) 150 mg, Oral, Daily, Take with or immediately following a meal.   Multiple Vitamin (MULTIVITAMIN WITH MINERALS) TABS 1 tablet, Oral, Every morning  naloxone (NARCAN) 4 mg, Nasal, As needed   pantoprazole (PROTONIX) 40 mg, Oral, Daily   Xarelto 20 mg, Oral, Daily    Allergies: Allergies as of 02/17/2023 - Review Complete 02/17/2023  Allergen Reaction Noted   Pamelor [nortriptyline hcl] Anaphylaxis 11/07/2018   Vibra-tab [doxycycline] Other (See Comments)     Past Surgical History: Unable to verify Past Surgical History:  Procedure Laterality Date   ANKLE SURGERY     FACIAL COSMETIC SURGERY      Family History:  Unable to obtain  Social History: Unable to verify Social History   Socioeconomic History   Marital status: Divorced     Spouse name: Not on file   Number of children: 3   Years of education: Not on file   Highest education level: High school graduate  Occupational History   Occupation: disabled  Tobacco Use   Smoking status: Every Day    Packs/day: 0.50    Years: 20.00    Additional pack years: 0.00    Total pack years: 10.00    Types: Cigarettes   Smokeless tobacco: Never   Tobacco comments:    Half pack daily going to try patches  Vaping Use   Vaping Use: Every day   Substances: Nicotine, Flavoring  Substance and Sexual Activity   Alcohol use: Not Currently    Comment: A fifth of vodka a day - none in a year   Drug use: Yes    Types: Cocaine, Marijuana    Comment: uses marijuana regularly; used cocaine " a long time ago"   Sexual activity: Not on file  Other Topics Concern   Not on file  Social History Narrative   Not on file   Social Determinants of Health   Financial Resource Strain: Low Risk  (01/21/2023)   Overall Financial Resource Strain (CARDIA)    Difficulty of Paying Living Expenses: Not very hard  Food Insecurity: No Food Insecurity (01/21/2023)   Hunger Vital Sign    Worried About Running Out of Food in the Last Year: Never true    Ran Out of Food in the Last Year: Never true  Transportation Needs: No Transportation Needs (01/21/2023)   PRAPARE - Administrator, Civil Service (Medical): No    Lack of Transportation (Non-Medical): No  Physical Activity: Not on file  Stress: Not on file  Social Connections: Not on file  Intimate Partner Violence: Not At Risk (12/04/2022)   Humiliation, Afraid, Rape, and Kick questionnaire    Fear of Current or Ex-Partner: No    Emotionally Abused: No    Physically Abused: No    Sexually Abused: No     Physical Exam: Blood pressure (!) 128/108, pulse (!) 116, temperature 98 F (36.7 C), resp. rate (!) 22, weight 99.8 kg, SpO2 94 %. General: Somnolent but following commands HENT: On Bipap, NCAT Lungs: CTAB Cardiovascular:  NSR, radial pulses palpable but pedal pulses nonpalpable due to edema (present on doppler), No JVD, 2+ lower extremity edema  Abdomen: No TTP, normal bowel sounds MSK: no asymmetry Skin: no skin lesions on exposed skin Neuro: somnolent but oriented x4 Psych: normal mood  Diagnostics:     Latest Ref Rng & Units 02/17/2023   11:44 AM 02/17/2023    8:45 AM 01/19/2023    2:40 AM  CBC  WBC 4.0 - 10.5 K/uL  9.8  9.9   Hemoglobin 12.0 - 15.0 g/dL 47.8  29.5  62.1   Hematocrit 36.0 - 46.0 % 34.0  33.9  33.8   Platelets 150 - 400 K/uL  303  252        Latest Ref Rng & Units 02/17/2023   11:44 AM 02/17/2023    8:45 AM 01/20/2023   11:39 PM  CMP  Glucose 70 - 99 mg/dL  161  91   BUN 8 - 23 mg/dL  8  24   Creatinine 0.96 - 1.00 mg/dL  0.45  4.09   Sodium 811 - 145 mmol/L 135  135  137   Potassium 3.5 - 5.1 mmol/L 4.1  3.8  4.1   Chloride 98 - 111 mmol/L  103  97   CO2 22 - 32 mmol/L  23  32   Calcium 8.9 - 10.3 mg/dL  8.6  8.6     DG Chest Port 1 View  Result Date: 02/17/2023 CLINICAL DATA:  sob EXAM: PORTABLE CHEST 1 VIEW COMPARISON:  01/18/2023 FINDINGS: Stable cardiomegaly. Aortic atherosclerosis. Pulmonary vascular congestion with bilateral interstitial opacities, most pronounced in the perihilar and bibasilar regions. Small bilateral pleural effusions, right greater than left. No pneumothorax. IMPRESSION: Findings of CHF with pulmonary edema and small bilateral pleural effusions. Electronically Signed   By: Duanne Guess D.O.   On: 02/17/2023 08:44     EKG: personally reviewed my interpretation is afib with RVR  CXR: personally reviewed my interpretation is bilateral pulmonary edema  Assessment & Plan by Problem:  Present on Admission:  Acute hypoxic respiratory failure (HCC)   Acute on chronic hypoxic respiratory failure HFrEF COPD Patient with baseline requirement of 2 L presented to the ED with dyspnea and lower extremity edema.  Most likely etiology of her symptoms is  acute heart exacerbation secondary to nonadherence to her medications.  COPD exacerbation is another possible etiology given her history of COPD and her have not 2 out of 3 cardinal symptoms of COPD exacerbation.  Given that patient is presently on BiPAP we will add steroids to her treatment plan.  Patient has received 1 dose of IV Lasix this a.m.  Will give another dose of IV Lasix and monitor urine output.  Patient does not appear to have measured weight here and we will attempt to get a weight.  Finding supporting heart exacerbation include significantly elevated BNP, chest x-ray suggestive of pulmonary edema and patient's nonadherence to her regimen.  ACS was ruled out with negative troponin.  PE was considered but her Wells score places her at a low risk group and patient is on anticoagulation.  Will treat patient with IV diuretics and give a dose of IV steroids and switch her over to oral steroids and possible. ABG and VBG reassuring.  -Admit to progressive. Continue BiPAP, wean as able. Keep NPO for now given on BiPAP and somnolence.  -IV Lasix 60 mg orderd.  Will redose based on volume exam and urine output -IV Solu-Medrol, start prednisone when able. Will start duo nebs, and budesonide nebs while pt is here. Can restart home inhalers once pt is able.  -Strict input and output, daily weight -Replete potassium and magnesium to goal of 4 and 2. Magnesium 2 grams ordered.  -Daily BMP, Magnesium  A fib with RVR Appears secondary to her respiratory distress and could be contributory.  Patient's heart rate around 110 but does increase to 120s.  Given her low EF and other findings including prolonged QTc we will have cardiology assist with her A-fib with RVR.  Patient's home regimen includes Toprol 150 mg daily and Eliquis. -Cardiology consulted,  appreciate their assistance.  Chronic Problems: T2DM: A1c 6.1 in 10/2022. Needs OP repeat A1c. Hold home meds: Farxiga 5 mg every day, , start SSI.   Parkinson's Disease: Will hold home carbidopa-levodopa until pt is off of BiPAP.  CAD s/p Stents: Hold statin for know. Will restart when able.  MDD: Home abilify was held. Will restart when able.  Chronic Hep C Infection: Will need OP follow up with Dr. Ninetta Lights.   DVT prophx: Lovenox Diet:NPO Bowel: PRN Code:Full  Prior to Admission Living Arrangement: Home Anticipated Discharge Location: Home Barriers to Discharge: Medical Workup  Dispo: Admit patient to Inpatient with expected length of stay greater than 2 midnights.  Gwenevere Abbot, MD Eligha Bridegroom. [facility] Internal Medicine Residency, PGY-3 Pager: [phone]
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
## 2023-02-17 NOTE — Progress Notes (Addendum)
ANTICOAGULATION CONSULT NOTE - Initial Consult  Pharmacy Consult for Enoxaparin Indication: atrial fibrillation  Allergies  Allergen Reactions   Pamelor [Nortriptyline Hcl] Anaphylaxis   Vibra-Tab [Doxycycline] Other (See Comments)    Unknown reaction    Patient Measurements: Weight: 99.8 kg (220 lb 0.3 oz)  Vital Signs: Temp: 98 F (36.7 C) (07/10 1930) Temp Source: Oral (07/10 1239) BP: 139/109 (07/10 2117) Pulse Rate: 126 (07/10 1930)  Labs: Recent Labs    02/17/23 0845 02/17/23 1022 02/17/23 1144 02/17/23 1415 02/17/23 1940  HGB 10.5*  --  11.6* 11.9*  --   HCT 33.9*  --  34.0* 35.0*  --   PLT 303  --   --   --   --   APTT  --   --   --   --  28  HEPARINUNFRC  --   --   --   --  0.32  CREATININE 0.81  --   --   --   --   TROPONINIHS 17 15  --   --   --     Estimated Creatinine Clearance: 79.5 mL/min (by C-G formula based on SCr of 0.81 mg/dL).   Medical History: Past Medical History:  Diagnosis Date   Anxiety    Arthritis    Attention deficit disorder (ADD)    COPD (chronic obstructive pulmonary disease) (HCC)    Depression    Drug abuse (HCC)    Fibromyalgia    HFrEF (heart failure with reduced ejection fraction) (HCC)    Hyperlipidemia    Paroxysmal Atrial fibrillation    Assessment: 6 yof with a history of CAD, AF, COPD, PAD, Parkinson's, substance abuse, HTN, chronic hep C. Patient being followed by cardiology for AF. Lovenox per pharmacy consult placed for the same.  Patient is on anticoagulation prior to arrival. Chart review reveals fill history of both apixaban and xarelto. The apixaban last filled in April and Xarelto last filled in June. Patient states taking eliquis not xarelto. States last taken 7/9.  Baseline coags were evaluated in hopes for answers for DOAC compliance. Heparin level is slightly elevated at 0.32. HOWEVER, patient received ppx enoxaparin dose this afternoon. No conclusions can be made.  Goal of Therapy:  Monitor  platelets by anticoagulation protocol: Yes   Plan:  Enoxaparin 100mg  q12h will start in AM given uncertain last DOAC dose Monitor for s/s of hemorrhage F/u cardiology plan for anticoagulation moving forward  Delmar Landau, PharmD, BCPS 02/17/2023 9:41 PM ED Clinical Pharmacist -  [phone]

## 2023-02-17 NOTE — ED Notes (Signed)
Unable to get patient blood. 

## 2023-02-17 NOTE — ED Notes (Signed)
Pt's HR is still between 110-130s. MD made aware who stated will add some meds in.

## 2023-02-17 NOTE — ED Notes (Signed)
Pt transferred to yellow. Pt drowsy, pt's O2 upper 80% on RA. RN placed pt on 3L [place]. O2 came up to upper 90% on 3L [place]. Pt in a fib RVR with HR between 110-130. RN notified MD and Consulting civil engineer.

## 2023-02-17 NOTE — ED Triage Notes (Signed)
Pt BIB [facility] from home d/t sob that has been going on for couple of weeks. Pt has been off of lasix for awhile instructed by her PCP. Hx AFIB. Wears 4 L at baseline. A&O X4. ALSO C/O CP w exertion. VSS.

## 2023-02-17 NOTE — Progress Notes (Signed)
Pt's WOB decreased with initiation of bipap. RT will continue to monitor.

## 2023-02-17 NOTE — ED Notes (Signed)
Pt woke up, pulled off bipap and started cussing at the nursing staff. RT aware and came put the pt back on bipap. MD made aware.

## 2023-02-18 ENCOUNTER — Encounter: Payer: Self-pay | Admitting: General Practice

## 2023-02-18 ENCOUNTER — Ambulatory Visit: Payer: 59 | Attending: General Practice | Admitting: General Practice

## 2023-02-18 LAB — BASIC METABOLIC PANEL
Anion gap: 10 (ref 5–15)
BUN: 8 mg/dL (ref 8–23)
CO2: 25 mmol/L (ref 22–32)
Calcium: 8.9 mg/dL (ref 8.9–10.3)
Chloride: 100 mmol/L (ref 98–111)
Creatinine, Ser: 0.65 mg/dL (ref 0.44–1.00)
GFR, Estimated: >60 mL/min (ref >60)
Glucose, Bld: 128 mg/dL — ABNORMAL HIGH (ref 70–99)
Potassium: 4.1 mmol/L (ref 3.5–5.1)
Sodium: 135 mmol/L (ref 135–145)

## 2023-02-18 LAB — CBC
HCT: 38.3 % (ref 36.0–46.0)
Hemoglobin: 11.7 g/dL — ABNORMAL LOW (ref 12.0–15.0)
MCH: 26.7 pg (ref 26.0–34.0)
MCHC: 30.5 g/dL (ref 30.0–36.0)
MCV: 87.2 fL (ref 80.0–100.0)
Platelets: 331 10*3/uL (ref 150–400)
RBC: 4.39 MIL/uL (ref 3.87–5.11)
RDW: 17 % — ABNORMAL HIGH (ref 11.5–15.5)
WBC: 8.8 10*3/uL (ref 4.0–10.5)
nRBC: 0 % (ref 0.0–0.2)

## 2023-02-18 MED ORDER — BUPRENORPHINE HCL-NALOXONE HCL 8-2 MG SL SUBL
1.0000 | SUBLINGUAL_TABLET | Freq: Three times a day (TID) | SUBLINGUAL | Status: DC
  Administered 2023-02-18 – 2023-02-21 (×5): 1 via SUBLINGUAL
  Filled 2023-02-18 (×11): qty 1

## 2023-02-18 MED ORDER — IPRATROPIUM BROMIDE 0.02 % IN SOLN
0.5000 mg | Freq: Four times a day (QID) | RESPIRATORY_TRACT | Status: DC
  Administered 2023-02-18 – 2023-02-22 (×14): 0.5 mg via RESPIRATORY_TRACT
  Filled 2023-02-18 (×15): qty 2.5

## 2023-02-18 MED ORDER — METOPROLOL SUCCINATE ER 50 MG PO TB24
150.0000 mg | ORAL_TABLET | Freq: Every day | ORAL | Status: DC
  Administered 2023-02-18 – 2023-02-19 (×2): 150 mg via ORAL
  Filled 2023-02-18 (×2): qty 1

## 2023-02-18 MED ORDER — RIVAROXABAN 20 MG PO TABS
20.0000 mg | ORAL_TABLET | Freq: Every day | ORAL | Status: DC
  Administered 2023-02-18 – 2023-02-21 (×4): 20 mg via ORAL
  Filled 2023-02-18 (×4): qty 1

## 2023-02-18 MED ORDER — LOSARTAN POTASSIUM 25 MG PO TABS
25.0000 mg | ORAL_TABLET | Freq: Every day | ORAL | Status: DC
  Administered 2023-02-18 – 2023-02-22 (×5): 25 mg via ORAL
  Filled 2023-02-18 (×5): qty 1

## 2023-02-18 MED ORDER — HYDROXYZINE HCL 25 MG PO TABS
25.0000 mg | ORAL_TABLET | Freq: Three times a day (TID) | ORAL | Status: DC | PRN
  Administered 2023-02-18 – 2023-02-19 (×3): 25 mg via ORAL
  Filled 2023-02-18 (×3): qty 1

## 2023-02-18 MED ORDER — PREDNISONE 20 MG PO TABS
40.0000 mg | ORAL_TABLET | Freq: Every day | ORAL | Status: AC
  Administered 2023-02-18 – 2023-02-21 (×4): 40 mg via ORAL
  Filled 2023-02-18 (×4): qty 2

## 2023-02-18 NOTE — Progress Notes (Addendum)
Patient Name: Autumn [name] Date of Encounter: 02/18/2023 [facility] Cardiologist: Thomasene Ripple, DO   Interval Summary  .    Patient clinically improved this morning. No longer requiring BiPAP, is back on home O2 flow. Denies chest pain or dyspnea at rest today.   Vital Signs .    Vitals:   02/18/23 0751 02/18/23 0757 02/18/23 0800 02/18/23 0813  BP:  (!) 135/122 (!) 133/107   Pulse:  63  (!) 131  Resp:  18 20   Temp:  98.6 F (37 C)    TempSrc:  Oral    SpO2: 100% 100% 100%   Weight:      Height:        Intake/Output Summary (Last 24 hours) at 02/18/2023 1042 Last data filed at 02/18/2023 0418 Gross per 24 hour  Intake --  Output 3850 ml  Net -3850 ml      02/18/2023   12:00 AM 02/17/2023   10:04 PM 02/17/2023   12:00 PM  Last 3 Weights  Weight (lbs) 229 lb 4.5 oz 231 lb 14.8 oz 220 lb 0.3 oz  Weight (kg) 104 kg 105.2 kg 99.8 kg      Telemetry/ECG    Atrial fibrillation with RVR, ventricular rates into the 120s. Frequent PVCs with bigeminy - Personally Reviewed  Physical Exam .   GEN: No acute distress.   Neck: JVD difficult to evaluate due to body habitus Cardiac: Irregular irregular and rapid, no rubs, or gallops. Quiet systolic murmur Respiratory: diffuse inspiratory wheezing with bilateral lower lobes diminished and also with quiet crackles. GI: Soft, nontender, non-distended  MS: 1+ edema bilaterally in lower extremities  Assessment & Plan .   Autumn [name] is a 67 y.o. female with a hx of CAD s/p atherectomy/cutting balloon with stenting mLAD '20, HFrEF, pAF, moderate MR, COPD, PAD, parkinson's disease, substance abuse, HTN, chronic hep C who is being seen 02/17/2023 for the evaluation of atrial fibrillation at the request of Dr. Oswaldo Done.     Atrial fibrillation with RVR  Patient with hx of afib, prior DCCV. Has had recurrent afib in setting of medication noncompliance and substance use. During prior admission in June of this year with  afib, decision made to continue with rate control strategy.   HR remains elevated, afib with RVR into the 120s. Frequent PVCs with bigeminy. Continue Metoprolol succinate 150mg . Hopeful that rates improve with volume reduction.  Patient poor candidate for AAD. She was also switched to Xarelto to assist with compliance but even with this change, appears patient has not been consistent with doses, not a good DCCV candidate either because of this.  Diltiazem not recommended given reduced EF.  Patient on lovenox, would transition back to Xarelto now that she is tolerating PO. Discussed importance of med compliance with patient who states she is willing to take medications now.  Patient previously a patient of [facility] in [place] [place] but is now moving to [place] to live with family. Appears she will become a [facility] patient.  Acute on chronic HFrEF Acute on chronic hypoxic respiratory failure w/ COPD  Patient presented with symptoms of acute CHF, volume overload in setting of medication non-compliance. LVEF of 35-40%, moderated reduced RV, mildly elevated pulmonary artery pressure on last TTE. BNP this admission 1241 with CXR showing small bilateral pleural effusions.   Patient with ~3.8L output reported yesterday with IV diuresis. No intake recorded to determine net output. Still has evidence of volume overload on physical exam.  Continue with IV lasix 40mg  BID today. Likely to require several days of diuresis. Resume Losartan 25mg  today.  Would plan to resume SGLT2 closer to discharge. Could consider Spironolactone though with non-compliance hx, medication regimen should be kept as simple as possible.   CAD s/p prior DES to LAD  Patient without chest pain or evidence of ACS. No ASA with DOAC. Continue metoprolol and statin therapy.   Mitral regurgitation (severe)  As seen on 01/19/23 limited TTE, moderate to severe MV regurgitation without stenosis. Continue to monitor  For  questions or updates, please contact [facility] Please consult [email or web address] for contact info under        Signed, Perlie Gold, PA-C    Agree with note by Perlie Gold PA-c  Pt admitted with respiratory failure and CHF secondary to volume overload and failure to take home meds. She is in AFIB with RVR. H/O LV dysfunction with severe MR. Will need to be on DOAC, BB and ARB. Getting diuresed. Feeling clinically improved. Hopefully HR will slow down once diuresed and back on oral BB. Medication compliance will remain  an issue.  Runell Gess, M.D., FACP, [facility], [facility] [address] [place], [place]  [address]  [phone] 02/18/2023 2:56 PM

## 2023-02-18 NOTE — TOC Initial Note (Signed)
Transition of Care [facility]) - Initial/Assessment Note    Patient Details  Name: Autumn [name] MRN: [id number] Date of Birth: 28-Aug-1955  Transition of Care [facility]) CM/SW Contact:    Leone Haven, RN Phone Number: 02/18/2023, 4:11 PM  Clinical Narrative:                 From home with grandson and grandson's significant other, pta she ambulated with a cane, she has PCP, Dr Salvadore Farber, she has insurance on file, she currently does not have any HH services in place, but states she was going to be going to outpatient physical therapy on 7/3 but did not make it there.  She has a BSC and home oxygen 4 liters, she could not remember the name of the oxygen company.  She states her family will transport her home at dc, her family is her support system, she gets her medications from [facility] [address].  Expected Discharge Plan: Home w Home Health Services Barriers to Discharge: Continued Medical Work up   Patient Goals and CMS Choice Patient states their goals for this hospitalization and ongoing recovery are:: return home   Choice offered to / list presented to : NA      Expected Discharge Plan and Services In-house Referral: NA Discharge Planning Services: CM Consult Post Acute Care Choice: NA Living arrangements for the past 2 months: Single Family Home                 DME Arranged: N/A DME Agency: NA       HH Arranged: NA          Prior Living Arrangements/Services Living arrangements for the past 2 months: Single Family Home Lives with:: Adult Children (grandson and his significant other) Patient language and need for interpreter reviewed:: Yes Do you feel safe going back to the place where you live?: Yes      Need for Family Participation in Patient Care: Yes (Comment) Care giver support system in place?: Yes (comment) Current home services: DME (bsc and home oxygen 4 liters) Criminal Activity/Legal Involvement Pertinent to Current Situation/Hospitalization: No  - Comment as needed  Activities of Daily Living Home Assistive Devices/Equipment: Cane (specify quad or straight), Shower chair with back ADL Screening (condition at time of admission) Patient's cognitive ability adequate to safely complete daily activities?: Yes Is the patient deaf or have difficulty hearing?: Yes Does the patient have difficulty seeing, even when wearing glasses/contacts?: No Does the patient have difficulty concentrating, remembering, or making decisions?: No Patient able to express need for assistance with ADLs?: Yes Does the patient have difficulty dressing or bathing?: Yes Independently performs ADLs?: Yes (appropriate for developmental age) Does the patient have difficulty walking or climbing stairs?: Yes Weakness of Legs: Both Weakness of Arms/Hands: None  Permission Sought/Granted Permission sought to share information with : Case Manager Permission granted to share information with : Yes, Verbal Permission Granted              Emotional Assessment Appearance:: Appears stated age Attitude/Demeanor/Rapport: Engaged Affect (typically observed): Appropriate Orientation: : Oriented to Self, Oriented to Place, Oriented to  Time, Oriented to Situation Alcohol / Substance Use: Not Applicable Psych Involvement: No (comment)  Admission diagnosis:  Paroxysmal atrial fibrillation (HCC) [I48.0] Acute on chronic congestive heart failure, unspecified heart failure type (HCC) [I50.9] Acute hypoxic respiratory failure (HCC) [J96.01] Patient Active Problem List   Diagnosis Date Noted   Acute hypoxic respiratory failure (HCC) 02/17/2023   Acute on chronic  systolic heart failure (HCC) 01/18/2023   COPD (chronic obstructive pulmonary disease) (HCC) 12/03/2022   Parkinson's disease 12/03/2022   PAD (peripheral artery disease) (HCC) 12/03/2022   CAD (coronary artery disease) S/P stent to LAD 12/03/2022   Atrial fibrillation with rapid ventricular response (HCC)  01/11/2021   Tobacco dependence 01/11/2021   Homelessness 01/11/2021   Alcohol dependence with uncomplicated withdrawal (HCC) 06/27/2017   Polysubstance abuse (HCC) 06/27/2017   Depression 06/27/2017   Essential hypertension 06/27/2017   GERD (gastroesophageal reflux disease) 06/27/2017   Attention deficit disorder (ADD)    PCP:  Vivien Presto, MD Pharmacy:   [facility] [address] [place] [address] Phone: [phone] Fax: [phone]  [facility] [address] [place] [address] Phone: [phone] Fax: [phone]     Social Determinants of Health (SDOH) Social History: SDOH Screenings   Food Insecurity: No Food Insecurity (01/21/2023)  Housing: Low Risk  (01/21/2023)  Transportation Needs: No Transportation Needs (01/21/2023)  Utilities: At Risk (10/13/2022)   Received from [facility]  Alcohol Screen: Low Risk  (01/21/2023)  Financial Resource Strain: Low Risk  (01/21/2023)  Physical Activity: Unknown (01/30/2022)   Received from [facility]  Social Connections: Unknown (02/05/2023)   Received from [facility]  Stress: Stress Concern Present (01/30/2022)   Received from [facility]  Tobacco Use: High Risk (02/17/2023)   SDOH Interventions:     Readmission Risk Interventions    01/21/2023    1:29 PM 12/07/2022    1:27 PM  Readmission Risk Prevention Plan  Transportation Screening Complete Complete  PCP or Specialist Appt within 3-5 Days  Complete  HRI or Home Care Consult  Complete  Social Work Consult for Recovery Care Planning/Counseling  Complete  Palliative Care Screening  Not Applicable  Medication Review [employer]) Complete Complete  PCP or Specialist appointment within 3-5 days of discharge Complete   HRI or Home Care Consult Complete   SW Recovery Care/Counseling Consult Complete   Palliative Care Screening Not Applicable   Skilled Nursing Facility Not  Applicable

## 2023-02-18 NOTE — Progress Notes (Addendum)
HD#1 SUBJECTIVE:  Patient Summary: Autumn [name] is a 67 y.o. with a pertinent PMH of atrial fibrillation, substance use disorder, COPD, and systolic heart failure who presented with shortness of breath and worsening lower extremity edema and admitted for hypoxic respiratory failure.   Overnight Events: Patient was intermittently tachycardic and given Lopressor as needed.   Interim History: Patient was evaluated bedside this morning.  She notes improvement in her breathing.  Overall, she notes improvement in her symptoms.  She produced 3.8 L fluid yesterday with improvement in her peripheral edema but remains volume overloaded.  She denies any other signs or symptoms.  OBJECTIVE:  Vital Signs: Vitals:   02/18/23 0742 02/18/23 0751 02/18/23 0757 02/18/23 0813  BP:   (!) 135/122   Pulse:   63 (!) 131  Resp: 19  18   Temp:   98.6 F (37 C)   TempSrc:   Oral   SpO2: 95% 100% 100%   Weight:      Height:       Supplemental O2: Nasal Cannula SpO2: 100 % O2 Flow Rate (L/min): 3 L/min FiO2 (%): 40 %  Filed Weights   02/17/23 1200 02/17/23 2204 02/18/23 0000  Weight: 99.8 kg 105.2 kg 104 kg   CBC    Component Value Date/Time   WBC 8.8 02/18/2023 0155   RBC 4.39 02/18/2023 0155   HGB 11.7 (L) 02/18/2023 0155   HCT 38.3 02/18/2023 0155   PLT 331 02/18/2023 0155   MCV 87.2 02/18/2023 0155   MCH 26.7 02/18/2023 0155   MCHC 30.5 02/18/2023 0155   RDW 17.0 (H) 02/18/2023 0155   LYMPHSABS 1.9 02/17/2023 0845   MONOABS 0.5 02/17/2023 0845   EOSABS 0.1 02/17/2023 0845   BASOSABS 0.0 02/17/2023 0845   CMP     Component Value Date/Time   NA 135 02/18/2023 0155   K 4.1 02/18/2023 0155   CL 100 02/18/2023 0155   CO2 25 02/18/2023 0155   GLUCOSE 128 (H) 02/18/2023 0155   BUN 8 02/18/2023 0155   CREATININE 0.65 02/18/2023 0155   CALCIUM 8.9 02/18/2023 0155   PROT 6.2 (L) 01/18/2023 0935   ALBUMIN 2.7 (L) 01/18/2023 0935   AST 39 01/18/2023 0935   ALT 26 01/18/2023 0935    ALKPHOS 73 01/18/2023 0935   BILITOT 0.5 01/18/2023 0935   GFRNONAA >60 02/18/2023 0155    Intake/Output Summary (Last 24 hours) at 02/18/2023 0816 Last data filed at 02/18/2023 0418 Gross per 24 hour  Intake --  Output 3850 ml  Net -3850 ml   Net IO Since Admission: -3,850 mL [02/18/23 0816]  Physical Exam: Physical Exam HENT:     Head: Normocephalic and atraumatic.  Cardiovascular:     Rate and Rhythm: Rhythm irregular.  Pulmonary:     Breath sounds: Wheezing present.  Abdominal:     Palpations: Abdomen is soft.  Musculoskeletal:     Right lower leg: Edema present.     Left lower leg: Edema present.  Skin:    General: Skin is warm.  Neurological:     General: No focal deficit present.  Psychiatric:        Mood and Affect: Mood and affect normal.    ASSESSMENT/PLAN:  Assessment: Principal Problem:   Acute hypoxic respiratory failure (HCC) Active Problems:   Atrial fibrillation with rapid ventricular response (HCC)   COPD (chronic obstructive pulmonary disease) (HCC)   Acute on chronic systolic heart failure (HCC)   Plan: #Acute hypoxic respiratory  failure #History of COPD #History of chronic systolic heart failure At presentation, patient was admitted with respiratory distress that required BiPAP at 16/8 with FiO2 of 40%. ABG shoed improved ventilation and patient was weaned off of BiPAP. Her volume overload and respiratory distress was treated with IV Lasix, Solu-Medrol, bronchodilators. She had 3.8 L of output and today presented with continued signs of fluid overload on exam.  She still presents with wheezing but overall improvements in her breathing. She will receive 40 mg of IV Lasix and resume losartan.  -Continue IV Lasix 40 mg twice daily -Resume losartan 25 mg -budesonide and Atrovent  #Atrial fibrillation with RVR Patient presented with atrial fibrillation with RVR, frequent PVCs, prolonged QT.  Patient has a history of atrial fibrillation that is  difficult to control with unsuccessful cardioversion and staying in sinus rhythm.  She is not a good candidate for diltiazem either as she has reduced EF.  Her QTc was so amiodarone avoided.  Patient continues to be in A-fib with her heart rate in the 120s so her home dose of Toprol-XL was resumed.  Persistent tachycardia also treated with Lopressor as needed. She is scheduled to begin Xarelto. Of note, patient currently lives with family and notes that they well help her with medication management. -Begin Xarelto 20 mg -Resume Toprol XL 150 mg -Lopressor as needed for persistent tachycardia  #Anxiety Patient noted concerns of anxiety today -Begin hydroxyzine 25 mg 3 times daily as needed 3 times daily as needed  Best Practice: Diet: Cardiac diet VTE: Xarelto Code: Full DISPO: Anticipated discharge to Home pending  resolution of symptoms and medications management .  Signature: Morrie Sheldon, MD Internal Medicine Resident, PGY-1 [facility]  Pager: [phone]  Please contact the on call pager after 5 pm and on weekends at [phone].

## 2023-02-18 NOTE — Progress Notes (Signed)
Patient ID: Autumn [name], female   DOB: August 16, 1955, 67 y.o.   MRN: [id number]   02/18/23 0757  Assess: MEWS Score  Temp 98.6 F (37 C)  BP (!) 135/122  MAP (mmHg) 128  Pulse Rate 63  ECG Heart Rate (!) 134  Resp 18  SpO2 100 %  Assess: MEWS Score  MEWS Temp 0  MEWS Systolic 0  MEWS Pulse 3  MEWS RR 0  MEWS LOC 0  MEWS Score 3  MEWS Score Color Yellow  Assess: if the MEWS score is Yellow or Red  Were vital signs taken at a resting state? Yes  Focused Assessment No change from prior assessment  Does the patient meet 2 or more of the SIRS criteria? No  MEWS guidelines implemented  No, previously yellow, continue vital signs every 4 hours  Assess: SIRS CRITERIA  SIRS Temperature  0  SIRS Pulse 1  SIRS Respirations  0  SIRS WBC 0  SIRS Score Sum  1   MD paged. Respiratory status no longer requires bipap so order for oral metoprolol obtained. NPO order canceled.  Lidia Collum, RN

## 2023-02-18 NOTE — Progress Notes (Signed)
   02/17/23 2204  Assess: MEWS Score  Temp 98.7 F (37.1 C)  BP (!) 126/96  MAP (mmHg) 103  Pulse Rate (!) 57  ECG Heart Rate (!) 129  Resp 20  Level of Consciousness Alert  SpO2 95 %  O2 Device Nasal Cannula  O2 Flow Rate (L/min) 2 L/min  Assess: MEWS Score  MEWS Temp 0  MEWS Systolic 0  MEWS Pulse 2  MEWS RR 0  MEWS LOC 0  MEWS Score 2  MEWS Score Color Yellow  Assess: if the MEWS score is Yellow or Red  Were vital signs taken at a resting state? Yes  Focused Assessment No change from prior assessment  Does the patient meet 2 or more of the SIRS criteria? No  MEWS guidelines implemented  Yes, yellow  Treat  MEWS Interventions Considered administering scheduled or prn medications/treatments as ordered  Take Vital Signs  Increase Vital Sign Frequency  Yellow: Q2hr x1, continue Q4hrs until patient remains green for 12hrs  Escalate  MEWS: Escalate Yellow: Discuss with charge nurse and consider notifying provider and/or RRT  Notify: Charge Nurse/RN  Name of Charge Nurse/RN Notified Matthew RN  Assess: SIRS CRITERIA  SIRS Temperature  0  SIRS Pulse 1  SIRS Respirations  0  SIRS WBC 0  SIRS Score Sum  1

## 2023-02-18 NOTE — Progress Notes (Signed)
Patient ID: Autumn [name], female   DOB: 1956-06-25, 67 y.o.   MRN: [id number]  Patient is getting up out of bed every few minutes to urinate and only voiding about 50 ml of dark amber urine.Upon exertion, heart rate in 150s and oxygen stats drop to low 80s. Patient also experiencing high levels of anxiety.  Md notified.  Lidia Collum, RN

## 2023-02-18 NOTE — Progress Notes (Signed)
Patient ID: Autumn [name], female   DOB: [birth year], 67 y.o.   MRN: [id number]  Patient heart rate afib RVR at rest 120s, with exertion 150s. Frequent PVCs and bigeminy. Dr Allyson Sabal aware, see note:  "HR remains elevated, afib with RVR into the 120s. Frequent PVCs with bigeminy. Continue Metoprolol succinate 150mg . Hopeful that rates improve with volume reduction. "  No further orders at this time.  Lidia Collum, RN

## 2023-02-18 NOTE — Progress Notes (Signed)
   Heart Failure Stewardship Pharmacist Progress Note   PCP: Corrington, Kip A, MD PCP-Cardiologist: Thomasene Ripple, DO    HPI:  67 yo F with PMH of CHF, CAD, HTN, afib, Parkinson's, and polysubstance use.    Admitted in 08/2022 with shortness of breath, found to be in afib RVR. LVEF 40-45%. At follow up, she remained in afib and underwent cardioversion. Ongoing drug use suspected to have contributed to afib RVR.    Admitted 11/2022 with shortness of breath and palpitations, back in afib RVR. Noncompliance with medications and continued substance use. LVEF 40-45%.   Admitted 01/2023 with shortness of breath and LE edema. In afib RVR again. CXR with mild CHF. ECHO 6/11 showed LVEF 35-40%, global hypokinesis, RV moderately reduced, moderate to severe MR. Discharged and was a no show for follow up.  Readmitted 7/10 with shortness of breath and LE edema. Reports she has not taken her medications since hospital discharge. She states they were difficult for her to understand instructions. BNP 1231. CXR with pulmonary edema and bilateral pleural effusions. EKG with afib RVR. Found to be hypoxic and required BiPAP.   Current HF Medications: Diuretic: furosemide 40 mg IV BID Beta Blocker: metoprolol XL 150 mg daily  Prior to admission HF Medications: None - not taking prescribed lasix 40 mg daily, Farxiga 10 mg daily, or metoprolol XL 150 mg daily  Pertinent Lab Values: Serum creatinine 0.65, BUN 8, Potassium 4.1, Sodium 135, BNP 1231.2, Magnesium 1.8  Vital Signs: Weight: 229 lbs (admission weight: 231 lbs) Blood pressure: 130/100s  Heart rate: 120s  I/O: net -3.6L yesterday; net -3.9L since admission  Medication Assistance / Insurance Benefits Check: Does the patient have prescription insurance?  Yes Type of insurance plan: [place] Medicaid  Outpatient Pharmacy:  Prior to admission outpatient pharmacy: [facility] Is the patient willing to use [facility] TOC pharmacy at discharge? Yes    Assessment: 1.  Acute on chronic systolic CHF (LVEF 35-40%) with medication noncompliance. NYHA class III symptoms. - Continue furosemide 40 mg IV BID. Strict I/Os and daily weights. Keep K>4 and Mg>2. - Continue metoprolol XL 150 mg daily - may need to consolidate to 1 tablet of 100 mg or 200 mg per day to help with patient understanding.  - Consider adding losartan 25 mg daily - has issues with noncompliance in the past. Will need a simplistic regimen and BID dosing with Sherryll Burger will likely be too complex for her. Patient prefers once per day medications.  - Consider restarting Farxiga 10 mg daily   Plan: 1) Medication changes recommended at this time: - Add losartan 25 mg daily - Restart Farxiga 10 mg daily  2) Patient assistance: - Farxiga copay $0  3)  Education  - To be completed prior to discharge - patient sleeping at this time  Sharen Hones, PharmD, BCPS Heart Failure Engineer, building services Phone [phone]

## 2023-02-18 NOTE — Progress Notes (Signed)
Heart Failure Nurse Navigator Progress Note  PCP: Corrington, Kip A, MD PCP-Cardiologist: Tobb Admission Diagnosis: Acute on chronic congestive heart failure, Paroxysmal atrial fibrillation.  Admitted from: Home via EMS  Presentation:   Autumn [name] presented with chest pain, shortness of breath and BLE edema for a few weeks, patient reported her PCP stopped her lasix, wears 4 litters oxygen at baseline, patient reported to being noncompliant with all her medications since last admission on 01/21/23 for A-fib and CHF. BP 128/108, HR 67, BNP 1,231, EKG showed atrial fibrillation, IV lasix given.   Patient originally had a HF TOC appointment from previous admission in June for 02/05/23, cancelled that appointment was rescheduled for 02/09/2023, patient was a No Show, reported that she forgot about appointment.   Patient was reeducated on the sign and symptoms of heart failure, daily weights, when to call her doctor or go to the ED, Diet/ fluid restrictions, reported she hasn't been watching what she is eating or drinking, education on medication compliance and taking all medication as prescribed, and attending all medical appointments, patient verbalized her understanding and stated " she will do better " a HF TOC appointment was scheduled for 03/01/2023 @ 11 am. Patient stated her son will bring her to appointment.   ECHO/ LVEF: 35-40%  Clinical Course:  Past Medical History:  Diagnosis Date   Anxiety    Arthritis    Attention deficit disorder (ADD)    COPD (chronic obstructive pulmonary disease) (HCC)    Depression    Drug abuse (HCC)    Fibromyalgia    HFrEF (heart failure with reduced ejection fraction) (HCC)    Hyperlipidemia    Paroxysmal Atrial fibrillation      Social History   Socioeconomic History   Marital status: Divorced    Spouse name: Not on file   Number of children: 3   Years of education: Not on file   Highest education level: High school graduate  Occupational  History   Occupation: disabled  Tobacco Use   Smoking status: Every Day    Current packs/day: 0.50    Average packs/day: 0.5 packs/day for 20.0 years (10.0 ttl pk-yrs)    Types: Cigarettes   Smokeless tobacco: Never   Tobacco comments:    Half pack daily going to try patches  Vaping Use   Vaping status: Every Day   Substances: Nicotine, Flavoring  Substance and Sexual Activity   Alcohol use: Not Currently    Comment: A fifth of vodka a day - none in a year   Drug use: Yes    Types: Cocaine, Marijuana    Comment: uses marijuana regularly; used cocaine " a long time ago"   Sexual activity: Not on file  Other Topics Concern   Not on file  Social History Narrative   Not on file   Social Determinants of Health   Financial Resource Strain: Low Risk  (01/21/2023)   Overall Financial Resource Strain (CARDIA)    Difficulty of Paying Living Expenses: Not very hard  Food Insecurity: No Food Insecurity (01/21/2023)   Hunger Vital Sign    Worried About Running Out of Food in the Last Year: Never true    Ran Out of Food in the Last Year: Never true  Transportation Needs: No Transportation Needs (01/21/2023)   PRAPARE - Administrator, Civil Service (Medical): No    Lack of Transportation (Non-Medical): No  Physical Activity: Unknown (01/30/2022)   Received from [facility]   Exercise  Vital Sign    Days of Exercise per Week: 0 days    Minutes of Exercise per Session: Not on file  Stress: Stress Concern Present (01/30/2022)   Received from [facility] - Occupational Stress Questionnaire    Feeling of Stress : Very much  Social Connections: Unknown (02/05/2023)   Received from [facility]   Social Network    Social Network: Not on file   Education Assessment and Provision:  Detailed education and instructions provided on heart failure disease management including the following:  Signs and symptoms of Heart Failure When to  call the physician Importance of daily weights Low sodium diet Fluid restriction Medication management Anticipated future follow-up appointments  Patient education given on each of the above topics.  Patient acknowledges understanding via teach back method and acceptance of all instructions.  Education Materials:  "Living Better With Heart Failure" Booklet, HF zone tool, & Daily Weight Tracker Tool.  Patient has scale at home: yes Patient has pill box at home: yes    High Risk Criteria for Readmission and/or Poor Patient Outcomes: Heart failure hospital admissions (last 6 months): 1  No Show rate: 20% Difficult social situation: No, moved to [place] and lives with her son and family.  Demonstrates medication adherence: No Primary Language: English Literacy level: Reading, writing and comprehension  Barriers of Care:   Medication compliance Diet/ fluid restrictions/ daily weights compliance  Considerations/Referrals:   Referral made to Heart Failure Pharmacist Stewardship: yes Referral made to Heart Failure CSW/NCM TOC: No Referral made to Heart & Vascular TOC clinic: Yes, 03/01/2023 @ 11 am.   Items for Follow-up on DC/TOC: Medication compliance Diet/ fluids/ daily weights compliance Continued HF education   Rhae Hammock, BSN, RN Heart Failure Teacher, adult education Only

## 2023-02-18 NOTE — Hospital Course (Addendum)
#  Acute hypoxic respiratory failure #History of COPD #History of chronic systolic heart failure Patient presented to the ED with progressive dyspnea and lower extremity edema.  Her baseline requirement is 2 L, but leading up to admission, it was 4 L.  In the ED, she received IV Lasix with concerns for volume overload.  CXR showed vascular congestion with bilateral interstitial opacities consistent with pulmonary edema with an elevated BNP.  Given her respiratory distress, she was started on BiPAP 16/8 with an FiO2 of 40%.  Her ABG demonstrated improved ventilation so she was weaned off of BiPAP and transitioned to 4 L [place]. For her volume overload, she was diuresed with IV Lasix.  For her COPD exacerbation, she treated with prednisone and bronchodilators.  Albuterol was avoided given her A-fib with RVR.  She was restarted on losartan.  At discharge, she has been able to independently ambulate.   #Atrial fibrillation with RVR Patient presented to the ED with atrial fibrillation with RVR, frequent PVCs, and a prolonged QT.  She has a history of atrial fibrillation that has been difficult to control with unsuccessful attempts at cardioversion and staying in sinus rhythm.  She is also not a good candidate for diltiazem as she has reduced EF.  She demonstrated persistent tachycardia that was treated with Lopressor as needed.  Her Toprol-XL was increased to 200 mg.  She was also initially started on Xarelto but switched to rivaroxaban for a simpler once daily dosing. Her tachycardia remained in the 110s to 120s so IV digoxin was given which brought her heart rate down to the 90s to 100s.  Patient to continue metoprolol succinate 200 mg once daily and rivaroxaban 20 mg once daily after discharge.   #Opioid use disorder Patient has a history of polysubstance use disorder and takes Suboxone 8-2 mg 3 times daily for opioid use disorder at home.  Patient received home Suboxone during hospitalization and will continue  discharge.  After discharge, follow-up outpatient.  #Anxiety Patient noted history anxiety has been exacerbated by her current hospitalization that was treated with hydroxyzine as needed. Near discharge, psychiatry was also consulted for depression noted by patient. She resumed BuSpar 10 mg twice daily, Abilify 5 mg once daily, and Cymbalta 30 mg once daily.   #T2DM Last A1c was 6.1 in 10/2022.  Glucose was stable throughout hospitalization.  After discharge, follow-up outpatient.  #History of Parkinson's disease Patient follows with outpatient neurology.  Denied any symptoms during hospitalization.  Carbidopa-levodopa held during hospitalization but will continue at discharge.  #History of CAD S/P stenting At admission, troponin was low at 15 and 17.  Patient denies any chest pain at discharge.  Statin continued throughout hospitalization and will continue at discharge.  #Chronic hep C infection Unclear if this has been treated but noted in chart in 2008.  No signs of cirrhosis follow-up outpatient.  7/15 Less SOB, more congested, no chest pain, walked up the hall yesterday with no SOB, similar to home.

## 2023-02-19 DIAGNOSIS — I4811 Longstanding persistent atrial fibrillation: Secondary | ICD-10-CM | POA: Diagnosis not present

## 2023-02-19 DIAGNOSIS — I340 Nonrheumatic mitral (valve) insufficiency: Secondary | ICD-10-CM

## 2023-02-19 DIAGNOSIS — I519 Heart disease, unspecified: Secondary | ICD-10-CM

## 2023-02-19 DIAGNOSIS — J9601 Acute respiratory failure with hypoxia: Secondary | ICD-10-CM | POA: Diagnosis not present

## 2023-02-19 LAB — BASIC METABOLIC PANEL
Anion gap: 11 (ref 5–15)
BUN: 19 mg/dL (ref 8–23)
CO2: 30 mmol/L (ref 22–32)
Calcium: 9.3 mg/dL (ref 8.9–10.3)
Chloride: 96 mmol/L — ABNORMAL LOW (ref 98–111)
Creatinine, Ser: 0.93 mg/dL (ref 0.44–1.00)
GFR, Estimated: >60 mL/min (ref >60)
Glucose, Bld: 155 mg/dL — ABNORMAL HIGH (ref 70–99)
Potassium: 4.3 mmol/L (ref 3.5–5.1)
Sodium: 137 mmol/L (ref 135–145)

## 2023-02-19 MED ORDER — FUROSEMIDE 10 MG/ML IJ SOLN
40.0000 mg | Freq: Two times a day (BID) | INTRAMUSCULAR | Status: DC
  Administered 2023-02-19: 40 mg via INTRAVENOUS
  Filled 2023-02-19: qty 4

## 2023-02-19 MED ORDER — METOPROLOL SUCCINATE ER 100 MG PO TB24
200.0000 mg | ORAL_TABLET | Freq: Every day | ORAL | Status: DC
  Administered 2023-02-20 – 2023-02-22 (×3): 200 mg via ORAL
  Filled 2023-02-19 (×3): qty 2

## 2023-02-19 MED ORDER — METOPROLOL SUCCINATE ER 25 MG PO TB24: 25.0000 mg | ORAL_TABLET | Freq: Once | ORAL | Status: DC

## 2023-02-19 NOTE — TOC Progression Note (Addendum)
Transition of Care [facility]) - Progression Note    Patient Details  Name: Autumn [name] MRN: [id number] Date of Birth: [birth year]  Transition of [facility]) CM/SW Contact  Leone Haven, RN Phone Number: 02/19/2023, 1:14 PM  Clinical Narrative:    NCM was notified by MD that patient wants a hospital bed,  NCM spoke with patient and she states she does not have a preference of the agency.  NCM made referral to [facility] with [employer] for hospital bed. It is to be delivered to the address on file and to be coordinated with grand daughter. She also states she did have outpt pt set up for her before and she did not go because something came up, so she would like to go this time to the one on [address]  NCM made referral thru [facility] for outpt physical therapy  [facility] [address] which is the recommendation from pt eval.    Expected Discharge Plan: Home w Home Health Services Barriers to Discharge: Continued Medical Work up  Expected Discharge Plan and Services In-house Referral: NA Discharge Planning Services: CM Consult Post Acute Care Choice: NA Living arrangements for the past 2 months: Single Family Home                 DME Arranged: N/A DME Agency: NA       HH Arranged: NA           Social Determinants of Health (SDOH) Interventions SDOH Screenings   Food Insecurity: Patient Declined (02/18/2023)  Housing: Patient Declined (02/18/2023)  Transportation Needs: Patient Declined (02/18/2023)  Utilities: At Risk (10/13/2022)   Received from [facility]  Alcohol Screen: Low Risk  (01/21/2023)  Financial Resource Strain: Low Risk  (01/21/2023)  Physical Activity: Unknown (01/30/2022)   Received from [facility]  Social Connections: Unknown (02/05/2023)   Received from [facility]  Stress: Stress Concern Present (01/30/2022)   Received from [facility]  Tobacco Use: High Risk (02/17/2023)    Readmission Risk Interventions    01/21/2023    1:29 PM 12/07/2022    1:27 PM   Readmission Risk Prevention Plan  Transportation Screening Complete Complete  PCP or Specialist Appt within 3-5 Days  Complete  HRI or Home Care Consult  Complete  Social Work Consult for Recovery Care Planning/Counseling  Complete  Palliative Care Screening  Not Applicable  Medication Review [employer]) Complete Complete  PCP or Specialist appointment within 3-5 days of discharge Complete   HRI or Home Care Consult Complete   SW Recovery Care/Counseling Consult Complete   Palliative Care Screening Not Applicable   Skilled Nursing Facility Not Applicable

## 2023-02-19 NOTE — Evaluation (Signed)
Occupational Therapy Evaluation Patient Details Name: Autumn [name] MRN: [id number] DOB: Mar 12, 1956 Today's Date: 02/19/2023   History of Present Illness 67 y.o. presented with shortness of breath and worsening lower extremity edema and admitted for hypoxic respiratory failure.  PMH of atrial fibrillation, substance use disorder,  parkinson's disease, COPD, and systolic heart failure.   Clinical Impression   PTA pt lives with her grandson and his significant other. At baseline Autumn [name] uses a cane in the home and is modified independent with ADL tasks. Family assists with IADL tasks. Pt states she was suppose to start outpt PT before hospital admission however is unable to state why she was referred. Pt able to complete household ambulation distance and ADL task using a rollator with S due to line management only. HR 114 - 130s however pt states "I have A-fib and this is normal". SpO2 remained in the 90s on 2L with 2/4 DOE. Pt states her breathing feels back to "what is normal for her". Recommend DC home with intermittent S and resuming outpt PT. OT will follow up acutely to review energy conservation and warning signs/symptoms of CHF exacerbation. Pt would benefit from CHF booklet - nsg made aware. BSC placed by her recliner for her to use - NT made aware.      Recommendations for follow up therapy are one component of a multi-disciplinary discharge planning process, led by the attending physician.  Recommendations may be updated based on patient status, additional functional criteria and insurance authorization.   Assistance Recommended at Discharge Intermittent Supervision/Assistance  Patient can return home with the following Assistance with cooking/housework;Direct supervision/assist for medications management;Direct supervision/assist for financial management;Assist for transportation    Functional Status Assessment  Patient has not had a recent decline in their functional status  Equipment  Recommendations  None recommended by OT    Recommendations for Other Services       Precautions / Restrictions Precautions Precautions: Other (comment) (impulsive)      Mobility Bed Mobility Overal bed mobility: Independent                  Transfers Overall transfer level: Modified independent                        Balance Overall balance assessment: Mild deficits observed, not formally tested                                         ADL either performed or assessed with clinical judgement   ADL Overall ADL's : At baseline                                       General ADL Comments: Began education on energy conservation strategies using the 5 "Ps" prioritizing, planning, pacing herself, considering the positioning she is completing tasks in and using pursed lip breathing     Vision Baseline Vision/History: 1 Wears glasses       Perception     Praxis      Pertinent Vitals/Pain       Hand Dominance Right   Extremity/Trunk Assessment Upper Extremity Assessment Upper Extremity Assessment: Overall WFL for tasks assessed   Lower Extremity Assessment Lower Extremity Assessment: Overall WFL for tasks assessed   Cervical / Trunk Assessment Cervical /  Trunk Assessment: Normal   Communication Communication Communication: No difficulties   Cognition Arousal/Alertness: Awake/alert Behavior During Therapy: Impulsive Overall Cognitive Status: History of cognitive impairments - at baseline                                       General Comments       Exercises Exercises: Other exercises Other Exercises Other Exercises: pursed lip breathing   Shoulder Instructions      Home Living Family/patient expects to be discharged to:: Private residence Living Arrangements: Children;Other relatives Available Help at Discharge: Family;Available PRN/intermittently Type of Home: House Home Access:  Stairs to enter [employer] of Steps: 5 Entrance Stairs-Rails: Can reach both Home Layout: Two level Alternate Level Stairs-Number of Steps: 6 Alternate Level Stairs-Rails: Can reach both Bathroom Shower/Tub: Tub/shower unit;Curtain   Bathroom Toilet: Handicapped height Bathroom Accessibility: Yes How Accessible: Accessible via walker Home Equipment: Rollator (4 wheels);Cane - single point;BSC/3in1;Shower seat          Prior Functioning/Environment Prior Level of Function : Independent/Modified Independent             Mobility Comments: ambulatory with SPC typically, PRN rollator ADLs Comments: gets "flabergasted" with ADL; family does IADL tasks; Family takes her to appointments        OT Problem List: Cardiopulmonary status limiting activity;Decreased activity tolerance      OT Treatment/Interventions: Self-care/ADL training;Therapeutic exercise;DME and/or AE instruction;Energy conservation;Therapeutic activities;Patient/family education    OT Goals(Current goals can be found in the care plan section) Acute Rehab OT Goals Patient Stated Goal: to go home OT Goal Formulation: With patient Time For Goal Achievement: 03/05/23 Potential to Achieve Goals: Good  OT Frequency: Min 1X/week    Co-evaluation              AM-PAC OT "6 Clicks" Daily Activity     Outcome Measure Help from another person eating meals?: None Help from another person taking care of personal grooming?: None Help from another person toileting, which includes using toliet, bedpan, or urinal?: None Help from another person bathing (including washing, rinsing, drying)?: None Help from another person to put on and taking off regular upper body clothing?: None Help from another person to put on and taking off regular lower body clothing?: None 6 Click Score: 24   End of Session Equipment Utilized During Treatment: Rollator (4 wheels);Oxygen (3L) Nurse Communication: Mobility  status  Activity Tolerance: Patient tolerated treatment well Patient left: in chair;with call bell/phone within reach;with chair alarm set  OT Visit Diagnosis: Other (comment) (fatigue)                Time: 1005-1040 OT Time Calculation (min): 35 min Charges:  OT General Charges $OT Visit: 1 Visit OT Evaluation $OT Eval Low Complexity: 1 Low OT Treatments $Self Care/Home Management : 8-22 mins  Luisa Dago, OT/L   Acute OT Clinical Specialist Acute Rehabilitation Services Pager [phone] Office [phone]   [facility] 02/19/2023, 10:59 AM

## 2023-02-19 NOTE — Progress Notes (Signed)
   Heart Failure Stewardship Pharmacist Progress Note   PCP: Corrington, Kip A, MD PCP-Cardiologist: Thomasene Ripple, DO    HPI:  67 yo F with PMH of CHF, CAD, HTN, afib, Parkinson's, and polysubstance use.    Admitted in 08/2022 with shortness of breath, found to be in afib RVR. LVEF 40-45%. At follow up, she remained in afib and underwent cardioversion. Ongoing drug use suspected to have contributed to afib RVR.    Admitted 11/2022 with shortness of breath and palpitations, back in afib RVR. Noncompliance with medications and continued substance use. LVEF 40-45%.   Admitted 01/2023 with shortness of breath and LE edema. In afib RVR again. CXR with mild CHF. ECHO 6/11 showed LVEF 35-40%, global hypokinesis, RV moderately reduced, moderate to severe MR. Discharged and was a no show for follow up.  Readmitted 7/10 with shortness of breath and LE edema. Reports she has not taken her medications since hospital discharge. She states they were difficult for her to understand instructions. BNP 1231. CXR with pulmonary edema and bilateral pleural effusions. EKG with afib RVR. Found to be hypoxic and required BiPAP.   Current HF Medications: Beta Blocker: metoprolol XL 150 mg daily ACE/ARB/ARNI: losartan 25 mg daily  Prior to admission HF Medications: None - not taking prescribed lasix 40 mg daily, Farxiga 10 mg daily, or metoprolol XL 150 mg daily  Pertinent Lab Values: Serum creatinine 0.93, BUN 19, Potassium 4.3, Sodium 137, BNP 1231.2, Magnesium 1.8  Vital Signs: Weight: 227 lbs (admission weight: 231 lbs) Blood pressure: 110/80s  Heart rate: 110s  I/O: net -1.4L yesterday; net -4.8L since admission  Medication Assistance / Insurance Benefits Check: Does the patient have prescription insurance?  Yes Type of insurance plan: [place] [employer]  Outpatient Pharmacy:  Prior to admission outpatient pharmacy: [facility] Is the patient willing to use [facility] TOC pharmacy at discharge? Yes    Assessment: 1.  Acute on chronic systolic CHF (LVEF 35-40%) with medication noncompliance. NYHA class III symptoms. - IV lasix discontinued by primary team. Still with some volume overload on exam. Cardiology recommending to continue IV lasix today and resume PO tomorrow. Strict I/Os and daily weights. Keep K>4 and Mg>2. - Continue metoprolol XL 150 mg daily - patient prefers to consolidate to 1 tablet per day to help with patient understanding. Recommend increasing to 200 mg once daily. She is still tachycardic.  - Continue losartan 25 mg daily - has issues with noncompliance in the past. Will need a simplistic regimen and BID dosing with Sherryll Burger is too complex for her. Patient prefers once per day medications.  - Consider restarting Farxiga 10 mg daily prior to discharge   Plan: 1) Medication changes recommended at this time: - Restart IV lasix - Restart Farxiga tomorrow - Increase to metoprolol XL 200 mg daily  2) Patient assistance: - Farxiga copay $0  3)  Education  - Patient has been educated on current HF medications and potential additions to HF medication regimen - Patient verbalizes understanding that over the next few months, these medication doses may change and more medications may be added to optimize HF regimen - Patient has been educated on basic disease state pathophysiology and goals of therapy   Sharen Hones, PharmD, BCPS Heart Failure Stewardship Pharmacist Phone [phone]

## 2023-02-19 NOTE — Progress Notes (Signed)
   02/19/23 1200  PT Visit Information  Last PT Received On 02/19/23  Reason Eval/Treat Not Completed PT screened, no needs identified, will sign off   PT - Assessment/Plan  Follow Up Recommendations Outpatient PT (Pt had appt PTA for this hospitalization therefore recommend her keep this appt)   Per OT, pt was Modif I with mobility with use of cane as pt used PTA. Pt appears at baseline per OT compared to recent June admit. Do not feel that pt needs PT services in the hospital therefore will not follow pt.   Shronda Boeh M,PT Acute Rehab Services [phone]

## 2023-02-19 NOTE — Progress Notes (Signed)
HD#2 SUBJECTIVE:  Patient Summary: Autumn [name] is a 67 y.o. with a pertinent PMH of atrial fibrillation, opioid use disorder, COPD, and systolic heart failure who presented with shortness of breath and admitted for hypoxic respiratory failure.   Overnight Events: Yesterday, patient was distressed. She continues to be tachycardic at 150 while exerting herself and 120 while at rest.  Interim History: Patient was evaluated at bedside.  She continues to note improvement in her breathing with no concerns for shortness of breath, chest pain, or other concerns.  She is able to able to ambulate independently.  She continues to need 4 L of nasal cannula.  OBJECTIVE:  Vital Signs: Vitals:   02/19/23 0201 02/19/23 0400 02/19/23 0402 02/19/23 0500  BP:  (!) 126/93 (!) 126/93   Pulse:  (!) 104    Resp: 17 14 18    Temp:   (!) 97.1 F (36.2 C)   TempSrc:   Axillary   SpO2:      Weight:    103.4 kg  Height:       CMP     Component Value Date/Time   NA 137 02/19/2023 0116   K 4.3 02/19/2023 0116   CL 96 (L) 02/19/2023 0116   CO2 30 02/19/2023 0116   GLUCOSE 155 (H) 02/19/2023 0116   BUN 19 02/19/2023 0116   CREATININE 0.93 02/19/2023 0116   CALCIUM 9.3 02/19/2023 0116   PROT 6.2 (L) 01/18/2023 0935   ALBUMIN 2.7 (L) 01/18/2023 0935   AST 39 01/18/2023 0935   ALT 26 01/18/2023 0935   ALKPHOS 73 01/18/2023 0935   BILITOT 0.5 01/18/2023 0935   GFRNONAA >60 02/19/2023 0116   Supplemental O2: Nasal Cannula SpO2: 100 % O2 Flow Rate (L/min): 4 L/min FiO2 (%): 40 %  Filed Weights   02/17/23 2204 02/18/23 0000 02/19/23 0500  Weight: 105.2 kg 104 kg 103.4 kg    Intake/Output Summary (Last 24 hours) at 02/19/2023 0727 Last data filed at 02/18/2023 1930 Gross per 24 hour  Intake --  Output 1100 ml  Net -1100 ml   Net IO Since Admission: -4,950 mL [02/19/23 0727]  Physical Exam: Physical Exam Constitutional:      Appearance: Normal appearance.  HENT:     Head: Normocephalic and  atraumatic.  Cardiovascular:     Rate and Rhythm: Tachycardia present.  Pulmonary:     Breath sounds: Wheezing present.  Musculoskeletal:     Right lower leg: Edema present.     Left lower leg: Edema present.  Skin:    General: Skin is warm.  Neurological:     General: No focal deficit present.     Mental Status: She is alert.  Psychiatric:        Mood and Affect: Mood normal.        Behavior: Behavior normal.    ASSESSMENT/PLAN:  Assessment: Principal Problem:   Acute hypoxic respiratory failure (HCC) Active Problems:   Atrial fibrillation with rapid ventricular response (HCC)   COPD (chronic obstructive pulmonary disease) (HCC)   Acute on chronic systolic heart failure (HCC)  Plan: #Acute hypoxic respiratory failure #History of COPD #History of chronic systolic heart failure Patient continues to endorse improvement in her breathing.  Her volume status has improved and she is able to ambulate independently to the restroom.  She continues to demonstrate wheezing.  She is currently on 4 L of nasal cannula and is being weaned down to 3 L as her O2 saturation is improving.  We will  continue diuresis to improve her volume and treat the COPD exacerbation with prednisone and bronchodilators but avoid albuterol given her atrial fibrillation with RVR.   -Continue prednisone 40 mg -Continue losartan 25 mg -Continue IV Lasix 40 mg x 2 -Continue budesonide 0.5 mg x 2 -Continue ipratropium 0.5 mg every 6 hours -Continue PT/OT  #Atrial fibrillation with RVR Patient continues to demonstrate tachycardia in the 110s to 120s but denies associated symptoms.  Her Toprol-XL was increased to 200 mg.  We will continue to work on her medication management as she hopes to only avoid taking medication multiple times the day as she can forget. -Toprol-XL increased to 200 mg -Continue Xarelto 20 mg  #Opioid use disorder Patient has a history of opioid use disorder treated by Suboxone.  Yesterday  she demonstrated restlessness with improvement after the Suboxone administration. -Suboxone 8-2 mg 1 tablet 3 times daily  #Anxiety Patient has history of anxiety has been exacerbated by her current hospitalization. -Continue hydroxyzine 25 mg 3 times daily as needed  Best Practice: Diet: Cardiac diet VTE: Xarelto Code: Full Therapy Recs:  Home , resuming outpatient PT Family Contact: Granddaughter DISPO: Anticipated discharge to Home pending  resolution of symptoms and medication management .  Signature: Morrie Sheldon, MD Internal Medicine Resident, PGY-1 [facility]  Pager: [phone]  Please contact the on call pager after 5 pm and on weekends at [phone].

## 2023-02-19 NOTE — Progress Notes (Addendum)
Patient Name: Autumn [name] Date of Encounter: 02/19/2023 [facility] Cardiologist: Thomasene Ripple, DO   Interval Summary  .    Patient reports improvement in breathing this morning and overall feels much better. Does not seem to be symptomatic with afib. Volume status appears improved.  Vital Signs .    Vitals:   02/19/23 0500 02/19/23 0811 02/19/23 0837 02/19/23 0859  BP:  96/67  115/79  Pulse:  (!) 110 (!) 116 (!) 121  Resp:  16 14   Temp:  98.1 F (36.7 C)    TempSrc:  Oral    SpO2:  95%    Weight: 103.4 kg     Height:        Intake/Output Summary (Last 24 hours) at 02/19/2023 0938 Last data filed at 02/19/2023 0815 Gross per 24 hour  Intake 120 ml  Output 1100 ml  Net -980 ml      02/19/2023    5:00 AM 02/18/2023   12:00 AM 02/17/2023   10:04 PM  Last 3 Weights  Weight (lbs) 227 lb 15.3 oz 229 lb 4.5 oz 231 lb 14.8 oz  Weight (kg) 103.4 kg 104 kg 105.2 kg      Telemetry/ECG    Persistent afib with RVR. Rates primarily in the low 100s but occasional spikes into the 130s - Personally Reviewed  Physical Exam .   GEN: No acute distress.   Neck: Unable to assess JVP Cardiac: irregularly irregular, no rubs, or gallops. Faint systolic murmur at apex. Respiratory: Bilateral bases diminished but without crackles as previously noted. GI: Soft, nontender, non-distended  MS: trace bilateral lower extremity edema.   Assessment & Plan .     Autumn [name] is a 67 y.o. female with a hx of CAD s/p atherectomy/cutting balloon with stenting mLAD '20, HFrEF, pAF, moderate MR, COPD, PAD, parkinson's disease, substance abuse, HTN, chronic hep C who is being seen 02/17/2023 for the evaluation of atrial fibrillation at the request of Dr. Oswaldo Done.       Atrial fibrillation with RVR   Patient with hx of afib, prior DCCV. Has had recurrent afib in setting of medication noncompliance and substance use. During prior admission in June of this year with afib, decision made  to continue with rate control strategy.    HR remains elevated, afib with RVR into the 120s. Frequent PVCs with bigeminy. Continue Metoprolol succinate 150mg . Hopeful that rates improve with volume reduction.  Patient poor candidate for AAD. She was also switched to Xarelto to assist with compliance but even with this change, appears patient has not been consistent with doses, not a good DCCV candidate either because of this.  Diltiazem not recommended given reduced EF.  Continue Xarelto 20mg  Patient previously a patient of [facility] in [place] but is now moving to [place] to live with family. Appears she will become a [facility] patient.   Acute on chronic HFrEF Acute on chronic hypoxic respiratory failure w/ COPD   Patient presented with symptoms of acute CHF, volume overload in setting of medication non-compliance. LVEF of 35-40%, moderated reduced RV, mildly elevated pulmonary artery pressure on last TTE. BNP this admission 1241 with CXR showing small bilateral pleural effusions.    Patient with net output of 4.8L reported (though does not appear that intake accurately measured). Improved volume status on exam today. Continue with IV lasix 40mg  BID today with tentative plan transition to oral Lasix tomorrow, 7/13.  Continue Losartan 25mg . Would plan to resume SGLT2  closer to discharge. Could consider Spironolactone though with non-compliance hx, medication regimen should be kept as simple as possible.    CAD s/p prior DES to LAD   Patient without chest pain or evidence of ACS. No ASA with DOAC. Continue metoprolol and statin therapy.    Mitral regurgitation (severe)   As seen on 01/19/23 limited TTE, moderate to severe MV regurgitation without stenosis. Continue to monitor.   For questions or updates, please contact [facility] Please consult [email or web address] for contact info under        Signed, Perlie Gold, PA-C   Agree with note by Perlie Gold  PA-C  Patient mated with heart failure and A-fib with RVR.  Currently on nasal prong oxygen.  Good diuresis to IV Lasix.  On Xarelto.  Heart rate is still mildly elevated on high-dose beta-blocker.  Major issue is going to be medication compliance.  She does feel clinically improved.  Her peripheral edema has almost resolved.  She does continue to smoke at home.  Will transition to p.o. diuretics in the next 24 to 48 hours.  Will get physical therapy consult for ambulation.  Hopefully home 48 to 72 hours.  Will arrange outpatient follow-up.  Runell Gess, M.D., FACP, [facility], [facility] [place] [facility] [address] [place], [place]  [address]  [phone] 02/19/2023 10:03 AM

## 2023-02-19 NOTE — Care Management Important Message (Signed)
Important Message  Patient Details  Name: Autumn [name] MRN: [id number] Date of Birth: 20-Oct-1955   Medicare Important Message Given:  Yes     [name] 02/19/2023, 12:15 PM

## 2023-02-19 NOTE — Progress Notes (Signed)
    Durable Medical Equipment  (From admission, onward)           Start     Ordered   02/19/23 1311  For home use only DME Hospital bed  Once       Comments: Therapeutic mattress  Question Answer Comment  Length of Need Lifetime   Patient has (list medical condition): CHF   The above medical condition requires: Patient requires the ability to reposition frequently   Head must be elevated greater than: 30 degrees   Bed type Semi-electric      02/19/23 1312

## 2023-02-19 NOTE — Plan of Care (Signed)
  Problem: Activity: Goal: Risk for activity intolerance will decrease Outcome: Progressing   Problem: Nutrition: Goal: Adequate nutrition will be maintained Outcome: Progressing   Problem: Elimination: Goal: Will not experience complications related to urinary retention Outcome: Progressing   Problem: Pain Managment: Goal: General experience of comfort will improve Outcome: Progressing   Problem: Skin Integrity: Goal: Risk for impaired skin integrity will decrease Outcome: Progressing   

## 2023-02-20 ENCOUNTER — Inpatient Hospital Stay (HOSPITAL_COMMUNITY): Payer: 59

## 2023-02-20 DIAGNOSIS — I4891 Unspecified atrial fibrillation: Secondary | ICD-10-CM

## 2023-02-20 DIAGNOSIS — F322 Major depressive disorder, single episode, severe without psychotic features: Secondary | ICD-10-CM

## 2023-02-20 DIAGNOSIS — J9601 Acute respiratory failure with hypoxia: Secondary | ICD-10-CM | POA: Diagnosis not present

## 2023-02-20 DIAGNOSIS — I2510 Atherosclerotic heart disease of native coronary artery without angina pectoris: Secondary | ICD-10-CM | POA: Diagnosis not present

## 2023-02-20 DIAGNOSIS — I5043 Acute on chronic combined systolic (congestive) and diastolic (congestive) heart failure: Secondary | ICD-10-CM

## 2023-02-20 DIAGNOSIS — Z91199 Patient's noncompliance with other medical treatment and regimen due to unspecified reason: Secondary | ICD-10-CM

## 2023-02-20 LAB — BASIC METABOLIC PANEL
Anion gap: 9 (ref 5–15)
BUN: 22 mg/dL (ref 8–23)
CO2: 31 mmol/L (ref 22–32)
Calcium: 9 mg/dL (ref 8.9–10.3)
Chloride: 95 mmol/L — ABNORMAL LOW (ref 98–111)
Creatinine, Ser: 0.84 mg/dL (ref 0.44–1.00)
GFR, Estimated: >60 mL/min (ref >60)
Glucose, Bld: 112 mg/dL — ABNORMAL HIGH (ref 70–99)
Potassium: 3.7 mmol/L (ref 3.5–5.1)
Sodium: 135 mmol/L (ref 135–145)

## 2023-02-20 MED ORDER — DIGOXIN 0.25 MG/ML IJ SOLN
0.2500 mg | Freq: Four times a day (QID) | INTRAMUSCULAR | Status: AC
  Administered 2023-02-20 (×2): 0.25 mg via INTRAVENOUS
  Filled 2023-02-20 (×2): qty 2

## 2023-02-20 MED ORDER — FUROSEMIDE 40 MG PO TABS
80.0000 mg | ORAL_TABLET | Freq: Every day | ORAL | Status: DC
  Administered 2023-02-20: 80 mg via ORAL
  Filled 2023-02-20: qty 2

## 2023-02-20 MED ORDER — FUROSEMIDE 10 MG/ML IJ SOLN: 60.0000 mg | Freq: Once | INTRAMUSCULAR | Status: DC

## 2023-02-20 MED ORDER — FUROSEMIDE 10 MG/ML IJ SOLN
60.0000 mg | Freq: Two times a day (BID) | INTRAMUSCULAR | Status: DC
  Administered 2023-02-20 – 2023-02-21 (×3): 60 mg via INTRAVENOUS
  Filled 2023-02-20 (×3): qty 6

## 2023-02-20 MED ORDER — DULOXETINE HCL 30 MG PO CPEP
30.0000 mg | ORAL_CAPSULE | Freq: Every day | ORAL | Status: DC
  Administered 2023-02-20 – 2023-02-22 (×3): 30 mg via ORAL
  Filled 2023-02-20 (×3): qty 1

## 2023-02-20 MED ORDER — BUSPIRONE HCL 10 MG PO TABS
10.0000 mg | ORAL_TABLET | Freq: Two times a day (BID) | ORAL | Status: DC
  Administered 2023-02-20 – 2023-02-22 (×5): 10 mg via ORAL
  Filled 2023-02-20 (×5): qty 1

## 2023-02-20 MED ORDER — ARIPIPRAZOLE 5 MG PO TABS
5.0000 mg | ORAL_TABLET | Freq: Every day | ORAL | Status: DC
  Administered 2023-02-20 – 2023-02-22 (×3): 5 mg via ORAL
  Filled 2023-02-20 (×3): qty 1

## 2023-02-20 MED ORDER — DIGOXIN 0.25 MG/ML IJ SOLN
0.5000 mg | Freq: Once | INTRAMUSCULAR | Status: AC
  Administered 2023-02-20: 0.5 mg via INTRAVENOUS
  Filled 2023-02-20: qty 2

## 2023-02-20 NOTE — Consult Note (Signed)
[facility] Face-to-Face Psychiatry Consult   Reason for Consult:'' Depression, Patient not compliant to medications due to severe depression. She would like to speak with psychiatry in-house and then hopefully cooperate with her medical management.'' Referring Physician:  Dellie Burns, MD Patient Identification: Autumn [name] MRN:  [id number] Principal Diagnosis: Acute hypoxic respiratory failure (HCC) Diagnosis:  Principal Problem:   Acute hypoxic respiratory failure (HCC) Active Problems:   Depression   Atrial fibrillation with rapid ventricular response (HCC)   COPD (chronic obstructive pulmonary disease) (HCC)   Acute on chronic systolic heart failure (HCC)   Total Time spent with patient: 1 hour  Subjective:   Autumn [name] is a 67 y.o. female patient admitted with shortness of breath and worsening lower extremity edema.  HPI:   67 yo female with pmhx significant for arthritis, ADHD, hx drug abuse, fibromyalgia, HL, copd, depression, anxiety, paroxysmal afib on Xarelto, chf (EF 35-40% on 6/11) who was admitted for shortness of breath and worsening lower extremities edema. Patient is alert, oriented x 3, cooperative, calm and pleasant. She acknowledged not fully compliant with her medical medications but says she is compliant with her mental health medications(Abilify 10 mg, Buspar 10 mg bid and Duloxetine 60 mg daily) at home but are not prescribed since her current admission. Patient reports some depressive thought due to her medical issues and willing to be placed back on her mental health medications. She denies psychosis, delusions, suicidal/homicidal ideation, intent and plan.  Past Psychiatric History: as above  Risk to Self:  denies Risk to Others:  denies Prior Inpatient Therapy:   Prior Outpatient Therapy:  yes  Past Medical History:  Past Medical History:  Diagnosis Date   Anxiety    Arthritis    Attention deficit disorder (ADD)    COPD (chronic obstructive  pulmonary disease) (HCC)    Depression    Drug abuse (HCC)    Fibromyalgia    HFrEF (heart failure with reduced ejection fraction) (HCC)    Hyperlipidemia    Paroxysmal Atrial fibrillation     Past Surgical History:  Procedure Laterality Date   ANKLE SURGERY     FACIAL COSMETIC SURGERY     Family History: History reviewed. No pertinent family history. Family Psychiatric  History:  Social History:  Social History   Substance and Sexual Activity  Alcohol Use Not Currently   Comment: A fifth of vodka a day - none in a year     Social History   Substance and Sexual Activity  Drug Use Yes   Types: Cocaine, Marijuana   Comment: uses marijuana regularly; used cocaine " a long time ago"    Social History   Socioeconomic History   Marital status: Divorced    Spouse name: Not on file   Number of children: 3   Years of education: Not on file   Highest education level: High school graduate  Occupational History   Occupation: disabled  Tobacco Use   Smoking status: Every Day    Current packs/day: 0.50    Average packs/day: 0.5 packs/day for 20.0 years (10.0 ttl pk-yrs)    Types: Cigarettes   Smokeless tobacco: Never   Tobacco comments:    Half pack daily going to try patches  Vaping Use   Vaping status: Every Day   Substances: Nicotine, Flavoring  Substance and Sexual Activity   Alcohol use: Not Currently    Comment: A fifth of vodka a day - none in a year   Drug use: Yes  Types: Cocaine, Marijuana    Comment: uses marijuana regularly; used cocaine " a long time ago"   Sexual activity: Not on file  Other Topics Concern   Not on file  Social History Narrative   Not on file   Social Determinants of Health   Financial Resource Strain: Low Risk  (01/21/2023)   Overall Financial Resource Strain (CARDIA)    Difficulty of Paying Living Expenses: Not very hard  Food Insecurity: Patient Declined (02/18/2023)   Hunger Vital Sign    Worried About Running Out of Food in  the Last Year: Patient declined    Ran Out of Food in the Last Year: Patient declined  Transportation Needs: Patient Declined (02/18/2023)   PRAPARE - Administrator, Civil Service (Medical): Patient declined    Lack of Transportation (Non-Medical): Patient declined  Physical Activity: Unknown (01/30/2022)   Received from [facility], [facility]   Exercise Vital Sign    Days of Exercise per Week: 0 days    Minutes of Exercise per Session: Not on file  Stress: Stress Concern Present (01/30/2022)   Received from [facility], [facility]   [employer] of Occupational [facility] - Occupational Stress Questionnaire    Feeling of Stress : Very much  Social Connections: Unknown (02/05/2023)   Received from [facility], [facility]   Social Network    Social Network: Not on file   Additional Social History:    Allergies:   Allergies  Allergen Reactions   Pamelor [Nortriptyline Hcl] Anaphylaxis   Vibra-Tab [Doxycycline] Other (See Comments)    Unknown reaction    Labs:  Results for orders placed or performed during the hospital encounter of 02/17/23 (from the past 48 hour(s))  Basic metabolic panel     Status: Abnormal   Collection Time: 02/19/23  1:16 AM  Result Value Ref Range   Sodium 137 135 - 145 mmol/L   Potassium 4.3 3.5 - 5.1 mmol/L   Chloride 96 (L) 98 - 111 mmol/L   CO2 30 22 - 32 mmol/L   Glucose, Bld 155 (H) 70 - 99 mg/dL    Comment: Glucose reference range applies only to samples taken after fasting for at least 8 hours.   BUN 19 8 - 23 mg/dL   Creatinine, Ser 8.29 0.44 - 1.00 mg/dL   Calcium 9.3 8.9 - 56.2 mg/dL   GFR, Estimated >13 >08 mL/min    Comment: (NOTE) Calculated using the CKD-EPI Creatinine Equation (2021)    Anion gap 11 5 - 15    Comment: Performed at [facility], [address] [place], [place] [address]  Basic metabolic panel     Status: Abnormal   Collection Time: 02/20/23 12:40 AM  Result Value Ref Range    Sodium 135 135 - 145 mmol/L   Potassium 3.7 3.5 - 5.1 mmol/L   Chloride 95 (L) 98 - 111 mmol/L   CO2 31 22 - 32 mmol/L   Glucose, Bld 112 (H) 70 - 99 mg/dL    Comment: Glucose reference range applies only to samples taken after fasting for at least 8 hours.   BUN 22 8 - 23 mg/dL   Creatinine, Ser 6.96 0.44 - 1.00 mg/dL   Calcium 9.0 8.9 - 29.5 mg/dL   GFR, Estimated >28 >41 mL/min    Comment: (NOTE) Calculated using the CKD-EPI Creatinine Equation (2021)    Anion gap 9 5 - 15    Comment: Performed at [facility],  [address] [place], [place] [address]    Current Facility-Administered Medications  Medication Dose Route Frequency Provider Last Rate Last Admin   acetaminophen (TYLENOL) tablet 650 mg  650 mg Oral Q6H PRN Gwenevere Abbot, MD       Or   acetaminophen (TYLENOL) suppository 650 mg  650 mg Rectal Q6H PRN Gwenevere Abbot, MD       budesonide (PULMICORT) nebulizer solution 0.5 mg  0.5 mg Nebulization BID Gwenevere Abbot, MD   0.5 mg at 02/20/23 6045   buprenorphine-naloxone (SUBOXONE) 8-2 mg per SL tablet 1 tablet  1 tablet Sublingual TID Atway, Rayann N, DO   1 tablet at 02/20/23 0854   digoxin (LANOXIN) 0.25 MG/ML injection 0.25 mg  0.25 mg Intravenous Q6H Mallipeddi, Vishnu P, MD       furosemide (LASIX) injection 60 mg  60 mg Intravenous BID Mallipeddi, Vishnu P, MD       hydrOXYzine (ATARAX) tablet 25 mg  25 mg Oral TID PRN Atway, Rayann N, DO   25 mg at 02/19/23 0859   ipratropium (ATROVENT) nebulizer solution 0.5 mg  0.5 mg Nebulization Q6H Annett Fabian, MD   0.5 mg at 02/20/23 0842   losartan (COZAAR) tablet 25 mg  25 mg Oral Daily Perlie Gold, PA-C   25 mg at 02/20/23 0854   metoprolol succinate (TOPROL-XL) 24 hr tablet 200 mg  200 mg Oral Daily Atway, Rayann N, DO   200 mg at 02/20/23 0854   metoprolol tartrate (LOPRESSOR) injection 2.5 mg  2.5 mg Intravenous Q1H PRN Gwenevere Abbot, MD   2.5 mg at 02/18/23 1215   predniSONE (DELTASONE) tablet 40 mg  40 mg Oral Q  breakfast Atway, Rayann N, DO   40 mg at 02/20/23 4098   rivaroxaban (XARELTO) tablet 20 mg  20 mg Oral Q supper Atway, Rayann N, DO   20 mg at 02/19/23 1704   senna-docusate (Senokot-S) tablet 1 tablet  1 tablet Oral QHS PRN Gwenevere Abbot, MD        Musculoskeletal: Strength & Muscle Tone: within normal limits Gait & Station: unable to stand Patient leans: N/A    Psychiatric Specialty Exam:  Presentation  General Appearance:  Appropriate for Environment  Eye Contact: Good  Speech: Clear and Coherent  Speech Volume: Normal  Handedness: Right   Mood and Affect  Mood: Dysphoric  Affect: Appropriate   Thought Process  Thought Processes: Goal Directed; Linear  Descriptions of Associations:Intact  Orientation:Full (Time, Place and Person)  Thought Content:Logical  History of Schizophrenia/Schizoaffective disorder:No data recorded Duration of Psychotic Symptoms:No data recorded Hallucinations:Hallucinations: None  Ideas of Reference:None  Suicidal Thoughts:Suicidal Thoughts: No  Homicidal Thoughts:Homicidal Thoughts: No   Sensorium  Memory: Immediate Good; Recent Good; Remote Good  Judgment: Intact  Insight: Fair   Art therapist  Concentration: Good  Attention Span: Good  Recall: Good  Fund of Knowledge: Good  Language: Good   Psychomotor Activity  Psychomotor Activity: Psychomotor Activity: Normal   Assets  Assets: Communication Skills; Desire for Improvement   Sleep  Sleep:No data recorded  Physical Exam: Physical Exam Review of Systems  Psychiatric/Behavioral:  Negative for hallucinations, memory loss, substance abuse and suicidal ideas. The patient is not nervous/anxious and does not have insomnia.    Blood pressure (!) 113/94, pulse 94, temperature 98.4 F (36.9 C), temperature source Oral, resp. rate 20, height 5\' 5"  (1.651 m), weight 101 kg, SpO2 96%. Body mass index is 37.05 kg/m.  Treatment Plan  Summary: 66 y/o  female with history of mental illness and substance abuse who was stable on Abilify, Cymbalta and Buspirone prior to current admission. Patient reports some depression secondary to her medical issues but says she is willing to resume all medications including medical medications if that will help her get better.  Plan/Recommendations: -Re-start patient on Cymbalta 30 mg daily, Buspar 10 mg bid and Abilify 5 mg daily for depression and anxiety -Patient will benefit from referral to her outpatient psychiatrist upon discharge  Disposition: No evidence of imminent risk to self or others at present.   Patient does not meet criteria for psychiatric inpatient admission. Supportive therapy provided about ongoing stressors. Psychiatric consult service signing out. Re-consult as needed  Thedore Mins, MD 02/20/2023 1:22 PM

## 2023-02-20 NOTE — Progress Notes (Signed)
Occupational Therapy Treatment Patient Details Name: Autumn [name] MRN: [id number] DOB: 1955-09-01 Today's Date: 02/20/2023   History of present illness 66 y.o. presented with shortness of breath and worsening lower extremity edema and admitted for hypoxic respiratory failure.  PMH of atrial fibrillation, substance use disorder,  parkinson's disease, COPD, and systolic heart failure.   OT comments  Pt. Seen for continuation of energy conservation education with focus on PLB during tasks.  Energy conservation handouts provided and reviewed with pt.  Pt. Able to provide examples of ways she can slow pace, take breaks, and plan to have items within reach.  Still required cues for proper PLB tech.  But was able to verbalize how to perform it.  Pt. Reports "feeling anxious" since hospital admission.  Reviewed that the PLB is a great support with anxiety as feeling sob and other breathing difficulties can create and add to anxiety.  Pt. Was receptive to the education and also reports good family support at home.  Education complete and acute OT goals met.  Will alert OTR/L to sign off.     Recommendations for follow up therapy are one component of a multi-disciplinary discharge planning process, led by the attending physician.  Recommendations may be updated based on patient status, additional functional criteria and insurance authorization.    Assistance Recommended at Discharge Intermittent Supervision/Assistance  Patient can return home with the following  Assistance with cooking/housework;Direct supervision/assist for medications management;Direct supervision/assist for financial management;Assist for transportation   Equipment Recommendations  None recommended by OT    Recommendations for Other Services      Precautions / Restrictions Restrictions Weight Bearing Restrictions: No       Mobility Bed Mobility                    Transfers                          Balance                                           ADL either performed or assessed with clinical judgement   ADL Overall ADL's : At baseline                                       General ADL Comments: energy conseration handouts provided and reviewed with. pt. pt. receptive to recommendations and able to state examples herself.  cont. work on PLB.  greater difficulty maintaining closed mouth and blowing out.  reports multiple family members living with her that can help as needed.    Extremity/Trunk Assessment              Vision       Perception     Praxis      Cognition Arousal/Alertness: Awake/alert Behavior During Therapy: Agitated, WFL for tasks assessed/performed Overall Cognitive Status: History of cognitive impairments - at baseline                                 General Comments: fluctuations of agitation but able to calm and redirect pt.        Exercises      Shoulder Instructions  General Comments  Provided emotional support when pt. Expressed new feelings of anxiety.  Reviewed plb tech. And also tech. For listing and naming objects she sees around her to help redirect thoughts of anxiety and control those feelings.      Pertinent Vitals/ Pain       Pain Assessment Pain Assessment: No/denies pain  Home Living                                          Prior Functioning/Environment              Frequency  Min 1X/week        Progress Toward Goals  OT Goals(current goals can now be found in the care plan section)  Progress towards OT goals: Goals met/education completed, patient discharged from OT     Plan Discharge plan remains appropriate    Co-evaluation                 AM-PAC OT "6 Clicks" Daily Activity     Outcome Measure   Help from another person eating meals?: None Help from another person taking care of personal grooming?: None Help from  another person toileting, which includes using toliet, bedpan, or urinal?: None Help from another person bathing (including washing, rinsing, drying)?: None Help from another person to put on and taking off regular upper body clothing?: None Help from another person to put on and taking off regular lower body clothing?: None 6 Click Score: 24    End of Session        Activity Tolerance Patient tolerated treatment well   Patient Left in bed;with call bell/phone within reach   Nurse Communication          Time: 1610-9604 OT Time Calculation (min): 11 min  Charges: OT General Charges $OT Visit: 1 Visit OT Treatments $Self Care/Home Management : 8-22 mins  Boneta Lucks, COTA/L Acute Rehabilitation [phone]   Alessandra Bevels Lorraine-COTA/L 02/20/2023, 11:41 AM

## 2023-02-20 NOTE — Progress Notes (Signed)
HD#3 SUBJECTIVE:  Patient Summary: Autumn [name] is a 67 y.o. with a pertinent PMH of atrial fibrillation, opioid use disorder, COPD, and systolic heart failure who presented with shortness of breath and admitted for hypoxic respiratory failure.   Overnight Events: No acute events overnight  Interim History: Patient was evaluated at bedside. She endorsed continued improvement in her shortness of breath and breathing. However, she demonstrated shortness of breath during our assessment with no previous exertion. Patient did not have her [place] in place when entering the room as she stated she could not find it.   OBJECTIVE:  Vital Signs: Vitals:   02/19/23 1522 02/19/23 2002 02/20/23 0001 02/20/23 0500  BP: (!) 134/95 114/88 108/82 106/78  Pulse: (!) 123 (!) 167 87 97  Resp: 20  20 20   Temp: 98.7 F (37.1 C) 98.9 F (37.2 C) 98.6 F (37 C) 98.5 F (36.9 C)  TempSrc: Oral Oral Oral Oral  SpO2: 100% 96% 98% 93%  Weight:    101 kg  Height:       CMP     Component Value Date/Time   NA 135 02/20/2023 0040   K 3.7 02/20/2023 0040   CL 95 (L) 02/20/2023 0040   CO2 31 02/20/2023 0040   GLUCOSE 112 (H) 02/20/2023 0040   BUN 22 02/20/2023 0040   CREATININE 0.84 02/20/2023 0040   CALCIUM 9.0 02/20/2023 0040   PROT 6.2 (L) 01/18/2023 0935   ALBUMIN 2.7 (L) 01/18/2023 0935   AST 39 01/18/2023 0935   ALT 26 01/18/2023 0935   ALKPHOS 73 01/18/2023 0935   BILITOT 0.5 01/18/2023 0935   GFRNONAA >60 02/20/2023 0040   Supplemental O2: Nasal Cannula SpO2: 93 % O2 Flow Rate (L/min): 4 L/min FiO2 (%): 36 %  Filed Weights   02/18/23 0000 02/19/23 0500 02/20/23 0500  Weight: 104 kg 103.4 kg 101 kg    Intake/Output Summary (Last 24 hours) at 02/20/2023 0618 Last data filed at 02/20/2023 0532 Gross per 24 hour  Intake 720 ml  Output 1800 ml  Net -1080 ml   Net IO Since Admission: -6,030 mL [02/20/23 0618]  Physical Exam: Physical Exam HENT:     Head: Normocephalic and atraumatic.   Cardiovascular:     Rate and Rhythm: Tachycardia present.  Pulmonary:     Comments: Wheezing and shortness of breath at rest Musculoskeletal:     Right lower leg: Edema present.     Left lower leg: Edema present.  Neurological:     General: No focal deficit present.     Mental Status: She is alert.  Psychiatric:        Mood and Affect: Mood and affect normal.    ASSESSMENT/PLAN:  Assessment: Principal Problem:   Acute hypoxic respiratory failure (HCC) Active Problems:   Atrial fibrillation with rapid ventricular response (HCC)   COPD (chronic obstructive pulmonary disease) (HCC)   Acute on chronic systolic heart failure (HCC)  Plan: #Acute hypoxic respiratory failure #History of COPD #History of chronic systolic heart failure Volume status has improved.  Continues to 4 L per nasal cannula but will attempt to wean to 3 L.  She was short of breath at rest this morning but was not on any oxygen as her nasal cannula fell off.  Yesterday, her urine output was 1.8 L with net -1 L. We will continue to treat COPD exacerbations with prednisone (today is day 3 of 5) and bronchodilators.  Albuterol avoided given her atrial fibrillation with RVR.  Prolonged  QT continues to make her a bad candidate for amiodarone.  Decreased EF also makes her a poor candidate for diltiazem. -Continue prednisone 40 mg -Continue losartan 25 mg -Increase IV Lasix from 40 mg to 60 mg twice daily -Continue budesonide and ipratropium -Continue PT/OT -Resume Farxiga 10 mg  #Atrial fibrillation with RVR Tachycardia improved with heart rate the 100s to 110s with 1 episode of tachycardia at 162 last night.  Lopressor was not been needed yesterday.  Patient continues to deny any associated symptoms.  Her medication management will continue to be adjusted to avoid taking medication at more than 1 time per day. -Continue Toprol-XL 200 mg -Continue Xarelto 20 mg -Administer IV digoxin loading dose w/ 0.5 mg now  followed by 0.25 mg every 6 hours for 2 doses  #Opioid use disorder History of opioid use disorder treated by Suboxone. -Suboxone 8-2 mg 1 tablet 3 times daily  #Anxiety Patient has history of anxiety that has been exacerbated by her current hospitalization. -Continue hydroxyzine 25 mg 3 times daily as needed -Psychiatry consulted  Best Practice: Diet: Cardiac diet VTE: Xarelto Code: Full Therapy Recs:  Home , resuming outpatient PT Family Contact: Granddaughter DISPO: Anticipated discharge to Home pending  resolution of symptoms and medication management .  Signature: Morrie Sheldon, MD Internal Medicine Resident, PGY-1 [facility]  Pager: [phone]  Please contact the on call pager after 5 pm and on weekends at [phone].

## 2023-02-20 NOTE — Progress Notes (Signed)
Progress Note  Patient Name: Autumn [name] Date of Encounter: 02/20/2023  Primary Cardiologist: Lavona Mound Tobb, DO  Subjective   No acute events overnight. Continues to be in atrial fibrillation with RVR with HR as high as 140s but usually ranging between 120 and 140s.  She continues to have SOB but no leg swelling.  She says she is severely depressed and does not know what is wrong with her.  Inpatient Medications    Scheduled Meds:  budesonide (PULMICORT) nebulizer solution  0.5 mg Nebulization BID   buprenorphine-naloxone  1 tablet Sublingual TID   digoxin  0.25 mg Intravenous Q6H   digoxin  0.5 mg Intravenous Once   furosemide  60 mg Intravenous BID   ipratropium  0.5 mg Nebulization Q6H   losartan  25 mg Oral Daily   metoprolol succinate  200 mg Oral Daily   predniSONE  40 mg Oral Q breakfast   rivaroxaban  20 mg Oral Q supper   Continuous Infusions:  PRN Meds: acetaminophen **OR** acetaminophen, hydrOXYzine, metoprolol tartrate, senna-docusate   Vital Signs    Vitals:   02/20/23 0001 02/20/23 0500 02/20/23 0806 02/20/23 0842  BP: 108/82 106/78 (!) 113/94   Pulse: 87 97 94   Resp: 20 20    Temp: 98.6 F (37 C) 98.5 F (36.9 C) 98.4 F (36.9 C)   TempSrc: Oral Oral Oral   SpO2: 98% 93%  96%  Weight:  101 kg    Height:        Intake/Output Summary (Last 24 hours) at 02/20/2023 0954 Last data filed at 02/20/2023 0900 Gross per 24 hour  Intake 840 ml  Output 2000 ml  Net -1160 ml   Filed Weights   02/18/23 0000 02/19/23 0500 02/20/23 0500  Weight: 104 kg 103.4 kg 101 kg    Telemetry     Personally reviewed, A-fib with RVR, HR 120-140s  ECG    Not performed today  Physical Exam   GEN: No acute distress.   Neck: No JVD. Cardiac: RRR, no murmur, rub, or gallop.  Respiratory: Nonlabored. Clear to auscultation bilaterally. GI: Soft, nontender, bowel sounds present. MS: No edema; No deformity. Neuro:  Nonfocal. Psych: Alert and oriented x 3.  Normal affect.  Labs    Chemistry Recent Labs  Lab 02/18/23 0155 02/19/23 0116 02/20/23 0040  NA 135 137 135  K 4.1 4.3 3.7  CL 100 96* 95*  CO2 25 30 31   GLUCOSE 128* 155* 112*  BUN 8 19 22   CREATININE 0.65 0.93 0.84  CALCIUM 8.9 9.3 9.0  GFRNONAA >60 >60 >60  ANIONGAP 10 11 9      Hematology Recent Labs  Lab 02/17/23 0845 02/17/23 1144 02/17/23 1415 02/18/23 0155  WBC 9.8  --   --  8.8  RBC 3.79*  --   --  4.39  HGB 10.5* 11.6* 11.9* 11.7*  HCT 33.9* 34.0* 35.0* 38.3  MCV 89.4  --   --  87.2  MCH 27.7  --   --  26.7  MCHC 31.0  --   --  30.5  RDW 17.2*  --   --  17.0*  PLT 303  --   --  331    Cardiac Enzymes Recent Labs  Lab 02/17/23 0845 02/17/23 1022  TROPONINIHS 17 15    BNP Recent Labs  Lab 02/17/23 0845  BNP 1,231.2*     DDimerNo results for input(s): "DDIMER" in the last 168 hours.   Radiology  No results found.   Assessment & Plan   # Persistent atrial fibrillation with RVR # Acute on chronic systolic heart failure exacerbation LVEF 35 to 40% -Presented with DOE and worsening bilateral lower extremity swelling.  BNP is elevated 1231.  Chest x-ray showed pulmonary edema. She was admitted to hospitalist team for the management of acute hypoxic respiratory failure secondary to acute on chronic systolic heart failure exacerbation and persistent A-fib with RVR on p.o. rate controlling medications. Patient was also managed for COPD exacerbation with steroids and nebulizers. -On IV Lasix 40 mg twice daily with 1.8L urine output in the last 24 hours with net -1 L. Patient continues to have DOE with swelling in the legs significantly improved. Increase IV Lasix from 40 mg to 60 mg twice daily. -Continue metoprolol succinate 200 mg once daily and administer IV digoxin loading dose with 0.5 mg now followed by 0.25 mg every 6 hours for 2 doses. Not a good candidate for rhythm control including DCCV due to noncompliance and drug use (U tox was not  performed this admission but 2 months ago was positive for amphetamines, tetrahydrocannabinol). -Patient is severely depressed and says she does not know what is wrong with her. Placed psychiatry consult in-house.  # CAD s/p LAD PCI in 2020 -No chest pains, continue cardioprotective medications with high intensity statin.  # Moderate to severe MR in 6/24 -GDMT optimization but patient not compliant.    Signed, Marjo Bicker, MD  02/20/2023, 9:54 AM

## 2023-02-21 ENCOUNTER — Inpatient Hospital Stay (HOSPITAL_COMMUNITY): Payer: 59

## 2023-02-21 DIAGNOSIS — I5043 Acute on chronic combined systolic (congestive) and diastolic (congestive) heart failure: Secondary | ICD-10-CM | POA: Diagnosis not present

## 2023-02-21 DIAGNOSIS — I2510 Atherosclerotic heart disease of native coronary artery without angina pectoris: Secondary | ICD-10-CM | POA: Diagnosis not present

## 2023-02-21 DIAGNOSIS — J9601 Acute respiratory failure with hypoxia: Secondary | ICD-10-CM | POA: Diagnosis not present

## 2023-02-21 DIAGNOSIS — Z8679 Personal history of other diseases of the circulatory system: Secondary | ICD-10-CM

## 2023-02-21 DIAGNOSIS — I4891 Unspecified atrial fibrillation: Secondary | ICD-10-CM | POA: Diagnosis not present

## 2023-02-21 LAB — CBC
HCT: 35.3 % — ABNORMAL LOW (ref 36.0–46.0)
Hemoglobin: 10.8 g/dL — ABNORMAL LOW (ref 12.0–15.0)
MCH: 26.5 pg (ref 26.0–34.0)
MCHC: 30.6 g/dL (ref 30.0–36.0)
MCV: 86.5 fL (ref 80.0–100.0)
Platelets: 292 10*3/uL (ref 150–400)
RBC: 4.08 MIL/uL (ref 3.87–5.11)
RDW: 16.1 % — ABNORMAL HIGH (ref 11.5–15.5)
WBC: 9.8 10*3/uL (ref 4.0–10.5)
nRBC: 0 % (ref 0.0–0.2)

## 2023-02-21 LAB — BASIC METABOLIC PANEL
Anion gap: 8 (ref 5–15)
BUN: 27 mg/dL — ABNORMAL HIGH (ref 8–23)
CO2: 35 mmol/L — ABNORMAL HIGH (ref 22–32)
Calcium: 8.8 mg/dL — ABNORMAL LOW (ref 8.9–10.3)
Chloride: 94 mmol/L — ABNORMAL LOW (ref 98–111)
Creatinine, Ser: 1.03 mg/dL — ABNORMAL HIGH (ref 0.44–1.00)
GFR, Estimated: 60 mL/min — ABNORMAL LOW (ref >60)
Glucose, Bld: 118 mg/dL — ABNORMAL HIGH (ref 70–99)
Potassium: 4.2 mmol/L (ref 3.5–5.1)
Sodium: 137 mmol/L (ref 135–145)

## 2023-02-21 MED ORDER — SALINE SPRAY 0.65 % NA SOLN
1.0000 | NASAL | Status: DC | PRN
  Filled 2023-02-21: qty 44

## 2023-02-21 MED ORDER — AZITHROMYCIN 250 MG PO TABS
500.0000 mg | ORAL_TABLET | Freq: Every day | ORAL | Status: DC
  Administered 2023-02-21 – 2023-02-22 (×2): 500 mg via ORAL
  Filled 2023-02-21 (×2): qty 2

## 2023-02-21 MED ORDER — DIGOXIN 125 MCG PO TABS
0.2500 mg | ORAL_TABLET | Freq: Every day | ORAL | Status: DC
  Administered 2023-02-21 – 2023-02-22 (×2): 0.25 mg via ORAL
  Filled 2023-02-21 (×2): qty 2

## 2023-02-21 NOTE — Progress Notes (Signed)
Progress Note  Patient Name: Autumn [name] Date of Encounter: 02/21/2023  Primary Cardiologist: Lavona Mound Tobb, DO  Subjective   No acute events overnight, telemetry reviewed, HR ranged between 90-100s, in atrial fibrillation (significantly improved from yesterday from 120-140s).  She still reports SOB although she was able to lay flat.  She agreed to take medications upon discharge.  Inpatient Medications    Scheduled Meds:  ARIPiprazole  5 mg Oral Daily   budesonide (PULMICORT) nebulizer solution  0.5 mg Nebulization BID   buprenorphine-naloxone  1 tablet Sublingual TID   busPIRone  10 mg Oral BID   DULoxetine  30 mg Oral Daily   furosemide  60 mg Intravenous BID   ipratropium  0.5 mg Nebulization Q6H   losartan  25 mg Oral Daily   metoprolol succinate  200 mg Oral Daily   rivaroxaban  20 mg Oral Q supper   Continuous Infusions:  PRN Meds: acetaminophen **OR** acetaminophen, hydrOXYzine, metoprolol tartrate, senna-docusate   Vital Signs    Vitals:   02/21/23 0000 02/21/23 0400 02/21/23 0745 02/21/23 0811  BP:      Pulse:  97 98 97  Resp:  13 19 15   Temp: 97.7 F (36.5 C) 97.7 F (36.5 C) 98.8 F (37.1 C)   TempSrc: Axillary Oral Oral   SpO2:  98%    Weight:      Height:        Intake/Output Summary (Last 24 hours) at 02/21/2023 0954 Last data filed at 02/21/2023 0900 Gross per 24 hour  Intake 686 ml  Output 3960 ml  Net -3274 ml   Filed Weights   02/18/23 0000 02/19/23 0500 02/20/23 0500  Weight: 104 kg 103.4 kg 101 kg    Telemetry     Personally reviewed, A-fib with RVR, HR 90-100s  ECG    Not performed today  Physical Exam   GEN: No acute distress.   Neck: No JVD. Cardiac: RRR, no murmur, rub, or gallop.  Respiratory: Nonlabored. Clear to auscultation bilaterally. GI: Soft, nontender, bowel sounds present. MS: No edema; No deformity. Neuro:  Nonfocal. Psych: Alert and oriented x 3. Normal affect.  Labs    Chemistry Recent Labs   Lab 02/19/23 0116 02/20/23 0040 02/21/23 0046  NA 137 135 137  K 4.3 3.7 4.2  CL 96* 95* 94*  CO2 30 31 35*  GLUCOSE 155* 112* 118*  BUN 19 22 27*  CREATININE 0.93 0.84 1.03*  CALCIUM 9.3 9.0 8.8*  GFRNONAA >60 >60 60*  ANIONGAP 11 9 8      Hematology Recent Labs  Lab 02/17/23 0845 02/17/23 1144 02/17/23 1415 02/18/23 0155 02/21/23 0911  WBC 9.8  --   --  8.8 9.8  RBC 3.79*  --   --  4.39 4.08  HGB 10.5*   < > 11.9* 11.7* 10.8*  HCT 33.9*   < > 35.0* 38.3 35.3*  MCV 89.4  --   --  87.2 86.5  MCH 27.7  --   --  26.7 26.5  MCHC 31.0  --   --  30.5 30.6  RDW 17.2*  --   --  17.0* 16.1*  PLT 303  --   --  331 292   < > = values in this interval not displayed.    Cardiac Enzymes Recent Labs  Lab 02/17/23 0845 02/17/23 1022  TROPONINIHS 17 15    BNP Recent Labs  Lab 02/17/23 0845  BNP 1,231.2*     DDimerNo results for  input(s): "DDIMER" in the last 168 hours.   Radiology    DG Chest 2 View  Result Date: 02/21/2023 CLINICAL DATA:  67 year old female with history of shortness of breath. EXAM: CHEST - 2 VIEW COMPARISON:  Chest x-ray 02/20/2023. FINDINGS: Image is under penetrated, limiting the diagnostic sensitivity and specificity of the examination. With these limitations in mind, lung volumes appear normal. Mild bibasilar opacities are favored to reflect areas of subsegmental atelectasis. Probable trace bilateral pleural effusions. No pneumothorax. Resolution of pulmonary edema with persistent pulmonary venous congestion. Moderate cardiomegaly. Upper mediastinal contours are within normal limits. Atherosclerotic calcifications in the thoracic aorta. IMPRESSION: 1. Resolving congestive heart failure, as above. 2. Aortic atherosclerosis. Electronically Signed   By: Trudie Reed M.D.   On: 02/21/2023 09:44   DG CHEST PORT 1 VIEW  Result Date: 02/20/2023 CLINICAL DATA:  Congestive heart failure. EXAM: PORTABLE CHEST 1 VIEW COMPARISON:  Chest x-ray dated February 17, 2023. FINDINGS: Cardiomegaly, diffuse interstitial thickening, and small bilateral pleural effusions with bibasilar atelectasis, similar to prior study. No pneumothorax. No acute osseous abnormality. IMPRESSION: 1. Unchanged congestive heart failure. Electronically Signed   By: Obie Dredge M.D.   On: 02/20/2023 14:23     Assessment & Plan   # Persistent atrial fibrillation with RVR # Acute on chronic systolic heart failure exacerbation LVEF 35 to 40% -Presented with DOE and worsening bilateral lower extremity swelling.  BNP is elevated 1231. Chest x-ray showed pulmonary edema. She was admitted to hospitalist team for the management of acute hypoxic respiratory failure secondary to volume overload from acute on chronic systolic heart failure exacerbation and persistent A-fib with RVR. Patient was also managed for COPD exacerbation with steroids and nebulizers. -Initially on IV Lasix 40 mg twice daily with 1.8L urine output on 02/19/2023 but after increasing IV Lasix dose from 40 mg to 60 mg twice daily yesterday, she made 3.8 L urine in the last 24 hours with net -3 L. Serum creatinine mildly elevated from 0.8-1.03 with normal GFR.  She reports no improvement in SOB the although she was laying flat and resting.  Will continue IV Lasix 60 mg twice daily for an additional day and switch to p.o. Lasix. -Continue metoprolol succinate 200 mg once daily, received IV digoxin loading dose on 02/20/2023 with adequate HR control.  HR ranging between 90 and 100s on telemetry, in atrial fibrillation. Will start maintenance dose of p.o. digoxin 0.25 mg once daily. Continue Xarelto 20 mg daily with supper. Patient was seen my psychiatry yesterday for severe depression and now she agrees to take all the medications on discharge.  For now, we will continue above rate controlling medications and see her in the clinic in 2 weeks to ensure medication compliance and then set her up for DCCV. She will need to have U tox on  the day of DCCV. 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  # CAD s/p LAD PCI in 2020 -No angina, continue cardioprotective medications with high intensity statin.  # Moderate to severe MR in 6/24 -GDMT optimization but patient not compliant. Will see her back in the clinic in 2 weeks as she agreed to take medications now after seeing psychiatry yesterday.    Signed, Marjo Bicker, MD  02/21/2023, 9:54 AM

## 2023-02-21 NOTE — TOC Progression Note (Signed)
Transition of Care [facility]) - Progression Note    Patient Details  Name: Autumn [name] MRN: [id number] Date of Birth: [birth year]  Transition of [facility]) CM/SW Contact  Ronny Bacon, RN Phone Number: 02/21/2023, 3:33 PM  Clinical Narrative:  Patient requested bariatric rollator, ordered through [facility].      Expected Discharge Plan: Home w Home Health Services Barriers to Discharge: Continued Medical Work up  Expected Discharge Plan and Services In-house Referral: NA Discharge Planning Services: CM Consult Post Acute Care Choice: NA Living arrangements for the past 2 months: Single Family Home                 DME Arranged: N/A DME Agency: NA       HH Arranged: NA           Social Determinants of Health (SDOH) Interventions SDOH Screenings   Food Insecurity: Patient Declined (02/18/2023)  Housing: Patient Declined (02/18/2023)  Transportation Needs: Patient Declined (02/18/2023)  Utilities: At Risk (10/13/2022)   Received from [facility], [facility]  Alcohol Screen: Low Risk  (01/21/2023)  Financial Resource Strain: Low Risk  (01/21/2023)  Physical Activity: Unknown (01/30/2022)   Received from [facility], [facility]  Social Connections: Unknown (02/05/2023)   Received from [facility], [facility]  Stress: Stress Concern Present (01/30/2022)   Received from [facility], [facility]  Tobacco Use: High Risk (02/17/2023)    Readmission Risk Interventions    01/21/2023    1:29 PM 12/07/2022    1:27 PM  Readmission Risk Prevention Plan  Transportation Screening Complete Complete  PCP or Specialist Appt within 3-5 Days  Complete  HRI or Home Care Consult  Complete  Social Work Consult for Recovery Care Planning/Counseling  Complete  Palliative Care Screening  Not Applicable  Medication Review Oceanographer) Complete Complete  PCP or Specialist appointment within 3-5 days of discharge Complete   HRI or Home Care Consult Complete    SW Recovery Care/Counseling Consult Complete   Palliative Care Screening Not Applicable   Skilled Nursing Facility Not Applicable

## 2023-02-21 NOTE — Progress Notes (Addendum)
Mobility Specialist Progress Note    02/21/23 1101  Mobility  Activity Ambulated with assistance in hallway  Level of Assistance Standby assist, set-up cues, supervision of patient - no hands on  Assistive Device Four wheel walker  Distance Ambulated (ft) 250 ft  Activity Response Tolerated well  Mobility Referral Yes  $Mobility charge 1 Mobility  Mobility Specialist Start Time (ACUTE ONLY) 1042  Mobility Specialist Stop Time (ACUTE ONLY) 1058  Mobility Specialist Time Calculation (min) (ACUTE ONLY) 16 min   Pre-Mobility: 107 HR During Mobility: 124 HR Post-Mobility: 98 HR  Pt received in chair and agreeable. C/o not getting sleep and being tired. Pt was SOB with exertion. Returned to bed with call bell in reach.   [place] Mobility Specialist  Please Neurosurgeon or Rehab Office at [phone]

## 2023-02-21 NOTE — Care Management (Cosign Needed)
    Durable Medical Equipment  (From admission, onward)           Start     Ordered   02/21/23 1537  For home use only DME 4 wheeled rolling walker with seat  Once       Comments: Bariatric  Question:  Patient needs a walker to treat with the following condition  Answer:  Weakness   02/21/23 1536   02/19/23 1311  For home use only DME Hospital bed  Once       Comments: Therapeutic mattress  Question Answer Comment  Length of Need Lifetime   Patient has (list medical condition): CHF   The above medical condition requires: Patient requires the ability to reposition frequently   Head must be elevated greater than: 30 degrees   Bed type Semi-electric      02/19/23 1312

## 2023-02-21 NOTE — Progress Notes (Addendum)
HD#4 SUBJECTIVE:  Patient Summary: Autumn [name] is a 67 y.o. with a pertinent PMH of atrial fibrillation, opioid use disorder, COPD, and systolic heart failure who presented with shortness of breath and admitted for hypoxic respiratory failure.   Overnight Events: No acute events overnight.  Interim History: Patient was evaluated at bedside.  She noted improvement in breathing but remains visibly short of breath at rest.  She has developed a productive cough that was noticeable at bedside.  She continues to require 4 L of nasal cannula oxygen.  OBJECTIVE:  Vital Signs: Vitals:   02/20/23 1700 02/20/23 2118 02/21/23 0000 02/21/23 0400  BP: (!) 114/91     Pulse: 70 (!) 104  97  Resp: 20   13  Temp:  97.6 F (36.4 C) 97.7 F (36.5 C) 97.7 F (36.5 C)  TempSrc:  Axillary Axillary Oral  SpO2: 92%   98%  Weight:      Height:       CMP     Component Value Date/Time   NA 137 02/21/2023 0046   K 4.2 02/21/2023 0046   CL 94 (L) 02/21/2023 0046   CO2 35 (H) 02/21/2023 0046   GLUCOSE 118 (H) 02/21/2023 0046   BUN 27 (H) 02/21/2023 0046   CREATININE 1.03 (H) 02/21/2023 0046   CALCIUM 8.8 (L) 02/21/2023 0046   PROT 6.2 (L) 01/18/2023 0935   ALBUMIN 2.7 (L) 01/18/2023 0935   AST 39 01/18/2023 0935   ALT 26 01/18/2023 0935   ALKPHOS 73 01/18/2023 0935   BILITOT 0.5 01/18/2023 0935   GFRNONAA 60 (L) 02/21/2023 0046   Supplemental O2: Nasal Cannula SpO2: 98 % O2 Flow Rate (L/min): 4 L/min FiO2 (%): 36 %  Filed Weights   02/18/23 0000 02/19/23 0500 02/20/23 0500  Weight: 104 kg 103.4 kg 101 kg    Intake/Output Summary (Last 24 hours) at 02/21/2023 0635 Last data filed at 02/20/2023 2005 Gross per 24 hour  Intake 806 ml  Output 3860 ml  Net -3054 ml   Net IO Since Admission: -9,084 mL [02/21/23 0635]  Physical Exam Constitutional:      Appearance: Normal appearance.  HENT:     Head: Normocephalic and atraumatic.  Cardiovascular:     Rate and Rhythm: Normal rate.  Rhythm irregular.  Pulmonary:     Breath sounds: Wheezing and rhonchi present.  Musculoskeletal:     Right lower leg: 1+ Pitting Edema present.     Left lower leg: 1+ Pitting Edema present.  Skin:    General: Skin is warm.  Neurological:     General: No focal deficit present.  Psychiatric:        Mood and Affect: Mood and affect normal.        Behavior: Behavior is cooperative.    ASSESSMENT/PLAN:  Assessment: Principal Problem:   Acute hypoxic respiratory failure (HCC) Active Problems:   Depression   Atrial fibrillation with rapid ventricular response (HCC)   COPD (chronic obstructive pulmonary disease) (HCC)   Acute on chronic systolic heart failure (HCC)  Plan: #Acute hypoxic respiratory failure #History of COPD #History of chronic systolic heart failure Volume status continues to improve.  Yesterday, her net output was -3 L.  She continues to have mild bilateral pitting edema. IV Lasix 60 mg twice daily will be continued for an additional day and switched to oral Lasix tomorrow.  However, she continues to be visibly short of breath at rest (even on 4 L of O2 [place]) with rhonchi and  wheezing. We will continue to treat a COPD exacerbation with prednisone and bronchodilators.  Her prednisone duration was extended to 7 days, and she is currently on day 4. Chest x-ray was ordered to rule out pneumonia, and it showed resolving congestive heart failure.  She will begin 3 days of 500 mg daily azithromycin for COPD exacerbation. CBC was also ordered to investigate possible leukocytosis, but her white blood cell count was normal at 9.8.     -Continue prednisone 40 mg (Day 4/7) -Continue losartan 25 mg -IV Lasix 60 mg twice daily and switch to oral dosage tomorrow -Continue budesonide and ipratropium -Continue PT/OT -Begin azithromycin 500 mg daily for 3 doses  #Atrial fibrillation with RVR Tachycardia improved with heart rate in the 90s to 100s patient and remains in A-fib. Prolonged QT  continues to make her a bad candidate for amiodarone. Decreased EF also makes her a poor candidate for diltiazem. She will be given her oral digoxin maintenance dose of 0.25 mg daily.  -Continue Toprol-XL 200 mg -Continue Xarelto 20 mg -Begin maintenance dose digoxin 0.25 mg daily  #Opioid use disorder History of opioid use disorder treated by Suboxone. -Continue Suboxone 8-2 mg 1 tablet 3 times daily  #Anxiety Patient has history of anxiety that has been exacerbated by her current hospitalization.  Evaluated by psychiatry yesterday and restarted on Cymbalta, BuSpar, and Abilify daily for depression and anxiety. -Continue hydroxyzine 25 mg 3 times daily as needed -Resume Buspar 10 mg BID, Abilify 5 mg, Cymbalta 30 mg daily  Best Practice: Diet: Cardiac diet VTE: Xarelto Code: Full Therapy Recs:  Home , resuming outpatient PT Family Contact: Granddaughter DISPO: Anticipated discharge to Home pending  resolution of symptoms and medication management .  Signature: Morrie Sheldon, MD Internal Medicine Resident, PGY-1 [facility]  Pager: [phone]  Please contact the on call pager after 5 pm and on weekends at [phone].

## 2023-02-22 ENCOUNTER — Other Ambulatory Visit (HOSPITAL_COMMUNITY): Payer: Self-pay

## 2023-02-22 DIAGNOSIS — I5043 Acute on chronic combined systolic (congestive) and diastolic (congestive) heart failure: Secondary | ICD-10-CM | POA: Diagnosis not present

## 2023-02-22 DIAGNOSIS — I4819 Other persistent atrial fibrillation: Secondary | ICD-10-CM | POA: Diagnosis not present

## 2023-02-22 DIAGNOSIS — I509 Heart failure, unspecified: Secondary | ICD-10-CM

## 2023-02-22 DIAGNOSIS — I480 Paroxysmal atrial fibrillation: Secondary | ICD-10-CM

## 2023-02-22 DIAGNOSIS — J9601 Acute respiratory failure with hypoxia: Secondary | ICD-10-CM | POA: Diagnosis not present

## 2023-02-22 LAB — CBC
HCT: 39.4 % (ref 36.0–46.0)
Hemoglobin: 11.9 g/dL — ABNORMAL LOW (ref 12.0–15.0)
MCH: 26.2 pg (ref 26.0–34.0)
MCHC: 30.2 g/dL (ref 30.0–36.0)
MCV: 86.8 fL (ref 80.0–100.0)
Platelets: 317 10*3/uL (ref 150–400)
RBC: 4.54 MIL/uL (ref 3.87–5.11)
RDW: 15.9 % — ABNORMAL HIGH (ref 11.5–15.5)
WBC: 10.5 10*3/uL (ref 4.0–10.5)
nRBC: 0 % (ref 0.0–0.2)

## 2023-02-22 LAB — BASIC METABOLIC PANEL
Anion gap: 16 — ABNORMAL HIGH (ref 5–15)
BUN: 25 mg/dL — ABNORMAL HIGH (ref 8–23)
CO2: 35 mmol/L — ABNORMAL HIGH (ref 22–32)
Calcium: 9.3 mg/dL (ref 8.9–10.3)
Chloride: 88 mmol/L — ABNORMAL LOW (ref 98–111)
Creatinine, Ser: 1.03 mg/dL — ABNORMAL HIGH (ref 0.44–1.00)
GFR, Estimated: 60 mL/min — ABNORMAL LOW (ref >60)
Glucose, Bld: 94 mg/dL (ref 70–99)
Potassium: 4.5 mmol/L (ref 3.5–5.1)
Sodium: 139 mmol/L (ref 135–145)

## 2023-02-22 MED ORDER — LOSARTAN POTASSIUM 25 MG PO TABS
25.0000 mg | ORAL_TABLET | Freq: Every day | ORAL | 0 refills | Status: DC
  Filled 2023-02-22: qty 30, 30d supply, fill #0

## 2023-02-22 MED ORDER — DULOXETINE HCL 30 MG PO CPEP
30.0000 mg | ORAL_CAPSULE | Freq: Every day | ORAL | 0 refills | Status: DC
  Filled 2023-02-22: qty 17, 17d supply, fill #0

## 2023-02-22 MED ORDER — PREDNISONE 20 MG PO TABS
40.0000 mg | ORAL_TABLET | Freq: Every day | ORAL | 0 refills | Status: AC
  Filled 2023-02-22: qty 2, 1d supply, fill #0

## 2023-02-22 MED ORDER — RIVAROXABAN 20 MG PO TABS
20.0000 mg | ORAL_TABLET | Freq: Every day | ORAL | 0 refills | Status: DC
  Filled 2023-02-22: qty 30, 30d supply, fill #0

## 2023-02-22 MED ORDER — BUSPIRONE HCL 10 MG PO TABS
10.0000 mg | ORAL_TABLET | Freq: Two times a day (BID) | ORAL | 0 refills | Status: AC
  Filled 2023-02-22: qty 60, 30d supply, fill #0

## 2023-02-22 MED ORDER — PREDNISONE 20 MG PO TABS
40.0000 mg | ORAL_TABLET | Freq: Every day | ORAL | Status: DC
  Administered 2023-02-22: 40 mg via ORAL
  Filled 2023-02-22: qty 2

## 2023-02-22 MED ORDER — FUROSEMIDE 40 MG PO TABS
80.0000 mg | ORAL_TABLET | Freq: Every day | ORAL | 0 refills | Status: DC
  Filled 2023-02-22: qty 60, 30d supply, fill #0

## 2023-02-22 MED ORDER — ARIPIPRAZOLE 5 MG PO TABS
5.0000 mg | ORAL_TABLET | Freq: Every day | ORAL | 0 refills | Status: DC
  Filled 2023-02-22: qty 30, 30d supply, fill #0

## 2023-02-22 MED ORDER — DAPAGLIFLOZIN PROPANEDIOL 10 MG PO TABS
10.0000 mg | ORAL_TABLET | Freq: Every day | ORAL | Status: DC
  Administered 2023-02-22: 10 mg via ORAL
  Filled 2023-02-22: qty 1

## 2023-02-22 MED ORDER — FUROSEMIDE 80 MG PO TABS
80.0000 mg | ORAL_TABLET | Freq: Every day | ORAL | 1 refills | Status: DC
  Filled 2023-02-22: qty 30, 30d supply, fill #0

## 2023-02-22 MED ORDER — DIGOXIN 250 MCG PO TABS
0.2500 mg | ORAL_TABLET | Freq: Every day | ORAL | 0 refills | Status: DC
  Filled 2023-02-22: qty 30, 30d supply, fill #0

## 2023-02-22 MED ORDER — BUPRENORPHINE HCL-NALOXONE HCL 8-2 MG SL FILM
1.0000 | ORAL_FILM | Freq: Three times a day (TID) | SUBLINGUAL | 0 refills | Status: AC
  Filled 2023-02-22: qty 42, 14d supply, fill #0

## 2023-02-22 MED ORDER — METOPROLOL SUCCINATE ER 200 MG PO TB24
200.0000 mg | ORAL_TABLET | Freq: Every day | ORAL | 0 refills | Status: DC
  Filled 2023-02-22: qty 30, 30d supply, fill #0

## 2023-02-22 MED ORDER — DAPAGLIFLOZIN PROPANEDIOL 10 MG PO TABS
10.0000 mg | ORAL_TABLET | Freq: Every day | ORAL | 1 refills | Status: DC
  Filled 2023-02-22: qty 30, 30d supply, fill #0

## 2023-02-22 MED ORDER — FUROSEMIDE 40 MG PO TABS
80.0000 mg | ORAL_TABLET | Freq: Every day | ORAL | Status: DC
  Administered 2023-02-22: 80 mg via ORAL
  Filled 2023-02-22: qty 2

## 2023-02-22 MED ORDER — AZITHROMYCIN 250 MG PO TABS
ORAL_TABLET | ORAL | 0 refills | Status: DC
  Filled 2023-02-22: qty 2, 1d supply, fill #0

## 2023-02-22 NOTE — Discharge Summary (Addendum)
Name: Autumn [name] MRN: [id number] DOB: 04-Aug-1956 67 y.o. PCP: Autumn Presto, Autumn  Date of Admission: 02/17/2023  8:15 AM Date of Discharge: 02/22/23 Attending Physician: Autumn La, Autumn  Discharge Diagnosis: 1. Principal Problem:   Acute hypoxic respiratory failure (HCC) Active Problems:   Depression   Atrial fibrillation with rapid ventricular response (HCC)   COPD (chronic obstructive pulmonary disease) (HCC)   Acute on chronic systolic heart failure Autumn G [place] Autumn [name])   Discharge Medications: Allergies as of 02/22/2023       Reactions   Pamelor [nortriptyline Hcl] Anaphylaxis   Vibra-tab [doxycycline] Other (See Comments)   Unknown reaction        Medication List     TAKE these medications    albuterol 0.63 MG/3ML nebulizer solution Commonly known as: ACCUNEB Take 1 ampule by nebulization every 6 (six) hours as needed for wheezing or shortness of breath.   Albuterol Sulfate (sensor) 108 (90 Base) MCG/ACT Aepb Inhale 2 puffs into the lungs every 6 (six) hours as needed (wheezing, shortness of breath).   ARIPiprazole 5 MG tablet Commonly known as: ABILIFY Take 1 tablet (5 mg total) by mouth daily. Start taking on: February 23, 2023 What changed:  medication strength how much to take   atorvastatin 80 MG tablet Commonly known as: LIPITOR Take 1 tablet (80 mg total) by mouth daily.   Breztri Aerosphere 160-9-4.8 MCG/ACT Aero Generic drug: Budeson-Glycopyrrol-Formoterol Inhale 2 puffs into the lungs 2 (two) times daily.   Buprenorphine HCl-Naloxone HCl 8-2 MG Film Commonly known as: Suboxone Place 1 Film under the tongue 3 (three) times daily for 14 days.   busPIRone 10 MG tablet Commonly known as: BUSPAR Take 1 tablet (10 mg total) by mouth 2 (two) times daily.   carbidopa-levodopa 25-100 MG tablet Commonly known as: SINEMET IR Take 1 tablet by mouth 3 (three) times daily.   dapagliflozin propanediol 10 MG Tabs tablet Commonly known as: FARXIGA Take 1  tablet (10 mg total) by mouth daily. Start taking on: February 23, 2023 What changed: medication strength   diclofenac Sodium 1 % Gel Commonly known as: VOLTAREN Apply 2-4 g topically 4 (four) times daily as needed (pain).   digoxin 0.25 MG tablet Commonly known as: LANOXIN Take 1 tablet (0.25 mg total) by mouth daily. Start taking on: February 23, 2023   DULoxetine 30 MG capsule Commonly known as: CYMBALTA Take 1 capsule (30 mg total) by mouth daily. Start taking on: February 23, 2023 What changed:  medication strength how much to take   fluticasone 50 MCG/ACT nasal spray Commonly known as: FLONASE Place 2 sprays into both nostrils daily as needed for allergies.   furosemide 80 MG tablet Commonly known as: LASIX Take 1 tablet (80 mg total) by mouth daily. What changed:  medication strength how much to take   gabapentin 600 MG tablet Commonly known as: NEURONTIN Take 600 mg by mouth 3 (three) times daily.   guaiFENesin-dextromethorphan 100-10 MG/5ML syrup Commonly known as: ROBITUSSIN DM Take 5 mLs by mouth every 6 (six) hours as needed for cough.   losartan 25 MG tablet Commonly known as: COZAAR Take 1 tablet (25 mg total) by mouth daily.   melatonin 3 MG Tabs tablet Take 1 tablet (3 mg total) by mouth at bedtime as needed. What changed: reasons to take this   metoprolol 200 MG 24 hr tablet Commonly known as: TOPROL-XL Take 1 tablet (200 mg total) by mouth daily. Take with or immediately following a meal. What changed:  medication strength how much to take   multivitamin with minerals Tabs tablet Take 1 tablet by mouth every morning.   naloxone 4 MG/0.1ML Liqd nasal spray kit Commonly known as: NARCAN Place 4 mg into the nose as needed (overdose, as needed).   pantoprazole 40 MG tablet Commonly known as: Protonix Take 1 tablet (40 mg total) by mouth daily.   predniSONE 20 MG tablet Commonly known as: DELTASONE Take 2 tablets (40 mg total) by mouth daily with  breakfast for 1 day. Start taking on: February 23, 2023   rivaroxaban 20 MG Tabs tablet Commonly known as: XARELTO Take 1 tablet (20 mg total) by mouth daily with supper. What changed: when to take this               Durable Medical Equipment  (From admission, onward)           Start     Ordered   02/21/23 1537  For home use only DME 4 wheeled rolling walker with seat  Once       Comments: Bariatric  Question:  Patient needs a walker to treat with the following condition  Answer:  Weakness   02/21/23 1536   02/19/23 1311  For home use only DME Hospital bed  Once       Comments: Therapeutic mattress  Question Answer Comment  Length of Need Lifetime   Patient has (list medical condition): CHF   The above medical condition requires: Patient requires the ability to reposition frequently   Head must be elevated greater than: 30 degrees   Bed type Semi-electric      02/19/23 1312            Disposition and follow-up:   [name] was discharged from [facility] in Stable condition.  At the hospital follow up visit please address:  1.  Follow-Up:  A. Atrial Fibrillation with RVR: Evaluate heart rate and symptoms, ensure medication adherence  B. HFrEF exacerbation: Evaluate volume status, ensure medication adherence  C. COPD exacerbation: Evaluate for signs and symptoms of dyspnea, monitor at home oxygen requirement  D. Chronic Hepatitis C: Continue to follow-up outpatient  2.  Labs / imaging needed at time of follow-up: BMP  3.  Pending labs/ test needing follow-up: None  4. Medication Changes:  - Abilify 5 mg tablet by mouth daily - Cymbalta 30 mg capsule by mouth daily - Lasix 80 mg by mouth daily - Toprol-XL 200 mg tablet by mouth daily - Xarelto 20 mg tablet by mouth daily  Follow-up Appointments:  Follow-up Information     [facility]. Go in 7 day(s).   Specialty: Cardiology Why:  Hospital follow up 03/01/2023 @11  am PLEASE bring a current medication list to appointment FREE valet parking, Entrance C, off [employer] information: [address] [id number] mc [place] [address] [phone]        [facility] Follow up.   Why: hospital bed Contact information: [phone]        [facility] Follow up.   Specialty: Rehabilitation Why: they will call you to set up apt times, if you do not hear from them in 3 business days please call them. Contact information: [address] [id number] mc [address] [place] [address] [phone]        [place], Meredith Mody, Autumn. Call.   Specialty: Family Medicine Contact information: [phone] [address]  [place] [address] [phone]                 Hospital Course by problem list:  #Acute hypoxic respiratory failure #History of COPD, exacerbation #History of chronic systolic heart failure, exacerbation Patient presented to the ED with progressive dyspnea and lower extremity edema.  Her baseline requirement is 2 L, but leading up to admission, it was 4 L. In the ED, she received IV Lasix with concerns for volume overload.  CXR showed vascular congestion with bilateral interstitial opacities consistent with pulmonary edema with an elevated BNP.  Given her respiratory distress, she was started on BiPAP 16/8 with an FiO2 of 40%.  Her ABG demonstrated improved ventilation so she was weaned off of BiPAP and transitioned to 4 L [place]. For her volume overload, she was diuresed with IV Lasix.  Throughout her hospital stay, her net output was roughly -14 L. For her COPD exacerbation, she treated with prednisone and bronchodilators.  Her prednisone treatment was extended to 7 days given slow response.  She was also started on azithromycin to reduce potential pulmonary inflammation, will not continue at discharge.  Repeat chest  x-ray demonstrated resolving congestive heart failure. No leukocytosis.  Albuterol was avoided given her A-fib with RVR.  She was restarted on losartan, Farxiga, and metoprolol. She was discharged on oral furosemide 80 mg. Weight of 96.9 kg. At discharge, she has been able to independently ambulate with improved breathing.  #Atrial fibrillation with RVR Patient presented to the ED with atrial fibrillation with RVR, frequent PVCs, and a prolonged QT.  She has a history of atrial fibrillation that has been difficult to control with unsuccessful attempts at cardioversion and staying in sinus rhythm.  She was not a good candidate for diltiazem as she has reduced EF.  She demonstrated persistent tachycardia that was treated with Lopressor as needed.  Her Toprol-XL was increased to 200 mg. Xarelto was chosen for once daily dosing. Her tachycardia remained in the 110s to 120s so IV digoxin was given which brought her heart rate down to the 90s to 100s.  Patient to continue metoprolol succinate 200 mg once daily and rivaroxaban 20 mg once daily after discharge.  To control her heart rate, loading dose of digoxin was administered followed by a maintenance dose digoxin that she will continue outpatient.  At discharge, her heart rate is 60.  Follow-up outpatient with medication adherence.  #Opioid use disorder Patient has a history of polysubstance use disorder and takes Suboxone 8-2 mg 3 times daily for opioid use disorder at home.  Patient received home Suboxone during hospitalization and will continue discharge.  After discharge, follow-up outpatient.  #Anxiety #MDD #Chronic Pain Syndrome Patient noted history anxiety has been exacerbated by her current hospitalization that was treated with hydroxyzine as needed. Near discharge, psychiatry was also consulted for depression noted by patient. She resumed BuSpar 10 mg twice daily, Abilify 5 mg once daily, and Cymbalta 30 mg once daily.   #T2DM Last A1c was 6.1 in  10/2022.  Glucose was stable throughout hospitalization.  After discharge, follow-up outpatient.  #CAD s/p Stenting Patient denied chest pain and no related changes noted on EKG. Continue statin medication at discharge.  #Parkinson's disease Patient follows with outpatient neurology and symptoms have remained stable.  Home carbidopa-levodopa resumed at discharge.  #Chronic hepatitis C infection Diagnosed but unclear if resolved or not. No clinical signs of cirrhosis or dysfunction.  Follow-up outpatient  Discharge Exam:   BP 109/74 (BP Location:  Right Arm)   Pulse 60   Temp 97.9 F (36.6 C) (Oral)   Resp 10   Ht 5\' 5"  (1.651 m)   Wt 96.9 kg   SpO2 95%   BMI 35.55 kg/m  Discharge exam:   Subjective  Patient was evaluated bedside.  No acute events overnight.  She notes improvement in her breathing and was saturating well on 3L of oxygen.   Physical Exam Constitutional:      Appearance: She is well-developed.  HENT:     Head: Normocephalic and atraumatic.  Cardiovascular:     Rate and Rhythm: Normal rate.  Pulmonary:     Comments: Bilateral wheezing Abdominal:     General: Abdomen is flat.     Palpations: Abdomen is soft.  Musculoskeletal:     Right lower leg: 1+ Pitting Edema present.     Left lower leg: 1+ Pitting Edema present.  Skin:    General: Skin is warm.  Neurological:     General: No focal deficit present.     Mental Status: She is alert.  Psychiatric:        Mood and Affect: Mood and affect normal.    Pertinent Labs, Studies, and Procedures:  CBC    Component Value Date/Time   WBC 10.5 02/22/2023 0100   RBC 4.54 02/22/2023 0100   HGB 11.9 (L) 02/22/2023 0100   HCT 39.4 02/22/2023 0100   PLT 317 02/22/2023 0100   MCV 86.8 02/22/2023 0100   MCH 26.2 02/22/2023 0100   MCHC 30.2 02/22/2023 0100   RDW 15.9 (H) 02/22/2023 0100   LYMPHSABS 1.9 02/17/2023 0845   MONOABS 0.5 02/17/2023 0845   EOSABS 0.1 02/17/2023 0845   BASOSABS 0.0 02/17/2023 0845    CMP     Component Value Date/Time   NA 139 02/22/2023 0100   K 4.5 02/22/2023 0100   CL 88 (L) 02/22/2023 0100   CO2 35 (H) 02/22/2023 0100   GLUCOSE 94 02/22/2023 0100   BUN 25 (H) 02/22/2023 0100   CREATININE 1.03 (H) 02/22/2023 0100   CALCIUM 9.3 02/22/2023 0100   PROT 6.2 (L) 01/18/2023 0935   ALBUMIN 2.7 (L) 01/18/2023 0935   AST 39 01/18/2023 0935   ALT 26 01/18/2023 0935   ALKPHOS 73 01/18/2023 0935   BILITOT 0.5 01/18/2023 0935   GFRNONAA 60 (L) 02/22/2023 0100    Discharge Instructions: Discharge Instructions     (HEART FAILURE PATIENTS) Call Autumn:  Anytime you have any of the following symptoms: 1) 3 pound weight gain in 24 hours or 5 pounds in 1 week 2) shortness of breath, with or without a dry hacking cough 3) swelling in the hands, feet or stomach 4) if you have to sleep on extra pillows at night in order to breathe.   Complete by: As directed    Call Autumn for:  difficulty breathing, headache or visual disturbances   Complete by: As directed    Call Autumn for:  persistant dizziness or light-headedness   Complete by: As directed    Diet - low sodium heart healthy   Complete by: As directed    Discharge instructions   Complete by: As directed    Ms. Autumn [name],  You were hospitalized for shortness of breath.  This was likely caused by your heart failure and COPD.  For the heart failure, you were given Lasix to help remove extra fluid from your body.  We have also changed your home medication Lasix so you only have to  take 1 pill/day.  You will also take your losartan once per day.  You will also continue to work with physical therapy outpatient as you were before.  For your shortness of breath, you were given breathing treatments.  We believe there might have been some inflammation as well, so you were given medication to reduce this.  You will take prednisone and azithromycin for 1 more day.  You also experienced an increased heart rate likely caused by her atrial  fibrillation.  Your Toprol-XL was increased so you will need to take it once per day.  You were also started on digoxin to help decrease your heart rate.  You will also continue taking Xarelto, and this was adjusted so you only have to take it once per day.  During the stay, your decreased mood was treated by resuming Abilify, Cymbalta, and BuSpar.  You will continue taking these at home.  For your opioid use disorder, we resumed your Suboxone.  Please follow-up with this with your outpatient provider.  You will continue taking the following medications:  -Azithromycin 500 mg (last dose will be on 7/16) -Prednisone 2 tablets for 40 mg total with breakfast (last dose will be on 7/16) -Digoxin 0.25 mg tablet by mouth daily -Losartan 25 mg tablet by mouth daily -Atorvastatin 80 mg by mouth daily -Breztri Aerosphere 2 puffs into the lungs 2 times daily -Suboxone 1 film under the tongue 3 times daily -BuSpar 10 mg tablet by mouth 2 times daily -Carbidopa-levodopa 25-100 mg 1 tablet by mouth 3 times daily -Farxiga 5 mg 2 tablets (10 mg total) by mouth daily  -Voltaren 1% gel 2 to 4 g topically 4 times daily as needed for pain -Flonase 2 sprays into both nostrils as needed for allergies -Gabapentin 600 mg by mouth 3 times daily -Robitussin DM 5 mL by mouth every 6 hours as needed for cough -Melatonin 3 mg 1 tablet by mouth at bedtime as needed -Protonix 40 mg tablet by mouth daily -ACCUNEB 1 ampule every 6 hours as needed for wheezing or shortness of breath -Albuterol sulfate 2 puffs into the lungs every 6 hours as needed for wheezing and shortness of breath  You will **change** how you take the following medications as follows:  -Abilify 5 mg tablet by mouth daily -Cymbalta 30 mg capsule by mouth daily -Lasix 80 mg (2 tablets) by mouth daily -Toprol-XL 200 mg tablet by mouth daily -Xarelto 20 mg tablet by mouth daily  If you have any questions or concerns, please call us at [phone].  Please make an appointment with your primary care provider. You also have an appointment with cardiology scheduled for 7/22 at 11 AM scheduled. We're glad you're feeling better!   Increase activity slowly   Complete by: As directed       Signed: Morrie Sheldon, Autumn 02/22/2023, 1:39 PM   Pager: [phone]

## 2023-02-22 NOTE — TOC Transition Note (Signed)
Transition of Care [facility]) - CM/SW Discharge Note   Patient Details  Name: Autumn [name] MRN: [id number] Date of Birth: 1955/11/18  Transition of Care [facility]) CM/SW Contact:  Leone Haven, RN Phone Number: 02/22/2023, 3:44 PM   Clinical Narrative:    Patient dc to home, she has transport.      Barriers to Discharge: Continued Medical Work up   Patient Goals and CMS Choice   Choice offered to / list presented to : NA  Discharge Placement                         Discharge Plan and Services Additional resources added to the After Visit Summary for   In-house Referral: NA Discharge Planning Services: CM Consult Post Acute Care Choice: NA          DME Arranged: N/A DME Agency: NA       HH Arranged: NA          Social Determinants of Health (SDOH) Interventions SDOH Screenings   Food Insecurity: Patient Declined (02/18/2023)  Housing: Patient Declined (02/18/2023)  Transportation Needs: Patient Declined (02/18/2023)  Utilities: At Risk (10/13/2022)   Received from [facility], [facility]  Alcohol Screen: Low Risk  (01/21/2023)  Financial Resource Strain: Low Risk  (01/21/2023)  Physical Activity: Unknown (01/30/2022)   Received from [facility], [facility]  Social Connections: Unknown (02/05/2023)   Received from [facility], [facility]  Stress: Stress Concern Present (01/30/2022)   Received from [facility], [facility]  Tobacco Use: High Risk (02/17/2023)     Readmission Risk Interventions    01/21/2023    1:29 PM 12/07/2022    1:27 PM  Readmission Risk Prevention Plan  Transportation Screening Complete Complete  PCP or Specialist Appt within 3-5 Days  Complete  HRI or Home Care Consult  Complete  Social Work Consult for Recovery Care Planning/Counseling  Complete  Palliative Care Screening  Not Applicable  Medication Review [employer]) Complete Complete  PCP or Specialist appointment within 3-5 days of discharge  Complete   HRI or Home Care Consult Complete   SW Recovery Care/Counseling Consult Complete   Palliative Care Screening Not Applicable   Skilled Nursing Facility Not Applicable

## 2023-02-22 NOTE — Progress Notes (Addendum)
   Patient Name: Autumn [name] Date of Encounter: 02/22/2023 [facility] Cardiologist: Thomasene Ripple, DO   Interval Summary  .    Patient continues to feel improved from day to day. Has not ambulated today but did yesterday and says this went well. Breathing is improving and patient feels better with reduced HR. Again discussed with her the importance of medication compliance.   Vital Signs .    Vitals:   02/22/23 0055 02/22/23 0405 02/22/23 0735 02/22/23 0804  BP: 126/78 (!) 121/97 116/86   Pulse: 82 78 78   Resp: (!) 21 16 12    Temp: 98.7 F (37.1 C) 98.5 F (36.9 C) 99.2 F (37.3 C)   TempSrc: Oral Oral Oral   SpO2: 95% 97% 95% 97%  Weight:  96.9 kg    Height:        Intake/Output Summary (Last 24 hours) at 02/22/2023 1105 Last data filed at 02/22/2023 0800 Gross per 24 hour  Intake 360 ml  Output 4450 ml  Net -4090 ml      02/22/2023    4:05 AM 02/20/2023    5:00 AM 02/19/2023    5:00 AM  Last 3 Weights  Weight (lbs) 213 lb 10 oz 222 lb 10.6 oz 227 lb 15.3 oz  Weight (kg) 96.9 kg 101 kg 103.4 kg      Telemetry/ECG    Persistent atrial fibrillation, ventricular rates 80s-90s overnight - Personally Reviewed  Physical Exam .   GEN: No acute distress.   Neck: No JVD Cardiac: Irregularly irregular, no rubs, or gallops. Systolic murmur at apex. Respiratory: Clear to auscultation bilaterally. GI: Soft, nontender, non-distended  MS: No edema  Assessment & Plan .     Persistent atrial fibrillation Secondary hypercoagulable state  Patient with much improved rates over the last ~48 hours after receiving digoxin. Continue Xarelto 20mg , Metoprolol Succinate 200mg  every day, Digoxin 0.25mg  every day. Remains a complicated situation with recent medication non-compliance. I do not favor longterm digoxin use for this patient. Will plan for close outpatient follow up to consider of DCCV after 3 weeks uninterrupted OAC. Patient reported use of methamphetamines prior  to this admission and should have UDS prior to a DCCV.   Acute on chronic HFrEF Acute on chronic hypoxic respiratory failure w/ COPD   Patient presented with symptoms of acute CHF, volume overload in setting of medication non-compliance. LVEF of 35-40%, moderated reduced RV, mildly elevated pulmonary artery pressure on last TTE. BNP this admission 1241 with CXR showing small bilateral pleural effusions.   Patient now net negative 13.554L this admission. Appears euvolemic on physical exam. Agree with transition to oral lasix 80mg  today.  Continue Losartan 25mg . Could consider transition to [facility] though patient with poor compliance hx and says that she would struggle to take any medication that's more frequent than once daily.  Resume Farxiga 10mg  Consider Spironolactone initiation at discharge  CAD s/p prior DES to LAD   Patient without chest pain or evidence of ACS. No ASA with DOAC. Continue metoprolol and statin therapy.   Mitral regurgitation (severe)   As seen on 01/19/23 limited TTE, moderate to severe MV regurgitation without stenosis. Continue to monitor.    For questions or updates, please contact [facility] Please consult [email or web address] for contact info under        Signed, Perlie Gold, PA-C

## 2023-02-22 NOTE — Progress Notes (Signed)
   Heart Failure Stewardship Pharmacist Progress Note   PCP: Corrington, Kip A, MD PCP-Cardiologist: Thomasene Ripple, DO    HPI:  67 yo F with PMH of CHF, CAD, HTN, afib, Parkinson's, and polysubstance use.    Admitted in 08/2022 with shortness of breath, found to be in afib RVR. LVEF 40-45%. At follow up, she remained in afib and underwent cardioversion. Ongoing drug use suspected to have contributed to afib RVR.    Admitted 11/2022 with shortness of breath and palpitations, back in afib RVR. Noncompliance with medications and continued substance use. LVEF 40-45%.   Admitted 01/2023 with shortness of breath and LE edema. In afib RVR again. CXR with mild CHF. ECHO 6/11 showed LVEF 35-40%, global hypokinesis, RV moderately reduced, moderate to severe MR. Discharged and was a no show for follow up.  Readmitted 7/10 with shortness of breath and LE edema. Reports she has not taken her medications since hospital discharge. She states they were difficult for her to understand instructions. BNP 1231. CXR with pulmonary edema and bilateral pleural effusions. EKG with afib RVR. Found to be hypoxic and required BiPAP.   Discharge HF Medications: Beta Blocker: metoprolol XL 200 mg daily ACE/ARB/ARNI: losartan 25 mg daily SGLT2i: Farxiga 10 mg daily Other: digoxin 0.25 mg daily  Prior to admission HF Medications: None - not taking prescribed lasix 40 mg daily, Farxiga 10 mg daily, or metoprolol XL 150 mg daily  Pertinent Lab Values: Serum creatinine 1.03, BUN 25, Potassium 4.5, Sodium 139, BNP 1231.2, Magnesium 1.8  Vital Signs: Weight: 213 lbs (admission weight: 231 lbs) Blood pressure: 110/80s  Heart rate: 80s  I/O: net -3.7L yesterday; net -13.5L since admission  Medication Assistance / Insurance Benefits Check: Does the patient have prescription insurance?  Yes Type of insurance plan: [place] Medicaid  Outpatient Pharmacy:  Prior to admission outpatient pharmacy: [facility] Is the patient willing  to use [facility] TOC pharmacy at discharge? Yes    Assessment: 1. Acute on chronic systolic CHF (LVEF 35-40%) with medication noncompliance. NYHA class III symptoms. - Furosemide written as 40 mg - take 2 tablet by mouth daily for discharge. Consider changing to 80 mg strength for compliance concern. Strict I/Os and daily weights. Keep K>4 and Mg>2. - Continue metoprolol XL 200 mg daily   - Continue losartan 25 mg daily - has issues with noncompliance in the past. Will need a simplistic regimen and BID dosing with Sherryll Burger is too complex for her. Patient prefers once per day medications.  Marcelline Deist written as 5 mg - take 2 tablets by mouth daily for discharge. Consider changing to 10 mg strength for compliance concern.  - Continue digoxin 0.25 mg daily - digoxin level due at follow up   Plan: 1) Medication changes recommended at this time: Change furosemide to 80 mg 1 tablet daily on discharge - Change Farxiga to 10 mg 1 tablet daily on discharge - Check digoxin level at follow up  2) Patient assistance: - Farxiga copay $0  3)  Education  - Patient has been educated on current HF medications and potential additions to HF medication regimen - Patient verbalizes understanding that over the next few months, these medication doses may change and more medications may be added to optimize HF regimen - Patient has been educated on basic disease state pathophysiology and goals of therapy   Sharen Hones, PharmD, BCPS Heart Failure Stewardship Pharmacist Phone [phone]

## 2023-02-26 ENCOUNTER — Telehealth (HOSPITAL_COMMUNITY): Payer: Self-pay | Admitting: *Deleted

## 2023-02-26 NOTE — Telephone Encounter (Signed)
Attempted to call patient and remind of upcoming appointment in Heart Failure [facility] 03/01/23. No answer and unable to leave message (no VM set up).

## 2023-03-01 ENCOUNTER — Encounter (HOSPITAL_COMMUNITY): Payer: 59

## 2023-03-01 ENCOUNTER — Telehealth (HOSPITAL_COMMUNITY): Payer: Self-pay | Admitting: Licensed Clinical Social Worker

## 2023-03-01 NOTE — Telephone Encounter (Signed)
CSW consulted to reach out to patient to inquire about todays no show and assist in making sure she can make it to future appt.  CSW attempted to call- number in chart not working unable to leave VM.  CSW called both pt contacts and left VM requesting return call to see if we need updated contact information.  Will continue to attempt contact.  Burna Sis, LCSW Clinical Social Worker [facility] Desk#: [phone] Cell#: [phone]

## 2023-03-03 ENCOUNTER — Telehealth (HOSPITAL_COMMUNITY): Payer: Self-pay | Admitting: Licensed Clinical Social Worker

## 2023-03-03 NOTE — Telephone Encounter (Signed)
CSW called pt to inquire about recent no show and help address barriers to attending future appts.  Unable to reach or leave VM  Burna Sis, LCSW Clinical Social Worker [facility] Desk#: [phone] Cell#: [phone]

## 2023-03-05 ENCOUNTER — Telehealth (HOSPITAL_COMMUNITY): Payer: Self-pay | Admitting: Licensed Clinical Social Worker

## 2023-03-05 NOTE — Telephone Encounter (Signed)
CSW attempted to reach pt to discuss barriers to attending appt- unable to reach- unable to leave VM  Burna Sis, LCSW Clinical Social Worker [facility] Desk#: [phone] Cell#: [phone]

## 2023-03-10 ENCOUNTER — Ambulatory Visit: Payer: 59

## 2023-03-11 ENCOUNTER — Other Ambulatory Visit (HOSPITAL_BASED_OUTPATIENT_CLINIC_OR_DEPARTMENT_OTHER): Payer: Self-pay

## 2023-04-10 ENCOUNTER — Other Ambulatory Visit (HOSPITAL_COMMUNITY): Payer: Self-pay

## 2023-05-26 ENCOUNTER — Other Ambulatory Visit (HOSPITAL_COMMUNITY): Payer: Self-pay

## 2023-05-31 ENCOUNTER — Inpatient Hospital Stay (HOSPITAL_COMMUNITY)
Admission: EM | Admit: 2023-05-31 | Discharge: 2023-06-09 | DRG: 291 | Disposition: A | Discharge status: Home Health | Payer: 59 | Attending: Internal Medicine | Admitting: Internal Medicine

## 2023-05-31 ENCOUNTER — Encounter (HOSPITAL_COMMUNITY): Payer: Self-pay | Admitting: Internal Medicine

## 2023-05-31 ENCOUNTER — Inpatient Hospital Stay (HOSPITAL_COMMUNITY): Payer: 59

## 2023-05-31 ENCOUNTER — Other Ambulatory Visit: Payer: Self-pay

## 2023-05-31 ENCOUNTER — Emergency Department (HOSPITAL_COMMUNITY): Payer: 59

## 2023-05-31 DIAGNOSIS — Z87898 Personal history of other specified conditions: Secondary | ICD-10-CM | POA: Diagnosis present

## 2023-05-31 DIAGNOSIS — F419 Anxiety disorder, unspecified: Secondary | ICD-10-CM | POA: Diagnosis present

## 2023-05-31 DIAGNOSIS — J9621 Acute and chronic respiratory failure with hypoxia: Secondary | ICD-10-CM | POA: Diagnosis present

## 2023-05-31 DIAGNOSIS — I480 Paroxysmal atrial fibrillation: Secondary | ICD-10-CM | POA: Diagnosis present

## 2023-05-31 DIAGNOSIS — E785 Hyperlipidemia, unspecified: Secondary | ICD-10-CM | POA: Diagnosis present

## 2023-05-31 DIAGNOSIS — F1910 Other psychoactive substance abuse, uncomplicated: Secondary | ICD-10-CM | POA: Diagnosis present

## 2023-05-31 DIAGNOSIS — J9601 Acute respiratory failure with hypoxia: Secondary | ICD-10-CM | POA: Diagnosis present

## 2023-05-31 DIAGNOSIS — I2510 Atherosclerotic heart disease of native coronary artery without angina pectoris: Secondary | ICD-10-CM | POA: Diagnosis present

## 2023-05-31 DIAGNOSIS — J449 Chronic obstructive pulmonary disease, unspecified: Secondary | ICD-10-CM | POA: Diagnosis present

## 2023-05-31 DIAGNOSIS — G20A1 Parkinson's disease without dyskinesia, without mention of fluctuations: Secondary | ICD-10-CM | POA: Diagnosis present

## 2023-05-31 DIAGNOSIS — Z1152 Encounter for screening for COVID-19: Secondary | ICD-10-CM

## 2023-05-31 DIAGNOSIS — Z7901 Long term (current) use of anticoagulants: Secondary | ICD-10-CM

## 2023-05-31 DIAGNOSIS — E1151 Type 2 diabetes mellitus with diabetic peripheral angiopathy without gangrene: Secondary | ICD-10-CM | POA: Diagnosis present

## 2023-05-31 DIAGNOSIS — I5023 Acute on chronic systolic (congestive) heart failure: Secondary | ICD-10-CM | POA: Diagnosis present

## 2023-05-31 DIAGNOSIS — I420 Dilated cardiomyopathy: Secondary | ICD-10-CM | POA: Diagnosis present

## 2023-05-31 DIAGNOSIS — Z7951 Long term (current) use of inhaled steroids: Secondary | ICD-10-CM

## 2023-05-31 DIAGNOSIS — F909 Attention-deficit hyperactivity disorder, unspecified type: Secondary | ICD-10-CM | POA: Diagnosis present

## 2023-05-31 DIAGNOSIS — Z79899 Other long term (current) drug therapy: Secondary | ICD-10-CM | POA: Diagnosis not present

## 2023-05-31 DIAGNOSIS — E66813 Obesity, class 3: Secondary | ICD-10-CM | POA: Diagnosis present

## 2023-05-31 DIAGNOSIS — Z7984 Long term (current) use of oral hypoglycemic drugs: Secondary | ICD-10-CM

## 2023-05-31 DIAGNOSIS — I959 Hypotension, unspecified: Secondary | ICD-10-CM | POA: Diagnosis not present

## 2023-05-31 DIAGNOSIS — I110 Hypertensive heart disease with heart failure: Principal | ICD-10-CM | POA: Diagnosis present

## 2023-05-31 DIAGNOSIS — G8929 Other chronic pain: Secondary | ICD-10-CM | POA: Diagnosis present

## 2023-05-31 DIAGNOSIS — I4891 Unspecified atrial fibrillation: Secondary | ICD-10-CM | POA: Diagnosis present

## 2023-05-31 DIAGNOSIS — Z881 Allergy status to other antibiotic agents status: Secondary | ICD-10-CM

## 2023-05-31 DIAGNOSIS — Z6841 Body Mass Index (BMI) 40.0 and over, adult: Secondary | ICD-10-CM

## 2023-05-31 DIAGNOSIS — F17210 Nicotine dependence, cigarettes, uncomplicated: Secondary | ICD-10-CM | POA: Diagnosis present

## 2023-05-31 DIAGNOSIS — I509 Heart failure, unspecified: Principal | ICD-10-CM

## 2023-05-31 DIAGNOSIS — Z888 Allergy status to other drugs, medicaments and biological substances status: Secondary | ICD-10-CM

## 2023-05-31 DIAGNOSIS — I4811 Longstanding persistent atrial fibrillation: Secondary | ICD-10-CM | POA: Diagnosis present

## 2023-05-31 DIAGNOSIS — M1990 Unspecified osteoarthritis, unspecified site: Secondary | ICD-10-CM | POA: Diagnosis present

## 2023-05-31 DIAGNOSIS — I739 Peripheral vascular disease, unspecified: Secondary | ICD-10-CM

## 2023-05-31 DIAGNOSIS — F1510 Other stimulant abuse, uncomplicated: Secondary | ICD-10-CM | POA: Diagnosis present

## 2023-05-31 DIAGNOSIS — F1110 Opioid abuse, uncomplicated: Secondary | ICD-10-CM | POA: Diagnosis present

## 2023-05-31 DIAGNOSIS — Z955 Presence of coronary angioplasty implant and graft: Secondary | ICD-10-CM | POA: Diagnosis present

## 2023-05-31 DIAGNOSIS — M797 Fibromyalgia: Secondary | ICD-10-CM | POA: Diagnosis present

## 2023-05-31 DIAGNOSIS — Z91148 Patient's other noncompliance with medication regimen for other reason: Secondary | ICD-10-CM

## 2023-05-31 DIAGNOSIS — F329 Major depressive disorder, single episode, unspecified: Secondary | ICD-10-CM | POA: Diagnosis present

## 2023-05-31 DIAGNOSIS — F32A Depression, unspecified: Secondary | ICD-10-CM | POA: Diagnosis present

## 2023-05-31 LAB — I-STAT VENOUS BLOOD GAS, ED
Acid-Base Excess: 10 mmol/L — ABNORMAL HIGH (ref 0.0–2.0)
Acid-base deficit: 1 mmol/L (ref 0.0–2.0)
Acid-base deficit: 1 mmol/L (ref 0.0–2.0)
Acid-base deficit: 1 mmol/L (ref 0.0–2.0)
Bicarbonate: 28.1 mmol/L — ABNORMAL HIGH (ref 20.0–28.0)
Bicarbonate: 28.1 mmol/L — ABNORMAL HIGH (ref 20.0–28.0)
Bicarbonate: 28.1 mmol/L — ABNORMAL HIGH (ref 20.0–28.0)
Bicarbonate: 33.8 mmol/L — ABNORMAL HIGH (ref 20.0–28.0)
Calcium, Ion: 1.14 mmol/L — ABNORMAL LOW (ref 1.15–1.40)
Calcium, Ion: 1.14 mmol/L — ABNORMAL LOW (ref 1.15–1.40)
Calcium, Ion: 1.14 mmol/L — ABNORMAL LOW (ref 1.15–1.40)
Calcium, Ion: 1 mmol/L — ABNORMAL LOW (ref 1.15–1.40)
HCT: 35 % — ABNORMAL LOW (ref 36.0–46.0)
HCT: 35 % — ABNORMAL LOW (ref 36.0–46.0)
HCT: 35 % — ABNORMAL LOW (ref 36.0–46.0)
HCT: 34 % — ABNORMAL LOW (ref 36.0–46.0)
Hemoglobin: 11.9 g/dL — ABNORMAL LOW (ref 12.0–15.0)
Hemoglobin: 11.9 g/dL — ABNORMAL LOW (ref 12.0–15.0)
Hemoglobin: 11.9 g/dL — ABNORMAL LOW (ref 12.0–15.0)
Hemoglobin: 11.6 g/dL — ABNORMAL LOW (ref 12.0–15.0)
O2 Saturation: 99 %
O2 Saturation: 99 %
O2 Saturation: 99 %
O2 Saturation: 98 %
Potassium: 4.4 mmol/L (ref 3.5–5.1)
Potassium: 4.4 mmol/L (ref 3.5–5.1)
Potassium: 4.4 mmol/L (ref 3.5–5.1)
Potassium: 4.7 mmol/L (ref 3.5–5.1)
Sodium: 139 mmol/L (ref 135–145)
Sodium: 139 mmol/L (ref 135–145)
Sodium: 139 mmol/L (ref 135–145)
Sodium: 137 mmol/L (ref 135–145)
TCO2: 30 mmol/L (ref 22–32)
TCO2: 30 mmol/L (ref 22–32)
TCO2: 30 mmol/L (ref 22–32)
TCO2: 35 mmol/L — ABNORMAL HIGH (ref 22–32)
pCO2, Ven: 71 mm[Hg] (ref 44–60)
pCO2, Ven: 71 mm[Hg] (ref 44–60)
pCO2, Ven: 71 mm[Hg] (ref 44–60)
pCO2, Ven: 41.1 mm[Hg] — ABNORMAL LOW (ref 44–60)
pH, Ven: 7.205 — ABNORMAL LOW (ref 7.25–7.43)
pH, Ven: 7.205 — ABNORMAL LOW (ref 7.25–7.43)
pH, Ven: 7.205 — ABNORMAL LOW (ref 7.25–7.43)
pH, Ven: 7.523 — ABNORMAL HIGH (ref 7.25–7.43)
pO2, Ven: 195 mm[Hg] — ABNORMAL HIGH (ref 32–45)
pO2, Ven: 195 mm[Hg] — ABNORMAL HIGH (ref 32–45)
pO2, Ven: 195 mm[Hg] — ABNORMAL HIGH (ref 32–45)
pO2, Ven: 102 mm[Hg] — ABNORMAL HIGH (ref 32–45)

## 2023-05-31 LAB — COMPREHENSIVE METABOLIC PANEL
ALT: 21 U/L (ref 0–44)
AST: 24 U/L (ref 15–41)
Albumin: 2.9 g/dL — ABNORMAL LOW (ref 3.5–5.0)
Alkaline Phosphatase: 90 U/L (ref 38–126)
Anion gap: 12 (ref 5–15)
BUN: 7 mg/dL — ABNORMAL LOW (ref 8–23)
CO2: 24 mmol/L (ref 22–32)
Calcium: 8.9 mg/dL (ref 8.9–10.3)
Chloride: 103 mmol/L (ref 98–111)
Creatinine, Ser: 0.75 mg/dL (ref 0.44–1.00)
GFR, Estimated: >60 mL/min (ref >60)
Glucose, Bld: 177 mg/dL — ABNORMAL HIGH (ref 70–99)
Potassium: 4.3 mmol/L (ref 3.5–5.1)
Sodium: 139 mmol/L (ref 135–145)
Total Bilirubin: 0.8 mg/dL (ref 0.3–1.2)
Total Protein: 6.9 g/dL (ref 6.5–8.1)

## 2023-05-31 LAB — I-STAT CHEM 8, ED
BUN: 8 mg/dL (ref 8–23)
BUN: 8 mg/dL (ref 8–23)
BUN: 8 mg/dL (ref 8–23)
Calcium, Ion: 1.15 mmol/L (ref 1.15–1.40)
Calcium, Ion: 1.15 mmol/L (ref 1.15–1.40)
Calcium, Ion: 1.15 mmol/L (ref 1.15–1.40)
Chloride: 103 mmol/L (ref 98–111)
Chloride: 103 mmol/L (ref 98–111)
Chloride: 103 mmol/L (ref 98–111)
Creatinine, Ser: 0.7 mg/dL (ref 0.44–1.00)
Creatinine, Ser: 0.7 mg/dL (ref 0.44–1.00)
Creatinine, Ser: 0.7 mg/dL (ref 0.44–1.00)
Glucose, Bld: 175 mg/dL — ABNORMAL HIGH (ref 70–99)
Glucose, Bld: 175 mg/dL — ABNORMAL HIGH (ref 70–99)
Glucose, Bld: 175 mg/dL — ABNORMAL HIGH (ref 70–99)
HCT: 36 % (ref 36.0–46.0)
HCT: 36 % (ref 36.0–46.0)
HCT: 36 % (ref 36.0–46.0)
Hemoglobin: 12.2 g/dL (ref 12.0–15.0)
Hemoglobin: 12.2 g/dL (ref 12.0–15.0)
Hemoglobin: 12.2 g/dL (ref 12.0–15.0)
Potassium: 4.3 mmol/L (ref 3.5–5.1)
Potassium: 4.3 mmol/L (ref 3.5–5.1)
Potassium: 4.3 mmol/L (ref 3.5–5.1)
Sodium: 140 mmol/L (ref 135–145)
Sodium: 140 mmol/L (ref 135–145)
Sodium: 140 mmol/L (ref 135–145)
TCO2: 28 mmol/L (ref 22–32)
TCO2: 28 mmol/L (ref 22–32)
TCO2: 28 mmol/L (ref 22–32)

## 2023-05-31 LAB — I-STAT ARTERIAL BLOOD GAS, ED
Acid-base deficit: 1 mmol/L (ref 0.0–2.0)
Bicarbonate: 27.2 mmol/L (ref 20.0–28.0)
Calcium, Ion: 1.19 mmol/L (ref 1.15–1.40)
HCT: 34 % — ABNORMAL LOW (ref 36.0–46.0)
Hemoglobin: 11.6 g/dL — ABNORMAL LOW (ref 12.0–15.0)
O2 Saturation: 100 %
Patient temperature: 37
Potassium: 4.3 mmol/L (ref 3.5–5.1)
Sodium: 138 mmol/L (ref 135–145)
TCO2: 29 mmol/L (ref 22–32)
pCO2 arterial: 61.3 mm[Hg] — ABNORMAL HIGH (ref 32–48)
pH, Arterial: 7.255 — ABNORMAL LOW (ref 7.35–7.45)
pO2, Arterial: 377 mm[Hg] — ABNORMAL HIGH (ref 83–108)

## 2023-05-31 LAB — APTT: aPTT: 32 s (ref 24–36)

## 2023-05-31 LAB — CBC WITH DIFFERENTIAL/PLATELET
Abs Immature Granulocytes: 0.04 10*3/uL (ref 0.00–0.07)
Basophils Absolute: 0 10*3/uL (ref 0.0–0.1)
Basophils Relative: 0 %
Eosinophils Absolute: 0.2 10*3/uL (ref 0.0–0.5)
Eosinophils Relative: 2 %
HCT: 36.1 % (ref 36.0–46.0)
Hemoglobin: 10.6 g/dL — ABNORMAL LOW (ref 12.0–15.0)
Immature Granulocytes: 0 %
Lymphocytes Relative: 13 %
Lymphs Abs: 1.4 10*3/uL (ref 0.7–4.0)
MCH: 27 pg (ref 26.0–34.0)
MCHC: 29.4 g/dL — ABNORMAL LOW (ref 30.0–36.0)
MCV: 92.1 fL (ref 80.0–100.0)
Monocytes Absolute: 0.7 10*3/uL (ref 0.1–1.0)
Monocytes Relative: 7 %
Neutro Abs: 8.1 10*3/uL — ABNORMAL HIGH (ref 1.7–7.7)
Neutrophils Relative %: 78 %
Platelets: 297 10*3/uL (ref 150–400)
RBC: 3.92 MIL/uL (ref 3.87–5.11)
RDW: 17 % — ABNORMAL HIGH (ref 11.5–15.5)
WBC: 10.4 10*3/uL (ref 4.0–10.5)
nRBC: 0 % (ref 0.0–0.2)

## 2023-05-31 LAB — LACTIC ACID, PLASMA
Lactic Acid, Venous: 1.4 mmol/L (ref 0.5–1.9)
Lactic Acid, Venous: 2.9 mmol/L (ref 0.5–1.9)

## 2023-05-31 LAB — BLOOD GAS, VENOUS
Acid-Base Excess: 11.1 mmol/L — ABNORMAL HIGH (ref 0.0–2.0)
Bicarbonate: 37.4 mmol/L — ABNORMAL HIGH (ref 20.0–28.0)
Drawn by: 1447
O2 Saturation: 77 %
Patient temperature: 36.5
pCO2, Ven: 54 mm[Hg] (ref 44–60)
pH, Ven: 7.45 — ABNORMAL HIGH (ref 7.25–7.43)
pO2, Ven: 42 mm[Hg] (ref 32–45)

## 2023-05-31 LAB — I-STAT CG4 LACTIC ACID, ED
Lactic Acid, Venous: 4.6 mmol/L (ref 0.5–1.9)
Lactic Acid, Venous: 4.6 mmol/L (ref 0.5–1.9)
Lactic Acid, Venous: 4.6 mmol/L (ref 0.5–1.9)
Lactic Acid, Venous: 2.7 mmol/L (ref 0.5–1.9)

## 2023-05-31 LAB — RESP PANEL BY RT-PCR (RSV, FLU A&B, COVID)  RVPGX2
Influenza A by PCR: NEGATIVE
Influenza B by PCR: NEGATIVE
Resp Syncytial Virus by PCR: NEGATIVE
SARS Coronavirus 2 by RT PCR: NEGATIVE

## 2023-05-31 LAB — MAGNESIUM: Magnesium: 2 mg/dL (ref 1.7–2.4)

## 2023-05-31 LAB — TSH: TSH: 2.703 u[IU]/mL (ref 0.350–4.500)

## 2023-05-31 LAB — BRAIN NATRIURETIC PEPTIDE: B Natriuretic Peptide: 606.9 pg/mL — ABNORMAL HIGH (ref 0.0–100.0)

## 2023-05-31 LAB — HEPARIN LEVEL (UNFRACTIONATED): Heparin Unfractionated: 0.16 [IU]/mL — ABNORMAL LOW (ref 0.30–0.70)

## 2023-05-31 LAB — D-DIMER, QUANTITATIVE: D-Dimer, Quant: 3.61 ug{FEU}/mL — ABNORMAL HIGH (ref 0.00–0.50)

## 2023-05-31 LAB — DIGOXIN LEVEL: Digoxin Level: <0.2 ng/mL — ABNORMAL LOW (ref 0.8–2.0)

## 2023-05-31 LAB — TROPONIN I (HIGH SENSITIVITY)
Troponin I (High Sensitivity): 26 ng/L — ABNORMAL HIGH (ref <18)
Troponin I (High Sensitivity): 30 ng/L — ABNORMAL HIGH (ref <18)

## 2023-05-31 MED ORDER — ACETAMINOPHEN 325 MG PO TABS
650.0000 mg | ORAL_TABLET | Freq: Four times a day (QID) | ORAL | Status: DC | PRN
  Administered 2023-06-01: 650 mg via ORAL
  Filled 2023-05-31: qty 2

## 2023-05-31 MED ORDER — MELATONIN 3 MG PO TABS
3.0000 mg | ORAL_TABLET | Freq: Every evening | ORAL | Status: DC | PRN
  Administered 2023-06-07 – 2023-06-08 (×2): 3 mg via ORAL
  Filled 2023-05-31 (×2): qty 1

## 2023-05-31 MED ORDER — DAPAGLIFLOZIN PROPANEDIOL 10 MG PO TABS
10.0000 mg | ORAL_TABLET | Freq: Every day | ORAL | Status: DC
  Administered 2023-06-01 – 2023-06-09 (×9): 10 mg via ORAL
  Filled 2023-05-31 (×10): qty 1

## 2023-05-31 MED ORDER — METOPROLOL SUCCINATE ER 100 MG PO TB24
100.0000 mg | ORAL_TABLET | Freq: Every day | ORAL | Status: DC
  Administered 2023-05-31 – 2023-06-01 (×2): 100 mg via ORAL
  Filled 2023-05-31: qty 4
  Filled 2023-05-31: qty 1

## 2023-05-31 MED ORDER — FUROSEMIDE 10 MG/ML IJ SOLN
40.0000 mg | Freq: Two times a day (BID) | INTRAMUSCULAR | Status: AC
  Administered 2023-05-31 – 2023-06-01 (×3): 40 mg via INTRAVENOUS
  Filled 2023-05-31 (×3): qty 4

## 2023-05-31 MED ORDER — DICLOFENAC SODIUM 1 % EX GEL
2.0000 g | Freq: Four times a day (QID) | CUTANEOUS | Status: DC | PRN
  Administered 2023-06-09: 2 g via TOPICAL
  Filled 2023-05-31: qty 100

## 2023-05-31 MED ORDER — BUPRENORPHINE HCL-NALOXONE HCL 8-2 MG SL SUBL
1.0000 | SUBLINGUAL_TABLET | Freq: Three times a day (TID) | SUBLINGUAL | Status: DC
  Administered 2023-05-31 – 2023-06-09 (×18): 1 via SUBLINGUAL
  Filled 2023-05-31 (×24): qty 1

## 2023-05-31 MED ORDER — ATORVASTATIN CALCIUM 80 MG PO TABS
80.0000 mg | ORAL_TABLET | Freq: Every day | ORAL | Status: DC
  Administered 2023-05-31 – 2023-06-09 (×10): 80 mg via ORAL
  Filled 2023-05-31 (×8): qty 1
  Filled 2023-05-31: qty 2
  Filled 2023-05-31: qty 1

## 2023-05-31 MED ORDER — METOPROLOL TARTRATE 5 MG/5ML IV SOLN: 5.0000 mg | INTRAVENOUS | Status: DC | PRN

## 2023-05-31 MED ORDER — METOPROLOL TARTRATE 5 MG/5ML IV SOLN: 5.0000 mg | Freq: Once | INTRAVENOUS | Status: DC

## 2023-05-31 MED ORDER — IPRATROPIUM-ALBUTEROL 0.5-2.5 (3) MG/3ML IN SOLN
3.0000 mL | Freq: Four times a day (QID) | RESPIRATORY_TRACT | Status: DC
  Administered 2023-05-31 – 2023-06-04 (×15): 3 mL via RESPIRATORY_TRACT
  Filled 2023-05-31 (×15): qty 3

## 2023-05-31 MED ORDER — LOSARTAN POTASSIUM 25 MG PO TABS
25.0000 mg | ORAL_TABLET | Freq: Every day | ORAL | Status: DC
  Administered 2023-05-31 – 2023-06-01 (×2): 25 mg via ORAL
  Filled 2023-05-31 (×3): qty 1

## 2023-05-31 MED ORDER — HEPARIN BOLUS VIA INFUSION
4000.0000 [IU] | Freq: Once | INTRAVENOUS | Status: AC
  Administered 2023-05-31: 4000 [IU] via INTRAVENOUS
  Filled 2023-05-31: qty 4000

## 2023-05-31 MED ORDER — HEPARIN (PORCINE) 25000 UT/250ML-% IV SOLN
1250.0000 [IU]/h | INTRAVENOUS | Status: DC
  Administered 2023-05-31: 1100 [IU]/h via INTRAVENOUS
  Filled 2023-05-31: qty 250

## 2023-05-31 MED ORDER — IOHEXOL 350 MG/ML SOLN
75.0000 mL | Freq: Once | INTRAVENOUS | Status: AC | PRN
  Administered 2023-05-31: 75 mL via INTRAVENOUS

## 2023-05-31 MED ORDER — NICOTINE 14 MG/24HR TD PT24
14.0000 mg | MEDICATED_PATCH | Freq: Every day | TRANSDERMAL | Status: DC
  Administered 2023-05-31 – 2023-06-09 (×10): 14 mg via TRANSDERMAL
  Filled 2023-05-31 (×10): qty 1

## 2023-05-31 MED ORDER — ACETAMINOPHEN 650 MG RE SUPP: 650.0000 mg | Freq: Four times a day (QID) | RECTAL | Status: DC | PRN

## 2023-05-31 MED ORDER — PANTOPRAZOLE SODIUM 40 MG PO TBEC
40.0000 mg | DELAYED_RELEASE_TABLET | Freq: Every day | ORAL | Status: DC
  Administered 2023-05-31 – 2023-06-09 (×10): 40 mg via ORAL
  Filled 2023-05-31 (×10): qty 1

## 2023-05-31 MED ORDER — SENNOSIDES-DOCUSATE SODIUM 8.6-50 MG PO TABS: 1.0000 | ORAL_TABLET | Freq: Every evening | ORAL | Status: DC | PRN

## 2023-05-31 MED ORDER — FUROSEMIDE 10 MG/ML IJ SOLN
60.0000 mg | Freq: Once | INTRAMUSCULAR | Status: AC
Start: 2023-05-31 — End: 2023-05-31
  Administered 2023-05-31: 60 mg via INTRAVENOUS
  Filled 2023-05-31: qty 6

## 2023-05-31 MED ORDER — DULOXETINE HCL 30 MG PO CPEP
30.0000 mg | ORAL_CAPSULE | Freq: Every day | ORAL | Status: DC
  Administered 2023-05-31 – 2023-06-09 (×10): 30 mg via ORAL
  Filled 2023-05-31 (×10): qty 1

## 2023-05-31 MED ORDER — ARIPIPRAZOLE 5 MG PO TABS
5.0000 mg | ORAL_TABLET | Freq: Every day | ORAL | Status: DC
  Administered 2023-06-01 – 2023-06-09 (×9): 5 mg via ORAL
  Filled 2023-05-31 (×10): qty 1

## 2023-05-31 MED ORDER — RIVAROXABAN 20 MG PO TABS
20.0000 mg | ORAL_TABLET | Freq: Every day | ORAL | Status: DC
  Administered 2023-05-31 – 2023-06-08 (×9): 20 mg via ORAL
  Filled 2023-05-31 (×9): qty 1

## 2023-05-31 MED ORDER — METOPROLOL TARTRATE 5 MG/5ML IV SOLN
5.0000 mg | Freq: Once | INTRAVENOUS | Status: AC
Start: 2023-05-31 — End: 2023-05-31
  Administered 2023-05-31: 5 mg via INTRAVENOUS
  Filled 2023-05-31: qty 5

## 2023-05-31 MED ORDER — ALBUTEROL SULFATE (2.5 MG/3ML) 0.083% IN NEBU: 2.5000 mg | INHALATION_SOLUTION | Freq: Once | RESPIRATORY_TRACT | Status: DC

## 2023-05-31 MED ORDER — GABAPENTIN 300 MG PO CAPS
600.0000 mg | ORAL_CAPSULE | Freq: Three times a day (TID) | ORAL | Status: DC
  Administered 2023-05-31 – 2023-06-02 (×4): 600 mg via ORAL
  Filled 2023-05-31 (×6): qty 2

## 2023-05-31 NOTE — Progress Notes (Signed)
PHARMACY - ANTICOAGULATION CONSULT NOTE  Pharmacy Consult for heparin Indication: atrial fibrillation  Allergies  Allergen Reactions   Pamelor [Nortriptyline Hcl] Anaphylaxis   Vibra-Tab [Doxycycline] Other (See Comments)    Unknown reaction    Patient Measurements: Height: 5\' 5"  (165.1 cm) Weight: 96.9 kg (213 lb 10 oz) IBW/kg (Calculated) : 57 Heparin Dosing Weight: 79kg  Vital Signs: BP: 136/79 (10/21 0830) Pulse Rate: 133 (10/21 0830)  Labs: Recent Labs    05/31/23 0712  HGB 10.6*  HCT 36.1  PLT 297  CREATININE 0.75  TROPONINIHS 26*    Estimated Creatinine Clearance: 79.7 mL/min (by C-G formula based on SCr of 0.75 mg/dL).   Medical History: Past Medical History:  Diagnosis Date   Anxiety    Arthritis    Attention deficit disorder (ADD)    COPD (chronic obstructive pulmonary disease) (HCC)    Depression    Drug abuse (HCC)    Fibromyalgia    HFrEF (heart failure with reduced ejection fraction) (HCC)    Hyperlipidemia    Paroxysmal Atrial fibrillation     Assessment: 71 YOF presenting with SOB, on O2 PTA, hx of afib on Xarelto PTA however states ran out of medications including diuretics a week ago, currently on BiPAP  Goal of Therapy:  Heparin level 0.3-0.7 units/ml aPTT 66-102 seconds Monitor platelets by anticoagulation protocol: Yes   Plan:  Heparin 4000 units IV x 1, and gtt at 1100 units/hr F/u 6 hour aPTT/heparin level F/u ability to transition back to PO   Daylene Posey, PharmD, [facility] Clinical Pharmacist ED Pharmacist Phone # [phone] 05/31/2023 9:12 AM

## 2023-05-31 NOTE — Progress Notes (Signed)
Pt placed on Bipap due to respiratory distress. Pt tolerating well at this time

## 2023-05-31 NOTE — ED Triage Notes (Signed)
Chief Complaint  Patient presents with   Respiratory Distress   Pt presents to ED 22 via EMS from home with above complaint.  Pt endorses shortness of breath for 2-3 days.  Per EMS, pt wheezing upon arrival.  Pt tried to use inhaler at home but EMS reports pt used inhaler incorrectly.  Pt also reports being out of her "fluid pills" for a week.  EMS reports initial room air saturation in 70s.  Increased to 86% with CPAP but pt not tolerating CPAP well.  Pt in A fib w/ RVR but EMS unable to treat due to coaching patient with CPAP for her distress.  Pt denies chest pain; only reports back pain from her sciatica.  Pt received 1 of Atrovent, 15 Albuterol, and 125 mg Solumedrol with EMS.

## 2023-05-31 NOTE — ED Provider Notes (Signed)
[facility] [facility]MENT AT [facility] Provider Note   CSN: [id number] Arrival date & time: 05/31/23  6440     History Chief Complaint  Patient presents with   Respiratory Distress    Autumn [name] is a 67 y.o. female.  Patient with past history significant for COPD, coronary artery disease, acute on chronic systolic heart failure, acute hypoxic respiratory failure, paroxysmal atrial fibrillation presents to the emergency department in respiratory distress.  Patient reports that she has been experiencing worsening shortness of breath for the last week or so.  Coincidentally, she also ran out of her diuretics around that same time.  She denies any chest pain at this time.  No nausea, vomiting, diarrhea, abdominal pain, fevers, chills.  Brought in by EMS on CPAP requiring increased oxygen demand as she typically is on 3 L nasal cannula at home but has required increasing quantity for adequate oxygenation.  Patient currently takes metoprolol for her atrial fibrillation.  HPI     Home Medications Prior to Admission medications   Medication Sig Start Date End Date Taking? Authorizing Provider  albuterol (ACCUNEB) 0.63 MG/3ML nebulizer solution Take 1 ampule by nebulization every 6 (six) hours as needed for wheezing or shortness of breath.    [provider]  Albuterol Sulfate, sensor, 108 (90 Base) MCG/ACT AEPB Inhale 2 puffs into the lungs every 6 (six) hours as needed (wheezing, shortness of breath).    [provider]  ARIPiprazole (ABILIFY) 5 MG tablet Take 1 tablet (5 mg total) by mouth daily. 02/23/23   Morrie Sheldon, MD  atorvastatin (LIPITOR) 80 MG tablet Take 1 tablet (80 mg total) by mouth daily. 12/07/22   Carrion-Carrero, Karle Starch, MD  Budeson-Glycopyrrol-Formoterol (BREZTRI AEROSPHERE) 160-9-4.8 MCG/ACT AERO Inhale 2 puffs into the lungs 2 (two) times daily. 02/23/22   [provider]  carbidopa-levodopa (SINEMET IR) 25-100 MG tablet  Take 1 tablet by mouth 3 (three) times daily. 01/21/23   Rana Snare, DO  dapagliflozin propanediol (FARXIGA) 10 MG TABS tablet Take 1 tablet (10 mg total) by mouth daily. 02/23/23   Dickie La, MD  diclofenac Sodium (VOLTAREN) 1 % GEL Apply 2-4 g topically 4 (four) times daily as needed (pain). 12/18/20   [provider]  digoxin (LANOXIN) 0.25 MG tablet Take 1 tablet (0.25 mg total) by mouth daily. 02/23/23   Morrie Sheldon, MD  DULoxetine (CYMBALTA) 30 MG capsule Take 1 capsule (30 mg total) by mouth daily. 02/23/23 03/25/23  Morrie Sheldon, MD  fluticasone (FLONASE) 50 MCG/ACT nasal spray Place 2 sprays into both nostrils daily as needed for allergies. 02/23/22   [provider]  furosemide (LASIX) 80 MG tablet Take 1 tablet (80 mg total) by mouth daily. 02/22/23   Dickie La, MD  gabapentin (NEURONTIN) 600 MG tablet Take 600 mg by mouth 3 (three) times daily. 09/01/22   [provider]  guaiFENesin-dextromethorphan (ROBITUSSIN DM) 100-10 MG/5ML syrup Take 5 mLs by mouth every 6 (six) hours as needed for cough. 12/06/22   Carrion-Carrero, Karle Starch, MD  losartan (COZAAR) 25 MG tablet Take 1 tablet (25 mg total) by mouth daily. 02/22/23   Morrie Sheldon, MD  melatonin 3 MG TABS tablet Take 1 tablet (3 mg total) by mouth at bedtime as needed. Patient taking differently: Take 3 mg by mouth at bedtime as needed (sleep). 12/06/22   Carrion-Carrero, Karle Starch, MD  metoprolol (TOPROL-XL) 200 MG 24 hr tablet Take 1 tablet (200 mg total) by mouth daily. Take with or immediately following  a meal. 02/22/23   Morrie Sheldon, MD  Multiple Vitamin (MULTIVITAMIN WITH MINERALS) TABS Take 1 tablet by mouth every morning.     [provider]  naloxone [facility]) nasal spray 4 mg/0.1 mL Place 4 mg into the nose as needed (overdose, as needed). 10/06/19   [provider]  pantoprazole (PROTONIX) 40 MG tablet Take 1 tablet (40 mg total) by mouth daily. 01/15/21 03/20/23  [place], Alvira Philips, DO   rivaroxaban (XARELTO) 20 MG TABS tablet Take 1 tablet (20 mg total) by mouth daily with supper. 02/22/23   Morrie Sheldon, MD      Allergies    Pamelor [nortriptyline hcl] and Vibra-tab [doxycycline]    Review of Systems   Review of Systems  Respiratory:  Positive for shortness of breath.   All other systems reviewed and are negative.   Physical Exam Updated Vital Signs BP 128/76   Pulse (!) 56   Resp 17   Ht 5\' 5"  (1.651 m)   Wt 96.9 kg   SpO2 100%   BMI 35.55 kg/m  Physical Exam Vitals and nursing note reviewed.  Constitutional:      General: She is in acute distress.     Appearance: Normal appearance. She is well-developed. She is ill-appearing.  HENT:     Head: Normocephalic and atraumatic.  Eyes:     Conjunctiva/sclera: Conjunctivae normal.  Cardiovascular:     Rate and Rhythm: Regular rhythm. Tachycardia present.     Heart sounds: No murmur heard. Pulmonary:     Effort: Respiratory distress present.     Breath sounds: No stridor. Wheezing present. No rhonchi or rales.  Abdominal:     Palpations: Abdomen is soft.     Tenderness: There is no abdominal tenderness.  Musculoskeletal:        General: No swelling.     Cervical back: Neck supple.  Skin:    General: Skin is warm and dry.     Capillary Refill: Capillary refill takes less than 2 seconds.  Neurological:     Mental Status: She is alert.  Psychiatric:        Mood and Affect: Mood normal.     ED Results / Procedures / Treatments   Labs (all labs ordered are listed, but only abnormal results are displayed) Labs Reviewed  CBC WITH DIFFERENTIAL/PLATELET - Abnormal; Notable for the following components:      Result Value   Hemoglobin 10.6 (*)    MCHC 29.4 (*)    RDW 17.0 (*)    Neutro Abs 8.1 (*)    All other components within normal limits  COMPREHENSIVE METABOLIC PANEL - Abnormal; Notable for the following components:   Glucose, Bld 177 (*)    BUN 7 (*)    Albumin 2.9 (*)    All other  components within normal limits  BRAIN NATRIURETIC PEPTIDE - Abnormal; Notable for the following components:   B Natriuretic Peptide 606.9 (*)    All other components within normal limits  DIGOXIN LEVEL - Abnormal; Notable for the following components:   Digoxin Level <0.2 (*)    All other components within normal limits  I-STAT VENOUS BLOOD GAS, ED - Abnormal; Notable for the following components:   pH, Ven 7.205 (*)    pCO2, Ven 71.0 (*)    pO2, Ven 195 (*)    Bicarbonate 28.1 (*)    Calcium, Ion 1.14 (*)    HCT 35.0 (*)    Hemoglobin 11.9 (*)    All  other components within normal limits  I-STAT CG4 LACTIC ACID, ED - Abnormal; Notable for the following components:   Lactic Acid, Venous 4.6 (*)    All other components within normal limits  I-STAT CHEM 8, ED - Abnormal; Notable for the following components:   Glucose, Bld 175 (*)    All other components within normal limits  I-STAT CG4 LACTIC ACID, ED - Abnormal; Notable for the following components:   Lactic Acid, Venous 4.6 (*)    All other components within normal limits  I-STAT ARTERIAL BLOOD GAS, ED - Abnormal; Notable for the following components:   pH, Arterial 7.255 (*)    pCO2 arterial 61.3 (*)    pO2, Arterial 377 (*)    HCT 34.0 (*)    Hemoglobin 11.6 (*)    All other components within normal limits  I-STAT VENOUS BLOOD GAS, ED - Abnormal; Notable for the following components:   pH, Ven 7.205 (*)    pCO2, Ven 71.0 (*)    pO2, Ven 195 (*)    Bicarbonate 28.1 (*)    Calcium, Ion 1.14 (*)    HCT 35.0 (*)    Hemoglobin 11.9 (*)    All other components within normal limits  I-STAT CHEM 8, ED - Abnormal; Notable for the following components:   Glucose, Bld 175 (*)    All other components within normal limits  I-STAT CG4 LACTIC ACID, ED - Abnormal; Notable for the following components:   Lactic Acid, Venous 4.6 (*)    All other components within normal limits  I-STAT VENOUS BLOOD GAS, ED - Abnormal; Notable for the  following components:   pH, Ven 7.205 (*)    pCO2, Ven 71.0 (*)    pO2, Ven 195 (*)    Bicarbonate 28.1 (*)    Calcium, Ion 1.14 (*)    HCT 35.0 (*)    Hemoglobin 11.9 (*)    All other components within normal limits  I-STAT CHEM 8, ED - Abnormal; Notable for the following components:   Glucose, Bld 175 (*)    All other components within normal limits  I-STAT CG4 LACTIC ACID, ED - Abnormal; Notable for the following components:   Lactic Acid, Venous 2.7 (*)    All other components within normal limits  TROPONIN I (HIGH SENSITIVITY) - Abnormal; Notable for the following components:   Troponin I (High Sensitivity) 26 (*)    All other components within normal limits  TROPONIN I (HIGH SENSITIVITY) - Abnormal; Notable for the following components:   Troponin I (High Sensitivity) 30 (*)    All other components within normal limits  RESP PANEL BY RT-PCR (RSV, FLU A&B, COVID)  RVPGX2  MAGNESIUM  TSH  HEPARIN LEVEL (UNFRACTIONATED)  APTT  D-DIMER, QUANTITATIVE    EKG EKG Interpretation Date/Time:  Monday May 31 2023 07:30:53 EDT Ventricular Rate:  147 PR Interval:    QRS Duration:  90 QT Interval:  329 QTC Calculation: 515 R Axis:   91  Text Interpretation: Atrial fibrillation Right axis deviation Borderline T abnormalities, inferior leads Prolonged QT interval Confirmed by Gloris Manchester ([id number]) on 05/31/2023 7:34:15 AM  Radiology DG Chest Portable 1 View  Result Date: 05/31/2023 CLINICAL DATA:  Shortness of breath. EXAM: PORTABLE CHEST 1 VIEW COMPARISON:  02/21/2023. FINDINGS: There is congestive heart failure/pulmonary edema characterized by increased interstitial markings with hilar and basilar predominance and bilateral small-to-moderate layering effusions. No pneumothorax. Stable moderately enlarged cardio-mediastinal silhouette. No acute osseous abnormalities. The soft tissues are within  normal limits. IMPRESSION: *Congestive heart failure/pulmonary edema with bilateral  small-to-moderate layering effusions. Electronically Signed   By: Jules Schick M.D.   On: 05/31/2023 08:08    Procedures .Critical Care  Performed by: Smitty Knudsen, PA-C Authorized by: Smitty Knudsen, PA-C   Critical care provider statement:    Critical care time (minutes):  36   Critical care start time:  05/31/2023 9:44 AM   Critical care end time:  05/31/2023 10:20 AM   Critical care time was exclusive of:  Separately billable procedures and treating other patients   Critical care was necessary to treat or prevent imminent or life-threatening deterioration of the following conditions:  Respiratory failure   Critical care was time spent personally by me on the following activities:  Blood draw for specimens, development of treatment plan with patient or surrogate, evaluation of patient's response to treatment, review of old charts, re-evaluation of patient's condition, ordering and review of radiographic studies, ordering and review of laboratory studies and ordering and performing treatments and interventions    Medications Ordered in ED Medications  heparin ADULT infusion 100 units/mL (25000 units/263mL) (1,100 Units/hr Intravenous New Bag/Given 05/31/23 0943)  atorvastatin (LIPITOR) tablet 80 mg (has no administration in time range)  losartan (COZAAR) tablet 25 mg (has no administration in time range)  ARIPiprazole (ABILIFY) tablet 5 mg (has no administration in time range)  DULoxetine (CYMBALTA) DR capsule 30 mg (has no administration in time range)  dapagliflozin propanediol (FARXIGA) tablet 10 mg (has no administration in time range)  pantoprazole (PROTONIX) EC tablet 40 mg (has no administration in time range)  melatonin tablet 3 mg (has no administration in time range)  gabapentin (NEURONTIN) tablet 600 mg (has no administration in time range)  diclofenac Sodium (VOLTAREN) 1 % topical gel 2-4 g (has no administration in time range)  acetaminophen (TYLENOL) tablet 650 mg  (has no administration in time range)    Or  acetaminophen (TYLENOL) suppository 650 mg (has no administration in time range)  senna-docusate (Senokot-S) tablet 1 tablet (has no administration in time range)  ipratropium-albuterol (DUONEB) 0.5-2.5 (3) MG/3ML nebulizer solution 3 mL (has no administration in time range)  nicotine (NICODERM CQ - dosed in mg/24 hours) patch 14 mg (has no administration in time range)  furosemide (LASIX) injection 60 mg (60 mg Intravenous Given 05/31/23 0835)  metoprolol tartrate (LOPRESSOR) injection 5 mg (5 mg Intravenous Given 05/31/23 0914)  heparin bolus via infusion 4,000 Units (4,000 Units Intravenous Bolus from Bag 05/31/23 0943)    ED Course/ Medical Decision Making/ A&P                               Medical Decision Making Amount and/or Complexity of Data Reviewed Labs: ordered. Radiology: ordered.  Risk Prescription drug management. Decision regarding hospitalization.   This patient presents to the ED for concern of respiratory distress.  Differential diagnosis includes respiratory failure with acute hypoxia, sepsis, PE, pneumonia    Additional history obtained:  Additional history obtained from ED to hospital admission on 02/17/2023 External records from outside source obtained and reviewed including patient with similar symptoms admitted for acute on chronic congestive heart failure, paroxysmal atrial fibrillation   Lab Tests:  I Ordered, and personally interpreted labs.  The pertinent results include: CBC without any significant abnormalities no leukocytosis, CMP largely unremarkable with mild hyperglycemia 177, BNP elevated at 606, troponin slightly elevated at 26 with delta at 30 likely  due to demand, lactic acid at 4.6 and improved to 2.7 after continue manage on BiPAP, i-STAT arterial blood with pH at 7.25 indicating respiratory acidosis   Imaging Studies ordered:  I ordered imaging studies including chest x-ray I independently  visualized and interpreted imaging which showed congestive heart failure/pulmonary edema I agree with the radiologist interpretation   Medicines ordered and prescription drug management:  I ordered medication including Lasix, Lopressor, heparin bolus for fluid overload, atrial fibrillation, anticoagulation Reevaluation of the patient after these medicines showed that the patient improved I have reviewed the patients home medicines and have made adjustments as needed   Problem List / ED Course:  Patient with significant comorbidities including atrial fibrillation, COPD, HFrEF, prior episodes of hypoxic respiratory failure. States she has been having increasing SOB for the last week or so at which time she ran out of her diuretic, blood thinner, and other home medications. Has also been requiring increasing oxygen use at home. Has noted some increased swelling in legs but no quantified weight gain. Respiratory evaluated patient at bedside and recommend Bi-PAP for patient as CPAP not tolerated. No obvious signs of wheezing, rales, or rhonchi. However patient has poor lung sounds. Bilateral legs with pitting edema noted. Suspect SOB likely secondary to CHF exacerbation. Potentially secondary to atrial fibrillation and consider PE as patient has been off of blood thinner and metoprolol for several days. Patient started on Lasix, metoprolol, and heparin given CHF exacerbation, atrial fibrillation, and no recent anticoagulants. Will consult to medicine for admission given fluid overload status and respiratory failure with hypoxemia.   Spoke with Dr.Mullen, internal medicine teaching service, as well as resident who will come by to assess patient for admission. Asked to add on d-dimer given elevated Wells Score. D-dimer ordered.  Final Clinical Impression(s) / ED Diagnoses Final diagnoses:  Acute on chronic congestive heart failure, unspecified heart failure type (HCC)  Paroxysmal atrial fibrillation  (HCC)  Acute respiratory failure with hypoxia [facility])    Rx / DC Orders ED Discharge Orders     None         Smitty Knudsen, PA-C 05/31/23 1527    Gloris Manchester, MD 05/31/23 1700

## 2023-05-31 NOTE — Progress Notes (Signed)
Pt took bipap mask off. Pt placed back on [place]. No distress noted at this time

## 2023-05-31 NOTE — H&P (Signed)
Date: 05/31/2023               Patient Name:  Autumn [name] MRN: [id number]  DOB: 1956-08-05 Age / Sex: 67 y.o., female   PCP: Vivien Presto, MD         Medical Service: Internal Medicine Teaching Service         Attending Physician: Dr. Inez Catalina, MD       *Please do not leave messages for physician in this sticky note. Please page appropriate physician as below.*   Weekday Hours (7AM-5PM):   First Contact: Lorel Monaco        Pager: [phone]        Second Contact: Marrianne Mood, MD    Pager: MM [phone]    ** If no return call within 15 minutes (after trying both pagers listed above), please call after hours pagers.   After 5 pm or weekends:  1st Contact: Pager: [phone]  2nd Contact: Pager: [phone]     Chief Complaint: shortness of breath  History of Present Illness:  Pt is a 67 year old female with past medical hx of PMHx of HFrEF (EF 35%), A fib with RVR, chronic hypoxic respiratory failure, T2DM, polysubstance use presenting to the ED with worsening shortness of breath.   Pt was somnolent on my evaluation and was on Bipap. She awakened to voice but required multiple prompts to answer my questions. She reported missing her medications for one week. States she has been feeling poorly for one week now and it had progressed to point making her come in to the ED.   She denies any chest pain and states breathing is improved on the BiPAP. When asked why did she not take her medications for one week, she stated she ran out of them.   Review of Systems negative unless stated in the HPI.  In the ED, she was placed on bipap. Lab work and imaging were ordered that showed pH 7.2 pCO2 was 71 and pO2 was 195.   Past Medical History: Past Medical History:  Diagnosis Date   Anxiety    Arthritis    Attention deficit disorder (ADD)    COPD (chronic obstructive pulmonary disease) (HCC)    Depression    Drug abuse (HCC)     Fibromyalgia    HFrEF (heart failure with reduced ejection fraction) (HCC)    Hyperlipidemia    Paroxysmal Atrial fibrillation     Meds: Current Outpatient Medications  Medication Instructions   albuterol (ACCUNEB) 0.63 MG/3ML nebulizer solution 1 ampule, Nebulization, Every 6 hours PRN   albuterol (VENTOLIN HFA) 108 (90 Base) MCG/ACT inhaler 2 puffs, Inhalation, Every 4 hours PRN   Albuterol Sulfate, sensor, 108 (90 Base) MCG/ACT AEPB 2 puffs, Inhalation, Every 6 hours PRN   ARIPiprazole (ABILIFY) 5 mg, Oral, Daily   atorvastatin (LIPITOR) 80 mg, Oral, Daily   atorvastatin (LIPITOR) 40 mg, Oral, Daily   Budeson-Glycopyrrol-Formoterol (BREZTRI AEROSPHERE) 160-9-4.8 MCG/ACT AERO 2 puffs, Inhalation, 2 times daily   carbidopa-levodopa (SINEMET IR) 25-100 MG tablet 1 tablet, Oral, 3 times daily   dapagliflozin propanediol (FARXIGA) 10 mg, Oral, Daily   diclofenac Sodium (VOLTAREN) 2-4 g, Topical, 4 times daily PRN   digoxin (LANOXIN) 0.25 mg, Oral, Daily   DULoxetine (CYMBALTA) 30 mg, Oral, Daily   fluticasone (FLONASE) 50 MCG/ACT nasal spray 2 sprays, Each Nare, Daily PRN   furosemide (LASIX) 80 mg, Oral, Daily   gabapentin (NEURONTIN) 600 mg, Oral, 3 times daily  guaiFENesin-dextromethorphan (ROBITUSSIN DM) 100-10 MG/5ML syrup 5 mLs, Oral, Every 6 hours PRN   losartan (COZAAR) 25 mg, Oral, Daily   melatonin 3 mg, Oral, At bedtime PRN   metoprolol (TOPROL-XL) 200 mg, Oral, Daily, Take with or immediately following a meal.   Multiple Vitamin (MULTIVITAMIN WITH MINERALS) TABS 1 tablet, Oral, Every morning   naloxone (NARCAN) 4 mg, Nasal, As needed   pantoprazole (PROTONIX) 40 mg, Oral, Daily   Xarelto 20 mg, Oral, Daily with supper    Allergies: Allergies as of 05/31/2023 - Review Complete 05/31/2023  Allergen Reaction Noted   Pamelor [nortriptyline hcl] Anaphylaxis 11/07/2018   Vibra-tab [doxycycline] Other (See Comments)     Past Surgical History: Past Surgical History:   Procedure Laterality Date   ANKLE SURGERY     FACIAL COSMETIC SURGERY      Family History: Unable to verify as pt is somnolent.   Social History:  Lives with grand kids since April.  Tobacco- 1/2 ppd 20 years since age EtOH- Denies use.  Illicit drug use- denies use.  IADLs/ADLs- can person independently at baseline   Physical Exam: Blood pressure 128/76, pulse 108 resp. rate 17, height 5\' 5"  (1.651 m), weight 96.9 kg, SpO2 100%. General: somnolent but awakens to voice HENT: on bipap Lungs: lungs with coarse breath sounds, mild crackles appreciated at bases Cardiovascular: IRIR, tachycardic rate, 3+ LE edema Abdomen: Normal bowel sounds, No TTP of abdomen MSK: no asymmetry in upper or lower extremities Skin: no lesions noted on skin Neuro: somnolent but easily awakens, answering all questions appropriately but does require multiple prompting. On my assessment, she is oriented x4 Psych: normal mood and normal affect  Diagnostics:     Latest Ref Rng & Units 05/31/2023    8:09 AM 05/31/2023    8:07 AM 05/31/2023    7:12 AM  CBC  WBC 4.0 - 10.5 K/uL   10.4   Hemoglobin 12.0 - 15.0 g/dL 16.1 - 09.6 g/dL 04.5 - 40.9 g/dL 81.1    91.4    78.2  95.6    11.9    11.9  10.6   Hematocrit 36.0 - 46.0 % 36.0 - 46.0 % 36.0 - 46.0 % 36.0    36.0    36.0  35.0    35.0    35.0  36.1   Platelets 150 - 400 K/uL   297        Latest Ref Rng & Units 05/31/2023    8:09 AM 05/31/2023    8:07 AM 05/31/2023    7:12 AM  CMP  Glucose 70 - 99 mg/dL 70 - 99 mg/dL 70 - 99 mg/dL 213    086    578   469   BUN 8 - 23 mg/dL 8 - 23 mg/dL 8 - 23 mg/dL 8    8    8   7    Creatinine 0.44 - 1.00 mg/dL 6.29 - 5.28 mg/dL 4.13 - 2.44 mg/dL 0.10    2.72    5.36   0.75   Sodium 135 - 145 mmol/L 135 - 145 mmol/L 135 - 145 mmol/L 140    140    140  139    139    139  139   Potassium 3.5 - 5.1 mmol/L 3.5 - 5.1 mmol/L 3.5 - 5.1 mmol/L 4.3    4.3    4.3  4.4    4.4    4.4  4.3    Chloride 98 - 111  mmol/L 98 - 111 mmol/L 98 - 111 mmol/L 103    103    103   103   CO2 22 - 32 mmol/L   24   Calcium 8.9 - 10.3 mg/dL   8.9   Total Protein 6.5 - 8.1 g/dL   6.9   Total Bilirubin 0.3 - 1.2 mg/dL   0.8   Alkaline Phos 38 - 126 U/L   90   AST 15 - 41 U/L   24   ALT 0 - 44 U/L   21     DG Chest Portable 1 View  Result Date: 05/31/2023 CLINICAL DATA:  Shortness of breath. EXAM: PORTABLE CHEST 1 VIEW COMPARISON:  02/21/2023. FINDINGS: There is congestive heart failure/pulmonary edema characterized by increased interstitial markings with hilar and basilar predominance and bilateral small-to-moderate layering effusions. No pneumothorax. Stable moderately enlarged cardio-mediastinal silhouette. No acute osseous abnormalities. The soft tissues are within normal limits. IMPRESSION: *Congestive heart failure/pulmonary edema with bilateral small-to-moderate layering effusions. Electronically Signed   By: Jules Schick M.D.   On: 05/31/2023 08:08     EKG: personally reviewed my interpretation is afib with RVR  CXR: personally reviewed my interpretation is diffuse pulmonary edema with bilateral pleural effusions.   Assessment & Plan by Problem:  Acute Heart Failure Exacerbation Pt with worsening dyspnea in the setting of noncompliance to her medications with objective data consistent with heart failure exacerbation with elevated BNP and CXR showing pulmonary edema with pleural effusion. Pt has improved with IV lasix and Bipap. PE was considered given hypoxia and tachycardia and D-dimer was checked which was elevated. She is getting a stat CTA. She has been off her [facility] but this is for her afib. Currently on heparin gtt. Plan to diurese pt and wean off bipap and start home medications.  -IV lasix 40 mg BID -Strict input/output -follow up echo -- Trend BMP, follow mag (goal K>4 and Mag>2)  Atrial fibrillation with RVR Improving with HR in 100s-110. Was not on any anticoagulation for  one week. Is on heparin gtt. Will get echo to look for any thrombus formation. Will use IV metoprolol prn as needed to control HR. Will not start digoxin as it can lead to rhythm control which can embolize any potential clot.  -Continue heparin gtt -follow up echo -Continue IV lopressor but start oral metoprolol once able.   Chronic Problems MDD: continue home meds once able.  HLD: continue home meds. LDL was 109 in 11/2022 COPD: Continue nebulizers and start inhalers once able ADHD: Last fill of adderal was in 2022. Will hold on this until this can be further discussed.  Arthritis:home tylenol and voltaren gel OUD: Continue home suboxone  DVT prophx: heparin gtt Diet: NPO Bowel: PRN Code: Full  Prior to Admission Living Arrangement: Home Anticipated Discharge Location: Home Barriers to Discharge: Medical Workup  Dispo: Admit patient to Inpatient with expected length of stay greater than 2 midnights.  Gwenevere Abbot, MD Eligha Bridegroom. [facility] Internal Medicine Residency, PGY-3 Pager: [phone]

## 2023-05-31 NOTE — ED Notes (Signed)
Patient HR 130-150's, Zelaya PA made aware

## 2023-05-31 NOTE — Progress Notes (Signed)
PHARMACY - ANTICOAGULATION CONSULT NOTE  Pharmacy Consult for heparin Indication: atrial fibrillation  Allergies  Allergen Reactions   Pamelor [Nortriptyline Hcl] Anaphylaxis   Vibra-Tab [Doxycycline] Other (See Comments)    Unknown reaction    Patient Measurements: Height: 5\' 5"  (165.1 cm) Weight: 96.9 kg (213 lb 10 oz) IBW/kg (Calculated) : 57 Heparin Dosing Weight: 79kg  Vital Signs: Temp: 98.7 F (37.1 C) (10/21 1440) Temp Source: Oral (10/21 1440) BP: 112/75 (10/21 1700) Pulse Rate: 63 (10/21 1715)  Labs: Recent Labs    05/31/23 0712 05/31/23 0807 05/31/23 0809 05/31/23 0918 05/31/23 1445 05/31/23 1707  HGB 10.6* 11.9*  11.9*  11.9* 12.2  12.2  12.2  --  11.6*  --   HCT 36.1 35.0*  35.0*  35.0* 36.0  36.0  36.0  --  34.0*  --   PLT 297  --   --   --   --   --   APTT  --   --   --   --   --  32  HEPARINUNFRC  --   --   --   --   --  0.16*  CREATININE 0.75  --  0.70  0.70  0.70  --   --   --   TROPONINIHS 26*  --   --  30*  --   --     Estimated Creatinine Clearance: 79.7 mL/min (by C-G formula based on SCr of 0.7 mg/dL).   Medical History: Past Medical History:  Diagnosis Date   Anxiety    Arthritis    Attention deficit disorder (ADD)    COPD (chronic obstructive pulmonary disease) (HCC)    Depression    Drug abuse (HCC)    Fibromyalgia    HFrEF (heart failure with reduced ejection fraction) (HCC)    Hyperlipidemia    Paroxysmal Atrial fibrillation     Assessment: Autumn [name] presenting with SOB, on O2 PTA, hx of afib on Xarelto PTA however states ran out of medications including diuretics a week ago, currently on BiPAP  Heparin level subtherapeutic at 0.16 while on heparin 1100 u/hr. aPTT correlating at 32. HgB 11.6 and PLTs 297. No issues reported. CTA is pending.   Goal of Therapy:  Heparin level 0.3-0.7 units/ml aPTT 66-102 seconds Monitor platelets by anticoagulation protocol: Yes   Plan:  Increase gtt to 1250u.hr.  F/u 6 hour  heparin level, aPTT correlating. Patient has been without for a week.  F/u ability to transition back to PO  Estill Batten, PharmD, [facility]  Clinical Pharmacist ED Pharmacist Phone # [phone] 05/31/2023 6:10 PM

## 2023-05-31 NOTE — Progress Notes (Signed)
Date and time results received: 05/31/23 1923 (use smartphrase ".now" to insert current time)  Test: lactic acid Critical Value: 2.9  Name of Provider Notified: [place]  Orders Received? Or Actions Taken?: Mendy RN notified. Teaching service paged.

## 2023-05-31 NOTE — ED Notes (Addendum)
Patient HR 130-150's, Dixon MD made aware.

## 2023-05-31 NOTE — ED Notes (Signed)
ED TO INPATIENT HANDOFF REPORT  ED Nurse Name and Phone #: Beatris Ship RN [phone]  S Name/Age/Gender Autumn [name] 67 y.o. female Room/Bed: 009C/009C  Code Status   Code Status: Full Code  Home/SNF/Other Home Patient oriented to: self, place, time, and situation Is this baseline? Yes   Triage Complete: Triage complete  Chief Complaint Acute exacerbation of CHF (congestive heart failure) (HCC) [I50.9]  Triage Note Chief Complaint  Patient presents with   Respiratory Distress   Pt presents to ED 22 via EMS from home with above complaint.  Pt endorses shortness of breath for 2-3 days.  Per EMS, pt wheezing upon arrival.  Pt tried to use inhaler at home but EMS reports pt used inhaler incorrectly.  Pt also reports being out of her "fluid pills" for a week.  EMS reports initial room air saturation in 70s.  Increased to 86% with CPAP but pt not tolerating CPAP well.  Pt in A fib w/ RVR but EMS unable to treat due to coaching patient with CPAP for her distress.  Pt denies chest pain; only reports back pain from her sciatica.  Pt received 1 of Atrovent, 15 Albuterol, and 125 mg Solumedrol with EMS.   Allergies Allergies  Allergen Reactions   Pamelor [Nortriptyline Hcl] Anaphylaxis   Vibra-Tab [Doxycycline] Other (See Comments)    Unknown reaction    Level of Care/Admitting Diagnosis ED Disposition     ED Disposition  Admit   Condition  --   Comment  Hospital Area: [facility] [[id number]]  Level of Care: Progressive [102]  Admit to Progressive based on following criteria: CARDIOVASCULAR & THORACIC of moderate stability with acute coronary syndrome symptoms/low risk myocardial infarction/hypertensive urgency/arrhythmias/heart failure potentially compromising stability and stable post cardiovascular intervention patients.  May admit patient to [facility] or [facility] if equivalent level of care is available:: No  Covid Evaluation: Asymptomatic - no recent  exposure (last 10 days) testing not required  Diagnosis: Acute exacerbation of CHF (congestive heart failure) [facility]) [[id number]]  Admitting Physician: Nena Polio  Attending Physician: Inez Catalina [phone]  Certification:: I certify this patient will need inpatient services for at least 2 midnights  Expected Medical Readiness: 06/03/2023          B Medical/Surgery History Past Medical History:  Diagnosis Date   Anxiety    Arthritis    Attention deficit disorder (ADD)    COPD (chronic obstructive pulmonary disease) (HCC)    Depression    Drug abuse (HCC)    Fibromyalgia    HFrEF (heart failure with reduced ejection fraction) (HCC)    Hyperlipidemia    Paroxysmal Atrial fibrillation    Past Surgical History:  Procedure Laterality Date   ANKLE SURGERY     FACIAL COSMETIC SURGERY       A IV Location/Drains/Wounds Patient Lines/Drains/Airways Status     Active Line/Drains/Airways     Name Placement date Placement time Site Days   Peripheral IV 05/31/23 20 G Right Hand 05/31/23  --  Hand  less than 1   Peripheral IV 05/31/23 20 G Anterior;Left;Upper Arm 05/31/23  1439  Arm  less than 1   External Urinary Catheter 05/31/23  0901  --  less than 1            Intake/Output Last 24 hours No intake or output data in the 24 hours ending 05/31/23 1829  Labs/Imaging Results for orders placed or performed during the hospital encounter of 05/31/23 (  from the past 48 hour(s))  I-Stat arterial blood gas, ED     Status: Abnormal   Collection Time: 05/31/23  7:03 AM  Result Value Ref Range   pH, Arterial 7.255 (L) 7.35 - 7.45   pCO2 arterial 61.3 (H) 32 - 48 mmHg   pO2, Arterial 377 (H) 83 - 108 mmHg   Bicarbonate 27.2 20.0 - 28.0 mmol/L   TCO2 29 22 - 32 mmol/L   O2 Saturation 100 %   Acid-base deficit 1.0 0.0 - 2.0 mmol/L   Sodium 138 135 - 145 mmol/L   Potassium 4.3 3.5 - 5.1 mmol/L   Calcium, Ion 1.19 1.15 - 1.40 mmol/L   HCT 34.0 (L) 36.0 - 46.0 %    Hemoglobin 11.6 (L) 12.0 - 15.0 g/dL   Patient temperature 21.3 C    Sample type ARTERIAL   CBC with Differential     Status: Abnormal   Collection Time: 05/31/23  7:12 AM  Result Value Ref Range   WBC 10.4 4.0 - 10.5 K/uL   RBC 3.92 3.87 - 5.11 MIL/uL   Hemoglobin 10.6 (L) 12.0 - 15.0 g/dL   HCT 08.6 57.8 - 46.9 %   MCV 92.1 80.0 - 100.0 fL   MCH 27.0 26.0 - 34.0 pg   MCHC 29.4 (L) 30.0 - 36.0 g/dL   RDW 62.9 (H) 52.8 - 41.3 %   Platelets 297 150 - 400 K/uL   nRBC 0.0 0.0 - 0.2 %   Neutrophils Relative % 78 %   Neutro Abs 8.1 (H) 1.7 - 7.7 K/uL   Lymphocytes Relative 13 %   Lymphs Abs 1.4 0.7 - 4.0 K/uL   Monocytes Relative 7 %   Monocytes Absolute 0.7 0.1 - 1.0 K/uL   Eosinophils Relative 2 %   Eosinophils Absolute 0.2 0.0 - 0.5 K/uL   Basophils Relative 0 %   Basophils Absolute 0.0 0.0 - 0.1 K/uL   Immature Granulocytes 0 %   Abs Immature Granulocytes 0.04 0.00 - 0.07 K/uL    Comment: Performed at [facility], [address] [place], [place] [address]  Comprehensive metabolic panel     Status: Abnormal   Collection Time: 05/31/23  7:12 AM  Result Value Ref Range   Sodium 139 135 - 145 mmol/L   Potassium 4.3 3.5 - 5.1 mmol/L   Chloride 103 98 - 111 mmol/L   CO2 24 22 - 32 mmol/L   Glucose, Bld 177 (H) 70 - 99 mg/dL    Comment: Glucose reference range applies only to samples taken after fasting for at least 8 hours.   BUN 7 (L) 8 - 23 mg/dL   Creatinine, Ser 0.27 0.44 - 1.00 mg/dL   Calcium 8.9 8.9 - 25.3 mg/dL   Total Protein 6.9 6.5 - 8.1 g/dL   Albumin 2.9 (L) 3.5 - 5.0 g/dL   AST 24 15 - 41 U/L   ALT 21 0 - 44 U/L   Alkaline Phosphatase 90 38 - 126 U/L   Total Bilirubin 0.8 0.3 - 1.2 mg/dL   GFR, Estimated >66 >44 mL/min    Comment: (NOTE) Calculated using the CKD-EPI Creatinine Equation (2021)    Anion gap 12 5 - 15    Comment: Performed at [facility], [address] [place], [place] [address]  Brain natriuretic peptide     Status: Abnormal    Collection Time: 05/31/23  7:12 AM  Result Value Ref Range   B Natriuretic Peptide 606.9 (  H) 0.0 - 100.0 pg/mL    Comment: Performed at [facility], [address], [place], [place] [address]  Troponin I (High Sensitivity)     Status: Abnormal   Collection Time: 05/31/23  7:12 AM  Result Value Ref Range   Troponin I (High Sensitivity) 26 (H) <18 ng/L    Comment: (NOTE) Elevated high sensitivity troponin I (hsTnI) values and significant  changes across serial measurements may suggest ACS but many other  chronic and acute conditions are known to elevate hsTnI results.  Refer to the "Links" section for chest pain algorithms and additional  guidance. Performed at [facility], [address], [place], [place] [address]   Magnesium     Status: None   Collection Time: 05/31/23  7:12 AM  Result Value Ref Range   Magnesium 2.0 1.7 - 2.4 mg/dL    Comment: Performed at [facility], [address] [place], [place] [address]  TSH     Status: None   Collection Time: 05/31/23  7:12 AM  Result Value Ref Range   TSH 2.703 0.350 - 4.500 uIU/mL    Comment: Performed by a 3rd Generation assay with a functional sensitivity of <=0.01 uIU/mL. Performed at [facility], [address], [place], [place] [address]   Digoxin level     Status: Abnormal   Collection Time: 05/31/23  7:21 AM  Result Value Ref Range   Digoxin Level <0.2 (L) 0.8 - 2.0 ng/mL    Comment: RESULT CONFIRMED BY MANUAL DILUTION Performed at [facility], [address] [place], [place] [address]   I-Stat CG4 Lactic Acid     Status: Abnormal   Collection Time: 05/31/23  8:05 AM  Result Value Ref Range   Lactic Acid, Venous 4.6 (HH) 0.5 - 1.9 mmol/L   Comment NOTIFIED PHYSICIAN   I-Stat CG4 Lactic Acid     Status: Abnormal   Collection Time: 05/31/23  8:05 AM  Result Value Ref Range   Lactic Acid, Venous 4.6 (HH) 0.5 - 1.9 mmol/L   Comment NOTIFIED PHYSICIAN   I-Stat CG4 Lactic Acid, ED      Status: Abnormal   Collection Time: 05/31/23  8:05 AM  Result Value Ref Range   Lactic Acid, Venous 4.6 (HH) 0.5 - 1.9 mmol/L   Comment NOTIFIED PHYSICIAN   I-Stat venous blood gas, ([facility] ED, [facility], [facility])     Status: Abnormal   Collection Time: 05/31/23  8:07 AM  Result Value Ref Range   pH, Ven 7.205 (L) 7.25 - 7.43   pCO2, Ven 71.0 (HH) 44 - 60 mmHg   pO2, Ven 195 (H) 32 - 45 mmHg   Bicarbonate 28.1 (H) 20.0 - 28.0 mmol/L   TCO2 30 22 - 32 mmol/L   O2 Saturation 99 %   Acid-base deficit 1.0 0.0 - 2.0 mmol/L   Sodium 139 135 - 145 mmol/L   Potassium 4.4 3.5 - 5.1 mmol/L   Calcium, Ion 1.14 (L) 1.15 - 1.40 mmol/L   HCT 35.0 (L) 36.0 - 46.0 %   Hemoglobin 11.9 (L) 12.0 - 15.0 g/dL   Sample type VENOUS    Comment NOTIFIED PHYSICIAN   I-Stat venous blood gas, ED     Status: Abnormal   Collection Time: 05/31/23  8:07 AM  Result Value Ref Range   pH, Ven 7.205 (L) 7.25 - 7.43   pCO2, Ven 71.0 (HH) 44 - 60 mmHg   pO2, Ven 195 (  H) 32 - 45 mmHg   Bicarbonate 28.1 (H) 20.0 - 28.0 mmol/L   TCO2 30 22 - 32 mmol/L   O2 Saturation 99 %   Acid-base deficit 1.0 0.0 - 2.0 mmol/L   Sodium 139 135 - 145 mmol/L   Potassium 4.4 3.5 - 5.1 mmol/L   Calcium, Ion 1.14 (L) 1.15 - 1.40 mmol/L   HCT 35.0 (L) 36.0 - 46.0 %   Hemoglobin 11.9 (L) 12.0 - 15.0 g/dL   Sample type VENOUS    Comment NOTIFIED PHYSICIAN   I-Stat venous blood gas, ED     Status: Abnormal   Collection Time: 05/31/23  8:07 AM  Result Value Ref Range   pH, Ven 7.205 (L) 7.25 - 7.43   pCO2, Ven 71.0 (HH) 44 - 60 mmHg   pO2, Ven 195 (H) 32 - 45 mmHg   Bicarbonate 28.1 (H) 20.0 - 28.0 mmol/L   TCO2 30 22 - 32 mmol/L   O2 Saturation 99 %   Acid-base deficit 1.0 0.0 - 2.0 mmol/L   Sodium 139 135 - 145 mmol/L   Potassium 4.4 3.5 - 5.1 mmol/L   Calcium, Ion 1.14 (L) 1.15 - 1.40 mmol/L   HCT 35.0 (L) 36.0 - 46.0 %   Hemoglobin 11.9 (L) 12.0 - 15.0 g/dL   Sample type VENOUS    Comment NOTIFIED PHYSICIAN   I-stat chem 8, ED (not at  [facility], [facility] or [facility])     Status: Abnormal   Collection Time: 05/31/23  8:09 AM  Result Value Ref Range   Sodium 140 135 - 145 mmol/L   Potassium 4.3 3.5 - 5.1 mmol/L   Chloride 103 98 - 111 mmol/L   BUN 8 8 - 23 mg/dL   Creatinine, Ser 0.93 0.44 - 1.00 mg/dL   Glucose, Bld 235 (H) 70 - 99 mg/dL    Comment: Glucose reference range applies only to samples taken after fasting for at least 8 hours.   Calcium, Ion 1.15 1.15 - 1.40 mmol/L   TCO2 28 22 - 32 mmol/L   Hemoglobin 12.2 12.0 - 15.0 g/dL   HCT 57.3 22.0 - 25.4 %  I-stat chem 8, ed     Status: Abnormal   Collection Time: 05/31/23  8:09 AM  Result Value Ref Range   Sodium 140 135 - 145 mmol/L   Potassium 4.3 3.5 - 5.1 mmol/L   Chloride 103 98 - 111 mmol/L   BUN 8 8 - 23 mg/dL   Creatinine, Ser 2.70 0.44 - 1.00 mg/dL   Glucose, Bld 623 (H) 70 - 99 mg/dL    Comment: Glucose reference range applies only to samples taken after fasting for at least 8 hours.   Calcium, Ion 1.15 1.15 - 1.40 mmol/L   TCO2 28 22 - 32 mmol/L   Hemoglobin 12.2 12.0 - 15.0 g/dL   HCT 76.2 83.1 - 51.7 %  I-stat chem 8, ed     Status: Abnormal   Collection Time: 05/31/23  8:09 AM  Result Value Ref Range   Sodium 140 135 - 145 mmol/L   Potassium 4.3 3.5 - 5.1 mmol/L   Chloride 103 98 - 111 mmol/L   BUN 8 8 - 23 mg/dL   Creatinine, Ser 6.16 0.44 - 1.00 mg/dL   Glucose, Bld 073 (H) 70 - 99 mg/dL    Comment: Glucose reference range applies only to samples taken after fasting for at least 8 hours.   Calcium, Ion 1.15 1.15 - 1.40 mmol/L  TCO2 28 22 - 32 mmol/L   Hemoglobin 12.2 12.0 - 15.0 g/dL   HCT 78.2 95.6 - 21.3 %  Resp panel by RT-PCR (RSV, Flu A&B, Covid) Anterior Nasal Swab     Status: None   Collection Time: 05/31/23  8:30 AM   Specimen: Anterior Nasal Swab  Result Value Ref Range   SARS Coronavirus 2 by RT PCR NEGATIVE NEGATIVE   Influenza A by PCR NEGATIVE NEGATIVE   Influenza B by PCR NEGATIVE NEGATIVE    Comment: (NOTE) The Xpert Xpress  SARS-CoV-2/FLU/RSV plus assay is intended as an aid in the diagnosis of influenza from Nasopharyngeal swab specimens and should not be used as a sole basis for treatment. Nasal washings and aspirates are unacceptable for Xpert Xpress SARS-CoV-2/FLU/RSV testing.  Fact Sheet for Patients: BloggerCourse.com  Fact Sheet for Healthcare Providers: SeriousBroker.it  This test is not yet approved or cleared by the [place] FDA and has been authorized for detection and/or diagnosis of SARS-CoV-2 by FDA under an Emergency Use Authorization (EUA). This EUA will remain in effect (meaning this test can be used) for the duration of the COVID-19 declaration under Section 564(b)(1) of the Act, 21 U.S.C. section 360bbb-3(b)(1), unless the authorization is terminated or revoked.     Resp Syncytial Virus by PCR NEGATIVE NEGATIVE    Comment: (NOTE) Fact Sheet for Patients: BloggerCourse.com  Fact Sheet for Healthcare Providers: SeriousBroker.it  This test is not yet approved or cleared by the [place] FDA and has been authorized for detection and/or diagnosis of SARS-CoV-2 by FDA under an Emergency Use Authorization (EUA). This EUA will remain in effect (meaning this test can be used) for the duration of the COVID-19 declaration under Section 564(b)(1) of the Act, 21 U.S.C. section 360bbb-3(b)(1), unless the authorization is terminated or revoked.  Performed at [facility], [address] [place], [place] [address]   Troponin I (High Sensitivity)     Status: Abnormal   Collection Time: 05/31/23  9:18 AM  Result Value Ref Range   Troponin I (High Sensitivity) 30 (H) <18 ng/L    Comment: (NOTE) Elevated high sensitivity troponin I (hsTnI) values and significant  changes across serial measurements may suggest ACS but many other  chronic and acute conditions are known to elevate  hsTnI results.  Refer to the "Links" section for chest pain algorithms and additional  guidance. Performed at [facility], [address] [place], [place] [address]   I-Stat CG4 Lactic Acid, ED     Status: Abnormal   Collection Time: 05/31/23  9:30 AM  Result Value Ref Range   Lactic Acid, Venous 2.7 (HH) 0.5 - 1.9 mmol/L   Comment NOTIFIED PHYSICIAN   D-dimer, quantitative     Status: Abnormal   Collection Time: 05/31/23  9:46 AM  Result Value Ref Range   D-Dimer, Quant 3.61 (H) 0.00 - 0.50 ug/mL-FEU    Comment: (NOTE) At the manufacturer cut-off value of 0.5 g/mL FEU, this assay has a negative predictive value of 95-100%.This assay is intended for use in conjunction with a clinical pretest probability (PTP) assessment model to exclude pulmonary embolism (PE) and deep venous thrombosis (DVT) in outpatients suspected of PE or DVT. Results should be correlated with clinical presentation. Performed at [facility], [address], [place], [place] [address]   Lactic acid, plasma     Status: None   Collection Time: 05/31/23  1:50 PM  Result Value Ref Range   Lactic  Acid, Venous 1.4 0.5 - 1.9 mmol/L    Comment: Performed at [facility], [address] [place], [place] [address]  I-Stat venous blood gas, ED     Status: Abnormal   Collection Time: 05/31/23  2:45 PM  Result Value Ref Range   pH, Ven 7.523 (H) 7.25 - 7.43   pCO2, Ven 41.1 (L) 44 - 60 mmHg   pO2, Ven 102 (H) 32 - 45 mmHg   Bicarbonate 33.8 (H) 20.0 - 28.0 mmol/L   TCO2 35 (H) 22 - 32 mmol/L   O2 Saturation 98 %   Acid-Base Excess 10.0 (H) 0.0 - 2.0 mmol/L   Sodium 137 135 - 145 mmol/L   Potassium 4.7 3.5 - 5.1 mmol/L   Calcium, Ion 1.00 (L) 1.15 - 1.40 mmol/L   HCT 34.0 (L) 36.0 - 46.0 %   Hemoglobin 11.6 (L) 12.0 - 15.0 g/dL   Sample type VENOUS   Heparin level (unfractionated)     Status: Abnormal   Collection Time: 05/31/23  5:07 PM  Result Value Ref Range   Heparin Unfractionated  0.16 (L) 0.30 - 0.70 IU/mL    Comment: (NOTE) The clinical reportable range upper limit is being lowered to >1.10 to align with the FDA approved guidance for the current laboratory assay.  If heparin results are below expected values, and patient dosage has  been confirmed, suggest follow up testing of antithrombin III levels. Performed at [facility], [address], [place], [place] [address]   APTT     Status: None   Collection Time: 05/31/23  5:07 PM  Result Value Ref Range   aPTT 32 24 - 36 seconds    Comment: Performed at [facility], [address], [place], [place] [address]   DG Chest Portable 1 View  Result Date: 05/31/2023 CLINICAL DATA:  Shortness of breath. EXAM: PORTABLE CHEST 1 VIEW COMPARISON:  02/21/2023. FINDINGS: There is congestive heart failure/pulmonary edema characterized by increased interstitial markings with hilar and basilar predominance and bilateral small-to-moderate layering effusions. No pneumothorax. Stable moderately enlarged cardio-mediastinal silhouette. No acute osseous abnormalities. The soft tissues are within normal limits. IMPRESSION: *Congestive heart failure/pulmonary edema with bilateral small-to-moderate layering effusions. Electronically Signed   By: Jules Schick M.D.   On: 05/31/2023 08:08    Pending Labs Unresulted Labs (From admission, onward)     Start     Ordered   06/01/23 0500  Heparin level (unfractionated)  Daily,   R     Placed in "And" Linked Group   05/31/23 0914   06/01/23 0500  CBC  Daily,   R     Placed in "And" Linked Group   05/31/23 0914   06/01/23 0500  Basic metabolic panel  Tomorrow morning,   R        05/31/23 1015   06/01/23 0100  Heparin level (unfractionated)  Once-Timed,   TIMED        05/31/23 1824   05/31/23 1417  Blood gas, venous  ONCE - STAT,   STAT        05/31/23 1416   05/31/23 1139  Lactic acid, plasma  (Lactic Acid)  STAT Now then every 3 hours,   R (with STAT occurrences)       05/31/23 1138            Vitals/Pain Today's Vitals   05/31/23 1700 05/31/23 1701 05/31/23 1715 05/31/23 1800  BP: 112/75   (!) 121/97  Pulse: Marland Kitchen)  56 (!) 59 63 (!) 112  Resp: 18 19 19 19   Temp:    98.7 F (37.1 C)  TempSrc:      SpO2: 92% 92% 91% 95%  Weight:      Height:      PainSc:        Isolation Precautions No active isolations  Medications Medications  heparin ADULT infusion 100 units/mL (25000 units/236mL) (1,250 Units/hr Intravenous Rate/Dose Change 05/31/23 1827)  atorvastatin (LIPITOR) tablet 80 mg (80 mg Oral Given 05/31/23 1706)  losartan (COZAAR) tablet 25 mg (25 mg Oral Given 05/31/23 1707)  ARIPiprazole (ABILIFY) tablet 5 mg (5 mg Oral Patient Refused/Not Given 05/31/23 1726)  DULoxetine (CYMBALTA) DR capsule 30 mg (30 mg Oral Given 05/31/23 1706)  dapagliflozin propanediol (FARXIGA) tablet 10 mg (10 mg Oral Patient Refused/Not Given 05/31/23 1725)  pantoprazole (PROTONIX) EC tablet 40 mg (40 mg Oral Given 05/31/23 1707)  melatonin tablet 3 mg (has no administration in time range)  gabapentin (NEURONTIN) capsule 600 mg (600 mg Oral Given 05/31/23 1659)  diclofenac Sodium (VOLTAREN) 1 % topical gel 2-4 g (has no administration in time range)  acetaminophen (TYLENOL) tablet 650 mg (has no administration in time range)    Or  acetaminophen (TYLENOL) suppository 650 mg (has no administration in time range)  senna-docusate (Senokot-S) tablet 1 tablet (has no administration in time range)  ipratropium-albuterol (DUONEB) 0.5-2.5 (3) MG/3ML nebulizer solution 3 mL (3 mLs Nebulization Given 05/31/23 1702)  nicotine (NICODERM CQ - dosed in mg/24 hours) patch 14 mg (14 mg Transdermal Patch Applied 05/31/23 1707)  metoprolol tartrate (LOPRESSOR) injection 5 mg (has no administration in time range)  metoprolol succinate (TOPROL-XL) 24 hr tablet 100 mg (100 mg Oral Given 05/31/23 1656)  furosemide (LASIX) injection 40 mg (40 mg Intravenous Given 05/31/23 1719)   buprenorphine-naloxone (SUBOXONE) 8-2 mg per SL tablet 1 tablet (1 tablet Sublingual Patient Refused/Not Given 05/31/23 1725)  furosemide (LASIX) injection 60 mg (60 mg Intravenous Given 05/31/23 0835)  metoprolol tartrate (LOPRESSOR) injection 5 mg (5 mg Intravenous Given 05/31/23 0914)  heparin bolus via infusion 4,000 Units (4,000 Units Intravenous Bolus from Bag 05/31/23 0943)  iohexol (OMNIPAQUE) 350 MG/ML injection 75 mL (75 mLs Intravenous Contrast Given 05/31/23 1531)    Mobility walks with person assist     Focused Assessments Cardiac Assessment Handoff:  Cardiac Rhythm: Sinus tachycardia Lab Results  Component Value Date   TROPONINI <0.03 06/27/2017   Lab Results  Component Value Date   DDIMER 3.61 (H) 05/31/2023   Does the Patient currently have chest pain? No    R Recommendations: See Admitting Provider Note  Report given to: [phone]

## 2023-06-01 ENCOUNTER — Inpatient Hospital Stay (HOSPITAL_COMMUNITY): Payer: 59

## 2023-06-01 DIAGNOSIS — I5023 Acute on chronic systolic (congestive) heart failure: Secondary | ICD-10-CM

## 2023-06-01 DIAGNOSIS — I4891 Unspecified atrial fibrillation: Secondary | ICD-10-CM | POA: Diagnosis not present

## 2023-06-01 LAB — ECHOCARDIOGRAM COMPLETE
AR max vel: 2.07 cm2
AV Area VTI: 2.18 cm2
AV Area mean vel: 2.01 cm2
AV Mean grad: 6.2 mm[Hg]
AV Peak grad: 11.3 mm[Hg]
AV Vena cont: 0.7 cm
Ao pk vel: 1.68 m/s
Area-P 1/2: 3.99 cm2
Height: 65 in
S' Lateral: 4.8 cm
Weight: 3418.01 [oz_av]

## 2023-06-01 LAB — BASIC METABOLIC PANEL
Anion gap: 12 (ref 5–15)
BUN: 14 mg/dL (ref 8–23)
CO2: 29 mmol/L (ref 22–32)
Calcium: 9.3 mg/dL (ref 8.9–10.3)
Chloride: 95 mmol/L — ABNORMAL LOW (ref 98–111)
Creatinine, Ser: 0.86 mg/dL (ref 0.44–1.00)
GFR, Estimated: >60 mL/min (ref >60)
Glucose, Bld: 141 mg/dL — ABNORMAL HIGH (ref 70–99)
Potassium: 4.4 mmol/L (ref 3.5–5.1)
Sodium: 136 mmol/L (ref 135–145)

## 2023-06-01 LAB — LACTIC ACID, PLASMA: Lactic Acid, Venous: 1.1 mmol/L (ref 0.5–1.9)

## 2023-06-01 LAB — HEPARIN LEVEL (UNFRACTIONATED): Heparin Unfractionated: <0.1 [IU]/mL — ABNORMAL LOW (ref 0.30–0.70)

## 2023-06-01 MED ORDER — GUAIFENESIN ER 600 MG PO TB12
1200.0000 mg | ORAL_TABLET | Freq: Two times a day (BID) | ORAL | Status: DC
  Administered 2023-06-01 – 2023-06-09 (×17): 1200 mg via ORAL
  Filled 2023-06-01 (×17): qty 2

## 2023-06-01 MED ORDER — METOPROLOL SUCCINATE ER 100 MG PO TB24
200.0000 mg | ORAL_TABLET | Freq: Every day | ORAL | Status: DC
  Administered 2023-06-02: 200 mg via ORAL
  Filled 2023-06-01: qty 2

## 2023-06-01 MED ORDER — UMECLIDINIUM BROMIDE 62.5 MCG/ACT IN AEPB
1.0000 | INHALATION_SPRAY | Freq: Every day | RESPIRATORY_TRACT | Status: DC
  Administered 2023-06-02 – 2023-06-09 (×8): 1 via RESPIRATORY_TRACT
  Filled 2023-06-01 (×2): qty 7

## 2023-06-01 MED ORDER — FLUTICASONE FUROATE-VILANTEROL 200-25 MCG/ACT IN AEPB
1.0000 | INHALATION_SPRAY | Freq: Every day | RESPIRATORY_TRACT | Status: DC
  Administered 2023-06-02 – 2023-06-09 (×8): 1 via RESPIRATORY_TRACT
  Filled 2023-06-01: qty 28

## 2023-06-01 MED ORDER — METOPROLOL SUCCINATE ER 100 MG PO TB24
100.0000 mg | ORAL_TABLET | Freq: Once | ORAL | Status: AC
  Administered 2023-06-01: 100 mg via ORAL
  Filled 2023-06-01: qty 1

## 2023-06-01 MED ORDER — REVEFENACIN 175 MCG/3ML IN SOLN
175.0000 ug | Freq: Every day | RESPIRATORY_TRACT | Status: DC
  Filled 2023-06-01: qty 3

## 2023-06-01 NOTE — Evaluation (Signed)
Physical Therapy Evaluation Patient Details Name: Autumn [name] MRN: [id number] DOB: September 16, 1955 Today's Date: 06/01/2023  History of Present Illness  Pt is a 67 year old female admitted 10/21  presenting to the ED with worsening shortness of breath and found to be in CHF with episodes of VTACH.  PMHx  HFrEF (EF 35%), A fib with RVR, chronic hypoxic respiratory failure, T2DM, polysubstance use  Clinical Impression  Pt admitted with above diagnosis. Pt tolerated bed to chair transfer well.  Pt able to stand to feet without more than CGA.  Pt on 4LO2 and VSS.  Pt should progress well.  Pt currently with functional limitations due to the deficits listed below (see PT Problem List). Pt will benefit from acute skilled PT to increase their independence and safety with mobility to allow discharge.           If plan is discharge home, recommend the following: A little help with walking and/or transfers;Assistance with cooking/housework;Assist for transportation;Help with stairs or ramp for entrance   Can travel by private vehicle        Equipment Recommendations None recommended by PT  Recommendations for Other Services       Functional Status Assessment Patient has had a recent decline in their functional status and demonstrates the ability to make significant improvements in function in a reasonable and predictable amount of time.     Precautions / Restrictions Precautions Precautions: Fall Restrictions Weight Bearing Restrictions: No      Mobility  Bed Mobility Overal bed mobility: Needs Assistance Bed Mobility: Supine to Sit     Supine to sit: Contact guard     General bed mobility comments: No physical assist needed    Transfers Overall transfer level: Needs assistance Equipment used: 1 person hand held assist Transfers: Sit to/from Stand, Bed to chair/wheelchair/BSC Sit to Stand: Contact guard assist   Step pivot transfers: Contact guard assist       General  transfer comment: Pt was able to stand to feet with CGA and step and pivot to chair with 1 UE support.  Pt declined walking but was able to get to chair and VSS.    Ambulation/Gait                  Stairs            Wheelchair Mobility     Tilt Bed    Modified Rankin (Stroke Patients Only)       Balance                                             Pertinent Vitals/Pain Pain Assessment Pain Assessment: No/denies pain    Home Living Family/patient expects to be discharged to:: Private residence Living Arrangements: Other relatives (grandson who works) Available Help at Discharge: Family;Available PRN/intermittently (girlfriend is there alot and her family) Type of Home: House Home Access: Stairs to enter Entrance Stairs-Rails: Can reach both Entrance Stairs-Number of Steps: 5 Alternate Level Stairs-Number of Steps: 6 Home Layout: Two level Home Equipment: Rollator (4 wheels);Cane - single point;BSC/3in1;Shower seat;Other (comment) (home O2 3L)      Prior Function Prior Level of Function : Independent/Modified Independent             Mobility Comments: ambulatory with SPC typically, PRN rollator ADLs Comments: gets "flabergasted" with ADL; family does IADL tasks; Family takes  her to appointments     Extremity/Trunk Assessment   Upper Extremity Assessment Upper Extremity Assessment: Defer to OT evaluation    Lower Extremity Assessment Lower Extremity Assessment: Generalized weakness    Cervical / Trunk Assessment Cervical / Trunk Assessment: Normal  Communication   Communication Communication: No apparent difficulties  Cognition Arousal: Alert Behavior During Therapy: WFL for tasks assessed/performed Overall Cognitive Status: Within Functional Limits for tasks assessed                                          General Comments General comments (skin integrity, edema, etc.): 105 bpm, 96% 4LO2, 116/64     Exercises General Exercises - Lower Extremity Ankle Circles/Pumps: AROM, Both, 10 reps, Supine Long Arc Quad: AROM, Both, 10 reps, Seated Hip Flexion/Marching: AROM, Both, 10 reps, Seated   Assessment/Plan    PT Assessment Patient needs continued PT services  PT Problem List Decreased activity tolerance;Decreased balance;Decreased knowledge of use of DME;Decreased safety awareness;Decreased knowledge of precautions;Cardiopulmonary status limiting activity       PT Treatment Interventions DME instruction;Gait training;Functional mobility training;Therapeutic activities;Therapeutic exercise;Stair training;Balance training;Patient/family education    PT Goals (Current goals can be found in the Care Plan section)  Acute Rehab PT Goals Patient Stated Goal: to go home PT Goal Formulation: With patient Time For Goal Achievement: 06/15/23 Potential to Achieve Goals: Good    Frequency Min 1X/week     Co-evaluation               AM-PAC PT "6 Clicks" Mobility  Outcome Measure Help needed turning from your back to your side while in a flat bed without using bedrails?: None Help needed moving from lying on your back to sitting on the side of a flat bed without using bedrails?: A Little Help needed moving to and from a bed to a chair (including a wheelchair)?: A Little Help needed standing up from a chair using your arms (e.g., wheelchair or bedside chair)?: A Little Help needed to walk in hospital room?: Total Help needed climbing 3-5 steps with a railing? : Total 6 Click Score: 15    End of Session Equipment Utilized During Treatment: Gait belt;Oxygen Activity Tolerance: Patient limited by fatigue Patient left: in chair;with call bell/phone within reach;with chair alarm set Nurse Communication: Mobility status PT Visit Diagnosis: Muscle weakness (generalized) (M62.81)    Time: 6045-4098 PT Time Calculation (min) (ACUTE ONLY): 15 min   Charges:   PT Evaluation $PT Eval  Moderate Complexity: 1 Mod   PT General Charges $$ ACUTE PT VISIT: 1 Visit         Razia Screws M,PT Acute Rehab Services [phone]   Bevelyn Buckles 06/01/2023, 10:33 AM

## 2023-06-01 NOTE — Evaluation (Signed)
Occupational Therapy Evaluation Patient Details Name: Autumn [name] MRN: [id number] DOB: 1956-06-04 Today's Date: 06/01/2023   History of Present Illness Pt is a 67 year old female admitted 10/21  presenting to the ED with worsening shortness of breath and found to be in CHF with episodes of VTACH.  PMHx  HFrEF (EF 35%), A fib with RVR, chronic hypoxic respiratory failure, T2DM, polysubstance use   Clinical Impression   Pt currently at close supervision level for selfcare tasks sit to stand and for toileting tasks without an assistive device to the [facility].  Increased HR and increased SOB with limited endurance noted.  HR elevating up to 108 BPM with oxygen sats maintaining 94% or better on 4Ls.  Dyspnea 3/4 noted with activity.  Pt reports living with her grandson and family with PRN assist as needed.  She reports being modified independent PTA with use of her single point cane in the house and rollator outside.  Feel she will benefit from acute care OT at this time to help increase independence, balance, endurance, and safety for return home.  Do not anticipate any post acute OT needs at this time.         If plan is discharge home, recommend the following: Assist for transportation;A little help with bathing/dressing/bathroom    Functional Status Assessment  Patient has had a recent decline in their functional status and demonstrates the ability to make significant improvements in function in a reasonable and predictable amount of time.  Equipment Recommendations  None recommended by OT       Precautions / Restrictions Precautions Precautions: Fall Restrictions Weight Bearing Restrictions: No      Mobility Bed Mobility Overal bed mobility: Needs Assistance Bed Mobility: Supine to Sit     Supine to sit: Supervision, HOB elevated          Transfers Overall transfer level: Needs assistance Equipment used: None Transfers: Sit to/from Stand, Bed to chair/wheelchair/BSC Sit to  Stand: Supervision     Step pivot transfers: Supervision            Balance Overall balance assessment: Needs assistance Sitting-balance support: Feet supported Sitting balance-Leahy Scale: Good     Standing balance support: Single extremity supported, During functional activity Standing balance-Leahy Scale: Fair                             ADL either performed or assessed with clinical judgement   ADL Overall ADL's : Needs assistance/impaired Eating/Feeding: Independent;Sitting   Grooming: Wash/dry hands;Wash/dry face;Sitting;Set up   Upper Body Bathing: Set up;Sitting   Lower Body Bathing: Supervison/ safety;Sitting/lateral leans   Upper Body Dressing : Sitting;Set up   Lower Body Dressing: Supervision/safety;Sit to/from stand   Toilet Transfer: Supervision/safety;BSC/3in1;Stand-pivot   Toileting- Architect and Hygiene: Supervision/safety;Sit to/from stand       Functional mobility during ADLs: Supervision/safety (stand pivot transfer to the [facility]) General ADL Comments: Pt's HR in the upper 90s to 107 BPM during toilet transfer and bed to recliner transfer with close supervision.  Increased SOB noted with transfers and for LB dressing tasks, when bringing her LEs up over the opposite knee to donn her gripper sock.  Oxygen sats at 94% or better on 4 Ls nasal cannula with activity.     Vision Baseline Vision/History: 0 No visual deficits Ability to See in Adequate Light: 0 Adequate Patient Visual Report: No change from baseline Vision Assessment?: Wears glasses for reading  Perception Perception: Within Functional Limits       Praxis Praxis: WFL       Pertinent Vitals/Pain Pain Assessment Pain Assessment: No/denies pain     Extremity/Trunk Assessment Upper Extremity Assessment Upper Extremity Assessment: Overall WFL for tasks assessed   Lower Extremity Assessment Lower Extremity Assessment: Defer to PT evaluation   Cervical  / Trunk Assessment Cervical / Trunk Assessment: Normal   Communication Communication Communication: No apparent difficulties   Cognition Arousal: Alert Behavior During Therapy: WFL for tasks assessed/performed Overall Cognitive Status: Within Functional Limits for tasks assessed                                                  Home Living Family/patient expects to be discharged to:: Private residence Living Arrangements: Other relatives (grandson who works) Available Help at Discharge: Family;Available PRN/intermittently (lives with grandson and his family) Type of Home: House Home Access: Stairs to enter Secretary/administrator of Steps: 5 Entrance Stairs-Rails: Can reach both Home Layout: Two level (no bathroom on the first floor) Alternate Level Stairs-Number of Steps: 6 Alternate Level Stairs-Rails: Can reach both Bathroom Shower/Tub: Tub/shower unit;Curtain   Bathroom Toilet: Handicapped height Bathroom Accessibility: Yes   Home Equipment: Rollator (4 wheels);Cane - single point;BSC/3in1;Shower seat;Other (comment) (home O2 3L)   Additional Comments: On 4Ls nasal cannula at home      Prior Functioning/Environment Prior Level of Function : Independent/Modified Independent             Mobility Comments: ambulatory with SPC typically, PRN rollator but not a lot of room in the house for it.  She typically uses it out in the community ADLs Comments: she does her ADLs but family does IADL tasks; Family takes her to appointments        OT Problem List: Decreased activity tolerance;Cardiopulmonary status limiting activity;Impaired balance (sitting and/or standing);Decreased knowledge of use of DME or AE      OT Treatment/Interventions: Self-care/ADL training;Therapeutic activities;Balance training;Patient/family education;Energy conservation    OT Goals(Current goals can be found in the care plan section) Acute Rehab OT Goals Patient Stated Goal:  Pt did not state but agreeable to participation in OT session. OT Goal Formulation: With patient Time For Goal Achievement: 06/15/23 Potential to Achieve Goals: Good  OT Frequency: Min 1X/week       AM-PAC OT "6 Clicks" Daily Activity     Outcome Measure Help from another person eating meals?: None Help from another person taking care of personal grooming?: A Little Help from another person toileting, which includes using toliet, bedpan, or urinal?: A Little Help from another person bathing (including washing, rinsing, drying)?: A Little Help from another person to put on and taking off regular upper body clothing?: None Help from another person to put on and taking off regular lower body clothing?: A Little 6 Click Score: 20   End of Session Equipment Utilized During Treatment: Oxygen Nurse Communication: Mobility status  Activity Tolerance: Patient limited by fatigue Patient left: in chair;with call bell/phone within reach;with chair alarm set  OT Visit Diagnosis: Unsteadiness on feet (R26.81);Muscle weakness (generalized) (M62.81)                Time: 4098-1191 OT Time Calculation (min): 32 min Charges:  OT General Charges $OT Visit: 1 Visit OT Evaluation $OT Eval Moderate Complexity: 1 Mod OT Treatments $Self  Care/Home Management : 8-22 mins Perrin Maltese, OTR/L Acute Rehabilitation Services  Office [phone] 06/01/2023

## 2023-06-01 NOTE — Progress Notes (Signed)
*  PRELIMINARY RESULTS* Echocardiogram 2D Echocardiogram has been performed.  Autumn [name] 06/01/2023, 11:56 AM

## 2023-06-01 NOTE — Plan of Care (Signed)

## 2023-06-01 NOTE — Progress Notes (Signed)
   06/01/23 2005  BiPAP/CPAP/SIPAP  Reason BIPAP/CPAP not in use Non-compliant (pt refusing Bipap at the time. States she has been asleep all day RT encouraged 2x.)

## 2023-06-01 NOTE — TOC Initial Note (Signed)
Transition of Care [facility]) - Initial/Assessment Note    Patient Details  Name: Autumn [name] MRN: [id number] Date of Birth: Jan 14, 1956  Transition of Care [facility]) CM/SW Contact:    Lawerance Sabal, RN Phone Number: 06/01/2023, 2:38 PM  Clinical Narrative:                  Spoke w patient at bedside she states tat she has home oxygen with [employer] at 4L.  She states that family will provide transportation home.   Primary care Corrington, Kip A, MD Coverage [employer] Dual    Patient Goals and CMS Choice Patient states their goals for this hospitalization and ongoing recovery are:: return home          Expected Discharge Plan and Services   Discharge Planning Services: CM Consult   Living arrangements for the past 2 months: Single Family Home                                      Prior Living Arrangements/Services Living arrangements for the past 2 months: Single Family Home Lives with:: Relatives (grandchildren)                   Activities of Daily Living   ADL Screening (condition at time of admission) Independently performs ADLs?: Yes (appropriate for developmental age) Is the patient deaf or have difficulty hearing?: No Does the patient have difficulty seeing, even when wearing glasses/contacts?: No Does the patient have difficulty concentrating, remembering, or making decisions?: No  Permission Sought/Granted                  Emotional Assessment              Admission diagnosis:  Paroxysmal atrial fibrillation (HCC) [I48.0] Acute exacerbation of CHF (congestive heart failure) (HCC) [I50.9] Acute respiratory failure with hypoxia (HCC) [J96.01] Acute on chronic congestive heart failure, unspecified heart failure type (HCC) [I50.9] Patient Active Problem List   Diagnosis Date Noted   Acute exacerbation of CHF (congestive heart failure) (HCC) 05/31/2023   Acute on chronic congestive heart failure (HCC) 02/22/2023   Paroxysmal atrial fibrillation  (HCC) 02/22/2023   Acute hypoxic respiratory failure (HCC) 02/17/2023   Acute on chronic systolic heart failure (HCC) 01/18/2023   COPD (chronic obstructive pulmonary disease) (HCC) 12/03/2022   Parkinson's disease (HCC) 12/03/2022   PAD (peripheral artery disease) (HCC) 12/03/2022   CAD (coronary artery disease) S/P stent to LAD 12/03/2022   Atrial fibrillation with rapid ventricular response (HCC) 01/11/2021   Tobacco dependence 01/11/2021   Homelessness 01/11/2021   Alcohol dependence with uncomplicated withdrawal (HCC) 06/27/2017   Polysubstance abuse (HCC) 06/27/2017   Depression 06/27/2017   Essential hypertension 06/27/2017   GERD (gastroesophageal reflux disease) 06/27/2017   Attention deficit disorder (ADD)    PCP:  Vivien Presto, MD Pharmacy:   [facility] [address] [place] [address] Phone: [phone] Fax: [phone]  [facility] [address] [place] [address] Phone: [phone] Fax: [phone]  [id number] [place], [place] - [address] AT [facility] OF [address] & [facility] [address]  [place] [address] Phone: [phone] Fax: [phone]  [id number] [place],  - [address] AT [facility] OF [facility] [address] & [facility] [address] [place] [address] Phone: [phone]  Fax: [phone]     Social Determinants of Health (SDOH) Social History: SDOH Screenings   Food Insecurity: Patient Declined (05/31/2023)  Housing: Patient Declined (05/31/2023)  Transportation Needs: Patient Declined (05/31/2023)  Recent Concern: Transportation Needs - Unmet Transportation Needs (05/17/2023)   Received from [facility]  Utilities: Patient Declined (05/31/2023)  Alcohol Screen: Low Risk  (01/21/2023)  Financial Resource Strain: Low Risk  (05/17/2023)   Received from [facility]  Physical Activity: Unknown (05/17/2023)    Received from [facility]  Social Connections: Socially Integrated (05/17/2023)   Received from [facility]  Stress: No Stress Concern Present (05/17/2023)   Received from [facility]  Tobacco Use: High Risk (05/31/2023)   SDOH Interventions:     Readmission Risk Interventions    01/21/2023    1:29 PM 12/07/2022    1:27 PM  Readmission Risk Prevention Plan  Transportation Screening Complete Complete  PCP or Specialist Appt within 3-5 Days  Complete  HRI or Home Care Consult  Complete  Social Work Consult for Recovery Care Planning/Counseling  Complete  Palliative Care Screening  Not Applicable  Medication Review Oceanographer) Complete Complete  PCP or Specialist appointment within 3-5 days of discharge Complete   HRI or Home Care Consult Complete   SW Recovery Care/Counseling Consult Complete   Palliative Care Screening Not Applicable   Skilled Nursing Facility Not Applicable

## 2023-06-01 NOTE — Hospital Course (Addendum)
  Principal Problem:   Acute exacerbation of CHF (congestive heart failure) (HCC) Active Problems:   Polysubstance abuse (HCC)   Depression   Atrial fibrillation with rapid ventricular response (HCC)   COPD (chronic obstructive pulmonary disease) (HCC)   Parkinson's disease (HCC)   CAD (coronary artery disease) S/P stent to LAD   Acute hypoxic respiratory failure (HCC)  Resolved Problems:   * No resolved hospital problems. *  Consults:***  Procedures:***  Follow-up items: -hh pt -asa -breztri copay 0   The patient is a 67 year old female with pertinent history of HFrEF (last EF 35%), A-fib with RVR, chronic hypoxic respiratory failure on home oxygen, COPD, type 2 diabetes, polysubstance abuse who was admitted for hypoxic respiratory failure and A-fib with RVR.  She was diuresed with IV Lasix 80 mg twice daily with good output.  Her hospital stay was complicated by hypotension and concern for peripheral artery disease.  After resuming her home metoprolol dose, the patient's blood pressures were low and she has some mottling of the skin of the lower extremities.  She has had cold feet and hands throughout her hospital stay here concerning for peripheral artery disease.  ABIs were obtained which showed***.  She did well with diuresis and is euvolemic on exam with a standing weight of***.  She was encouraged to follow-up with the heart failure team as well as to establish with the [facility] for PCP purposes.  Autumn [name] is a 67 year old female w/ PMHx of HFrEF (EF 35%), Afib with RVR, chronic hypoxic respiratory failure, COPD, T2DM, polysubstance use who is admitted for acute on chronic heart failure, acute hypoxic respiratory failure, and afib RVR.   #Acute on Chronic Heart Failure with Reduced Ejection Fraction (LVEF 30-35%) Admitted at 116 kg, discharged under 99 kg today. Dry weight: 96.9 kg at last discharge. She was diuresed with IV Lasix 80 mg BID for a week. GDMT was stepped up as  tolerated; Metoprolol 100 mg may be increased to home dose of 200 mg on follow up. Losartan 25 mg, Farxiga 10 mg, Consider spironolactone or eplerenone if she is normotensive on follow up.  #Chronic Obstructive Pulmonary Disease Admitted in respiratory failure, wheezing on examination. Treated with prednisone and nebulizers. Lungs clear on discharge, no O2 requirement with walking. She is [facility] [place]. PFT's are up to date and on (06/22/2022) showed mild obstruction. On Breztri 2 puffs twice a day, Albuterol prn. Continue outpatient triple therapy.  #Longstanding Persistent Atrial fibrillation with RVR Admitted w/ RVR and rate controlled with metoprolol 100 mg. She was discharged in Afib. Continue Rivaroxaban 20 mg.  #Peripheral Vascular Disease #CAD Vascular ABI results: ***. Added aspirin in outpatient setting.  Parkinson's: Diagnosed ~08/21/2022; [facility] but does not have follow up. Her Sinemet was withheld during admission as she was not taking it and was hypotensive with poor perfusion for a significant portion of hospitalization. She will need to restart this medication. MDD: Abilify 5 mg and duloxetine 30 mg HLD: Atorvastatin 80 mg. LDL was 109 on 11/2022 Arthritis/Chronic pain: Home tylenol, voltaren, gabapentin 600 mg TID OUD: Continue home Suboxone 8-2 mg 3 times daily Smoking: Nicoderm 14 mg daily, encourage cessation

## 2023-06-01 NOTE — Progress Notes (Signed)
7 beats of V Tach reported by Delton See, MD aware

## 2023-06-01 NOTE — Plan of Care (Signed)

## 2023-06-01 NOTE — Progress Notes (Addendum)
Subjective:  Autumn [name] states that her shortness of breath has improved since admission yesterday, however she does not feel back to baseline. The patient also complains of having leg swelling still. She states that she fell behind on taking her medications, but does not have problems with access or affording her medications. However, the patient would prefer a pharmacy and physician that is closer to [facility] as her drive to [facility] can be too much for her. She is open to having follow up with the IM clinic here.  Objective:  Vital signs in last 24 hours: Vitals:   06/01/23 0424 06/01/23 0425 06/01/23 0426 06/01/23 0427  BP:    113/87  Pulse: 76 (!) 34 (!) 103 (!) 101  Resp:    19  Temp:    98 F (36.7 C)  TempSrc:    Oral  SpO2: 99% 99% 99% 99%  Weight:      Height:       Weight change: 0 kg  Intake/Output Summary (Last 24 hours) at 06/01/2023 0751 Last data filed at 05/31/2023 2230 Gross per 24 hour  Intake 240 ml  Output --  Net 240 ml   Physical Exam Constitutional:      General: She is not in acute distress.    Appearance: She is obese.     Comments: On 4L oxygen by nasal cannula. Sitting upright at bedside.  HENT:     Nose: Nose normal.  Cardiovascular:     Rate and Rhythm: Tachycardia present. Rhythm irregular.  Pulmonary:     Breath sounds: Wheezing and rales present.  Abdominal:     Tenderness: There is no abdominal tenderness.     Comments: See skin  Musculoskeletal:     Right lower leg: Edema present.     Left lower leg: Edema present.  Skin:    Comments: Well healed old, linear scar to the left flank  Neurological:     General: No focal deficit present.     Mental Status: She is alert and oriented to person, place, and time. Mental status is at baseline.  Psychiatric:        Mood and Affect: Mood normal.        Behavior: Behavior normal.     Imaging: --DG Chest Portable 1 View   Result Date: 05/31/2023 CLINICAL DATA:  Shortness  of breath. EXAM: PORTABLE CHEST 1 VIEW COMPARISON:  02/21/2023. FINDINGS: There is congestive heart failure/pulmonary edema characterized by increased interstitial markings with hilar and basilar predominance and bilateral small-to-moderate layering effusions. No pneumothorax. Stable moderately enlarged cardio-mediastinal silhouette. No acute osseous abnormalities. The soft tissues are within normal limits. IMPRESSION: *Congestive heart failure/pulmonary edema with bilateral small-to-moderate layering effusions. Electronically Signed   By: Jules Schick M.D.   On: 05/31/2023 08:08     --CT ANGIOGRAPHY CHEST WITH CONTRAST   FINDINGS: Cardiovascular: Atherosclerotic calcifications of the thoracic aorta are noted. No aneurysmal dilatation is seen. Coronary calcifications are noted. The heart is enlarged in size. The pulmonary artery shows a normal branching pattern bilaterally. No filling defect to suggest pulmonary embolism is noted. Mediastinum/Nodes: Esophagus is within normal limits as visualized. No hilar or mediastinal adenopathy is noted. Scattered small likely reactive lymph nodes are noted in the mediastinum. The thoracic inlet is within normal limits. Lungs/Pleura: Bibasilar consolidation is seen. No sizable effusion is noted. Previously seen vascular congestion and edema has resolved in the interval. No other focal abnormality is noted. Upper Abdomen: Scattered hypodensities are noted within the  liver consistent with cysts stable from the prior exam. No other focal abnormality is noted. Musculoskeletal: Degenerative changes of the thoracic spine are noted. No acute bony abnormality is noted. Review of the MIP images confirms the above findings.  IMPRESSION: No evidence of pulmonary emboli. Bibasilar consolidation. Changes of CHF seen on the prior chest x-ray have resolved. No sizable effusions are seen.  Electronically Signed   By: Alcide Clever M.D.   On: 05/31/2023  18:47  Assessment/Plan:  Principal Problem:   Acute exacerbation of CHF (congestive heart failure) (HCC)  Autumn [name] is a 67 year old female with past medical hx of PMHx of HFrEF (EF 35%), A fib with RVR, chronic hypoxic respiratory failure, COPD, T2DM, polysubstance use who is admitted for acute on chronic heart failure, acute hypoxic respiratory failure, and afib RVR.   #Acute on Chronic Heart Failure with Reduced Ejection Fraction (LVEF 35%) Admitted with Xray, BNP, and clinical findings consistent with HF exacerbation. Prior echo LVEF 35%, NYHA IV. Dry weight: 96.9 kg at last discharge 02/22/2023. Home diuretics: Lasix 80 mg once daily. 2L O2 at baseline, on 4L at this time. CXR (8 am) showed Congestive heart failure/pulmonary edema with bilateral small-to-moderate layering effusions, but follow up CT A Chest showed resolution (6 pm). Trigger for decompensation is most likely medication non-compliance, but also considered CAD vs. myocardial ischemia, uncontrolled HTN, valvular disease (MR, AR), infection, thyrotoxicosis, anemia, PE, emotional stress, drugs, dilated cardiomyopathy. PE was considered more given hypoxia, tachycardia, D-dimer elevation, but CT Chest was negative for this. Echo is pending and we will hold off on digoxin until thrombus is ruled out. Will obtain ferritin and transferrin with plans to replace with iron sucrose 200 x 5 days if ferritin <100 OR <300 with transferrin sat <20% per FAIR-HF trial (The [place] Journal of Medicine. 2009. 361(25):2436-48.) - IV Lasix 40 mg BID for goal net negative 1L/day - Strict I/Os, daily weights - GDMT:    *BetaB: Increase Toprol XL 100 to 200 mg due to AFib    *RAAS: Continue home losartan 25 mg    *Aldo/Neurohormonal: N/a    *SGLT: Continue home Farxiga 10 mg    *ICD: Echo pending. Address outpatient (EF 35%) - Iron: Iron sucrose 200 x 5 days if ferritin <100 OR <300 with transferrin sat <20%   #Longstanding Persistent Atrial  fibrillation with RVR Was on rivaroxaban 20 mg once daily. She was not anticoagulated for one week. HR around 115 today. Added 100 mg Toprol XL to morning 100 mg that was already given. Will start 200 mg tablet tomorrow. Heparin stopped yesterday. Echo is pending, revisit digoxin therapy after thrombus is ruled out.  - Rivaroxaban 20 mg - Metoprolol 100 increased to 200 today. - Echo pending  #Chronic Obstructive Pulmonary Disease S/p bipap. Followed by [facility] in [place]. On Breztri 2 puffs twice a day, Albuterol. PFT's are up to date and on (06/22/2022) showed mild obstruction. Biphasic wheezing on examination today, could be from HF exacerbation, but could also be from her chronic COPD. Will add LAMA and consider steroids if her symptoms persist out of proportion to diuresis. Spo2 stable at this time on 4L of O2 (baseline 2L). O2 saturation goal of 88% or greater. - Duoneb q6h scheduled - Added Revefenacin Mikael Spray) nebulized 175 mcg once daily - Incentive spirometry  Chronic Problems T2DM: A1c was 6.1% on 10/2022. Appears to be diet controlled, now on Farxiga. MDD: continue abilify 5 mg and  duloxetine 30 mg HLD: continue atorvastatin 80 mg. LDL was 109 on 11/2022 Arthritis/Chronic pain: Home tylenol, voltaren, gabapentin 600 mg TID Parkinson's: Home Carbidopa-levodopa. Diagnosed ~08/21/2022; [facility] OUD: Continue home Suboxone 8-2 mg 3 times daily Smoking: Nicoderm 14 mg daily, encourage cessation  Diet: Heart Healthy IVF: None,None VTE: Heparin Code: Full PT/OT recs: Home Health, none.   LOS: 1 day   Kayleen Memos, Medical Student 06/01/2023, 7:51 AM

## 2023-06-02 ENCOUNTER — Encounter (HOSPITAL_COMMUNITY): Payer: Self-pay | Admitting: Internal Medicine

## 2023-06-02 DIAGNOSIS — I5023 Acute on chronic systolic (congestive) heart failure: Secondary | ICD-10-CM | POA: Diagnosis not present

## 2023-06-02 LAB — BASIC METABOLIC PANEL
Anion gap: 8 (ref 5–15)
BUN: 26 mg/dL — ABNORMAL HIGH (ref 8–23)
CO2: 31 mmol/L (ref 22–32)
Calcium: 8.6 mg/dL — ABNORMAL LOW (ref 8.9–10.3)
Chloride: 94 mmol/L — ABNORMAL LOW (ref 98–111)
Creatinine, Ser: 0.97 mg/dL (ref 0.44–1.00)
GFR, Estimated: >60 mL/min (ref >60)
Glucose, Bld: 102 mg/dL — ABNORMAL HIGH (ref 70–99)
Potassium: 4.3 mmol/L (ref 3.5–5.1)
Sodium: 133 mmol/L — ABNORMAL LOW (ref 135–145)

## 2023-06-02 LAB — FERRITIN: Ferritin: 32 ng/mL (ref 11–307)

## 2023-06-02 LAB — MAGNESIUM: Magnesium: 2.1 mg/dL (ref 1.7–2.4)

## 2023-06-02 LAB — TRANSFERRIN: Transferrin: 304 mg/dL (ref 192–382)

## 2023-06-02 MED ORDER — METOPROLOL SUCCINATE ER 100 MG PO TB24
100.0000 mg | ORAL_TABLET | Freq: Every day | ORAL | Status: DC
  Administered 2023-06-03 – 2023-06-09 (×7): 100 mg via ORAL
  Filled 2023-06-02 (×7): qty 1

## 2023-06-02 MED ORDER — GABAPENTIN 300 MG PO CAPS
600.0000 mg | ORAL_CAPSULE | Freq: Three times a day (TID) | ORAL | Status: DC | PRN
  Administered 2023-06-06 – 2023-06-09 (×7): 600 mg via ORAL
  Filled 2023-06-02 (×7): qty 2

## 2023-06-02 MED ORDER — PREDNISONE 20 MG PO TABS
40.0000 mg | ORAL_TABLET | Freq: Every day | ORAL | Status: AC
  Administered 2023-06-02 – 2023-06-06 (×5): 40 mg via ORAL
  Filled 2023-06-02 (×5): qty 2

## 2023-06-02 MED ORDER — FUROSEMIDE 10 MG/ML IJ SOLN: 60.0000 mg | Freq: Three times a day (TID) | INTRAMUSCULAR | Status: DC

## 2023-06-02 MED ORDER — FUROSEMIDE 10 MG/ML IJ SOLN
80.0000 mg | Freq: Two times a day (BID) | INTRAMUSCULAR | Status: DC
  Administered 2023-06-02 – 2023-06-07 (×10): 80 mg via INTRAVENOUS
  Filled 2023-06-02 (×10): qty 8

## 2023-06-02 NOTE — Progress Notes (Signed)
Subjective:  Autumn [name] says that her breathing worsened overnight and she put the bipap back on for about four hours. She feels swollen in the legs and says she has not urinated as much as prior admissions. The patient says that since taking the bipap off, her breathing has felt stable. She clarifies that she does not have diabetes and does have Parkinson's. She does not have a neurologist since she moved to [facility] and again asks if she can establish follow up with our IM clinic upon discharge. She does not complain of pain to the left extremities.  Objective:  Vital signs in last 24 hours: Vitals:   06/01/23 1424 06/01/23 2010 06/02/23 0046 06/02/23 0435  BP:  97/75  98/71  Pulse: 96 90  90  Resp: 17 16  15   Temp:  98 F (36.7 C)  98.8 F (37.1 C)  TempSrc:  Oral  Oral  SpO2:  100% 97% 90%  Weight:    116.1 kg  Height:       Weight change: 19.2 kg  Intake/Output Summary (Last 24 hours) at 06/02/2023 0727 Last data filed at 06/02/2023 6213 Gross per 24 hour  Intake --  Output 1050 ml  Net -1050 ml   Physical Exam Constitutional:      General: She is not in acute distress.    Appearance: She is obese.     Comments: On 4L oxygen by nasal cannula. Sitting upright at bedside.  HENT:     Head:     Comments: Pulsations of the right ear lobe visualized with patient looking to the left    Nose: Nose normal.  Cardiovascular:     Rate and Rhythm: Tachycardia present. Rhythm irregularly irregular.     Heart sounds: Normal heart sounds.     Comments: Slightly decreased warmth to the left lower extremity and left arm as compared to the right upper and lower extremities. Pulmonary:     Effort: No respiratory distress or retractions.     Breath sounds: Wheezing (throughout both lungs) and rales (bibasilar) present.  Abdominal:     Tenderness: There is no abdominal tenderness.     Comments: See skin  Musculoskeletal:     Right lower leg: 2+ Pitting Edema present.     Left lower  leg: 2+ Pitting Edema present.  Skin:    Comments: Well healed old, linear scar to the left flank  Neurological:     General: No focal deficit present.     Mental Status: She is alert and oriented to person, place, and time. Mental status is at baseline.  Psychiatric:        Mood and Affect: Mood normal.        Behavior: Behavior normal.     Imaging: --DG Chest Portable 1 View Result Date: 05/31/2023 CLINICAL DATA:  Shortness of breath. EXAM: PORTABLE CHEST 1 VIEW COMPARISON:  02/21/2023. FINDINGS: There is congestive heart failure/pulmonary edema characterized by increased interstitial markings with hilar and basilar predominance and bilateral small-to-moderate layering effusions. No pneumothorax. Stable moderately enlarged cardio-mediastinal silhouette. No acute osseous abnormalities. The soft tissues are within normal limits. IMPRESSION: *Congestive heart failure/pulmonary edema with bilateral small-to-moderate layering effusions. Electronically Signed   By: Jules Schick M.D.   On: 05/31/2023 08:08     --CT ANGIOGRAPHY CHEST WITH CONTRAST FINDINGS: Cardiovascular: Atherosclerotic calcifications of the thoracic aorta are noted. No aneurysmal dilatation is seen. Coronary calcifications are noted. The heart is enlarged in size. The pulmonary artery shows a normal  branching pattern bilaterally. No filling defect to suggest pulmonary embolism is noted. Mediastinum/Nodes: Esophagus is within normal limits as visualized. No hilar or mediastinal adenopathy is noted. Scattered small likely reactive lymph nodes are noted in the mediastinum. The thoracic inlet is within normal limits. Lungs/Pleura: Bibasilar consolidation is seen. No sizable effusion is noted. Previously seen vascular congestion and edema has resolved in the interval. No other focal abnormality is noted. Upper Abdomen: Scattered hypodensities are noted within the liver consistent with cysts stable from the prior exam. No  other focal abnormality is noted. Musculoskeletal: Degenerative changes of the thoracic spine are noted. No acute bony abnormality is noted. Review of the MIP images confirms the above findings.  IMPRESSION: No evidence of pulmonary emboli. Bibasilar consolidation. Changes of CHF seen on the prior chest x-ray have resolved. No sizable effusions are seen.  Electronically Signed   By: Alcide Clever M.D.   On: 05/31/2023 18:47  ECHOCARDIOGRAM REPORT: IMPRESSIONS 1. Left ventricular ejection fraction, by estimation, is 30 to 35% . The left ventricle has moderately decreased function. The left ventricle demonstrates global hypokinesis. The left ventricular internal cavity size was moderately dilated. Left ventricular diastolic parameters are indeterminate. 2. Right ventricular systolic function is normal. The right ventricular size is normal. 3. Left atrial size was severely dilated. 4. A small pericardial effusion is present. The pericardial effusion is localized near the right atrium. There is no evidence of cardiac tamponade. 5. The mitral valve is normal in structure. Moderate to severe mitral valve regurgitation. No evidence of mitral stenosis. 6. Tricuspid valve regurgitation is moderate. 7. The aortic valve is normal in structure. Aortic valve regurgitation is mild. No aortic stenosis is present. 8. The inferior vena cava is normal in size with greater than 50% respiratory variability, suggesting right atrial pressure of 3 mmHg.  Assessment/Plan:  Principal Problem:   Acute exacerbation of CHF (congestive heart failure) (HCC)  Autumn [name] is a 67 year old female with past medical hx of PMHx of HFrEF (EF 35%), A fib with RVR, chronic hypoxic respiratory failure, COPD, T2DM, polysubstance use who is admitted for acute on chronic heart failure, acute hypoxic respiratory failure, and afib RVR.   #Acute on Chronic Heart Failure with Reduced Ejection Fraction (LVEF 35%) Admitted with Xray,  BNP, and clinical findings consistent with HF exacerbation. Prior echo LVEF 35%, NYHA IV. Dry weight: 96.9 kg at last discharge 02/22/2023. Home diuretics: Lasix 80 mg once daily. 2L O2 at baseline, on 4L at this time. CXR (8 am) showed Congestive heart failure/pulmonary edema with bilateral small-to-moderate layering effusions, but follow up CT A Chest showed resolution (6 pm). Trigger for decompensation is most likely medication non-compliance. 10/23: Echo showed LVEF 30-35%, global hypokinesis. Patient had signs of decreased perfusion and low BP today, so decreased toprol. She does not have asymmetric pain to the left extremities with her slightly decreased warmth to the left lower and upper extremities. I suspect this is from decreased perfusion. Increased lasix to increase diuresis. Will start home digoxin when her volume status improves. Ferritin is 32, low for HFrEF, so we will replete per FAIR-HF trial (The [place] Journal of Medicine. 2009.361(25):2436-48.) Iron deficit is 820 mg per ganzoni equation. - Increase IV Lasix 80 mg BID for goal net negative 1L/day - Iron: Defer IV iron until she has improved volume status - GDMT:    *BetaB: Decrease Toprol 200 mg to 100 mg due to decreased perfusion    *RAAS: Hold home losartan 25 mg  today due to low BP    *Aldo: Won't start due to low BP    *SGLT: Continue home Farxiga 10 mg    *ICD: Address in the outpatient setting (new LVEF 30-35%) - Strict I/Os, standing daily weights  #Longstanding Persistent Atrial fibrillation with RVR Back on home rivaroxaban 20 mg once daily. She was not anticoagulated for one week. Will start home digoxin when her volume status improves. - Rivaroxaban 20 mg - Metoprolol 100 mg  #Chronic Obstructive Pulmonary Disease Followed [facility] in [place]. On Breztri 2 puffs twice a day, Albuterol. PFT's are up to date and on (06/22/2022) showed mild obstruction.  10/23: Used  bipap for 4 hours this morning after a desat. Examination reveals expiratory wheezing throughout the lungs. Stopped Revefenacin Autumn [name]) as she is on ICS + LABA, and LAMA. Will add steroids. - Add prednisone 40 mg - Duoneb q6h scheduled - Breo ellipta and Incruse Ellipta - Incentive spirometry - Bipap as needed  #History of Hepatitis C Previous history diagnosed by labs on 05/27/2007, unsure if treated. Obtained labs today. Will address if positive. -Hep C antibody w/ reflex to PCR  Chronic Problems T2DM: Patient states that she does not have diabetes. A1c was 6.1% on 10/2022.  MDD: continue abilify 5 mg and duloxetine 30 mg HLD: continue atorvastatin 80 mg. LDL was 109 on 11/2022 Arthritis/Chronic pain: Home tylenol, voltaren, gabapentin 600 mg TID Parkinson's: Home Carbidopa-levodopa. Diagnosed ~08/21/2022; [facility] OUD: Continue home Suboxone 8-2 mg 3 times daily Smoking: Nicoderm 14 mg daily, encourage cessation  Diet: Heart Healthy IVF: None,None VTE:  Xarelto Code: Full PT/OT recs: Home Health, none.   LOS: 2 days   Kayleen Memos, Medical Student 06/02/2023, 7:27 AM

## 2023-06-02 NOTE — Progress Notes (Signed)
   06/02/23 0046  BiPAP/CPAP/SIPAP  $ Non-Invasive Ventilator  Non-Invasive Vent Subsequent  $ Face Mask Medium Yes  BiPAP/CPAP/SIPAP Pt Type Adult  BiPAP/CPAP/SIPAP SERVO  Mask Type Full face mask  Mask Size Medium  Set Rate 14 breaths/min  Respiratory Rate 20 breaths/min  PEEP 5 cmH20  FiO2 (%) 40 %  Minute Ventilation 12.9  Leak 27  Peak Inspiratory Pressure (PIP) 14  Tidal Volume (Vt) 664  Patient Home Equipment No  Auto Titrate No  Press High Alarm 25 cmH2O  Press Low Alarm 5 cmH2O  BiPAP/CPAP /SiPAP Vitals  SpO2 97 %  Bilateral Breath Sounds Diminished

## 2023-06-02 NOTE — Progress Notes (Signed)
Mobility Specialist Progress Note:   06/02/23 1144  Oxygen Therapy  O2 Device Nasal Cannula  O2 Flow Rate (L/min) 3 L/min  Mobility  Activity Ambulated with assistance in hallway  Level of Assistance Contact guard assist, steadying assist  Assistive Device Front wheel walker  Distance Ambulated (ft) 100 ft  Activity Response Tolerated well;Tolerated fair  Mobility Referral Yes  $Mobility charge 1 Mobility  Mobility Specialist Start Time (ACUTE ONLY) 1115  Mobility Specialist Stop Time (ACUTE ONLY) 1133  Mobility Specialist Time Calculation (min) (ACUTE ONLY) 18 min    Pre Mobility: 99 HR,  96% SpO2 During Mobility: 106 HR,  98% SpO2 Post Mobility:  88 HR, 98% SpO2  Pt received in chair, agreeable to mobility. C/o of being sleepy and dizzy. Asymptomatic throughout ambulation. Successful void on BSC upon returning to room. Pt left on EOB with call bell and all needs met.  Autumn [name] Mobility Specialist Please contact via Special educational needs teacher or Rehab office at [phone]

## 2023-06-02 NOTE — Progress Notes (Signed)
Heart Failure Nurse Navigator Progress Note  PCP: Corrington, Kip A, MD PCP-Cardiologist: Timsia Admission Diagnosis: Acute on Chronic Congestive Heart Failure, Paroxysmal Atrial Fibrillation, Acute Respiratory Failure Admitted from: Home by EMS  Presentation:   Autumn [name] presented with worsening shortness of breath for a week or so. At the same time she had run out of her diuretics.  BNP 606.9. HR 130's-150's.  ECHO/ LVEF: 30-35%  Clinical Course:  Past Medical History:  Diagnosis Date   Anxiety    Arthritis    Attention deficit disorder (ADD)    COPD (chronic obstructive pulmonary disease) (HCC)    Depression    Drug abuse (HCC)    Fibromyalgia    HFrEF (heart failure with reduced ejection fraction) (HCC)    Hyperlipidemia    Paroxysmal Atrial fibrillation      Social History   Socioeconomic History   Marital status: Divorced    Spouse name: Not on file   Number of children: 3   Years of education: Not on file   Highest education level: High school graduate  Occupational History   Occupation: disabled  Tobacco Use   Smoking status: Every Day    Current packs/day: 0.50    Average packs/day: 0.5 packs/day for 20.0 years (10.0 ttl pk-yrs)    Types: Cigarettes   Smokeless tobacco: Never   Tobacco comments:    Half pack daily going to try patches  Vaping Use   Vaping status: Every Day   Substances: Nicotine, Flavoring  Substance and Sexual Activity   Alcohol use: Not Currently    Comment: A fifth of vodka a day - none in a year   Drug use: Yes    Types: Cocaine, Marijuana, Methamphetamines    Comment: uses marijuana regularly; used cocaine " a long time ago"   Sexual activity: Not on file  Other Topics Concern   Not on file  Social History Narrative   Not on file   Social Determinants of Health   Financial Resource Strain: Low Risk  (06/02/2023)   Overall Financial Resource Strain (CARDIA)    Difficulty of Paying Living Expenses: Not hard at all   Food Insecurity: Patient Declined (05/31/2023)   Hunger Vital Sign    Worried About Running Out of Food in the Last Year: Patient declined    Ran Out of Food in the Last Year: Patient declined  Transportation Needs: No Transportation Needs (06/02/2023)   PRAPARE - Administrator, Civil Service (Medical): No    Lack of Transportation (Non-Medical): No  Recent Concern: Transportation Needs - Unmet Transportation Needs (05/17/2023)   Received from [facility] - Transportation    Lack of Transportation (Medical): Yes    Lack of Transportation (Non-Medical): Yes  Physical Activity: Unknown (05/17/2023)   Received from [facility]   Exercise Vital Sign    Days of Exercise per Week: 0 days    Minutes of Exercise per Session: Not on file  Stress: No Stress Concern Present (05/17/2023)   Received from [facility] of Occupational [facility] - Occupational Stress Questionnaire    Feeling of Stress : Not at all  Social Connections: Socially Integrated (05/17/2023)   Received from [facility]   Social Network    How would you rate your social network (family, work, friends)?: Good participation with social networks   Education Assessment and Provision:  Detailed education and instructions provided on heart failure disease management including the following:  Signs and symptoms of Heart Failure When to call the physician Importance of daily weights Low sodium diet Fluid restriction Medication management Anticipated future follow-up appointments  Patient education given on each of the above topics.  Patient acknowledges understanding via teach back method and acceptance of all instructions.  Education Materials:  "Living Better With Heart Failure" Booklet, HF zone tool, & Daily Weight Tracker Tool.  Patient has scale at home: Yes Patient has pill box at home: Yes    High Risk Criteria for Readmission and/or Poor Patient Outcomes: Heart  failure hospital admissions (last 6 months): 2  No Show rate: 27 Difficult social situation: No- Lives with one of her children. Demonstrates medication adherence: No Primary Language: English Literacy level: Reading, Writing and Comprehension  Barriers of Care:   Diet & Fluid Restrictions [facility]) Medication Non-Compliance Daily Weights Smoking & Drug Use-Pt states she does Crystal Meth 4 times a week Follow-Up Appointment Non-Compliance  Considerations/Referrals:   Referral made to Heart Failure Pharmacist Stewardship: Yes Referral made to Heart Failure CSW/NCM TOC: No Referral made to Heart & Vascular TOC clinic: Yes  Items for Follow-up on DC/TOC: Diet & Fluid Restrictions  Medication  Daily Weights Nicotine & Drug Use-Crystal Met 4 times a week Patient has missed her last 2 TOC appointments that were scheduled Continued Heart Failure Education    Roxy Horseman, RN, BSN [facility] Heart Failure Navigator Secure Chat Only

## 2023-06-02 NOTE — Progress Notes (Signed)
Pt took the BIPAP off, education provided, will continue to monitor.

## 2023-06-02 NOTE — Progress Notes (Signed)
Heart Failure Stewardship Pharmacist Progress Note   PCP: Corrington, Kip A, MD PCP-Cardiologist: Thomasene Ripple, DO    HPI:  89YOF with PMH of CHF, polysubstance abuse on suboxone, HTN, Afib with RVR, COPD, parkinsons, PAD, CAD s/p PCI to LAD (2020). Frequent admissions for CHF exacerbations.  Initially presenting to ED on 10/21 via EMS with SOB x 2-3 days, wheezing, non-adherence to fluid pills and DOAC x1 week. Placed on BiPAP, on 3L [place] at home. Labs demonstrated respiratory acidosis with pH 7.2, pCO2 71, pO2 195. CXR 10/21 showed CHF/pulmonary edema with bilateral small-to-mod layering effusions. BNP elevated. CTA 10/21 without evidence of PE. Echo 10/22 with LVEF 30-35% (prev 35-40% in Jun 2024), global hypokinesis, moderately dilated, RV normal, mod-severe mitral regurgitation.  Pt is no longer needing BiPAP post-IV diuresis. However, pt reports that she does not feel any better- same as she did at admission. Denies dizziness/lightheadedness going from laying to sitting, but states that she has not moved around/stood up yet. Pt is sitting in chair upon interview today. Reports that she ran out of her medications ~1 week ago - was trying to transfer them from [facility] to her usual [facility].   States that she organizes and takes her medications at home. Was previously using a pill box until she ran out of lasix about one week ago, then transitioned to taking pills out of the bottles. States that her family (who she lives with) helps her pick up the medications from [place]. Discussed whether she would be interested in mail order - but pt is unsure.   Current HF Medications: Diuretic: furosemide 80 mg IV BID Beta Blocker: metoprolol succinate 100 mg PO daily ACE/ARB/ARNI: losartan 25 mg PO daily - holding with hypotension SGLT2i: dapagliflozin (Farxiga) 10 mg PO daily  Prior to admission HF Medications: Diuretic: furosemide 80 mg PO daily (last dispensed 04/14/23 for 30ds) Beta  blocker: metoprolol succinate 200 mg PO daily (last dispensed 02/22/23 for 30ds) - pt stated that she was taking 100 mg PO daily at home ACE/ARB/ARNI: losartan 25 mg PO daily (last dispensed 02/22/23 for 30ds) SGLT2i: dapagliflozin (Farxiga) 10  mg PO daily (last dispensed 04/11/23 for 30ds) Other: digoxin 0.25 mg PO daily (last dispensed 02/22/23 for 30ds)   Pt had also been out of rivaroxaban for at least 1 week.   Pertinent Lab Values: Serum creatinine 0.97  (eGFR >60), BUN 26, Potassium 4.3, Sodium 133, BNP 609, Magnesium 2.1, A1c 6.1% March 2024, digoxin <0.2 ng/mL  Vital Signs: Weight: 255.95 lbs (admission weight: 255.95 lbs) - dry weight 213 lbs Blood pressure: 80-100s/50-70s Heart rate: 90-100s (AF)  I/O: net -0.3L yesterday; net -0.8L since admission(unsure whether accurately documented)  Medication Assistance / Insurance Benefits Check: Does the patient have prescription insurance?  Yes Type of insurance plan: UHC Dual Complete Medicare/Medicaid -$0 copay for medications  Does the patient qualify for medication assistance through manufacturers or grants?   No  Outpatient Pharmacy:  Prior to admission [facility] [address] [facility] [address] Is the patient willing to use [facility] TOC pharmacy at discharge? Yes Is the patient willing to transition their outpatient pharmacy to utilize a [facility] outpatient pharmacy?   Pending - at this time pt would prefer [facility] using [facility] to [facility] her medications.     Assessment: 1. Acute on chronic systolic CHF (LVEF 30-35%), due to ischemic causes - exacerbation spurred by medication noncompliance. NYHA class IV symptoms, complicated by severe COPD non-adherent to maintenance inhaler. -  Pt remains hypervolemic on exam with bilateral pitting edema. Reports that she has not had an adequate response to IV diuresis. Agree with increasing IV loop diuretic intensity. - Pt received higher dose of metoprolol this AM and became  hypotensive with s/sx of hypoperfusion per medical team's exam. Agree with decreasing dose of beta blocker, and temporarily holding losartan.  - While pt would benefit from ARNI - unlikely that pt would be adherent to twice daily medication. Ok to continue once daily ARB once BP normalizes.  - Pt has never taken MRA. Kidney function stable, K < 5. Should consider initiating prior to discharge to help with diuresis and reduce morbidity/mortality. Would prefer starting this agent vs digoxin considering hx of noncompliance.  - Ok to continue SGLT2i as pt does not complain of any AE at this time. - Pt at high risk of readmission and worsening HF due to medication noncompliance - pt would potentially benefit from mail order pharmacy, but she is hesitant to switch at this time.   Plan: 1) Medication changes recommended at this time: - Continue furosemide 80 mg IV BID - Agree with reduction of metoprolol succinate to 100 mg PO daily - Continue dapagliflozin (Farxiga) 10 mg PO daily - Reevaluate ability to resume losartan 25 mg PO daily tomorrow pending normalization of BP. Alternatively consider spironolactone 12.5 mg PO daily which would have minimal impact on BP.   2) Patient assistance: - No patient assistance needs noted at this time.   3)  Education  - Initial education complete including importance of adherence to medications to prevent morbidity, mortality, hospital admission. Discussed that medications will likely be adjusted while she is admitted, and may continue to be adjusted at hospital f/u. Full education to be completed prior to discharge  Nils Pyle, PharmD PGY1 Pharmacy Resident

## 2023-06-03 DIAGNOSIS — I5023 Acute on chronic systolic (congestive) heart failure: Secondary | ICD-10-CM | POA: Diagnosis not present

## 2023-06-03 LAB — COMPREHENSIVE METABOLIC PANEL
ALT: 18 U/L (ref 0–44)
AST: 19 U/L (ref 15–41)
Albumin: 2.6 g/dL — ABNORMAL LOW (ref 3.5–5.0)
Alkaline Phosphatase: 68 U/L (ref 38–126)
Anion gap: 8 (ref 5–15)
BUN: 21 mg/dL (ref 8–23)
CO2: 35 mmol/L — ABNORMAL HIGH (ref 22–32)
Calcium: 8.4 mg/dL — ABNORMAL LOW (ref 8.9–10.3)
Chloride: 93 mmol/L — ABNORMAL LOW (ref 98–111)
Creatinine, Ser: 0.93 mg/dL (ref 0.44–1.00)
GFR, Estimated: >60 mL/min (ref >60)
Glucose, Bld: 139 mg/dL — ABNORMAL HIGH (ref 70–99)
Potassium: 4 mmol/L (ref 3.5–5.1)
Sodium: 136 mmol/L (ref 135–145)
Total Bilirubin: 0.4 mg/dL (ref 0.3–1.2)
Total Protein: 6.1 g/dL — ABNORMAL LOW (ref 6.5–8.1)

## 2023-06-03 LAB — HCV RT-PCR, QUANT (NON-GRAPH): Hepatitis C Quantitation: NOT DETECTED [IU]/mL

## 2023-06-03 LAB — HCV AB W REFLEX TO QUANT PCR: HCV Ab: REACTIVE — AB

## 2023-06-03 LAB — MAGNESIUM: Magnesium: 1.9 mg/dL (ref 1.7–2.4)

## 2023-06-03 MED ORDER — MAGNESIUM SULFATE 2 GM/50ML IV SOLN
2.0000 g | Freq: Once | INTRAVENOUS | Status: AC
  Administered 2023-06-03: 2 g via INTRAVENOUS
  Filled 2023-06-03: qty 50

## 2023-06-03 MED ORDER — LORAZEPAM 1 MG PO TABS
1.0000 mg | ORAL_TABLET | Freq: Once | ORAL | Status: AC | PRN
  Administered 2023-06-03: 1 mg via ORAL
  Filled 2023-06-03: qty 1

## 2023-06-03 NOTE — TOC Progression Note (Signed)
Transition of Care [facility]) - Progression Note    Patient Details  Name: Autumn [name] MRN: [id number] Date of Birth: 1955/12/24  Transition of Care [facility]) CM/SW Contact  Huston Foley Jacklynn Ganong, RN Phone Number: 06/03/2023, 12:17 PM  Clinical Narrative:    Case Manager spoke with patient concerning recommendation for physical therapy at home when discharged. Patient has no  Preference for [facility] agency, referral called to [facility], Lorenza Chick. Patient has no dmE needs and will have family support at discharge.   Expected Discharge Plan: Home w Home Health Services Barriers to Discharge: Continued Medical Work up  Expected Discharge Plan and Services   Discharge Planning Services: CM Consult Post Acute Care Choice: Home Health Living arrangements for the past 2 months: Single Family Home                 DME Arranged: N/A DME Agency: NA       HH Arranged: PT HH AgencyHotel manager Home Health Care Date [facility] Agency Contacted: 06/03/23 Time HH Agency Contacted: 1158 Representative spoke with at [facility] Agency: Lorenza Chick   Social Determinants of Health (SDOH) Interventions SDOH Screenings   Food Insecurity: Patient Declined (05/31/2023)  Housing: Patient Declined (06/02/2023)  Transportation Needs: No Transportation Needs (06/02/2023)  Recent Concern: Transportation Needs - Unmet Transportation Needs (05/17/2023)   Received from [facility]  Utilities: Patient Declined (05/31/2023)  Alcohol Screen: Low Risk  (06/02/2023)  Financial Resource Strain: Low Risk  (06/02/2023)  Physical Activity: Unknown (05/17/2023)   Received from [facility]  Social Connections: Socially Integrated (05/17/2023)   Received from [facility]  Stress: No Stress Concern Present (05/17/2023)   Received from [facility]  Tobacco Use: High Risk (05/31/2023)    Readmission Risk Interventions    01/21/2023    1:29 PM 12/07/2022    1:27 PM  Readmission Risk Prevention Plan  Transportation  Screening Complete Complete  PCP or Specialist Appt within 3-5 Days  Complete  HRI or Home Care Consult  Complete  Social Work Consult for Recovery Care Planning/Counseling  Complete  Palliative Care Screening  Not Applicable  Medication Review [employer]) Complete Complete  PCP or Specialist appointment within 3-5 days of discharge Complete   HRI or Home Care Consult Complete   SW Recovery Care/Counseling Consult Complete   Palliative Care Screening Not Applicable   Skilled Nursing Facility Not Applicable

## 2023-06-03 NOTE — Progress Notes (Signed)
Occupational Therapy Treatment Patient Details Name: Autumn [name] MRN: [id number] DOB: Feb 05, 1956 Today's Date: 06/03/2023   History of present illness Pt is a 67 year old female admitted 10/21  presenting to the ED with worsening shortness of breath and found to be in CHF with episodes of VTACH.  PMHx  HFrEF (EF 35%), A fib with RVR, chronic hypoxic respiratory failure, T2DM, polysubstance use   OT comments  Pt continues to make progress with functional goals. Pt's HR in 90s to 104 during in room activity with supervision. O2 SATs > 93% on 3L O2 [place]. Pt educated on energy conservation strategies with handout provided and pt verbalized understanding. OT will continue to follow acutely to maximize level of function and safety       If plan is discharge home, recommend the following:  Assist for transportation;A little help with bathing/dressing/bathroom   Equipment Recommendations  None recommended by OT    Recommendations for Other Services      Precautions / Restrictions Precautions Precautions: Fall Restrictions Weight Bearing Restrictions: No       Mobility Bed Mobility Overal bed mobility: Needs Assistance Bed Mobility: Supine to Sit     Supine to sit: Supervision     General bed mobility comments: No physical assist needed    Transfers Overall transfer level: Needs assistance Equipment used: None Transfers: Sit to/from Stand, Bed to chair/wheelchair/BSC Sit to Stand: Supervision     Step pivot transfers: Supervision     General transfer comment: speedy movement walking from bed to [facility] with 360 turn to sit on BSC getting tangled in lines     Balance Overall balance assessment: Needs assistance Sitting-balance support: Feet supported Sitting balance-Leahy Scale: Good     Standing balance support: Single extremity supported, During functional activity Standing balance-Leahy Scale: Fair                             ADL either performed or  assessed with clinical judgement   ADL Overall ADL's : Needs assistance/impaired     Grooming: Wash/dry hands;Wash/dry face;Supervision/safety;Standing       Lower Body Bathing: Supervison/ safety;Sitting/lateral leans;Sit to/from stand Lower Body Bathing Details (indicate cue type and reason): simulated         Toilet Transfer: Supervision/safety;Ambulation;BSC/3in1;Cueing for safety Toilet Transfer Details (indicate cue type and reason): verbal cues safety with lines Toileting- Clothing Manipulation and Hygiene: Supervision/safety;Sit to/from stand       Functional mobility during ADLs: Supervision/safety;Cueing for safety General ADL Comments: Pt's HR in 90s to 104 during in room activity with supervision. O2 SATs > 93% on 3L O2 [place]. Pt educated on energy conservation strategies with handout provided and pt verbalized understanding    Extremity/Trunk Assessment Upper Extremity Assessment Upper Extremity Assessment: Overall WFL for tasks assessed   Lower Extremity Assessment Lower Extremity Assessment: Defer to PT evaluation   Cervical / Trunk Assessment Cervical / Trunk Assessment: Normal    Vision Patient Visual Report: No change from baseline     Perception     Praxis      Cognition Arousal: Alert Behavior During Therapy: WFL for tasks assessed/performed, Impulsive Overall Cognitive Status: Within Functional Limits for tasks assessed                                 General Comments: speedy movement walking from bed to [facility] with 360 turn  to sit on [facility] getting tangled in lines        Exercises      Shoulder Instructions       General Comments      Pertinent Vitals/ Pain       Pain Assessment Pain Assessment: No/denies pain  Home Living                                          Prior Functioning/Environment              Frequency  Min 1X/week        Progress Toward Goals  OT Goals(current goals can now  be found in the care plan section)  Progress towards OT goals: Progressing toward goals     Plan      Co-evaluation                 AM-PAC OT "6 Clicks" Daily Activity     Outcome Measure   Help from another person eating meals?: None Help from another person taking care of personal grooming?: A Little Help from another person toileting, which includes using toliet, bedpan, or urinal?: A Little Help from another person bathing (including washing, rinsing, drying)?: A Little Help from another person to put on and taking off regular upper body clothing?: None Help from another person to put on and taking off regular lower body clothing?: A Little 6 Click Score: 20    End of Session Equipment Utilized During Treatment: Oxygen;Other (comment) (BSC)  OT Visit Diagnosis: Unsteadiness on feet (R26.81);Muscle weakness (generalized) (M62.81)   Activity Tolerance Patient tolerated treatment well   Patient Left with call bell/phone within reach;in bed;Other (comment);with nursing/sitter in room (sitting EOB)   Nurse Communication          Time: 4259-5638 OT Time Calculation (min): 25 min  Charges: OT General Charges $OT Visit: 1 Visit OT Treatments $Self Care/Home Management : 8-22 mins $Therapeutic Activity: 8-22 mins    Galen Manila 06/03/2023, 12:04 PM

## 2023-06-03 NOTE — Progress Notes (Signed)
   06/02/23 2219  BiPAP/CPAP/SIPAP  Reason BIPAP/CPAP not in use Non-compliant (refused, bipap prn order, pt not in any distress)  BiPAP/CPAP /SiPAP Vitals  Pulse Rate 78  Resp 13  SpO2 97 %  MEWS Score/Color  MEWS Score 1  MEWS Score Color Green   Servo on standby

## 2023-06-03 NOTE — Care Management Important Message (Signed)
Important Message  Patient Details  Name: DARRIANA DENN MRN: [id number] Date of Birth: [birth year]   Important Message Given:  Yes - Medicare IM     Sherilyn Banker 06/03/2023, 12:08 PM

## 2023-06-03 NOTE — Progress Notes (Signed)
Physical Therapy Treatment Patient Details Name: Autumn [name] MRN: [id number] DOB: 08/03/1956 Today's Date: 06/03/2023   History of Present Illness Pt is a 67 year old female admitted 10/21  presenting to the ED with worsening shortness of breath and found to be in CHF with episodes of VTACH.  PMHx  HFrEF (EF 35%), A fib with RVR, chronic hypoxic respiratory failure, T2DM, polysubstance use    PT Comments  Pt admitted with above diagnosis. Pt was able to ambulate with RW and progress distance. Continues to need 3LO2 as well as cues for pursed lip breathing.  No LOB.  Pt is impulsive at times.  Pt currently with functional limitations due to the deficits listed below (see PT Problem List). Pt will benefit from acute skilled PT to increase their independence and safety with mobility to allow discharge.       If plan is discharge home, recommend the following: A little help with walking and/or transfers;Assistance with cooking/housework;Assist for transportation;Help with stairs or ramp for entrance   Can travel by private vehicle        Equipment Recommendations  None recommended by PT    Recommendations for Other Services       Precautions / Restrictions Precautions Precautions: Fall Restrictions Weight Bearing Restrictions: No     Mobility  Bed Mobility Overal bed mobility: Needs Assistance Bed Mobility: Supine to Sit     Supine to sit: Supervision     General bed mobility comments: No physical assist needed    Transfers Overall transfer level: Needs assistance Equipment used: None Transfers: Sit to/from Stand, Bed to chair/wheelchair/BSC Sit to Stand: Supervision   Step pivot transfers: Supervision       General transfer comment: cues for safety as pt is impulsive    Ambulation/Gait Ambulation/Gait assistance: Supervision Gait Distance (Feet): 150 Feet Assistive device: Rolling walker (2 wheels) Gait Pattern/deviations: Step-through pattern, Decreased  stride length   Gait velocity interpretation: 1.31 - 2.62 ft/sec, indicative of limited community ambulator   General Gait Details: Pt did well and doesnt lose balance with min challenges.  Did need 3LO2 to keep sats >88%. Other vSS   Stairs             Wheelchair Mobility     Tilt Bed    Modified Rankin (Stroke Patients Only)       Balance Overall balance assessment: Needs assistance Sitting-balance support: Feet supported Sitting balance-Leahy Scale: Good     Standing balance support: During functional activity, Bilateral upper extremity supported Standing balance-Leahy Scale: Poor Standing balance comment: relies on RW                            Cognition Arousal: Alert Behavior During Therapy: WFL for tasks assessed/performed, Impulsive Overall Cognitive Status: Within Functional Limits for tasks assessed                                          Exercises General Exercises - Lower Extremity Ankle Circles/Pumps: AROM, Both, 10 reps, Supine Long Arc Quad: AROM, Both, 10 reps, Seated Hip Flexion/Marching: AROM, Both, 10 reps, Seated    General Comments General comments (skin integrity, edema, etc.): 105-118 bpm, 88% and > on 3LO2 with activity      Pertinent Vitals/Pain Pain Assessment Pain Assessment: No/denies pain    Home Living  Prior Function            PT Goals (current goals can now be found in the care plan section) Acute Rehab PT Goals Patient Stated Goal: to go home Progress towards PT goals: Progressing toward goals    Frequency    Min 1X/week      PT Plan      Co-evaluation              AM-PAC PT "6 Clicks" Mobility   Outcome Measure  Help needed turning from your back to your side while in a flat bed without using bedrails?: None Help needed moving from lying on your back to sitting on the side of a flat bed without using bedrails?: A Little Help  needed moving to and from a bed to a chair (including a wheelchair)?: A Little Help needed standing up from a chair using your arms (e.g., wheelchair or bedside chair)?: A Little Help needed to walk in hospital room?: A Little Help needed climbing 3-5 steps with a railing? : Total 6 Click Score: 17    End of Session Equipment Utilized During Treatment: Gait belt;Oxygen Activity Tolerance: Patient limited by fatigue Patient left: in chair;with call bell/phone within reach Nurse Communication: Mobility status PT Visit Diagnosis: Muscle weakness (generalized) (M62.81)     Time: 4098-1191 PT Time Calculation (min) (ACUTE ONLY): 15 min  Charges:    $Gait Training: 8-22 mins PT General Charges $$ ACUTE PT VISIT: 1 Visit                     Marcelia Petersen M,PT Acute Rehab Services [phone]    Bevelyn Buckles 06/03/2023, 1:37 PM

## 2023-06-03 NOTE — Progress Notes (Addendum)
Heart Failure Stewardship Pharmacist Progress Note   PCP: Corrington, Kip A, MD PCP-Cardiologist: Thomasene Ripple, DO    HPI:  71YOF with PMH of CHF, polysubstance abuse on suboxone, HTN, Afib with RVR, COPD, parkinsons, PAD, CAD s/p PCI to LAD (2020). Frequent admissions for CHF exacerbations.  Initially presenting to ED on 10/21 via EMS with SOB x 2-3 days, wheezing, non-adherence to fluid pills and DOAC x1 week. Placed on BiPAP, on 3L [place] at home. Labs demonstrated respiratory acidosis with pH 7.2, pCO2 71, pO2 195. CXR 10/21 showed CHF/pulmonary edema with bilateral small-to-mod layering effusions. BNP elevated. CTA 10/21 without evidence of PE. Echo 10/22 with LVEF 30-35% (prev 35-40% in Jun 2024), global hypokinesis, moderately dilated, RV normal, mod-severe mitral regurgitation.  Pt is no longer needing BiPAP post-IV diuresis. However, pt reports that she does not feel any better- same as she did at admission. Denies dizziness/lightheadedness going from laying to sitting, but states that she has not moved around/stood up yet. Pt is sitting in chair upon interview today. Reports that she ran out of her medications ~1 week ago - was trying to transfer them from [facility] to her usual [facility].   States that she organizes her medications at home. Was previously using a pill box (with one slot/day) until she ran out of lasix about one week ago, then transitioned to taking pills out of the bottles. States that her family (who she lives with) helps her pick up the medications from [place]. Discussed whether she would be interested in mail order - but pt is unsure. Today she expresses some concern about who will be able to pick up her medications after discharge.   Current HF Medications: Diuretic: furosemide 80 mg IV BID (last dose 10/24 at 8AM) Beta Blocker: metoprolol succinate 100 mg PO daily SGLT2i: dapagliflozin (Farxiga) 10 mg PO daily  Prior to admission HF  Medications: Diuretic: furosemide 80 mg PO daily (last dispensed 04/14/23 for 30ds) Beta blocker: metoprolol succinate 200 mg PO daily (last dispensed 02/22/23 for 30ds) - pt stated that she was taking 100 mg PO daily at home ACE/ARB/ARNI: losartan 25 mg PO daily (last dispensed 02/22/23 for 30ds) SGLT2i: dapagliflozin (Farxiga) 10  mg PO daily (last dispensed 04/11/23 for 30ds) Other: digoxin 0.25 mg PO daily (last dispensed 02/22/23 for 30ds)   Pt had also been out of rivaroxaban for at least 1 week.   Pertinent Lab Values: Serum creatinine 0.93  (eGFR >60), BUN 21, Potassium 4.0, Sodium 136, BNP 609, Magnesium 2.1, A1c 6.1% March 2024, digoxin <0.2 ng/mL  Vital Signs: Weight: 247.7 lbs (admission weight: 255.95 lbs) - dry weight 213 lbs Blood pressure: 100-120s/60-70s Heart rate: 70-90s (AF)  I/O: net -3.8L yesterday; net -4.1L since admission (good documentation on 10/23 and 10/24)  Medication Assistance / Insurance Benefits Check: Does the patient have prescription insurance?  Yes Type of insurance plan: UHC Dual Complete Medicare/Medicaid -$0 copay for medications  Does the patient qualify for medication assistance through manufacturers or grants?   No  Outpatient Pharmacy:  Prior to admission [facility] [address] [facility] [address] Is the patient willing to use [facility] TOC pharmacy at discharge? Yes Is the patient willing to transition their outpatient pharmacy to utilize a [facility] outpatient pharmacy?   Pending - at this time pt would prefer to [facility] using [facility] to fill her medications.     Assessment: 1. Acute on chronic systolic CHF (LVEF 30-35%), due to ischemic causes - exacerbation  spurred by medication noncompliance. NYHA class IV symptoms, complicated by severe COPD non-adherent to maintenance inhaler. - Pt remains hypervolemic on exam, bilateral pitting edema still present but improved. Pt responded well to increased dose of IV furosemide (weight  down ~8 lbs).  - BP has remained stable today. Pt denies s/sx of hypotension. Agree with holding home ARB, but could consider addition of low-dose MRA which would minimally impact BP, help with diuresis, and reduce morbidity/mortality.  - While pt would benefit from ARNI - unlikely that pt would be adherent to twice daily medication. Ok to continue once daily ARB once BP normalizes.  - Would not restart home digoxin as pt is non-compliant and could likely tolerate additional GDMT. HR controlled on metoprolol. - Pt at high risk of readmission and worsening HF due to medication noncompliance - pt would potentially benefit from mail order pharmacy, but she is hesitant to switch at this time. Will try to set pt up with pill box and 90 day supplies of medication prior to discharge.   Plan: 1) Medication changes recommended at this time: - Continue furosemide 80 mg IV BID - Continue metoprolol succinate to 100 mg PO daily - Continue dapagliflozin (Farxiga) 10 mg PO daily - Consider starting spironolactone 12.5 mg PO daily prior to discharge  2) Patient assistance: - No patient assistance needs noted at this time.   3)  Education  - Initial education complete including importance of adherence to medications to prevent morbidity, mortality, hospital admission. Discussed that medications will likely be adjusted while she is admitted, and may continue to be adjusted at hospital f/u. Full education to be completed prior to discharge  Nils Pyle, PharmD PGY1 Pharmacy Resident

## 2023-06-03 NOTE — Progress Notes (Addendum)
Subjective:  Autumn [name] says that she is feeling improved and urinated a significant amount since yesterday. The patient feels warm in her extremities. She states her breathing is easier. The patient expresses that she does not want to be in the hospital for long as she prefers to be at home.  Objective:  Vital signs in last 24 hours: Vitals:   06/03/23 0300 06/03/23 0440 06/03/23 0600 06/03/23 0721  BP:  101/71  115/63  Pulse: (!) 101 83 84   Resp:  20  18  Temp:  99.4 F (37.4 C)  98.6 F (37 C)  TempSrc:  Oral  Oral  SpO2: 99% 98% 100%   Weight:  112.4 kg    Height:       Weight change: -2.2 kg  Intake/Output Summary (Last 24 hours) at 06/03/2023 0751 Last data filed at 06/03/2023 1610 Gross per 24 hour  Intake 1371 ml  Output 4750 ml  Net -3379 ml   Physical Exam Constitutional:      General: She is not in acute distress.    Comments: Reduced from 3 to 2L oxygen by nasal cannula. Sitting upright at bedside.  HENT:     Nose: Nose normal.  Cardiovascular:     Rate and Rhythm: Tachycardia present. Rhythm irregularly irregular.     Heart sounds: Normal heart sounds.     Comments: Equal warmth to upper and lower extremities bilaterally Pulmonary:     Effort: No respiratory distress or retractions.     Breath sounds: Wheezing (scattered, expiratory) present.  Abdominal:     Tenderness: There is no abdominal tenderness.     Comments: See skin  Musculoskeletal:     Right lower leg: 2+ Pitting Edema present.     Left lower leg: 2+ Pitting Edema present.  Skin:    General: Skin is warm.     Capillary Refill: Capillary refill takes more than 3 seconds.     Coloration: Skin is not pale.     Comments: Well healed old, linear scar to the left flank  Neurological:     General: No focal deficit present.     Mental Status: She is alert and oriented to person, place, and time. Mental status is at baseline.  Psychiatric:        Mood and Affect: Mood normal.        Behavior:  Behavior normal.    Assessment/Plan:  Principal Problem:   Acute exacerbation of CHF (congestive heart failure) (HCC) Active Problems:   Polysubstance abuse (HCC)   Atrial fibrillation with rapid ventricular response (HCC)   COPD (chronic obstructive pulmonary disease) (HCC)   CAD (coronary artery disease) S/P stent to LAD   Acute hypoxic respiratory failure (HCC)  Autumn [name] is a 67 year old female with past medical hx of PMHx of HFrEF (EF 35%), A fib with RVR, chronic hypoxic respiratory failure, COPD, T2DM, polysubstance use who is admitted for acute on chronic heart failure, acute hypoxic respiratory failure, and afib RVR.   #Acute on Chronic Heart Failure with Reduced Ejection Fraction (LVEF 35%) Admitted with Xray, BNP, and clinical findings consistent with HF exacerbation. Prior echo LVEF 35%, NYHA IV. Dry weight: 96.9 kg at last discharge 02/22/2023. Home diuretics: Lasix 80 mg once daily. 3L at baseline. CXR (8 am) showed Congestive heart failure/pulmonary edema with bilateral small-to-moderate layering effusions, but follow up CT A Chest showed resolution (6 pm). Trigger for decompensation is most likely medication non-compliance. 10/23: Patient is feeling and appears  better today. She was on 3L O2 sat at 98% and appears well perfused. Still defer digoxin as heart rate has improved and it appears that she has not been on it since June. Ferritin is 32, low for HFrEF, so we will replete closer to discharge. Iron deficit is 820 mg per ganzoni equation. - Continue IV Lasix 80 mg BID for goal net negative 2L/day - Iron: Defer IV iron until she has improved volume status - GDMT:    *BetaB: Continue 100 mg due to decreased perfusion    *RAAS: Hold home losartan 25 mg today    *Aldo: Won't start due to low BP    *SGLT: Continue home Farxiga 10 mg    *ICD: Address in the outpatient setting (new LVEF 30-35%) - Strict I/Os, standing daily weights  #Longstanding Persistent Atrial  fibrillation with RVR Back on home rivaroxaban 20 mg once daily. She was not anticoagulated for one week. Per records it appear that she was not taking digoxin. Will defer starting it. - Rivaroxaban 20 mg - Metoprolol 100 mg  #Chronic Obstructive Pulmonary Disease [facility] [place]. On Breztri 2 puffs twice a day, Albuterol. PFT's are up to date and on (06/22/2022) showed mild obstruction.  10/24: Declined bipap overnight and O2 sat's are >93% - Prednisone 40 mg - Duoneb q6h scheduled - Breo ellipta and Incruse Ellipta - Incentive spirometry - Bipap as needed  #History of Hepatitis C Previous history diagnosed by labs on 05/27/2007, unsure if treated. Obtained labs today. Will address if positive. - Hep C antibody w/ reflex to PCR  Chronic Problems T2DM: Patient states that she does not have diabetes. A1c was 6.1% on 10/2022.  MDD: continue abilify 5 mg and duloxetine 30 mg HLD: continue atorvastatin 80 mg. LDL was 109 on 11/2022 Arthritis/Chronic pain: Home tylenol, voltaren, gabapentin 600 mg TID Parkinson's: Defer Carbidopa-levodopa. Diagnosed ~08/21/2022; [facility] OUD: Continue home Suboxone 8-2 mg 3 times daily Smoking: Nicoderm 14 mg daily, encourage cessation  Diet: Heart Healthy IVF: None,None VTE:  Xarelto Code: Full PT/OT recs: Home Health, none.   LOS: 3 days   Kayleen Memos, Medical Student 06/03/2023, 7:51 AM

## 2023-06-03 NOTE — Plan of Care (Signed)
  Problem: Education: Goal: Knowledge of General Education information will improve Description: Including pain rating scale, medication(s)/side effects and non-pharmacologic comfort measures Outcome: Progressing   Problem: Clinical Measurements: Goal: Will remain free from infection Outcome: Progressing   Problem: Coping: Goal: Level of anxiety will decrease Outcome: Progressing   Problem: Elimination: Goal: Will not experience complications related to urinary retention Outcome: Progressing   Problem: Pain Managment: Goal: General experience of comfort will improve Outcome: Progressing

## 2023-06-04 DIAGNOSIS — I5023 Acute on chronic systolic (congestive) heart failure: Secondary | ICD-10-CM | POA: Diagnosis not present

## 2023-06-04 LAB — BASIC METABOLIC PANEL
Anion gap: 11 (ref 5–15)
BUN: 18 mg/dL (ref 8–23)
CO2: 34 mmol/L — ABNORMAL HIGH (ref 22–32)
Calcium: 8.5 mg/dL — ABNORMAL LOW (ref 8.9–10.3)
Chloride: 93 mmol/L — ABNORMAL LOW (ref 98–111)
Creatinine, Ser: 0.83 mg/dL (ref 0.44–1.00)
GFR, Estimated: >60 mL/min (ref >60)
Glucose, Bld: 87 mg/dL (ref 70–99)
Potassium: 3.5 mmol/L (ref 3.5–5.1)
Sodium: 138 mmol/L (ref 135–145)

## 2023-06-04 LAB — MAGNESIUM: Magnesium: 2.3 mg/dL (ref 1.7–2.4)

## 2023-06-04 MED ORDER — SODIUM CHLORIDE 0.9 % IV SOLN
500.0000 mg | Freq: Once | INTRAVENOUS | Status: AC
  Administered 2023-06-04: 500 mg via INTRAVENOUS
  Filled 2023-06-04: qty 25

## 2023-06-04 MED ORDER — IPRATROPIUM-ALBUTEROL 0.5-2.5 (3) MG/3ML IN SOLN
3.0000 mL | Freq: Four times a day (QID) | RESPIRATORY_TRACT | Status: DC | PRN
  Administered 2023-06-04 – 2023-06-07 (×3): 3 mL via RESPIRATORY_TRACT
  Filled 2023-06-04 (×3): qty 3

## 2023-06-04 MED ORDER — POTASSIUM CHLORIDE CRYS ER 20 MEQ PO TBCR
50.0000 meq | EXTENDED_RELEASE_TABLET | Freq: Once | ORAL | Status: AC
  Administered 2023-06-04: 50 meq via ORAL
  Filled 2023-06-04: qty 1

## 2023-06-04 MED ORDER — DEXTROMETHORPHAN POLISTIREX ER 30 MG/5ML PO SUER
30.0000 mg | Freq: Three times a day (TID) | ORAL | Status: DC | PRN
  Administered 2023-06-04 – 2023-06-08 (×2): 30 mg via ORAL
  Filled 2023-06-04 (×3): qty 5

## 2023-06-04 MED ORDER — IRON SUCROSE 200 MG IVPB - SIMPLE MED: 200.0000 mg | Status: DC

## 2023-06-04 MED ORDER — IRON SUCROSE 500 MG IVPB - SIMPLE MED: 500.0000 mg | Freq: Once | INTRAVENOUS | Status: DC

## 2023-06-04 NOTE — Progress Notes (Signed)
Seen and examined by Dr. Ned Card.

## 2023-06-04 NOTE — Progress Notes (Signed)
Heart Failure Stewardship Pharmacist Progress Note   PCP: Corrington, Kip A, MD PCP-Cardiologist: Thomasene Ripple, DO    HPI:  80YOF with PMH of CHF, polysubstance abuse on suboxone, HTN, Afib with RVR, COPD, parkinsons, PAD, CAD s/p PCI to LAD (2020). Frequent admissions for CHF exacerbations.  Initially presenting to ED on 10/21 via EMS with SOB x 2-3 days, wheezing, non-adherence to fluid pills and DOAC x1 week. Placed on BiPAP, on 3L [place] at home. Labs demonstrated respiratory acidosis with pH 7.2, pCO2 71, pO2 195. CXR 10/21 showed CHF/pulmonary edema with bilateral small-to-mod layering effusions. BNP elevated. CTA 10/21 without evidence of PE. Echo 10/22 with LVEF 30-35% (prev 35-40% in Jun 2024), global hypokinesis, moderately dilated, RV normal, mod-severe mitral regurgitation.  Pt is no longer needing BiPAP post-IV diuresis. However, pt reports that she does not feel any better- same as she did at admission. Denies dizziness/lightheadedness going from laying to sitting, but states that she has not moved around/stood up yet. Pt is sitting in chair upon interview today. Reports that she ran out of her medications ~1 week ago - was trying to transfer them from [facility] to her usual [facility].   States that she organizes her medications at home. Was previously using a pill box (with one slot/day) until she ran out of lasix about one week ago, then transitioned to taking pills out of the bottles. States that her family (who she lives with) helps her pick up the medications from [place]. Discussed whether she would be interested in mail order - but pt is unsure. Today she expresses some concern about who will be able to pick up her medications after discharge.   Current HF Medications: Diuretic: furosemide 80 mg IV BID Beta Blocker: metoprolol succinate 100 mg PO daily SGLT2i: dapagliflozin (Farxiga) 10 mg PO daily  Prior to admission HF Medications: Diuretic: furosemide 80 mg PO  daily (last dispensed 04/14/23 for 30ds) Beta blocker: metoprolol succinate 200 mg PO daily (last dispensed 02/22/23 for 30ds) - pt stated that she was taking 100 mg PO daily at home ACE/ARB/ARNI: losartan 25 mg PO daily (last dispensed 02/22/23 for 30ds) SGLT2i: dapagliflozin (Farxiga) 10  mg PO daily (last dispensed 04/11/23 for 30ds) Other: digoxin 0.25 mg PO daily (last dispensed 02/22/23 for 30ds)   Pt had also been out of rivaroxaban for at least 1 week.   Pertinent Lab Values: Serum creatinine 0.83  (eGFR >60), BUN 18, Potassium 3.5, Sodium 138, BNP 609, Magnesium 1.9, A1c 6.1% March 2024, digoxin <0.2 ng/mL  Vital Signs: Weight: 241 lbs (admission weight: 255.95 lbs) - dry weight 213 lbs Blood pressure: 100s/60-70s Heart rate: 70-90s (AF)  I/O: net -3.3L yesterday; net -7.7L since admission (good documentation on 10/23 and 10/24)  Medication Assistance / Insurance Benefits Check: Does the patient have prescription insurance?  Yes Type of insurance plan: UHC Dual Complete Medicare/Medicaid -$0 copay for medications  Does the patient qualify for medication assistance through manufacturers or grants?   No  Outpatient Pharmacy:  Prior to admission outpatient pharmacy: [facility] [address] [facility] [address] Is the patient willing to use [facility] TOC pharmacy at discharge? Yes Is the patient willing to transition their outpatient pharmacy to utilize a [facility] outpatient pharmacy?   Pending - at this time pt would prefer to continue using [facility] to fill her medications.     Assessment: 1. Acute on chronic systolic CHF (LVEF 30-35%), due to ischemic causes - exacerbation spurred by medication noncompliance. NYHA  class IV symptoms, complicated by severe COPD non-adherent to maintenance inhaler. - Pt remains hypervolemic on exam, bilateral pitting edema still present but improved. Down to 2L O2 [place], improved from 3L PTA. Continues to feel poorly. Pt responding well to IV diuresis (weight down  ~14 lbs). Scr stable. - BP has remained stable today. Pt denies s/sx of hypotension. Agree with holding home ARB, but could consider addition of low-dose MRA prior to discharge which would minimally impact BP, help with diuresis, and reduce morbidity/mortality. Pt unlikely to be adherent to twice daily ARNI. - Would not restart home digoxin as pt is non-compliant and could likely tolerate additional GDMT. HR controlled on metoprolol. K repleted with today. - Pt at high risk of readmission and worsening HF due to medication noncompliance - pt would potentially benefit from mail order pharmacy, but she is hesitant to switch at this time. Will try to set pt up with pill box and 90 day supplies of medication prior to discharge.   Plan: 1) Medication changes recommended at this time: - Continue furosemide 80 mg IV BID - Continue metoprolol succinate to 100 mg PO daily - Continue dapagliflozin (Farxiga) 10 mg PO daily - Consider starting spironolactone 12.5 mg PO daily prior to discharge  2) Patient assistance: - No patient assistance needs noted at this time.  - Team currently planning to keep patient admitted through the weekend - will plan to set her up with a pill box filled with one week's worth of medications prior to discharge.  - Encouraged using TOC pharmacy to fill all maintenance medications, with 90ds when possible.   3)  Education  - Initial education complete including importance of adherence to medications to prevent morbidity, mortality, hospital admission. Discussed that medications will likely be adjusted while she is admitted, and may continue to be adjusted at hospital f/u. Full education to be completed prior to discharge  Nils Pyle, PharmD PGY1 Pharmacy Resident

## 2023-06-04 NOTE — Progress Notes (Addendum)
Subjective:  Patient says that she feels about similar today. She is still slightly short of breath. The patient has no questions today. She is urinating plenty. We discussed current plans and she says that she will accept her suboxone instead of declining it. Aeriana says that she has used a cpap before, but does not like it and does not wear it.  Objective:  Vital signs in last 24 hours: Vitals:   06/03/23 2112 06/04/23 0140 06/04/23 0215 06/04/23 0415  BP:   96/62   Pulse:   (!) 108 63  Resp:   20 18  Temp:   98.6 F (37 C)   TempSrc:   Oral   SpO2: 98%  96% 100%  Weight:  109.6 kg    Height:       Weight change: -4.311 kg  Intake/Output Summary (Last 24 hours) at 06/04/2023 0721 Last data filed at 06/04/2023 0157 Gross per 24 hour  Intake 764.73 ml  Output 4300 ml  Net -3535.27 ml   Physical Exam Constitutional:      General: She is not in acute distress.    Appearance: She is not ill-appearing.  HENT:     Head: Normocephalic.  Cardiovascular:     Rate and Rhythm: Normal rate. Rhythm irregularly irregular.     Pulses: Normal pulses.  Pulmonary:     Effort: Pulmonary effort is normal.     Breath sounds: Wheezing present.  Abdominal:     Tenderness: There is no abdominal tenderness.  Musculoskeletal:     Cervical back: Normal range of motion.     Right lower leg: 1+ Pitting Edema present.     Left lower leg: 1+ Pitting Edema present.  Skin:    Comments: Well healed old, linear scar to the left flank. Slightly blue discoloration of bilateral distal feet. Warm to the touch  Neurological:     General: No focal deficit present.     Mental Status: She is alert. Mental status is at baseline. She is disoriented.  Psychiatric:        Mood and Affect: Mood normal.        Behavior: Behavior normal.     Comments: Patient appears tired and is closing her eyes between talking    Assessment/Plan:  Principal Problem:   Acute exacerbation of CHF (congestive heart  failure) (HCC) Active Problems:   Polysubstance abuse (HCC)   Depression   Atrial fibrillation with rapid ventricular response (HCC)   COPD (chronic obstructive pulmonary disease) (HCC)   Parkinson's disease (HCC)   CAD (coronary artery disease) S/P stent to LAD   Acute hypoxic respiratory failure (HCC)  Autumn [name] is a 67 year old female with past medical hx of PMHx of HFrEF (EF 35%), A fib with RVR, chronic hypoxic respiratory failure, COPD, T2DM, polysubstance use who is admitted for acute on chronic heart failure, acute hypoxic respiratory failure, and afib RVR.   #Acute on Chronic Heart Failure with Reduced Ejection Fraction (LVEF 35%) Admitted with Xray, BNP, and clinical findings consistent with HF exacerbation. Prior echo LVEF 35%, NYHA IV. Dry weight: 96.9 kg at last discharge 02/22/2023. Home diuretics: Lasix 80 mg once daily. 3L at baseline. CXR (8 am) showed Congestive heart failure/pulmonary edema with bilateral small-to-moderate layering effusions, but follow up CT A Chest showed resolution (6 pm). Trigger for decompensation is most likely medication non-compliance. 10/25: Still defer digoxin as heart rate has improved and it appears that she has not been on it since June/July.  Ferritin is 32, low for HFrEF, so we will replete closer to discharge. Iron deficit is 820 mg per ganzoni equation. - Continue IV Lasix 80 mg BID for goal net negative 2L/day - Iron: Give Venofer 200 mg until discharge - GDMT:    *BetaB: Continue 100 mg due to decreased perfusion    *RAAS: Hold home losartan 25 mg today    *Aldo: Won't start due to low BP    *SGLT: Continue home Farxiga 10 mg    *ICD: Address in the outpatient setting (new LVEF 30-35%) - Strict I/Os, standing daily weights - Obtain Ultrasound ABI with vascular  #Longstanding Persistent Atrial fibrillation with RVR Back on home rivaroxaban 20 mg once daily. She was not anticoagulated for one week. Per records it appear that she was  not taking digoxin. Will defer starting it. - Rivaroxaban 20 mg - Metoprolol 100 mg  #Chronic Obstructive Pulmonary Disease [facility] [place]. On Breztri 2 puffs twice a day, Albuterol. PFT's are up to date and on (06/22/2022) showed mild obstruction.  10/25: Declined bipap overnight and O2 sat's are >93% - Prednisone 40 mg - Duoneb q6h scheduled - Breo ellipta and Incruse Ellipta - Incentive spirometry  #History of Hepatitis C Previous history diagnosed by labs on 05/27/2007. Hep C ab positive, PCR negative consistent with prior infection. Patient says that she was treated at a clinic in [facility].  Chronic Problems T2DM: Patient states that she does not have diabetes. A1c was 6.1% on 10/2022.  MDD: continue abilify 5 mg and duloxetine 30 mg HLD: continue atorvastatin 80 mg. LDL was 109 on 11/2022 Arthritis/Chronic pain: Home tylenol, voltaren, gabapentin 600 mg TID Parkinson's: Defer Carbidopa-levodopa. Diagnosed ~08/21/2022; [facility] OUD: Continue home Suboxone 8-2 mg 3 times daily Smoking: Nicoderm 14 mg daily, encourage cessation  Diet: Heart Healthy IVF: None,None VTE:  Xarelto Code: Full PT/OT recs: Home Health, none.   LOS: 4 days   Kayleen Memos, Medical Student 06/04/2023, 7:21 AM

## 2023-06-04 NOTE — Progress Notes (Signed)
Mobility Specialist Progress Note:   06/04/23 1500  Mobility  Activity Refused mobility  Mobility Specialist Start Time (ACUTE ONLY) 1504   Pt refused mobility d/t fatigue. Will f/u as able.   D'Vante Earlene Plater Mobility Specialist Please contact via Special educational needs teacher or Rehab office at [phone]

## 2023-06-04 NOTE — Plan of Care (Signed)
°  Problem: Coping: °Goal: Level of anxiety will decrease °Outcome: Progressing °  °

## 2023-06-04 NOTE — Progress Notes (Signed)
   06/04/23 2031  BiPAP/CPAP/SIPAP  BiPAP/CPAP/SIPAP Pt Type Adult  BiPAP/CPAP/SIPAP SERVO  Reason BIPAP/CPAP not in use Non-compliant (Pt refused. States "im not doing it")  BiPAP/CPAP /SiPAP Vitals  Resp 18  MEWS Score/Color  MEWS Score 1  MEWS Score Color Green

## 2023-06-05 ENCOUNTER — Encounter (HOSPITAL_COMMUNITY): Payer: 59

## 2023-06-05 DIAGNOSIS — I5023 Acute on chronic systolic (congestive) heart failure: Secondary | ICD-10-CM | POA: Diagnosis not present

## 2023-06-05 LAB — BASIC METABOLIC PANEL
Anion gap: 13 (ref 5–15)
BUN: 22 mg/dL (ref 8–23)
CO2: 35 mmol/L — ABNORMAL HIGH (ref 22–32)
Calcium: 9.2 mg/dL (ref 8.9–10.3)
Chloride: 90 mmol/L — ABNORMAL LOW (ref 98–111)
Creatinine, Ser: 0.9 mg/dL (ref 0.44–1.00)
GFR, Estimated: >60 mL/min (ref >60)
Glucose, Bld: 119 mg/dL — ABNORMAL HIGH (ref 70–99)
Potassium: 3.9 mmol/L (ref 3.5–5.1)
Sodium: 138 mmol/L (ref 135–145)

## 2023-06-05 LAB — MAGNESIUM: Magnesium: 2.3 mg/dL (ref 1.7–2.4)

## 2023-06-05 NOTE — Progress Notes (Signed)
Mobility Specialist Progress Note:   06/05/23 1337  Mobility  Activity Ambulated with assistance in hallway  Level of Assistance Contact guard assist, steadying assist  Assistive Device Front wheel walker  Distance Ambulated (ft) 100 ft  Activity Response Tolerated well  Mobility Referral Yes  $Mobility charge 1 Mobility  Mobility Specialist Start Time (ACUTE ONLY) 1200  Mobility Specialist Stop Time (ACUTE ONLY) 1213  Mobility Specialist Time Calculation (min) (ACUTE ONLY) 13 min   Pt received in bed, agreeable to mobility. SB to stand. CG during ambulation. C/o slight leg weakness, otherwise asymptomatic throughout. Ambulated on 2 L. Pt left in chair with call bell in reach and all needs met.  Leory Plowman  Mobility Specialist Please contact via [employer] office at [phone]

## 2023-06-05 NOTE — Progress Notes (Signed)
                 Interval history No new concerns.  Feeling relatively well today.  Denies shortness of breath.  Legs feel less swollen.  Physical exam Blood pressure 119/67, pulse 84, temperature 98.8 F (37.1 C), temperature source Oral, resp. rate 20, height 5\' 5"  (1.651 m), weight 105.3 kg, SpO2 96%.  No distress Heart rate is normal, rhythm is regular, radial pulses strong, cap refill approximately 5 seconds bilateral lower extremities Breathing comfortably on low-flow oxygen via nasal cannula, bibasilar crackles present, scant wheeze Skin warm and dry Alert and oriented Pleasant and congruent affect  Weight change: -4.309 kg   Intake/Output Summary (Last 24 hours) at 06/05/2023 0726 Last data filed at 06/05/2023 0552 Gross per 24 hour  Intake 1450 ml  Output 4800 ml  Net -3350 ml   Net IO Since Admission: -11,074.27 mL [06/05/23 0726]  Labs, images, and other studies Stable electrolytes and kidney function.  Assessment and plan Hospital day 5  Autumn [name] is a 67 y.o. admitted for acute on chronic congestive heart failure and COPD, complicated by atrial fibrillation with RVR.  Diuresing well and improving.  Principal Problem:   Acute exacerbation of CHF (congestive heart failure) (HCC) Active Problems:   Polysubstance abuse (HCC)   Depression   Atrial fibrillation with rapid ventricular response (HCC)   COPD (chronic obstructive pulmonary disease) (HCC)   Parkinson's disease (HCC)   CAD (coronary artery disease) S/P stent to LAD   Acute hypoxic respiratory failure (HCC)  Acute exacerbation of CHF with reduced ejection fraction Stable echo on this admission, EF 35%.  Suspect nonadherence is trigger for decompensation.  She is diuresing quite well, does not need inotropes.  Renal stable, continue course, still volume overloaded and not at estimated dry weight of around 213 pounds.  Adding back GDMT as tolerated, blood pressure has been borderline during this  admission. - Furosemide 80 mg IV twice daily - Empagliflozin 10 mg - Metoprolol succinate 100 mg  Persistent A-fib with RVR Improving with diuresis.  Rates around 100 today.  Metoprolol was decreased on admission for hypotension. - Treat underlying volume overload per above - Metoprolol succinate 100 mg - Xarelto 20 mg daily  COPD Predominantly with wheezing on admission, treated for exacerbation.  Less wheezing today.  Breathing feels better.  No increased sputum, azithromycin was deferred. - Prednisone 40 mg daily - DuoNebs as needed - LAMA LABA ICS daily  Depression With comorbid anxiety, has had some as needed Ativan during this admission.  Exacerbated by hospitalization. 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 - Outpatient Suboxone continued  CAD PCI to LAD in 2020.  Not currently on any antiplatelets. - Continue outpatient statin  History of Parkinson's disease Outpatient Sinemet held for hypotension.  Diet: Heart healthy IVF: N/A VTE: Therapeutic rivaroxaban Code: Full PT/OT recommendations: Home health PT TOC recommendations: Following, appreciate help. Family Update: N/A  Discharge plan: Pending cardiovascular decongestion.  Marrianne Mood MD 06/05/2023, 7:26 AM  Pager: [phone] After 5pm or weekend: [phone]

## 2023-06-05 NOTE — Plan of Care (Signed)

## 2023-06-06 ENCOUNTER — Encounter (HOSPITAL_COMMUNITY): Payer: 59

## 2023-06-06 DIAGNOSIS — I5023 Acute on chronic systolic (congestive) heart failure: Secondary | ICD-10-CM | POA: Diagnosis not present

## 2023-06-06 LAB — BASIC METABOLIC PANEL
Anion gap: 9 (ref 5–15)
BUN: 20 mg/dL (ref 8–23)
CO2: 36 mmol/L — ABNORMAL HIGH (ref 22–32)
Calcium: 8.7 mg/dL — ABNORMAL LOW (ref 8.9–10.3)
Chloride: 93 mmol/L — ABNORMAL LOW (ref 98–111)
Creatinine, Ser: 0.82 mg/dL (ref 0.44–1.00)
GFR, Estimated: >60 mL/min (ref >60)
Glucose, Bld: 81 mg/dL (ref 70–99)
Potassium: 3.5 mmol/L (ref 3.5–5.1)
Sodium: 138 mmol/L (ref 135–145)

## 2023-06-06 LAB — MAGNESIUM: Magnesium: 2.2 mg/dL (ref 1.7–2.4)

## 2023-06-06 MED ORDER — POTASSIUM CHLORIDE CRYS ER 20 MEQ PO TBCR
60.0000 meq | EXTENDED_RELEASE_TABLET | Freq: Once | ORAL | Status: AC
  Administered 2023-06-06: 60 meq via ORAL
  Filled 2023-06-06: qty 3

## 2023-06-06 MED ORDER — LOSARTAN POTASSIUM 25 MG PO TABS
25.0000 mg | ORAL_TABLET | Freq: Every day | ORAL | Status: DC
  Administered 2023-06-07 – 2023-06-09 (×3): 25 mg via ORAL
  Filled 2023-06-06 (×3): qty 1

## 2023-06-06 MED ORDER — ORAL CARE MOUTH RINSE: 15.0000 mL | OROMUCOSAL | Status: DC | PRN

## 2023-06-06 MED ORDER — LORAZEPAM 0.5 MG PO TABS
0.5000 mg | ORAL_TABLET | Freq: Once | ORAL | Status: AC
  Administered 2023-06-06: 0.5 mg via ORAL
  Filled 2023-06-06: qty 1

## 2023-06-06 NOTE — Progress Notes (Addendum)
Hospital Day 6  Subjective:  Patient says that her breathing is better, but that she had a rough night from anxiety. The ativan she received helped her, and she was happy to have some mango [place] ice. Autumn [name] says that she does not have an O2 sat monitor at home. Upon being asked, the patient states that her legs regularly ache when she walks. She says that she used to be on aspirin, but no longer is, and also does not recall clopidogrel/plavix.  Objective:  Vital signs in last 24 hours: Vitals:   06/05/23 2007 06/05/23 2020 06/06/23 0324 06/06/23 0542  BP:  91/73 98/62   Pulse: 66  78   Resp: 19 17 17    Temp: 99.6 F (37.6 C) 98.3 F (36.8 C) 99.6 F (37.6 C)   TempSrc: Oral Oral Oral   SpO2: 96% 97% 98%   Weight:    103.5 kg  Height:       Weight change: -1.769 kg  Intake/Output Summary (Last 24 hours) at 06/06/2023 0981 Last data filed at 06/05/2023 1955 Gross per 24 hour  Intake 720 ml  Output 2900 ml  Net -2180 ml   Physical Exam Constitutional:      General: She is not in acute distress.    Appearance: She is not ill-appearing, toxic-appearing or diaphoretic.  HENT:     Head: Normocephalic.     Ears:     Comments: Hearing is grossly normal to conversation    Nose: Nose normal.  Eyes:     General: No scleral icterus. Cardiovascular:     Rate and Rhythm: Normal rate. Rhythm irregularly irregular.  Pulmonary:     Effort: Pulmonary effort is normal.     Breath sounds: Normal breath sounds. No wheezing or rales.     Comments: Prolonged expiration phase. On room air, no shortness of breath Musculoskeletal:        General: Normal range of motion.     Cervical back: Normal range of motion.     Right lower leg: 1+ Pitting Edema present.     Left lower leg: 1+ Pitting Edema present.  Skin:    Capillary Refill: Capillary refill takes 2 to 3 seconds.     Comments: Both feet appear blue and are colder than the calves to touch. Hands are also colder to the touch as  compared to more proximal arms. Old, well healed linear scar to left flank. Not wearing socks.  Neurological:     Mental Status: She is alert and oriented to person, place, and time. Mental status is at baseline.  Psychiatric:        Mood and Affect: Mood normal.        Behavior: Behavior normal.     Assessment/Plan:  Principal Problem:   Acute exacerbation of CHF (congestive heart failure) (HCC) Active Problems:   Polysubstance abuse (HCC)   Depression   Atrial fibrillation with rapid ventricular response (HCC)   COPD (chronic obstructive pulmonary disease) (HCC)   Parkinson's disease (HCC)   CAD (coronary artery disease) S/P stent to LAD   Acute hypoxic respiratory failure (HCC)  Autumn [name] is a 67 year old female with past medical hx of PMHx of HFrEF (EF 35%), A fib with RVR, chronic hypoxic respiratory failure, COPD, T2DM, polysubstance use who is admitted for acute on chronic heart failure, acute hypoxic respiratory failure, and afib RVR.   #Acute on Chronic Heart Failure with Reduced Ejection Fraction (LVEF 30-35%) Dry weight: 96.9 kg at  last discharge 02/22/2023. Currently 103.5 kg. Holding home losartan, home metoprolol dose is halved while in hospital due to hypotension and poor perfusion, considering Spiro/Eplerenone for discharge. Will continue diuresing.  - IV Lasix 80 mg BID for goal net negative 2L/day - GDMT:    *BetaB: Continue Metoprolol 100 mg     *RAAS: Hold home losartan 25 mg    *Aldo: Won't start due to low BP    *SGLT: Continue home Farxiga 10 mg - Strict I/Os, standing daily weights  #Longstanding Persistent Atrial fibrillation with RVR Back on home rivaroxaban 20 mg once daily.  Per records it appears that she was not taking digoxin. Will defer starting it. - Rivaroxaban 20 mg  #Chronic Obstructive Pulmonary Disease [facility] [place]. On Breztri 2 puffs twice a day, Albuterol prn. PFT's are up to  date and on (06/22/2022) showed mild obstruction. - Duonebs PRN - Breo ellipta and Incruse Ellipta for LAMA LABA ICS  Chronic Problems MDD: continue abilify 5 mg and duloxetine 30 mg HLD: continue atorvastatin 80 mg. LDL was 109 on 11/2022 Arthritis/Chronic pain: Home tylenol, voltaren, gabapentin 600 mg TID Parkinson's: Diagnosed ~08/21/2022; [facility] OUD: Continue home Suboxone 8-2 mg 3 times daily Smoking: Nicoderm 14 mg daily, encourage cessation  Diet: Heart Healthy IVF: None,None VTE:  Xarelto Code: Full PT/OT recs: Home Health, none.  Discharge plan: 1-2 days to home   LOS: 6 days   Kayleen Memos, Medical Student 06/06/2023, 6:39 AM

## 2023-06-06 NOTE — Plan of Care (Signed)

## 2023-06-07 ENCOUNTER — Encounter (HOSPITAL_COMMUNITY): Payer: 59

## 2023-06-07 DIAGNOSIS — I5023 Acute on chronic systolic (congestive) heart failure: Secondary | ICD-10-CM | POA: Diagnosis not present

## 2023-06-07 LAB — BASIC METABOLIC PANEL
Anion gap: 6 (ref 5–15)
BUN: 23 mg/dL (ref 8–23)
CO2: 32 mmol/L (ref 22–32)
Calcium: 8.4 mg/dL — ABNORMAL LOW (ref 8.9–10.3)
Chloride: 94 mmol/L — ABNORMAL LOW (ref 98–111)
Creatinine, Ser: 0.89 mg/dL (ref 0.44–1.00)
GFR, Estimated: >60 mL/min (ref >60)
Glucose, Bld: 110 mg/dL — ABNORMAL HIGH (ref 70–99)
Potassium: 3.5 mmol/L (ref 3.5–5.1)
Sodium: 132 mmol/L — ABNORMAL LOW (ref 135–145)

## 2023-06-07 MED ORDER — POTASSIUM CHLORIDE CRYS ER 20 MEQ PO TBCR
60.0000 meq | EXTENDED_RELEASE_TABLET | Freq: Once | ORAL | Status: AC
  Administered 2023-06-07: 60 meq via ORAL
  Filled 2023-06-07: qty 3

## 2023-06-07 MED ORDER — FUROSEMIDE 10 MG/ML IJ SOLN: 80.0000 mg | Freq: Once | INTRAMUSCULAR | Status: DC

## 2023-06-07 MED ORDER — FUROSEMIDE 10 MG/ML IJ SOLN
80.0000 mg | Freq: Two times a day (BID) | INTRAMUSCULAR | Status: DC
  Administered 2023-06-07 – 2023-06-09 (×4): 80 mg via INTRAVENOUS
  Filled 2023-06-07 (×4): qty 8

## 2023-06-07 NOTE — Care Management Important Message (Signed)
Important Message  Patient Details  Name: Autumn [name] MRN: [id number] Date of Birth: 1955-09-28   Important Message Given:  Yes - Medicare IM     Sherilyn Banker 06/07/2023, 4:32 PM

## 2023-06-07 NOTE — Progress Notes (Signed)
PT Cancellation Note  Patient Details Name: IRBY DUMAS MRN: [id number] DOB: 1956-03-06   Cancelled Treatment:    Reason Eval/Treat Not Completed: Patient declined, no reason specified - pt states she is waiting on an [place] of [place] and does not want to do PT while she waits. PT to check back as able.   Marye Round, PT DPT Acute Rehabilitation Services Secure Chat Preferred  Office [phone]    Truddie Coco 06/07/2023, 3:34 PM

## 2023-06-07 NOTE — Progress Notes (Signed)
Heart Failure Stewardship Pharmacist Progress Note   PCP: Autumn [name], Autumn [name], Autumn [name] PCP-Cardiologist: Thomasene Ripple, DO    HPI:  [name] with PMH of CHF, polysubstance abuse on suboxone, HTN, Afib with RVR, COPD, parkinsons, PAD, CAD s/p PCI to LAD (2020). Frequent admissions for CHF exacerbations.  Initially presenting to ED on 10/21 via EMS with SOB x 2-3 days, wheezing, non-adherence to fluid pills and DOAC x1 week. Placed on BiPAP, on 3L [place] at home. Labs demonstrated respiratory acidosis with pH 7.2, pCO2 71, pO2 195. CXR 10/21 showed CHF/pulmonary edema with bilateral small-to-mod layering effusions. BNP elevated. CTA 10/21 without evidence of PE. Echo 10/22 with LVEF 30-35% (prev 35-40% in Jun 2024), global hypokinesis, moderately dilated, RV normal, mod-severe mitral regurgitation. Pt has had cool extremities throughout admission. Obtained ABIs ordered 10/28.   Pt is no longer needing BiPAP post-IV diuresis. However, pt reports that she does not feel any better- same as she did at admission. Denies dizziness/lightheadedness going from laying to sitting, but states that she has not moved around/stood up yet. Pt is sitting in chair upon interview today. Reports that she ran out of her medications ~1 week prior to admission - was trying to transfer them from [facility] to her usual [employer].   States that she organizes her medications at home. Was previously using [name] pill box (with one slot/day) until she ran out of lasix about one week ago, then transitioned to taking pills out of the bottles. States that her family (who she lives with) helps her pick up the medications from [facility]. Discussed whether she would be interested in mail order - but pt is unsure. Expresses some concern about who will be able to pick up her medications after discharge.   Current HF Medications: Diuretic: furosemide 80 mg IV BID Beta Blocker: metoprolol succinate 100 mg PO daily ACE/ARB/ARNI: losartan 25 mg PO  daily SGLT2i: dapagliflozin (Farxiga) 10 mg PO daily  Prior to admission HF Medications: Diuretic: furosemide 80 mg PO daily (last dispensed 04/14/23 for 30ds) Beta blocker: metoprolol succinate 200 mg PO daily (last dispensed 02/22/23 for 30ds) - pt stated that she was taking 100 mg PO daily at home ACE/ARB/ARNI: losartan 25 mg PO daily (last dispensed 02/22/23 for 30ds) SGLT2i: dapagliflozin (Farxiga) 10  mg PO daily (last dispensed 04/11/23 for 30ds) Other: digoxin 0.25 mg PO daily (last dispensed 02/22/23 for 30ds)   Pt had also been out of rivaroxaban for at least 1 week prior to admission.  Pertinent Lab Values: Serum creatinine 0.89  (eGFR >60), BUN 23, Potassium 3.5, Sodium 132, BNP 609, Magnesium 2.2, A1c 6.1% March 2024, digoxin <0.2 ng/mL (on admission)  Vital Signs: Weight: 228 lbs (admission weight: 255.95 lbs) - dry weight 213 lbs Blood pressure: 90-120s/60-80s Heart rate: 90-100s (AF)  I/O: net -3.1 L yesterday; net -16.3 L since admission O2 flow rate 1 L [place] > 2 L [place] > Room air   Medication Assistance / Insurance Benefits Check: Does the patient have prescription insurance?  Yes Type of insurance plan: UHC Dual Complete Medicare/Medicaid -$0 copay for medications  Does the patient qualify for medication assistance through manufacturers or grants?   No  Outpatient Pharmacy:  Prior to admission outpatient pharmacy: [facility] [address] [facility] [employer] Is the patient willing to use [facility] TOC pharmacy at discharge? Yes Is the patient willing to transition their outpatient pharmacy to utilize [name] outpatient pharmacy?   Pending - at this time pt would prefer  to continue using [facility] to fill her medications.     Assessment: 1. Acute on chronic systolic CHF (LVEF 30-35%), due to ischemic causes - exacerbation spurred by medication noncompliance. NYHA class IV symptoms, complicated by severe COPD non-adherent to maintenance inhaler. - Pt remains hypervolemic on exam  with bilateral pitting edema, though much improved. Breathing on RA (96% O2 sat) upon interview today and while walking with mobility specialists. Breathing improved per pt. Continues to respond well to IV diuresis (weight down ~27 lbs). Scr stable.  - BP has remained stable, agree with re-initiation of home ARB. Pt did report some dizziness upon standing today, but not persistent. Should consider addition of low-dose MRA prior to discharge, which would minimally impact BP, help with diuresis, and reduce morbidity/mortality. Pt unlikely to be adherent to twice daily ARNI. - Would not restart home digoxin as pt is non-compliant and could likely tolerate additional GDMT. HR controlled on metoprolol.  - Pt at high risk of readmission and worsening HF due to medication noncompliance - pt would potentially benefit from mail order pharmacy, but she is hesitant to switch at this time. Will try to set pt up with pill box and 90 day supplies of medication prior to discharge.  - Potassium consistently < 4 mEq/L, would recommend repletion with today, considering ARB was also added.    Plan: 1) Medication changes recommended at this time: - Continue furosemide 80 mg IV BID.  - Continue metoprolol succinate to 100 mg PO daily - Continue dapagliflozin (Farxiga) 10 mg PO daily - Continue losartan 25 mg PO daily - Consider starting spironolactone 12.5 mg PO daily prior to discharge  2) Patient assistance: - No patient assistance needs noted at this time.  - Team currently planning to keep patient admitted 1-2 more days - will plan to set her up with [name] pill box filled with one week's worth of medications prior to discharge.  - Encouraged using TOC pharmacy to fill all maintenance medications, with 90ds when possible.   3)  Education  - Education complete including importance of adherence to medications to prevent morbidity, mortality, hospital admission. Discussed that medications will likely be adjusted  while she is admitted, and may continue to be adjusted at hospital f/u. Pt aware of impact of lifestyle and medication non-adherence on risk of HF exacerbations.   Nils Pyle, PharmD PGY1 Pharmacy Resident

## 2023-06-07 NOTE — Progress Notes (Signed)
Mobility Specialist Progress Note:   06/07/23 1200  Oxygen Therapy  O2 Device Room Air  Mobility  Activity Ambulated with assistance in hallway  Level of Assistance Contact guard assist, steadying assist  Assistive Device None  Distance Ambulated (ft) 300 ft  Activity Response Tolerated well  Mobility Referral Yes  $Mobility charge 1 Mobility  Mobility Specialist Start Time (ACUTE ONLY) 1127  Mobility Specialist Stop Time (ACUTE ONLY) 1143  Mobility Specialist Time Calculation (min) (ACUTE ONLY) 16 min    Received pt on EOB having no complaints and agreeable to mobility. Ambulated on RA with SpO2 levels in high 90s. Pt was asymptomatic throughout ambulation and returned to room w/o fault. Left in chair w/ call bell in reach and all needs met.   D'Vante Earlene Plater Mobility Specialist Please contact via Special educational needs teacher or Rehab office at [phone]

## 2023-06-07 NOTE — TOC Progression Note (Signed)
Transition of Care [facility]) - Progression Note    Patient Details  Name: Autumn [name] MRN: [id number] Date of Birth: [birth year]  Transition [facility]) CM/SW Contact  Ronny Bacon, RN Phone Number: 06/07/2023, 2:48 PM  Clinical Narrative:  Secure message received from therapy regarding patient concern that her oxygen tanks at home being empty and having a valve issue. Call to [facility] with [employer] to inform of same, will put in service order for patient tanks to be switched out. Patient reports that someone is always home.      Expected Discharge Plan: Home w Home Health Services Barriers to Discharge: Continued Medical Work up  Expected Discharge Plan and Services   Discharge Planning Services: CM Consult Post Acute Care Choice: Home Health Living arrangements for the past 2 months: Single Family Home                 DME Arranged: N/A DME Agency: NA       HH Arranged: PT HH AgencyHotel manager Home Health Care Date [facility] Agency Contacted: 06/03/23 Time HH Agency Contacted: 1158 Representative spoke with at [facility] Agency: Lorenza Chick   Social Determinants of Health (SDOH) Interventions SDOH Screenings   Food Insecurity: Patient Declined (05/31/2023)  Housing: Patient Declined (06/02/2023)  Transportation Needs: No Transportation Needs (06/02/2023)  Recent Concern: Transportation Needs - Unmet Transportation Needs (05/17/2023)   Received from [facility]  Utilities: Patient Declined (05/31/2023)  Alcohol Screen: Low Risk  (06/02/2023)  Financial Resource Strain: Low Risk  (06/02/2023)  Physical Activity: Unknown (05/17/2023)   Received from [facility]  Social Connections: Socially Integrated (05/17/2023)   Received from [facility]  Stress: No Stress Concern Present (05/17/2023)   Received from [facility]  Tobacco Use: High Risk (05/31/2023)    Readmission Risk Interventions    01/21/2023    1:29 PM 12/07/2022    1:27 PM  Readmission Risk Prevention Plan   Transportation Screening Complete Complete  PCP or Specialist Appt within 3-5 Days  Complete  HRI or Home Care Consult  Complete  Social Work Consult for Recovery Care Planning/Counseling  Complete  Palliative Care Screening  Not Applicable  Medication Review Oceanographer) Complete Complete  PCP or Specialist appointment within 3-5 days of discharge Complete   HRI or Home Care Consult Complete   SW Recovery Care/Counseling Consult Complete   Palliative Care Screening Not Applicable   Skilled Nursing Facility Not Applicable

## 2023-06-07 NOTE — Progress Notes (Signed)
Occupational Therapy Treatment Patient Details Name: Autumn [name] MRN: [id number] DOB: 01-02-1956 Today's Date: 06/07/2023   History of present illness Pt is a 67 year old female admitted 10/21  presenting to the ED with worsening shortness of breath and found to be in CHF with episodes of VTACH.  PMHx  HFrEF (EF 35%), A fib with RVR, chronic hypoxic respiratory failure, T2DM, polysubstance use   OT comments  Pt making steady progress towards OT goals. Focused session on tub shower transfers and options to maximize ease/safety of these transfers w/ no physical assistance needed. Reinforced energy conservation w/ pt verbalizing understanding. Pt expressed concerns of getting portable tanks filled at home to allow safe community activities - will notify CM.       If plan is discharge home, recommend the following:  Assist for transportation;A little help with bathing/dressing/bathroom   Equipment Recommendations  None recommended by OT    Recommendations for Other Services      Precautions / Restrictions Precautions Precautions: Fall;Other (comment) Precaution Comments: monitor O2 Restrictions Weight Bearing Restrictions: No       Mobility Bed Mobility               General bed mobility comments: EOB on entry    Transfers Overall transfer level: Independent Equipment used: None Transfers: Sit to/from Stand Sit to Stand: Independent                 Balance Overall balance assessment: Needs assistance Sitting-balance support: Feet supported Sitting balance-Leahy Scale: Good     Standing balance support: During functional activity, No upper extremity supported, Single extremity supported Standing balance-Leahy Scale: Fair                             ADL either performed or assessed with clinical judgement   ADL Overall ADL's : Needs assistance/impaired                                 Tub/ Shower Transfer:  Supervision/safety;Shower Field seismologist Details (indicate cue type and reason): simulated tub height to step over with shower chair; pt uses armrests of shower chair for support. Pt reports having a tub bench she could try, also educated on grab bars that secure around tub edge w/ pt planning to look into buying it with insurance allotments        Extremity/Trunk Assessment Upper Extremity Assessment Upper Extremity Assessment: Overall WFL for tasks assessed   Lower Extremity Assessment Lower Extremity Assessment: Defer to PT evaluation        Vision   Vision Assessment?: Wears glasses for reading   Perception     Praxis      Cognition Arousal: Alert Behavior During Therapy: WFL for tasks assessed/performed, Impulsive Overall Cognitive Status: Within Functional Limits for tasks assessed                                          Exercises      Shoulder Instructions       General Comments SpO2 WFL on 3 L O2    Pertinent Vitals/ Pain       Pain Assessment Pain Assessment: No/denies pain  Home Living  Prior Functioning/Environment              Frequency  Min 1X/week        Progress Toward Goals  OT Goals(current goals can now be found in the care plan section)  Progress towards OT goals: Progressing toward goals  Acute Rehab OT Goals Patient Stated Goal: home soon OT Goal Formulation: With patient Time For Goal Achievement: 06/15/23 Potential to Achieve Goals: Good ADL Goals Pt Will Perform Grooming: with modified independence;standing Pt Will Perform Lower Body Bathing: with modified independence;sit to/from stand Pt Will Perform Lower Body Dressing: with modified independence;sit to/from stand Pt Will Transfer to Toilet: with modified independence;ambulating Pt Will Perform Toileting - Clothing Manipulation and hygiene: sit to/from stand;with modified  independence Pt Will Perform Tub/Shower Transfer: Tub transfer;ambulating;shower seat;tub bench;with modified independence Additional ADL Goal #1: Pt will independently verbalize at least two energy conservation strategies for selfcare tasks.  Plan      Co-evaluation                 AM-PAC OT "6 Clicks" Daily Activity     Outcome Measure   Help from another person eating meals?: None Help from another person taking care of personal grooming?: None Help from another person toileting, which includes using toliet, bedpan, or urinal?: A Little Help from another person bathing (including washing, rinsing, drying)?: A Little Help from another person to put on and taking off regular upper body clothing?: None Help from another person to put on and taking off regular lower body clothing?: None 6 Click Score: 22    End of Session Equipment Utilized During Treatment: Oxygen  OT Visit Diagnosis: Unsteadiness on feet (R26.81);Muscle weakness (generalized) (M62.81)   Activity Tolerance Patient tolerated treatment well   Patient Left in bed;with call bell/phone within reach   Nurse Communication Mobility status        Time: 1414-1430 OT Time Calculation (min): 16 min  Charges: OT General Charges $OT Visit: 1 Visit OT Treatments $Self Care/Home Management : 8-22 mins  Bradd Canary, OTR/L Acute Rehab Services Office: [phone]   Lorre Munroe 06/07/2023, 2:39 PM

## 2023-06-07 NOTE — Progress Notes (Signed)
Hospital Day 7 Subjective:  Autumn [name] is doing well, but felt slightly short of breath this morning and asked for oxygen. She is resting comfortably at bedside this morning and says that she is committed to getting better and following up in the outpatient setting upon discharge.  Objective:  Vital signs in last 24 hours: Vitals:   06/06/23 2300 06/07/23 0348 06/07/23 0410 06/07/23 0521  BP: 113/84   110/64  Pulse: 75  85 75  Resp: 16 (!) 22 20 20   Temp: 98.7 F (37.1 C)   99.4 F (37.4 C)  TempSrc: Oral   Oral  SpO2: 92% 93% 94% 96%  Weight:    103.5 kg  Height:       Weight change: -0.011 kg  Intake/Output Summary (Last 24 hours) at 06/07/2023 4098 Last data filed at 06/06/2023 2200 Gross per 24 hour  Intake 360 ml  Output 3500 ml  Net -3140 ml   Physical Exam Constitutional:      General: She is not in acute distress.    Appearance: She is not ill-appearing, toxic-appearing or diaphoretic.  HENT:     Head: Normocephalic.     Ears:     Comments: Hearing is grossly normal to conversation    Nose: Nose normal.  Eyes:     General: No scleral icterus. Cardiovascular:     Rate and Rhythm: Normal rate. Rhythm irregularly irregular.  Pulmonary:     Effort: Pulmonary effort is normal.     Breath sounds: Normal breath sounds. No wheezing or rales.     Comments: Prolonged expiration phase. On room air, no shortness of breath Musculoskeletal:        General: Normal range of motion.     Cervical back: Normal range of motion.     Right lower leg: 1+ Pitting Edema present.     Left lower leg: 1+ Pitting Edema present.  Skin:    Capillary Refill: Capillary refill takes 2 to 3 seconds.     Comments: Both feet appear slightly red and are colder than the calves to touch. Hands are warm to touch. Old, well healed linear scar to left flank. Wearing socks.  Neurological:     Mental Status: She is alert and oriented to person, place, and time. Mental status is at baseline.   Psychiatric:        Mood and Affect: Mood normal.        Behavior: Behavior normal.     Assessment/Plan:  Principal Problem:   Acute exacerbation of CHF (congestive heart failure) (HCC) Active Problems:   Polysubstance abuse (HCC)   Depression   Atrial fibrillation with rapid ventricular response (HCC)   COPD (chronic obstructive pulmonary disease) (HCC)   Parkinson's disease (HCC)   CAD (coronary artery disease) S/P stent to LAD   Acute hypoxic respiratory failure (HCC)  Autumn [name] is a 67 year old female with past medical hx of PMHx of HFrEF (EF 35%), A fib with RVR, chronic hypoxic respiratory failure, COPD, T2DM, polysubstance use who is admitted for acute on chronic heart failure, acute hypoxic respiratory failure, and afib RVR.   #Acute on Chronic Heart Failure with Reduced Ejection Fraction (LVEF 30-35%) Dry weight: 96.9 kg at last discharge 02/22/2023. Restarting home losartan, home metoprolol dose is halved while in hospital due to hypotension and poor perfusion, considering Spiro/Eplerenone for discharge. Will continue diuresing. Distal extremities are cold and she confirms claudication symptoms. Awaiting vascular ABI results, will defer aspirin as patient is on  xarelto at this time. Morning weight, I/O's, and BMP results are pending. - IV Lasix 80 mg BID for goal net negative 2L/day - GDMT:    *BetaB: Continue Metoprolol 100 mg     *RAAS: Restart home losartan 25 mg    *Aldo: Won't start due to low BP    *SGLT: Continue home Farxiga 10 mg - Strict I/Os, standing daily weights  #Longstanding Persistent Atrial fibrillation with RVR Back on home rivaroxaban 20 mg once daily.  Per records it appears that she was not taking digoxin. Will defer starting it. - Rivaroxaban 20 mg  #Chronic Obstructive Pulmonary Disease [facility] [place]. On Breztri 2 puffs twice a day, Albuterol prn. PFT's are up to date and on  (06/22/2022) showed mild obstruction. - Duonebs PRN - Breo ellipta and Incruse Ellipta for LAMA LABA ICS  Chronic Problems MDD: continue abilify 5 mg and duloxetine 30 mg HLD: continue atorvastatin 80 mg. LDL was 109 on 11/2022 Arthritis/Chronic pain: Home tylenol, voltaren, gabapentin 600 mg TID Parkinson's: Diagnosed ~08/21/2022; [facility] OUD: Continue home Suboxone 8-2 mg 3 times daily Smoking: Nicoderm 14 mg daily, encourage cessation  Diet: Heart Healthy IVF: None,None VTE:  Xarelto Code: Full PT/OT recs: Home Health, none.  Discharge plan: 1-2 days to home   LOS: 7 days   Kayleen Memos, Medical Student 06/07/2023, 6:52 AM

## 2023-06-08 ENCOUNTER — Other Ambulatory Visit (HOSPITAL_COMMUNITY): Payer: Self-pay

## 2023-06-08 ENCOUNTER — Inpatient Hospital Stay (HOSPITAL_COMMUNITY): Payer: 59

## 2023-06-08 DIAGNOSIS — I5023 Acute on chronic systolic (congestive) heart failure: Secondary | ICD-10-CM | POA: Diagnosis not present

## 2023-06-08 LAB — BASIC METABOLIC PANEL
Anion gap: 8 (ref 5–15)
BUN: 25 mg/dL — ABNORMAL HIGH (ref 8–23)
CO2: 31 mmol/L (ref 22–32)
Calcium: 8.7 mg/dL — ABNORMAL LOW (ref 8.9–10.3)
Chloride: 96 mmol/L — ABNORMAL LOW (ref 98–111)
Creatinine, Ser: 0.95 mg/dL (ref 0.44–1.00)
GFR, Estimated: >60 mL/min (ref >60)
Glucose, Bld: 98 mg/dL (ref 70–99)
Potassium: 4 mmol/L (ref 3.5–5.1)
Sodium: 135 mmol/L (ref 135–145)

## 2023-06-08 LAB — MAGNESIUM: Magnesium: 2.1 mg/dL (ref 1.7–2.4)

## 2023-06-08 NOTE — TOC Benefit Eligibility Note (Signed)
Patient Product/process development scientist completed.    The patient is insured through [facility]. Patient has Medicare and is not eligible for a copay card, but may be able to apply for patient assistance, if available.    Ran test claim for Breztri 160-9-4.8 mcg and the current 30 day co-pay is $0.00.   This test claim was processed through [facility]- copay amounts may vary at other pharmacies due to pharmacy/plan contracts, or as the patient moves through the different stages of their insurance plan.     Roland Earl, CPHT Pharmacy Technician III Certified Patient Advocate [facility] Patient Advocate Team Direct Number: [phone]  Fax: [phone]

## 2023-06-08 NOTE — Progress Notes (Signed)
ABI exam has been completed.   Results can be found under chart review under CV PROC. 06/08/2023 1:14 PM Misty Rago RVT, RDMS

## 2023-06-08 NOTE — Plan of Care (Signed)
  Problem: Education: Goal: Knowledge of General Education information will improve Description: Including pain rating scale, medication(s)/side effects and non-pharmacologic comfort measures Outcome: Progressing   Problem: Clinical Measurements: Goal: Ability to maintain clinical measurements within normal limits will improve Outcome: Progressing   Problem: Clinical Measurements: Goal: Respiratory complications will improve Outcome: Progressing   Problem: Activity: Goal: Risk for activity intolerance will decrease Outcome: Progressing   Problem: Skin Integrity: Goal: Risk for impaired skin integrity will decrease Outcome: Progressing   Reinforce education regarding disease process, pain management and medications. Monitor labs and vital signs. Monitor for improving activity tolerance and less shortness of breath with activity. Monitor for healing skin impairments (bruises/skin tears). Continue current plan of care.

## 2023-06-08 NOTE — Progress Notes (Signed)
Physical Therapy Treatment Patient Details Name: ROSILAND YOUNGSTROM MRN: [id number] DOB: 1955-12-03 Today's Date: 06/08/2023   History of Present Illness Pt is a 67 year old female admitted 10/21  presenting to the ED with worsening shortness of breath and found to be in CHF with episodes of VTACH.  PMHx  HFrEF (EF 35%), A fib with RVR, chronic hypoxic respiratory failure, T2DM, polysubstance use    PT Comments  Pt tolerated treatment well without the need of supplemental oxygen. Pt was able to ambulate household distances with the use of a cane. During stair training, the pt needed a break following descending a flight of steps due to fatigue. The pt O2 level on room air stayed above 93 throughout gait and stair training. The pt will continue to benefit from skilled PT to address remaining functional deficits.   If plan is discharge home, recommend the following: A little help with walking and/or transfers;Assistance with cooking/housework;Assist for transportation;Help with stairs or ramp for entrance   Can travel by private vehicle        Equipment Recommendations  None recommended by PT    Recommendations for Other Services       Precautions / Restrictions Precautions Precautions: Fall;Other (comment) Precaution Comments: monitor O2 Restrictions Weight Bearing Restrictions: No     Mobility  Bed Mobility Overal bed mobility: Needs Assistance (Used bedrails) Bed Mobility: Supine to Sit     Supine to sit: Supervision          Transfers Overall transfer level: Modified independent Equipment used: Straight cane Transfers: Sit to/from Stand Sit to Stand: Modified independent (Device/Increase time)                Ambulation/Gait Ambulation/Gait assistance: Supervision Gait Distance (Feet): 300 Feet Assistive device: Straight cane Gait Pattern/deviations: Step-through pattern, Decreased stride length Gait velocity: slow Gait velocity interpretation: <1.8 ft/sec,  indicate of risk for recurrent falls   General Gait Details: Pt did well w/o any LOB and the pt O2 sats stayed above 93 on room air   Stairs Stairs: Yes Stairs assistance: Supervision Stair Management: Step to pattern, Forwards, With cane, One rail Left Number of Stairs: 12 General stair comments: Pt needed to take a break after step 12 due to fatigue   Wheelchair Mobility     Tilt Bed    Modified Rankin (Stroke Patients Only)       Balance Overall balance assessment: Needs assistance Sitting-balance support: Feet supported Sitting balance-Leahy Scale: Good     Standing balance support: Single extremity supported, During functional activity Standing balance-Leahy Scale: Fair Standing balance comment: relied on cane for support                            Cognition Arousal: Alert Behavior During Therapy: WFL for tasks assessed/performed Overall Cognitive Status: Within Functional Limits for tasks assessed                                          Exercises      General Comments General comments (skin integrity, edema, etc.): SpO2 WFL on room air      Pertinent Vitals/Pain Pain Assessment Pain Assessment: No/denies pain    Home Living  Prior Function            PT Goals (current goals can now be found in the care plan section) Acute Rehab PT Goals Patient Stated Goal: to go home PT Goal Formulation: With patient Time For Goal Achievement: 06/15/23 Potential to Achieve Goals: Good Progress towards PT goals: Progressing toward goals    Frequency    Min 1X/week      PT Plan      Co-evaluation              AM-PAC PT "6 Clicks" Mobility   Outcome Measure  Help needed turning from your back to your side while in a flat bed without using bedrails?: None Help needed moving from lying on your back to sitting on the side of a flat bed without using bedrails?: A Little Help needed  moving to and from a bed to a chair (including a wheelchair)?: A Little Help needed standing up from a chair using your arms (e.g., wheelchair or bedside chair)?: A Little Help needed to walk in hospital room?: A Little Help needed climbing 3-5 steps with a railing? : A Little 6 Click Score: 19    End of Session Equipment Utilized During Treatment: Gait belt Activity Tolerance: Patient tolerated treatment well Patient left: in bed;with call bell/phone within reach Nurse Communication: Mobility status PT Visit Diagnosis: Muscle weakness (generalized) (M62.81)     Time: 9147-8295 PT Time Calculation (min) (ACUTE ONLY): 17 min  Charges:    $Gait Training: 8-22 mins PT General Charges $$ ACUTE PT VISIT: 1 Visit                    Caryl Comes, SPT Acute Rehabilitation Office Phone [phone]    Caryl Comes 06/08/2023, 11:24 AM

## 2023-06-08 NOTE — Plan of Care (Signed)

## 2023-06-08 NOTE — Progress Notes (Signed)
Hospital Day 8 Subjective:  Autumn [name] feels much improved and has not felt the need for oxygen over the past day. She says that she was able to walk down the hall without shortness of breath or O2 sat decrease and is very pleased with how much her exertional ability has improved. The patient inquires about an O2 sat finger monitor as well as ensuring her medications for discharge and a follow up appointment with IM upon discharge. She has no complaints at this time.  Objective:  Vital signs in last 24 hours: Vitals:   06/07/23 1205 06/07/23 1246 06/07/23 2100 06/08/23 0430  BP:   (!) 90/57 94/64  Pulse:  73  100  Resp: 16 13    Temp:   98.4 F (36.9 C) 98.6 F (37 C)  TempSrc:   Oral Oral  SpO2:  96%  95%  Weight:    99 kg  Height:       Weight change: -4.5 kg  Intake/Output Summary (Last 24 hours) at 06/08/2023 0645 Last data filed at 06/08/2023 0500 Gross per 24 hour  Intake --  Output 2400 ml  Net -2400 ml   Physical Exam Constitutional:      General: She is not in acute distress.    Appearance: She is not ill-appearing, toxic-appearing or diaphoretic.  HENT:     Head: Normocephalic.     Ears:     Comments: Hearing is grossly normal to conversation    Nose: Nose normal.  Eyes:     General: No scleral icterus. Cardiovascular:     Rate and Rhythm: Normal rate. Rhythm irregularly irregular.  Pulmonary:     Effort: Pulmonary effort is normal.     Breath sounds: Normal breath sounds. No wheezing, rhonchi or rales.     Comments: On room air, no shortness of breath. Conversational without dyspnea or coughing Chest:     Chest wall: No tenderness.  Musculoskeletal:        General: Normal range of motion.     Cervical back: Normal range of motion.     Right lower leg: 1+ Pitting Edema present.     Left lower leg: 1+ Pitting Edema present.  Skin:    Capillary Refill: Capillary refill takes 2 to 3 seconds.     Comments: Both feet feel only slightly colder than the  calves to touch. Hands are warm to touch. Old, well healed linear scar to left flank. Wearing socks.  Neurological:     Mental Status: She is alert and oriented to person, place, and time. Mental status is at baseline.  Psychiatric:        Mood and Affect: Mood normal.        Behavior: Behavior normal.     Assessment/Plan:  Principal Problem:   Acute exacerbation of CHF (congestive heart failure) (HCC) Active Problems:   Polysubstance abuse (HCC)   Depression   Atrial fibrillation with rapid ventricular response (HCC)   COPD (chronic obstructive pulmonary disease) (HCC)   Parkinson's disease (HCC)   CAD (coronary artery disease) S/P stent to LAD   Acute hypoxic respiratory failure (HCC)  Autumn [name] is a 67 year old female with PMHx of HFrEF (EF 35%), A fib with RVR, chronic hypoxic respiratory failure, COPD, T2DM, polysubstance use who is admitted for acute on chronic heart failure, acute hypoxic respiratory failure, and afib RVR.   #Acute on Chronic Heart Failure with Reduced Ejection Fraction (LVEF 30-35%) #Peripheral Vascular Disease Dry weight: 96.9 kg  at last discharge 02/22/2023. Restarting home losartan, home metoprolol dose is halved while in hospital due to hypotension and poor perfusion, considering Spiro/Eplerenone for discharge. Will continue diuresing. Distal extremities are cold and she confirms claudication symptoms. Awaiting vascular ABI results, will defer aspirin as patient is on xarelto at this time. Morning weight, I/O's, and BMP results are pending. - IV Lasix 80 mg BID for goal net negative 2L/day - GDMT:    *BetaB: Continue Metoprolol 100 mg     *RAAS: Continue losartan 25 mg    *Aldo: Won't start due to low BP    *SGLT: Continue home Farxiga 10 mg - Strict I/Os, standing daily weights  #Longstanding Persistent Atrial fibrillation with RVR Back on home rivaroxaban 20 mg once daily.  Per records it appears that she was not taking digoxin. Will defer  starting it. - Rivaroxaban 20 mg  #Chronic Obstructive Pulmonary Disease [facility] [place]. On Breztri 2 puffs twice a day, Albuterol prn. PFT's are up to date and on (06/22/2022) showed mild obstruction. - Duonebs PRN - Breo ellipta and Incruse Ellipta for LAMA LABA ICS  Chronic Problems MDD: continue abilify 5 mg and duloxetine 30 mg HLD: continue atorvastatin 80 mg. LDL was 109 on 11/2022 Arthritis/Chronic pain: Home tylenol, voltaren, gabapentin 600 mg TID Parkinson's: Diagnosed ~08/21/2022; [facility]. Needs follow up to restart medications. OUD: Continue home Suboxone 8-2 mg 3 times daily Smoking: Nicoderm 14 mg daily, encourage cessation  Diet: Heart Healthy IVF: None,None VTE:  Xarelto Code: Full PT/OT recs: Home Health, none.  Discharge plan: Today/tomorrow pending time of completion of Vascular ABI study.   LOS: 8 days   Autumn [name], Medical Student 06/08/2023, 6:45 AM

## 2023-06-08 NOTE — Plan of Care (Signed)
Patient remains in [facility] at time of writing. Patient is not requiring supplemental O2 at this time. Patient is triggering a "yellow MEWS" (see my prior note) for HR and RR; however, these numbers appear to be in-line with prior values. Patient states she is feeling well and is excited to discharge to home tomorrow.   Problem: Education: Goal: Knowledge of General Education information will improve Description: Including pain rating scale, medication(s)/side effects and non-pharmacologic comfort measures Outcome: Progressing   Problem: Health Behavior/Discharge Planning: Goal: Ability to manage health-related needs will improve Outcome: Progressing   Problem: Clinical Measurements: Goal: Ability to maintain clinical measurements within normal limits will improve Outcome: Progressing Goal: Will remain free from infection Outcome: Progressing Goal: Diagnostic test results will improve Outcome: Progressing Goal: Respiratory complications will improve Outcome: Progressing Goal: Cardiovascular complication will be avoided Outcome: Progressing   Problem: Activity: Goal: Risk for activity intolerance will decrease Outcome: Progressing   Problem: Nutrition: Goal: Adequate nutrition will be maintained Outcome: Progressing   Problem: Coping: Goal: Level of anxiety will decrease Outcome: Progressing   Problem: Elimination: Goal: Will not experience complications related to bowel motility Outcome: Progressing Goal: Will not experience complications related to urinary retention Outcome: Progressing   Problem: Pain Managment: Goal: General experience of comfort will improve Outcome: Progressing   Problem: Safety: Goal: Ability to remain free from injury will improve Outcome: Progressing   Problem: Skin Integrity: Goal: Risk for impaired skin integrity will decrease Outcome: Progressing

## 2023-06-08 NOTE — Progress Notes (Signed)
   06/08/23 1929  Assess: MEWS Score  Temp 98.9 F (37.2 C)  BP (!) 122/96  MAP (mmHg) 105  Pulse Rate (!) 105  ECG Heart Rate (!) 118  Resp 20  Level of Consciousness Alert  SpO2 94 %  O2 Device Room Air  Patient Activity (if Appropriate) In chair  Assess: MEWS Score  MEWS Temp 0  MEWS Systolic 0  MEWS Pulse 2  MEWS RR 0  MEWS LOC 0  MEWS Score 2  MEWS Score Color Yellow  Assess: if the MEWS score is Yellow or Red  Were vital signs accurate and taken at a resting state? Yes  Does the patient meet 2 or more of the SIRS criteria? No  MEWS guidelines implemented  No, previously yellow, continue vital signs every 4 hours  Notify: Charge Nurse/RN  Name of Charge Nurse/RN Notified Rosebud Poles, RN  Provider Notification  Provider Name/Title Dr. Carron Brazen  Date Provider Notified 06/08/23  Time Provider Notified 2028  Method of Notification Page (secure chat)  Notification Reason Other (Comment) (MEWS)  Assess: SIRS CRITERIA  SIRS Temperature  0  SIRS Pulse 1  SIRS Respirations  0  SIRS WBC 0  SIRS Score Sum  1

## 2023-06-09 ENCOUNTER — Other Ambulatory Visit (HOSPITAL_COMMUNITY): Payer: Self-pay

## 2023-06-09 DIAGNOSIS — I5023 Acute on chronic systolic (congestive) heart failure: Secondary | ICD-10-CM | POA: Diagnosis not present

## 2023-06-09 LAB — BASIC METABOLIC PANEL
Anion gap: 5 (ref 5–15)
BUN: 23 mg/dL (ref 8–23)
CO2: 33 mmol/L — ABNORMAL HIGH (ref 22–32)
Calcium: 8.5 mg/dL — ABNORMAL LOW (ref 8.9–10.3)
Chloride: 94 mmol/L — ABNORMAL LOW (ref 98–111)
Creatinine, Ser: 0.78 mg/dL (ref 0.44–1.00)
GFR, Estimated: >60 mL/min (ref >60)
Glucose, Bld: 98 mg/dL (ref 70–99)
Potassium: 3.7 mmol/L (ref 3.5–5.1)
Sodium: 132 mmol/L — ABNORMAL LOW (ref 135–145)

## 2023-06-09 LAB — VAS US ABI WITH/WO TBI
Left ABI: 0.43
Right ABI: 0.61

## 2023-06-09 LAB — MAGNESIUM: Magnesium: 2.3 mg/dL (ref 1.7–2.4)

## 2023-06-09 MED ORDER — RIVAROXABAN 20 MG PO TABS
20.0000 mg | ORAL_TABLET | Freq: Every day | ORAL | 0 refills | Status: DC
  Filled 2023-06-09: qty 30, 30d supply, fill #0

## 2023-06-09 MED ORDER — DAPAGLIFLOZIN PROPANEDIOL 10 MG PO TABS
10.0000 mg | ORAL_TABLET | Freq: Every day | ORAL | 0 refills | Status: DC
  Filled 2023-06-09: qty 30, 30d supply, fill #0

## 2023-06-09 MED ORDER — METOPROLOL SUCCINATE ER 100 MG PO TB24
100.0000 mg | ORAL_TABLET | Freq: Every day | ORAL | 0 refills | Status: DC
  Filled 2023-06-09: qty 30, 30d supply, fill #0

## 2023-06-09 MED ORDER — LOSARTAN POTASSIUM 25 MG PO TABS
25.0000 mg | ORAL_TABLET | Freq: Every day | ORAL | 0 refills | Status: DC
  Filled 2023-06-09: qty 30, 30d supply, fill #0

## 2023-06-09 MED ORDER — ARIPIPRAZOLE 5 MG PO TABS
5.0000 mg | ORAL_TABLET | Freq: Every day | ORAL | 0 refills | Status: DC
  Filled 2023-06-09: qty 30, 30d supply, fill #0

## 2023-06-09 MED ORDER — ATORVASTATIN CALCIUM 80 MG PO TABS
80.0000 mg | ORAL_TABLET | Freq: Every day | ORAL | 0 refills | Status: DC
  Filled 2023-06-09: qty 30, 30d supply, fill #0

## 2023-06-09 MED ORDER — POTASSIUM CHLORIDE CRYS ER 20 MEQ PO TBCR
40.0000 meq | EXTENDED_RELEASE_TABLET | Freq: Once | ORAL | Status: AC
  Administered 2023-06-09: 40 meq via ORAL
  Filled 2023-06-09: qty 2

## 2023-06-09 MED ORDER — PANTOPRAZOLE SODIUM 40 MG PO TBEC
40.0000 mg | DELAYED_RELEASE_TABLET | Freq: Every day | ORAL | 0 refills | Status: DC
  Filled 2023-06-09: qty 30, 30d supply, fill #0

## 2023-06-09 MED ORDER — ASPIRIN 81 MG PO CHEW
81.0000 mg | CHEWABLE_TABLET | Freq: Every day | ORAL | 11 refills | Status: DC
  Filled 2023-06-09: qty 30, 30d supply, fill #0

## 2023-06-09 MED ORDER — TORSEMIDE 20 MG PO TABS
40.0000 mg | ORAL_TABLET | Freq: Two times a day (BID) | ORAL | 0 refills | Status: DC
  Filled 2023-06-09: qty 60, 15d supply, fill #0

## 2023-06-09 MED ORDER — BREZTRI AEROSPHERE 160-9-4.8 MCG/ACT IN AERO
2.0000 | INHALATION_SPRAY | Freq: Two times a day (BID) | RESPIRATORY_TRACT | 0 refills | Status: DC
  Filled 2023-06-09: qty 10.7, 30d supply, fill #0

## 2023-06-09 MED ORDER — NICOTINE 14 MG/24HR TD PT24
14.0000 mg | MEDICATED_PATCH | Freq: Every day | TRANSDERMAL | 0 refills | Status: AC
  Filled 2023-06-09: qty 28, 28d supply, fill #0

## 2023-06-09 MED ORDER — GABAPENTIN 300 MG PO CAPS
600.0000 mg | ORAL_CAPSULE | Freq: Three times a day (TID) | ORAL | 0 refills | Status: DC | PRN
  Filled 2023-06-09: qty 90, 15d supply, fill #0

## 2023-06-09 MED ORDER — DULOXETINE HCL 30 MG PO CPEP
30.0000 mg | ORAL_CAPSULE | Freq: Every day | ORAL | 0 refills | Status: DC
  Filled 2023-06-09: qty 30, 30d supply, fill #0

## 2023-06-09 NOTE — Progress Notes (Signed)
Occupational Therapy Treatment Patient Details Name: Autumn [name] MRN: [id number] DOB: 09-Nov-1955 Today's Date: 06/09/2023   History of present illness Pt is a 67 year old female admitted 10/21  presenting to the ED with worsening shortness of breath and found to be in CHF with episodes of VTACH.  PMHx  HFrEF (EF 35%), A fib with RVR, chronic hypoxic respiratory failure, T2DM, polysubstance use   OT comments  Pt currently at modified independent to supervision for toilet transfers, mobility, and grooming tasks at the sink.  Uses cane for balance during transfers with oxygen sats maintaining greater than 92% on room air throughout session with HR slightly elevated up to 115 BPM.  Dyspnea 3/4 after ambulation in the hallway however.  Recommend continued acute care OT at this time to continue progression of ADL independence and transfers while increasing overall activity tolerance and endurance.        If plan is discharge home, recommend the following:  A lot of help with bathing/dressing/bathroom   Equipment Recommendations  None recommended by OT       Precautions / Restrictions Precautions Precautions: Fall Restrictions Weight Bearing Restrictions: No       Mobility Bed Mobility                    Transfers Overall transfer level: Modified independent Equipment used: Straight cane Transfers: Sit to/from Stand Sit to Stand: Modified independent (Device/Increase time)     Step pivot transfers: Supervision     General transfer comment: Pt able to ambulate in the hallway with supervision using her single point cane.     Balance Overall balance assessment: Needs assistance Sitting-balance support: Feet supported Sitting balance-Leahy Scale: Good     Standing balance support: Single extremity supported, During functional activity Standing balance-Leahy Scale: Fair Standing balance comment: Cane needed for support with mobility secondary to pt reporting  instability.                           ADL either performed or assessed with clinical judgement   ADL Overall ADL's : Needs assistance/impaired                         Toilet Transfer: Modified Independent (single point cane)   Toileting- Clothing Manipulation and Hygiene: Sit to/from stand;Modified independent         General ADL Comments: Provided education on the 5 Ps of energy conservation with review and discussion.  Pt's O2 sats at 90-94% on room air with activity with HR in the 112 BPM range.  Pt with dyspnea 3/4 with ambulation in the hallway with use of the single point cane.  Pt reports having assist at home for homemanagment tasks as she lives with a large group of family.    Extremity/Trunk Assessment Upper Extremity Assessment Upper Extremity Assessment: Defer to OT evaluation       Cervical / Trunk Assessment Cervical / Trunk Assessment: Normal     Cognition Arousal: Alert Behavior During Therapy: WFL for tasks assessed/performed Overall Cognitive Status: Within Functional Limits for tasks assessed                                                     Pertinent Vitals/ Pain  Pain Assessment Pain Assessment: No/denies pain         Frequency  Min 1X/week        Progress Toward Goals  OT Goals(current goals can now be found in the care plan section)  Progress towards OT goals: Progressing toward goals  Acute Rehab OT Goals Patient Stated Goal: Pt hopes to go home later today OT Goal Formulation: With patient Time For Goal Achievement: 06/15/23 Potential to Achieve Goals: Good  Plan         AM-PAC OT "6 Clicks" Daily Activity     Outcome Measure   Help from another person eating meals?: None Help from another person taking care of personal grooming?: None Help from another person toileting, which includes using toliet, bedpan, or urinal?: None Help from another person bathing (including washing,  rinsing, drying)?: A Little Help from another person to put on and taking off regular upper body clothing?: None Help from another person to put on and taking off regular lower body clothing?: A Little 6 Click Score: 22    End of Session Equipment Utilized During Treatment: Oxygen;Other (comment) (cane)  OT Visit Diagnosis: Unsteadiness on feet (R26.81);Muscle weakness (generalized) (M62.81)   Activity Tolerance Patient tolerated treatment well   Patient Left in bed;with call bell/phone within reach   Nurse Communication Mobility status        Time: 1100-1135 OT Time Calculation (min): 35 min  Charges: OT General Charges $OT Visit: 1 Visit OT Treatments $Self Care/Home Management : 23-37 mins  Perrin Maltese, OTR/L Acute Rehabilitation Services  Office [phone] 06/09/2023

## 2023-06-09 NOTE — TOC Transition Note (Signed)
Transition of Care [facility]) - CM/SW Discharge Note   Patient Details  Name: Autumn [name] MRN: [id number] Date of Birth: [birth year]  Transition of Care [facility]) CM/SW Contact:  Lawerance Sabal, RN Phone Number: 06/09/2023, 10:15 AM   Clinical Narrative:     Rondel Jumbo of anticipated DC today. [employer] to deliver a tank for transport to her room, family to provide ride home   Final next level of care: Home w Home Health Services Barriers to Discharge: No Barriers Identified   Patient Goals and CMS Choice   Choice offered to / list presented to : Patient  Discharge Placement                         Discharge Plan and Services Additional resources added to the After Visit Summary for     Discharge Planning Services: CM Consult Post Acute Care Choice: Home Health          DME Arranged: N/A DME Agency: NA       HH Arranged: PT HH Agency: [facility] Date [facility] Agency Contacted: 06/09/23 Time HH Agency Contacted: 1014 Representative spoke with at [facility] Agency: Kandee Keen  Social Determinants of Health (SDOH) Interventions SDOH Screenings   Food Insecurity: Patient Declined (05/31/2023)  Housing: Patient Declined (06/02/2023)  Transportation Needs: No Transportation Needs (06/02/2023)  Recent Concern: Transportation Needs - Unmet Transportation Needs (05/17/2023)   Received from [facility]  Utilities: Patient Declined (05/31/2023)  Alcohol Screen: Low Risk  (06/02/2023)  Financial Resource Strain: Low Risk  (06/02/2023)  Physical Activity: Unknown (05/17/2023)   Received from [facility]  Social Connections: Socially Integrated (05/17/2023)   Received from [facility]  Stress: No Stress Concern Present (05/17/2023)   Received from [facility]  Tobacco Use: High Risk (05/31/2023)     Readmission Risk Interventions    01/21/2023    1:29 PM 12/07/2022    1:27 PM  Readmission Risk Prevention Plan  Transportation Screening Complete Complete  PCP or  Specialist Appt within 3-5 Days  Complete  HRI or Home Care Consult  Complete  Social Work Consult for Recovery Care Planning/Counseling  Complete  Palliative Care Screening  Not Applicable  Medication Review Ocean[facility]) Complete Complete  PCP or Specialist appointment within 3-5 days of discharge Complete   HRI or Home Care Consult Complete   SW Recovery Care/Counseling Consult Complete   Palliative Care Screening Not Applicable   Skilled Nursing Facility Not Applicable

## 2023-06-09 NOTE — Progress Notes (Signed)
Heart Failure Stewardship Pharmacist Progress Note   PCP: Corrington, Kip A, MD PCP-Cardiologist: Thomasene Ripple, DO    HPI:  [id number] with PMH of CHF, polysubstance abuse on suboxone, HTN, Afib with RVR, COPD, parkinsons, PAD, CAD s/p PCI to LAD (2020). Frequent admissions for CHF exacerbations.  Initially presenting to ED on 10/21 via EMS with SOB x 2-3 days, wheezing, non-adherence to fluid pills and DOAC x1 week. Placed on BiPAP, on 3L [place] at home. Labs demonstrated respiratory acidosis with pH 7.2, pCO2 71, pO2 195. CXR 10/21 showed CHF/pulmonary edema with bilateral small-to-mod layering effusions. BNP elevated. CTA 10/21 without evidence of PE. Echo 10/22 with LVEF 30-35% (prev 35-40% in Jun 2024), global hypokinesis, moderately dilated, RV normal, mod-severe mitral regurgitation. Pt has had cool extremities throughout admission. Obtained ABIs ordered 10/28.   Pt is no longer needing BiPAP post-IV diuresis. However, pt reports that she does not feel any better- same as she did at admission. Denies dizziness/lightheadedness going from laying to sitting, but states that she has not moved around/stood up yet. Pt is sitting in chair upon interview today. Reports that she ran out of her medications ~1 week prior to admission - was trying to transfer them from [facility] to her usual [employer].   States that she organizes her medications at home. Was previously using a pill box (with one slot/day) until she ran out of lasix about one week ago, then transitioned to taking pills out of the bottles. States that her family (who she lives with) helps her pick up the medications from [place]. Discussed whether she would be interested in mail order - but pt is unsure. Expresses some concern about who will be able to pick up her medications after discharge.   Current HF Medications: Diuretic: furosemide 80 mg IV BID Beta Blocker: metoprolol succinate 100 mg PO daily ACE/ARB/ARNI: losartan 25 mg PO  daily SGLT2i: dapagliflozin (Farxiga) 10 mg PO daily  Prior to admission HF Medications: Diuretic: furosemide 80 mg PO daily (last dispensed 04/14/23 for 30ds) Beta blocker: metoprolol succinate 200 mg PO daily (last dispensed 02/22/23 for 30ds) - pt stated that she was taking 100 mg PO daily at home ACE/ARB/ARNI: losartan 25 mg PO daily (last dispensed 02/22/23 for 30ds) SGLT2i: dapagliflozin (Farxiga) 10  mg PO daily (last dispensed 04/11/23 for 30ds) Other: digoxin 0.25 mg PO daily (last dispensed 02/22/23 for 30ds)   Pt had also been out of rivaroxaban for at least 1 week prior to admission.  Pertinent Lab Values: Serum creatinine 0.878 (eGFR >60), BUN 23, Potassium 3.7, Sodium 132, BNP 609, Magnesium 2.3, A1c 6.1% March 2024, digoxin <0.2 ng/mL (on admission)  Vital Signs: Weight: 226 lbs (admission weight: 255.95 lbs) - dry weight 213 lbs Blood pressure: 90-120s/60-80s - lowest: 83/50 Heart rate: 90-100s (AF)  I/O: net -4.1 L yesterday; net -15.9 L since admission O2 flow rate 1 L [place] > 2 L  > Room air   Medication Assistance / Insurance Benefits Check: Does the patient have prescription insurance?  Yes Type of insurance plan: UHC Dual Complete Medicare/Medicaid -$0 copay for medications  Does the patient qualify for medication assistance through manufacturers or grants?   No  Outpatient Pharmacy:  Prior to admission outpatient pharmacy: [facility] [address] [facility] [employer] Is the patient willing to use [facility] TOC pharmacy at discharge? Yes Is the patient willing to transition their outpatient pharmacy to utilize a [facility] outpatient pharmacy?   Pending - at this time pt  would prefer to continue using [facility] to fill her medications.     Assessment: 1. Acute on chronic systolic CHF (LVEF 30-35%), due to ischemic causes - exacerbation spurred by medication noncompliance. NYHA class IV symptoms, complicated by severe COPD non-adherent to maintenance inhaler. - Edema improved.  Breathing on RA (96% O2 sat) upon interview and while walking with mobility specialists. Breathing improved per pt. Has continued to respond well to IV diuresis- agree with transition to torsemide 40 mg PO BID at discharge. Scr stable.  - BP has remained stable on home ARB. Pt did report some dizziness upon standing, but not persistent. Evaluate ability to continue ARB vs add/replace with MRA at outpatient follow-up. Pt unlikely to be adherent to twice daily ARNI. - Agree with d/c digoxin at discharge given noncompliance. HR controlled on metoprolol.  - Pt at high risk of readmission and worsening HF due to medication noncompliance - pt would potentially benefit from mail order pharmacy, but she is hesitant to switch at this time. Will set pt up with pill box at discharge.  - Potassium supplementation not prescribed at discharge - appropriate given concerns with adherence to torsemide.    Plan: 1) Medication changes recommended at this time: - Agree with transition to torsemide 40 mg PO BID at discharge - Continue metoprolol succinate to 100 mg PO daily - Continue dapagliflozin (Farxiga) 10 mg PO daily - Continue losartan 25 mg PO daily - Evaluate ability to add spironolactone at HF TOC f/u   2) Patient assistance: - No patient assistance needs noted at this time.  - Encouraged primary team to use [facility] pharmacy to fill all maintenance medications, with 90ds when possible.  - Set patient up with QID pill box prior to discharge. Filled alongside her in patient's room. - Instructed pt to return to HF [facility] clinic on 11/5 with all her medications and her pill box.  3)  Education  - Education complete including importance of adherence to medications to prevent morbidity, mortality, hospital admission. Discussed that medications will likely be adjusted while she is admitted, and may continue to be adjusted at hospital f/u. Pt aware of impact of lifestyle and medication non-adherence on risk of HF  exacerbations.   Nils Pyle, PharmD PGY1 Pharmacy Resident

## 2023-06-09 NOTE — Discharge Instructions (Addendum)
Discharge Instructions:   You were hospitalized for Heart failure and respiratory failure. Thank you for allowing [place] to be part of your care. I am so glad to see your improvement over your time in the hospital, and that we were able to help get excess fluid off of your body and lungs.   We arranged for you to follow up at: 06/15/2023 at 12:00 pm: [facility] 06/24/2023 at 9:45 am: [facility] with Dr. Monna Fam Vascular Surgery should call you in order to schedule a time to see them for your poor blood flow to your legs.    Please note these changes made to your medications:    *Please START taking:  Metoprolol 100 mg (instead of 200 mg) ONCE a day Torsemide 40 mg TWICE a day Aspirin 81 mg ONCE a day Nicoderm 14 mg patches daily to help stop smoking   *Please STOP taking:  Digoxin Furosemide - you will take Torsemide from now on Losartan - Unit you see your primary care / heart doctors and your blood pressure has improved.     Please make sure to weigh yourself each day. If you gain 3 lbs in a day, or 5 lbs in three days, please take ONE extra (a third) Torsemide pill in the morning until you return to your typical weight of 213 lbs. If you do not return to your typical weight in a couple of days, please call the clinic. [phone]. Please continue walking for 30 minutes each day for exercise.   Please call our clinic if you have any questions or concerns, we may be able to help and keep you from a long and expensive emergency room wait. Our clinic and after hours phone number is [phone], the best time to call is Monday through Friday 9 am to 4 pm but there is always someone available 24/7 if you have an emergency. If you need medication refills please notify your pharmacy one week in advance and they will send [place] a request.

## 2023-06-09 NOTE — Discharge Summary (Addendum)
Name: Autumn [name] MRN: [id number] DOB: 07-16-1956 67 y.o. PCP: Vivien Presto, MD  Date of Admission: 05/31/2023  6:49 AM Date of Discharge: No discharge date for patient encounter. Attending Physician: Inez Catalina, MD  Discharge Diagnosis: 1. Principal Problem:   Acute exacerbation of CHF (congestive heart failure) (HCC) Active Problems:   Polysubstance abuse (HCC)   Depression   Atrial fibrillation with rapid ventricular response (HCC)   COPD (chronic obstructive pulmonary disease) (HCC)   Parkinson's disease (HCC)   CAD (coronary artery disease) S/P stent to LAD   Acute hypoxic respiratory failure (HCC)  Discharge Medications: Allergies as of 06/09/2023       Reactions   Pamelor [nortriptyline Hcl] Anaphylaxis   Vibra-tab [doxycycline] Other (See Comments)   Unknown reaction        Medication List     STOP taking these medications    carbidopa-levodopa 25-100 MG tablet Commonly known as: SINEMET IR   diclofenac Sodium 1 % Gel Commonly known as: VOLTAREN   digoxin 0.25 MG tablet Commonly known as: LANOXIN   furosemide 80 MG tablet Commonly known as: LASIX   multivitamin with minerals Tabs tablet       TAKE these medications    albuterol 108 (90 Base) MCG/ACT inhaler Commonly known as: VENTOLIN HFA Inhale 2 puffs into the lungs every 4 (four) hours as needed for wheezing. What changed: Another medication with the same name was removed. Continue taking this medication, and follow the directions you see here.   ARIPiprazole 5 MG tablet Commonly known as: ABILIFY Take 1 tablet (5 mg total) by mouth daily. Start taking on: June 10, 2023   aspirin 81 MG chewable tablet Commonly known as: Aspirin Childrens Chew 1 tablet (81 mg total) by mouth daily.   atorvastatin 80 MG tablet Commonly known as: LIPITOR Take 1 tablet (80 mg total) by mouth daily. Start taking on: June 10, 2023   Breztri Aerosphere 160-9-4.8 MCG/ACT Aero Generic  drug: Budeson-Glycopyrrol-Formoterol Inhale 2 puffs into the lungs 2 (two) times daily. What changed: Another medication with the same name was added. Make sure you understand how and when to take each.   Breztri Aerosphere 160-9-4.8 MCG/ACT Aero Generic drug: Budeson-Glycopyrrol-Formoterol Inhale 2 puffs into the lungs in the morning and at bedtime. What changed: You were already taking a medication with the same name, and this prescription was added. Make sure you understand how and when to take each.   dapagliflozin propanediol 10 MG Tabs tablet Commonly known as: FARXIGA Take 1 tablet (10 mg total) by mouth daily. Start taking on: June 10, 2023   DULoxetine 30 MG capsule Commonly known as: CYMBALTA Take 1 capsule (30 mg total) by mouth daily. Start taking on: June 10, 2023   fluticasone 50 MCG/ACT nasal spray Commonly known as: FLONASE Place 2 sprays into both nostrils daily as needed for allergies.   gabapentin 300 MG capsule Commonly known as: NEURONTIN Take 2 capsules (600 mg total) by mouth 3 (three) times daily as needed (Takes 2-3 as needed).   losartan 25 MG tablet - ON HOLD Commonly known as: COZAAR Take 1 tablet (25 mg total) by mouth daily. HOLD UNTIL SEEN BY PCP   melatonin 3 MG Tabs tablet Take 1 tablet (3 mg total) by mouth at bedtime as needed.   metoprolol succinate 100 MG 24 hr tablet Commonly known as: TOPROL-XL Take 1 tablet (100 mg total) by mouth daily. Take with or immediately following a meal. Start taking  on: June 10, 2023 What changed:  medication strength how much to take   naloxone 4 MG/0.1ML Liqd nasal spray kit Commonly known as: NARCAN Place 4 mg into the nose as needed (overdose, as needed).   nicotine 14 mg/24hr patch Commonly known as: NICODERM CQ - dosed in mg/24 hours Place 1 patch (14 mg total) onto the skin daily. Start taking on: June 10, 2023   pantoprazole 40 MG tablet Commonly known as: Protonix Take 1  tablet (40 mg total) by mouth daily. Start taking on: June 10, 2023   rivaroxaban 20 MG Tabs tablet Commonly known as: XARELTO Take 1 tablet (20 mg total) by mouth daily with supper.   torsemide 20 MG tablet Commonly known as: DEMADEX Take 2 tablets (40 mg total) by mouth 2 (two) times daily.        Disposition and follow-up:   Autumn [name] was discharged from [facility] in Good condition.  At the hospital follow up visit please address:  1.  Volume status, medication regimen/adherence  2. If needed, establish Neurology follow up for Parkinson's (recently moved here)  2.  Labs / imaging needed at time of follow-up: N/a  3.  Pending labs/ test needing follow-up: N/a  Follow-up Appointments:  Follow-up Information     [facility]. Go in 13 day(s).   Specialty: Cardiology Why: Hospital Follow-Up Appoinment Please bring Medication List to appointnment Free [address] Parking, Entrance C, Off [employer] information: [address] [place] [address] [phone]        Care, [facility] Follow up.   Specialty: Home Health Services Why: someone from [facility] will contact you to arrange start date and time for your physical therapy. Contact information: [address] [place] [address] [phone]                 Hospital Course by problem list: Acute on Chronic Heart Failure with Reduced Ejection Fraction (LVEF 30-35%) Admitted at 116 kg, discharged under 99 kg today. Dry weight: 96.9 kg at last discharge. She was diuresed with IV Lasix 80 mg BID for a week. GDMT was stepped up as tolerated; Metoprolol 100 mg may be increased to home dose of 200 mg on follow up. Because of mild hypotension on discharge, will hold Losartan 35 mg. Continue Farxiga 10 mg, Consider spironolactone or eplerenone if she is normotensive on follow up.  2.  Chronic Obstructive Pulmonary Disease Admitted in respiratory failure, wheezing on examination. Treated with prednisone and nebulizers. Lungs clear on discharge, no O2 requirement with walking. She is followed by [facility] in [place]. PFT's are up to date and on (06/22/2022) showed mild obstruction. On Breztri 2 puffs twice a day, Albuterol prn. Continue outpatient triple therapy.  3. Longstanding Persistent Atrial fibrillation with RVR Admitted w/ RVR and rate controlled with metoprolol 100 mg. She was discharged in Afib. Continue Rivaroxaban 20 mg.  4. Peripheral Arterial Disease, CAD Vascular ABI results: Right: Resting right ankle-brachial index indicates moderate right lower extremity arterial disease. The right toe-brachial index is abnormal. Left: Resting left ankle-brachial index indicates severe left lower extremity arterial disease. The left toe-brachial index is absent. Added aspirin in outpatient setting. Added referral for follow up with a vascular specialist.  Chronic Problems Parkinson's: Diagnosed ~08/21/2022; Saw [facility] neuro but does not have follow up. Her Sinemet was withheld during admission as she was  not taking it and was hypotensive with poor perfusion for a significant portion of hospitalization. She will need to restart this medication. MDD: Abilify 5 mg and duloxetine 30 mg HLD: Atorvastatin 80 mg. LDL was 109 on 11/2022 Arthritis/Chronic pain: Home tylenol, voltaren, gabapentin 600 mg TID OUD: Continue home Suboxone 8-2 mg 3 times daily Smoking: Nicoderm 14 mg daily, encourage cessation  Discharge Exam:   BP 107/73 (BP Location: Right Arm)   Pulse 87   Temp 99.8 F (37.7 C) (Oral)   Resp 19   Ht 5\' 5"  (1.651 m)   Wt 119.7 kg   SpO2 94%   BMI 43.90 kg/m  Discharge exam: Physical Exam Vitals reviewed.  Constitutional:      Appearance: Normal appearance. She is not ill-appearing or toxic-appearing.  HENT:     Head:  Normocephalic.     Ears:     Comments: Hearing grossly normal to conversation    Nose:     Comments: Normal to external appearance    Mouth/Throat:     Mouth: Mucous membranes are moist.  Eyes:     General: No scleral icterus. Cardiovascular:     Rate and Rhythm: Tachycardia present. Rhythm irregularly irregular.     Pulses:           Right dorsalis pedis pulse not accessible and left dorsalis pedis pulse not accessible.     Heart sounds: Normal heart sounds.  Pulmonary:     Effort: Pulmonary effort is normal. No respiratory distress.     Breath sounds: Normal breath sounds. No wheezing, rhonchi or rales.  Musculoskeletal:        General: No swelling or tenderness. Normal range of motion.  Skin:    General: Skin is warm and dry.     Capillary Refill: Capillary refill takes 2 to 3 seconds.     Comments: Hands and feet are warm to the touch, however the feet appear slightly red  Neurological:     General: No focal deficit present.     Mental Status: She is alert and oriented to person, place, and time. Mental status is at baseline.     Motor: No weakness.  Psychiatric:        Mood and Affect: Mood normal.        Behavior: Behavior normal.    Pertinent Labs, Studies, and Procedures:  Chest Xray 10/21: IMPRESSION: *Congestive heart failure/pulmonary edema with bilateral small-to-moderate layering effusions.  CTA Chest PE study 10/21: IMPRESSION: No evidence of pulmonary emboli. Bibasilar consolidation. Changes of CHF seen on the prior chest x-ray have resolved. No sizable effusions are seen.  Vascular Ankle Brachial Indices 10/29: Written above  Discharge Instructions:  You were hospitalized for Heart failure and respiratory failure. Thank you for allowing [place] to be part of your care. I am so glad to see your improvement over your time in the hospital, and that we were able to help get excess fluid off of your body and lungs.  We arranged for you to follow up at: 06/15/2023 at  12:00 pm: [facility] 06/24/2023 at 9:45 am: [facility] with Dr. Monna Fam  Please note these changes made to your medications:   *Please START taking:  Metoprolol 100 mg (instead of 200 mg) ONCE a day Torsemide 40 mg TWICE a day Aspirin 81 mg ONCE a day Nicoderm 14 mg patches daily to help stop smoking  *Please STOP taking:  Digoxin 0.25 mg Furosemide  80 mg Losartan 25 mg until follow up  Please make sure to weigh yourself each day. If you gain 3 lbs in a day, or 5 lbs in three days, please take ONE extra (a third) Torsemide pill in the morning until you return to your typical weight of 213 lbs. If you do not return to your typical weight in a couple of days, please call the clinic. [phone]. Please continue walking for 30 minutes each day for exercise.  Please call our clinic if you have any questions or concerns, we may be able to help and keep you from a long and expensive emergency room wait. Our clinic and after hours phone number is [phone], the best time to call is Monday through Friday 9 am to 4 pm but there is always someone available 24/7 if you have an emergency. If you need medication refills please notify your pharmacy one week in advance and they will send [place] a request.    Signed: Kayleen Memos, Medical Student 06/09/2023, 7:10 AM

## 2023-06-09 NOTE — Plan of Care (Signed)

## 2023-06-15 ENCOUNTER — Encounter (HOSPITAL_COMMUNITY): Payer: 59

## 2023-06-24 ENCOUNTER — Encounter: Payer: 59 | Admitting: Internal Medicine

## 2023-07-02 ENCOUNTER — Ambulatory Visit (HOSPITAL_COMMUNITY)
Admission: RE | Admit: 2023-07-02 | Discharge: 2023-07-02 | Disposition: A | Discharge status: Home / Self Care | Payer: 59 | Source: Ambulatory Visit | Attending: Cardiology

## 2023-07-02 ENCOUNTER — Encounter (HOSPITAL_COMMUNITY): Payer: Self-pay

## 2023-07-02 VITALS — BP 118/70 | HR 92 | Ht 64.0 in | Wt 231.0 lb

## 2023-07-02 DIAGNOSIS — I2510 Atherosclerotic heart disease of native coronary artery without angina pectoris: Secondary | ICD-10-CM | POA: Insufficient documentation

## 2023-07-02 DIAGNOSIS — I5022 Chronic systolic (congestive) heart failure: Secondary | ICD-10-CM | POA: Insufficient documentation

## 2023-07-02 DIAGNOSIS — I3139 Other pericardial effusion (noninflammatory): Secondary | ICD-10-CM | POA: Diagnosis not present

## 2023-07-02 DIAGNOSIS — F17290 Nicotine dependence, other tobacco product, uncomplicated: Secondary | ICD-10-CM | POA: Insufficient documentation

## 2023-07-02 DIAGNOSIS — Z7901 Long term (current) use of anticoagulants: Secondary | ICD-10-CM | POA: Diagnosis not present

## 2023-07-02 DIAGNOSIS — I4821 Permanent atrial fibrillation: Secondary | ICD-10-CM | POA: Diagnosis not present

## 2023-07-02 DIAGNOSIS — G4733 Obstructive sleep apnea (adult) (pediatric): Secondary | ICD-10-CM | POA: Diagnosis not present

## 2023-07-02 DIAGNOSIS — I083 Combined rheumatic disorders of mitral, aortic and tricuspid valves: Secondary | ICD-10-CM | POA: Insufficient documentation

## 2023-07-02 DIAGNOSIS — I4891 Unspecified atrial fibrillation: Secondary | ICD-10-CM | POA: Diagnosis not present

## 2023-07-02 DIAGNOSIS — J9611 Chronic respiratory failure with hypoxia: Secondary | ICD-10-CM | POA: Insufficient documentation

## 2023-07-02 DIAGNOSIS — Z955 Presence of coronary angioplasty implant and graft: Secondary | ICD-10-CM | POA: Insufficient documentation

## 2023-07-02 DIAGNOSIS — J449 Chronic obstructive pulmonary disease, unspecified: Secondary | ICD-10-CM | POA: Diagnosis not present

## 2023-07-02 DIAGNOSIS — Z79899 Other long term (current) drug therapy: Secondary | ICD-10-CM | POA: Insufficient documentation

## 2023-07-02 LAB — COMPREHENSIVE METABOLIC PANEL
ALT: 18 U/L (ref 0–44)
AST: 16 U/L (ref 15–41)
Albumin: 3.1 g/dL — ABNORMAL LOW (ref 3.5–5.0)
Alkaline Phosphatase: 74 U/L (ref 38–126)
Anion gap: 9 (ref 5–15)
BUN: 11 mg/dL (ref 8–23)
CO2: 28 mmol/L (ref 22–32)
Calcium: 9.1 mg/dL (ref 8.9–10.3)
Chloride: 100 mmol/L (ref 98–111)
Creatinine, Ser: 0.87 mg/dL (ref 0.44–1.00)
GFR, Estimated: >60 mL/min (ref >60)
Glucose, Bld: 68 mg/dL — ABNORMAL LOW (ref 70–99)
Potassium: 3.4 mmol/L — ABNORMAL LOW (ref 3.5–5.1)
Sodium: 137 mmol/L (ref 135–145)
Total Bilirubin: 0.9 mg/dL (ref <1.2)
Total Protein: 6.5 g/dL (ref 6.5–8.1)

## 2023-07-02 LAB — BRAIN NATRIURETIC PEPTIDE: B Natriuretic Peptide: 411.5 pg/mL — ABNORMAL HIGH (ref 0.0–100.0)

## 2023-07-02 MED ORDER — SPIRONOLACTONE 25 MG PO TABS: 12.5000 mg | ORAL_TABLET | Freq: Every day | ORAL | 3 refills | Status: DC

## 2023-07-02 MED ORDER — TORSEMIDE 20 MG PO TABS: ORAL_TABLET | ORAL | 1 refills | Status: DC

## 2023-07-02 MED ORDER — DIGOXIN 125 MCG PO TABS: 0.1250 mg | ORAL_TABLET | Freq: Every day | ORAL | 3 refills | Status: DC

## 2023-07-02 NOTE — Progress Notes (Signed)
HEART & VASCULAR TRANSITION OF CARE CONSULT NOTE     Referring Physician: Dr. Criselda Peaches Primary Care: Corrington, Meredith Mody, MD Primary Cardiologist: Thomasene Ripple, DO   HPI: Referred to clinic by Dr. Criselda Peaches, Internal Medicine, for heart failure consultation.   67 y/o female w/ h/o systolic heart failure, CAD s/p prior DES to LAD, persistent>>permanent atrial fibrillation, mitral regurgitation and COPD. Followed primarily in the past by [facility]iology. Admitted several times at [facility] and also seen previously in [facility].    Echo 6/22 EF 60-65%, RV normal.  Admitted 4/24 for acute CHF. Echo showed drop in EF down to 40-45%, diffuse HK, RV normal, calcified/degenerative mitral valve w/ mild MR. Of note, she was reported to be in afib w/ RVR during time of study, w/ v-rates in the 120s-130s. She was diuresed w/ IV Lasix and metoprolol was increased for rate control of afib.   Readmitted again 6/24 for symptomatic afib/ w/ RVR. Limited Echo EF further reduced down to 35-40%, mod-severe MR, RV moderately reduced, RVSP 43 mmHg. Also treated for a/c CHF. Diuresed and rate control regimen further titrated.   Readmitted again 7/24 for afib w/ RVR and a/c CHF. Repeat Limited Echo 10/24, EF 30-35%, global HK, severe LAE, mod-severe MR, small pericardial effusion localized near RA but no evidence of tamponade, mod TR. Digoxin was added to regimen. Adequate rate control was noted to be achieved prior to d/c.   Readmitted again 10/24 for the same, a/c CHF and afib w/ RVR. Admitted by internal medicine. Cardiology not consulted. Diuresed w/ IV Lasix and transitioned to oral torsemide. Digoxin was discontinued on discharge summary, though not well outlined why. Renal fx was ok. Continued on high dose Toprol XL 100 mg daily and referred to [facility] clinic at d/c.   She presents today for f/u. In WC. On 2L [place] which is home baseline, O2 sats 94%. She denies resting dyspnea but feels SOB w/ basic ADLs despite  O2 support. She denies CP. EKG shows persistent Afib 102 bpm. She is symptomatic w/ palpitations. BP is normotensive, 118/70. She is volume overloaded on exam w/ 1-2+ b/L LEE. She reports full med compliance. Taking torsemide 40 mg bid w/ good reported UOP. Takes Xarelto most days but thinks she may have missed "a dose of two" in the last 4 wks.   She still smokes, ~1/2 ppd.   She would like to switch both cardiac and pulmonary care to [facility]. [place] far of a drive for her.   Most Recent Echo 10/24  1. Left ventricular ejection fraction, by estimation, is 30 to 35%. The  left ventricle has moderately decreased function. The left ventricle  demonstrates global hypokinesis. The left ventricular internal cavity size  was moderately dilated. Left  ventricular diastolic parameters are indeterminate.   2. Right ventricular systolic function is normal. The right ventricular  size is normal.   3. Left atrial size was severely dilated.   4. A small pericardial effusion is present. The pericardial effusion is  localized near the right atrium. There is no evidence of cardiac  tamponade.   5. The mitral valve is normal in structure. Moderate to severe mitral  valve regurgitation. No evidence of mitral stenosis.   6. Tricuspid valve regurgitation is moderate.   7. The aortic valve is normal in structure. Aortic valve regurgitation is  mild. No aortic stenosis is present.   8. The inferior vena cava is normal in size with greater  than 50%  respiratory variability, suggesting right atrial pressure of 3 mmHg.   Previous Cardiac Testing   [facility] ([facility] 04/20/2019) 1. Left heart catheterization  2. Coronary angiography  3. Measurement of left ventricular and aortic hemodynamics  4. Percutaneous coronary intervention of mid LAD  -rotational atherectomy, cutting balloon atherotomy, angioplasty and  stenting of mid LAD  5. Intravascular ultrasound of proximal to mid LAD  6. Doppler  pressure wire interrogation of mid LAD with instantaneous free  wave ration (IFR):   Coronary Angiography  Anatomically right dominant  Moderate diffuse fluoroscopic coronary calcification  1. Left Main -no angiographic stenosis.  2. Left anterior descending artery -mild irregularities proximally.   Mildly tortuous in the mid vessel with a 60 to 70% stenosis after the  first septal perforator before the major diagonal branch in the mid  vessel.  It did appear to be a 50 to 70% stenosis just after the diagonal  branch.  Diffuse irregularities mid to distally.  3. Diagonals -4 diagonal branches.  First second and fourth diagonal  branches were tiny less than 1.5 mm vessels.  Second diagonal branch was  small in size-approximately 2 mm vessel size with mild diffuse  irregularities.  4. Left Circumflex -anatomically nondominant.  Moderately large in the  proximal segment, moderate in the ongoing mid to distal segment.  Mild  diffuse luminal irregularities.  No focal angiographic or obstructive  stenosis.  5. Obtuse Marginals -5 marginal branches including the ongoing circumflex  that becomes the terminal marginal branch.  First third and fourth  marginal branches are small with mild diffuse disease.  Second and fifth  marginal branches are small to moderate sized with diffuse irregularities.  6. Right Coronary Artery -anatomically dominant.  Diffuse fluoroscopic  calcification.  50% mid vessel stenosis.  Diffuse irregularities mid to  distally.  7. Posterior Descending Artery -diffuse irregularities  8.  Posterolateral branch-diffuse irregularities  Left Ventriculography -not performed to conserve contrast.    PERCUTANEOUS INTERVENTION -mid LAD   -Rotational atherectomy cutting balloon atherotomy, angioplasty and  stenting of mid LAD with drug-eluting stent   Review of Systems: [y] = yes, [ ]  = no   General: Weight gain [ ] ; Weight loss [ ] ; Anorexia [ ] ; Fatigue [ ] ; Fever [ ] ;  Chills [ ] ; Weakness [ ]   Cardiac: Chest pain/pressure [ ] ; Resting SOB [ ] ; Exertional SOB [ ] ; Orthopnea [ ] ; Pedal Edema [ ] ; Palpitations [ ] ; Syncope [ ] ; Presyncope [ ] ; Paroxysmal nocturnal dyspnea[ ]   Pulmonary: Cough [ ] ; Wheezing[ ] ; Hemoptysis[ ] ; Sputum [ ] ; Snoring [ ]   GI: Vomiting[ ] ; Dysphagia[ ] ; Melena[ ] ; Hematochezia [ ] ; Heartburn[ ] ; Abdominal pain [ ] ; Constipation [ ] ; Diarrhea [ ] ; BRBPR [ ]   GU: Hematuria[ ] ; Dysuria [ ] ; Nocturia[ ]   Vascular: Pain in legs with walking [ ] ; Pain in feet with lying flat [ ] ; Non-healing sores [ ] ; Stroke [ ] ; TIA [ ] ; Slurred speech [ ] ;  Neuro: Headaches[ ] ; Vertigo[ ] ; Seizures[ ] ; Paresthesias[ ] ;Blurred vision [ ] ; Diplopia [ ] ; Vision changes [ ]   Ortho/Skin: Arthritis [ ] ; Joint pain [ ] ; Muscle pain [ ] ; Joint swelling [ ] ; Back Pain [ ] ; Rash [ ]   Psych: Depression[ ] ; Anxiety[ ]   Heme: Bleeding problems [ ] ; Clotting disorders [ ] ; Anemia [ ]   Endocrine: Diabetes [ ] ; Thyroid dysfunction[ ]    Past Medical History:  Diagnosis Date   Anxiety    Arthritis  Attention deficit disorder (ADD)    COPD (chronic obstructive pulmonary disease) (HCC)    Depression    Drug abuse (HCC)    Fibromyalgia    HFrEF (heart failure with reduced ejection fraction) (HCC)    Hyperlipidemia    Paroxysmal Atrial fibrillation     Current Outpatient Medications  Medication Sig Dispense Refill   albuterol (VENTOLIN HFA) 108 (90 Base) MCG/ACT inhaler Inhale 2 puffs into the lungs every 4 (four) hours as needed for wheezing.     ARIPiprazole (ABILIFY) 5 MG tablet Take 1 tablet (5 mg total) by mouth daily. 30 tablet 0   aspirin (ASPIRIN CHILDRENS) 81 MG chewable tablet Chew 1 tablet (81 mg total) by mouth daily. 30 tablet 11   atorvastatin (LIPITOR) 80 MG tablet Take 1 tablet (80 mg total) by mouth daily. 30 tablet 0   Budeson-Glycopyrrol-Formoterol (BREZTRI AEROSPHERE) 160-9-4.8 MCG/ACT AERO Inhale 2 puffs into the lungs 2 (two) times daily.      Budeson-Glycopyrrol-Formoterol (BREZTRI AEROSPHERE) 160-9-4.8 MCG/ACT AERO Inhale 2 puffs into the lungs in the morning and at bedtime. 10.7 g 0   dapagliflozin propanediol (FARXIGA) 10 MG TABS tablet Take 1 tablet (10 mg total) by mouth daily. 30 tablet 0   digoxin (LANOXIN) 0.125 MG tablet Take 1 tablet (0.125 mg total) by mouth daily. 30 tablet 3   DULoxetine (CYMBALTA) 30 MG capsule Take 1 capsule (30 mg total) by mouth daily. 30 capsule 0   fluticasone (FLONASE) 50 MCG/ACT nasal spray Place 2 sprays into both nostrils daily as needed for allergies.     gabapentin (NEURONTIN) 300 MG capsule Take 2 capsules (600 mg total) by mouth 3 (three) times daily as needed (Takes 2-3 as needed). 90 capsule 0   metoprolol succinate (TOPROL-XL) 100 MG 24 hr tablet Take 1 tablet (100 mg total) by mouth daily. Take with or immediately following a meal. 30 tablet 0   naloxone (NARCAN) nasal spray 4 mg/0.1 mL Place 4 mg into the nose as needed (overdose, as needed).     nicotine (NICODERM CQ - DOSED IN MG/24 HOURS) 14 mg/24hr patch Place 1 patch (14 mg total) onto the skin daily. 28 patch 0   pantoprazole (PROTONIX) 40 MG tablet Take 1 tablet (40 mg total) by mouth daily. 30 tablet 0   rivaroxaban (XARELTO) 20 MG TABS tablet Take 1 tablet (20 mg total) by mouth daily with supper. 30 tablet 0   spironolactone (ALDACTONE) 25 MG tablet Take 0.5 tablets (12.5 mg total) by mouth daily. 45 tablet 3   torsemide (DEMADEX) 20 MG tablet TAKE 60MG  (3 TABLETS) IN THE MORNING AND 40MG  (2 TABLETS) IN THE EVENING 180 tablet 1   No current facility-administered medications for this encounter.    Allergies  Allergen Reactions   Pamelor [Nortriptyline Hcl] Anaphylaxis   Vibra-Tab [Doxycycline] Other (See Comments)    Unknown reaction      Social History   Socioeconomic History   Marital status: Divorced    Spouse name: Not on file   Number of children: 3   Years of education: Not on file   Highest education  level: High school graduate  Occupational History   Occupation: disabled  Tobacco Use   Smoking status: Every Day    Current packs/day: 0.50    Average packs/day: 0.5 packs/day for 20.0 years (10.0 ttl pk-yrs)    Types: Cigarettes   Smokeless tobacco: Never   Tobacco comments:    Half pack daily going to try patches  Vaping  Use   Vaping status: Every Day   Substances: Nicotine, Flavoring  Substance and Sexual Activity   Alcohol use: Not Currently   Drug use: Yes    Types: Cocaine, Marijuana, Methamphetamines    Comment: MJ, Cocaine and Crystal Meth   Sexual activity: Not on file  Other Topics Concern   Not on file  Social History Narrative   Not on file   Social Determinants of Health   Financial Resource Strain: Low Risk  (06/02/2023)   Overall Financial Resource Strain (CARDIA)    Difficulty of Paying Living Expenses: Not hard at all  Food Insecurity: Patient Declined (05/31/2023)   Hunger Vital Sign    Worried About Running Out of Food in the Last Year: Patient declined    Ran Out of Food in the Last Year: Patient declined  Transportation Needs: No Transportation Needs (06/02/2023)   PRAPARE - Administrator, Civil Service (Medical): No    Lack of Transportation (Non-Medical): No  Recent Concern: Transportation Needs - Unmet Transportation Needs (05/17/2023)   Received from [facility] - Transportation    Lack of Transportation (Medical): Yes    Lack of Transportation (Non-Medical): Yes  Physical Activity: Unknown (05/17/2023)   Received from [facility]   Exercise Vital Sign    Days of Exercise per Week: 0 days    Minutes of Exercise per Session: Not on file  Stress: No Stress Concern Present (05/17/2023)   Received from [facility] - Occupational Stress Questionnaire    Feeling of Stress : Not at all  Social Connections: Socially Integrated (05/17/2023)   Received from [facility]   Social  Network    How would you rate your social network (family, work, friends)?: Good participation with social networks  Intimate Partner Violence: Patient Declined (05/31/2023)   Humiliation, Afraid, Rape, and Kick questionnaire    Fear of Current or Ex-Partner: Patient declined    Emotionally Abused: Patient declined    Physically Abused: Patient declined    Sexually Abused: Patient declined     No family history on file.  Vitals:   07/02/23 1110 07/02/23 1115  BP: 118/70   Pulse: 92   SpO2: 90% 94%  Weight: 104.8 kg (231 lb)   Height: 5\' 4"  (1.626 m)     PHYSICAL EXAM: General:  Obese, fatigued appearing. No respiratory difficulty HEENT: normal Neck: supple. JVD 9 cm. Carotids 2+ bilat; no bruits. No lymphadenopathy or thryomegaly appreciated. Cor: PMI nondisplaced. Irregularly irregular rhythm and rate. No rubs, gallops or murmurs. Lungs: decreased BS at the bases bilaterally  Abdomen: soft, nontender, distended. No hepatosplenomegaly. No bruits or masses. Good bowel sounds. Extremities: no cyanosis, clubbing, rash, 1-2+ b/l LE edema Neuro: alert & oriented x 3, cranial nerves grossly intact. moves all 4 extremities w/o difficulty. Affect pleasant.  ECG: Afib 102 bpm, personally reviewed    ASSESSMENT & PLAN:  1. 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 Cannot r/o ischemic component. Has known CAD s/p prior LAD stenting (previously followed at [facility]) but denies any recent chest pain  - Echo 6/22 EF 60-65%, RV normal. - Echo 4/24 EF 40-45%, diffuse HK, RV normal  (in Afib) - Echo 6/24 EF 35-40%, mod-severe MR, RV moderately reduced (in Afib) - Limited  Echo 10/24, EF 30-35%, global HK, severe LAE, mod-severe MR (in Afib) - NYHA Class II-III, confounded  by COPD, obesity and deconditioning  - Volume overloaded on exam, in setting of persistent AF - Increase torsemide to 60 mg qam/  40 mg qpm. BMP/BNP today  - Add Spiro 12.5 mg daily   - Continue Farxiga 10 mg daily  - will hold off on ARNi for now, given risk for hypotension w/ recent rapid afib  - start digoxin 0.125 mg daily - continue Toprol XL 100 mg daily  - imperative to get back in NSR. See below  - she is not a candidate for advanced therapies but at risk for further decompensation/ development of low output. Will need DCCV in the near future. I think she would be best suited to be followed by [facility], at least for the short term. Once stabilized, can consider referring her to general cardiology for ongoing care. Assign to Dr. Elwyn Lade.   2. Persistent Atrial Fibrillation  - complicating HF and MR - needs attempt at TEE/DCCV but needs further diuresis first (see above). Will try to arrange in 2 wks  - continue Toprol XL 100 mg daily  - continue Xarelto 20 mg daily  - amiodarone not ideal given COPD but may need to consider if she fails cardioversion  - start digoxin 0.125 mg daily. Check level in 1 wk   3. Mitral Regurgitation  - mod-severe on recent Echos - suspect functional MR c/b persistent afib  - HF and Afib optimization per above    4. CAD  - s/p LAD PCI + DES at [facility] 04/2019  - she denies any recent CP  - on ASA, statin + ? blocker   5. Chronic Hypoxic Respiratory Failure/COPD  - on Home O2 2L/min baseline. O2 sats 94%  - refer to  Pulm  - advised to quit smoking   5. Untreated OSA - reports prior diagnosis but not on CPAP  - needs to be treated. Referring to pulmonology     Referred to HFSW (PCP, Medications, Transportation, ETOH Abuse, Drug Abuse, Insurance, Financial ): No Refer to Pharmacy:  No Refer to Home Health: No Refer to Advanced Heart Failure Clinic: Yes (Dr. Elwyn Lade)  Refer to General Cardiology: No  Follow up  w/ Dr. Elwyn Lade in 1 wk to reassess volume status. Plan for TEE/DCCV ~2 wks   Robbie Lis, PA-C

## 2023-07-02 NOTE — Patient Instructions (Signed)
Medication Changes:  RESTART: DIGOXIN 0.125MG  ONCE DAILY   START: SPIRONOLACTONE 12.5MG  ONCE DAILY   INCREASE TORSEMIDE TO 60MG  IN THE MORNING AND 40MG  IN THE EVENING   Lab Work:  Labs done today, your results will be available in MyChart, we will contact you for abnormal readings.  Referrals:  YOU HAVE BEEN REFERRED TO PULMONOLOGY THEY WILL REACH OUT TO YOU OR CALL TO ARRANGE THIS. PLEASE CALL us WITH ANY CONCERNS   Follow-Up in: DECEMBER 3RD WITH DR. Elwyn Lade AT 10:20AM  At the Advanced [facility] Failure [facility], you and your health needs are our priority. We have a designated team specialized in the treatment of Heart Failure. This Care Team includes your primary Heart Failure Specialized Cardiologist (physician), Advanced Practice Providers (APPs- Physician Assistants and Nurse Practitioners), and Pharmacist who all work together to provide you with the care you need, when you need it.   You may see any of the following providers on your designated Care Team at your next follow up:  Dr. Arvilla Meres Dr. Marca Ancona Dr. Dorthula Nettles Dr. Theresia Bough Tonye Becket, NP Robbie Lis, [place] [facility] [place], [place] Brynda Peon, NP [place] Lee, NP Karle Plumber, PharmD   Please be sure to bring in all your medications bottles to every appointment.   Need to Contact us:  If you have any questions or concerns before your next appointment please send [place] a message through [place] or call our office at [phone].    TO LEAVE A MESSAGE FOR THE NURSE SELECT OPTION 2, PLEASE LEAVE A MESSAGE INCLUDING: YOUR NAME DATE OF BIRTH CALL BACK NUMBER REASON FOR CALL**this is important as we prioritize the call backs  YOU WILL RECEIVE A CALL BACK THE SAME DAY AS LONG AS YOU CALL BEFORE 4:00 PM

## 2023-07-12 ENCOUNTER — Telehealth (HOSPITAL_COMMUNITY): Payer: Self-pay | Admitting: Cardiology

## 2023-07-12 NOTE — Telephone Encounter (Signed)
Called patient at [phone] and at [phone]. Front office unable to leave a voicemail on either telephone number , front office called patient to remind her of her appointment tomorrow 07/13/2023 with Dr. Elwyn Lade.

## 2023-07-13 ENCOUNTER — Encounter (HOSPITAL_COMMUNITY): Payer: 59 | Admitting: Cardiology

## 2023-07-13 NOTE — Progress Notes (Incomplete)
   ADVANCED HEART FAILURE NEW PATIENT CLINIC NOTE  Referring Physician: Vivien Presto, MD  Primary Care: Corrington, Kip A, MD Primary Cardiologist:  HPI: NADIN PESCHKE is a 67 y.o. female with a PMH of  h/o systolic heart failure, CAD s/p prior DES to LAD, persistent>>permanent atrial fibrillation, mitral regurgitation and COPD  who presents for initial visit for further evaluation and treatment of heart failure/cardiomyopathy.      {Anything typed between these two boxes will persist and can be pulled forward to future notes. This phrase will delete itself when the note is signed :1}      SUBJECTIVE:   PMH, current medications, allergies, social history, and family history reviewed in epic.  PHYSICAL EXAM: There were no vitals filed for this visit. GENERAL: Well nourished and in no apparent distress at rest.  HEENT: The mucous membranes are pink and moist.   PULM:  Normal work of breathing, clear to auscultation bilaterally. Respirations are unlabored.  CARDIAC:  JVP: ***         Normal rate with regular rhythm. No murmurs, rubs or gallops.  *** edema.  ABDOMEN: Soft, non-tender, non-distended. NEUROLOGIC: Patient is oriented x3 with no focal or lateralizing neurologic deficits.  PSYCH: Patients affect is appropriate, there is no evidence of anxiety or depression.  SKIN: Warm and dry; no lesions or wounds. Warm and well perfused extremities.  DATA REVIEW  ECG: ***    ECHO: ***  CATH: ***    Heart failure review: - Classification: {HFCLASS:30917} - Etiology: {Cardiomyopathy:30918} - NYHA Class:  - Volume status: {volumechf:30919} - ACEi/ARB/ARNI: {HF:30752} - Aldosterone antagonist: {HF:30752} - Beta-blocker: {HF:30752} - Digoxin: {HF:30752} - Hydralazine/Nitrates: {HF:30752} - SGLT2i: {HF:30752} - GLP-1: {GLP:30906} - Advanced therapies: {Advancedtherapies:30916} - ICD: {ICD:30901}   ASSESSMENT & PLAN:  ***    Clearnce Hasten, MD Advanced  Heart Failure Mechanical Circulatory Support 07/13/23

## 2023-08-06 ENCOUNTER — Emergency Department (HOSPITAL_COMMUNITY): Payer: 59

## 2023-08-06 ENCOUNTER — Inpatient Hospital Stay (HOSPITAL_COMMUNITY)
Admission: EM | Admit: 2023-08-06 | Discharge: 2023-08-12 | DRG: 286 | Disposition: A | Discharge status: Home / Self Care | Payer: 59 | Source: Ambulatory Visit | Attending: Internal Medicine | Admitting: Internal Medicine

## 2023-08-06 ENCOUNTER — Other Ambulatory Visit: Payer: Self-pay

## 2023-08-06 ENCOUNTER — Ambulatory Visit (HOSPITAL_BASED_OUTPATIENT_CLINIC_OR_DEPARTMENT_OTHER)
Admission: RE | Admit: 2023-08-06 | Discharge: 2023-08-06 | Disposition: A | Discharge status: Home / Self Care | Payer: 59 | Source: Ambulatory Visit | Attending: Cardiology | Admitting: Cardiology

## 2023-08-06 ENCOUNTER — Encounter (HOSPITAL_COMMUNITY): Payer: Self-pay | Admitting: Student

## 2023-08-06 ENCOUNTER — Other Ambulatory Visit (HOSPITAL_COMMUNITY): Payer: Self-pay

## 2023-08-06 VITALS — BP 134/84 | HR 120 | Wt 217.0 lb

## 2023-08-06 DIAGNOSIS — I4891 Unspecified atrial fibrillation: Secondary | ICD-10-CM | POA: Insufficient documentation

## 2023-08-06 DIAGNOSIS — F17210 Nicotine dependence, cigarettes, uncomplicated: Secondary | ICD-10-CM | POA: Diagnosis present

## 2023-08-06 DIAGNOSIS — I429 Cardiomyopathy, unspecified: Secondary | ICD-10-CM | POA: Diagnosis present

## 2023-08-06 DIAGNOSIS — G4733 Obstructive sleep apnea (adult) (pediatric): Secondary | ICD-10-CM | POA: Insufficient documentation

## 2023-08-06 DIAGNOSIS — F32A Depression, unspecified: Secondary | ICD-10-CM | POA: Diagnosis present

## 2023-08-06 DIAGNOSIS — F419 Anxiety disorder, unspecified: Secondary | ICD-10-CM | POA: Diagnosis present

## 2023-08-06 DIAGNOSIS — I340 Nonrheumatic mitral (valve) insufficiency: Secondary | ICD-10-CM | POA: Diagnosis not present

## 2023-08-06 DIAGNOSIS — I2510 Atherosclerotic heart disease of native coronary artery without angina pectoris: Secondary | ICD-10-CM | POA: Diagnosis present

## 2023-08-06 DIAGNOSIS — M5432 Sciatica, left side: Secondary | ICD-10-CM

## 2023-08-06 DIAGNOSIS — J449 Chronic obstructive pulmonary disease, unspecified: Secondary | ICD-10-CM | POA: Insufficient documentation

## 2023-08-06 DIAGNOSIS — Z7982 Long term (current) use of aspirin: Secondary | ICD-10-CM

## 2023-08-06 DIAGNOSIS — E876 Hypokalemia: Secondary | ICD-10-CM | POA: Diagnosis present

## 2023-08-06 DIAGNOSIS — I5022 Chronic systolic (congestive) heart failure: Secondary | ICD-10-CM | POA: Insufficient documentation

## 2023-08-06 DIAGNOSIS — M797 Fibromyalgia: Secondary | ICD-10-CM | POA: Diagnosis present

## 2023-08-06 DIAGNOSIS — Z7901 Long term (current) use of anticoagulants: Secondary | ICD-10-CM

## 2023-08-06 DIAGNOSIS — Z7951 Long term (current) use of inhaled steroids: Secondary | ICD-10-CM

## 2023-08-06 DIAGNOSIS — Z9981 Dependence on supplemental oxygen: Secondary | ICD-10-CM

## 2023-08-06 DIAGNOSIS — I4821 Permanent atrial fibrillation: Principal | ICD-10-CM | POA: Diagnosis present

## 2023-08-06 DIAGNOSIS — I2721 Secondary pulmonary arterial hypertension: Secondary | ICD-10-CM | POA: Diagnosis present

## 2023-08-06 DIAGNOSIS — Z79899 Other long term (current) drug therapy: Secondary | ICD-10-CM | POA: Insufficient documentation

## 2023-08-06 DIAGNOSIS — Z5982 Transportation insecurity: Secondary | ICD-10-CM

## 2023-08-06 DIAGNOSIS — F17200 Nicotine dependence, unspecified, uncomplicated: Secondary | ICD-10-CM | POA: Insufficient documentation

## 2023-08-06 DIAGNOSIS — J9611 Chronic respiratory failure with hypoxia: Secondary | ICD-10-CM | POA: Diagnosis not present

## 2023-08-06 DIAGNOSIS — K219 Gastro-esophageal reflux disease without esophagitis: Secondary | ICD-10-CM | POA: Diagnosis present

## 2023-08-06 DIAGNOSIS — R600 Localized edema: Secondary | ICD-10-CM | POA: Insufficient documentation

## 2023-08-06 DIAGNOSIS — Z7984 Long term (current) use of oral hypoglycemic drugs: Secondary | ICD-10-CM | POA: Insufficient documentation

## 2023-08-06 DIAGNOSIS — Q2112 Patent foramen ovale: Secondary | ICD-10-CM | POA: Diagnosis not present

## 2023-08-06 DIAGNOSIS — M47816 Spondylosis without myelopathy or radiculopathy, lumbar region: Secondary | ICD-10-CM | POA: Diagnosis present

## 2023-08-06 DIAGNOSIS — E1165 Type 2 diabetes mellitus with hyperglycemia: Secondary | ICD-10-CM | POA: Diagnosis present

## 2023-08-06 DIAGNOSIS — Z955 Presence of coronary angioplasty implant and graft: Secondary | ICD-10-CM | POA: Insufficient documentation

## 2023-08-06 DIAGNOSIS — I513 Intracardiac thrombosis, not elsewhere classified: Secondary | ICD-10-CM | POA: Diagnosis present

## 2023-08-06 DIAGNOSIS — I5023 Acute on chronic systolic (congestive) heart failure: Secondary | ICD-10-CM | POA: Diagnosis present

## 2023-08-06 DIAGNOSIS — Z91128 Patient's intentional underdosing of medication regimen for other reason: Secondary | ICD-10-CM

## 2023-08-06 DIAGNOSIS — M51369 Other intervertebral disc degeneration, lumbar region without mention of lumbar back pain or lower extremity pain: Secondary | ICD-10-CM | POA: Diagnosis present

## 2023-08-06 DIAGNOSIS — I509 Heart failure, unspecified: Secondary | ICD-10-CM | POA: Diagnosis not present

## 2023-08-06 DIAGNOSIS — T45516A Underdosing of anticoagulants, initial encounter: Secondary | ICD-10-CM | POA: Diagnosis present

## 2023-08-06 DIAGNOSIS — E785 Hyperlipidemia, unspecified: Secondary | ICD-10-CM | POA: Diagnosis present

## 2023-08-06 DIAGNOSIS — Z881 Allergy status to other antibiotic agents status: Secondary | ICD-10-CM | POA: Diagnosis not present

## 2023-08-06 DIAGNOSIS — R0602 Shortness of breath: Secondary | ICD-10-CM | POA: Diagnosis present

## 2023-08-06 DIAGNOSIS — Z91148 Patient's other noncompliance with medication regimen for other reason: Secondary | ICD-10-CM

## 2023-08-06 DIAGNOSIS — F909 Attention-deficit hyperactivity disorder, unspecified type: Secondary | ICD-10-CM | POA: Diagnosis present

## 2023-08-06 DIAGNOSIS — E1151 Type 2 diabetes mellitus with diabetic peripheral angiopathy without gangrene: Secondary | ICD-10-CM | POA: Diagnosis present

## 2023-08-06 DIAGNOSIS — Z6837 Body mass index (BMI) 37.0-37.9, adult: Secondary | ICD-10-CM

## 2023-08-06 DIAGNOSIS — F17290 Nicotine dependence, other tobacco product, uncomplicated: Secondary | ICD-10-CM | POA: Diagnosis present

## 2023-08-06 LAB — CBC
HCT: 40.4 % (ref 36.0–46.0)
Hemoglobin: 13.2 g/dL (ref 12.0–15.0)
MCH: 27.2 pg (ref 26.0–34.0)
MCHC: 32.7 g/dL (ref 30.0–36.0)
MCV: 83.3 fL (ref 80.0–100.0)
Platelets: 287 10*3/uL (ref 150–400)
RBC: 4.85 MIL/uL (ref 3.87–5.11)
RDW: 15.2 % (ref 11.5–15.5)
WBC: 7.3 10*3/uL (ref 4.0–10.5)
nRBC: 0 % (ref 0.0–0.2)

## 2023-08-06 LAB — APTT: aPTT: 33 s (ref 24–36)

## 2023-08-06 LAB — BASIC METABOLIC PANEL
Anion gap: 12 (ref 5–15)
BUN: 14 mg/dL (ref 8–23)
CO2: 27 mmol/L (ref 22–32)
Calcium: 9.1 mg/dL (ref 8.9–10.3)
Chloride: 94 mmol/L — ABNORMAL LOW (ref 98–111)
Creatinine, Ser: 1.13 mg/dL — ABNORMAL HIGH (ref 0.44–1.00)
GFR, Estimated: 54 mL/min — ABNORMAL LOW (ref >60)
Glucose, Bld: 102 mg/dL — ABNORMAL HIGH (ref 70–99)
Potassium: 2.8 mmol/L — ABNORMAL LOW (ref 3.5–5.1)
Sodium: 133 mmol/L — ABNORMAL LOW (ref 135–145)

## 2023-08-06 LAB — BRAIN NATRIURETIC PEPTIDE: B Natriuretic Peptide: 238.2 pg/mL — ABNORMAL HIGH (ref 0.0–100.0)

## 2023-08-06 LAB — DIGOXIN LEVEL: Digoxin Level: 0.3 ng/mL — ABNORMAL LOW (ref 0.8–2.0)

## 2023-08-06 LAB — HEPARIN LEVEL (UNFRACTIONATED): Heparin Unfractionated: >1.1 [IU]/mL — ABNORMAL HIGH (ref 0.30–0.70)

## 2023-08-06 MED ORDER — ACETAMINOPHEN 325 MG PO TABS
650.0000 mg | ORAL_TABLET | Freq: Four times a day (QID) | ORAL | Status: DC | PRN
Start: 2023-08-06 — End: 2023-08-07

## 2023-08-06 MED ORDER — GABAPENTIN 300 MG PO CAPS
600.0000 mg | ORAL_CAPSULE | Freq: Three times a day (TID) | ORAL | Status: DC | PRN
  Administered 2023-08-07: 600 mg via ORAL
  Filled 2023-08-06: qty 2

## 2023-08-06 MED ORDER — BUDESON-GLYCOPYRROL-FORMOTEROL 160-9-4.8 MCG/ACT IN AERO: 2.0000 | INHALATION_SPRAY | Freq: Every day | RESPIRATORY_TRACT | Status: DC

## 2023-08-06 MED ORDER — DIGOXIN 125 MCG PO TABS
0.1250 mg | ORAL_TABLET | Freq: Every day | ORAL | Status: DC
  Administered 2023-08-07 – 2023-08-12 (×6): 0.125 mg via ORAL
  Filled 2023-08-06 (×6): qty 1

## 2023-08-06 MED ORDER — AMIODARONE HCL IN DEXTROSE 360-4.14 MG/200ML-% IV SOLN
30.0000 mg/h | INTRAVENOUS | Status: DC
  Administered 2023-08-07 – 2023-08-11 (×9): 30 mg/h via INTRAVENOUS
  Filled 2023-08-06 (×9): qty 200

## 2023-08-06 MED ORDER — AMIODARONE LOAD VIA INFUSION
150.0000 mg | Freq: Once | INTRAVENOUS | Status: AC
  Administered 2023-08-06: 150 mg via INTRAVENOUS
  Filled 2023-08-06: qty 83.34

## 2023-08-06 MED ORDER — SPIRONOLACTONE 12.5 MG HALF TABLET
25.0000 mg | ORAL_TABLET | Freq: Every day | ORAL | Status: DC
  Administered 2023-08-06: 25 mg via ORAL
  Filled 2023-08-06: qty 2

## 2023-08-06 MED ORDER — UMECLIDINIUM BROMIDE 62.5 MCG/ACT IN AEPB
1.0000 | INHALATION_SPRAY | Freq: Every day | RESPIRATORY_TRACT | Status: DC
  Administered 2023-08-07 – 2023-08-12 (×4): 1 via RESPIRATORY_TRACT
  Filled 2023-08-06: qty 7

## 2023-08-06 MED ORDER — OXYCODONE-ACETAMINOPHEN 5-325 MG PO TABS
1.0000 | ORAL_TABLET | Freq: Four times a day (QID) | ORAL | Status: DC | PRN
  Administered 2023-08-07 (×2): 1 via ORAL
  Filled 2023-08-06 (×2): qty 1

## 2023-08-06 MED ORDER — FUROSEMIDE 10 MG/ML IJ SOLN: 40.0000 mg | Freq: Once | INTRAMUSCULAR | Status: DC

## 2023-08-06 MED ORDER — METOPROLOL TARTRATE 5 MG/5ML IV SOLN: 5.0000 mg | Freq: Once | INTRAVENOUS | Status: DC

## 2023-08-06 MED ORDER — ARIPIPRAZOLE 5 MG PO TABS
5.0000 mg | ORAL_TABLET | Freq: Every day | ORAL | Status: DC
  Administered 2023-08-07 – 2023-08-12 (×6): 5 mg via ORAL
  Filled 2023-08-06 (×6): qty 1

## 2023-08-06 MED ORDER — METOPROLOL SUCCINATE ER 100 MG PO TB24
100.0000 mg | ORAL_TABLET | Freq: Every day | ORAL | Status: DC
  Administered 2023-08-07 – 2023-08-09 (×3): 100 mg via ORAL
  Filled 2023-08-06: qty 4
  Filled 2023-08-06 (×2): qty 1

## 2023-08-06 MED ORDER — PANTOPRAZOLE SODIUM 40 MG PO TBEC
40.0000 mg | DELAYED_RELEASE_TABLET | Freq: Every day | ORAL | Status: DC
  Administered 2023-08-07 – 2023-08-12 (×6): 40 mg via ORAL
  Filled 2023-08-06 (×7): qty 1

## 2023-08-06 MED ORDER — AMIODARONE HCL 200 MG PO TABS: 400.0000 mg | ORAL_TABLET | Freq: Two times a day (BID) | ORAL | 0 refills | Status: DC

## 2023-08-06 MED ORDER — AMIODARONE HCL IN DEXTROSE 360-4.14 MG/200ML-% IV SOLN
60.0000 mg/h | INTRAVENOUS | Status: AC
  Administered 2023-08-06: 60 mg/h via INTRAVENOUS
  Filled 2023-08-06: qty 200

## 2023-08-06 MED ORDER — ATORVASTATIN CALCIUM 80 MG PO TABS
80.0000 mg | ORAL_TABLET | Freq: Every day | ORAL | Status: DC
  Administered 2023-08-07 – 2023-08-12 (×6): 80 mg via ORAL
  Filled 2023-08-06 (×5): qty 1
  Filled 2023-08-06: qty 2

## 2023-08-06 MED ORDER — FUROSEMIDE 10 MG/ML IJ SOLN
120.0000 mg | Freq: Once | INTRAVENOUS | Status: AC
  Administered 2023-08-07: 120 mg via INTRAVENOUS
  Filled 2023-08-06: qty 2

## 2023-08-06 MED ORDER — FLUTICASONE FUROATE-VILANTEROL 100-25 MCG/ACT IN AEPB
1.0000 | INHALATION_SPRAY | Freq: Every day | RESPIRATORY_TRACT | Status: DC
  Administered 2023-08-07 – 2023-08-12 (×4): 1 via RESPIRATORY_TRACT
  Filled 2023-08-06: qty 28

## 2023-08-06 MED ORDER — ONDANSETRON HCL 4 MG/2ML IJ SOLN: 4.0000 mg | Freq: Four times a day (QID) | INTRAMUSCULAR | Status: DC | PRN

## 2023-08-06 MED ORDER — ASPIRIN 81 MG PO CHEW
81.0000 mg | CHEWABLE_TABLET | Freq: Every day | ORAL | Status: DC
  Administered 2023-08-06 – 2023-08-11 (×5): 81 mg via ORAL
  Filled 2023-08-06 (×5): qty 1

## 2023-08-06 MED ORDER — SENNOSIDES-DOCUSATE SODIUM 8.6-50 MG PO TABS
1.0000 | ORAL_TABLET | Freq: Every evening | ORAL | Status: DC | PRN
  Administered 2023-08-08: 1 via ORAL
  Filled 2023-08-06: qty 1

## 2023-08-06 MED ORDER — ONDANSETRON HCL 4 MG PO TABS: 4.0000 mg | ORAL_TABLET | Freq: Four times a day (QID) | ORAL | Status: DC | PRN

## 2023-08-06 MED ORDER — POTASSIUM CHLORIDE CRYS ER 20 MEQ PO TBCR
40.0000 meq | EXTENDED_RELEASE_TABLET | Freq: Once | ORAL | Status: AC
  Administered 2023-08-06: 40 meq via ORAL
  Filled 2023-08-06: qty 2

## 2023-08-06 MED ORDER — SPIRONOLACTONE 25 MG PO TABS
25.0000 mg | ORAL_TABLET | Freq: Every day | ORAL | Status: DC
  Administered 2023-08-07 – 2023-08-12 (×6): 25 mg via ORAL
  Filled 2023-08-06 (×5): qty 1
  Filled 2023-08-06: qty 2

## 2023-08-06 MED ORDER — HEPARIN (PORCINE) 25000 UT/250ML-% IV SOLN: 1250.0000 [IU]/h | INTRAVENOUS | Status: DC

## 2023-08-06 MED ORDER — OXYCODONE-ACETAMINOPHEN 5-325 MG PO TABS
1.0000 | ORAL_TABLET | Freq: Once | ORAL | Status: AC
  Administered 2023-08-06: 1 via ORAL
  Filled 2023-08-06: qty 1

## 2023-08-06 MED ORDER — DAPAGLIFLOZIN PROPANEDIOL 10 MG PO TABS
10.0000 mg | ORAL_TABLET | Freq: Every day | ORAL | Status: DC
  Administered 2023-08-07 – 2023-08-12 (×6): 10 mg via ORAL
  Filled 2023-08-06 (×6): qty 1

## 2023-08-06 MED ORDER — POTASSIUM CHLORIDE 10 MEQ/100ML IV SOLN: 10.0000 meq | INTRAVENOUS | Status: AC

## 2023-08-06 MED ORDER — HEPARIN (PORCINE) 25000 UT/250ML-% IV SOLN
1200.0000 [IU]/h | INTRAVENOUS | Status: DC
  Administered 2023-08-06: 1250 [IU]/h via INTRAVENOUS
  Administered 2023-08-07: 1350 [IU]/h via INTRAVENOUS
  Administered 2023-08-08 – 2023-08-09 (×2): 1200 [IU]/h via INTRAVENOUS
  Filled 2023-08-06 (×4): qty 250

## 2023-08-06 MED ORDER — FLUTICASONE PROPIONATE 50 MCG/ACT NA SUSP
2.0000 | Freq: Every day | NASAL | Status: DC | PRN
  Filled 2023-08-06: qty 16

## 2023-08-06 MED ORDER — ACETAMINOPHEN 650 MG RE SUPP: 650.0000 mg | Freq: Four times a day (QID) | RECTAL | Status: DC | PRN

## 2023-08-06 MED ORDER — METOPROLOL SUCCINATE ER 25 MG PO TB24: 100.0000 mg | ORAL_TABLET | Freq: Every day | ORAL | Status: DC

## 2023-08-06 NOTE — H&P (Signed)
History and Physical    Patient: Autumn BERTHELSEN WUJ:[id number] DOB: 02-25-1956 DOA: 08/06/2023 DOS: the patient was seen and examined on 08/06/2023 PCP: Corrington, Meredith Mody, MD  Patient coming from: Cardiology's office  Chief Complaint:  Chief Complaint  Patient presents with   Atrial Fibrillation   HPI: Autumn [name] is a 67 y.o. female with medical history significant of HFrEF, A-fib on Xarelto, attention deficit disorder, COPD, chronic hypoxic respiratory failure on 2 L [place], anxiety and depression, fibromyalgia, HLD, and CAD who presented from her cardiologist's office for evaluation of sciatic nerve pain.  Patient reports she has had low low back pain on and off for a long time but over the last week, it has been progressive and constant.  He has had a pain in her low back that shoots down her her left buttocks, hips and thighs.  States she has been walking hunched over using a cane due to the pain.  She denies any weakness, numbness or tingling, palpitations, dizziness, chest pain or shortness of breath.  She was seen by her cardiologist today to discuss management of her persistent A-fib and was advised to come to the ED due to worsening of her sciatic pain.    ED course: Initial vitals with temp 98.2, RR 17, HR 120, BP 134/84, SpO2 93% on room air, improved to 96% on 2 L [place]. Labs show sodium 133, K+ 2.8, creatinine 1.13, normal CBC, BNP 238, digoxin level 0.3, EKG shows A-fib with RVR and LVH CXR shows mild cardiomegaly but no acute airspace disease X-ray of the L-spine shows worsening degenerative changes at the L3-L4-L5 levels Hip x-ray shows bilateral hip degenerative changes but no acute abnormalities Cardiology was consulted for evaluation, recommended starting amiodarone drip, heparin drip and IV Lasix [facility] was consulted for admission  Review of Systems: As mentioned in the history of present illness. All other systems reviewed and are negative. Past Medical History:   Diagnosis Date   Anxiety    Arthritis    Attention deficit disorder (ADD)    COPD (chronic obstructive pulmonary disease) (HCC)    Depression    Drug abuse (HCC)    Fibromyalgia    HFrEF (heart failure with reduced ejection fraction) (HCC)    Hyperlipidemia    Paroxysmal Atrial fibrillation    Past Surgical History:  Procedure Laterality Date   ANKLE SURGERY     FACIAL COSMETIC SURGERY     Social History:  reports that she has been smoking cigarettes. She has a 10 pack-year smoking history. She has never used smokeless tobacco. She reports that she does not currently use alcohol. She reports current drug use. Drugs: Cocaine, Marijuana, and Methamphetamines.  Allergies  Allergen Reactions   Pamelor [Nortriptyline Hcl] Anaphylaxis   Vibra-Tab [Doxycycline] Other (See Comments)    Unknown reaction    No family history on file.  Prior to Admission medications   Medication Sig Start Date End Date Taking? Authorizing Provider  albuterol (VENTOLIN HFA) 108 (90 Base) MCG/ACT inhaler Inhale 2 puffs into the lungs every 4 (four) hours as needed for wheezing. 05/20/23  Yes [provider]  ARIPiprazole (ABILIFY) 5 MG tablet Take 1 tablet (5 mg total) by mouth daily. 06/10/23  Yes Lovie Macadamia, MD  aspirin (ASPIRIN CHILDRENS) 81 MG chewable tablet Chew 1 tablet (81 mg total) by mouth daily. 06/09/23 06/08/24 Yes Lovie Macadamia, MD  atorvastatin (LIPITOR) 80 MG tablet Take 1 tablet (80 mg total) by mouth daily. 06/10/23  Yes Youssefzadeh,  Ledell Noss, MD  Budeson-Glycopyrrol-Formoterol (BREZTRI AEROSPHERE) 160-9-4.8 MCG/ACT AERO Inhale 2 puffs into the lungs in the morning and at bedtime. 06/09/23  Yes Lovie Macadamia, MD  dapagliflozin propanediol (FARXIGA) 10 MG TABS tablet Take 1 tablet (10 mg total) by mouth daily. 06/10/23  Yes Lovie Macadamia, MD  digoxin (LANOXIN) 0.125 MG tablet Take 1 tablet (0.125 mg total) by mouth daily. 07/02/23  Yes Ahmed, Brittainy M, PA-C   fluticasone (FLONASE) 50 MCG/ACT nasal spray Place 2 sprays into both nostrils daily as needed for allergies. 02/23/22  Yes [provider]  gabapentin (NEURONTIN) 300 MG capsule Take 2 capsules (600 mg total) by mouth 3 (three) times daily as needed (Takes 2-3 as needed). Patient taking differently: Take 600 mg by mouth 3 (three) times daily as needed (for pain). 06/09/23  Yes Lovie Macadamia, MD  metoprolol succinate (TOPROL-XL) 100 MG 24 hr tablet Take 1 tablet (100 mg total) by mouth daily. Take with or immediately following a meal. 06/10/23  Yes Lovie Macadamia, MD  naloxone [facility]) nasal spray 4 mg/0.1 mL Place 4 mg into the nose as needed (overdose, as needed). 10/06/19  Yes [provider]  pantoprazole (PROTONIX) 40 MG tablet Take 1 tablet (40 mg total) by mouth daily. 06/10/23  Yes Lovie Macadamia, MD  rivaroxaban (XARELTO) 20 MG TABS tablet Take 1 tablet (20 mg total) by mouth daily with supper. 06/09/23  Yes Lovie Macadamia, MD  spironolactone (ALDACTONE) 25 MG tablet Take 0.5 tablets (12.5 mg total) by mouth daily. 07/02/23  Yes Haughton, Brittainy M, PA-C  torsemide (DEMADEX) 20 MG tablet TAKE 60MG  (3 TABLETS) IN THE MORNING AND 40MG  (2 TABLETS) IN THE EVENING 07/02/23  Yes Sharol Harness, Brittainy M, PA-C  amiodarone (PACERONE) 200 MG tablet Take 2 tablets (400 mg total) by mouth 2 (two) times daily. Patient not taking: Reported on 08/06/2023 08/06/23   Romie Minus, MD  Budeson-Glycopyrrol-Formoterol (BREZTRI AEROSPHERE) 160-9-4.8 MCG/ACT AERO Inhale 2 puffs into the lungs 2 (two) times daily. Patient not taking: Reported on 08/06/2023 02/23/22   [provider]  DULoxetine (CYMBALTA) 30 MG capsule Take 1 capsule (30 mg total) by mouth daily. Patient not taking: Reported on 08/06/2023 06/10/23   Lovie Macadamia, MD  nicotine (NICODERM CQ - DOSED IN MG/24 HOURS) 14 mg/24hr patch Place 1 patch (14 mg total) onto the skin daily. Patient not  taking: Reported on 08/06/2023 06/10/23   Lovie Macadamia, MD    Physical Exam: Vitals:   08/06/23 1300 08/06/23 1330 08/06/23 1521 08/06/23 1623  BP: (!) 138/103 121/74  138/80  Pulse: (!) 108 (!) 32  (!) 140  Resp: 17 16  16   Temp:    98 F (36.7 C)  TempSrc:    Oral  SpO2: 94% 91%  100%  Weight:   98.4 kg   Height:   5\' 4"  (1.626 m)    General: Well-appearing obese woman laying in bed. No acute distress. HEENT: [place]/AT. Anicteric sclera Neck: Supple. JVD to the midline CV: Tachycardic. Irregularly irregular rhythm. No murmurs, rubs, or gallops. 1+ BLE pitting edema. Pulmonary: Lungs CTAB. Normal effort. No wheezing or rales. Decreased breath sounds throughout. Abdominal: Soft, nontender, nondistended. Normal bowel sounds. Extremities: Palpable radial and DP pulses. Normal ROM. Skin: Warm and dry. No obvious rash or lesions. Neuro: A&Ox3. Moves all extremities. Normal sensation to light touch. No focal deficit. Psych: Labile emotions   Data Reviewed: Labs show sodium 133, K+ 2.8, creatinine 1.13, normal CBC, BNP 238, digoxin level 0.3, EKG shows  A-fib with RVR and LVH CXR shows mild cardiomegaly but no acute airspace disease X-ray of the L-spine shows worsening degenerative changes at the L3-L4-L5 levels Hip x-ray shows bilateral hip degenerative changes but no acute abnormalities  Assessment and Plan:  Autumn [name] is a 67 y.o. female with medical history significant of HFrEF, A-fib on Xarelto, attention deficit disorder, COPD, chronic hypoxic respiratory failure on 2 L [place], anxiety and depression, fibromyalgia, HLD, and CAD who presented from her cardiologist's office for evaluation of sciatic nerve pain and found to be in A-fib with RVR  # A-fib with RVR History of persistent A-fib with with previous cardioversion unsuccessful. Presented with heart rate in the 120s to 140s. She remains relatively asymptomatic. K+ down to 2.8. Cardiology plans to attempt TEE/DCCV once  patient has been diuresed appropriately. Admit to telemetry bed -Cardiology following, appreciate recs -Continue amiodarone gtt.and heparin gtt. -Continue Toprol XL and digoxin -Follow-up morning BMP, mag, Phos, TSH  # Acute on chronic systolic HF # Mitral regurg aortic TTE in 05/2023 showed EF 30-35%, global HK, severe LAE, mod-severe MR. BMP slightly elevated to the 200s. Patient slightly fluid overloaded on exam however CXR looks clear.  Likely CHF exacerbation in the setting of her persistent A-fib. -Cardiology following, appreciate recs -IV Lasix 120 mg x 1 today -Continue GDMT with spironolactone, Farxiga and Coreg -Daily weights, strict I&O -Will likely need eventual R/L heart cath per cardiology  # DDD Patient reports ongoing low back pain that often radiates down her left lower extremity.  X-ray shows degenerative changes of bilateral hip as well as worsening degenerative changes of the L3-L4-L5 levels. -Continue home gabapentin -As needed Tylenol and Percocet for pain -Check vitamin D levels  # Hypokalemia K+ down to 2.8 on admission. -Repleting however patient refusing meds per RN -Increase spironolactone to 25 mg daily per cardiology -Follow-up a.m. BMP, mag and Phos  # CAD Status post DES to LAD in 2020. She denies any chest pain. EKG without any acute ischemic changes. -Continue aspirin and atorvastatin  # COPD # Chronic hypoxic respiratory failure Patient on home 2 L [place] at baseline. -Continue Breztri -Continue supplemental O2  # OSA Patient not on CPAP therapy. -Outpatient follow-up with pulmonology  # GERD -Continue on Protonix   Advance Care Planning:   Code Status: Full Code   Consults: Cardiology  Family Communication: No family at bedside  Severity of Illness: The appropriate patient status for this patient is OBSERVATION. Observation status is judged to be reasonable and necessary in order to provide the required intensity of service to ensure  the patient's safety. The patient's presenting symptoms, physical exam findings, and initial radiographic and laboratory data in the context of their medical condition is felt to place them at decreased risk for further clinical deterioration. Furthermore, it is anticipated that the patient will be medically stable for discharge from the hospital within 2 midnights of admission.   Author: Steffanie Rainwater, MD 08/06/2023 4:46 PM  For on call review [email or web address].

## 2023-08-06 NOTE — Progress Notes (Signed)
ANTICOAGULATION CONSULT NOTE - Initial Consult  Pharmacy Consult for Heparin Indication: atrial fibrillation  Allergies  Allergen Reactions   Pamelor [Nortriptyline Hcl] Anaphylaxis   Vibra-Tab [Doxycycline] Other (See Comments)    Unknown reaction    Patient Measurements:   Heparin Dosing Weight: 77.4 kg  Vital Signs: Temp: 98.2 F (36.8 C) (12/27 1211) Temp Source: Oral (12/27 1211) BP: 121/74 (12/27 1330) Pulse Rate: 32 (12/27 1330)  Labs: Recent Labs    08/06/23 1218  HGB 13.2  HCT 40.4  PLT 287  CREATININE 1.13*    Estimated Creatinine Clearance: 55.8 mL/min (A) (by C-G formula based on SCr of 1.13 mg/dL (H)).   Medical History: Past Medical History:  Diagnosis Date   Anxiety    Arthritis    Attention deficit disorder (ADD)    COPD (chronic obstructive pulmonary disease) (HCC)    Depression    Drug abuse (HCC)    Fibromyalgia    HFrEF (heart failure with reduced ejection fraction) (HCC)    Hyperlipidemia    Paroxysmal Atrial fibrillation     Medications:  (Not in a hospital admission)  Scheduled:   amiodarone  150 mg Intravenous Once   [START ON 08/07/2023] metoprolol succinate  100 mg Oral Daily   oxyCODONE-acetaminophen  1 tablet Oral Once   [START ON 08/07/2023] spironolactone  25 mg Oral Daily   Infusions:   amiodarone     Followed by   amiodarone     furosemide     potassium chloride     PRN:   Assessment: 72 yof with a history of HF, CAD s/p prior DES to LAD, AF, mitral regurgitation, COPD. Patient is presenting with left-sided sciatic pain and AF. Heparin per pharmacy consult placed for atrial fibrillation.  Patient is on Xarelto prior to arrival. Last dose is unknown. Outpatient . Will require aPTT monitoring due to likely falsely high anti-Xa level secondary to DOAC use.  Hgb 13.2; plt 287  Goal of Therapy:  Heparin level 0.3-0.7 units/ml aPTT 66-102 seconds Monitor platelets by anticoagulation protocol: Yes   Plan:  No  initial heparin bolus Start heparin infusion at 1250 units/hr Check aPTT & anti-Xa level in 8 hours and daily while on heparin Continue to monitor via aPTT until levels are correlated Continue to monitor H&H and platelets  Delmar Landau, PharmD, BCPS 08/06/2023 3:16 PM ED Clinical Pharmacist -  [phone]

## 2023-08-06 NOTE — ED Notes (Signed)
[facility] telemetry notified for cardiac monitoring.   Pt agreed to place on cardiac monitoring and to administer medications.

## 2023-08-06 NOTE — ED Notes (Signed)
Patient wants to go home and follow up with her own outpatient cardiologist. Trying to get [facility] of hospitalist.

## 2023-08-06 NOTE — ED Provider Notes (Signed)
[place] EMERGENCY DEPARTMENT AT [facility] Provider Note   CSN: [id number] Arrival date & time: 08/06/23  1158     History  Chief Complaint  Patient presents with   Atrial Fibrillation    Autumn [name] is a 67 y.o. female.  HPI Patient was seen by cardiology today.  She has history of atrial fibrillation and congestive heart failure.  While being seen, patient reported significant left-sided sciatic pain as well and came to the emergency department for further evaluation.  Patient has elevated heart rate and blood pressure.  After discussion, she advises she has not been taking her medications regularly and missed multiple doses including her Xarelto.  Patient reports her main concern coming in is for pain she has been experiencing with left-sided sciatica.  Patient reports she has some problems off-and-on but this is worsened typical.  She reports over this past week she has been walking hunched over and using a cane.  He has pain in the buttock that radiates into the hip and the thigh.  He reports today is actually a little better and she was able to walk into the office for her cardiology appointment.  She reports however that she really needs something for pain.      Home Medications Prior to Admission medications   Medication Sig Start Date End Date Taking? Authorizing Provider  albuterol (VENTOLIN HFA) 108 (90 Base) MCG/ACT inhaler Inhale 2 puffs into the lungs every 4 (four) hours as needed for wheezing. 05/20/23  Yes [provider]  ARIPiprazole (ABILIFY) 5 MG tablet Take 1 tablet (5 mg total) by mouth daily. 06/10/23  Yes Lovie Macadamia, MD  aspirin (ASPIRIN CHILDRENS) 81 MG chewable tablet Chew 1 tablet (81 mg total) by mouth daily. 06/09/23 06/08/24 Yes Lovie Macadamia, MD  atorvastatin (LIPITOR) 80 MG tablet Take 1 tablet (80 mg total) by mouth daily. 06/10/23  Yes Lovie Macadamia, MD  Budeson-Glycopyrrol-Formoterol (BREZTRI  AEROSPHERE) 160-9-4.8 MCG/ACT AERO Inhale 2 puffs into the lungs in the morning and at bedtime. 06/09/23  Yes Lovie Macadamia, MD  dapagliflozin propanediol (FARXIGA) 10 MG TABS tablet Take 1 tablet (10 mg total) by mouth daily. 06/10/23  Yes Lovie Macadamia, MD  digoxin (LANOXIN) 0.125 MG tablet Take 1 tablet (0.125 mg total) by mouth daily. 07/02/23  Yes Scotto, Brittainy M, PA-C  fluticasone (FLONASE) 50 MCG/ACT nasal spray Place 2 sprays into both nostrils daily as needed for allergies. 02/23/22  Yes [provider]  gabapentin (NEURONTIN) 300 MG capsule Take 2 capsules (600 mg total) by mouth 3 (three) times daily as needed (Takes 2-3 as needed). Patient taking differently: Take 600 mg by mouth 3 (three) times daily as needed (for pain). 06/09/23  Yes Lovie Macadamia, MD  metoprolol succinate (TOPROL-XL) 100 MG 24 hr tablet Take 1 tablet (100 mg total) by mouth daily. Take with or immediately following a meal. 06/10/23  Yes Lovie Macadamia, MD  naloxone [facility]) nasal spray 4 mg/0.1 mL Place 4 mg into the nose as needed (overdose, as needed). 10/06/19  Yes [provider]  pantoprazole (PROTONIX) 40 MG tablet Take 1 tablet (40 mg total) by mouth daily. 06/10/23  Yes Lovie Macadamia, MD  rivaroxaban (XARELTO) 20 MG TABS tablet Take 1 tablet (20 mg total) by mouth daily with supper. 06/09/23  Yes Lovie Macadamia, MD  spironolactone (ALDACTONE) 25 MG tablet Take 0.5 tablets (12.5 mg total) by mouth daily. 07/02/23  Yes Watt, Brittainy M, PA-C  torsemide (DEMADEX) 20 MG tablet TAKE  60MG  (3 TABLETS) IN THE MORNING AND 40MG  (2 TABLETS) IN THE EVENING 07/02/23  Yes Robbie Lis M, PA-C  amiodarone (PACERONE) 200 MG tablet Take 2 tablets (400 mg total) by mouth 2 (two) times daily. Patient not taking: Reported on 08/06/2023 08/06/23   Romie Minus, MD  Budeson-Glycopyrrol-Formoterol (BREZTRI AEROSPHERE) 160-9-4.8 MCG/ACT AERO Inhale 2 puffs into the lungs  2 (two) times daily. Patient not taking: Reported on 08/06/2023 02/23/22   [provider]  DULoxetine (CYMBALTA) 30 MG capsule Take 1 capsule (30 mg total) by mouth daily. Patient not taking: Reported on 08/06/2023 06/10/23   Lovie Macadamia, MD  nicotine (NICODERM CQ - DOSED IN MG/24 HOURS) 14 mg/24hr patch Place 1 patch (14 mg total) onto the skin daily. Patient not taking: Reported on 08/06/2023 06/10/23   Lovie Macadamia, MD      Allergies    Pamelor [nortriptyline hcl] and Vibra-tab [doxycycline]    Review of Systems   Review of Systems  Physical Exam Updated Vital Signs BP 121/74   Pulse (!) 32   Temp 98.2 F (36.8 C) (Oral)   Resp 16   Ht 5\' 4"  (1.626 m)   Wt 98.4 kg   SpO2 91%   BMI 37.24 kg/m  Physical Exam Constitutional:      Comments: Alert.  Mild to moderate increased work of breathing at rest.  Central obesity.  HENT:     Mouth/Throat:     Pharynx: Oropharynx is clear.  Eyes:     Extraocular Movements: Extraocular movements intact.  Cardiovascular:     Rate and Rhythm: Tachycardia present. Rhythm irregular.  Pulmonary:     Comments: With increased work of breathing.  Crackles bilaterally. Abdominal:     General: There is no distension.     Palpations: Abdomen is soft.     Tenderness: There is no abdominal tenderness. There is no guarding.  Musculoskeletal:     Comments: 2-3+ pitting edema bilaterally.  Patient is able to elevate the left lower extremity off of the bed.  She can push against resistance and we can perform some flexion at the hip.  Skin:    General: Skin is warm and dry.  Neurological:     General: No focal deficit present.     Mental Status: She is oriented to person, place, and time.     Motor: No weakness.     Coordination: Coordination normal.     ED Results / Procedures / Treatments   Labs (all labs ordered are listed, but only abnormal results are displayed) Labs Reviewed  BASIC METABOLIC PANEL - Abnormal;  Notable for the following components:      Result Value   Sodium 133 (*)    Potassium 2.8 (*)    Chloride 94 (*)    Glucose, Bld 102 (*)    Creatinine, Ser 1.13 (*)    GFR, Estimated 54 (*)    All other components within normal limits  CBC  BRAIN NATRIURETIC PEPTIDE  DIGOXIN LEVEL  HEPARIN LEVEL (UNFRACTIONATED)  HEPARIN LEVEL (UNFRACTIONATED)  APTT  APTT    EKG EKG Interpretation Date/Time:  Friday August 06 2023 11:47:47 EST Ventricular Rate:  140 PR Interval:    QRS Duration:  90 QT Interval:  336 QTC Calculation: 512 R Axis:   54  Text Interpretation: Atrial fibrillation with rapid ventricular response Nonspecific ST abnormality Abnormal ECG When compared with ECG of 02-Jul-2023 11:22, ST now depressed in Inferior leads agree Confirmed by Arby Barrette [phone]) on  08/06/2023 4:16:32 PM  Radiology DG Chest Port 1 View Result Date: 08/06/2023 CLINICAL DATA:  Atrial fibrillation EXAM: PORTABLE CHEST 1 VIEW COMPARISON:  05/31/2023 FINDINGS: Single frontal view of the chest demonstrates mild enlargement the cardiac silhouette. No airspace disease, effusion, or pneumothorax. No acute bony abnormalities. IMPRESSION: 1. Mild enlargement of the cardiac silhouette. 2. No acute airspace disease. Electronically Signed   By: Sharlet Salina M.D.   On: 08/06/2023 15:15    Procedures Procedures   CRITICAL CARE Performed by: Arby Barrette   Total critical care time: 30 minutes  Critical care time was exclusive of separately billable procedures and treating other patients.  Critical care was necessary to treat or prevent imminent or life-threatening deterioration.  Critical care was time spent personally by me on the following activities: development of treatment plan with patient and/or surrogate as well as nursing, discussions with consultants, evaluation of patient's response to treatment, examination of patient, obtaining history from patient or surrogate, ordering and  performing treatments and interventions, ordering and review of laboratory studies, ordering and review of radiographic studies, pulse oximetry and re-evaluation of patient's condition.  Medications Ordered in ED Medications  potassium chloride 10 mEq in 100 mL IVPB (10 mEq Intravenous Patient Refused/Not Given 08/06/23 1547)  furosemide (LASIX) 120 mg in dextrose 5 % 50 mL IVPB (has no administration in time range)  amiodarone (NEXTERONE) 1.8 mg/mL load via infusion 150 mg (has no administration in time range)    Followed by  amiodarone (NEXTERONE PREMIX) 360-4.14 MG/200ML-% (1.8 mg/mL) IV infusion (has no administration in time range)    Followed by  amiodarone (NEXTERONE PREMIX) 360-4.14 MG/200ML-% (1.8 mg/mL) IV infusion (has no administration in time range)  spironolactone (ALDACTONE) tablet 25 mg (has no administration in time range)  metoprolol succinate (TOPROL-XL) 24 hr tablet 100 mg (has no administration in time range)  heparin ADULT infusion 100 units/mL (25000 units/276mL) (has no administration in time range)  potassium chloride SA (KLOR-CON M) CR tablet 40 mEq (40 mEq Oral Given 08/06/23 1450)  oxyCODONE-acetaminophen (PERCOCET/ROXICET) 5-325 MG per tablet 1 tablet (1 tablet Oral Given 08/06/23 1551)    ED Course/ Medical Decision Making/ A&P                                 Medical Decision Making Amount and/or Complexity of Data Reviewed Labs: ordered. Radiology: ordered.  Risk Prescription drug management. Decision regarding hospitalization.   Presents as outlined.  She was seen at heart care today.  Currently main issues appear to be related to atrial fibrillation and lack of medication compliance.  Patient's heart rate is in the 140s.  She appears significantly volume overloaded with significant peripheral edema and crackles.  Patient's potassium has returned at 2.8.  At this time we will opt to initiate treatment for this.  Patient is given Lasix 40 mg IV, oral  potassium and IV potassium initiated, Lopressor IV administered.  Discussed anticoagulation with Eyvonne Left for cardiology.  At this time will opt for heparin as patient's been noncompliant with Xarelto and can facilitate possible cardioversion and other management as needed.  He requests admission to hospitalist service.  Patient has concern for sciatica.  On examination and history findings are consistent with sciatica without any neurologic dysfunction.  Patient is otherwise neurovascularly intact.  Will give Percocet for pain control.  Will obtain basic imaging with LS-spine and hip plain film.  Currently I do not  feel that patient emergently needs MRI given intact neurologic function.  Consult: [facility] hospitalist consult for admission.        Final Clinical Impression(s) / ED Diagnoses Final diagnoses:  Atrial fibrillation with RVR (HCC)  Acute on chronic congestive heart failure, unspecified heart failure type (HCC)  Sciatica of left side  Noncompliance w/medication treatment due to intermit use of medication  Hypokalemia    Rx / DC Orders ED Discharge Orders     None         Arby Barrette, MD 08/06/23 [phone]

## 2023-08-06 NOTE — ED Notes (Signed)
Patient had agreed to stay overnight, now patient is wanting to leave again. Notifying MD.

## 2023-08-06 NOTE — ED Notes (Signed)
MD made aware of patient's refusal of meds

## 2023-08-06 NOTE — ED Triage Notes (Signed)
Pt c/o L lower back pain radiating down her leg for approximately one week. Pt has hx of sciatica and states that pain is similar. Denies saddle anesthesia, no bowel/bladder incontinence. Pt also states that she is in a-fib, which is her baseline. Denies palpitations, CP, SOB.

## 2023-08-06 NOTE — Patient Instructions (Signed)
START Amiodarone 400 mg Twice daily   Please report to the ER for further evaluation

## 2023-08-06 NOTE — Progress Notes (Signed)
ADVANCED HEART FAILURE NEW PATIENT CLINIC NOTE  Referring Physician: Vivien Presto, MD  Primary Care: Corrington, Kip A, MD Primary Cardiologist:  HPI: Autumn [name] is a 67 y.o. female with a PMH of  h/o systolic heart failure, CAD s/p prior DES to LAD, persistent>>permanent atrial fibrillation, mitral regurgitation and COPD  who presents for initial visit for further evaluation and treatment of heart failure/cardiomyopathy.      Echo 6/22 EF 60-65%, RV normal.   Admitted 4/24 for acute CHF. Echo showed drop in EF down to 40-45%, diffuse HK, RV normal, calcified/degenerative mitral valve w/ mild MR. Of note, she was reported to be in afib w/ RVR during time of study, w/ v-rates in the 120s-130s. She was diuresed w/ IV Lasix and metoprolol was increased for rate control of afib.    Readmitted again 6/24 for symptomatic afib/ w/ RVR. Limited Echo EF further reduced down to 35-40%, mod-severe MR, RV moderately reduced, RVSP 43 mmHg. Also treated for a/c CHF. Diuresed and rate control regimen further titrated.    Readmitted again 7/24 for afib w/ RVR and a/c CHF. Repeat Limited Echo 10/24, EF 30-35%, global HK, severe LAE, mod-severe MR, small pericardial effusion localized near RA but no evidence of tamponade, mod TR. Digoxin was added to regimen. Adequate rate control was noted to be achieved prior to d/c.    Readmitted again 10/24 for the same, a/c CHF and afib w/ RVR. Admitted by internal medicine. Cardiology not consulted. Diuresed w/ IV Lasix and transitioned to oral torsemide. Digoxin was discontinued on discharge summary, though not well outlined why. Renal fx was ok. Continued on high dose Toprol XL 100 mg daily and referred to [facility] clinic at d/c.      SUBJECTIVE: Patient unfortunately complaining of severe sciatic pain today that has gotten worse over the last week.  She of her notes that the pain radiates down her left leg.  She still has intact bowel function as well as  sensation but would like to be evaluated for her ongoing pain.  She has been taking gabapentin without improvement.  She reports that she has had some improvement in her breathing since she was last seen in heart failure clinic, and is lost a significant amount of weight.  PMH, current medications, allergies, social history, and family history reviewed in epic.  PHYSICAL EXAM: Vitals:   08/06/23 1123  BP: 134/84  Pulse: (!) 120  SpO2: 93%   GENERAL: Chronically ill ill-appearing, in mild respiratory distress, tearful HEENT: The mucous membranes are pink and moist.   PULM: Mildly increased work of breathing CARDIAC:  JVP: Moderately elevated         Tachycardic, regular rhythm, 1+ lower extremity edema ABDOMEN: Soft, non-tender, mild distention NEUROLOGIC: Patient is oriented x3 with no focal or lateralizing neurologic deficits.  PSYCH: Patients affect is appropriate, there is no evidence of anxiety or depression.  SKIN: Warm and dry; no lesions or wounds. Warm and well perfused extremities.   ASSESSMENT & PLAN:  Sciatica pain: Without red flag symptoms, however pain is severely limiting and patient would like to be evaluated in the emergency department.  Will arrange. -Patient to the emergency department for evaluation  Chronic systolic heart failure: Known ischemic disease, LAD PCI in 2010 but does not appear to have had evaluation since.  Likely an element of tachy mediated as well, unsuccessful cardioversion in 2022.  She is in atrial fibrillation with RVR today, though volume status is improved.  Management of atrial fibrillation as below, will continue on diuretic therapy. -Continue spironolactone 12.5 mg daily, torsemide 60/40, digoxin 0.125mg , metoprolol XL 100 mg daily, Farxiga 10 mg daily -If admitted would benefit from digoxin level tomorrow morning -Management of atrial fibrillation as below -Will eventually likely need right/left heart catheterization  Atrial fibrillation  with RVR: Likely longstanding persistent, would benefit from attempted restoration of sinus rhythm.  Even though she has COPD, given her longstanding disease and increased left atrial size will start on amiodarone in an attempt to increase chance at successful conversion.  If she is admitted, would consider while inpatient.  Otherwise we will arrange for outpatient TEE/DCCV given missed doses of anticoagulation. -Continue diuresis as above -Continue metoprolol XL 100 mg daily -Continue Xarelto 20 mg daily, admits to missing doses -Start amiodarone 400 mg twice daily -Digoxin level -Potential TEE/DCCV  Mitral Regurgitation  - mod-severe on recent Echos - suspect functional MR c/b persistent afib  - HF and Afib optimization per above    CAD  - s/p LAD PCI + DES at [facility] 04/2019  - she denies any recent CP  - on ASA, statin + ? blocker   Chronic Hypoxic Respiratory Failure/COPD  - on Home O2 2L/min baseline. O2 sats 94%  - referred to [employer]  - advised to quit smoking    Untreated OSA - reports prior diagnosis but not on CPAP    Clearnce Hasten, MD Advanced Heart Failure Mechanical Circulatory Support 08/06/23

## 2023-08-06 NOTE — Consult Note (Signed)
Advanced Heart Failure Team Consult Note   Primary Physician: Corrington, Meredith Mody, MD Cardiologist:  Thomasene Ripple, DO AHFC: Dr. Elwyn Lade   Reason for Consultation: acute on chronic systolic heart failure  HPI:    Autumn [name] is seen today for evaluation of acute on chronic systolic heart failure at the request of Dr. Donnald Garre, Emergency Medicine.   67 y/o female w/ h/o systolic heart failure, CAD s/p prior DES to LAD, persistent>>permanent atrial fibrillation, mitral regurgitation and COPD. Followed primarily in the past by [facility]. Admitted several times at [facility] and also seen previously in [facility].    Echo 6/22 EF 60-65%, RV normal.   Admitted 4/24 for acute CHF. Echo showed drop in EF down to 40-45%, diffuse HK, RV normal, calcified/degenerative mitral valve w/ mild MR. Of note, she was reported to be in afib w/ RVR during time of study, w/ v-rates in the 120s-130s. She was diuresed w/ IV Lasix and metoprolol was increased for rate control of afib.    Readmitted again 6/24 for symptomatic afib/ w/ RVR. Limited Echo EF further reduced down to 35-40%, mod-severe MR, RV moderately reduced, RVSP 43 mmHg. Also treated for a/c CHF. Diuresed and rate control regimen further titrated.    Readmitted again 7/24 for afib w/ RVR and a/c CHF. Repeat Limited Echo 10/24, EF 30-35%, global HK, severe LAE, mod-severe MR, small pericardial effusion localized near RA but no evidence of tamponade, mod TR. Digoxin was added to regimen. Adequate rate control was noted to be achieved prior to d/c.    Readmitted again 10/24 for the same, a/c CHF and afib w/ RVR. Admitted by internal medicine. Cardiology not consulted. Diuresed w/ IV Lasix and transitioned to oral torsemide. Digoxin was discontinued on discharge summary, though not well outlined why. Renal fx was ok. Continued on high dose Toprol XL 100 mg daily and referred to [facility] clinic at d/c. From [facility] clinic, she was referred to [facility]  for further HF management.   She presented today to establish care w/ Dr. Elwyn Lade. Was volume overloaded on exam, in setting of persistent AF w/ NYHA Class II-III symptoms. Plan was to up titrate home diuretics and GDMT and plan for outpatient DCCV once better diuresed, however given severe back and leg pain due to her sciatica, pt went to the ED for evaluation.   Seen by EDP. Plan for admission. AHF team asked to assist w/ HF management.      Home Medications Prior to Admission medications   Medication Sig Start Date End Date Taking? Authorizing Provider  albuterol (VENTOLIN HFA) 108 (90 Base) MCG/ACT inhaler Inhale 2 puffs into the lungs every 4 (four) hours as needed for wheezing. 05/20/23  Yes [provider]  ARIPiprazole (ABILIFY) 5 MG tablet Take 1 tablet (5 mg total) by mouth daily. 06/10/23  Yes Lovie Macadamia, MD  aspirin (ASPIRIN CHILDRENS) 81 MG chewable tablet Chew 1 tablet (81 mg total) by mouth daily. 06/09/23 06/08/24 Yes Lovie Macadamia, MD  atorvastatin (LIPITOR) 80 MG tablet Take 1 tablet (80 mg total) by mouth daily. 06/10/23  Yes Lovie Macadamia, MD  Budeson-Glycopyrrol-Formoterol (BREZTRI AEROSPHERE) 160-9-4.8 MCG/ACT AERO Inhale 2 puffs into the lungs in the morning and at bedtime. 06/09/23  Yes Lovie Macadamia, MD  dapagliflozin propanediol (FARXIGA) 10 MG TABS tablet Take 1 tablet (10 mg total) by mouth daily. 06/10/23  Yes Lovie Macadamia, MD  digoxin (LANOXIN) 0.125 MG tablet Take 1 tablet (0.125 mg total) by mouth  daily. 07/02/23  Yes Sharol Harness, Shiquita Collignon M, PA-C  fluticasone (FLONASE) 50 MCG/ACT nasal spray Place 2 sprays into both nostrils daily as needed for allergies. 02/23/22  Yes [provider]  gabapentin (NEURONTIN) 300 MG capsule Take 2 capsules (600 mg total) by mouth 3 (three) times daily as needed (Takes 2-3 as needed). Patient taking differently: Take 600 mg by mouth 3 (three) times daily as needed (for pain). 06/09/23  Yes  Lovie Macadamia, MD  metoprolol succinate (TOPROL-XL) 100 MG 24 hr tablet Take 1 tablet (100 mg total) by mouth daily. Take with or immediately following a meal. 06/10/23  Yes Lovie Macadamia, MD  naloxone [facility]) nasal spray 4 mg/0.1 mL Place 4 mg into the nose as needed (overdose, as needed). 10/06/19  Yes [provider]  pantoprazole (PROTONIX) 40 MG tablet Take 1 tablet (40 mg total) by mouth daily. 06/10/23  Yes Lovie Macadamia, MD  rivaroxaban (XARELTO) 20 MG TABS tablet Take 1 tablet (20 mg total) by mouth daily with supper. 06/09/23  Yes Lovie Macadamia, MD  spironolactone (ALDACTONE) 25 MG tablet Take 0.5 tablets (12.5 mg total) by mouth daily. 07/02/23  Yes Anastazia Creek M, PA-C  torsemide (DEMADEX) 20 MG tablet TAKE 60MG  (3 TABLETS) IN THE MORNING AND 40MG  (2 TABLETS) IN THE EVENING 07/02/23  Yes Sharol Harness, Nussen Pullin M, PA-C  amiodarone (PACERONE) 200 MG tablet Take 2 tablets (400 mg total) by mouth 2 (two) times daily. Patient not taking: Reported on 08/06/2023 08/06/23   Romie Minus, MD  Budeson-Glycopyrrol-Formoterol (BREZTRI AEROSPHERE) 160-9-4.8 MCG/ACT AERO Inhale 2 puffs into the lungs 2 (two) times daily. Patient not taking: Reported on 08/06/2023 02/23/22   [provider]  DULoxetine (CYMBALTA) 30 MG capsule Take 1 capsule (30 mg total) by mouth daily. Patient not taking: Reported on 08/06/2023 06/10/23   Lovie Macadamia, MD  nicotine (NICODERM CQ - DOSED IN MG/24 HOURS) 14 mg/24hr patch Place 1 patch (14 mg total) onto the skin daily. Patient not taking: Reported on 08/06/2023 06/10/23   Lovie Macadamia, MD    Past Medical History: Past Medical History:  Diagnosis Date   Anxiety    Arthritis    Attention deficit disorder (ADD)    COPD (chronic obstructive pulmonary disease) (HCC)    Depression    Drug abuse (HCC)    Fibromyalgia    HFrEF (heart failure with reduced ejection fraction) (HCC)    Hyperlipidemia    Paroxysmal  Atrial fibrillation     Past Surgical History: Past Surgical History:  Procedure Laterality Date   ANKLE SURGERY     FACIAL COSMETIC SURGERY      Family History: No family history on file.  Social History: Social History   Socioeconomic History   Marital status: Divorced    Spouse name: Not on file   Number of children: 3   Years of education: Not on file   Highest education level: High school graduate  Occupational History   Occupation: disabled  Tobacco Use   Smoking status: Every Day    Current packs/day: 0.50    Average packs/day: 0.5 packs/day for 20.0 years (10.0 ttl pk-yrs)    Types: Cigarettes   Smokeless tobacco: Never   Tobacco comments:    Half pack daily going to try patches  Vaping Use   Vaping status: Every Day   Substances: Nicotine, Flavoring  Substance and Sexual Activity   Alcohol use: Not Currently   Drug use: Yes    Types: Cocaine, Marijuana, Methamphetamines  Comment: MJ, Cocaine and Crystal Meth   Sexual activity: Not on file  Other Topics Concern   Not on file  Social History Narrative   Not on file   Social Drivers of Health   Financial Resource Strain: Low Risk  (06/02/2023)   Overall Financial Resource Strain (CARDIA)    Difficulty of Paying Living Expenses: Not hard at all  Food Insecurity: Patient Declined (05/31/2023)   Hunger Vital Sign    Worried About Running Out of Food in the Last Year: Patient declined    Ran Out of Food in the Last Year: Patient declined  Transportation Needs: No Transportation Needs (06/02/2023)   PRAPARE - Administrator, Civil Service (Medical): No    Lack of Transportation (Non-Medical): No  Recent Concern: Transportation Needs - Unmet Transportation Needs (05/17/2023)   Received from [facility] - Transportation    Lack of Transportation (Medical): Yes    Lack of Transportation (Non-Medical): Yes  Physical Activity: Unknown (05/17/2023)   Received from [facility]    Exercise Vital Sign    Days of Exercise per Week: 0 days    Minutes of Exercise per Session: Not on file  Stress: No Stress Concern Present (05/17/2023)   Received from [facility] Occupational Health - Occupational Stress Questionnaire    Feeling of Stress : Not at all  Social Connections: Socially Integrated (05/17/2023)   Received from [facility]   Social Network    How would you rate your social network (family, work, friends)?: Good participation with social networks    Allergies:  Allergies  Allergen Reactions   Pamelor [Nortriptyline Hcl] Anaphylaxis   Vibra-Tab [Doxycycline] Other (See Comments)    Unknown reaction    Objective:    Vital Signs:   Temp:  [98.2 F (36.8 C)] 98.2 F (36.8 C) (12/27 1211) Pulse Rate:  [32-128] 32 (12/27 1330) Resp:  [16-22] 16 (12/27 1330) BP: (121-138)/(74-103) 121/74 (12/27 1330) SpO2:  [91 %-95 %] 91 % (12/27 1330)    Weight change: There were no vitals filed for this visit.  Intake/Output:  No intake or output data in the 24 hours ending 08/06/23 1510    Physical Exam    General:  obese, fatigued appearing F, looks older than actual age. No resp difficulty HEENT: normal Neck: supple. JVP 9 cm. Carotids 2+ bilat; no bruits. No lymphadenopathy or thyromegaly appreciated. Cor: PMI nondisplaced. Irreguarlly irregular rhythm and rate. No rubs, gallops or murmurs. Lungs: decreased BS at the bases bilaterally  Abdomen: soft, nontender, +distended. No hepatosplenomegaly. No bruits or masses. Good bowel sounds. Extremities: no cyanosis, clubbing, rash, 1-2+ b/l LE edema Neuro: alert & orientedx3, cranial nerves grossly intact. moves all 4 extremities w/o difficulty. Affect pleasant   Telemetry   Atrial fibrillation, V-rates low 100s-110s  EKG    Afib 102 bpm   Labs   Basic Metabolic Panel: Recent Labs  Lab 08/06/23 1218  NA 133*  K 2.8*  CL 94*  CO2 27  GLUCOSE 102*  BUN 14  CREATININE  1.13*  CALCIUM 9.1    Liver Function Tests: No results for input(s): "AST", "ALT", "ALKPHOS", "BILITOT", "PROT", "ALBUMIN" in the last 168 hours. No results for input(s): "LIPASE", "AMYLASE" in the last 168 hours. No results for input(s): "AMMONIA" in the last 168 hours.  CBC: Recent Labs  Lab 08/06/23 1218  WBC 7.3  HGB 13.2  HCT 40.4  MCV 83.3  PLT 287  Cardiac Enzymes: No results for input(s): "CKTOTAL", "CKMB", "CKMBINDEX", "TROPONINI" in the last 168 hours.  BNP: BNP (last 3 results) Recent Labs    02/17/23 0845 05/31/23 0712 07/02/23 1153  BNP 1,231.2* 606.9* 411.5*    ProBNP (last 3 results) No results for input(s): "PROBNP" in the last 8760 hours.   CBG: No results for input(s): "GLUCAP" in the last 168 hours.  Coagulation Studies: No results for input(s): "LABPROT", "INR" in the last 72 hours.   Imaging   No results found.   Medications:     Current Medications:  furosemide  40 mg Intravenous Once   metoprolol tartrate  5 mg Intravenous Once   oxyCODONE-acetaminophen  1 tablet Oral Once    Infusions:  potassium chloride        Patient Profile   67 y/o female w/ h/o systolic heart failure, CAD s/p prior DES to LAD, persistent atrial fibrillation, mitral regurgitation, COPD and frequent recent admissions for CHF, being admitted for debilitating sciatic nerve pain and a/c CHF in the setting of persistent atrial fibrillation.   Assessment/Plan   1. 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 Cannot r/o ischemic component. Has known CAD s/p prior LAD stenting (previously followed at [facility]) but denies any recent chest pain  - Echo 6/22 EF 60-65%, RV normal. - Echo 4/24 EF 40-45%, diffuse HK, RV normal  (in Afib) - Echo 6/24 EF 35-40%, mod-severe MR, RV moderately reduced (in Afib) - Limited  Echo 10/24, EF 30-35%, global HK, severe LAE,  mod-severe MR (in Afib) - NYHA Class II-III, confounded by COPD, obesity and deconditioning  - Volume overloaded on exam, in setting of persistent AF - Diurese w/ IV Lasix 120 mg x 1 and follow response  - Increase Spiro to 25 mg daily  - Continue Farxiga 10 mg daily  - plan eventual ARB/ARNi if BP remains stable  - continue digoxin 0.125 mg daily - continue Toprol XL 100 mg daily  - imperative to get back in NSR. See below  - will need eventual R/LHC     2. Persistent Atrial Fibrillation  - complicating HF and MR - needs attempt at TEE/DCCV but needs further diuresis first (see above).  - start amio gtt   - continue Toprol XL 100 mg daily  - on Xarelto PTA, Covering w/ heparin gtt (anticipate cath next wk) - continue digoxin 0.125 mg daily. Check dig level    3. Mitral Regurgitation  - mod-severe on recent Echos - suspect functional MR c/b persistent afib  - HF and Afib optimization per above      4. CAD  - s/p LAD PCI + DES at [facility] 04/2019  - she denies any recent CP  - on ASA, statin + ? blocker   5. Chronic Hypoxic Respiratory Failure/COPD  - on Home O2 2L/min baseline. O2 sats 94%  - refer to [place] [facility]  - advised to quit smoking    6. Untreated OSA - reports prior diagnosis but not on CPAP  - needs to be treated. Plan outpatient referral to pulmonology   7. Hypokalemia - K 2.8 in ED - aggressive K supp  - increase Spiro to 25 - follow BMP closely    8. Debilitating Sciatic Nerve Pain  - pt sought ER evaluation  - management/pain control per IM    Length of Stay: 0  Shirely Toren, PA-C  08/06/2023, 3:10 PM  Advanced Heart  Failure Team Pager [phone] (M-F; 7a - 5p)  Please contact [facility] for night-coverage after hours (4p -7a ) and weekends on [email or web address]

## 2023-08-07 DIAGNOSIS — I5023 Acute on chronic systolic (congestive) heart failure: Secondary | ICD-10-CM | POA: Diagnosis not present

## 2023-08-07 DIAGNOSIS — I4891 Unspecified atrial fibrillation: Secondary | ICD-10-CM | POA: Diagnosis not present

## 2023-08-07 LAB — PHOSPHORUS: Phosphorus: 4.9 mg/dL — ABNORMAL HIGH (ref 2.5–4.6)

## 2023-08-07 LAB — APTT
aPTT: 55 s — ABNORMAL HIGH (ref 24–36)
aPTT: 88 s — ABNORMAL HIGH (ref 24–36)

## 2023-08-07 LAB — BASIC METABOLIC PANEL
Anion gap: 12 (ref 5–15)
BUN: 12 mg/dL (ref 8–23)
CO2: 27 mmol/L (ref 22–32)
Calcium: 9 mg/dL (ref 8.9–10.3)
Chloride: 94 mmol/L — ABNORMAL LOW (ref 98–111)
Creatinine, Ser: 0.95 mg/dL (ref 0.44–1.00)
GFR, Estimated: >60 mL/min (ref >60)
Glucose, Bld: 256 mg/dL — ABNORMAL HIGH (ref 70–99)
Potassium: 2.9 mmol/L — ABNORMAL LOW (ref 3.5–5.1)
Sodium: 133 mmol/L — ABNORMAL LOW (ref 135–145)

## 2023-08-07 LAB — VITAMIN D 25 HYDROXY (VIT D DEFICIENCY, FRACTURES): Vit D, 25-Hydroxy: 30.64 ng/mL (ref 30–100)

## 2023-08-07 LAB — HEPARIN LEVEL (UNFRACTIONATED)
Heparin Unfractionated: 0.99 [IU]/mL — ABNORMAL HIGH (ref 0.30–0.70)
Heparin Unfractionated: 0.99 [IU]/mL — ABNORMAL HIGH (ref 0.30–0.70)

## 2023-08-07 LAB — MAGNESIUM: Magnesium: 2 mg/dL (ref 1.7–2.4)

## 2023-08-07 LAB — TSH: TSH: 2.242 u[IU]/mL (ref 0.350–4.500)

## 2023-08-07 MED ORDER — ACETAMINOPHEN 325 MG PO TABS
650.0000 mg | ORAL_TABLET | Freq: Four times a day (QID) | ORAL | Status: DC
  Administered 2023-08-07 – 2023-08-09 (×7): 650 mg via ORAL
  Filled 2023-08-07 (×7): qty 2

## 2023-08-07 MED ORDER — ENSURE ENLIVE PO LIQD
237.0000 mL | Freq: Two times a day (BID) | ORAL | Status: DC
  Administered 2023-08-08 – 2023-08-12 (×8): 237 mL via ORAL

## 2023-08-07 MED ORDER — NICOTINE 21 MG/24HR TD PT24
21.0000 mg | MEDICATED_PATCH | Freq: Every day | TRANSDERMAL | Status: DC | PRN
  Administered 2023-08-07: 21 mg via TRANSDERMAL
  Filled 2023-08-07 (×2): qty 1

## 2023-08-07 MED ORDER — OXYCODONE-ACETAMINOPHEN 5-325 MG PO TABS
1.0000 | ORAL_TABLET | Freq: Four times a day (QID) | ORAL | Status: DC | PRN
  Administered 2023-08-07 – 2023-08-08 (×3): 1 via ORAL
  Administered 2023-08-08: 2 via ORAL
  Administered 2023-08-08: 1 via ORAL
  Administered 2023-08-09 – 2023-08-10 (×4): 2 via ORAL
  Administered 2023-08-11: 1 via ORAL
  Administered 2023-08-11: 2 via ORAL
  Administered 2023-08-12: 1 via ORAL
  Filled 2023-08-07 (×7): qty 2
  Filled 2023-08-07 (×4): qty 1
  Filled 2023-08-07: qty 2
  Filled 2023-08-07: qty 1

## 2023-08-07 MED ORDER — POTASSIUM CHLORIDE CRYS ER 20 MEQ PO TBCR
40.0000 meq | EXTENDED_RELEASE_TABLET | Freq: Two times a day (BID) | ORAL | Status: AC
  Administered 2023-08-07 (×2): 40 meq via ORAL
  Filled 2023-08-07 (×2): qty 2

## 2023-08-07 MED ORDER — GABAPENTIN 300 MG PO CAPS
600.0000 mg | ORAL_CAPSULE | Freq: Three times a day (TID) | ORAL | Status: DC
  Administered 2023-08-07 – 2023-08-08 (×4): 600 mg via ORAL
  Filled 2023-08-07 (×4): qty 2

## 2023-08-07 MED ORDER — POTASSIUM CHLORIDE CRYS ER 20 MEQ PO TBCR
40.0000 meq | EXTENDED_RELEASE_TABLET | Freq: Once | ORAL | Status: AC
  Administered 2023-08-07: 40 meq via ORAL
  Filled 2023-08-07: qty 2

## 2023-08-07 MED ORDER — LEVALBUTEROL HCL 0.63 MG/3ML IN NEBU: 0.6300 mg | INHALATION_SOLUTION | Freq: Four times a day (QID) | RESPIRATORY_TRACT | Status: DC | PRN

## 2023-08-07 MED ORDER — FUROSEMIDE 10 MG/ML IJ SOLN
80.0000 mg | Freq: Two times a day (BID) | INTRAMUSCULAR | Status: DC
Start: 2023-08-07 — End: 2023-08-09
  Administered 2023-08-07 – 2023-08-08 (×4): 80 mg via INTRAVENOUS
  Filled 2023-08-07 (×3): qty 8

## 2023-08-07 NOTE — ED Notes (Signed)
Patient was given meal tray didn't like what she got. Called kitchen and they will send her request.

## 2023-08-07 NOTE — Progress Notes (Signed)
ANTICOAGULATION CONSULT NOTE   Pharmacy Consult for Heparin Indication: atrial fibrillation  Allergies  Allergen Reactions   Pamelor [Nortriptyline Hcl] Anaphylaxis   Vibra-Tab [Doxycycline] Other (See Comments)    Unknown reaction    Patient Measurements: Height: 5\' 4"  (162.6 cm) Weight: 98.4 kg (216 lb 14.9 oz) IBW/kg (Calculated) : 54.7 Heparin Dosing Weight: 77.4 kg  Vital Signs: Temp: 98.7 F (37.1 C) (12/28 0812) Temp Source: Oral (12/28 0812) BP: 138/99 (12/28 0812) Pulse Rate: 115 (12/28 0812)  Labs: Recent Labs    08/06/23 1218 08/06/23 1802 08/06/23 2350 08/07/23 0850  HGB 13.2  --   --   --   HCT 40.4  --   --   --   PLT 287  --   --   --   APTT  --  33 55* 88*  HEPARINUNFRC  --  >1.10* 0.99* 0.99*  CREATININE 1.13*  --   --  0.95    Estimated Creatinine Clearance: 66.4 mL/min (by C-G formula based on SCr of 0.95 mg/dL).   Medical History: Past Medical History:  Diagnosis Date   Anxiety    Arthritis    Attention deficit disorder (ADD)    COPD (chronic obstructive pulmonary disease) (HCC)    Depression    Drug abuse (HCC)    Fibromyalgia    HFrEF (heart failure with reduced ejection fraction) (HCC)    Hyperlipidemia    Paroxysmal Atrial fibrillation     Medications:  (Not in a hospital admission)  Scheduled:   ARIPiprazole  5 mg Oral Daily   aspirin  81 mg Oral Daily   atorvastatin  80 mg Oral Daily   dapagliflozin propanediol  10 mg Oral Daily   digoxin  0.125 mg Oral Daily   fluticasone furoate-vilanterol  1 puff Inhalation Daily   And   umeclidinium bromide  1 puff Inhalation Daily   metoprolol succinate  100 mg Oral Daily   pantoprazole  40 mg Oral Daily   spironolactone  25 mg Oral Daily   Infusions:   amiodarone 30 mg/hr (08/07/23 0139)   furosemide     heparin 1,350 Units/hr (08/07/23 0137)   PRN: acetaminophen **OR** acetaminophen, fluticasone, gabapentin, ondansetron **OR** ondansetron (ZOFRAN) IV,  oxyCODONE-acetaminophen, senna-docusate  Assessment: 89 yof with a history of HF, CAD s/p prior DES to LAD, AF, mitral regurgitation, COPD. Patient is presenting with left-sided sciatic pain and AF. Heparin per pharmacy consult placed for atrial fibrillation.  Patient is on Xarelto prior to arrival. Last dose is unknown. Outpatient . Will require aPTT monitoring due to likely falsely high anti-Xa level secondary to DOAC use.  aPTT 88 - therapeutic  HL 0.99 - likely still affected by last DOAC use   Goal of Therapy:  Heparin level 0.3-0.7 units/ml aPTT 66-102 seconds Monitor platelets by anticoagulation protocol: Yes   Plan:  Continue heparin infusion at 1350 units/hr Check aPTT & anti-Xa level daily while on heparin Continue to monitor via aPTT until levels are correlated Continue to monitor H&H and platelets  Calton Dach, PharmD, BCCCP Clinical Pharmacist 08/07/2023 10:06 AM

## 2023-08-07 NOTE — ED Notes (Signed)
Lasix 80 mg wasn't given the 120 mg was given.

## 2023-08-07 NOTE — Plan of Care (Signed)
  Problem: Education: Goal: Knowledge of General Education information will improve Description: Including pain rating scale, medication(s)/side effects and non-pharmacologic comfort measures Outcome: Progressing   Problem: Health Behavior/Discharge Planning: Goal: Ability to manage health-related needs will improve Outcome: Progressing   Problem: Clinical Measurements: Goal: Ability to maintain clinical measurements within normal limits will improve Outcome: Progressing Goal: Will remain free from infection Outcome: Progressing Goal: Diagnostic test results will improve Outcome: Progressing Goal: Respiratory complications will improve Outcome: Progressing Goal: Cardiovascular complication will be avoided Outcome: Progressing   Problem: Elimination: Goal: Will not experience complications related to bowel motility Outcome: Progressing Goal: Will not experience complications related to urinary retention Outcome: Progressing   Problem: Safety: Goal: Ability to remain free from injury will improve Outcome: Progressing   Problem: Education: Goal: Knowledge of disease or condition will improve Outcome: Progressing Goal: Understanding of medication regimen will improve Outcome: Progressing Goal: Individualized Educational Video(s) Outcome: Progressing   Problem: Cardiac: Goal: Ability to achieve and maintain adequate cardiopulmonary perfusion will improve Outcome: Progressing

## 2023-08-07 NOTE — ED Notes (Signed)
Ambulatory to restroom with pt personal cane.

## 2023-08-07 NOTE — Progress Notes (Signed)
[facility] PROGRESS NOTE  Autumn [name] (DOB: 05-May-1956) XBM:[id number] PCP: Vivien Presto, MD Outpatient Specialists: Cardiology, Dr. Elwyn Lade  Brief Narrative: Autumn [name] is a 67 y.o. female with a history of HFrEF, AFib, CAD, 2L O2-dependent COPD, anxiety/depression/ADHD, fibromyalgia, HLD, OA who presented to the ED on 08/06/2023 from her cardiologist office for evaluation of sciatic lower back/left leg pain and AFib with RVR. She was found to be volume overloaded, so IV lasix, amiodarone and heparin infusions were started, and the patient admitted with cardiology consultation. XR demonstrates progressive degenerative disc disease involving L3-L4-L5 levels.   Subjective: Emotionally labile, doesn't want to be in the hospital. Denies SOB or chest pain or palpitations. Her lower back pain radiating to the left leg has been present for over a week. She denies numbness or weakness in the leg, still getting around on a cane. Pain improved when leaning forward.   Objective: BP (!) 138/99 (BP Location: Right Arm)   Pulse (!) 115   Temp 98.7 F (37.1 C) (Oral)   Resp (!) 22   Ht 5\' 4"  (1.626 m)   Wt 98.4 kg   SpO2 98%   BMI 37.24 kg/m   Gen: No distress, obese female appearing older than stated age Pulm: Nonlabored on 2L O2, no crackles or wheezes  CV: Irreg tachycardia, II/VI systolic murmur at base, trace LE edema GI: Soft, NT, ND, +BS Neuro: Alert and oriented. No new focal deficits. Ext: Warm, no deformities. EHL and ankle plantarflexion 5/5 bilaterally, patellar DTRs intact and symmetric. SILT throughout.  Skin: No rashes, lesions or ulcers on visualized skin   Assessment & Plan: A-fib with RVR: History of persistent A-fib with with previous cardioversion unsuccessful. Presented with heart rate in the 120s to 140s. She remains relatively asymptomatic. K+ down to 2.8. TSH wnl. - Cardiology plans to attempt TEE/DCCV once patient has been diuresed   - Cardiology  following, appreciate recs - Continue amiodarone gtt.and heparin gtt. - Continue metoprolol succinate, continue digoxin, level only 0.3.  - Supp K as below    Acute on chronic HFrEF: TTE in 05/2023 showed EF 30-35%, global HK, severe LAE, mod-severe MR. Patient slightly fluid overloaded on exam however CXR looks clear.  Likely CHF exacerbation in the setting of her persistent A-fib with elevated rates.  - Furosemide ordered but refused by patient yesterday. Would preferably optimize volume status prior to DCCV attempt. Hopefully, she'll take a dose today.  - Continue GDMT with metoprolol succinate, spironolactone, dapagliflozin. - Daily weights, strict I&O - Will likely need eventual R/L heart cath per cardiology  Advanced lumbar DDD, widespread OA, fibromyalgia: Known to have severe arthritis, has been seen by orthopedics in [facility] previously.  - Continue oral analgesics including scheduled gabapentin and tylenol as well as prn oxycodone. Pt not taking cymbalta  - PT/OT - Will need orthopedic/spine surgery follow up for this chronic progressive condition. Pt doesn't want surgery even when knee replacements were offered previously.  Has sensation, patellar DTR, and motor function preserved bilateral LE's.   Hypokalemia: Refractory to supplementation - Augment supplementation in light of ongoing loop diuretic use.  - Continue spironolactone 25 mg daily  - continue monitoring BMP, Mag (currently wnl at 2.0).    CAD: s/p DES to LAD in 2020. She denies any chest pain. EKG without any acute ischemic changes. No troponin checked.  - Continue ASA, BB, statin.  - Cardiology feels repeat ischemic evaluation would be helpful once more clinically stable/compensated.  Chronic hypoxic respiratory failure, COPD: Quiescent - Continue home 2L O2.  - Continue breztri and prn xopenex   OSA: Not on CPAP therapy. -Outpatient follow-up with pulmonology  Anxiety, depression: Not on home medications.   - Continue aripiprazole, consider prn  Morbid obesity: Body mass index is 37.24 kg/m.   Tobacco use:  - Nicotine patch prn  GERD:  - PPI  Tyrone Nine, MD [facility] [email or web address] 08/07/2023, 10:57 AM

## 2023-08-07 NOTE — ED Notes (Signed)
Patient again verbally aggressive with staff displaying multiple curse words and threats. Pt adamant that she wants to leave AMA. Encouraged to stay as she appears to be in serious condition with assisting RN In the room.   Patient attempts to call family but unsuccessful. Says she does not care what happens. Accuses staff of not administering medications. Pt reminded of multiple refused medications and discussions with previous RN and admitting physicians.   Therapeutic communication ad de-escalation techniques implemented. Minimal improvement in patient symptoms.

## 2023-08-07 NOTE — Progress Notes (Signed)
Patient ID: Autumn [name], female   DOB: 02-25-1956, 67 y.o.   MRN: [id number]     Advanced Heart Failure Rounding Note  Cardiologist: Thomasene Ripple, DO   Subjective:   Chief Complaint: Shortness of breath  Patient got Lasix 120 mg IV yesterday, I/Os not recorded.  She says that her breathing is "a little better."   HR in 100s on amiodarone gtt 30 mg/hr.  K low at 2.9, creatinine stable 0.95.    Objective:   Weight Range: 98.4 kg Body mass index is 37.24 kg/m.   Vital Signs:   Temp:  [98 F (36.7 C)-98.7 F (37.1 C)] 98.7 F (37.1 C) (12/28 1153) Pulse Rate:  [32-140] 121 (12/28 1138) Resp:  [13-29] 20 (12/28 1138) BP: (109-156)/(73-130) 156/130 (12/28 1138) SpO2:  [91 %-100 %] 97 % (12/28 1138) Weight:  [98.4 kg] 98.4 kg (12/27 1521)    Weight change: Filed Weights   08/06/23 1521  Weight: 98.4 kg    Intake/Output:   Intake/Output Summary (Last 24 hours) at 08/07/2023 1218 Last data filed at 08/07/2023 0654 Gross per 24 hour  Intake 120 ml  Output 500 ml  Net -380 ml      Physical Exam    General:  Well appearing. No resp difficulty HEENT: Normal Neck: Supple. JVP 14 cm. Carotids 2+ bilat; no bruits. No lymphadenopathy or thyromegaly appreciated. Cor: PMI nondisplaced. Irregular rate & rhythm. No rubs, gallops or murmurs. Lungs: Rhonchi bilaterally.  Abdomen: Soft, nontender, nondistended. No hepatosplenomegaly. No bruits or masses. Good bowel sounds. Extremities: No cyanosis, clubbing, rash, edema Neuro: Alert & orientedx3, cranial nerves grossly intact. moves all 4 extremities w/o difficulty. Affect pleasant   Telemetry   Atrial fibrillation rate 100s (personally reviewed)  Labs    CBC Recent Labs    08/06/23 1218  WBC 7.3  HGB 13.2  HCT 40.4  MCV 83.3  PLT 287   Basic Metabolic Panel Recent Labs    52/84/13 1218 08/07/23 0543 08/07/23 0850  NA 133*  --  133*  K 2.8*  --  2.9*  CL 94*  --  94*  CO2 27  --  27  GLUCOSE 102*  --   256*  BUN 14  --  12  CREATININE 1.13*  --  0.95  CALCIUM 9.1  --  9.0  MG  --  2.0  --   PHOS  --  4.9*  --    Liver Function Tests No results for input(s): "AST", "ALT", "ALKPHOS", "BILITOT", "PROT", "ALBUMIN" in the last 72 hours. No results for input(s): "LIPASE", "AMYLASE" in the last 72 hours. Cardiac Enzymes No results for input(s): "CKTOTAL", "CKMB", "CKMBINDEX", "TROPONINI" in the last 72 hours.  BNP: BNP (last 3 results) Recent Labs    05/31/23 0712 07/02/23 1153 08/06/23 1802  BNP 606.9* 411.5* 238.2*    ProBNP (last 3 results) No results for input(s): "PROBNP" in the last 8760 hours.   D-Dimer No results for input(s): "DDIMER" in the last 72 hours. Hemoglobin A1C No results for input(s): "HGBA1C" in the last 72 hours. Fasting Lipid Panel No results for input(s): "CHOL", "HDL", "LDLCALC", "TRIG", "CHOLHDL", "LDLDIRECT" in the last 72 hours. Thyroid Function Tests Recent Labs    08/07/23 0543  TSH 2.242    Other results:   Imaging    DG Hip Unilat W or Wo Pelvis 2-3 Views Left Result Date: 08/06/2023 CLINICAL DATA:  Pain. EXAM: DG HIP (WITH OR WITHOUT PELVIS) 3V LEFT COMPARISON:  10/28/2010. FINDINGS: There  is bilateral hip and sacroiliac degenerative change with joint space narrowing and small osteophytes. Pelvic ring is intact. No acute fracture, dislocation or subluxation. No osteolytic or osteoblastic lesions. IMPRESSION: Bilateral hip degenerative changes.  No acute osseous abnormalities. Electronically Signed   By: Layla Maw M.D.   On: 08/06/2023 16:23   DG Lumbar Spine 2-3 Views Result Date: 08/06/2023 CLINICAL DATA:  Pain. EXAM: LUMBAR SPINE 3  VIEW COMPARISON:  10/28/2010. FINDINGS: Significantly worse multilevel lumbar degenerative disc disease with disc space narrowing, sclerosis and marginal osteophytes most severe at the L3-L4-L5 levels. No significant compression deformities. Aortoiliac atheromatous calcifications IMPRESSION:  Worsening degenerative changes. Electronically Signed   By: Layla Maw M.D.   On: 08/06/2023 16:21   DG Chest Port 1 View Result Date: 08/06/2023 CLINICAL DATA:  Atrial fibrillation EXAM: PORTABLE CHEST 1 VIEW COMPARISON:  05/31/2023 FINDINGS: Single frontal view of the chest demonstrates mild enlargement the cardiac silhouette. No airspace disease, effusion, or pneumothorax. No acute bony abnormalities. IMPRESSION: 1. Mild enlargement of the cardiac silhouette. 2. No acute airspace disease. Electronically Signed   By: Sharlet Salina M.D.   On: 08/06/2023 15:15     Medications:     Scheduled Medications:  acetaminophen  650 mg Oral Q6H   ARIPiprazole  5 mg Oral Daily   aspirin  81 mg Oral Daily   atorvastatin  80 mg Oral Daily   dapagliflozin propanediol  10 mg Oral Daily   digoxin  0.125 mg Oral Daily   fluticasone furoate-vilanterol  1 puff Inhalation Daily   And   umeclidinium bromide  1 puff Inhalation Daily   furosemide  80 mg Intravenous BID   gabapentin  600 mg Oral TID   metoprolol succinate  100 mg Oral Daily   pantoprazole  40 mg Oral Daily   potassium chloride  40 mEq Oral BID   potassium chloride  40 mEq Oral Once   spironolactone  25 mg Oral Daily    Infusions:  amiodarone 30 mg/hr (08/07/23 1134)   furosemide     heparin 1,350 Units/hr (08/07/23 0137)    PRN Medications: fluticasone, levalbuterol, nicotine, ondansetron **OR** ondansetron (ZOFRAN) IV, oxyCODONE-acetaminophen, senna-docusate   Assessment/Plan   1. Acute on chronic systolic CHF: Echo in 6/22 with EF 60-65%.  Has had multiple echoes in 2024, each with lower EF.  Most recent echo in 10/24 with EF 30-35%, moderate-severe MR.  Cause of cardiomyopathy is uncertain.  She has a history of CAD but denies recent chest pain.  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  She was admitted with volume overload and dyspnea.  She remains volume  overloaded on exam.  - Lasix 80 mg IV bid, dose now.  Follow I/Os and weights.  - Replace K aggressively.  - Continue Farxiga 10 daily - Continue spironolactone 25 daily - Continue digoxin 0.125 daily - Continue Toprol XL 100 daily - Can add Entresto if BP remains stable.  - I will arrange for [facility] Monday to assess filling pressures and rule out progressive CAD as cause of cardiomyopathy.  - Will need eventual TEE-guided DCCV after she is fully diuresed.  2. Atrial fibrillation: Persistent, multiple admissions with AF/RVR.  Says she has never been cardioverted.  On Xarelto at home, heparin gtt currently.  - Continue amiodarone gtt 30 mg/hr.  - Continue Toprol XL and digoxin.  - Continue heparin gtt.  - She will need TEE-guided DCCV after diuresis.  - Would favor  eventual AF ablation.  3. 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  - Would reassess when in NSR.  4. CAD: DES to LAD in 9/20.  No recent chest pain.  - Given fall in EF, recommend LHC/RHC on Monday.  Discussed risks/benefits with patient and she agrees to procedure.  - Continue ASA 81 and atorvastatin.  5. COPD: She smokes and uses home oxygen at times.  - Needs to quit smoking.  6. Hypokalemia: Aggressive repletion today.  7. Sciatica: Significant pain.  Per [facility].  8. OSA: Suspected.  - Will need sleep study.   Marca Ancona 08/07/2023 12:27 PM   Length of Stay: 1  Marca Ancona, MD  08/07/2023, 12:18 PM  Advanced Heart Failure Team Pager [phone] (M-F; 7a - 5p)  Please contact [facility] for night-coverage after hours (5p -7a ) and weekends on [email or web address]

## 2023-08-07 NOTE — ED Notes (Signed)
ED TO INPATIENT HANDOFF REPORT  ED Nurse Name and Phone #: [phone]  S Name/Age/Gender Autumn [name] 67 y.o. female Room/Bed: 034C/034C  Code Status   Code Status: Full Code  Home/SNF/Other Home Patient oriented to: self, place, time, and situation Is this baseline? Yes   Triage Complete: Triage complete  Chief Complaint Atrial fibrillation with RVR (HCC) [I48.91]  Triage Note Pt c/o L lower back pain radiating down her leg for approximately one week. Pt has hx of sciatica and states that pain is similar. Denies saddle anesthesia, no bowel/bladder incontinence. Pt also states that she is in a-fib, which is her baseline. Denies palpitations, CP, SOB.    Allergies Allergies  Allergen Reactions   Pamelor [Nortriptyline Hcl] Anaphylaxis   Vibra-Tab [Doxycycline] Other (See Comments)    Unknown reaction    Level of Care/Admitting Diagnosis ED Disposition     ED Disposition  Admit   Condition  --   Comment  Hospital Area: [facility] [[id number]]  Level of Care: Telemetry Medical [104]  May admit patient to [facility] or [facility] if equivalent level of care is available:: No  Covid Evaluation: Asymptomatic - no recent exposure (last 10 days) testing not required  Diagnosis: Atrial fibrillation with RVR [facility]) [[id number]]  Admitting Physician: Steffanie Rainwater [[id number]]  Attending Physician: Steffanie Rainwater [[id number]]  Certification:: I certify this patient will need inpatient services for at least 2 midnights  Expected Medical Readiness: 08/08/2023          B Medical/Surgery History Past Medical History:  Diagnosis Date   Anxiety    Arthritis    Attention deficit disorder (ADD)    COPD (chronic obstructive pulmonary disease) (HCC)    Depression    Drug abuse (HCC)    Fibromyalgia    HFrEF (heart failure with reduced ejection fraction) (HCC)    Hyperlipidemia    Paroxysmal Atrial fibrillation    Past Surgical History:  Procedure  Laterality Date   ANKLE SURGERY     FACIAL COSMETIC SURGERY       A IV Location/Drains/Wounds Patient Lines/Drains/Airways Status     Active Line/Drains/Airways     Name Placement date Placement time Site Days   Peripheral IV 08/06/23 22 G Right;Posterior Forearm 08/06/23  1254  Forearm  1   Peripheral IV 08/06/23 22 G 1" Anterior;Left Forearm 08/06/23  2042  Forearm  1            Intake/Output Last 24 hours  Intake/Output Summary (Last 24 hours) at 08/07/2023 1413 Last data filed at 08/07/2023 0654 Gross per 24 hour  Intake 120 ml  Output 500 ml  Net -380 ml    Labs/Imaging Results for orders placed or performed during the hospital encounter of 08/06/23 (from the past 48 hours)  Basic metabolic panel     Status: Abnormal   Collection Time: 08/06/23 12:18 PM  Result Value Ref Range   Sodium 133 (L) 135 - 145 mmol/L   Potassium 2.8 (L) 3.5 - 5.1 mmol/L   Chloride 94 (L) 98 - 111 mmol/L   CO2 27 22 - 32 mmol/L   Glucose, Bld 102 (H) 70 - 99 mg/dL    Comment: Glucose reference range applies only to samples taken after fasting for at least 8 hours.   BUN 14 8 - 23 mg/dL   Creatinine, Ser 1.02 (H) 0.44 - 1.00 mg/dL   Calcium 9.1 8.9 - 72.5 mg/dL   GFR, Estimated 54 (L) >60 mL/min  Comment: (NOTE) Calculated using the CKD-EPI Creatinine Equation (2021)    Anion gap 12 5 - 15    Comment: Performed at [facility], [address], [place], [place] [address]  CBC     Status: None   Collection Time: 08/06/23 12:18 PM  Result Value Ref Range   WBC 7.3 4.0 - 10.5 K/uL   RBC 4.85 3.87 - 5.11 MIL/uL   Hemoglobin 13.2 12.0 - 15.0 g/dL   HCT 56.4 33.2 - 95.1 %   MCV 83.3 80.0 - 100.0 fL   MCH 27.2 26.0 - 34.0 pg   MCHC 32.7 30.0 - 36.0 g/dL   RDW 88.4 16.6 - 06.3 %   Platelets 287 150 - 400 K/uL   nRBC 0.0 0.0 - 0.2 %    Comment: Performed at [facility], [address], [place], [place] [address]  Brain natriuretic peptide     Status: Abnormal    Collection Time: 08/06/23  6:02 PM  Result Value Ref Range   B Natriuretic Peptide 238.2 (H) 0.0 - 100.0 pg/mL    Comment: Performed at [facility], [address], [place], [place] [address]  Digoxin level     Status: Abnormal   Collection Time: 08/06/23  6:02 PM  Result Value Ref Range   Digoxin Level 0.3 (L) 0.8 - 2.0 ng/mL    Comment: Performed at [facility], [address], [place], [place] [address]  Heparin level (unfractionated)     Status: Abnormal   Collection Time: 08/06/23  6:02 PM  Result Value Ref Range   Heparin Unfractionated >1.10 (H) 0.30 - 0.70 IU/mL    Comment: (NOTE) The clinical reportable range upper limit is being lowered to >1.10 to align with the FDA approved guidance for the current laboratory assay.  If heparin results are below expected values, and patient dosage has  been confirmed, suggest follow up testing of antithrombin III levels. Performed at [facility], [address] [place], [place] [address]   APTT     Status: None   Collection Time: 08/06/23  6:02 PM  Result Value Ref Range   aPTT 33 24 - 36 seconds    Comment: Performed at [facility], [address], [place], [place] [address]  Heparin level (unfractionated)     Status: Abnormal   Collection Time: 08/06/23 11:50 PM  Result Value Ref Range   Heparin Unfractionated 0.99 (H) 0.30 - 0.70 IU/mL    Comment: (NOTE) The clinical reportable range upper limit is being lowered to >1.10 to align with the FDA approved guidance for the current laboratory assay.  If heparin results are below expected values, and patient dosage has  been confirmed, suggest follow up testing of antithrombin III levels. Performed at [facility], [address], [place], [place] [address]   APTT     Status: Abnormal   Collection Time: 08/06/23 11:50 PM  Result Value Ref Range   aPTT 55 (H) 24 - 36 seconds    Comment:        IF BASELINE aPTT IS ELEVATED, SUGGEST PATIENT RISK  ASSESSMENT BE USED TO DETERMINE APPROPRIATE ANTICOAGULANT THERAPY. Performed at [facility], [address] [place], [place] [address]   Magnesium     Status: None   Collection Time: 08/07/23  5:43 AM  Result Value Ref Range   Magnesium 2.0 1.7 - 2.4 mg/dL    Comment: Performed at  [facility], [address] [place] [place], [place] [address]  Phosphorus     Status: Abnormal   Collection Time: 08/07/23  5:43 AM  Result Value Ref Range   Phosphorus 4.9 (H) 2.5 - 4.6 mg/dL    Comment: Performed at [facility], [address], [place], [place] [address]  VITAMIN D 25 Hydroxy (Vit-D Deficiency, Fractures)     Status: None   Collection Time: 08/07/23  5:43 AM  Result Value Ref Range   Vit D, 25-Hydroxy 30.64 30 - 100 ng/mL    Comment: (NOTE) Vitamin D deficiency has been defined by the Institute of Medicine  and an Endocrine Society practice guideline as a level of serum 25-OH  vitamin D less than 20 ng/mL (1,2). The Endocrine Society went on to  further define vitamin D insufficiency as a level between 21 and 29  ng/mL (2).  1. IOM (Institute of Medicine). 2010. Dietary reference intakes for  calcium and D. [place]: [employer]. 2. Holick MF, Binkley Parcoal, Bischoff-Ferrari HA, et al. Evaluation,  treatment, and prevention of vitamin D deficiency: an Endocrine  Society clinical practice guideline, JCEM. 2011 Jul; 96(7): 1911-30.  Performed at [facility] Lab, [address] [place], [place] [address]   TSH     Status: None   Collection Time: 08/07/23  5:43 AM  Result Value Ref Range   TSH 2.242 0.350 - 4.500 uIU/mL    Comment: Performed by a 3rd Generation assay with a functional sensitivity of <=0.01 uIU/mL. Performed at [facility], [address] [place], [place] [address]   Heparin level (unfractionated)     Status: Abnormal   Collection Time: 08/07/23  8:50 AM  Result Value Ref Range   Heparin Unfractionated 0.99 (H) 0.30 -  0.70 IU/mL    Comment: (NOTE) The clinical reportable range upper limit is being lowered to >1.10 to align with the FDA approved guidance for the current laboratory assay.  If heparin results are below expected values, and patient dosage has  been confirmed, suggest follow up testing of antithrombin III levels. Performed at [facility], [address], [place], [place] [address]   APTT     Status: Abnormal   Collection Time: 08/07/23  8:50 AM  Result Value Ref Range   aPTT 88 (H) 24 - 36 seconds    Comment:        IF BASELINE aPTT IS ELEVATED, SUGGEST PATIENT RISK ASSESSMENT BE USED TO DETERMINE APPROPRIATE ANTICOAGULANT THERAPY. Performed at [facility] Lab, [address] [place] [address], [place], [place] [address]   Basic metabolic panel     Status: Abnormal   Collection Time: 08/07/23  8:50 AM  Result Value Ref Range   Sodium 133 (L) 135 - 145 mmol/L   Potassium 2.9 (L) 3.5 - 5.1 mmol/L   Chloride 94 (L) 98 - 111 mmol/L   CO2 27 22 - 32 mmol/L   Glucose, Bld 256 (H) 70 - 99 mg/dL    Comment: Glucose reference range applies only to samples taken after fasting for at least 8 hours.   BUN 12 8 - 23 mg/dL   Creatinine, Ser 2.44 0.44 - 1.00 mg/dL   Calcium 9.0 8.9 - 01.0 mg/dL   GFR, Estimated >27 >25 mL/min    Comment: (NOTE) Calculated using the CKD-EPI Creatinine Equation (2021)    Anion gap 12 5 - 15    Comment: Performed at [facility],  [address], [place], [place] [address]   DG Hip Lucienne Capers or [place] Pelvis 2-3 Views Left Result Date: 08/06/2023 CLINICAL DATA:  Pain. EXAM: DG HIP (WITH OR WITHOUT PELVIS) 3V LEFT COMPARISON:  10/28/2010. FINDINGS: There is bilateral hip and sacroiliac degenerative change with joint space narrowing and small osteophytes. Pelvic ring is intact. No acute fracture, dislocation or subluxation. No osteolytic or osteoblastic lesions. IMPRESSION: Bilateral hip degenerative changes.  No acute osseous abnormalities. Electronically Signed   By:  Layla Maw M.D.   On: 08/06/2023 16:23   DG Lumbar Spine 2-3 Views Result Date: 08/06/2023 CLINICAL DATA:  Pain. EXAM: LUMBAR SPINE 3  VIEW COMPARISON:  10/28/2010. FINDINGS: Significantly worse multilevel lumbar degenerative disc disease with disc space narrowing, sclerosis and marginal osteophytes most severe at the L3-L4-L5 levels. No significant compression deformities. Aortoiliac atheromatous calcifications IMPRESSION: Worsening degenerative changes. Electronically Signed   By: Layla Maw M.D.   On: 08/06/2023 16:21   DG Chest Port 1 View Result Date: 08/06/2023 CLINICAL DATA:  Atrial fibrillation EXAM: PORTABLE CHEST 1 VIEW COMPARISON:  05/31/2023 FINDINGS: Single frontal view of the chest demonstrates mild enlargement the cardiac silhouette. No airspace disease, effusion, or pneumothorax. No acute bony abnormalities. IMPRESSION: 1. Mild enlargement of the cardiac silhouette. 2. No acute airspace disease. Electronically Signed   By: Sharlet Salina M.D.   On: 08/06/2023 15:15    Pending Labs Unresulted Labs (From admission, onward)    None       Vitals/Pain Today's Vitals   08/07/23 1113 08/07/23 1138 08/07/23 1153 08/07/23 1200  BP: (!) 156/130 (!) 156/130  102/66  Pulse: (!) 110 (!) 121  94  Resp: (!) 24 20    Temp:   98.7 F (37.1 C)   TempSrc:      SpO2: 98% 97%  99%  Weight:      Height:      PainSc:        Isolation Precautions No active isolations  Medications Medications  potassium chloride 10 mEq in 100 mL IVPB (10 mEq Intravenous Patient Refused/Not Given 08/06/23 2030)  furosemide (LASIX) 120 mg in dextrose 5 % 50 mL IVPB (120 mg Intravenous New Bag/Given 08/07/23 1401)  amiodarone (NEXTERONE) 1.8 mg/mL load via infusion 150 mg (150 mg Intravenous Bolus from Bag 08/06/23 2047)    Followed by  amiodarone (NEXTERONE PREMIX) 360-4.14 MG/200ML-% (1.8 mg/mL) IV infusion (60 mg/hr Intravenous New Bag/Given 08/06/23 2046)    Followed by  amiodarone  (NEXTERONE PREMIX) 360-4.14 MG/200ML-% (1.8 mg/mL) IV infusion (30 mg/hr Intravenous New Bag/Given 08/07/23 1134)  spironolactone (ALDACTONE) tablet 25 mg (25 mg Oral Given 08/07/23 1035)  metoprolol succinate (TOPROL-XL) 24 hr tablet 100 mg (100 mg Oral Given 08/07/23 1035)  heparin ADULT infusion 100 units/mL (25000 units/24mL) (1,350 Units/hr Intravenous New Bag/Given 08/07/23 1358)  senna-docusate (Senokot-S) tablet 1 tablet (has no administration in time range)  ondansetron (ZOFRAN) tablet 4 mg (has no administration in time range)    Or  ondansetron (ZOFRAN) injection 4 mg (has no administration in time range)  ARIPiprazole (ABILIFY) tablet 5 mg (5 mg Oral Given 08/07/23 1035)  aspirin chewable tablet 81 mg (81 mg Oral Given 08/07/23 1035)  atorvastatin (LIPITOR) tablet 80 mg (80 mg Oral Given 08/07/23 1036)  dapagliflozin propanediol (FARXIGA) tablet 10 mg (10 mg Oral Given 08/07/23 1035)  digoxin (LANOXIN) tablet 0.125 mg (0.125 mg Oral Given 08/07/23 1035)  fluticasone (FLONASE) 50 MCG/ACT nasal spray 2 spray (has no administration in time range)  pantoprazole (PROTONIX) EC tablet 40 mg (40 mg Oral Given 08/07/23 1036)  fluticasone furoate-vilanterol (BREO ELLIPTA) 100-25 MCG/ACT 1 puff (1 puff Inhalation Given 08/07/23 0823)    And  umeclidinium bromide (INCRUSE ELLIPTA) 62.5 MCG/ACT 1 puff (1 puff Inhalation Given 08/07/23 0824)  nicotine (NICODERM CQ - dosed in mg/24 hours) patch 21 mg (21 mg Transdermal Patch Applied 08/07/23 1135)  gabapentin (NEURONTIN) capsule 600 mg (600 mg Oral Given 08/07/23 1135)  potassium chloride SA (KLOR-CON M) CR tablet 40 mEq (40 mEq Oral Given 08/07/23 1135)  acetaminophen (TYLENOL) tablet 650 mg (650 mg Oral Given 08/07/23 1135)  oxyCODONE-acetaminophen (PERCOCET/ROXICET) 5-325 MG per tablet 1-2 tablet (1 tablet Oral Given 08/07/23 1352)  levalbuterol (XOPENEX) nebulizer solution 0.63 mg (has no administration in time range)  furosemide (LASIX)  injection 80 mg (80 mg Intravenous Given 08/07/23 1408)  potassium chloride SA (KLOR-CON M) CR tablet 40 mEq (40 mEq Oral Given 08/06/23 1450)  oxyCODONE-acetaminophen (PERCOCET/ROXICET) 5-325 MG per tablet 1 tablet (1 tablet Oral Given 08/06/23 1551)  potassium chloride SA (KLOR-CON M) CR tablet 40 mEq (40 mEq Oral Given 08/07/23 1351)    Mobility walks with device     Focused Assessments    R Recommendations: See Admitting Provider Note  Report given to:   Additional Notes: Patient is using a purewick at the present because of the IV lasix. Has been walking to the bathroom earlier with her cane , steady gait. Marland Kitchenshe has had periods of crying and whining then will be fine.

## 2023-08-07 NOTE — Progress Notes (Signed)
ANTICOAGULATION CONSULT NOTE   Pharmacy Consult for Heparin Indication: atrial fibrillation  Allergies  Allergen Reactions   Pamelor [Nortriptyline Hcl] Anaphylaxis   Vibra-Tab [Doxycycline] Other (See Comments)    Unknown reaction    Patient Measurements: Height: 5\' 4"  (162.6 cm) Weight: 98.4 kg (216 lb 14.9 oz) IBW/kg (Calculated) : 54.7 Heparin Dosing Weight: 77.4 kg  Vital Signs: Temp: 98.2 F (36.8 C) (12/28 0000) Temp Source: Oral (12/27 1623) BP: 109/74 (12/27 2200) Pulse Rate: 107 (12/27 2200)  Labs: Recent Labs    08/06/23 1218 08/06/23 1802 08/06/23 2350  HGB 13.2  --   --   HCT 40.4  --   --   PLT 287  --   --   APTT  --  33 55*  HEPARINUNFRC  --  >1.10* 0.99*  CREATININE 1.13*  --   --     Estimated Creatinine Clearance: 55.8 mL/min (A) (by C-G formula based on SCr of 1.13 mg/dL (H)).   Medical History: Past Medical History:  Diagnosis Date   Anxiety    Arthritis    Attention deficit disorder (ADD)    COPD (chronic obstructive pulmonary disease) (HCC)    Depression    Drug abuse (HCC)    Fibromyalgia    HFrEF (heart failure with reduced ejection fraction) (HCC)    Hyperlipidemia    Paroxysmal Atrial fibrillation     Medications:  (Not in a hospital admission)  Scheduled:   ARIPiprazole  5 mg Oral Daily   aspirin  81 mg Oral Daily   atorvastatin  80 mg Oral Daily   dapagliflozin propanediol  10 mg Oral Daily   digoxin  0.125 mg Oral Daily   fluticasone furoate-vilanterol  1 puff Inhalation Daily   And   umeclidinium bromide  1 puff Inhalation Daily   metoprolol succinate  100 mg Oral Daily   pantoprazole  40 mg Oral Daily   spironolactone  25 mg Oral Daily   Infusions:   amiodarone     furosemide     heparin 1,250 Units/hr (08/06/23 2029)   PRN: acetaminophen **OR** acetaminophen, fluticasone, gabapentin, ondansetron **OR** ondansetron (ZOFRAN) IV, oxyCODONE-acetaminophen, senna-docusate  Assessment: 43 yof with a history of  HF, CAD s/p prior DES to LAD, AF, mitral regurgitation, COPD. Patient is presenting with left-sided sciatic pain and AF. Heparin per pharmacy consult placed for atrial fibrillation.  Patient is on Xarelto prior to arrival. Last dose is unknown. Outpatient . Will require aPTT monitoring due to likely falsely high anti-Xa level secondary to DOAC use.  Hgb 13.2; plt 287  12/28 AM update: aPTT sub-therapeutic   Goal of Therapy:  Heparin level 0.3-0.7 units/ml aPTT 66-102 seconds Monitor platelets by anticoagulation protocol: Yes   Plan:  Inc heparin infusion to 1350 units/hr Check aPTT & anti-Xa level in 8 hours and daily while on heparin Continue to monitor via aPTT until levels are correlated Continue to monitor H&H and platelets  Abran Duke, PharmD, BCPS Clinical Pharmacist Phone: [phone]

## 2023-08-08 DIAGNOSIS — I4891 Unspecified atrial fibrillation: Secondary | ICD-10-CM | POA: Diagnosis not present

## 2023-08-08 DIAGNOSIS — I5023 Acute on chronic systolic (congestive) heart failure: Secondary | ICD-10-CM | POA: Diagnosis not present

## 2023-08-08 LAB — APTT
aPTT: 122 s — ABNORMAL HIGH (ref 24–36)
aPTT: 69 s — ABNORMAL HIGH (ref 24–36)
aPTT: 67 s — ABNORMAL HIGH (ref 24–36)

## 2023-08-08 LAB — CBC
HCT: 40.3 % (ref 36.0–46.0)
Hemoglobin: 12.9 g/dL (ref 12.0–15.0)
MCH: 27.6 pg (ref 26.0–34.0)
MCHC: 32 g/dL (ref 30.0–36.0)
MCV: 86.3 fL (ref 80.0–100.0)
Platelets: 294 10*3/uL (ref 150–400)
RBC: 4.67 MIL/uL (ref 3.87–5.11)
RDW: 15.4 % (ref 11.5–15.5)
WBC: 6.3 10*3/uL (ref 4.0–10.5)
nRBC: 0 % (ref 0.0–0.2)

## 2023-08-08 LAB — BASIC METABOLIC PANEL
Anion gap: 11 (ref 5–15)
BUN: 17 mg/dL (ref 8–23)
CO2: 26 mmol/L (ref 22–32)
Calcium: 9 mg/dL (ref 8.9–10.3)
Chloride: 98 mmol/L (ref 98–111)
Creatinine, Ser: 1.19 mg/dL — ABNORMAL HIGH (ref 0.44–1.00)
GFR, Estimated: 50 mL/min — ABNORMAL LOW (ref >60)
Glucose, Bld: 138 mg/dL — ABNORMAL HIGH (ref 70–99)
Potassium: 4.2 mmol/L (ref 3.5–5.1)
Sodium: 135 mmol/L (ref 135–145)

## 2023-08-08 LAB — HEPARIN LEVEL (UNFRACTIONATED): Heparin Unfractionated: 0.86 [IU]/mL — ABNORMAL HIGH (ref 0.30–0.70)

## 2023-08-08 MED ORDER — GABAPENTIN 300 MG PO CAPS
600.0000 mg | ORAL_CAPSULE | Freq: Three times a day (TID) | ORAL | Status: DC | PRN
  Administered 2023-08-09 – 2023-08-11 (×4): 600 mg via ORAL
  Filled 2023-08-08 (×5): qty 2

## 2023-08-08 MED ORDER — ASPIRIN 81 MG PO CHEW
81.0000 mg | CHEWABLE_TABLET | ORAL | Status: AC
  Administered 2023-08-09: 81 mg via ORAL
  Filled 2023-08-08: qty 1

## 2023-08-08 MED ORDER — SODIUM CHLORIDE 0.9 % IV SOLN: INTRAVENOUS | Status: DC

## 2023-08-08 MED ORDER — POTASSIUM CHLORIDE CRYS ER 20 MEQ PO TBCR
40.0000 meq | EXTENDED_RELEASE_TABLET | ORAL | Status: DC
  Administered 2023-08-08 (×2): 40 meq via ORAL
  Filled 2023-08-08 (×2): qty 2

## 2023-08-08 MED ORDER — CLONAZEPAM 0.5 MG PO TABS
1.0000 mg | ORAL_TABLET | Freq: Once | ORAL | Status: AC
  Administered 2023-08-08: 1 mg via ORAL
  Filled 2023-08-08: qty 2

## 2023-08-08 NOTE — Progress Notes (Signed)
Patient ID: Autumn [name], female   DOB: [birth year], 67 y.o.   MRN: [id number]     Advanced Heart Failure Rounding Note  Cardiologist: Thomasene Ripple, DO   Subjective:   Chief Complaint: Shortness of breath  I/Os net negative 1896 with weight down.  Still with dyspnea.  She remains in atrial fibrillation, rate-controlled on amiodarone gtt.    Objective:   Weight Range: 97.5 kg Body mass index is 36.89 kg/m.   Vital Signs:   Temp:  [98 F (36.7 C)-98.7 F (37.1 C)] 98 F (36.7 C) (12/29 0841) Pulse Rate:  [73-121] 97 (12/29 0841) Resp:  [12-20] 12 (12/29 0841) BP: (93-156)/(63-130) 127/84 (12/29 0841) SpO2:  [95 %-99 %] 95 % (12/29 0841) Weight:  [97.5 kg-98.9 kg] 97.5 kg (12/29 0349) Last BM Date : 08/07/23  Weight change: Filed Weights   08/06/23 1521 08/07/23 1528 08/08/23 0349  Weight: 98.4 kg 98.9 kg 97.5 kg    Intake/Output:   Intake/Output Summary (Last 24 hours) at 08/08/2023 1132 Last data filed at 08/08/2023 1042 Gross per 24 hour  Intake 1413.82 ml  Output 3800 ml  Net -2386.18 ml      Physical Exam    General: NAD Neck: JVP 12 cm, no thyromegaly or thyroid nodule.  Lungs: Clear to auscultation bilaterally with normal respiratory effort. CV: Nondisplaced PMI.  Heart irregular S1/S2, no S3/S4, no murmur.  Trace ankle edema.    Abdomen: Soft, nontender, no hepatosplenomegaly, no distention.  Skin: Intact without lesions or rashes.  Neurologic: Alert and oriented x 3.  Psych: Normal affect. Extremities: No clubbing or cyanosis.  HEENT: Normal.   Telemetry   Atrial fibrillation rate 80s (personally reviewed)  Labs    CBC Recent Labs    08/06/23 1218 08/08/23 0735  WBC 7.3 6.3  HGB 13.2 12.9  HCT 40.4 40.3  MCV 83.3 86.3  PLT 287 294   Basic Metabolic Panel Recent Labs    40/98/11 0543 08/07/23 0850 08/08/23 0948  NA  --  133* 135  K  --  2.9* 4.2  CL  --  94* 98  CO2  --  27 26  GLUCOSE  --  256* 138*  BUN  --  12 17   CREATININE  --  0.95 1.19*  CALCIUM  --  9.0 9.0  MG 2.0  --   --   PHOS 4.9*  --   --    Liver Function Tests No results for input(s): "AST", "ALT", "ALKPHOS", "BILITOT", "PROT", "ALBUMIN" in the last 72 hours. No results for input(s): "LIPASE", "AMYLASE" in the last 72 hours. Cardiac Enzymes No results for input(s): "CKTOTAL", "CKMB", "CKMBINDEX", "TROPONINI" in the last 72 hours.  BNP: BNP (last 3 results) Recent Labs    05/31/23 0712 07/02/23 1153 08/06/23 1802  BNP 606.9* 411.5* 238.2*    ProBNP (last 3 results) No results for input(s): "PROBNP" in the last 8760 hours.   D-Dimer No results for input(s): "DDIMER" in the last 72 hours. Hemoglobin A1C No results for input(s): "HGBA1C" in the last 72 hours. Fasting Lipid Panel No results for input(s): "CHOL", "HDL", "LDLCALC", "TRIG", "CHOLHDL", "LDLDIRECT" in the last 72 hours. Thyroid Function Tests Recent Labs    08/07/23 0543  TSH 2.242    Other results:   Imaging    No results found.    Medications:     Scheduled Medications:  acetaminophen  650 mg Oral Q6H   ARIPiprazole  5 mg Oral Daily   aspirin  81 mg Oral Daily   atorvastatin  80 mg Oral Daily   dapagliflozin propanediol  10 mg Oral Daily   digoxin  0.125 mg Oral Daily   feeding supplement  237 mL Oral BID BM   fluticasone furoate-vilanterol  1 puff Inhalation Daily   And   umeclidinium bromide  1 puff Inhalation Daily   furosemide  80 mg Intravenous BID   gabapentin  600 mg Oral TID   metoprolol succinate  100 mg Oral Daily   pantoprazole  40 mg Oral Daily   spironolactone  25 mg Oral Daily    Infusions:  amiodarone 30 mg/hr (08/07/23 2339)   heparin 1,200 Units/hr (08/08/23 0947)    PRN Medications: fluticasone, levalbuterol, nicotine, ondansetron **OR** ondansetron (ZOFRAN) IV, oxyCODONE-acetaminophen, senna-docusate   Assessment/Plan   1. Acute on chronic systolic CHF: Echo in 6/22 with EF 60-65%.  Has had multiple  echoes in 2024, each with lower EF.  Most recent echo in 10/24 with EF 30-35%, moderate-severe MR.  Cause of cardiomyopathy is uncertain.  She has a history of CAD but denies recent chest pain.  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  She was admitted with volume overload and dyspnea.  She remains volume overloaded on exam but diuresing well and weight is down.  - Continue Lasix 80 mg IV bid today.  - Replace K aggressively.  - Continue Farxiga 10 daily - Continue spironolactone 25 daily - Continue digoxin 0.125 daily - Continue Toprol XL 100 daily - Can add Entresto if BP remains stable, will wait until after cath.  - I will arrange for [facility] Monday to assess filling pressures and rule out progressive CAD as cause of cardiomyopathy.  - Will need TEE-guided DCCV after she is fully diuresed.  2. Atrial fibrillation: Persistent, multiple admissions with AF/RVR.  Says she has never been cardioverted.  On Xarelto at home, heparin gtt currently.  - Continue amiodarone gtt 30 mg/hr.  - Continue Toprol XL and digoxin.  - Continue heparin gtt.  - She will need TEE-guided DCCV after diuresis.  - Would favor eventual AF ablation.  3. 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  - Would reassess when in NSR.  4. CAD: DES to LAD in 9/20.  No recent chest pain.  - Given fall in EF, recommend LHC/RHC on Monday.  Discussed risks/benefits with patient and she agrees to procedure.  - Continue ASA 81 and atorvastatin.  5. COPD: She smokes and uses home oxygen at times.  - Needs to quit smoking.  6. Hypokalemia: Aggressive repletion today.  7. Sciatica: Significant pain.  Per [facility].  8. OSA: Suspected.  - Will need sleep study.   Marca Ancona 08/08/2023 11:32 AM   Length of Stay: 2  Marca Ancona, MD  08/08/2023, 11:32 AM  Advanced Heart Failure Team Pager [phone] (M-F; 7a - 5p)  Please contact  [facility] for night-coverage after hours (5p -7a ) and weekends on [email or web address]

## 2023-08-08 NOTE — Progress Notes (Signed)
ANTICOAGULATION CONSULT NOTE   Pharmacy Consult for Heparin Indication: atrial fibrillation  Allergies  Allergen Reactions   Pamelor [Nortriptyline Hcl] Anaphylaxis   Vibra-Tab [Doxycycline] Other (See Comments)    Unknown reaction    Patient Measurements: Height: 5\' 4"  (162.6 cm) Weight: 97.5 kg (214 lb 14.4 oz) IBW/kg (Calculated) : 54.7 Heparin Dosing Weight: 77.4 kg  Vital Signs: Temp: 98.2 F (36.8 C) (12/29 2003) Temp Source: Oral (12/29 2003) BP: 103/63 (12/29 2003) Pulse Rate: 76 (12/29 2003)  Labs: Recent Labs    08/06/23 1218 08/06/23 1802 08/06/23 2350 08/07/23 0850 08/08/23 0735 08/08/23 0948 08/08/23 1548 08/08/23 2202  HGB 13.2  --   --   --  12.9  --   --   --   HCT 40.4  --   --   --  40.3  --   --   --   PLT 287  --   --   --  294  --   --   --   APTT  --    < > 55* 88* 122*  --  69* 67*  HEPARINUNFRC  --    < > 0.99* 0.99* 0.86*  --   --   --   CREATININE 1.13*  --   --  0.95  --  1.19*  --   --    < > = values in this interval not displayed.    Estimated Creatinine Clearance: 52.7 mL/min (A) (by C-G formula based on SCr of 1.19 mg/dL (H)).   Medical History: Past Medical History:  Diagnosis Date   Anxiety    Arthritis    Attention deficit disorder (ADD)    COPD (chronic obstructive pulmonary disease) (HCC)    Depression    Drug abuse (HCC)    Fibromyalgia    HFrEF (heart failure with reduced ejection fraction) (HCC)    Hyperlipidemia    Paroxysmal Atrial fibrillation     Medications:  Medications Prior to Admission  Medication Sig Dispense Refill Last Dose/Taking   albuterol (VENTOLIN HFA) 108 (90 Base) MCG/ACT inhaler Inhale 2 puffs into the lungs every 4 (four) hours as needed for wheezing.   Past Week   ARIPiprazole (ABILIFY) 5 MG tablet Take 1 tablet (5 mg total) by mouth daily. 30 tablet 0 Past Week   aspirin (ASPIRIN CHILDRENS) 81 MG chewable tablet Chew 1 tablet (81 mg total) by mouth daily. 30 tablet 11 Past Week    atorvastatin (LIPITOR) 80 MG tablet Take 1 tablet (80 mg total) by mouth daily. 30 tablet 0 Past Week   Budeson-Glycopyrrol-Formoterol (BREZTRI AEROSPHERE) 160-9-4.8 MCG/ACT AERO Inhale 2 puffs into the lungs in the morning and at bedtime. 10.7 g 0 Past Week   dapagliflozin propanediol (FARXIGA) 10 MG TABS tablet Take 1 tablet (10 mg total) by mouth daily. 30 tablet 0 Past Week   digoxin (LANOXIN) 0.125 MG tablet Take 1 tablet (0.125 mg total) by mouth daily. 30 tablet 3 Past Week   fluticasone (FLONASE) 50 MCG/ACT nasal spray Place 2 sprays into both nostrils daily as needed for allergies.   Past Week   gabapentin (NEURONTIN) 300 MG capsule Take 2 capsules (600 mg total) by mouth 3 (three) times daily as needed (Takes 2-3 as needed). (Patient taking differently: Take 600 mg by mouth 3 (three) times daily as needed (for pain).) 90 capsule 0 Past Week   metoprolol succinate (TOPROL-XL) 100 MG 24 hr tablet Take 1 tablet (100 mg total) by mouth daily. Take with  or immediately following a meal. 30 tablet 0 Past Week   naloxone (NARCAN) nasal spray 4 mg/0.1 mL Place 4 mg into the nose as needed (overdose, as needed).   Past Month   pantoprazole (PROTONIX) 40 MG tablet Take 1 tablet (40 mg total) by mouth daily. 30 tablet 0 Past Week   rivaroxaban (XARELTO) 20 MG TABS tablet Take 1 tablet (20 mg total) by mouth daily with supper. 30 tablet 0 Past Week   spironolactone (ALDACTONE) 25 MG tablet Take 0.5 tablets (12.5 mg total) by mouth daily. 45 tablet 3 Past Week   torsemide (DEMADEX) 20 MG tablet TAKE 60MG  (3 TABLETS) IN THE MORNING AND 40MG  (2 TABLETS) IN THE EVENING 180 tablet 1 Past Week   amiodarone (PACERONE) 200 MG tablet Take 2 tablets (400 mg total) by mouth 2 (two) times daily. (Patient not taking: Reported on 08/06/2023) 120 tablet 0 Not Taking   Budeson-Glycopyrrol-Formoterol (BREZTRI AEROSPHERE) 160-9-4.8 MCG/ACT AERO Inhale 2 puffs into the lungs 2 (two) times daily. (Patient not taking: Reported  on 08/06/2023)   Not Taking   DULoxetine (CYMBALTA) 30 MG capsule Take 1 capsule (30 mg total) by mouth daily. (Patient not taking: Reported on 08/06/2023) 30 capsule 0 Not Taking   nicotine (NICODERM CQ - DOSED IN MG/24 HOURS) 14 mg/24hr patch Place 1 patch (14 mg total) onto the skin daily. (Patient not taking: Reported on 08/06/2023) 28 patch 0 Not Taking   Scheduled:   acetaminophen  650 mg Oral Q6H   ARIPiprazole  5 mg Oral Daily   aspirin  81 mg Oral Daily   [START ON 08/09/2023] aspirin  81 mg Oral Pre-Cath   atorvastatin  80 mg Oral Daily   dapagliflozin propanediol  10 mg Oral Daily   digoxin  0.125 mg Oral Daily   feeding supplement  237 mL Oral BID BM   fluticasone furoate-vilanterol  1 puff Inhalation Daily   And   umeclidinium bromide  1 puff Inhalation Daily   furosemide  80 mg Intravenous BID   metoprolol succinate  100 mg Oral Daily   pantoprazole  40 mg Oral Daily   spironolactone  25 mg Oral Daily   Infusions:   [START ON 08/09/2023] sodium chloride     amiodarone 30 mg/hr (08/08/23 1415)   heparin Stopped (08/08/23 1416)   PRN: fluticasone, gabapentin, levalbuterol, nicotine, ondansetron **OR** ondansetron (ZOFRAN) IV, oxyCODONE-acetaminophen, senna-docusate  Assessment: 77 yof with a history of HF, CAD s/p prior DES to LAD, AF, mitral regurgitation, COPD. Patient is presenting with left-sided sciatic pain and AF. Heparin per pharmacy consult placed for atrial fibrillation.  Patient is on Xarelto prior to arrival. Last dose is unknown. Outpatient . Will require aPTT monitoring due to likely falsely high anti-Xa level secondary to DOAC use.  12/29 PM update:  -aPTT is therapeutic x 2 -Heparin is charted as paused earlier, but RN has confirmed heparin is running at 1200 units/hr with no pauses  Goal of Therapy:  Heparin level 0.3-0.7 units/ml aPTT 66-102 seconds Monitor platelets by anticoagulation protocol: Yes   Plan:  Continue heparin infusion at 1200  units/hr Check aPTT & anti-Xa level daily while on heparin Continue to monitor via aPTT until levels are correlated Continue to monitor H&H and platelets  Abran Duke, PharmD, BCPS Clinical Pharmacist Phone: [phone]

## 2023-08-08 NOTE — Progress Notes (Signed)
ANTICOAGULATION CONSULT NOTE   Pharmacy Consult for Heparin Indication: atrial fibrillation  Allergies  Allergen Reactions   Pamelor [Nortriptyline Hcl] Anaphylaxis   Vibra-Tab [Doxycycline] Other (See Comments)    Unknown reaction    Patient Measurements: Height: 5\' 4"  (162.6 cm) Weight: 97.5 kg (214 lb 14.4 oz) IBW/kg (Calculated) : 54.7 Heparin Dosing Weight: 77.4 kg  Vital Signs: Temp: 98 F (36.7 C) (12/29 1305) Temp Source: Oral (12/29 1305) BP: 100/73 (12/29 1305) Pulse Rate: 78 (12/29 1305)  Labs: Recent Labs    08/06/23 1218 08/06/23 1802 08/06/23 2350 08/07/23 0850 08/08/23 0735 08/08/23 0948 08/08/23 1548  HGB 13.2  --   --   --  12.9  --   --   HCT 40.4  --   --   --  40.3  --   --   PLT 287  --   --   --  294  --   --   APTT  --    < > 55* 88* 122*  --  69*  HEPARINUNFRC  --    < > 0.99* 0.99* 0.86*  --   --   CREATININE 1.13*  --   --  0.95  --  1.19*  --    < > = values in this interval not displayed.    Estimated Creatinine Clearance: 52.7 mL/min (A) (by C-G formula based on SCr of 1.19 mg/dL (H)).   Medical History: Past Medical History:  Diagnosis Date   Anxiety    Arthritis    Attention deficit disorder (ADD)    COPD (chronic obstructive pulmonary disease) (HCC)    Depression    Drug abuse (HCC)    Fibromyalgia    HFrEF (heart failure with reduced ejection fraction) (HCC)    Hyperlipidemia    Paroxysmal Atrial fibrillation     Medications:  Medications Prior to Admission  Medication Sig Dispense Refill Last Dose/Taking   albuterol (VENTOLIN HFA) 108 (90 Base) MCG/ACT inhaler Inhale 2 puffs into the lungs every 4 (four) hours as needed for wheezing.   Past Week   ARIPiprazole (ABILIFY) 5 MG tablet Take 1 tablet (5 mg total) by mouth daily. 30 tablet 0 Past Week   aspirin (ASPIRIN CHILDRENS) 81 MG chewable tablet Chew 1 tablet (81 mg total) by mouth daily. 30 tablet 11 Past Week   atorvastatin (LIPITOR) 80 MG tablet Take 1 tablet  (80 mg total) by mouth daily. 30 tablet 0 Past Week   Budeson-Glycopyrrol-Formoterol (BREZTRI AEROSPHERE) 160-9-4.8 MCG/ACT AERO Inhale 2 puffs into the lungs in the morning and at bedtime. 10.7 g 0 Past Week   dapagliflozin propanediol (FARXIGA) 10 MG TABS tablet Take 1 tablet (10 mg total) by mouth daily. 30 tablet 0 Past Week   digoxin (LANOXIN) 0.125 MG tablet Take 1 tablet (0.125 mg total) by mouth daily. 30 tablet 3 Past Week   fluticasone (FLONASE) 50 MCG/ACT nasal spray Place 2 sprays into both nostrils daily as needed for allergies.   Past Week   gabapentin (NEURONTIN) 300 MG capsule Take 2 capsules (600 mg total) by mouth 3 (three) times daily as needed (Takes 2-3 as needed). (Patient taking differently: Take 600 mg by mouth 3 (three) times daily as needed (for pain).) 90 capsule 0 Past Week   metoprolol succinate (TOPROL-XL) 100 MG 24 hr tablet Take 1 tablet (100 mg total) by mouth daily. Take with or immediately following a meal. 30 tablet 0 Past Week   naloxone (NARCAN) nasal spray 4 mg/0.1  mL Place 4 mg into the nose as needed (overdose, as needed).   Past Month   pantoprazole (PROTONIX) 40 MG tablet Take 1 tablet (40 mg total) by mouth daily. 30 tablet 0 Past Week   rivaroxaban (XARELTO) 20 MG TABS tablet Take 1 tablet (20 mg total) by mouth daily with supper. 30 tablet 0 Past Week   spironolactone (ALDACTONE) 25 MG tablet Take 0.5 tablets (12.5 mg total) by mouth daily. 45 tablet 3 Past Week   torsemide (DEMADEX) 20 MG tablet TAKE 60MG  (3 TABLETS) IN THE MORNING AND 40MG  (2 TABLETS) IN THE EVENING 180 tablet 1 Past Week   amiodarone (PACERONE) 200 MG tablet Take 2 tablets (400 mg total) by mouth 2 (two) times daily. (Patient not taking: Reported on 08/06/2023) 120 tablet 0 Not Taking   Budeson-Glycopyrrol-Formoterol (BREZTRI AEROSPHERE) 160-9-4.8 MCG/ACT AERO Inhale 2 puffs into the lungs 2 (two) times daily. (Patient not taking: Reported on 08/06/2023)   Not Taking   DULoxetine  (CYMBALTA) 30 MG capsule Take 1 capsule (30 mg total) by mouth daily. (Patient not taking: Reported on 08/06/2023) 30 capsule 0 Not Taking   nicotine (NICODERM CQ - DOSED IN MG/24 HOURS) 14 mg/24hr patch Place 1 patch (14 mg total) onto the skin daily. (Patient not taking: Reported on 08/06/2023) 28 patch 0 Not Taking   Scheduled:   acetaminophen  650 mg Oral Q6H   ARIPiprazole  5 mg Oral Daily   aspirin  81 mg Oral Daily   [START ON 08/09/2023] aspirin  81 mg Oral Pre-Cath   atorvastatin  80 mg Oral Daily   dapagliflozin propanediol  10 mg Oral Daily   digoxin  0.125 mg Oral Daily   feeding supplement  237 mL Oral BID BM   fluticasone furoate-vilanterol  1 puff Inhalation Daily   And   umeclidinium bromide  1 puff Inhalation Daily   furosemide  80 mg Intravenous BID   metoprolol succinate  100 mg Oral Daily   pantoprazole  40 mg Oral Daily   spironolactone  25 mg Oral Daily   Infusions:   [START ON 08/09/2023] sodium chloride     amiodarone 30 mg/hr (08/08/23 1415)   heparin Stopped (08/08/23 1416)   PRN: fluticasone, gabapentin, levalbuterol, nicotine, ondansetron **OR** ondansetron (ZOFRAN) IV, oxyCODONE-acetaminophen, senna-docusate  Assessment: 19 yof with a history of HF, CAD s/p prior DES to LAD, AF, mitral regurgitation, COPD. Patient is presenting with left-sided sciatic pain and AF. Heparin per pharmacy consult placed for atrial fibrillation.  Patient is on Xarelto prior to arrival. Last dose is unknown. Outpatient . Will require aPTT monitoring due to likely falsely high anti-Xa level secondary to DOAC use.  12/29 AM: aPTT 122 and heparin level 0.86, both supra-therapeutic on 1350 units/hr. Will continue monitoring both aPTT and heparin levels until both are therapeutic to ensure levels are correlating. No issues with heparin infusion running or signs of bleeding per RN. CBC stable (Hgb 12.9, PLT 294).   12/29 PM: aPTT is therapeutic at 69  Goal of Therapy:  Heparin  level 0.3-0.7 units/ml aPTT 66-102 seconds Monitor platelets by anticoagulation protocol: Yes   Plan:  Continue heparin infusion at 1200 units/hr Check aPTT & anti-Xa level daily while on heparin Continue to monitor via aPTT until levels are correlated Continue to monitor H&H and platelets    Thank you for allowing pharmacy to be a part of this patients care.   Signe Colt, PharmD 08/08/2023 4:28 PM  **Pharmacist phone directory can  be found on [email or web address] listed under [facility]**
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
## 2023-08-08 NOTE — H&P (View-Only) (Signed)
Patient ID: Autumn [name], female   DOB: [birth year], 67 y.o.   MRN: [id number]     Advanced Heart Failure Rounding Note  Cardiologist: Thomasene Ripple, DO   Subjective:   Chief Complaint: Shortness of breath  I/Os net negative 1896 with weight down.  Still with dyspnea.  She remains in atrial fibrillation, rate-controlled on amiodarone gtt.    Objective:   Weight Range: 97.5 kg Body mass index is 36.89 kg/m.   Vital Signs:   Temp:  [98 F (36.7 C)-98.7 F (37.1 C)] 98 F (36.7 C) (12/29 0841) Pulse Rate:  [73-121] 97 (12/29 0841) Resp:  [12-20] 12 (12/29 0841) BP: (93-156)/(63-130) 127/84 (12/29 0841) SpO2:  [95 %-99 %] 95 % (12/29 0841) Weight:  [97.5 kg-98.9 kg] 97.5 kg (12/29 0349) Last BM Date : 08/07/23  Weight change: Filed Weights   08/06/23 1521 08/07/23 1528 08/08/23 0349  Weight: 98.4 kg 98.9 kg 97.5 kg    Intake/Output:   Intake/Output Summary (Last 24 hours) at 08/08/2023 1132 Last data filed at 08/08/2023 1042 Gross per 24 hour  Intake 1413.82 ml  Output 3800 ml  Net -2386.18 ml      Physical Exam    General: NAD Neck: JVP 12 cm, no thyromegaly or thyroid nodule.  Lungs: Clear to auscultation bilaterally with normal respiratory effort. CV: Nondisplaced PMI.  Heart irregular S1/S2, no S3/S4, no murmur.  Trace ankle edema.    Abdomen: Soft, nontender, no hepatosplenomegaly, no distention.  Skin: Intact without lesions or rashes.  Neurologic: Alert and oriented x 3.  Psych: Normal affect. Extremities: No clubbing or cyanosis.  HEENT: Normal.   Telemetry   Atrial fibrillation rate 80s (personally reviewed)  Labs    CBC Recent Labs    08/06/23 1218 08/08/23 0735  WBC 7.3 6.3  HGB 13.2 12.9  HCT 40.4 40.3  MCV 83.3 86.3  PLT 287 294   Basic Metabolic Panel Recent Labs    40/98/11 0543 08/07/23 0850 08/08/23 0948  NA  --  133* 135  K  --  2.9* 4.2  CL  --  94* 98  CO2  --  27 26  GLUCOSE  --  256* 138*  BUN  --  12 17   CREATININE  --  0.95 1.19*  CALCIUM  --  9.0 9.0  MG 2.0  --   --   PHOS 4.9*  --   --    Liver Function Tests No results for input(s): "AST", "ALT", "ALKPHOS", "BILITOT", "PROT", "ALBUMIN" in the last 72 hours. No results for input(s): "LIPASE", "AMYLASE" in the last 72 hours. Cardiac Enzymes No results for input(s): "CKTOTAL", "CKMB", "CKMBINDEX", "TROPONINI" in the last 72 hours.  BNP: BNP (last 3 results) Recent Labs    05/31/23 0712 07/02/23 1153 08/06/23 1802  BNP 606.9* 411.5* 238.2*    ProBNP (last 3 results) No results for input(s): "PROBNP" in the last 8760 hours.   D-Dimer No results for input(s): "DDIMER" in the last 72 hours. Hemoglobin A1C No results for input(s): "HGBA1C" in the last 72 hours. Fasting Lipid Panel No results for input(s): "CHOL", "HDL", "LDLCALC", "TRIG", "CHOLHDL", "LDLDIRECT" in the last 72 hours. Thyroid Function Tests Recent Labs    08/07/23 0543  TSH 2.242    Other results:   Imaging    No results found.    Medications:     Scheduled Medications:  acetaminophen  650 mg Oral Q6H   ARIPiprazole  5 mg Oral Daily   aspirin  81 mg Oral Daily   atorvastatin  80 mg Oral Daily   dapagliflozin propanediol  10 mg Oral Daily   digoxin  0.125 mg Oral Daily   feeding supplement  237 mL Oral BID BM   fluticasone furoate-vilanterol  1 puff Inhalation Daily   And   umeclidinium bromide  1 puff Inhalation Daily   furosemide  80 mg Intravenous BID   gabapentin  600 mg Oral TID   metoprolol succinate  100 mg Oral Daily   pantoprazole  40 mg Oral Daily   spironolactone  25 mg Oral Daily    Infusions:  amiodarone 30 mg/hr (08/07/23 2339)   heparin 1,200 Units/hr (08/08/23 0947)    PRN Medications: fluticasone, levalbuterol, nicotine, ondansetron **OR** ondansetron (ZOFRAN) IV, oxyCODONE-acetaminophen, senna-docusate   Assessment/Plan   1. Acute on chronic systolic CHF: Echo in 6/22 with EF 60-65%.  Has had multiple  echoes in 2024, each with lower EF.  Most recent echo in 10/24 with EF 30-35%, moderate-severe MR.  Cause of cardiomyopathy is uncertain.  She has a history of CAD but denies recent chest pain.  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  She was admitted with volume overload and dyspnea.  She remains volume overloaded on exam but diuresing well and weight is down.  - Continue Lasix 80 mg IV bid today.  - Replace K aggressively.  - Continue Farxiga 10 daily - Continue spironolactone 25 daily - Continue digoxin 0.125 daily - Continue Toprol XL 100 daily - Can add Entresto if BP remains stable, will wait until after cath.  - I will arrange for [facility] Monday to assess filling pressures and rule out progressive CAD as cause of cardiomyopathy.  - Will need TEE-guided DCCV after she is fully diuresed.  2. Atrial fibrillation: Persistent, multiple admissions with AF/RVR.  Says she has never been cardioverted.  On Xarelto at home, heparin gtt currently.  - Continue amiodarone gtt 30 mg/hr.  - Continue Toprol XL and digoxin.  - Continue heparin gtt.  - She will need TEE-guided DCCV after diuresis.  - Would favor eventual AF ablation.  3. 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  - Would reassess when in NSR.  4. CAD: DES to LAD in 9/20.  No recent chest pain.  - Given fall in EF, recommend LHC/RHC on Monday.  Discussed risks/benefits with patient and she agrees to procedure.  - Continue ASA 81 and atorvastatin.  5. COPD: She smokes and uses home oxygen at times.  - Needs to quit smoking.  6. Hypokalemia: Aggressive repletion today.  7. Sciatica: Significant pain.  Per [facility].  8. OSA: Suspected.  - Will need sleep study.   Marca Ancona 08/08/2023 11:32 AM   Length of Stay: 2  Marca Ancona, MD  08/08/2023, 11:32 AM  Advanced Heart Failure Team Pager [phone] (M-F; 7a - 5p)  Please contact  [facility] for night-coverage after hours (5p -7a ) and weekends on [email or web address]

## 2023-08-08 NOTE — Progress Notes (Addendum)
ANTICOAGULATION CONSULT NOTE   Pharmacy Consult for Heparin Indication: atrial fibrillation  Allergies  Allergen Reactions   Pamelor [Nortriptyline Hcl] Anaphylaxis   Vibra-Tab [Doxycycline] Other (See Comments)    Unknown reaction    Patient Measurements: Height: 5\' 4"  (162.6 cm) Weight: 97.5 kg (214 lb 14.4 oz) IBW/kg (Calculated) : 54.7 Heparin Dosing Weight: 77.4 kg  Vital Signs: Temp: 98 F (36.7 C) (12/29 0841) Temp Source: Oral (12/29 0841) BP: 127/84 (12/29 0841) Pulse Rate: 97 (12/29 0841)  Labs: Recent Labs    08/06/23 1218 08/06/23 1802 08/06/23 2350 08/07/23 0850 08/08/23 0735  HGB 13.2  --   --   --  12.9  HCT 40.4  --   --   --  40.3  PLT 287  --   --   --  294  APTT  --    < > 55* 88* 122*  HEPARINUNFRC  --    < > 0.99* 0.99* 0.86*  CREATININE 1.13*  --   --  0.95  --    < > = values in this interval not displayed.    Estimated Creatinine Clearance: 66 mL/min (by C-G formula based on SCr of 0.95 mg/dL).   Medical History: Past Medical History:  Diagnosis Date   Anxiety    Arthritis    Attention deficit disorder (ADD)    COPD (chronic obstructive pulmonary disease) (HCC)    Depression    Drug abuse (HCC)    Fibromyalgia    HFrEF (heart failure with reduced ejection fraction) (HCC)    Hyperlipidemia    Paroxysmal Atrial fibrillation     Medications:  Medications Prior to Admission  Medication Sig Dispense Refill Last Dose/Taking   albuterol (VENTOLIN HFA) 108 (90 Base) MCG/ACT inhaler Inhale 2 puffs into the lungs every 4 (four) hours as needed for wheezing.   Past Week   ARIPiprazole (ABILIFY) 5 MG tablet Take 1 tablet (5 mg total) by mouth daily. 30 tablet 0 Past Week   aspirin (ASPIRIN CHILDRENS) 81 MG chewable tablet Chew 1 tablet (81 mg total) by mouth daily. 30 tablet 11 Past Week   atorvastatin (LIPITOR) 80 MG tablet Take 1 tablet (80 mg total) by mouth daily. 30 tablet 0 Past Week   Budeson-Glycopyrrol-Formoterol (BREZTRI  AEROSPHERE) 160-9-4.8 MCG/ACT AERO Inhale 2 puffs into the lungs in the morning and at bedtime. 10.7 g 0 Past Week   dapagliflozin propanediol (FARXIGA) 10 MG TABS tablet Take 1 tablet (10 mg total) by mouth daily. 30 tablet 0 Past Week   digoxin (LANOXIN) 0.125 MG tablet Take 1 tablet (0.125 mg total) by mouth daily. 30 tablet 3 Past Week   fluticasone (FLONASE) 50 MCG/ACT nasal spray Place 2 sprays into both nostrils daily as needed for allergies.   Past Week   gabapentin (NEURONTIN) 300 MG capsule Take 2 capsules (600 mg total) by mouth 3 (three) times daily as needed (Takes 2-3 as needed). (Patient taking differently: Take 600 mg by mouth 3 (three) times daily as needed (for pain).) 90 capsule 0 Past Week   metoprolol succinate (TOPROL-XL) 100 MG 24 hr tablet Take 1 tablet (100 mg total) by mouth daily. Take with or immediately following a meal. 30 tablet 0 Past Week   naloxone (NARCAN) nasal spray 4 mg/0.1 mL Place 4 mg into the nose as needed (overdose, as needed).   Past Month   pantoprazole (PROTONIX) 40 MG tablet Take 1 tablet (40 mg total) by mouth daily. 30 tablet 0 Past Week  rivaroxaban (XARELTO) 20 MG TABS tablet Take 1 tablet (20 mg total) by mouth daily with supper. 30 tablet 0 Past Week   spironolactone (ALDACTONE) 25 MG tablet Take 0.5 tablets (12.5 mg total) by mouth daily. 45 tablet 3 Past Week   torsemide (DEMADEX) 20 MG tablet TAKE 60MG  (3 TABLETS) IN THE MORNING AND 40MG  (2 TABLETS) IN THE EVENING 180 tablet 1 Past Week   amiodarone (PACERONE) 200 MG tablet Take 2 tablets (400 mg total) by mouth 2 (two) times daily. (Patient not taking: Reported on 08/06/2023) 120 tablet 0 Not Taking   Budeson-Glycopyrrol-Formoterol (BREZTRI AEROSPHERE) 160-9-4.8 MCG/ACT AERO Inhale 2 puffs into the lungs 2 (two) times daily. (Patient not taking: Reported on 08/06/2023)   Not Taking   DULoxetine (CYMBALTA) 30 MG capsule Take 1 capsule (30 mg total) by mouth daily. (Patient not taking: Reported on  08/06/2023) 30 capsule 0 Not Taking   nicotine (NICODERM CQ - DOSED IN MG/24 HOURS) 14 mg/24hr patch Place 1 patch (14 mg total) onto the skin daily. (Patient not taking: Reported on 08/06/2023) 28 patch 0 Not Taking   Scheduled:   acetaminophen  650 mg Oral Q6H   ARIPiprazole  5 mg Oral Daily   aspirin  81 mg Oral Daily   atorvastatin  80 mg Oral Daily   dapagliflozin propanediol  10 mg Oral Daily   digoxin  0.125 mg Oral Daily   feeding supplement  237 mL Oral BID BM   fluticasone furoate-vilanterol  1 puff Inhalation Daily   And   umeclidinium bromide  1 puff Inhalation Daily   furosemide  80 mg Intravenous BID   gabapentin  600 mg Oral TID   metoprolol succinate  100 mg Oral Daily   pantoprazole  40 mg Oral Daily   potassium chloride  40 mEq Oral Q3H   spironolactone  25 mg Oral Daily   Infusions:   amiodarone 30 mg/hr (08/07/23 2339)   heparin 1,350 Units/hr (08/07/23 1358)   PRN: fluticasone, levalbuterol, nicotine, ondansetron **OR** ondansetron (ZOFRAN) IV, oxyCODONE-acetaminophen, senna-docusate  Assessment: 33 yof with a history of HF, CAD s/p prior DES to LAD, AF, mitral regurgitation, COPD. Patient is presenting with left-sided sciatic pain and AF. Heparin per pharmacy consult placed for atrial fibrillation.  Patient is on Xarelto prior to arrival. Last dose is unknown. Outpatient . Will require aPTT monitoring due to likely falsely high anti-Xa level secondary to DOAC use.  12/29 AM: aPTT 122 and heparin level 0.86, both supra-therapeutic on 1350 units/hr. Will continue monitoring both aPTT and heparin levels until both are therapeutic to ensure levels are correlating. No issues with heparin infusion running or signs of bleeding per RN. CBC stable (Hgb 12.9, PLT 294).   Goal of Therapy:  Heparin level 0.3-0.7 units/ml aPTT 66-102 seconds Monitor platelets by anticoagulation protocol: Yes   Plan:  Decrease heparin infusion to 1200 units/hr Recheck aPTT in 6  hours Check aPTT & anti-Xa level daily while on heparin Continue to monitor via aPTT until levels are correlated Continue to monitor H&H and platelets  Enos Fling, PharmD PGY-1 Acute Care Pharmacy Resident 08/08/2023 9:19 AM

## 2023-08-08 NOTE — Plan of Care (Signed)
  Problem: Education: Goal: Knowledge of General Education information will improve Description: Including pain rating scale, medication(s)/side effects and non-pharmacologic comfort measures Outcome: Progressing   Problem: Health Behavior/Discharge Planning: Goal: Ability to manage health-related needs will improve Outcome: Progressing   Problem: Clinical Measurements: Goal: Ability to maintain clinical measurements within normal limits will improve Outcome: Progressing Goal: Will remain free from infection Outcome: Progressing Goal: Diagnostic test results will improve Outcome: Progressing Goal: Respiratory complications will improve Outcome: Progressing Goal: Cardiovascular complication will be avoided Outcome: Progressing   Problem: Activity: Goal: Risk for activity intolerance will decrease Outcome: Progressing   Problem: Nutrition: Goal: Adequate nutrition will be maintained Outcome: Progressing   Problem: Coping: Goal: Level of anxiety will decrease Outcome: Progressing   Problem: Elimination: Goal: Will not experience complications related to bowel motility Outcome: Progressing Goal: Will not experience complications related to urinary retention Outcome: Progressing   Problem: Pain Management: Goal: General experience of comfort will improve Outcome: Progressing   Problem: Safety: Goal: Ability to remain free from injury will improve Outcome: Progressing   Problem: Skin Integrity: Goal: Risk for impaired skin integrity will decrease Outcome: Progressing   Problem: Education: Goal: Knowledge of disease or condition will improve Outcome: Progressing Goal: Understanding of medication regimen will improve Outcome: Progressing Goal: Individualized Educational Video(s) Outcome: Progressing   Problem: Activity: Goal: Ability to tolerate increased activity will improve Outcome: Progressing   Problem: Cardiac: Goal: Ability to achieve and maintain  adequate cardiopulmonary perfusion will improve Outcome: Progressing   Problem: Health Behavior/Discharge Planning: Goal: Ability to safely manage health-related needs after discharge will improve Outcome: Progressing   Problem: Education: Goal: Understanding of CV disease, CV risk reduction, and recovery process will improve Outcome: Progressing Goal: Individualized Educational Video(s) Outcome: Progressing   Problem: Activity: Goal: Ability to return to baseline activity level will improve Outcome: Progressing   Problem: Cardiovascular: Goal: Ability to achieve and maintain adequate cardiovascular perfusion will improve Outcome: Progressing Goal: Vascular access site(s) Level 0-1 will be maintained Outcome: Progressing   Problem: Health Behavior/Discharge Planning: Goal: Ability to safely manage health-related needs after discharge will improve Outcome: Progressing

## 2023-08-08 NOTE — Progress Notes (Addendum)
PROGRESS NOTE    Autumn [name]  AVW:[id number] DOB: 10/24/1955 DOA: 08/06/2023 PCP: Vivien Presto, MD  67/F w chronic systolic CHF, AFib, CAD, 2L O2-dependent COPD, anxiety/depression/ADHD, fibromyalgia, HLD, OA who presented to the ED on 08/06/2023 from her cardiologist office for evaluation of sciatic lower back/left leg pain and AFib with RVR. She was found to be volume overloaded, so IV lasix, amiodarone and heparin infusions were started, and the patient admitted with cardiology consultation. XR demonstrates progressive degenerative disc disease involving L3-L4-L5 levels.     Subjective: Anxious and tearful, worried if her heart will improve, worried about her back pain  Assessment and Plan:  A-fib with RVR: - History of persistent A-fib with with previous cardioversion unsuccessful.  - Cardiology plans to attempt TEE/DCCV once patient has been diuresed   - Continue amiodarone gtt.and heparin gtt. - Continue metoprolol succinate,  digoxin, level only 0.3.  -Potassium supplemented, check mag in a.m.   Acute on chronic systolic CHF  -TTE in 05/2023 showed EF 30-35%, global HK, severe LAE, mod-severe MR. -CHF team following, on IV Lasix, Farxiga, Aldactone, Toprol, digoxin -plan for right and left heart cath tomorrow to rule out CAD  -Plan for cardioversion after diuresis this admission  - Monitor urine output, BMP in a.m.   Advanced lumbar DDD, widespread OA, fibromyalgia: Known to have severe arthritis, has been seen by orthopedics in [facility] system previously.  - Continue oral analgesics including scheduled gabapentin and tylenol as well as prn oxycodone.  -not taking cymbalta  -Continue PT/OT - Will need orthopedic/spine surgery follow up for this chronic progressive condition   Hypokalemia:  -Supplemented, improved  Hyperglycemia -Check HbA1c  CAD: s/p DES to LAD in 2020.  - Continue ASA, BB, statin.  -Plan for right and left heart cath tomorrow   Chronic hypoxic  respiratory failure, COPD: Quiescent - Continue home 2L O2.  - Continue breztri and prn xopenex   OSA: Not on CPAP therapy. -Outpatient follow-up with pulmonology   Anxiety, depression: Not on home medications.  - Continue aripiprazole, consider prn   Morbid obesity: Body mass index is 37.24 kg/m.    Tobacco use:  - Nicotine patch prn   GERD:  - PPI   DVT prophylaxis: IV heparin Code Status: Full code Family Communication: None present Disposition Plan: Home likely 48 hours  Consultants:    Procedures:   Antimicrobials:    Objective: Vitals:   08/07/23 1528 08/07/23 2335 08/08/23 0349 08/08/23 0841  BP: 105/69 93/63 109/68 127/84  Pulse: 79 78 73 97  Resp: 20 20 16 12   Temp: 98.7 F (37.1 C) 98.1 F (36.7 C) 98.3 F (36.8 C) 98 F (36.7 C)  TempSrc: Oral Oral Oral Oral  SpO2: 95% 96% 99% 95%  Weight: 98.9 kg  97.5 kg   Height: 5\' 4"  (1.626 m)       Intake/Output Summary (Last 24 hours) at 08/08/2023 1045 Last data filed at 08/08/2023 1042 Gross per 24 hour  Intake 1053.82 ml  Output 3800 ml  Net -2746.18 ml   Filed Weights   08/06/23 1521 08/07/23 1528 08/08/23 0349  Weight: 98.4 kg 98.9 kg 97.5 kg    Examination:  General exam: Obese chronically ill female sitting up in bed, AAOx3 HEENT: Positive JVD CVS: S1-S2, irregular rhythm Lungs: Few rhonchi Abdomen: Soft, obese, nontender, bowel sounds present Extremities: Trace edema Skin: No rashes Psychiatry:  Mood & affect appropriate.     Data Reviewed:   CBC: Recent Labs  Lab 08/06/23 1218 08/08/23 0735  WBC 7.3 6.3  HGB 13.2 12.9  HCT 40.4 40.3  MCV 83.3 86.3  PLT 287 294   Basic Metabolic Panel: Recent Labs  Lab 08/06/23 1218 08/07/23 0543 08/07/23 0850 08/08/23 0948  NA 133*  --  133* 135  K 2.8*  --  2.9* 4.2  CL 94*  --  94* 98  CO2 27  --  27 26  GLUCOSE 102*  --  256* 138*  BUN 14  --  12 17  CREATININE 1.13*  --  0.95 1.19*  CALCIUM 9.1  --  9.0 9.0  MG  --   2.0  --   --   PHOS  --  4.9*  --   --    GFR: Estimated Creatinine Clearance: 52.7 mL/min (A) (by C-G formula based on SCr of 1.19 mg/dL (H)). Liver Function Tests: No results for input(s): "AST", "ALT", "ALKPHOS", "BILITOT", "PROT", "ALBUMIN" in the last 168 hours. No results for input(s): "LIPASE", "AMYLASE" in the last 168 hours. No results for input(s): "AMMONIA" in the last 168 hours. Coagulation Profile: No results for input(s): "INR", "PROTIME" in the last 168 hours. Cardiac Enzymes: No results for input(s): "CKTOTAL", "CKMB", "CKMBINDEX", "TROPONINI" in the last 168 hours. BNP (last 3 results) No results for input(s): "PROBNP" in the last 8760 hours. HbA1C: No results for input(s): "HGBA1C" in the last 72 hours. CBG: No results for input(s): "GLUCAP" in the last 168 hours. Lipid Profile: No results for input(s): "CHOL", "HDL", "LDLCALC", "TRIG", "CHOLHDL", "LDLDIRECT" in the last 72 hours. Thyroid Function Tests: Recent Labs    08/07/23 0543  TSH 2.242   Anemia Panel: No results for input(s): "VITAMINB12", "FOLATE", "FERRITIN", "TIBC", "IRON", "RETICCTPCT" in the last 72 hours. Urine analysis:    Component Value Date/Time   COLORURINE STRAW (A) 01/13/2021 1234   APPEARANCEUR CLEAR 01/13/2021 1234   LABSPEC 1.003 (L) 01/13/2021 1234   PHURINE 5.0 01/13/2021 1234   GLUCOSEU NEGATIVE 01/13/2021 1234   HGBUR NEGATIVE 01/13/2021 1234   BILIRUBINUR NEGATIVE 01/13/2021 1234   KETONESUR NEGATIVE 01/13/2021 1234   PROTEINUR NEGATIVE 01/13/2021 1234   UROBILINOGEN 0.2 12/06/2012 1020   NITRITE NEGATIVE 01/13/2021 1234   LEUKOCYTESUR NEGATIVE 01/13/2021 1234   Sepsis Labs: @LABRCNTIP (procalcitonin:4,lacticidven:4)  )No results found for this or any previous visit (from the past 240 hours).   Radiology Studies: DG Hip Unilat W or Wo Pelvis 2-3 Views Left Result Date: 08/06/2023 CLINICAL DATA:  Pain. EXAM: DG HIP (WITH OR WITHOUT PELVIS) 3V LEFT COMPARISON:   10/28/2010. FINDINGS: There is bilateral hip and sacroiliac degenerative change with joint space narrowing and small osteophytes. Pelvic ring is intact. No acute fracture, dislocation or subluxation. No osteolytic or osteoblastic lesions. IMPRESSION: Bilateral hip degenerative changes.  No acute osseous abnormalities. Electronically Signed   By: Layla Maw M.D.   On: 08/06/2023 16:23   DG Lumbar Spine 2-3 Views Result Date: 08/06/2023 CLINICAL DATA:  Pain. EXAM: LUMBAR SPINE 3  VIEW COMPARISON:  10/28/2010. FINDINGS: Significantly worse multilevel lumbar degenerative disc disease with disc space narrowing, sclerosis and marginal osteophytes most severe at the L3-L4-L5 levels. No significant compression deformities. Aortoiliac atheromatous calcifications IMPRESSION: Worsening degenerative changes. Electronically Signed   By: Layla Maw M.D.   On: 08/06/2023 16:21   DG Chest Port 1 View Result Date: 08/06/2023 CLINICAL DATA:  Atrial fibrillation EXAM: PORTABLE CHEST 1 VIEW COMPARISON:  05/31/2023 FINDINGS: Single frontal view of the chest demonstrates mild enlargement the cardiac silhouette. No  airspace disease, effusion, or pneumothorax. No acute bony abnormalities. IMPRESSION: 1. Mild enlargement of the cardiac silhouette. 2. No acute airspace disease. Electronically Signed   By: Sharlet Salina M.D.   On: 08/06/2023 15:15     Scheduled Meds:  acetaminophen  650 mg Oral Q6H   ARIPiprazole  5 mg Oral Daily   aspirin  81 mg Oral Daily   atorvastatin  80 mg Oral Daily   dapagliflozin propanediol  10 mg Oral Daily   digoxin  0.125 mg Oral Daily   feeding supplement  237 mL Oral BID BM   fluticasone furoate-vilanterol  1 puff Inhalation Daily   And   umeclidinium bromide  1 puff Inhalation Daily   furosemide  80 mg Intravenous BID   gabapentin  600 mg Oral TID   metoprolol succinate  100 mg Oral Daily   pantoprazole  40 mg Oral Daily   potassium chloride  40 mEq Oral Q3H    spironolactone  25 mg Oral Daily   Continuous Infusions:  amiodarone 30 mg/hr (08/07/23 2339)   heparin 1,200 Units/hr (08/08/23 0947)     LOS: 2 days    Time spent:    Zannie Cove, MD [facility]   08/08/2023, 10:45 AM

## 2023-08-08 NOTE — Evaluation (Signed)
Physical Therapy Evaluation Patient Details Name: Autumn [name] MRN: [id number] DOB: Dec 31, 1955 Today's Date: 08/08/2023  History of Present Illness  Pt is a 67 y.o. female who presented 08/06/23 as a referral from her cardiologist due to sciatic lower back/left leg pain and Afib with RVR. Found to be volume overloaded. Admitted with acute on chronic systolic CHF. XR demonstrates progressive degenerative disc disease involving L3-L4-L5 levels. PMH of afib, substance use disorder, parkinson's disease, COPD, fibromyalgia, HLD, and systolic heart failure.   Clinical Impression  Pt presents with condition above and deficits mentioned below, see PT Problem List. PTA, she was mod I utilizing a rollator for functional mobility, living with her granddaughter-in-law in a 3-level house with 3 STE and x6 stairs to access the shower. She sleeps on a futon on the main level. Currently, pt is limited in mobility, balance, and activity tolerance by her radicular L lower extremity pain. The pain was reproduced with palpation of her muscles in her L buttocks, ambulating, and with knee extension and ankle dorsiflexion (beginning of slump test, a sciatic nerve tension test). She is currently performing transfers with supervision and ambulating with a rollator with CGA for safety. She would benefit from further acute PT services and follow-up with HHPT to try to address her deficits and reduce her pain.      If plan is discharge home, recommend the following: A little help with bathing/dressing/bathroom;Assistance with cooking/housework;Assist for transportation;Help with stairs or ramp for entrance   Can travel by private vehicle        Equipment Recommendations Hospital bed (per pt request)  Recommendations for Other Services       Functional Status Assessment Patient has had a recent decline in their functional status and demonstrates the ability to make significant improvements in function in a reasonable  and predictable amount of time.     Precautions / Restrictions Precautions Precautions: Fall Precaution Comments: watch SpO2 Restrictions Weight Bearing Restrictions Per Provider Order: No      Mobility  Bed Mobility               General bed mobility comments: Pt sitting EOB upon arrival, sitting in recliner end of session    Transfers Overall transfer level: Needs assistance Equipment used: Rollator (4 wheels) Transfers: Sit to/from Stand Sit to Stand: Supervision           General transfer comment: Pt pulls up on rollator to stand, x2 reps from EOB, supervision for safety, no LOB    Ambulation/Gait Ambulation/Gait assistance: Contact guard assist Gait Distance (Feet): 110 Feet (x2 bouts of ~110 ft > ~25 ft) Assistive device: Rollator (4 wheels) Gait Pattern/deviations: Step-through pattern, Decreased stride length, Trunk flexed Gait velocity: reduced Gait velocity interpretation: <1.8 ft/sec, indicate of risk for recurrent falls   General Gait Details: Pt takes slow, short steps with a flexed posture, limited by L leg pain. No LOB, CGA for safety  Stairs            Wheelchair Mobility     Tilt Bed    Modified Rankin (Stroke Patients Only)       Balance Overall balance assessment: Needs assistance Sitting-balance support: No upper extremity supported, Feet supported Sitting balance-Leahy Scale: Fair     Standing balance support: Bilateral upper extremity supported, During functional activity, Reliant on assistive device for balance Standing balance-Leahy Scale: Poor Standing balance comment: Reliant on rollator  Pertinent Vitals/Pain Pain Assessment Pain Assessment: Faces Faces Pain Scale: Hurts even more Pain Location: L leg Pain Descriptors / Indicators: Discomfort, Grimacing, Guarding, Moaning, Aching, Spasm, Shooting Pain Intervention(s): Limited activity within patient's tolerance, Monitored  during session, Premedicated before session, Repositioned    Home Living Family/patient expects to be discharged to:: Private residence Living Arrangements: Other relatives (gradnddaughter-in-law) Available Help at Discharge: Family;Available 24 hours/day Type of Home: House Home Access: Stairs to enter Entrance Stairs-Rails: Can reach both Entrance Stairs-Number of Steps: 3 Alternate Level Stairs-Number of Steps: 6 Home Layout: Multi-level;Able to live on main level with bedroom/bathroom;1/2 bath on main level (shower upstairs) Home Equipment: Rollator (4 wheels);Cane - quad;Shower Scientist, physiological (2 wheels) Additional Comments: on 2L O2 PRN; been sleeping on futon, reports need for hospital bed    Prior Function Prior Level of Function : Independent/Modified Independent             Mobility Comments: Uses rollator ADLs Comments: Family drives her; family cooks and cleans, she can do light cooking; mod I bathing and dressing     Extremity/Trunk Assessment   Upper Extremity Assessment Upper Extremity Assessment: Defer to OT evaluation    Lower Extremity Assessment Lower Extremity Assessment: Generalized weakness;LLE deficits/detail LLE Deficits / Details: pain radiating down to knee (reports intermittently goes down into lower leg but not into foot); numbness noted at thigh; MMT scores of 4+ to 5 grossly bil; pain reproduced with palpation of buttocks musculature and with seated knee extension PROM (begins to worsen at approximately -45' knee extension), worsening even more so with ankle dorsiflexion PROM added to this position LLE Sensation: decreased light touch    Cervical / Trunk Assessment Cervical / Trunk Assessment: Normal  Communication   Communication Communication: No apparent difficulties  Cognition Arousal: Alert Behavior During Therapy: WFL for tasks assessed/performed Overall Cognitive Status: Within Functional Limits for tasks assessed                                  General Comments: Can be distracted by pain at times and needs cues to wait for therapist to arrange lines before mobilzing        General Comments General comments (skin integrity, edema, etc.): VSS on 3L O2    Exercises     Assessment/Plan    PT Assessment Patient needs continued PT services  PT Problem List Decreased activity tolerance;Decreased balance;Decreased mobility;Impaired sensation;Pain       PT Treatment Interventions DME instruction;Gait training;Stair training;Functional mobility training;Therapeutic activities;Therapeutic exercise;Balance training;Neuromuscular re-education;Patient/family education    PT Goals (Current goals can be found in the Care Plan section)  Acute Rehab PT Goals Patient Stated Goal: to reduce pain PT Goal Formulation: With patient Time For Goal Achievement: 08/22/23 Potential to Achieve Goals: Good    Frequency Min 1X/week     Co-evaluation               AM-PAC PT "6 Clicks" Mobility  Outcome Measure Help needed turning from your back to your side while in a flat bed without using bedrails?: A Little Help needed moving from lying on your back to sitting on the side of a flat bed without using bedrails?: A Little Help needed moving to and from a bed to a chair (including a wheelchair)?: A Little Help needed standing up from a chair using your arms (e.g., wheelchair or bedside chair)?: A Little Help needed to walk in hospital room?:  A Little Help needed climbing 3-5 steps with a railing? : A Little 6 Click Score: 18    End of Session Equipment Utilized During Treatment: Gait belt;Oxygen Activity Tolerance: Patient limited by pain Patient left: in chair;with call bell/phone within reach;with chair alarm set   PT Visit Diagnosis: Unsteadiness on feet (R26.81);Other abnormalities of gait and mobility (R26.89);Difficulty in walking, not elsewhere classified (R26.2);Pain Pain - Right/Left:  Left Pain - part of body: Leg    Time: 1329-1400 PT Time Calculation (min) (ACUTE ONLY): 31 min   Charges:   PT Evaluation $PT Eval Moderate Complexity: 1 Mod PT Treatments $Therapeutic Activity: 8-22 mins PT General Charges $$ ACUTE PT VISIT: 1 Visit         Virgil Benedict, PT, DPT Acute Rehabilitation Services  Office: [phone]   Bettina Gavia 08/08/2023, 2:34 PM

## 2023-08-09 ENCOUNTER — Other Ambulatory Visit: Payer: Self-pay

## 2023-08-09 ENCOUNTER — Encounter (HOSPITAL_COMMUNITY)
Admission: EM | Disposition: A | Discharge status: Home / Self Care | Payer: Self-pay | Source: Home / Self Care | Attending: Internal Medicine

## 2023-08-09 ENCOUNTER — Inpatient Hospital Stay (HOSPITAL_COMMUNITY): Payer: 59

## 2023-08-09 DIAGNOSIS — I4891 Unspecified atrial fibrillation: Secondary | ICD-10-CM | POA: Diagnosis not present

## 2023-08-09 DIAGNOSIS — I2510 Atherosclerotic heart disease of native coronary artery without angina pectoris: Secondary | ICD-10-CM

## 2023-08-09 DIAGNOSIS — I5023 Acute on chronic systolic (congestive) heart failure: Secondary | ICD-10-CM | POA: Diagnosis not present

## 2023-08-09 HISTORY — PX: RIGHT/LEFT HEART CATH AND CORONARY ANGIOGRAPHY: CATH118266

## 2023-08-09 LAB — HEPARIN LEVEL (UNFRACTIONATED): Heparin Unfractionated: 0.49 [IU]/mL (ref 0.30–0.70)

## 2023-08-09 LAB — POCT I-STAT EG7
Acid-Base Excess: 1 mmol/L (ref 0.0–2.0)
Acid-Base Excess: 4 mmol/L — ABNORMAL HIGH (ref 0.0–2.0)
Bicarbonate: 28 mmol/L (ref 20.0–28.0)
Bicarbonate: 30.8 mmol/L — ABNORMAL HIGH (ref 20.0–28.0)
Calcium, Ion: 1.16 mmol/L (ref 1.15–1.40)
Calcium, Ion: 1.19 mmol/L (ref 1.15–1.40)
HCT: 38 % (ref 36.0–46.0)
HCT: 39 % (ref 36.0–46.0)
Hemoglobin: 12.9 g/dL (ref 12.0–15.0)
Hemoglobin: 13.3 g/dL (ref 12.0–15.0)
O2 Saturation: 55 %
O2 Saturation: 55 %
Potassium: 4.2 mmol/L (ref 3.5–5.1)
Potassium: 4.3 mmol/L (ref 3.5–5.1)
Sodium: 130 mmol/L — ABNORMAL LOW (ref 135–145)
Sodium: 133 mmol/L — ABNORMAL LOW (ref 135–145)
TCO2: 30 mmol/L (ref 22–32)
TCO2: 32 mmol/L (ref 22–32)
pCO2, Ven: 52.9 mm[Hg] (ref 44–60)
pCO2, Ven: 54.9 mm[Hg] (ref 44–60)
pH, Ven: 7.332 (ref 7.25–7.43)
pH, Ven: 7.357 (ref 7.25–7.43)
pO2, Ven: 32 mm[Hg] (ref 32–45)
pO2, Ven: 31 mm[Hg] — CL (ref 32–45)

## 2023-08-09 LAB — BASIC METABOLIC PANEL
Anion gap: 12 (ref 5–15)
BUN: 20 mg/dL (ref 8–23)
CO2: 26 mmol/L (ref 22–32)
Calcium: 9.3 mg/dL (ref 8.9–10.3)
Chloride: 99 mmol/L (ref 98–111)
Creatinine, Ser: 1.13 mg/dL — ABNORMAL HIGH (ref 0.44–1.00)
GFR, Estimated: 54 mL/min — ABNORMAL LOW (ref >60)
Glucose, Bld: 116 mg/dL — ABNORMAL HIGH (ref 70–99)
Potassium: 4.8 mmol/L (ref 3.5–5.1)
Sodium: 137 mmol/L (ref 135–145)

## 2023-08-09 LAB — HEMOGLOBIN A1C
Hgb A1c MFr Bld: 6.7 % — ABNORMAL HIGH (ref 4.8–5.6)
Mean Plasma Glucose: 146 mg/dL

## 2023-08-09 LAB — MAGNESIUM: Magnesium: 1.7 mg/dL (ref 1.7–2.4)

## 2023-08-09 LAB — CBC
HCT: 40 % (ref 36.0–46.0)
Hemoglobin: 12.8 g/dL (ref 12.0–15.0)
MCH: 27.2 pg (ref 26.0–34.0)
MCHC: 32 g/dL (ref 30.0–36.0)
MCV: 84.9 fL (ref 80.0–100.0)
Platelets: 273 10*3/uL (ref 150–400)
RBC: 4.71 MIL/uL (ref 3.87–5.11)
RDW: 15.4 % (ref 11.5–15.5)
WBC: 7.4 10*3/uL (ref 4.0–10.5)
nRBC: 0 % (ref 0.0–0.2)

## 2023-08-09 LAB — APTT
aPTT: 61 s — ABNORMAL HIGH (ref 24–36)
aPTT: 53 s — ABNORMAL HIGH (ref 24–36)

## 2023-08-09 SURGERY — RIGHT/LEFT HEART CATH AND CORONARY ANGIOGRAPHY
Anesthesia: LOCAL

## 2023-08-09 MED ORDER — ALPRAZOLAM 0.5 MG PO TABS
0.5000 mg | ORAL_TABLET | Freq: Once | ORAL | Status: AC
  Administered 2023-08-09: 0.5 mg via ORAL
  Filled 2023-08-09: qty 1

## 2023-08-09 MED ORDER — IOHEXOL 350 MG/ML SOLN
INTRAVENOUS | Status: DC | PRN
  Administered 2023-08-09: 40 mL

## 2023-08-09 MED ORDER — CHLORHEXIDINE GLUCONATE CLOTH 2 % EX PADS
6.0000 | MEDICATED_PAD | Freq: Every day | CUTANEOUS | Status: DC
  Administered 2023-08-09 – 2023-08-12 (×4): 6 via TOPICAL

## 2023-08-09 MED ORDER — FUROSEMIDE 10 MG/ML IJ SOLN
12.0000 mg/h | INTRAVENOUS | Status: DC
  Administered 2023-08-09 – 2023-08-10 (×3): 12 mg/h via INTRAVENOUS
  Filled 2023-08-09 (×6): qty 20

## 2023-08-09 MED ORDER — HYDRALAZINE HCL 20 MG/ML IJ SOLN: 10.0000 mg | INTRAMUSCULAR | Status: AC | PRN

## 2023-08-09 MED ORDER — MIDAZOLAM HCL 2 MG/2ML IJ SOLN
INTRAMUSCULAR | Status: DC | PRN
  Administered 2023-08-09: .5 mg via INTRAVENOUS

## 2023-08-09 MED ORDER — SODIUM CHLORIDE 0.9% FLUSH
3.0000 mL | Freq: Two times a day (BID) | INTRAVENOUS | Status: DC
  Administered 2023-08-09: 3 mL via INTRAVENOUS

## 2023-08-09 MED ORDER — VERAPAMIL HCL 2.5 MG/ML IV SOLN
INTRAVENOUS | Status: AC
Start: 2023-08-09 — End: ?
  Filled 2023-08-09: qty 2

## 2023-08-09 MED ORDER — SODIUM CHLORIDE 0.9% FLUSH
10.0000 mL | Freq: Two times a day (BID) | INTRAVENOUS | Status: DC
  Administered 2023-08-09 – 2023-08-10 (×2): 10 mL
  Administered 2023-08-10: 40 mL
  Administered 2023-08-11 – 2023-08-12 (×3): 10 mL

## 2023-08-09 MED ORDER — LIDOCAINE HCL (PF) 1 % IJ SOLN
INTRAMUSCULAR | Status: DC | PRN
  Administered 2023-08-09 (×2): 2 mL via INTRADERMAL

## 2023-08-09 MED ORDER — HEPARIN SODIUM (PORCINE) 1000 UNIT/ML IJ SOLN
INTRAMUSCULAR | Status: AC
Start: 2023-08-09 — End: ?
  Filled 2023-08-09: qty 10

## 2023-08-09 MED ORDER — ACETAMINOPHEN 325 MG PO TABS: 650.0000 mg | ORAL_TABLET | ORAL | Status: DC | PRN

## 2023-08-09 MED ORDER — MIDAZOLAM HCL 2 MG/2ML IJ SOLN
INTRAMUSCULAR | Status: AC
Start: 2023-08-09 — End: ?
  Filled 2023-08-09: qty 2

## 2023-08-09 MED ORDER — MILRINONE LACTATE IN DEXTROSE 20-5 MG/100ML-% IV SOLN
0.1250 ug/kg/min | INTRAVENOUS | Status: DC
  Administered 2023-08-09 (×2): 0.25 ug/kg/min via INTRAVENOUS
  Administered 2023-08-10 – 2023-08-11 (×2): 0.125 ug/kg/min via INTRAVENOUS
  Filled 2023-08-09 (×4): qty 100

## 2023-08-09 MED ORDER — FUROSEMIDE 10 MG/ML IJ SOLN
80.0000 mg | Freq: Once | INTRAMUSCULAR | Status: AC
Start: 2023-08-09 — End: 2023-08-09
  Administered 2023-08-09: 80 mg via INTRAVENOUS
  Filled 2023-08-09: qty 8

## 2023-08-09 MED ORDER — HEPARIN SODIUM (PORCINE) 1000 UNIT/ML IJ SOLN
INTRAMUSCULAR | Status: DC | PRN
  Administered 2023-08-09: 5000 [IU] via INTRAVENOUS

## 2023-08-09 MED ORDER — SODIUM CHLORIDE 0.9% FLUSH: 3.0000 mL | INTRAVENOUS | Status: DC | PRN

## 2023-08-09 MED ORDER — MAGNESIUM SULFATE 2 GM/50ML IV SOLN: 2.0000 g | Freq: Once | INTRAVENOUS | Status: DC

## 2023-08-09 MED ORDER — SODIUM CHLORIDE 0.9 % IV SOLN: 250.0000 mL | INTRAVENOUS | Status: DC | PRN

## 2023-08-09 MED ORDER — SODIUM CHLORIDE 0.9% FLUSH: 10.0000 mL | INTRAVENOUS | Status: DC | PRN

## 2023-08-09 MED ORDER — LIDOCAINE HCL (PF) 1 % IJ SOLN
INTRAMUSCULAR | Status: AC
  Filled 2023-08-09: qty 30

## 2023-08-09 MED ORDER — LABETALOL HCL 5 MG/ML IV SOLN: 10.0000 mg | INTRAVENOUS | Status: AC | PRN

## 2023-08-09 MED ORDER — METHOCARBAMOL 500 MG PO TABS
500.0000 mg | ORAL_TABLET | Freq: Three times a day (TID) | ORAL | Status: DC
  Administered 2023-08-09 – 2023-08-12 (×10): 500 mg via ORAL
  Filled 2023-08-09 (×10): qty 1

## 2023-08-09 MED ORDER — FENTANYL CITRATE (PF) 100 MCG/2ML IJ SOLN
INTRAMUSCULAR | Status: DC | PRN
  Administered 2023-08-09: 25 ug via INTRAVENOUS

## 2023-08-09 MED ORDER — HEPARIN (PORCINE) IN NACL 1000-0.9 UT/500ML-% IV SOLN
INTRAVENOUS | Status: DC | PRN
  Administered 2023-08-09 (×2): 500 mL

## 2023-08-09 MED ORDER — ONDANSETRON HCL 4 MG/2ML IJ SOLN: 4.0000 mg | Freq: Four times a day (QID) | INTRAMUSCULAR | Status: DC | PRN

## 2023-08-09 MED ORDER — VERAPAMIL HCL 2.5 MG/ML IV SOLN
INTRAVENOUS | Status: DC | PRN
  Administered 2023-08-09: 10 mL via INTRA_ARTERIAL

## 2023-08-09 MED ORDER — HEPARIN (PORCINE) 25000 UT/250ML-% IV SOLN
1450.0000 [IU]/h | INTRAVENOUS | Status: DC
  Administered 2023-08-10 (×2): 1600 [IU]/h via INTRAVENOUS
  Filled 2023-08-09 (×2): qty 250

## 2023-08-09 MED ORDER — FENTANYL CITRATE (PF) 100 MCG/2ML IJ SOLN
INTRAMUSCULAR | Status: AC
  Filled 2023-08-09: qty 2

## 2023-08-09 SURGICAL SUPPLY — 11 items
CATH 5FR JL3.5 JR4 ANG PIG MP (CATHETERS) IMPLANT
CATH BALLN WEDGE 5F 110CM (CATHETERS) IMPLANT
DEVICE RAD COMP TR BAND LRG (VASCULAR PRODUCTS) IMPLANT
GLIDESHEATH SLEND SS 6F .021 (SHEATH) IMPLANT
GUIDEWIRE INQWIRE 1.5J.035X260 (WIRE) IMPLANT
INQWIRE 1.5J .035X260CM (WIRE) ×1
KIT SYRINGE INJ CVI SPIKEX1 (MISCELLANEOUS) IMPLANT
PACK CARDIAC CATHETERIZATION (CUSTOM PROCEDURE TRAY) ×2 IMPLANT
SET ATX-X65L (MISCELLANEOUS) IMPLANT
SHEATH GLIDE SLENDER 4/5FR (SHEATH) IMPLANT
SHEATH PROBE COVER 6X72 (BAG) IMPLANT

## 2023-08-09 NOTE — Anesthesia Preprocedure Evaluation (Addendum)
 Anesthesia Evaluation  Patient identified by MRN, date of birth, ID band Patient awake    Reviewed: Allergy & Precautions, NPO status , Patient's Chart, lab work & pertinent test results  History of Anesthesia Complications Negative for: history of anesthetic complications  Airway Mallampati: II  TM Distance: >3 FB Neck ROM: Full    Dental  (+) Edentulous Lower, Edentulous Upper   Pulmonary COPD, Current Smoker   Pulmonary exam normal        Cardiovascular hypertension, Pt. on medications and Pt. on home beta blockers + CAD, + Peripheral Vascular Disease and +CHF  Normal cardiovascular exam+ dysrhythmias (on Xarelto ) Atrial Fibrillation   TTE 05/2023: EF 30-35%, global hypokinesis, moderate LVE, severe LAE, small pericardial effusion, moderate to severe MR, moderate TR, mild AR  Cath 08/09/23:   Prox RCA lesion is 60% stenosed.   Mid LAD lesion is 20% stenosed.   2nd Diag lesion is 80% stenosed.   Mid Cx lesion is 75% stenosed.   1. Elevated right and left heart filling pressures.  2. Mixed pulmonary venous/pulmonary arterial hypertension.  3. Low PAPi 4. Low CI, 1.7.  5. Patent LAD stent.  75% mid LCx stenosis and heavily calcified 60% proximal RCA stenosis.     Manage CAD medically given no critical lesion and no chest pain.  Will need ongoing aggressive diuresis and will use milrinone  for now with low cardiac output.       Neuro/Psych   Anxiety Depression    Parkinson's dx    GI/Hepatic ,GERD  Medicated,,(+)     substance abuse  alcohol use, cocaine use, marijuana use and methamphetamine use  Endo/Other  negative endocrine ROS    Renal/GU negative Renal ROS  negative genitourinary   Musculoskeletal  (+) Arthritis ,  Fibromyalgia -  Abdominal   Peds  Hematology negative hematology ROS (+)   Anesthesia Other Findings Day of surgery medications reviewed with patient.   Reproductive/Obstetrics negative OB ROS                              Anesthesia Physical Anesthesia Plan  ASA: 4  Anesthesia Plan: MAC   Post-op Pain Management: Minimal or no pain anticipated   Induction:   PONV Risk Score and Plan: 2 and Treatment may vary due to age or medical condition and Propofol  infusion  Airway Management Planned: Natural Airway and Nasal Cannula  Additional Equipment:   Intra-op Plan:   Post-operative Plan:   Informed Consent: I have reviewed the patients History and Physical, chart, labs and discussed the procedure including the risks, benefits and alternatives for the proposed anesthesia with the patient or authorized representative who has indicated his/her understanding and acceptance.       Plan Discussed with: CRNA  Anesthesia Plan Comments:         Anesthesia Quick Evaluation

## 2023-08-09 NOTE — Progress Notes (Signed)
ANTICOAGULATION CONSULT NOTE   Pharmacy Consult for Heparin Indication: atrial fibrillation  Allergies  Allergen Reactions   Pamelor [Nortriptyline Hcl] Anaphylaxis   Vibra-Tab [Doxycycline] Other (See Comments)    Unknown reaction    Patient Measurements: Height: 5\' 4"  (162.6 cm) Weight: 96.8 kg (213 lb 6.4 oz) IBW/kg (Calculated) : 54.7 Heparin Dosing Weight: 77.4 kg  Vital Signs: Temp: 97.9 F (36.6 C) (12/30 0353) Temp Source: Oral (12/30 0353) BP: 107/76 (12/30 0935) Pulse Rate: 79 (12/30 0935)  Labs: Recent Labs    08/06/23 1218 08/06/23 1802 08/07/23 0850 08/08/23 0735 08/08/23 0948 08/08/23 1548 08/08/23 2202 08/09/23 0405  HGB 13.2  --   --  12.9  --   --   --  12.8  HCT 40.4  --   --  40.3  --   --   --  40.0  PLT 287  --   --  294  --   --   --  273  APTT  --    < > 88* 122*  --  69* 67* 61*  HEPARINUNFRC  --    < > 0.99* 0.86*  --   --   --  0.49  CREATININE 1.13*  --  0.95  --  1.19*  --   --  1.13*   < > = values in this interval not displayed.    Estimated Creatinine Clearance: 55.3 mL/min (A) (by C-G formula based on SCr of 1.13 mg/dL (H)).   Medical History: Past Medical History:  Diagnosis Date   Anxiety    Arthritis    Attention deficit disorder (ADD)    COPD (chronic obstructive pulmonary disease) (HCC)    Depression    Drug abuse (HCC)    Fibromyalgia    HFrEF (heart failure with reduced ejection fraction) (HCC)    Hyperlipidemia    Paroxysmal Atrial fibrillation     Medications:  Medications Prior to Admission  Medication Sig Dispense Refill Last Dose/Taking   albuterol (VENTOLIN HFA) 108 (90 Base) MCG/ACT inhaler Inhale 2 puffs into the lungs every 4 (four) hours as needed for wheezing.   Past Week   ARIPiprazole (ABILIFY) 5 MG tablet Take 1 tablet (5 mg total) by mouth daily. 30 tablet 0 Past Week   aspirin (ASPIRIN CHILDRENS) 81 MG chewable tablet Chew 1 tablet (81 mg total) by mouth daily. 30 tablet 11 Past Week    atorvastatin (LIPITOR) 80 MG tablet Take 1 tablet (80 mg total) by mouth daily. 30 tablet 0 Past Week   Budeson-Glycopyrrol-Formoterol (BREZTRI AEROSPHERE) 160-9-4.8 MCG/ACT AERO Inhale 2 puffs into the lungs in the morning and at bedtime. 10.7 g 0 Past Week   dapagliflozin propanediol (FARXIGA) 10 MG TABS tablet Take 1 tablet (10 mg total) by mouth daily. 30 tablet 0 Past Week   digoxin (LANOXIN) 0.125 MG tablet Take 1 tablet (0.125 mg total) by mouth daily. 30 tablet 3 Past Week   fluticasone (FLONASE) 50 MCG/ACT nasal spray Place 2 sprays into both nostrils daily as needed for allergies.   Past Week   gabapentin (NEURONTIN) 300 MG capsule Take 2 capsules (600 mg total) by mouth 3 (three) times daily as needed (Takes 2-3 as needed). (Patient taking differently: Take 600 mg by mouth 3 (three) times daily as needed (for pain).) 90 capsule 0 Past Week   metoprolol succinate (TOPROL-XL) 100 MG 24 hr tablet Take 1 tablet (100 mg total) by mouth daily. Take with or immediately following a meal. 30 tablet 0  Past Week   naloxone (NARCAN) nasal spray 4 mg/0.1 mL Place 4 mg into the nose as needed (overdose, as needed).   Past Month   pantoprazole (PROTONIX) 40 MG tablet Take 1 tablet (40 mg total) by mouth daily. 30 tablet 0 Past Week   rivaroxaban (XARELTO) 20 MG TABS tablet Take 1 tablet (20 mg total) by mouth daily with supper. 30 tablet 0 Past Week   spironolactone (ALDACTONE) 25 MG tablet Take 0.5 tablets (12.5 mg total) by mouth daily. 45 tablet 3 Past Week   torsemide (DEMADEX) 20 MG tablet TAKE 60MG  (3 TABLETS) IN THE MORNING AND 40MG  (2 TABLETS) IN THE EVENING 180 tablet 1 Past Week   amiodarone (PACERONE) 200 MG tablet Take 2 tablets (400 mg total) by mouth 2 (two) times daily. (Patient not taking: Reported on 08/06/2023) 120 tablet 0 Not Taking   Budeson-Glycopyrrol-Formoterol (BREZTRI AEROSPHERE) 160-9-4.8 MCG/ACT AERO Inhale 2 puffs into the lungs 2 (two) times daily. (Patient not taking: Reported  on 08/06/2023)   Not Taking   DULoxetine (CYMBALTA) 30 MG capsule Take 1 capsule (30 mg total) by mouth daily. (Patient not taking: Reported on 08/06/2023) 30 capsule 0 Not Taking   nicotine (NICODERM CQ - DOSED IN MG/24 HOURS) 14 mg/24hr patch Place 1 patch (14 mg total) onto the skin daily. (Patient not taking: Reported on 08/06/2023) 28 patch 0 Not Taking   Scheduled:   acetaminophen  650 mg Oral Q6H   ARIPiprazole  5 mg Oral Daily   aspirin  81 mg Oral Daily   atorvastatin  80 mg Oral Daily   dapagliflozin propanediol  10 mg Oral Daily   digoxin  0.125 mg Oral Daily   feeding supplement  237 mL Oral BID BM   fluticasone furoate-vilanterol  1 puff Inhalation Daily   And   umeclidinium bromide  1 puff Inhalation Daily   furosemide  80 mg Intravenous Once   metoprolol succinate  100 mg Oral Daily   pantoprazole  40 mg Oral Daily   spironolactone  25 mg Oral Daily   Infusions:   amiodarone 30 mg/hr (08/09/23 0258)   furosemide (LASIX) 200 mg in dextrose 5 % 100 mL (2 mg/mL) infusion     heparin 1,200 Units/hr (08/09/23 0545)   magnesium sulfate bolus IVPB     milrinone     PRN: fluticasone, gabapentin, levalbuterol, nicotine, ondansetron **OR** ondansetron (ZOFRAN) IV, oxyCODONE-acetaminophen, senna-docusate  Assessment: 14 yof with a history of HF, CAD s/p prior DES to LAD, AF, mitral regurgitation, COPD. Patient is presenting with left-sided sciatic pain and AF. Heparin per pharmacy consult placed for atrial fibrillation. Patient is on Xarelto prior to arrival. Last dose is unknown and noncompliant as outpatient.  Heparin stopped for [facility] today - to resume 2h after radial band removed. aPTT was subtherapeutic this am on 1200 units/h. TR band about to be removed ~1215 per RN.  Goal of Therapy:  Heparin level 0.3-0.7 units/ml aPTT 66-102 seconds Monitor platelets by anticoagulation protocol: Yes   Plan:  Resume heparin 1400 units/h no bolus at 1430 Check aPTT in  6h  Fredonia Highland, PharmD, [place], [facility] Clinical Pharmacist [phone] Please check AMION for all [facility] Pharmacy numbers 08/09/2023

## 2023-08-09 NOTE — Plan of Care (Signed)
  Problem: Education: Goal: Understanding of CV disease, CV risk reduction, and recovery process will improve Outcome: Progressing Goal: Individualized Educational Video(s) Outcome: Progressing   Problem: Activity: Goal: Ability to return to baseline activity level will improve Outcome: Progressing   Problem: Cardiovascular: Goal: Ability to achieve and maintain adequate cardiovascular perfusion will improve Outcome: Progressing Goal: Vascular access site(s) Level 0-1 will be maintained Outcome: Progressing   Problem: Health Behavior/Discharge Planning: Goal: Ability to safely manage health-related needs after discharge will improve Outcome: Progressing   Problem: Education: Goal: Understanding of CV disease, CV risk reduction, and recovery process will improve Outcome: Progressing

## 2023-08-09 NOTE — Progress Notes (Signed)
Patient ID: Autumn [name], female   DOB: [birth year], 67 y.o.   MRN: [id number]     Advanced Heart Failure Rounding Note  Cardiologist: Thomasene Ripple, DO   Subjective:   Chief Complaint: Shortness of breath  I/Os net negative 1772 with weight down.  Still with dyspnea.  She remains in atrial fibrillation, rate-controlled on amiodarone gtt.   RHC/LHC today: Coronary Findings  Diagnostic Dominance: Right Left Anterior Descending  Mid LAD lesion is 20% stenosed. The lesion was previously treated .    Second Diagonal Branch  2nd Diag lesion is 80% stenosed.    Left Circumflex  Mid Cx lesion is 75% stenosed.    Right Coronary Artery  Prox RCA lesion is 60% stenosed. The lesion is calcified.    Intervention   No interventions have been documented.   Right Heart  Right Heart Pressures RHC Procedural Findings: Hemodynamics (mmHg) RA mean 18 RV 52/14 PA 57/32 mean 43 PCWP mean 28 LV 141/28 AO 139/86  Oxygen saturations: PA 55% AO 100%  Cardiac Output (Fick) 3.41  Cardiac Index (Fick) 1.7 PVR 4.4 WU  PAPi 1.4     Objective:   Weight Range: 96.8 kg Body mass index is 36.63 kg/m.   Vital Signs:   Temp:  [97.8 F (36.6 C)-98.2 F (36.8 C)] 97.9 F (36.6 C) (12/30 0353) Pulse Rate:  [69-79] 79 (12/30 0353) Resp:  [14-21] 20 (12/30 0642) BP: (96-138)/(63-119) 96/72 (12/30 0353) SpO2:  [92 %-98 %] 98 % (12/30 0756) Weight:  [96.8 kg] 96.8 kg (12/30 0353) Last BM Date : 08/08/23  Weight change: Filed Weights   08/07/23 1528 08/08/23 0349 08/09/23 0353  Weight: 98.9 kg 97.5 kg 96.8 kg    Intake/Output:   Intake/Output Summary (Last 24 hours) at 08/09/2023 0920 Last data filed at 08/09/2023 0658 Gross per 24 hour  Intake 2027.8 ml  Output 3100 ml  Net -1072.2 ml      Physical Exam    General: NAD Neck: JVP 16 cm, no thyromegaly or thyroid nodule.  Lungs: Clear to auscultation bilaterally with normal respiratory effort. CV: Nondisplaced PMI.   Heart irregular S1/S2, no S3/S4, no murmur.  1+ ankle edema.    Abdomen: Soft, nontender, no hepatosplenomegaly, no distention.  Skin: Intact without lesions or rashes.  Neurologic: Alert and oriented x 3.  Psych: Normal affect. Extremities: No clubbing or cyanosis.  HEENT: Normal.   Telemetry   Atrial fibrillation rate 80s (personally reviewed)  Labs    CBC Recent Labs    08/08/23 0735 08/09/23 0405  WBC 6.3 7.4  HGB 12.9 12.8  HCT 40.3 40.0  MCV 86.3 84.9  PLT 294 273   Basic Metabolic Panel Recent Labs    87/56/43 0543 08/07/23 0850 08/08/23 0948 08/09/23 0405  NA  --    < > 135 137  K  --    < > 4.2 4.8  CL  --    < > 98 99  CO2  --    < > 26 26  GLUCOSE  --    < > 138* 116*  BUN  --    < > 17 20  CREATININE  --    < > 1.19* 1.13*  CALCIUM  --    < > 9.0 9.3  MG 2.0  --   --  1.7  PHOS 4.9*  --   --   --    < > = values in this interval not displayed.   Liver Function  Tests No results for input(s): "AST", "ALT", "ALKPHOS", "BILITOT", "PROT", "ALBUMIN" in the last 72 hours. No results for input(s): "LIPASE", "AMYLASE" in the last 72 hours. Cardiac Enzymes No results for input(s): "CKTOTAL", "CKMB", "CKMBINDEX", "TROPONINI" in the last 72 hours.  BNP: BNP (last 3 results) Recent Labs    05/31/23 0712 07/02/23 1153 08/06/23 1802  BNP 606.9* 411.5* 238.2*    ProBNP (last 3 results) No results for input(s): "PROBNP" in the last 8760 hours.   D-Dimer No results for input(s): "DDIMER" in the last 72 hours. Hemoglobin A1C No results for input(s): "HGBA1C" in the last 72 hours. Fasting Lipid Panel No results for input(s): "CHOL", "HDL", "LDLCALC", "TRIG", "CHOLHDL", "LDLDIRECT" in the last 72 hours. Thyroid Function Tests Recent Labs    08/07/23 0543  TSH 2.242    Other results:   Imaging    CARDIAC CATHETERIZATION Result Date: 08/09/2023   Prox RCA lesion is 60% stenosed.   Mid LAD lesion is 20% stenosed.   2nd Diag lesion is 80%  stenosed.   Mid Cx lesion is 75% stenosed. 1. Elevated right and left heart filling pressures. 2. Mixed pulmonary venous/pulmonary arterial hypertension. 3. Low PAPi 4. Low CI, 1.7. 5. Patent LAD stent.  75% mid LCx stenosis and heavily calcified 60% proximal RCA stenosis.  Manage CAD medically given no critical lesion and no chest pain.  Will need ongoing aggressive diuresis and will use milrinone for now with low cardiac output.   [place] EKG SITE RITE Result Date: 08/09/2023 If [facility] image not attached, placement could not be confirmed due to current cardiac rhythm.     Medications:     Scheduled Medications:  [MAR Hold] acetaminophen  650 mg Oral Q6H   [MAR Hold] ARIPiprazole  5 mg Oral Daily   [MAR Hold] aspirin  81 mg Oral Daily   [MAR Hold] atorvastatin  80 mg Oral Daily   [MAR Hold] dapagliflozin propanediol  10 mg Oral Daily   [MAR Hold] digoxin  0.125 mg Oral Daily   [MAR Hold] feeding supplement  237 mL Oral BID BM   [MAR Hold] fluticasone furoate-vilanterol  1 puff Inhalation Daily   And   [MAR Hold] umeclidinium bromide  1 puff Inhalation Daily   furosemide  80 mg Intravenous Once   [MAR Hold] metoprolol succinate  100 mg Oral Daily   [MAR Hold] pantoprazole  40 mg Oral Daily   [MAR Hold] spironolactone  25 mg Oral Daily    Infusions:  sodium chloride     amiodarone 30 mg/hr (08/09/23 0258)   furosemide (LASIX) 200 mg in dextrose 5 % 100 mL (2 mg/mL) infusion     heparin 1,200 Units/hr (08/09/23 0545)   [MAR Hold] magnesium sulfate bolus IVPB     milrinone      PRN Medications: [MAR Hold] fluticasone, [MAR Hold] gabapentin, [MAR Hold] levalbuterol, [MAR Hold] nicotine, [MAR Hold] ondansetron **OR** [MAR Hold] ondansetron (ZOFRAN) IV, [MAR Hold] oxyCODONE-acetaminophen, [MAR Hold] senna-docusate   Assessment/Plan   1. Acute on chronic systolic CHF: Echo in 6/22 with EF 60-65%.  Has had multiple echoes in 2024, each with lower EF.  Most recent echo in 10/24 with  EF 30-35%, moderate-severe MR.  She has a history of CAD but denies recent chest pain.  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  Cath today showed significant CAD but no critical lesion.  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  She was admitted with volume overload and  dyspnea.  RHC today with elevated right and left heart filling pressures, low PAPi and low CI at 1.7.  - Place PICC, follow CVP and co-ox.   - With low CO, will start milrinone 0.25 while diuresing.    - Lasix 80 mg IV x 1 then Lasix gtt 12 mg/hr.   - Continue Farxiga 10 daily - Continue spironolactone 25 daily - Continue digoxin 0.125 daily - Continue Toprol XL 100 daily for now for rate control, may need to decrease.  - Can add Entresto if BP remains stable.  - Will need TEE-guided DCCV after she is fully diuresed, maybe tomorrow.   2. Atrial fibrillation: Persistent, multiple admissions with AF/RVR.  Says she has never been cardioverted.  On Xarelto at home, heparin gtt currently.  - Continue amiodarone gtt 30 mg/hr.  - Continue Toprol XL and digoxin.  - Continue heparin gtt.  - She will need TEE-guided DCCV after diuresis.  - Would favor eventual AF ablation.  3. 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  - Would reassess when in NSR.  4. CAD: DES to LAD in 9/20.  No recent chest pain. LHC today showed patent LAD stent. 75% mid LCx stenosis and heavily calcified 60% proximal RCA stenosis.  - Manage CAD medically given no critical lesion and no chest pain.  - Continue ASA 81 and atorvastatin.  5. COPD: She smokes and uses home oxygen at times.  - Needs to quit smoking.  6. Sciatica: Significant pain.  Per [facility].  7. OSA: Suspected.  - Will need sleep study.   Marca Ancona 08/09/2023 9:20 AM   Length of Stay: 3  Marca Ancona, MD  08/09/2023, 9:20 AM  Advanced Heart Failure Team Pager [phone] (M-F; 7a - 5p)   Please contact [facility] for night-coverage after hours (5p -7a ) and weekends on [email or web address]

## 2023-08-09 NOTE — Progress Notes (Addendum)
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 She was found to be volume overloaded, so IV lasix, amiodarone and heparin infusions were started, and the patient admitted with cardiology consultation. XR demonstrates progressive degenerative disc disease involving L3-L4-L5 levels.   -RHC/LHC 12/30 with moderate to severe nonobstructive CAD, no critical disease, elevated filling pressures, wedge 28, cardiac index 1.7  Subjective: Feels okay, just back from cardiac cath  Assessment and Plan:  A-fib with RVR: - History of persistent A-fib with with previous cardioversion unsuccessful.  - plan for DCCV - Continue amiodarone gtt.and heparin gtt. - Continue metoprolol succinate,  digoxin, -Magnesium repleted   Acute on chronic systolic CHF  -TTE in 05/2023 showed EF 30-35%, global HK, severe LAE, mod-severe MR. -CHF team following, on IV Lasix, Farxiga, Aldactone, Toprol, digoxin -cath with significant CAD but no critical lesion, RHC with elevated filling pressures, low cardiac index -Starting milrinone today Increase activity   Advanced lumbar DDD, widespread OA, fibromyalgia: Known to have severe arthritis, has been seen by orthopedics in [facility] system previously.  - Continue scheduled gabapentin and tylenol as well as prn oxycodone.  -Add Robaxin, continue PT OT - Will need orthopedic/spine surgery follow up for this chronic progressive condition   Hypokalemia:  -Supplemented, improved  New diabetes mellitus, hyperglycemia -HbA1c is 6.7 consistent with new diabetes, continue [place]  CAD: s/p DES to LAD in 2020.  - Continue ASA, BB, statin.  -Plan for right and left heart cath  tomorrow   Chronic hypoxic respiratory failure, COPD: - Quiescent - Continue home 2L O2.  - Continue breztri and prn xopenex -Looking cessation counseled   OSA: Not on CPAP therapy. -Outpatient follow-up with pulmonology   Anxiety, depression: Not on home medications.  - Continue aripiprazole, consider prn   Morbid obesity: Body mass index is 37.24 kg/m.    Tobacco use:  - Nicotine patch prn   GERD:  - PPI   DVT prophylaxis: IV heparin Code Status: Full code Family Communication: None present Disposition Plan: Home likely 48 hours  Consultants:    Procedures:   Antimicrobials:    Objective: Vitals:   08/09/23 0938 08/09/23 0944 08/09/23 1100 08/09/23 1125  BP:      Pulse: (!) 0 (!) 0 72 (!) 0  Resp:      Temp:      TempSrc:      SpO2:      Weight:      Height:        Intake/Output Summary (Last 24 hours) at 08/09/2023 1257 Last data filed at 08/09/2023 1142 Gross per 24 hour  Intake 1667.8 ml  Output 2900 ml  Net -1232.2 ml   Filed Weights   08/07/23 1528 08/08/23 0349 08/09/23 0353  Weight: 98.9 kg 97.5 kg 96.8 kg    Examination:  General exam: Obese chronically ill female sitting up in bed, AAOx3 HEENT: Positive JVD CVS: S1-S2, irregular rhythm Lungs: Few rhonchi Abdomen: Soft, obese, nontender, bowel sounds present Extremities: Trace edema Skin: No rashes Psychiatry:  Mood & affect appropriate.     Data Reviewed:   CBC: Recent Labs  Lab 08/06/23 1218 08/08/23 0735 08/09/23 0405  WBC 7.3 6.3 7.4  HGB 13.2 12.9 12.8  HCT 40.4 40.3 40.0  MCV 83.3 86.3 84.9  PLT 287 294 273   Basic Metabolic Panel: Recent Labs  Lab 08/06/23 1218 08/07/23 0543 08/07/23 0850 08/08/23 0948 08/09/23 0405  NA 133*  --  133* 135 137  K 2.8*  --  2.9* 4.2 4.8  CL 94*  --  94* 98 99  CO2 27  --  27 26 26   GLUCOSE 102*  --  256* 138* 116*  BUN 14  --  12 17 20   CREATININE 1.13*  --  0.95 1.19* 1.13*  CALCIUM 9.1  --  9.0 9.0 9.3  MG  --   2.0  --   --  1.7  PHOS  --  4.9*  --   --   --    GFR: Estimated Creatinine Clearance: 55.3 mL/min (A) (by C-G formula based on SCr of 1.13 mg/dL (H)). Liver Function Tests: No results for input(s): "AST", "ALT", "ALKPHOS", "BILITOT", "PROT", "ALBUMIN" in the last 168 hours. No results for input(s): "LIPASE", "AMYLASE" in the last 168 hours. No results for input(s): "AMMONIA" in the last 168 hours. Coagulation Profile: No results for input(s): "INR", "PROTIME" in the last 168 hours. Cardiac Enzymes: No results for input(s): "CKTOTAL", "CKMB", "CKMBINDEX", "TROPONINI" in the last 168 hours. BNP (last 3 results) No results for input(s): "PROBNP" in the last 8760 hours. HbA1C: Recent Labs    08/08/23 0735  HGBA1C 6.7*   CBG: No results for input(s): "GLUCAP" in the last 168 hours. Lipid Profile: No results for input(s): "CHOL", "HDL", "LDLCALC", "TRIG", "CHOLHDL", "LDLDIRECT" in the last 72 hours. Thyroid Function Tests: Recent Labs    08/07/23 0543  TSH 2.242   Anemia Panel: No results for input(s): "VITAMINB12", "FOLATE", "FERRITIN", "TIBC", "IRON", "RETICCTPCT" in the last 72 hours. Urine analysis:    Component Value Date/Time   COLORURINE STRAW (A) 01/13/2021 1234   APPEARANCEUR CLEAR 01/13/2021 1234   LABSPEC 1.003 (L) 01/13/2021 1234   PHURINE 5.0 01/13/2021 1234   GLUCOSEU NEGATIVE 01/13/2021 1234   HGBUR NEGATIVE 01/13/2021 1234   BILIRUBINUR NEGATIVE 01/13/2021 1234   KETONESUR NEGATIVE 01/13/2021 1234   PROTEINUR NEGATIVE 01/13/2021 1234   UROBILINOGEN 0.2 12/06/2012 1020   NITRITE NEGATIVE 01/13/2021 1234   LEUKOCYTESUR NEGATIVE 01/13/2021 1234   Sepsis Labs: @LABRCNTIP (procalcitonin:4,lacticidven:4)  )No results found for this or any previous visit (from the past 240 hours).   Radiology Studies: CARDIAC CATHETERIZATION Result Date: 08/09/2023   Prox RCA lesion is 60% stenosed.   Mid LAD lesion is 20% stenosed.   2nd Diag lesion is 80% stenosed.   Mid  Cx lesion is 75% stenosed. 1. Elevated right and left heart filling pressures. 2. Mixed pulmonary venous/pulmonary arterial hypertension. 3. Low PAPi 4. Low CI, 1.7. 5. Patent LAD stent.  75% mid LCx stenosis and heavily calcified 60% proximal RCA stenosis.  Manage CAD medically given no critical lesion and no chest pain.  Will need ongoing aggressive diuresis and will use milrinone for now with low cardiac output.   [place] EKG SITE RITE Result Date: 08/09/2023 If [facility] image not attached, placement could not be confirmed due to current cardiac rhythm.    Scheduled Meds:  acetaminophen  650 mg Oral Q6H   ARIPiprazole  5 mg Oral Daily   aspirin  81 mg Oral Daily   atorvastatin  80 mg Oral Daily   dapagliflozin propanediol  10 mg Oral Daily   digoxin  0.125 mg Oral Daily   feeding supplement  237  mL Oral BID BM   fluticasone furoate-vilanterol  1 puff Inhalation Daily   And   umeclidinium bromide  1 puff Inhalation Daily   metoprolol succinate  100 mg Oral Daily   pantoprazole  40 mg Oral Daily   spironolactone  25 mg Oral Daily   Continuous Infusions:  amiodarone 30 mg/hr (08/09/23 0258)   furosemide (LASIX) 200 mg in dextrose 5 % 100 mL (2 mg/mL) infusion 12 mg/hr (08/09/23 1152)   heparin     magnesium sulfate bolus IVPB     milrinone 0.25 mcg/kg/min (08/09/23 1122)     LOS: 3 days    Time spent:    Zannie Cove, MD [facility]   08/09/2023, 12:57 PM

## 2023-08-09 NOTE — H&P (View-Only) (Signed)
Patient ID: Autumn [name], female   DOB: [birth year], 67 y.o.   MRN: [id number]     Advanced Heart Failure Rounding Note  Cardiologist: Thomasene Ripple, DO   Subjective:   Chief Complaint: Shortness of breath  I/Os net negative 1772 with weight down.  Still with dyspnea.  She remains in atrial fibrillation, rate-controlled on amiodarone gtt.   RHC/LHC today: Coronary Findings  Diagnostic Dominance: Right Left Anterior Descending  Mid LAD lesion is 20% stenosed. The lesion was previously treated .    Second Diagonal Branch  2nd Diag lesion is 80% stenosed.    Left Circumflex  Mid Cx lesion is 75% stenosed.    Right Coronary Artery  Prox RCA lesion is 60% stenosed. The lesion is calcified.    Intervention   No interventions have been documented.   Right Heart  Right Heart Pressures RHC Procedural Findings: Hemodynamics (mmHg) RA mean 18 RV 52/14 PA 57/32 mean 43 PCWP mean 28 LV 141/28 AO 139/86  Oxygen saturations: PA 55% AO 100%  Cardiac Output (Fick) 3.41  Cardiac Index (Fick) 1.7 PVR 4.4 WU  PAPi 1.4     Objective:   Weight Range: 96.8 kg Body mass index is 36.63 kg/m.   Vital Signs:   Temp:  [97.8 F (36.6 C)-98.2 F (36.8 C)] 97.9 F (36.6 C) (12/30 0353) Pulse Rate:  [69-79] 79 (12/30 0353) Resp:  [14-21] 20 (12/30 0642) BP: (96-138)/(63-119) 96/72 (12/30 0353) SpO2:  [92 %-98 %] 98 % (12/30 0756) Weight:  [96.8 kg] 96.8 kg (12/30 0353) Last BM Date : 08/08/23  Weight change: Filed Weights   08/07/23 1528 08/08/23 0349 08/09/23 0353  Weight: 98.9 kg 97.5 kg 96.8 kg    Intake/Output:   Intake/Output Summary (Last 24 hours) at 08/09/2023 0920 Last data filed at 08/09/2023 0658 Gross per 24 hour  Intake 2027.8 ml  Output 3100 ml  Net -1072.2 ml      Physical Exam    General: NAD Neck: JVP 16 cm, no thyromegaly or thyroid nodule.  Lungs: Clear to auscultation bilaterally with normal respiratory effort. CV: Nondisplaced PMI.   Heart irregular S1/S2, no S3/S4, no murmur.  1+ ankle edema.    Abdomen: Soft, nontender, no hepatosplenomegaly, no distention.  Skin: Intact without lesions or rashes.  Neurologic: Alert and oriented x 3.  Psych: Normal affect. Extremities: No clubbing or cyanosis.  HEENT: Normal.   Telemetry   Atrial fibrillation rate 80s (personally reviewed)  Labs    CBC Recent Labs    08/08/23 0735 08/09/23 0405  WBC 6.3 7.4  HGB 12.9 12.8  HCT 40.3 40.0  MCV 86.3 84.9  PLT 294 273   Basic Metabolic Panel Recent Labs    87/56/43 0543 08/07/23 0850 08/08/23 0948 08/09/23 0405  NA  --    < > 135 137  K  --    < > 4.2 4.8  CL  --    < > 98 99  CO2  --    < > 26 26  GLUCOSE  --    < > 138* 116*  BUN  --    < > 17 20  CREATININE  --    < > 1.19* 1.13*  CALCIUM  --    < > 9.0 9.3  MG 2.0  --   --  1.7  PHOS 4.9*  --   --   --    < > = values in this interval not displayed.   Liver Function  Tests No results for input(s): "AST", "ALT", "ALKPHOS", "BILITOT", "PROT", "ALBUMIN" in the last 72 hours. No results for input(s): "LIPASE", "AMYLASE" in the last 72 hours. Cardiac Enzymes No results for input(s): "CKTOTAL", "CKMB", "CKMBINDEX", "TROPONINI" in the last 72 hours.  BNP: BNP (last 3 results) Recent Labs    05/31/23 0712 07/02/23 1153 08/06/23 1802  BNP 606.9* 411.5* 238.2*    ProBNP (last 3 results) No results for input(s): "PROBNP" in the last 8760 hours.   D-Dimer No results for input(s): "DDIMER" in the last 72 hours. Hemoglobin A1C No results for input(s): "HGBA1C" in the last 72 hours. Fasting Lipid Panel No results for input(s): "CHOL", "HDL", "LDLCALC", "TRIG", "CHOLHDL", "LDLDIRECT" in the last 72 hours. Thyroid Function Tests Recent Labs    08/07/23 0543  TSH 2.242    Other results:   Imaging    CARDIAC CATHETERIZATION Result Date: 08/09/2023   Prox RCA lesion is 60% stenosed.   Mid LAD lesion is 20% stenosed.   2nd Diag lesion is 80%  stenosed.   Mid Cx lesion is 75% stenosed. 1. Elevated right and left heart filling pressures. 2. Mixed pulmonary venous/pulmonary arterial hypertension. 3. Low PAPi 4. Low CI, 1.7. 5. Patent LAD stent.  75% mid LCx stenosis and heavily calcified 60% proximal RCA stenosis.  Manage CAD medically given no critical lesion and no chest pain.  Will need ongoing aggressive diuresis and will use milrinone for now with low cardiac output.   [place] EKG SITE RITE Result Date: 08/09/2023 If [facility] image not attached, placement could not be confirmed due to current cardiac rhythm.     Medications:     Scheduled Medications:  [MAR Hold] acetaminophen  650 mg Oral Q6H   [MAR Hold] ARIPiprazole  5 mg Oral Daily   [MAR Hold] aspirin  81 mg Oral Daily   [MAR Hold] atorvastatin  80 mg Oral Daily   [MAR Hold] dapagliflozin propanediol  10 mg Oral Daily   [MAR Hold] digoxin  0.125 mg Oral Daily   [MAR Hold] feeding supplement  237 mL Oral BID BM   [MAR Hold] fluticasone furoate-vilanterol  1 puff Inhalation Daily   And   [MAR Hold] umeclidinium bromide  1 puff Inhalation Daily   furosemide  80 mg Intravenous Once   [MAR Hold] metoprolol succinate  100 mg Oral Daily   [MAR Hold] pantoprazole  40 mg Oral Daily   [MAR Hold] spironolactone  25 mg Oral Daily    Infusions:  sodium chloride     amiodarone 30 mg/hr (08/09/23 0258)   furosemide (LASIX) 200 mg in dextrose 5 % 100 mL (2 mg/mL) infusion     heparin 1,200 Units/hr (08/09/23 0545)   [MAR Hold] magnesium sulfate bolus IVPB     milrinone      PRN Medications: [MAR Hold] fluticasone, [MAR Hold] gabapentin, [MAR Hold] levalbuterol, [MAR Hold] nicotine, [MAR Hold] ondansetron **OR** [MAR Hold] ondansetron (ZOFRAN) IV, [MAR Hold] oxyCODONE-acetaminophen, [MAR Hold] senna-docusate   Assessment/Plan   1. Acute on chronic systolic CHF: Echo in 6/22 with EF 60-65%.  Has had multiple echoes in 2024, each with lower EF.  Most recent echo in 10/24 with  EF 30-35%, moderate-severe MR.  She has a history of CAD but denies recent chest pain.  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  Cath today showed significant CAD but no critical lesion.  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  She was admitted with volume overload and  dyspnea.  RHC today with elevated right and left heart filling pressures, low PAPi and low CI at 1.7.  - Place PICC, follow CVP and co-ox.   - With low CO, will start milrinone 0.25 while diuresing.    - Lasix 80 mg IV x 1 then Lasix gtt 12 mg/hr.   - Continue Farxiga 10 daily - Continue spironolactone 25 daily - Continue digoxin 0.125 daily - Continue Toprol XL 100 daily for now for rate control, may need to decrease.  - Can add Entresto if BP remains stable.  - Will need TEE-guided DCCV after she is fully diuresed, maybe tomorrow.   2. Atrial fibrillation: Persistent, multiple admissions with AF/RVR.  Says she has never been cardioverted.  On Xarelto at home, heparin gtt currently.  - Continue amiodarone gtt 30 mg/hr.  - Continue Toprol XL and digoxin.  - Continue heparin gtt.  - She will need TEE-guided DCCV after diuresis.  - Would favor eventual AF ablation.  3. 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  - Would reassess when in NSR.  4. CAD: DES to LAD in 9/20.  No recent chest pain. LHC today showed patent LAD stent. 75% mid LCx stenosis and heavily calcified 60% proximal RCA stenosis.  - Manage CAD medically given no critical lesion and no chest pain.  - Continue ASA 81 and atorvastatin.  5. COPD: She smokes and uses home oxygen at times.  - Needs to quit smoking.  6. Sciatica: Significant pain.  Per [facility].  7. OSA: Suspected.  - Will need sleep study.   Marca Ancona 08/09/2023 9:20 AM   Length of Stay: 3  Marca Ancona, MD  08/09/2023, 9:20 AM  Advanced Heart Failure Team Pager [phone] (M-F; 7a - 5p)   Please contact [facility] for night-coverage after hours (5p -7a ) and weekends on [email or web address]

## 2023-08-09 NOTE — Progress Notes (Signed)
Peripherally Inserted Central Catheter Placement  The IV Nurse has discussed with the patient and/or persons authorized to consent for the patient, the purpose of this procedure and the potential benefits and risks involved with this procedure.  The benefits include less needle sticks, lab draws from the catheter, and the patient may be discharged home with the catheter. Risks include, but not limited to, infection, bleeding, blood clot (thrombus formation), and puncture of an artery; nerve damage and irregular heartbeat and possibility to perform a PICC exchange if needed/ordered by physician.  Alternatives to this procedure were also discussed.  Bard Power PICC patient education guide, fact sheet on infection prevention and patient information card has been provided to patient /or left at bedside.    PICC Placement Documentation  PICC Triple Lumen 08/09/23 Right Basilic 38 cm 0 cm (Active)  Indication for Insertion or Continuance of Line Vasoactive infusions 08/09/23 1513  Exposed Catheter (cm) 0 cm 08/09/23 1513  Site Assessment Clean, Dry, Intact 08/09/23 1513  Lumen #1 Status Flushed;Blood return noted;Saline locked 08/09/23 1513  Lumen #2 Status Flushed;Blood return noted;Saline locked 08/09/23 1513  Lumen #3 Status Flushed;Blood return noted;Saline locked 08/09/23 1513  Dressing Type Transparent 08/09/23 1513  Dressing Status Antimicrobial disc in place 08/09/23 1513  Line Care Connections checked and tightened 08/09/23 1513  Line Adjustment (NICU/IV Team Only) No 08/09/23 1513  Dressing Intervention New dressing 08/09/23 1513  Dressing Change Due 08/16/23 08/09/23 1513       Audrie Gallus 08/09/2023, 3:15 PM

## 2023-08-09 NOTE — TOC Initial Note (Signed)
Transition of Care [facility]) - Initial/Assessment Note    Patient Details  Name: Autumn [name] MRN: [id number] Date of Birth: July 16, 1956  Transition of Care [facility] CM/SW Contact:    Nicanor Bake Phone Number: [phone] 08/09/2023, 3:34 PM  Clinical Narrative:  2:52 PM- HF CSW attempted to meet with with pt at bedside. Pt was in a sterile procedure. CSW will follow up with pt at a later time.   TOC will continue following.                         Patient Goals and CMS Choice            Expected Discharge Plan and Services                                              Prior Living Arrangements/Services                       Activities of Daily Living   ADL Screening (condition at time of admission) Independently performs ADLs?: Yes (appropriate for developmental age) Is the patient deaf or have difficulty hearing?: No Does the patient have difficulty seeing, even when wearing glasses/contacts?: No Does the patient have difficulty concentrating, remembering, or making decisions?: No  Permission Sought/Granted                  Emotional Assessment              Admission diagnosis:  Hypokalemia [E87.6] Sciatica of left side [M54.32] Atrial fibrillation with RVR (HCC) [I48.91] Noncompliance w/medication treatment due to intermit use of medication [Z91.148] Acute on chronic congestive heart failure, unspecified heart failure type (HCC) [I50.9] Patient Active Problem List   Diagnosis Date Noted   Atrial fibrillation with RVR (HCC) 08/06/2023   Sciatica of left side 08/06/2023   Hypokalemia 08/06/2023   Acute exacerbation of CHF (congestive heart failure) (HCC) 05/31/2023   Acute on chronic congestive heart failure (HCC) 02/22/2023   Paroxysmal atrial fibrillation (HCC) 02/22/2023   Acute hypoxic respiratory failure (HCC) 02/17/2023   Acute on chronic systolic heart failure (HCC) 01/18/2023   COPD (chronic obstructive  pulmonary disease) (HCC) 12/03/2022   Parkinson's disease (HCC) 12/03/2022   PAD (peripheral artery disease) (HCC) 12/03/2022   CAD (coronary artery disease) S/P stent to LAD 12/03/2022   Atrial fibrillation with rapid ventricular response (HCC) 01/11/2021   Tobacco dependence 01/11/2021   Homelessness 01/11/2021   Alcohol dependence with uncomplicated withdrawal (HCC) 06/27/2017   Polysubstance abuse (HCC) 06/27/2017   Depression 06/27/2017   Essential hypertension 06/27/2017   GERD (gastroesophageal reflux disease) 06/27/2017   Attention deficit disorder (ADD)    PCP:  Corrington, Meredith Mody, MD Pharmacy:   [facility] [address] [place] [address] Phone: [phone] Fax: [phone]  [facility] #[id number] [place], [place] - [address] AT [facility] [address] [facility] [address] [place] [address] Phone: [phone] Fax: [phone]  [facility] #[id number] [place], [place] - [address] AT [facility] [address] [facility] [address] [facility] [address] [place] [address] Phone: [phone] Fax: [phone]     Social Drivers of Health (SDOH) Social History: SDOH Screenings   Food Insecurity: Patient Declined (08/06/2023)  Housing:  Patient Declined (08/06/2023)  Transportation Needs: Patient Declined (08/06/2023)  Recent Concern: Transportation Needs - Unmet Transportation Needs (05/17/2023)   Received from [facility]  Utilities: Patient Declined (08/06/2023)  Alcohol Screen: Low Risk  (06/02/2023)  Financial Resource Strain: Low Risk  (06/02/2023)  Physical Activity: Unknown (05/17/2023)   Received from [facility]  Social Connections: Socially Integrated (05/17/2023)   Received from [facility]  Stress: No Stress Concern Present (05/17/2023)   Received from [facility]  Tobacco Use: High Risk (08/06/2023)   SDOH Interventions:     Readmission Risk Interventions    01/21/2023    1:29 PM  12/07/2022    1:27 PM  Readmission Risk Prevention Plan  Transportation Screening Complete Complete  PCP or Specialist Appt within 3-5 Days  Complete  HRI or Home Care Consult  Complete  Social Work Consult for Recovery Care Planning/Counseling  Complete  Palliative Care Screening  Not Applicable  Medication Review Oceanographer) Complete Complete  PCP or Specialist appointment within 3-5 days of discharge Complete   HRI or Home Care Consult Complete   SW Recovery Care/Counseling Consult Complete   Palliative Care Screening Not Applicable   Skilled Nursing Facility Not Applicable

## 2023-08-09 NOTE — Plan of Care (Signed)
  Problem: Clinical Measurements: Goal: Ability to maintain clinical measurements within normal limits will improve Outcome: Progressing Goal: Respiratory complications will improve Outcome: Progressing Goal: Cardiovascular complication will be avoided Outcome: Progressing   Problem: Safety: Goal: Ability to remain free from injury will improve Outcome: Progressing   Problem: Cardiac: Goal: Ability to achieve and maintain adequate cardiopulmonary perfusion will improve Outcome: Progressing

## 2023-08-09 NOTE — Progress Notes (Signed)
Pressure bag/CVP monitor set up as per order

## 2023-08-09 NOTE — Progress Notes (Signed)
Patient yelling and cursing at staff because she did not get her dinner tray at 1830. Explained to patient that food services are no longer bringing dinner trays up and offered patient a frozen meal, and peanut butter crackers. Patient continued to yell and complain about her current situation. Attempted to verbally redirect patient. Patient accepted food options offered.

## 2023-08-09 NOTE — Interval H&P Note (Signed)
History and Physical Interval Note:  08/09/2023 8:29 AM  Autumn [name]  has presented today for surgery, with the diagnosis of HF.  The various methods of treatment have been discussed with the patient and family. After consideration of risks, benefits and other options for treatment, the patient has consented to  Procedure(s): RIGHT/LEFT HEART CATH AND CORONARY ANGIOGRAPHY (N/A) as a surgical intervention.  The patient's history has been reviewed, patient examined, no change in status, stable for surgery.  I have reviewed the patient's chart and labs.  Questions were answered to the patient's satisfaction.     [name] [employer]

## 2023-08-09 NOTE — Progress Notes (Signed)
ANTICOAGULATION CONSULT NOTE   Pharmacy Consult for Heparin Indication: atrial fibrillation  Allergies  Allergen Reactions   Pamelor [Nortriptyline Hcl] Anaphylaxis   Vibra-Tab [Doxycycline] Other (See Comments)    Unknown reaction    Patient Measurements: Height: 5\' 4"  (162.6 cm) Weight: 96.8 kg (213 lb 6.4 oz) IBW/kg (Calculated) : 54.7 Heparin Dosing Weight: 77.4 kg  Vital Signs: Temp: 98.4 F (36.9 C) (12/30 2018) Temp Source: Axillary (12/30 2018) BP: 131/70 (12/30 2018) Pulse Rate: 66 (12/30 2018)  Labs: Recent Labs    08/07/23 0850 08/07/23 0850 08/08/23 0735 08/08/23 0948 08/08/23 1548 08/08/23 2202 08/09/23 0405 08/09/23 0845 08/09/23 2007  HGB  --    < > 12.9  --   --   --  12.8 13.3  12.9  --   HCT  --   --  40.3  --   --   --  40.0 39.0  38.0  --   PLT  --   --  294  --   --   --  273  --   --   APTT 88*  --  122*  --    < > 67* 61*  --  53*  HEPARINUNFRC 0.99*  --  0.86*  --   --   --  0.49  --   --   CREATININE 0.95  --   --  1.19*  --   --  1.13*  --   --    < > = values in this interval not displayed.    Estimated Creatinine Clearance: 55.3 mL/min (A) (by C-G formula based on SCr of 1.13 mg/dL (H)).   Medical History: Past Medical History:  Diagnosis Date   Anxiety    Arthritis    Attention deficit disorder (ADD)    COPD (chronic obstructive pulmonary disease) (HCC)    Depression    Drug abuse (HCC)    Fibromyalgia    HFrEF (heart failure with reduced ejection fraction) (HCC)    Hyperlipidemia    Paroxysmal Atrial fibrillation     Medications:  Medications Prior to Admission  Medication Sig Dispense Refill Last Dose/Taking   albuterol (VENTOLIN HFA) 108 (90 Base) MCG/ACT inhaler Inhale 2 puffs into the lungs every 4 (four) hours as needed for wheezing.   Past Week   ARIPiprazole (ABILIFY) 5 MG tablet Take 1 tablet (5 mg total) by mouth daily. 30 tablet 0 Past Week   aspirin (ASPIRIN CHILDRENS) 81 MG chewable tablet Chew 1 tablet  (81 mg total) by mouth daily. 30 tablet 11 Past Week   atorvastatin (LIPITOR) 80 MG tablet Take 1 tablet (80 mg total) by mouth daily. 30 tablet 0 Past Week   Budeson-Glycopyrrol-Formoterol (BREZTRI AEROSPHERE) 160-9-4.8 MCG/ACT AERO Inhale 2 puffs into the lungs in the morning and at bedtime. 10.7 g 0 Past Week   dapagliflozin propanediol (FARXIGA) 10 MG TABS tablet Take 1 tablet (10 mg total) by mouth daily. 30 tablet 0 Past Week   digoxin (LANOXIN) 0.125 MG tablet Take 1 tablet (0.125 mg total) by mouth daily. 30 tablet 3 Past Week   fluticasone (FLONASE) 50 MCG/ACT nasal spray Place 2 sprays into both nostrils daily as needed for allergies.   Past Week   gabapentin (NEURONTIN) 300 MG capsule Take 2 capsules (600 mg total) by mouth 3 (three) times daily as needed (Takes 2-3 as needed). (Patient taking differently: Take 600 mg by mouth 3 (three) times daily as needed (for pain).) 90 capsule 0 Past Week  metoprolol succinate (TOPROL-XL) 100 MG 24 hr tablet Take 1 tablet (100 mg total) by mouth daily. Take with or immediately following a meal. 30 tablet 0 Past Week   naloxone (NARCAN) nasal spray 4 mg/0.1 mL Place 4 mg into the nose as needed (overdose, as needed).   Past Month   pantoprazole (PROTONIX) 40 MG tablet Take 1 tablet (40 mg total) by mouth daily. 30 tablet 0 Past Week   rivaroxaban (XARELTO) 20 MG TABS tablet Take 1 tablet (20 mg total) by mouth daily with supper. 30 tablet 0 Past Week   spironolactone (ALDACTONE) 25 MG tablet Take 0.5 tablets (12.5 mg total) by mouth daily. 45 tablet 3 Past Week   torsemide (DEMADEX) 20 MG tablet TAKE 60MG  (3 TABLETS) IN THE MORNING AND 40MG  (2 TABLETS) IN THE EVENING 180 tablet 1 Past Week   amiodarone (PACERONE) 200 MG tablet Take 2 tablets (400 mg total) by mouth 2 (two) times daily. (Patient not taking: Reported on 08/06/2023) 120 tablet 0 Not Taking   Budeson-Glycopyrrol-Formoterol (BREZTRI AEROSPHERE) 160-9-4.8 MCG/ACT AERO Inhale 2 puffs into the  lungs 2 (two) times daily. (Patient not taking: Reported on 08/06/2023)   Not Taking   DULoxetine (CYMBALTA) 30 MG capsule Take 1 capsule (30 mg total) by mouth daily. (Patient not taking: Reported on 08/06/2023) 30 capsule 0 Not Taking   nicotine (NICODERM CQ - DOSED IN MG/24 HOURS) 14 mg/24hr patch Place 1 patch (14 mg total) onto the skin daily. (Patient not taking: Reported on 08/06/2023) 28 patch 0 Not Taking   Scheduled:   ARIPiprazole  5 mg Oral Daily   aspirin  81 mg Oral Daily   atorvastatin  80 mg Oral Daily   Chlorhexidine Gluconate Cloth  6 each Topical Daily   dapagliflozin propanediol  10 mg Oral Daily   digoxin  0.125 mg Oral Daily   feeding supplement  237 mL Oral BID BM   fluticasone furoate-vilanterol  1 puff Inhalation Daily   And   umeclidinium bromide  1 puff Inhalation Daily   methocarbamol  500 mg Oral TID   metoprolol succinate  100 mg Oral Daily   pantoprazole  40 mg Oral Daily   sodium chloride flush  10-40 mL Intracatheter Q12H   sodium chloride flush  3 mL Intravenous Q12H   spironolactone  25 mg Oral Daily   Infusions:   sodium chloride     amiodarone 30 mg/hr (08/09/23 1553)   furosemide (LASIX) 200 mg in dextrose 5 % 100 mL (2 mg/mL) infusion 12 mg/hr (08/09/23 1152)   heparin 1,400 Units/hr (08/09/23 1551)   magnesium sulfate bolus IVPB     milrinone 0.25 mcg/kg/min (08/09/23 1122)   PRN: sodium chloride, acetaminophen, fluticasone, gabapentin, hydrALAZINE, labetalol, levalbuterol, nicotine, ondansetron **OR** ondansetron (ZOFRAN) IV, ondansetron (ZOFRAN) IV, oxyCODONE-acetaminophen, senna-docusate, sodium chloride flush, sodium chloride flush  Assessment: 77 yof with a history of HF, CAD s/p prior DES to LAD, AF, mitral regurgitation, COPD. Patient is presenting with left-sided sciatic pain and AF. Heparin per pharmacy consult placed for atrial fibrillation. Patient is on Xarelto prior to arrival. Last dose is unknown and noncompliant as  outpatient.  Heparin stopped for [facility] today - to resume 2h after radial band removed. aPTT was subtherapeutic this am on 1200 units/h. TR band about to be removed ~1215 per RN.  12/30 PM update: aPTT 53 seconds No signs of bleeding or issues with heparin gtt  Goal of Therapy:  Heparin level 0.3-0.7 units/ml aPTT 66-102 seconds Monitor  platelets by anticoagulation protocol: Yes   Plan:  Increase heparin to 1600 units/h Check HL / aPTT w/ AM labs   Greta Doom BS, PharmD, BCPS Clinical Pharmacist 08/09/2023 8:41 PM  Contact: [phone] after 3 PM  "Be curious, not judgmental..." -Debbora Dus

## 2023-08-10 ENCOUNTER — Inpatient Hospital Stay (HOSPITAL_COMMUNITY): Payer: 59 | Admitting: Anesthesiology

## 2023-08-10 ENCOUNTER — Inpatient Hospital Stay (HOSPITAL_COMMUNITY): Payer: 59

## 2023-08-10 ENCOUNTER — Encounter (HOSPITAL_COMMUNITY): Payer: Self-pay | Admitting: Student

## 2023-08-10 ENCOUNTER — Encounter (HOSPITAL_COMMUNITY)
Admission: EM | Disposition: A | Discharge status: Home / Self Care | Payer: Self-pay | Source: Home / Self Care | Attending: Internal Medicine

## 2023-08-10 DIAGNOSIS — I340 Nonrheumatic mitral (valve) insufficiency: Secondary | ICD-10-CM | POA: Diagnosis not present

## 2023-08-10 DIAGNOSIS — I4891 Unspecified atrial fibrillation: Secondary | ICD-10-CM

## 2023-08-10 DIAGNOSIS — I5023 Acute on chronic systolic (congestive) heart failure: Secondary | ICD-10-CM | POA: Diagnosis not present

## 2023-08-10 HISTORY — PX: TRANSESOPHAGEAL ECHOCARDIOGRAM (CATH LAB): EP1270

## 2023-08-10 HISTORY — PX: CARDIOVERSION: EP1203

## 2023-08-10 LAB — ECHO TEE
MV M vel: 4.73 m/s
MV Peak grad: 89.5 mm[Hg]
Radius: 0.5 cm

## 2023-08-10 LAB — COOXEMETRY PANEL
Carboxyhemoglobin: 1.3 % (ref 0.5–1.5)
Methemoglobin: <0.7 % (ref 0.0–1.5)
O2 Saturation: 59 %
Total hemoglobin: 13.9 g/dL (ref 12.0–16.0)

## 2023-08-10 LAB — CBC
HCT: 41.9 % (ref 36.0–46.0)
Hemoglobin: 13.6 g/dL (ref 12.0–15.0)
MCH: 27.5 pg (ref 26.0–34.0)
MCHC: 32.5 g/dL (ref 30.0–36.0)
MCV: 84.6 fL (ref 80.0–100.0)
Platelets: 302 10*3/uL (ref 150–400)
RBC: 4.95 MIL/uL (ref 3.87–5.11)
RDW: 15.7 % — ABNORMAL HIGH (ref 11.5–15.5)
WBC: 9 10*3/uL (ref 4.0–10.5)
nRBC: 0 % (ref 0.0–0.2)

## 2023-08-10 LAB — BASIC METABOLIC PANEL
Anion gap: 11 (ref 5–15)
BUN: 20 mg/dL (ref 8–23)
CO2: 30 mmol/L (ref 22–32)
Calcium: 9.3 mg/dL (ref 8.9–10.3)
Chloride: 94 mmol/L — ABNORMAL LOW (ref 98–111)
Creatinine, Ser: 1.1 mg/dL — ABNORMAL HIGH (ref 0.44–1.00)
GFR, Estimated: 55 mL/min — ABNORMAL LOW (ref >60)
Glucose, Bld: 113 mg/dL — ABNORMAL HIGH (ref 70–99)
Potassium: 4 mmol/L (ref 3.5–5.1)
Sodium: 135 mmol/L (ref 135–145)

## 2023-08-10 LAB — APTT
aPTT: 89 s — ABNORMAL HIGH (ref 24–36)
aPTT: 72 s — ABNORMAL HIGH (ref 24–36)

## 2023-08-10 LAB — HEPARIN LEVEL (UNFRACTIONATED): Heparin Unfractionated: 0.71 [IU]/mL — ABNORMAL HIGH (ref 0.30–0.70)

## 2023-08-10 SURGERY — TRANSESOPHAGEAL ECHOCARDIOGRAM (TEE) (CATHLAB)
Anesthesia: Monitor Anesthesia Care

## 2023-08-10 MED ORDER — PROPOFOL 500 MG/50ML IV EMUL
INTRAVENOUS | Status: DC | PRN
  Administered 2023-08-10: 50 ug/kg/min via INTRAVENOUS

## 2023-08-10 MED ORDER — SENNOSIDES-DOCUSATE SODIUM 8.6-50 MG PO TABS
1.0000 | ORAL_TABLET | Freq: Two times a day (BID) | ORAL | Status: DC
  Administered 2023-08-10 – 2023-08-12 (×5): 1 via ORAL
  Filled 2023-08-10 (×5): qty 1

## 2023-08-10 MED ORDER — POTASSIUM CHLORIDE CRYS ER 20 MEQ PO TBCR
40.0000 meq | EXTENDED_RELEASE_TABLET | Freq: Once | ORAL | Status: AC
  Administered 2023-08-10: 40 meq via ORAL
  Filled 2023-08-10: qty 2

## 2023-08-10 MED ORDER — LIDOCAINE 2% (20 MG/ML) 5 ML SYRINGE
INTRAMUSCULAR | Status: DC | PRN
  Administered 2023-08-10 (×2): 50 mg via INTRAVENOUS

## 2023-08-10 MED ORDER — POLYETHYLENE GLYCOL 3350 17 G PO PACK
17.0000 g | PACK | Freq: Every day | ORAL | Status: DC
  Administered 2023-08-10 – 2023-08-11 (×2): 17 g via ORAL
  Filled 2023-08-10 (×2): qty 1

## 2023-08-10 MED ORDER — PROPOFOL 10 MG/ML IV BOLUS
INTRAVENOUS | Status: DC | PRN
  Administered 2023-08-10: 30 mg via INTRAVENOUS

## 2023-08-10 MED ORDER — PHENYLEPHRINE HCL (PRESSORS) 10 MG/ML IV SOLN
INTRAVENOUS | Status: DC | PRN
  Administered 2023-08-10: 240 ug via INTRAVENOUS
  Administered 2023-08-10: 160 ug via INTRAVENOUS

## 2023-08-10 MED ORDER — METOPROLOL SUCCINATE ER 50 MG PO TB24
50.0000 mg | ORAL_TABLET | Freq: Every day | ORAL | Status: DC
  Administered 2023-08-10 – 2023-08-11 (×2): 50 mg via ORAL
  Filled 2023-08-10 (×2): qty 1

## 2023-08-10 MED ORDER — BISACODYL 10 MG RE SUPP
10.0000 mg | Freq: Once | RECTAL | Status: AC
  Administered 2023-08-10: 10 mg via RECTAL
  Filled 2023-08-10: qty 1

## 2023-08-10 SURGICAL SUPPLY — 1 items: PAD DEFIB RADIO PHYSIO CONN (PAD) ×1 IMPLANT

## 2023-08-10 NOTE — Progress Notes (Signed)
 Mobility Specialist Progress Note:    08/10/23 1500  Mobility  Activity Transferred from bed to chair  Level of Assistance Contact guard assist, steadying assist  Assistive Device Front wheel walker  Distance Ambulated (ft) 4 ft  Activity Response Tolerated well  Mobility Referral Yes  Mobility visit 1 Mobility  Mobility Specialist Start Time (ACUTE ONLY) 1457  Mobility Specialist Stop Time (ACUTE ONLY) 1512  Mobility Specialist Time Calculation (min) (ACUTE ONLY) 15 min   Pt received in bed, agreeable to mobility. C/o abdominal pain and constipation. Able to stand and pivot to chair w/ CG. No complaints throughout transfer. Pt left in chair with call bell and all needs met.  D'Vante Nicholaus Mobility Specialist Please contact via Special Educational Needs Teacher or Rehab office at [phone]

## 2023-08-10 NOTE — CV Procedure (Signed)
 Procedure: TEE  Sedation: Per anesthesiology  Findings: Please see echo section for full report.  Mildly dilated left ventricle with normal wall thickness.  EF 25-30%, diffuse hypokinesis.  Normal RV size with moderately decreased systolic function.  Moderate left atrial enlargement, no LA appendage thrombus.  Mild right atrial enlargement.  Small PFO by color doppler.  Mild TR, peak RV-RA gradient 19 mmHg.  Moderate mitral regurgitation.  Trileaflet aortic valve with calcification, no AS or AI.  Normal caliber thoracic aorta with mild plaque.   May proceed with DCCV.   Autumn [name] 08/10/2023 8:07 AM

## 2023-08-10 NOTE — Interval H&P Note (Signed)
 History and Physical Interval Note:  08/10/2023 7:40 AM  Autumn [name]  has presented today for surgery, with the diagnosis of afib.  The various methods of treatment have been discussed with the patient and family. After consideration of risks, benefits and other options for treatment, the patient has consented to  Procedure(s): TRANSESOPHAGEAL ECHOCARDIOGRAM (N/A) CARDIOVERSION (N/A) as a surgical intervention.  The patient's history has been reviewed, patient examined, no change in status, stable for surgery.  I have reviewed the patient's chart and labs.  Questions were answered to the patient's satisfaction.     Neila Teem [employer]

## 2023-08-10 NOTE — Progress Notes (Signed)
 Patient ID: Autumn [name], female   DOB: 03-Apr-1956, 67 y.o.   MRN: [id number]     Advanced Heart Failure Rounding Note  Cardiologist: Dub Huntsman, DO   Subjective:   Chief Complaint: Shortness of breath  I/Os net negative 2420 with weight down.  Breathing getting better.  CVP from chart around 11.   TEE today with EF 25-30%, moderate RV dysfunction, moderate MR.  Patient had DCCV back to NSR.   RHC/LHC: Coronary Findings  Diagnostic Dominance: Right Left Anterior Descending  Mid LAD lesion is 20% stenosed. The lesion was previously treated .    Second Diagonal Branch  2nd Diag lesion is 80% stenosed.    Left Circumflex  Mid Cx lesion is 75% stenosed.    Right Coronary Artery  Prox RCA lesion is 60% stenosed. The lesion is calcified.    Intervention   No interventions have been documented.   Right Heart  Right Heart Pressures RHC Procedural Findings: Hemodynamics (mmHg) RA mean 18 RV 52/14 PA 57/32 mean 43 PCWP mean 28 LV 141/28 AO 139/86  Oxygen  saturations: PA 55% AO 100%  Cardiac Output (Fick) 3.41  Cardiac Index (Fick) 1.7 PVR 4.4 WU  PAPi 1.4     Objective:   Weight Range: 95.4 kg Body mass index is 36.1 kg/m.   Vital Signs:   Temp:  [97.6 F (36.4 C)-98.4 F (36.9 C)] 97.6 F (36.4 C) (12/31 0808) Pulse Rate:  [0-99] 53 (12/31 0808) Resp:  [15-35] 18 (12/31 0808) BP: (61-137)/(39-86) 109/59 (12/31 0710) SpO2:  [93 %-100 %] 98 % (12/31 0808) Weight:  [95.4 kg] 95.4 kg (12/31 0315) Last BM Date : 08/07/23  Weight change: Filed Weights   08/08/23 0349 08/09/23 0353 08/10/23 0315  Weight: 97.5 kg 96.8 kg 95.4 kg    Intake/Output:   Intake/Output Summary (Last 24 hours) at 08/10/2023 0811 Last data filed at 08/10/2023 0501 Gross per 24 hour  Intake 1280 ml  Output 3700 ml  Net -2420 ml      Physical Exam    General: NAD Neck: JVP 10 cm, no thyromegaly or thyroid nodule.  Lungs: Clear to auscultation bilaterally with  normal respiratory effort. CV: Nondisplaced PMI.  Heart irregular S1/S2, no S3/S4, no murmur.  Trace ankle edema.   Abdomen: Soft, nontender, no hepatosplenomegaly, no distention.  Skin: Intact without lesions or rashes.  Neurologic: Alert and oriented x 3.  Psych: Normal affect. Extremities: No clubbing or cyanosis.  HEENT: Normal.   Telemetry   Atrial fibrillation rate 80s => NSR with PACs (personally reviewed)  Labs    CBC Recent Labs    08/09/23 0405 08/09/23 0845 08/10/23 0350  WBC 7.4  --  9.0  HGB 12.8 13.3  12.9 13.6  HCT 40.0 39.0  38.0 41.9  MCV 84.9  --  84.6  PLT 273  --  302   Basic Metabolic Panel Recent Labs    87/69/75 0405 08/09/23 0845 08/10/23 0350  NA 137 133*  130* 135  K 4.8 4.3  4.2 4.0  CL 99  --  94*  CO2 26  --  30  GLUCOSE 116*  --  113*  BUN 20  --  20  CREATININE 1.13*  --  1.10*  CALCIUM  9.3  --  9.3  MG 1.7  --   --    Liver Function Tests No results for input(s): AST, ALT, ALKPHOS, BILITOT, PROT, ALBUMIN in the last 72 hours. No results for input(s): LIPASE, AMYLASE in the  last 72 hours. Cardiac Enzymes No results for input(s): CKTOTAL, CKMB, CKMBINDEX, TROPONINI in the last 72 hours.  BNP: BNP (last 3 results) Recent Labs    05/31/23 0712 07/02/23 1153 08/06/23 1802  BNP 606.9* 411.5* 238.2*    ProBNP (last 3 results) No results for input(s): PROBNP in the last 8760 hours.   D-Dimer No results for input(s): DDIMER in the last 72 hours. Hemoglobin A1C Recent Labs    08/08/23 0735  HGBA1C 6.7*   Fasting Lipid Panel No results for input(s): CHOL, HDL, LDLCALC, TRIG, CHOLHDL, LDLDIRECT in the last 72 hours. Thyroid Function Tests No results for input(s): TSH, T4TOTAL, T3FREE, THYROIDAB in the last 72 hours.  Invalid input(s): FREET3   Other results:   Imaging    EP STUDY Result Date: 08/10/2023 See surgical note for result.  DG Chest Port 1  View Result Date: 08/09/2023 CLINICAL DATA:  Status post PICC line placement. EXAM: PORTABLE CHEST 1 VIEW COMPARISON:  08/06/2023 FINDINGS: There is a right arm PICC line with tip in the SVC. Stable cardiomediastinal contours. Pulmonary vascular congestion. No pleural fluid or frank interstitial edema. Mild subsegmental atelectasis in the left base. IMPRESSION: 1. Interval placement of right arm PICC line with tip in the SVC. 2. Pulmonary vascular congestion. Electronically Signed   By: Waddell Calk M.D.   On: 08/09/2023 17:10   CARDIAC CATHETERIZATION Result Date: 08/09/2023   Prox RCA lesion is 60% stenosed.   Mid LAD lesion is 20% stenosed.   2nd Diag lesion is 80% stenosed.   Mid Cx lesion is 75% stenosed. 1. Elevated right and left heart filling pressures. 2. Mixed pulmonary venous/pulmonary arterial hypertension. 3. Low PAPi 4. Low CI, 1.7. 5. Patent LAD stent.  75% mid LCx stenosis and heavily calcified 60% proximal RCA stenosis.  Manage CAD medically given no critical lesion and no chest pain.  Will need ongoing aggressive diuresis and will use milrinone  for now with low cardiac output.   US  EKG SITE RITE Result Date: 08/09/2023 If Site Rite image not attached, placement could not be confirmed due to current cardiac rhythm.     Medications:     Scheduled Medications:  [MAR Hold] ARIPiprazole   5 mg Oral Daily   [MAR Hold] aspirin   81 mg Oral Daily   [MAR Hold] atorvastatin   80 mg Oral Daily   [MAR Hold] Chlorhexidine  Gluconate Cloth  6 each Topical Daily   [MAR Hold] dapagliflozin  propanediol  10 mg Oral Daily   [MAR Hold] digoxin   0.125 mg Oral Daily   [MAR Hold] feeding supplement  237 mL Oral BID BM   [MAR Hold] fluticasone  furoate-vilanterol  1 puff Inhalation Daily   And   [MAR Hold] umeclidinium bromide   1 puff Inhalation Daily   [MAR Hold] methocarbamol   500 mg Oral TID   metoprolol  succinate  50 mg Oral Daily   [MAR Hold] pantoprazole   40 mg Oral Daily   [MAR Hold]  sodium chloride  flush  10-40 mL Intracatheter Q12H   [MAR Hold] spironolactone   25 mg Oral Daily    Infusions:  amiodarone  30 mg/hr (08/10/23 0740)   furosemide  (LASIX ) 200 mg in dextrose  5 % 100 mL (2 mg/mL) infusion 12 mg/hr (08/10/23 0740)   heparin  1,600 Units/hr (08/10/23 0740)   [MAR Hold] magnesium  sulfate bolus IVPB     milrinone  0.125 mcg/kg/min (08/10/23 0740)    PRN Medications: [MAR Hold] acetaminophen , [MAR Hold] fluticasone , [MAR Hold] gabapentin , [MAR Hold] levalbuterol , [MAR Hold] nicotine , [MAR Hold] ondansetron  **  OR** [MAR Hold] ondansetron  (ZOFRAN ) IV, [MAR Hold] ondansetron  (ZOFRAN ) IV, [MAR Hold] oxyCODONE -acetaminophen , [MAR Hold] senna-docusate, [MAR Hold] sodium chloride  flush   Assessment/Plan   1. Acute on chronic systolic CHF: Echo in 6/22 with EF 60-65%.  Has had multiple echoes in 2024, each with lower EF.  Most recent echo in 10/24 with EF 30-35%, moderate-severe MR.  She has a history of CAD but denies recent chest pain.  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  Cath 12/30 showed significant CAD but no critical lesion.  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  She was admitted with volume overload and dyspnea.  RHC 12/30 with elevated right and left heart filling pressures, low PAPi and low CI at 1.7. TEE today with EF 25-30%, moderate RV dysfunction, moderate MR.  Co-ox 59% today.  CVP 11 from chart. Now has been cardioverted back to NSR.  - Decreased milrinone  to 0.125 for cardioversion, continue at this dosing while diuresing.  - Lasix  gtt 12 mg/hr for 1 more day, replace K.   - Continue Farxiga  10 daily - Continue spironolactone  25 daily - Continue digoxin  0.125 daily - Decrease Toprol  XL to 50 mg daily with low output on RHC.  - Can add Entresto  if BP remains stable, not today.  2. Atrial fibrillation: Persistent, multiple admissions with AF/RVR.  Says she has never been cardioverted.  On  Xarelto  at home, heparin  gtt currently. DCCV to NSR on 12/30 but still with frequent PACs.  - Continue amiodarone  gtt 30 mg/hr.  - Continue Toprol  XL and digoxin .  - Continue heparin  gtt, transition back to Xarelto  eventually.  - Would favor eventual AF ablation.  3. 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 Moderate on TEE today.  4. CAD: DES to LAD in 9/20.  No recent chest pain. LHC today showed patent LAD stent. 75% mid LCx stenosis and heavily calcified 60% proximal RCA stenosis.  - Manage CAD medically given no critical lesion and no chest pain.  - Continue ASA 81 and atorvastatin .  5. COPD: She smokes and uses home oxygen  at times.  - Needs to quit smoking.  6. Sciatica: Significant pain.  Per [facility].  7. OSA: Suspected.  - Will need sleep study.  8. PFO: Small, noted on TEE.   Mobilize.   Ezra Shuck 08/10/2023 8:11 AM   Length of Stay: 4  Ezra Shuck, MD  08/10/2023, 8:11 AM  Advanced Heart Failure Team Pager [phone] (M-F; 7a - 5p)  Please contact [facility] for night-coverage after hours (5p -7a ) and weekends on [email or web address]

## 2023-08-10 NOTE — Procedures (Signed)
 Electrical Cardioversion Procedure Note Autumn [name] [id number] February 27, 1956  Procedure: Electrical Cardioversion Indications:  Atrial Fibrillation  Procedure Details Consent: Risks of procedure as well as the alternatives and risks of each were explained to the (patient/caregiver).  Consent for procedure obtained. Time Out: Verified patient identification, verified procedure, site/side was marked, verified correct patient position, special equipment/implants available, medications/allergies/relevent history reviewed, required imaging and test results available.  Performed  Patient placed on cardiac monitor, pulse oximetry, supplemental oxygen  as necessary.  Sedation given:  Per anesthesiology Pacer pads placed anterior and posterior chest.  Cardioverted 2 time(s).  Cardioverted at 360J with sternal pressure.  Evaluation Findings: Post procedure EKG shows: NSR Complications: None Patient did tolerate procedure well.   Ezra Shuck 08/10/2023, 8:08 AM

## 2023-08-10 NOTE — Anesthesia Postprocedure Evaluation (Signed)
 Anesthesia Post Note  Patient: Autumn [name]  Procedure(s) Performed: TRANSESOPHAGEAL ECHOCARDIOGRAM CARDIOVERSION     Patient location during evaluation: PACU Anesthesia Type: MAC Level of consciousness: awake and alert Pain management: pain level controlled Vital Signs Assessment: post-procedure vital signs reviewed and stable Respiratory status: spontaneous breathing, nonlabored ventilation and respiratory function stable Cardiovascular status: blood pressure returned to baseline Postop Assessment: no apparent nausea or vomiting Anesthetic complications: no   No notable events documented.  Last Vitals:  Vitals:   08/10/23 0818 08/10/23 0828  BP: 109/65 103/63  Pulse: 65 68  Resp: 18 18  Temp:    SpO2: 97% 98%    Last Pain:  Vitals:   08/10/23 0828  TempSrc:   PainSc: 0-No pain                 Vertell Row

## 2023-08-10 NOTE — Transfer of Care (Signed)
 Immediate Anesthesia Transfer of Care Note  Patient: Autumn [name]  Procedure(s) Performed: TRANSESOPHAGEAL ECHOCARDIOGRAM CARDIOVERSION  Patient Location: PACU and Cath Lab  Anesthesia Type:MAC  Level of Consciousness: awake and patient cooperative  Airway & Oxygen  Therapy: Patient Spontanous Breathing and Patient connected to nasal cannula oxygen   Post-op Assessment: Report given to RN and Post -op Vital signs reviewed and stable  Post vital signs: Reviewed and stable  Last Vitals:  Vitals Value Taken Time  BP 103/67 08/10/23 0809  Temp 36.4 C 08/10/23 0808  Pulse 58 08/10/23 0810  Resp 17 08/10/23 0810  SpO2 99 % 08/10/23 0810  Vitals shown include unfiled device data.  Last Pain:  Vitals:   08/10/23 0808  TempSrc: Tympanic  PainSc: Asleep      Patients Stated Pain Goal: 0 (08/09/23 0735)  Complications: No notable events documented.

## 2023-08-10 NOTE — Plan of Care (Signed)
  Problem: Education: Goal: Knowledge of General Education information will improve Description: Including pain rating scale, medication(s)/side effects and non-pharmacologic comfort measures Outcome: Progressing   Problem: Health Behavior/Discharge Planning: Goal: Ability to manage health-related needs will improve Outcome: Progressing   Problem: Clinical Measurements: Goal: Ability to maintain clinical measurements within normal limits will improve Outcome: Progressing Goal: Will remain free from infection Outcome: Progressing Goal: Diagnostic test results will improve Outcome: Progressing Goal: Respiratory complications will improve Outcome: Progressing Goal: Cardiovascular complication will be avoided Outcome: Not Progressing   Problem: Activity: Goal: Risk for activity intolerance will decrease Outcome: Progressing   Problem: Nutrition: Goal: Adequate nutrition will be maintained Outcome: Progressing   Problem: Coping: Goal: Level of anxiety will decrease Outcome: Progressing   Problem: Elimination: Goal: Will not experience complications related to bowel motility Outcome: Progressing Goal: Will not experience complications related to urinary retention Outcome: Progressing   Problem: Pain Management: Goal: General experience of comfort will improve Outcome: Progressing   Problem: Safety: Goal: Ability to remain free from injury will improve Outcome: Progressing   Problem: Skin Integrity: Goal: Risk for impaired skin integrity will decrease Outcome: Progressing   Problem: Education: Goal: Knowledge of disease or condition will improve Outcome: Progressing Goal: Understanding of medication regimen will improve Outcome: Progressing Goal: Individualized Educational Video(s) Outcome: Progressing   Problem: Activity: Goal: Ability to tolerate increased activity will improve Outcome: Progressing   Problem: Cardiac: Goal: Ability to achieve and maintain  adequate cardiopulmonary perfusion will improve Outcome: Progressing   Problem: Health Behavior/Discharge Planning: Goal: Ability to safely manage health-related needs after discharge will improve Outcome: Progressing   Problem: Education: Goal: Understanding of CV disease, CV risk reduction, and recovery process will improve Outcome: Progressing Goal: Individualized Educational Video(s) Outcome: Progressing   Problem: Activity: Goal: Ability to return to baseline activity level will improve Outcome: Progressing   Problem: Cardiovascular: Goal: Ability to achieve and maintain adequate cardiovascular perfusion will improve Outcome: Progressing Goal: Vascular access site(s) Level 0-1 will be maintained Outcome: Progressing   Problem: Health Behavior/Discharge Planning: Goal: Ability to safely manage health-related needs after discharge will improve Outcome: Progressing   Problem: Education: Goal: Understanding of CV disease, CV risk reduction, and recovery process will improve Outcome: Progressing Goal: Individualized Educational Video(s) Outcome: Progressing   Problem: Cardiovascular: Goal: Ability to achieve and maintain adequate cardiovascular perfusion will improve Outcome: Progressing Goal: Vascular access site(s) Level 0-1 will be maintained Outcome: Progressing   Problem: Activity: Goal: Ability to return to baseline activity level will improve Outcome: Progressing   Problem: Health Behavior/Discharge Planning: Goal: Ability to safely manage health-related needs after discharge will improve Outcome: Progressing

## 2023-08-10 NOTE — Progress Notes (Signed)
 ANTICOAGULATION CONSULT NOTE   Pharmacy Consult for Heparin  Indication: atrial fibrillation  Allergies  Allergen Reactions   Pamelor [Nortriptyline Hcl] Anaphylaxis   Vibra-Tab [Doxycycline] Other (See Comments)    Unknown reaction    Patient Measurements: Height: 5' 4 (162.6 cm) Weight: 95.4 kg (210 lb 5.1 oz) IBW/kg (Calculated) : 54.7 Heparin  Dosing Weight: 77.4 kg  Vital Signs: Temp: 97.9 F (36.6 C) (12/31 1244) Temp Source: Oral (12/31 1244) BP: 106/63 (12/31 1244) Pulse Rate: 64 (12/31 1244)  Labs: Recent Labs    08/08/23 0735 08/08/23 0948 08/08/23 1548 08/09/23 0405 08/09/23 0845 08/09/23 2007 08/10/23 0350 08/10/23 1148  HGB 12.9  --   --  12.8 13.3  12.9  --  13.6  --   HCT 40.3  --   --  40.0 39.0  38.0  --  41.9  --   PLT 294  --   --  273  --   --  302  --   APTT 122*  --    < > 61*  --  53* 89* 72*  HEPARINUNFRC 0.86*  --   --  0.49  --   --  0.71*  --   CREATININE  --  1.19*  --  1.13*  --   --  1.10*  --    < > = values in this interval not displayed.    Estimated Creatinine Clearance: 56.4 mL/min (A) (by C-G formula based on SCr of 1.1 mg/dL (H)).   Medical History: Past Medical History:  Diagnosis Date   Anxiety    Arthritis    Attention deficit disorder (ADD)    COPD (chronic obstructive pulmonary disease) (HCC)    Depression    Drug abuse (HCC)    Fibromyalgia    HFrEF (heart failure with reduced ejection fraction) (HCC)    Hyperlipidemia    Paroxysmal Atrial fibrillation     Medications:  Medications Prior to Admission  Medication Sig Dispense Refill Last Dose/Taking   albuterol  (VENTOLIN  HFA) 108 (90 Base) MCG/ACT inhaler Inhale 2 puffs into the lungs every 4 (four) hours as needed for wheezing.   Past Week   ARIPiprazole  (ABILIFY ) 5 MG tablet Take 1 tablet (5 mg total) by mouth daily. 30 tablet 0 Past Week   aspirin  (ASPIRIN  CHILDRENS) 81 MG chewable tablet Chew 1 tablet (81 mg total) by mouth daily. 30 tablet 11 Past Week    atorvastatin  (LIPITOR ) 80 MG tablet Take 1 tablet (80 mg total) by mouth daily. 30 tablet 0 Past Week   Budeson-Glycopyrrol-Formoterol  (BREZTRI  AEROSPHERE) 160-9-4.8 MCG/ACT AERO Inhale 2 puffs into the lungs in the morning and at bedtime. 10.7 g 0 Past Week   dapagliflozin  propanediol (FARXIGA ) 10 MG TABS tablet Take 1 tablet (10 mg total) by mouth daily. 30 tablet 0 Past Week   digoxin  (LANOXIN ) 0.125 MG tablet Take 1 tablet (0.125 mg total) by mouth daily. 30 tablet 3 Past Week   fluticasone  (FLONASE ) 50 MCG/ACT nasal spray Place 2 sprays into both nostrils daily as needed for allergies.   Past Week   gabapentin  (NEURONTIN ) 300 MG capsule Take 2 capsules (600 mg total) by mouth 3 (three) times daily as needed (Takes 2-3 as needed). (Patient taking differently: Take 600 mg by mouth 3 (three) times daily as needed (for pain).) 90 capsule 0 Past Week   metoprolol  succinate (TOPROL -XL) 100 MG 24 hr tablet Take 1 tablet (100 mg total) by mouth daily. Take with or immediately following a meal. 30 tablet  0 Past Week   naloxone  (NARCAN ) nasal spray 4 mg/0.1 mL Place 4 mg into the nose as needed (overdose, as needed).   Past Month   pantoprazole  (PROTONIX ) 40 MG tablet Take 1 tablet (40 mg total) by mouth daily. 30 tablet 0 Past Week   rivaroxaban  (XARELTO ) 20 MG TABS tablet Take 1 tablet (20 mg total) by mouth daily with supper. 30 tablet 0 Past Week   spironolactone  (ALDACTONE ) 25 MG tablet Take 0.5 tablets (12.5 mg total) by mouth daily. 45 tablet 3 Past Week   torsemide  (DEMADEX ) 20 MG tablet TAKE 60MG  (3 TABLETS) IN THE MORNING AND 40MG  (2 TABLETS) IN THE EVENING 180 tablet 1 Past Week   amiodarone  (PACERONE ) 200 MG tablet Take 2 tablets (400 mg total) by mouth 2 (two) times daily. (Patient not taking: Reported on 08/06/2023) 120 tablet 0 Not Taking   Budeson-Glycopyrrol-Formoterol  (BREZTRI  AEROSPHERE) 160-9-4.8 MCG/ACT AERO Inhale 2 puffs into the lungs 2 (two) times daily. (Patient not taking:  Reported on 08/06/2023)   Not Taking   DULoxetine  (CYMBALTA ) 30 MG capsule Take 1 capsule (30 mg total) by mouth daily. (Patient not taking: Reported on 08/06/2023) 30 capsule 0 Not Taking   nicotine  (NICODERM CQ  - DOSED IN MG/24 HOURS) 14 mg/24hr patch Place 1 patch (14 mg total) onto the skin daily. (Patient not taking: Reported on 08/06/2023) 28 patch 0 Not Taking   Scheduled:   ARIPiprazole   5 mg Oral Daily   aspirin   81 mg Oral Daily   atorvastatin   80 mg Oral Daily   Chlorhexidine  Gluconate Cloth  6 each Topical Daily   dapagliflozin  propanediol  10 mg Oral Daily   digoxin   0.125 mg Oral Daily   feeding supplement  237 mL Oral BID BM   fluticasone  furoate-vilanterol  1 puff Inhalation Daily   And   umeclidinium bromide   1 puff Inhalation Daily   methocarbamol   500 mg Oral TID   metoprolol  succinate  50 mg Oral Daily   pantoprazole   40 mg Oral Daily   polyethylene glycol  17 g Oral Daily   potassium chloride   40 mEq Oral Once   senna-docusate  1 tablet Oral BID   sodium chloride  flush  10-40 mL Intracatheter Q12H   spironolactone   25 mg Oral Daily   Infusions:   amiodarone  30 mg/hr (08/10/23 0740)   furosemide  (LASIX ) 200 mg in dextrose  5 % 100 mL (2 mg/mL) infusion 12 mg/hr (08/10/23 0740)   heparin  1,600 Units/hr (08/10/23 0740)   magnesium  sulfate bolus IVPB     milrinone  0.125 mcg/kg/min (08/10/23 0851)   PRN: acetaminophen , fluticasone , gabapentin , levalbuterol , nicotine , ondansetron  **OR** ondansetron  (ZOFRAN ) IV, ondansetron  (ZOFRAN ) IV, oxyCODONE -acetaminophen , sodium chloride  flush  Assessment: 43 yof with a history of HF, CAD s/p prior DES to LAD, AF, mitral regurgitation, COPD. Patient is presenting with left-sided sciatic pain and AF. Heparin  per pharmacy consult placed for atrial fibrillation. Patient is on Xarelto  prior to arrival. Last dose is unknown and pt is noncompliant as outpatient.  Heparin  resumed post/cath, aPTT therapeutic x2.   Goal of Therapy:   Heparin  level 0.3-0.7 units/ml aPTT 66-102 seconds Monitor platelets by anticoagulation protocol: Yes   Plan:  Continue heparin  1600 units/h Daily heparin  level, aPTT, CBC   Ozell Jamaica, PharmD, BCPS, [facility] Clinical Pharmacist [phone] Please check AMION for all [facility] numbers 08/10/2023

## 2023-08-10 NOTE — Progress Notes (Signed)
 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 She was found to be volume overloaded, so IV lasix , amiodarone  and heparin  infusions were started, and the patient admitted with cardiology consultation. XR demonstrates progressive degenerative disc disease involving L3-L4-L5 levels.   -RHC/LHC 12/30 with moderate to severe nonobstructive CAD, no critical disease, elevated filling pressures, wedge 28, cardiac index 1.7 -12/31 with DC cardioversion  Subjective: -Anxious about all her problems, breathing overall improving  Assessment and Plan:  A-fib with RVR: - History of persistent A-fib with previous unsuccessful cardioversions - Continue amiodarone  gtt.and heparin  gtt. - Continue metoprolol  succinate,  digoxin , -Cardioversion today   Acute on chronic systolic CHF  Moderate to severe mitral regurgitation -TTE in 05/2023 showed EF 30-35%, global HK, severe LAE, mod-severe MR. -CHF team following, on IV Lasix , Farxiga , Aldactone , Toprol , digoxin  -cath with significant CAD but no critical lesion, RHC with elevated filling pressures, low cardiac index -Started milrinone  12/30, 6.4 L negative -Increase activity   Advanced lumbar DDD, widespread OA, fibromyalgia: Known to have severe arthritis, has been seen by orthopedics in [facility] system previously.  - Continue scheduled gabapentin  and tylenol  as well as prn oxycodone .  -Add Robaxin , continue PT OT - Will need orthopedic/spine surgery follow up for this chronic progressive condition   Hypokalemia:  -Supplemented, improved  New diabetes mellitus, hyperglycemia -HbA1c is 6.7 consistent with new diabetes, continue Farxiga   CAD: s/p DES to  LAD in 2020.  - Continue ASA, BB, statin.  -Moderate noncritical CAD on LHC   Chronic hypoxic respiratory failure, COPD: - Quiescent - Continue home 2L O2.  - Continue breztri  and prn xopenex  -Looking cessation counseled   OSA: Not on CPAP therapy. -Outpatient follow-up with pulmonology   Anxiety, depression: Not on home medications.  - Continue aripiprazole , consider prn   Morbid obesity: Body mass index is 37.24 kg/m.    Tobacco use:  - Nicotine  patch prn   GERD:  - PPI   DVT prophylaxis: IV heparin  Code Status: Full code Family Communication: None present Disposition Plan: Home likely 48 hours  Consultants:    Procedures:   Antimicrobials:    Objective: Vitals:   08/10/23 0710 08/10/23 0808 08/10/23 0818 08/10/23 0828  BP: (!) 109/59 103/67 109/65 103/63  Pulse: 95 (!) 53 65 68  Resp: (!) 24 18 18 18   Temp: 98.4 F (36.9 C) 97.6 F (36.4 C)    TempSrc: Temporal Tympanic    SpO2: 95% 98% 97% 98%  Weight:      Height:        Intake/Output Summary (Last 24 hours) at 08/10/2023 1231 Last data filed at 08/10/2023 1200 Gross per 24 hour  Intake 1330 ml  Output 4200 ml  Net -2870 ml   Filed Weights   08/08/23 0349 08/09/23 0353 08/10/23 0315  Weight: 97.5 kg 96.8 kg 95.4 kg    Examination:  General exam: Obese chronically ill female sitting up in bed, AAOx3 HEENT: Positive JVD CVS: S1-S2, irregular rhythm Lungs: Clear bilaterally Extremities: Trace edema  Skin: No rashes Psychiatry:  Mood & affect appropriate.     Data Reviewed:   CBC: Recent Labs  Lab 08/06/23 1218 08/08/23 0735 08/09/23 0405 08/09/23 0845 08/10/23 0350  WBC  7.3 6.3 7.4  --  9.0  HGB 13.2 12.9 12.8 13.3  12.9 13.6  HCT 40.4 40.3 40.0 39.0  38.0 41.9  MCV 83.3 86.3 84.9  --  84.6  PLT 287 294 273  --  302   Basic Metabolic Panel: Recent Labs  Lab 08/06/23 1218 08/07/23 0543 08/07/23 0850 08/08/23 0948 08/09/23 0405 08/09/23 0845 08/10/23 0350  NA  133*  --  133* 135 137 133*  130* 135  K 2.8*  --  2.9* 4.2 4.8 4.3  4.2 4.0  CL 94*  --  94* 98 99  --  94*  CO2 27  --  27 26 26   --  30  GLUCOSE 102*  --  256* 138* 116*  --  113*  BUN 14  --  12 17 20   --  20  CREATININE 1.13*  --  0.95 1.19* 1.13*  --  1.10*  CALCIUM  9.1  --  9.0 9.0 9.3  --  9.3  MG  --  2.0  --   --  1.7  --   --   PHOS  --  4.9*  --   --   --   --   --    GFR: Estimated Creatinine Clearance: 56.4 mL/min (A) (by C-G formula based on SCr of 1.1 mg/dL (H)). Liver Function Tests: No results for input(s): AST, ALT, ALKPHOS, BILITOT, PROT, ALBUMIN in the last 168 hours. No results for input(s): LIPASE, AMYLASE in the last 168 hours. No results for input(s): AMMONIA in the last 168 hours. Coagulation Profile: No results for input(s): INR, PROTIME in the last 168 hours. Cardiac Enzymes: No results for input(s): CKTOTAL, CKMB, CKMBINDEX, TROPONINI in the last 168 hours. BNP (last 3 results) No results for input(s): PROBNP in the last 8760 hours. HbA1C: Recent Labs    08/08/23 0735  HGBA1C 6.7*   CBG: No results for input(s): GLUCAP in the last 168 hours. Lipid Profile: No results for input(s): CHOL, HDL, LDLCALC, TRIG, CHOLHDL, LDLDIRECT in the last 72 hours. Thyroid Function Tests: No results for input(s): TSH, T4TOTAL, FREET4, T3FREE, THYROIDAB in the last 72 hours.  Anemia Panel: No results for input(s): VITAMINB12, FOLATE, FERRITIN, TIBC, IRON , RETICCTPCT in the last 72 hours. Urine analysis:    Component Value Date/Time   COLORURINE STRAW (A) 01/13/2021 1234   APPEARANCEUR CLEAR 01/13/2021 1234   LABSPEC 1.003 (L) 01/13/2021 1234   PHURINE 5.0 01/13/2021 1234   GLUCOSEU NEGATIVE 01/13/2021 1234   HGBUR NEGATIVE 01/13/2021 1234   BILIRUBINUR NEGATIVE 01/13/2021 1234   KETONESUR NEGATIVE 01/13/2021 1234   PROTEINUR NEGATIVE 01/13/2021 1234   UROBILINOGEN 0.2 12/06/2012 1020    NITRITE NEGATIVE 01/13/2021 1234   LEUKOCYTESUR NEGATIVE 01/13/2021 1234   Sepsis Labs: @LABRCNTIP (procalcitonin:4,lacticidven:4)  )No results found for this or any previous visit (from the past 240 hours).   Radiology Studies: EP STUDY Result Date: 08/10/2023 See surgical note for result.  DG Chest Port 1 View Result Date: 08/09/2023 CLINICAL DATA:  Status post PICC line placement. EXAM: PORTABLE CHEST 1 VIEW COMPARISON:  08/06/2023 FINDINGS: There is a right arm PICC line with tip in the SVC. Stable cardiomediastinal contours. Pulmonary vascular congestion. No pleural fluid or frank interstitial edema. Mild subsegmental atelectasis in the left base. IMPRESSION: 1. Interval placement of right arm PICC line with tip in the SVC. 2. Pulmonary vascular congestion. Electronically Signed   By: Waddell Calk M.D.   On: 08/09/2023 17:10   CARDIAC CATHETERIZATION Result Date:  08/09/2023   Prox RCA lesion is 60% stenosed.   Mid LAD lesion is 20% stenosed.   2nd Diag lesion is 80% stenosed.   Mid Cx lesion is 75% stenosed. 1. Elevated right and left heart filling pressures. 2. Mixed pulmonary venous/pulmonary arterial hypertension. 3. Low PAPi 4. Low CI, 1.7. 5. Patent LAD stent.  75% mid LCx stenosis and heavily calcified 60% proximal RCA stenosis.  Manage CAD medically given no critical lesion and no chest pain.  Will need ongoing aggressive diuresis and will use milrinone  for now with low cardiac output.   US  EKG SITE RITE Result Date: 08/09/2023 If Site Rite image not attached, placement could not be confirmed due to current cardiac rhythm.    Scheduled Meds:  ARIPiprazole   5 mg Oral Daily   aspirin   81 mg Oral Daily   atorvastatin   80 mg Oral Daily   Chlorhexidine  Gluconate Cloth  6 each Topical Daily   dapagliflozin  propanediol  10 mg Oral Daily   digoxin   0.125 mg Oral Daily   feeding supplement  237 mL Oral BID BM   fluticasone  furoate-vilanterol  1 puff Inhalation Daily   And    umeclidinium bromide   1 puff Inhalation Daily   methocarbamol   500 mg Oral TID   metoprolol  succinate  50 mg Oral Daily   pantoprazole   40 mg Oral Daily   potassium chloride   40 mEq Oral Once   sodium chloride  flush  10-40 mL Intracatheter Q12H   spironolactone   25 mg Oral Daily   Continuous Infusions:  amiodarone  30 mg/hr (08/10/23 0740)   furosemide  (LASIX ) 200 mg in dextrose  5 % 100 mL (2 mg/mL) infusion 12 mg/hr (08/10/23 0740)   heparin  1,600 Units/hr (08/10/23 0740)   magnesium  sulfate bolus IVPB     milrinone  0.125 mcg/kg/min (08/10/23 0851)     LOS: 4 days    Time spent:    Sigurd Pac, MD [facility]   08/10/2023, 12:31 PM

## 2023-08-10 NOTE — TOC Initial Note (Signed)
 Transition of Care [facility]) - Initial/Assessment Note    Patient Details  Name: Autumn [name] MRN: [id number] Date of Birth: 04-Oct-1955  Transition of Care [facility] CM/SW Contact:    [name] Phone Number: [phone] 08/10/2023, 9:57 AM  Clinical Narrative:    HF CSW met with pt at bedside. Pt stated that she lives with multiple people in the home including adults and minor children. Pt stated that she has a history of HH services. Pt stated that she uses a cane and rollator at home. Pt inquired about a hospital bed being ordered. Pt stated that she does not have a scale at home. Pt stated that she seen her PCP after her last hospital visit. CSW explained that another hospital follow up appointment would be scheduled closer to dc. Pt agrees.   TOC will continue following.                Expected Discharge Plan: Home/Self Care Barriers to Discharge: Continued Medical Work up   Patient Goals and CMS Choice            Expected Discharge Plan and Services       Living arrangements for the past 2 months: Single Family Home                                      Prior Living Arrangements/Services Living arrangements for the past 2 months: Single Family Home Lives with:: Relatives, Minor Children Patient language and need for interpreter reviewed:: Yes Do you feel safe going back to the place where you live?: Yes      Need for Family Participation in Patient Care: No (Comment) Care giver support system in place?: Yes (comment)   Criminal Activity/Legal Involvement Pertinent to Current Situation/Hospitalization: No - Comment as needed  Activities of Daily Living   ADL Screening (condition at time of admission) Independently performs ADLs?: Yes (appropriate for developmental age) Is the patient deaf or have difficulty hearing?: No Does the patient have difficulty seeing, even when wearing glasses/contacts?: No Does the patient have difficulty  concentrating, remembering, or making decisions?: No  Permission Sought/Granted                  Emotional Assessment Appearance:: Appears older than stated age Attitude/Demeanor/Rapport: Engaged Affect (typically observed): Blunt Orientation: : Oriented to Self, Oriented to Place, Oriented to  Time, Oriented to Situation Alcohol / Substance Use: Not Applicable Psych Involvement: No (comment)  Admission diagnosis:  Hypokalemia [E87.6] Sciatica of left side [M54.32] Atrial fibrillation with RVR (HCC) [I48.91] Noncompliance w/medication treatment due to intermit use of medication [Z91.148] Acute on chronic congestive heart failure, unspecified heart failure type (HCC) [I50.9] Patient Active Problem List   Diagnosis Date Noted   Atrial fibrillation with RVR (HCC) 08/06/2023   Sciatica of left side 08/06/2023   Hypokalemia 08/06/2023   Acute exacerbation of CHF (congestive heart failure) (HCC) 05/31/2023   Acute on chronic congestive heart failure (HCC) 02/22/2023   Paroxysmal atrial fibrillation (HCC) 02/22/2023   Acute hypoxic respiratory failure (HCC) 02/17/2023   Acute on chronic systolic heart failure (HCC) 01/18/2023   COPD (chronic obstructive pulmonary disease) (HCC) 12/03/2022   Parkinson's disease (HCC) 12/03/2022   PAD (peripheral artery disease) (HCC) 12/03/2022   CAD (coronary artery disease) S/P stent to LAD 12/03/2022   Atrial fibrillation with rapid ventricular response (HCC) 01/11/2021   Tobacco dependence 01/11/2021  Homelessness 01/11/2021   Alcohol dependence with uncomplicated withdrawal (HCC) 06/27/2017   Polysubstance abuse (HCC) 06/27/2017   Depression 06/27/2017   Essential hypertension 06/27/2017   GERD (gastroesophageal reflux disease) 06/27/2017   Attention deficit disorder (ADD)    PCP:  Corrington, Coye LABOR, MD Pharmacy:   DARRYLE LAW - [facility] [address] [place] [address] Phone: [phone] Fax:  [phone]  [id number] [place], [place] - [address] AT [facility] [address] [facility] [address] [place] [address] Phone: [phone] Fax: [phone]  [id number] [place], [place] - [address] AT [facility] OF [facility] [address] & [facility] [address] [place] [address] Phone: [phone] Fax: [phone]     Social Drivers of Health (SDOH) Social History: SDOH Screenings   Food Insecurity: Patient Declined (08/06/2023)  Housing: Patient Declined (08/06/2023)  Transportation Needs: Patient Declined (08/06/2023)  Recent Concern: Transportation Needs - Unmet Transportation Needs (05/17/2023)   Received from [facility]  Utilities: Patient Declined (08/06/2023)  Alcohol Screen: Low Risk  (06/02/2023)  Financial Resource Strain: Low Risk  (06/02/2023)  Physical Activity: Unknown (05/17/2023)   Received from [facility]  Social Connections: Socially Integrated (05/17/2023)   Received from [facility]  Stress: No Stress Concern Present (05/17/2023)   Received from [facility]  Tobacco Use: High Risk (08/10/2023)   SDOH Interventions:     Readmission Risk Interventions    01/21/2023    1:29 PM 12/07/2022    1:27 PM  Readmission Risk Prevention Plan  Transportation Screening Complete Complete  PCP or Specialist Appt within 3-5 Days  Complete  HRI or Home Care Consult  Complete  Social Work Consult for Recovery Care Planning/Counseling  Complete  Palliative Care Screening  Not Applicable  Medication Review Oceanographer) Complete Complete  PCP or Specialist appointment within 3-5 days of discharge Complete   HRI or Home Care Consult Complete   SW Recovery Care/Counseling Consult Complete   Palliative Care Screening Not Applicable   Skilled Nursing Facility Not Applicable

## 2023-08-10 NOTE — Progress Notes (Signed)
 OT Cancellation Note  Patient Details Name: MAIZEE REINHOLD MRN: [id number] DOB: [birth year]   Cancelled Treatment:    Reason Eval/Treat Not Completed: Patient declined, no reason specified;Pain limiting ability to participate Pt declined OT session due to constipation pain. Will follow up at another time as schedule permits.  Jenevieve Kirschbaum  Britt 08/10/2023, 1:14 PM

## 2023-08-10 NOTE — Progress Notes (Signed)
 ANTICOAGULATION CONSULT NOTE   Pharmacy Consult for Heparin  Indication: atrial fibrillation  Allergies  Allergen Reactions   Pamelor [Nortriptyline Hcl] Anaphylaxis   Vibra-Tab [Doxycycline] Other (See Comments)    Unknown reaction    Patient Measurements: Height: 5' 4 (162.6 cm) Weight: 95.4 kg (210 lb 5.1 oz) IBW/kg (Calculated) : 54.7 Heparin  Dosing Weight: 77.4 kg  Vital Signs: Temp: 98.1 F (36.7 C) (12/31 0315) Temp Source: Oral (12/31 0315) BP: 103/86 (12/31 0315) Pulse Rate: 90 (12/31 0315)  Labs: Recent Labs    08/08/23 0735 08/08/23 0948 08/08/23 1548 08/09/23 0405 08/09/23 0845 08/09/23 2007 08/10/23 0350  HGB 12.9  --   --  12.8 13.3  12.9  --  13.6  HCT 40.3  --   --  40.0 39.0  38.0  --  41.9  PLT 294  --   --  273  --   --  302  APTT 122*  --    < > 61*  --  53* 89*  HEPARINUNFRC 0.86*  --   --  0.49  --   --  0.71*  CREATININE  --  1.19*  --  1.13*  --   --  1.10*   < > = values in this interval not displayed.    Estimated Creatinine Clearance: 56.4 mL/min (A) (by C-G formula based on SCr of 1.1 mg/dL (H)).   Medical History: Past Medical History:  Diagnosis Date   Anxiety    Arthritis    Attention deficit disorder (ADD)    COPD (chronic obstructive pulmonary disease) (HCC)    Depression    Drug abuse (HCC)    Fibromyalgia    HFrEF (heart failure with reduced ejection fraction) (HCC)    Hyperlipidemia    Paroxysmal Atrial fibrillation     Medications:  Medications Prior to Admission  Medication Sig Dispense Refill Last Dose/Taking   albuterol  (VENTOLIN  HFA) 108 (90 Base) MCG/ACT inhaler Inhale 2 puffs into the lungs every 4 (four) hours as needed for wheezing.   Past Week   ARIPiprazole  (ABILIFY ) 5 MG tablet Take 1 tablet (5 mg total) by mouth daily. 30 tablet 0 Past Week   aspirin  (ASPIRIN  CHILDRENS) 81 MG chewable tablet Chew 1 tablet (81 mg total) by mouth daily. 30 tablet 11 Past Week   atorvastatin  (LIPITOR ) 80 MG tablet Take  1 tablet (80 mg total) by mouth daily. 30 tablet 0 Past Week   Budeson-Glycopyrrol-Formoterol  (BREZTRI  AEROSPHERE) 160-9-4.8 MCG/ACT AERO Inhale 2 puffs into the lungs in the morning and at bedtime. 10.7 g 0 Past Week   dapagliflozin  propanediol (FARXIGA ) 10 MG TABS tablet Take 1 tablet (10 mg total) by mouth daily. 30 tablet 0 Past Week   digoxin  (LANOXIN ) 0.125 MG tablet Take 1 tablet (0.125 mg total) by mouth daily. 30 tablet 3 Past Week   fluticasone  (FLONASE ) 50 MCG/ACT nasal spray Place 2 sprays into both nostrils daily as needed for allergies.   Past Week   gabapentin  (NEURONTIN ) 300 MG capsule Take 2 capsules (600 mg total) by mouth 3 (three) times daily as needed (Takes 2-3 as needed). (Patient taking differently: Take 600 mg by mouth 3 (three) times daily as needed (for pain).) 90 capsule 0 Past Week   metoprolol  succinate (TOPROL -XL) 100 MG 24 hr tablet Take 1 tablet (100 mg total) by mouth daily. Take with or immediately following a meal. 30 tablet 0 Past Week   naloxone  (NARCAN ) nasal spray 4 mg/0.1 mL Place 4 mg into the nose  as needed (overdose, as needed).   Past Month   pantoprazole  (PROTONIX ) 40 MG tablet Take 1 tablet (40 mg total) by mouth daily. 30 tablet 0 Past Week   rivaroxaban  (XARELTO ) 20 MG TABS tablet Take 1 tablet (20 mg total) by mouth daily with supper. 30 tablet 0 Past Week   spironolactone  (ALDACTONE ) 25 MG tablet Take 0.5 tablets (12.5 mg total) by mouth daily. 45 tablet 3 Past Week   torsemide  (DEMADEX ) 20 MG tablet TAKE 60MG  (3 TABLETS) IN THE MORNING AND 40MG  (2 TABLETS) IN THE EVENING 180 tablet 1 Past Week   amiodarone  (PACERONE ) 200 MG tablet Take 2 tablets (400 mg total) by mouth 2 (two) times daily. (Patient not taking: Reported on 08/06/2023) 120 tablet 0 Not Taking   Budeson-Glycopyrrol-Formoterol  (BREZTRI  AEROSPHERE) 160-9-4.8 MCG/ACT AERO Inhale 2 puffs into the lungs 2 (two) times daily. (Patient not taking: Reported on 08/06/2023)   Not Taking    DULoxetine  (CYMBALTA ) 30 MG capsule Take 1 capsule (30 mg total) by mouth daily. (Patient not taking: Reported on 08/06/2023) 30 capsule 0 Not Taking   nicotine  (NICODERM CQ  - DOSED IN MG/24 HOURS) 14 mg/24hr patch Place 1 patch (14 mg total) onto the skin daily. (Patient not taking: Reported on 08/06/2023) 28 patch 0 Not Taking   Scheduled:   ARIPiprazole   5 mg Oral Daily   aspirin   81 mg Oral Daily   atorvastatin   80 mg Oral Daily   Chlorhexidine  Gluconate Cloth  6 each Topical Daily   dapagliflozin  propanediol  10 mg Oral Daily   digoxin   0.125 mg Oral Daily   feeding supplement  237 mL Oral BID BM   fluticasone  furoate-vilanterol  1 puff Inhalation Daily   And   umeclidinium bromide   1 puff Inhalation Daily   methocarbamol   500 mg Oral TID   metoprolol  succinate  100 mg Oral Daily   pantoprazole   40 mg Oral Daily   sodium chloride  flush  10-40 mL Intracatheter Q12H   sodium chloride  flush  3 mL Intravenous Q12H   spironolactone   25 mg Oral Daily   Infusions:   sodium chloride      amiodarone  30 mg/hr (08/10/23 0341)   furosemide  (LASIX ) 200 mg in dextrose  5 % 100 mL (2 mg/mL) infusion 12 mg/hr (08/10/23 0403)   heparin  1,600 Units/hr (08/10/23 0501)   magnesium  sulfate bolus IVPB     milrinone  0.25 mcg/kg/min (08/09/23 2318)   PRN: sodium chloride , acetaminophen , fluticasone , gabapentin , levalbuterol , nicotine , ondansetron  **OR** ondansetron  (ZOFRAN ) IV, ondansetron  (ZOFRAN ) IV, oxyCODONE -acetaminophen , senna-docusate, sodium chloride  flush, sodium chloride  flush  Assessment: 39 yof with a history of HF, CAD s/p prior DES to LAD, AF, mitral regurgitation, COPD. Patient is presenting with left-sided sciatic pain and AF. Heparin  per pharmacy consult placed for atrial fibrillation.  Patient is on Xarelto  prior to arrival. Last dose is unknown. Outpatient . Will require aPTT monitoring due to likely falsely high anti-Xa level secondary to DOAC use.  12/31 AM update:  -aPTT  therapeutic after rate increase  Goal of Therapy:  Heparin  level 0.3-0.7 units/ml aPTT 66-102 seconds Monitor platelets by anticoagulation protocol: Yes   Plan:  Continue heparin  infusion at 1600 units/hr Check aPTT & anti-Xa level in 8 hours Continue to monitor via aPTT until levels are correlated Continue to monitor H&H and platelets  Lynwood Mckusick, PharmD, BCPS Clinical Pharmacist Phone: [phone]

## 2023-08-10 NOTE — Progress Notes (Signed)
 Echocardiogram Echocardiogram Transesophageal has been performed.  [name] 08/10/2023, 8:21 AM

## 2023-08-11 DIAGNOSIS — I4891 Unspecified atrial fibrillation: Secondary | ICD-10-CM | POA: Diagnosis not present

## 2023-08-11 DIAGNOSIS — I5023 Acute on chronic systolic (congestive) heart failure: Secondary | ICD-10-CM | POA: Diagnosis not present

## 2023-08-11 LAB — BASIC METABOLIC PANEL
Anion gap: 12 (ref 5–15)
BUN: 19 mg/dL (ref 8–23)
CO2: 30 mmol/L (ref 22–32)
Calcium: 9.7 mg/dL (ref 8.9–10.3)
Chloride: 95 mmol/L — ABNORMAL LOW (ref 98–111)
Creatinine, Ser: 1.33 mg/dL — ABNORMAL HIGH (ref 0.44–1.00)
GFR, Estimated: 44 mL/min — ABNORMAL LOW (ref >60)
Glucose, Bld: 132 mg/dL — ABNORMAL HIGH (ref 70–99)
Potassium: 4.2 mmol/L (ref 3.5–5.1)
Sodium: 137 mmol/L (ref 135–145)

## 2023-08-11 LAB — MAGNESIUM: Magnesium: 2 mg/dL (ref 1.7–2.4)

## 2023-08-11 LAB — COOXEMETRY PANEL
Carboxyhemoglobin: 1.7 % — ABNORMAL HIGH (ref 0.5–1.5)
Methemoglobin: 0.8 % (ref 0.0–1.5)
O2 Saturation: 67.2 %
Total hemoglobin: 14 g/dL (ref 12.0–16.0)

## 2023-08-11 LAB — CBC
HCT: 41.6 % (ref 36.0–46.0)
Hemoglobin: 13.6 g/dL (ref 12.0–15.0)
MCH: 27.3 pg (ref 26.0–34.0)
MCHC: 32.7 g/dL (ref 30.0–36.0)
MCV: 83.4 fL (ref 80.0–100.0)
Platelets: 288 10*3/uL (ref 150–400)
RBC: 4.99 MIL/uL (ref 3.87–5.11)
RDW: 15.9 % — ABNORMAL HIGH (ref 11.5–15.5)
WBC: 13.3 10*3/uL — ABNORMAL HIGH (ref 4.0–10.5)
nRBC: 0 % (ref 0.0–0.2)

## 2023-08-11 LAB — APTT: aPTT: 100 s — ABNORMAL HIGH (ref 24–36)

## 2023-08-11 LAB — HEPARIN LEVEL (UNFRACTIONATED): Heparin Unfractionated: 0.88 [IU]/mL — ABNORMAL HIGH (ref 0.30–0.70)

## 2023-08-11 MED ORDER — SACUBITRIL-VALSARTAN 24-26 MG PO TABS
1.0000 | ORAL_TABLET | Freq: Two times a day (BID) | ORAL | Status: DC
  Administered 2023-08-11 – 2023-08-12 (×3): 1 via ORAL
  Filled 2023-08-11 (×3): qty 1

## 2023-08-11 MED ORDER — AMIODARONE HCL 200 MG PO TABS
200.0000 mg | ORAL_TABLET | Freq: Two times a day (BID) | ORAL | Status: DC
Start: 2023-08-11 — End: 2023-08-13
  Administered 2023-08-11 – 2023-08-12 (×3): 200 mg via ORAL
  Filled 2023-08-11 (×3): qty 1

## 2023-08-11 MED ORDER — RIVAROXABAN 20 MG PO TABS
20.0000 mg | ORAL_TABLET | Freq: Every day | ORAL | Status: DC
  Administered 2023-08-11 – 2023-08-12 (×2): 20 mg via ORAL
  Filled 2023-08-11 (×2): qty 1

## 2023-08-11 NOTE — Progress Notes (Signed)
 Physical Therapy Treatment Patient Details Name: Autumn [name] MRN: [id number] DOB: October 29, 1955 Today's Date: 08/11/2023   History of Present Illness Pt is a 68 y.o. female who presented 08/06/23 as a referral from her cardiologist due to sciatic lower back/left leg pain and Afib with RVR. Found to be volume overloaded. Admitted with acute on chronic systolic CHF. XR demonstrates progressive degenerative disc disease involving L3-L4-L5 levels. PMH of afib, substance use disorder, parkinson's disease, COPD, fibromyalgia, HLD, and systolic heart failure.    PT Comments  Pt with improving mobility and activity tolerance. Less sciatic pain today. Should be ready for dc from PT standpoint when medically ready.     If plan is discharge home, recommend the following: A little help with bathing/dressing/bathroom;Assistance with cooking/housework;Assist for transportation;Help with stairs or ramp for entrance   Can travel by private vehicle        Equipment Recommendations  Hospital bed (per pt request)    Recommendations for Other Services       Precautions / Restrictions Precautions Precautions: Fall Precaution Comments: watch SpO2 Restrictions Weight Bearing Restrictions Per Provider Order: No     Mobility  Bed Mobility               General bed mobility comments: Pt sitting EOB    Transfers Overall transfer level: Needs assistance Equipment used: Rollator (4 wheels) Transfers: Sit to/from Stand Sit to Stand: Supervision                Ambulation/Gait Ambulation/Gait assistance: Supervision Gait Distance (Feet): 200 Feet Assistive device: Rollator (4 wheels) Gait Pattern/deviations: Step-through pattern, Decreased stride length, Trunk flexed Gait velocity: decr Gait velocity interpretation: 1.31 - 2.62 ft/sec, indicative of limited community ambulator   General Gait Details: Supervision for safety. No loss of balance or instability.   Stairs              Wheelchair Mobility     Tilt Bed    Modified Rankin (Stroke Patients Only)       Balance Overall balance assessment: Needs assistance Sitting-balance support: No upper extremity supported, Feet supported Sitting balance-Leahy Scale: Fair     Standing balance support: During functional activity, No upper extremity supported Standing balance-Leahy Scale: Fair Standing balance comment: UE support                            Cognition Arousal: Alert Behavior During Therapy: WFL for tasks assessed/performed                             Safety/Judgement: Decreased awareness of safety     General Comments: Likely baseline        Exercises      General Comments General comments (skin integrity, edema, etc.): SpO2 97% at rest on RA and 95% after amb on RA      Pertinent Vitals/Pain Pain Assessment Pain Assessment: 0-10 Pain Score: 4  Pain Location: L leg Pain Descriptors / Indicators: Discomfort, Aching, Shooting    Home Living                          Prior Function            PT Goals (current goals can now be found in the care plan section) Acute Rehab PT Goals Patient Stated Goal: to reduce pain Progress towards PT goals: Progressing toward goals  Frequency    Min 1X/week      PT Plan      Co-evaluation              AM-PAC PT 6 Clicks Mobility   Outcome Measure  Help needed turning from your back to your side while in a flat bed without using bedrails?: A Little Help needed moving from lying on your back to sitting on the side of a flat bed without using bedrails?: A Little Help needed moving to and from a bed to a chair (including a wheelchair)?: A Little Help needed standing up from a chair using your arms (e.g., wheelchair or bedside chair)?: A Little Help needed to walk in hospital room?: A Little Help needed climbing 3-5 steps with a railing? : A Little 6 Click Score: 18    End of Session  Equipment Utilized During Treatment: Gait belt Activity Tolerance: Patient limited by pain Patient left: in chair;with call bell/phone within reach;with chair alarm set Nurse Communication: Mobility status PT Visit Diagnosis: Unsteadiness on feet (R26.81);Other abnormalities of gait and mobility (R26.89);Difficulty in walking, not elsewhere classified (R26.2);Pain Pain - Right/Left: Left Pain - part of body: Leg     Time: 9249-9184 PT Time Calculation (min) (ACUTE ONLY): 25 min  Charges:    $Gait Training: 23-37 mins PT General Charges $$ ACUTE PT VISIT: 1 Visit                     [facility] PT Acute Rehabilitation Services Office [phone]    [place] ORN [facility] 08/11/2023, 9:27 AM

## 2023-08-11 NOTE — Progress Notes (Signed)
 Patient ID: Autumn [name], female   DOB: 01/22/1956, 68 y.o.   MRN: [id number]     Advanced Heart Failure Rounding Note  Cardiologist: Dub Huntsman, DO   Subjective:   Chief Complaint: Shortness of breath  I/Os not accurate, weight down 2 lbs on Lasix  gtt 12 mg/hr.  She is on milrinone  0.125 with co-ox 67%.  CVP 4 on my read this morning. She remains in NSR on amiodarone  gtt.   Breathing is much improved.    TEE 12/31 with EF 25-30%, moderate RV dysfunction, moderate MR.  Patient had DCCV back to NSR.   RHC/LHC: Coronary Findings  Diagnostic Dominance: Right Left Anterior Descending  Mid LAD lesion is 20% stenosed. The lesion was previously treated .    Second Diagonal Branch  2nd Diag lesion is 80% stenosed.    Left Circumflex  Mid Cx lesion is 75% stenosed.    Right Coronary Artery  Prox RCA lesion is 60% stenosed. The lesion is calcified.    Intervention   No interventions have been documented.   Right Heart  Right Heart Pressures RHC Procedural Findings: Hemodynamics (mmHg) RA mean 18 RV 52/14 PA 57/32 mean 43 PCWP mean 28 LV 141/28 AO 139/86  Oxygen  saturations: PA 55% AO 100%  Cardiac Output (Fick) 3.41  Cardiac Index (Fick) 1.7 PVR 4.4 WU  PAPi 1.4     Objective:   Weight Range: 94.7 kg Body mass index is 35.84 kg/m.   Vital Signs:   Temp:  [97.2 F (36.2 C)-100.3 F (37.9 C)] 100.3 F (37.9 C) (01/01 0821) Pulse Rate:  [59-79] 72 (01/01 0431) Resp:  [18-20] 20 (01/01 0821) BP: (94-125)/(51-98) 118/95 (01/01 0821) SpO2:  [89 %-98 %] 92 % (01/01 0821) Weight:  [94.7 kg] 94.7 kg (01/01 0431) Last BM Date : 08/10/23  Weight change: Filed Weights   08/09/23 0353 08/10/23 0315 08/11/23 0431  Weight: 96.8 kg 95.4 kg 94.7 kg    Intake/Output:   Intake/Output Summary (Last 24 hours) at 08/11/2023 1226 Last data filed at 08/11/2023 0836 Gross per 24 hour  Intake 1470.68 ml  Output 1400 ml  Net 70.68 ml      Physical Exam     General: NAD Neck: No JVD, no thyromegaly or thyroid nodule.  Lungs: Clear to auscultation bilaterally with normal respiratory effort. CV: Nondisplaced PMI.  Heart regular S1/S2, no S3/S4, no murmur.  No peripheral edema.  Abdomen: Soft, nontender, no hepatosplenomegaly, no distention.  Skin: Intact without lesions or rashes.  Neurologic: Alert and oriented x 3.  Psych: Normal affect. Extremities: No clubbing or cyanosis.  HEENT: Normal.    Telemetry   NSR 70s (personally reviewed)  Labs    CBC Recent Labs    08/10/23 0350 08/11/23 0445  WBC 9.0 13.3*  HGB 13.6 13.6  HCT 41.9 41.6  MCV 84.6 83.4  PLT 302 288   Basic Metabolic Panel Recent Labs    87/69/75 0405 08/09/23 0845 08/10/23 0350 08/11/23 0445  NA 137   < > 135 137  K 4.8   < > 4.0 4.2  CL 99  --  94* 95*  CO2 26  --  30 30  GLUCOSE 116*  --  113* 132*  BUN 20  --  20 19  CREATININE 1.13*  --  1.10* 1.33*  CALCIUM  9.3  --  9.3 9.7  MG 1.7  --   --  2.0   < > = values in this interval not displayed.  Liver Function Tests No results for input(s): AST, ALT, ALKPHOS, BILITOT, PROT, ALBUMIN in the last 72 hours. No results for input(s): LIPASE, AMYLASE in the last 72 hours. Cardiac Enzymes No results for input(s): CKTOTAL, CKMB, CKMBINDEX, TROPONINI in the last 72 hours.  BNP: BNP (last 3 results) Recent Labs    05/31/23 0712 07/02/23 1153 08/06/23 1802  BNP 606.9* 411.5* 238.2*    ProBNP (last 3 results) No results for input(s): PROBNP in the last 8760 hours.   D-Dimer No results for input(s): DDIMER in the last 72 hours. Hemoglobin A1C No results for input(s): HGBA1C in the last 72 hours.  Fasting Lipid Panel No results for input(s): CHOL, HDL, LDLCALC, TRIG, CHOLHDL, LDLDIRECT in the last 72 hours. Thyroid Function Tests No results for input(s): TSH, T4TOTAL, T3FREE, THYROIDAB in the last 72 hours.  Invalid input(s):  FREET3   Other results:   Imaging    No results found.     Medications:     Scheduled Medications:  amiodarone   200 mg Oral BID   ARIPiprazole   5 mg Oral Daily   atorvastatin   80 mg Oral Daily   Chlorhexidine  Gluconate Cloth  6 each Topical Daily   dapagliflozin  propanediol  10 mg Oral Daily   digoxin   0.125 mg Oral Daily   feeding supplement  237 mL Oral BID BM   fluticasone  furoate-vilanterol  1 puff Inhalation Daily   And   umeclidinium bromide   1 puff Inhalation Daily   methocarbamol   500 mg Oral TID   metoprolol  succinate  50 mg Oral Daily   pantoprazole   40 mg Oral Daily   polyethylene glycol  17 g Oral Daily   sacubitril -valsartan   1 tablet Oral BID   senna-docusate  1 tablet Oral BID   sodium chloride  flush  10-40 mL Intracatheter Q12H   spironolactone   25 mg Oral Daily    Infusions:  magnesium  sulfate bolus IVPB     milrinone  0.125 mcg/kg/min (08/10/23 2000)    PRN Medications: acetaminophen , fluticasone , gabapentin , levalbuterol , nicotine , ondansetron  **OR** ondansetron  (ZOFRAN ) IV, ondansetron  (ZOFRAN ) IV, oxyCODONE -acetaminophen , sodium chloride  flush   Assessment/Plan   1. Acute on chronic systolic CHF: Echo in 6/22 with EF 60-65%.  Has had multiple echoes in 2024, each with lower EF.  Most recent echo in 10/24 with EF 30-35%, moderate-severe MR.  She has a history of CAD but denies recent chest pain.  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  Cath 12/30 showed significant CAD but no critical lesion.  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  She was admitted with volume overload and dyspnea.  RHC 12/30 with elevated right and left heart filling pressures, low PAPi and low CI at 1.7. TEE 12/31 with EF 25-30%, moderate RV dysfunction, moderate MR.  Co-ox 67% today, on milrinone  0.125.  CVP 4 today, good diuresis with Lasix  gtt.  - Stop milrinone .  - Stop Lasix  gtt, start po diuretic tomorrow.     - Continue Farxiga  10 daily - Continue spironolactone  25 daily - Continue digoxin  0.125 daily - Continue Toprol  XL 50 mg daily (decreased with low output on RHC).  - Can add Entresto  24/26 bid today.   2. Atrial fibrillation: Persistent, multiple admissions with AF/RVR.  Says she has never been cardioverted.  On Xarelto  at home, heparin  gtt currently. DCCV to NSR on 12/31. NSR today.  - Can transition to po amiodarone  after stopping milrinone .   - Continue Toprol  XL and digoxin .  -  Transition from heparin  gtt back to Xarelto .   - Would favor eventual AF ablation.  3. 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 Moderate on TEE 12/31.  4. CAD: DES to LAD in 9/20.  No recent chest pain. LHC 12/30 showed patent LAD stent, 75% mid LCx stenosis and heavily calcified 60% proximal RCA stenosis.  - Manage CAD medically given no critical lesion and no chest pain.  - Continue atorvastatin .  - With Xarelto  use, will stop ASA.  5. COPD: She smokes and uses home oxygen  at times.  - Needs to quit smoking.  6. Sciatica: Significant pain.  Per [facility].  7. OSA: Suspected.  - Will need sleep study.  8. PFO: Small, noted on TEE.   Mobilize.  Length of Stay: 5  Ezra Shuck, MD  08/11/2023, 12:26 PM  Advanced Heart Failure Team Pager [phone] (M-F; 7a - 5p)  Please contact [facility] for night-coverage after hours (5p -7a ) and weekends on [email or web address]

## 2023-08-11 NOTE — Progress Notes (Addendum)
 ANTICOAGULATION CONSULT NOTE   Pharmacy Consult for Heparin  Indication: atrial fibrillation  Allergies  Allergen Reactions   Pamelor [Nortriptyline Hcl] Anaphylaxis   Vibra-Tab [Doxycycline] Other (See Comments)    Unknown reaction    Patient Measurements: Height: 5' 4 (162.6 cm) Weight: 94.7 kg (208 lb 12.4 oz) IBW/kg (Calculated) : 54.7 Heparin  Dosing Weight: 77.4 kg  Vital Signs: Temp: 100.3 F (37.9 C) (01/01 0821) Temp Source: Oral (01/01 0821) BP: 118/95 (01/01 0821) Pulse Rate: 72 (01/01 0431)  Labs: Recent Labs    08/09/23 0405 08/09/23 0845 08/09/23 2007 08/10/23 0350 08/10/23 1148 08/11/23 0445  HGB 12.8 13.3  12.9  --  13.6  --  13.6  HCT 40.0 39.0  38.0  --  41.9  --  41.6  PLT 273  --   --  302  --  288  APTT 61*  --    < > 89* 72* 100*  HEPARINUNFRC 0.49  --   --  0.71*  --  0.88*  CREATININE 1.13*  --   --  1.10*  --  1.33*   < > = values in this interval not displayed.    Estimated Creatinine Clearance: 46.4 mL/min (A) (by C-G formula based on SCr of 1.33 mg/dL (H)).   Medical History: Past Medical History:  Diagnosis Date   Anxiety    Arthritis    Attention deficit disorder (ADD)    COPD (chronic obstructive pulmonary disease) (HCC)    Depression    Drug abuse (HCC)    Fibromyalgia    HFrEF (heart failure with reduced ejection fraction) (HCC)    Hyperlipidemia    Paroxysmal Atrial fibrillation     Medications:  Medications Prior to Admission  Medication Sig Dispense Refill Last Dose/Taking   albuterol  (VENTOLIN  HFA) 108 (90 Base) MCG/ACT inhaler Inhale 2 puffs into the lungs every 4 (four) hours as needed for wheezing.   Past Week   ARIPiprazole  (ABILIFY ) 5 MG tablet Take 1 tablet (5 mg total) by mouth daily. 30 tablet 0 Past Week   aspirin  (ASPIRIN  CHILDRENS) 81 MG chewable tablet Chew 1 tablet (81 mg total) by mouth daily. 30 tablet 11 Past Week   atorvastatin  (LIPITOR ) 80 MG tablet Take 1 tablet (80 mg total) by mouth daily. 30  tablet 0 Past Week   Budeson-Glycopyrrol-Formoterol  (BREZTRI  AEROSPHERE) 160-9-4.8 MCG/ACT AERO Inhale 2 puffs into the lungs in the morning and at bedtime. 10.7 g 0 Past Week   dapagliflozin  propanediol (FARXIGA ) 10 MG TABS tablet Take 1 tablet (10 mg total) by mouth daily. 30 tablet 0 Past Week   digoxin  (LANOXIN ) 0.125 MG tablet Take 1 tablet (0.125 mg total) by mouth daily. 30 tablet 3 Past Week   fluticasone  (FLONASE ) 50 MCG/ACT nasal spray Place 2 sprays into both nostrils daily as needed for allergies.   Past Week   gabapentin  (NEURONTIN ) 300 MG capsule Take 2 capsules (600 mg total) by mouth 3 (three) times daily as needed (Takes 2-3 as needed). (Patient taking differently: Take 600 mg by mouth 3 (three) times daily as needed (for pain).) 90 capsule 0 Past Week   metoprolol  succinate (TOPROL -XL) 100 MG 24 hr tablet Take 1 tablet (100 mg total) by mouth daily. Take with or immediately following a meal. 30 tablet 0 Past Week   naloxone  (NARCAN ) nasal spray 4 mg/0.1 mL Place 4 mg into the nose as needed (overdose, as needed).   Past Month   pantoprazole  (PROTONIX ) 40 MG tablet Take 1 tablet (40  mg total) by mouth daily. 30 tablet 0 Past Week   rivaroxaban  (XARELTO ) 20 MG TABS tablet Take 1 tablet (20 mg total) by mouth daily with supper. 30 tablet 0 Past Week   spironolactone  (ALDACTONE ) 25 MG tablet Take 0.5 tablets (12.5 mg total) by mouth daily. 45 tablet 3 Past Week   torsemide  (DEMADEX ) 20 MG tablet TAKE 60MG  (3 TABLETS) IN THE MORNING AND 40MG  (2 TABLETS) IN THE EVENING 180 tablet 1 Past Week   amiodarone  (PACERONE ) 200 MG tablet Take 2 tablets (400 mg total) by mouth 2 (two) times daily. (Patient not taking: Reported on 08/06/2023) 120 tablet 0 Not Taking   Budeson-Glycopyrrol-Formoterol  (BREZTRI  AEROSPHERE) 160-9-4.8 MCG/ACT AERO Inhale 2 puffs into the lungs 2 (two) times daily. (Patient not taking: Reported on 08/06/2023)   Not Taking   DULoxetine  (CYMBALTA ) 30 MG capsule Take 1 capsule  (30 mg total) by mouth daily. (Patient not taking: Reported on 08/06/2023) 30 capsule 0 Not Taking   nicotine  (NICODERM CQ  - DOSED IN MG/24 HOURS) 14 mg/24hr patch Place 1 patch (14 mg total) onto the skin daily. (Patient not taking: Reported on 08/06/2023) 28 patch 0 Not Taking   Scheduled:   ARIPiprazole   5 mg Oral Daily   aspirin   81 mg Oral Daily   atorvastatin   80 mg Oral Daily   Chlorhexidine  Gluconate Cloth  6 each Topical Daily   dapagliflozin  propanediol  10 mg Oral Daily   digoxin   0.125 mg Oral Daily   feeding supplement  237 mL Oral BID BM   fluticasone  furoate-vilanterol  1 puff Inhalation Daily   And   umeclidinium bromide   1 puff Inhalation Daily   methocarbamol   500 mg Oral TID   metoprolol  succinate  50 mg Oral Daily   pantoprazole   40 mg Oral Daily   polyethylene glycol  17 g Oral Daily   senna-docusate  1 tablet Oral BID   sodium chloride  flush  10-40 mL Intracatheter Q12H   spironolactone   25 mg Oral Daily   Infusions:   amiodarone  30 mg/hr (08/11/23 0426)   furosemide  (LASIX ) 200 mg in dextrose  5 % 100 mL (2 mg/mL) infusion 12 mg/hr (08/10/23 2000)   heparin  1,600 Units/hr (08/10/23 2149)   magnesium  sulfate bolus IVPB     milrinone  0.125 mcg/kg/min (08/10/23 2000)   PRN: acetaminophen , fluticasone , gabapentin , levalbuterol , nicotine , ondansetron  **OR** ondansetron  (ZOFRAN ) IV, ondansetron  (ZOFRAN ) IV, oxyCODONE -acetaminophen , sodium chloride  flush  Assessment: 47 yof with a history of HF, CAD s/p prior DES to LAD, AF, mitral regurgitation, COPD. Patient is presenting with left-sided sciatic pain and AF. Heparin  per pharmacy consult placed for atrial fibrillation. Patient is on Xarelto  prior to arrival. Last dose is unknown and pt is noncompliant as outpatient.  This morning, heparin  level supratherapeutic at 0.88 and aPTT is on higher end of therapeutic at 100 on heparin  infusion at 1600 units/hr. Unclear if heparin  level and aPTT are correlating, but both  levels are starting to trend upwards, warranting a change to the infusion rate. CBC stable. No issues with infusion and no bleeding noted per RN.  Goal of Therapy:  Heparin  level 0.3-0.7 units/ml aPTT 66-102 seconds Monitor platelets by anticoagulation protocol: Yes   Plan:  Decrease heparin  to 1450 units/h Obtain aPTT and heparin  level in 8 hours Daily heparin  level, aPTT, CBC Follow-up transition back to Xarelto   Josefa Range, PharmD PGY1 Pharmacy Resident 08/11/2023 8:25 AM    -------------------------------------------- Addendum 1/1 @ 12:35 Consult was placed to transition from heparin   to Xarelto . Patient was on Xarelto  PTA however, was not compliant.   Plan: Stop heparin  infusion Start Xarelto  20 mg daily (CrCl 62 mL/min using actual body weight)  Monitor CBC and for any s/sx of bleeding  Josefa Range, PharmD PGY1 Pharmacy Resident 08/11/2023 12:37 PM

## 2023-08-11 NOTE — Progress Notes (Signed)
 Pt is confused at time, situations and place, more combative, not cooperative, verbally aggressive toward staff. We redirect her about safety precautions. She does not use a call bell  when frequently gets out of bed and no awareness of her PICC lines. Pt is stable hemodynamically. Continue high fall risk protocol. We will monitor.   Wendi Dash, RN

## 2023-08-11 NOTE — Plan of Care (Signed)
  Problem: Education: Goal: Knowledge of General Education information will improve Description: Including pain rating scale, medication(s)/side effects and non-pharmacologic comfort measures Outcome: Progressing   Problem: Health Behavior/Discharge Planning: Goal: Ability to manage health-related needs will improve Outcome: Progressing   Problem: Clinical Measurements: Goal: Ability to maintain clinical measurements within normal limits will improve Outcome: Progressing Goal: Will remain free from infection Outcome: Progressing Goal: Diagnostic test results will improve Outcome: Progressing Goal: Respiratory complications will improve Outcome: Progressing Goal: Cardiovascular complication will be avoided Outcome: Progressing   Problem: Activity: Goal: Risk for activity intolerance will decrease Outcome: Progressing   Problem: Nutrition: Goal: Adequate nutrition will be maintained Outcome: Progressing   Problem: Coping: Goal: Level of anxiety will decrease Outcome: Progressing   Problem: Elimination: Goal: Will not experience complications related to bowel motility Outcome: Progressing Goal: Will not experience complications related to urinary retention Outcome: Progressing   Problem: Pain Management: Goal: General experience of comfort will improve Outcome: Progressing   Problem: Safety: Goal: Ability to remain free from injury will improve Outcome: Progressing   Problem: Skin Integrity: Goal: Risk for impaired skin integrity will decrease Outcome: Progressing   Problem: Education: Goal: Knowledge of disease or condition will improve Outcome: Progressing Goal: Understanding of medication regimen will improve Outcome: Progressing Goal: Individualized Educational Video(s) Outcome: Progressing   Problem: Activity: Goal: Ability to tolerate increased activity will improve Outcome: Progressing   Problem: Cardiac: Goal: Ability to achieve and maintain  adequate cardiopulmonary perfusion will improve Outcome: Progressing   Problem: Health Behavior/Discharge Planning: Goal: Ability to safely manage health-related needs after discharge will improve Outcome: Progressing   Problem: Education: Goal: Understanding of CV disease, CV risk reduction, and recovery process will improve Outcome: Progressing Goal: Individualized Educational Video(s) Outcome: Progressing   Problem: Activity: Goal: Ability to return to baseline activity level will improve Outcome: Progressing   Problem: Cardiovascular: Goal: Ability to achieve and maintain adequate cardiovascular perfusion will improve Outcome: Progressing Goal: Vascular access site(s) Level 0-1 will be maintained Outcome: Progressing   Problem: Health Behavior/Discharge Planning: Goal: Ability to safely manage health-related needs after discharge will improve Outcome: Progressing   Problem: Education: Goal: Understanding of CV disease, CV risk reduction, and recovery process will improve Outcome: Progressing Goal: Individualized Educational Video(s) Outcome: Progressing   Problem: Activity: Goal: Ability to return to baseline activity level will improve Outcome: Progressing   Problem: Cardiovascular: Goal: Ability to achieve and maintain adequate cardiovascular perfusion will improve Outcome: Progressing Goal: Vascular access site(s) Level 0-1 will be maintained Outcome: Progressing   Problem: Health Behavior/Discharge Planning: Goal: Ability to safely manage health-related needs after discharge will improve Outcome: Progressing

## 2023-08-11 NOTE — Discharge Instructions (Signed)

## 2023-08-11 NOTE — Progress Notes (Signed)
 Plan of care is reviewed. Pt has been progressing. She is alert and oriented x 3, cooperative, afebrile, stable hemodynamically, sinus rhythm on the monitor, HR 60s-70s. CVP 6-8 mmHg.   She has mild SOB with exertions, with ambulating in her room, bed to bedside commode. She is on O2 NCL 2 LPM as needed. SPO2 94-98% on room air. Lungs clear, no productive cough.     On continue drips-Milrinone  0.125 mcg/kg/min, lasix  12 mg/hr,  Amiodarone  30 mg/hr, Heparin  16 units/hr.    She has a complaint of generalized pain.  She is able to sleep well overnight after Gabapentin  600 mg PRN given. No acute distress noted. We will continue to monitor.  Wendi Dash, RN

## 2023-08-11 NOTE — Progress Notes (Addendum)
 PROGRESS NOTE    Autumn [name]  FMW:[id number] DOB: Jun 15, 1956 DOA: 08/06/2023 PCP: Bobbette Coye LABOR, MD  67/F w chronic systolic CHF, AFib, CAD, 2L O2-dependent COPD, anxiety/depression/ADHD, fibromyalgia, HLD, OA who presented to the ED on 08/06/2023 from her cardiologist office for evaluation of sciatic lower back/left leg pain and AFib with RVR. She was found to be volume overloaded, so IV lasix , amiodarone  and heparin  infusions were started, and the patient admitted with cardiology consultation. XR demonstrates progressive degenerative disc disease involving L3-L4-L5 levels.   -RHC/LHC 12/30 with moderate to severe nonobstructive CAD, no critical disease, elevated filling pressures, wedge 28, cardiac index 1.7 -12/31 with DC cardioversion  Subjective: -Overnight was a bit restless, now resolved, breathing improving, denies urinary symptoms  Assessment and Plan:  A-fib with RVR: -Cards following, on Toprol , digoxin , amiodarone  and heparin  gtt. -Cardioverted 12/31, in sinus rhythm -? Change Amio to p.o.and DOAC   Acute on chronic systolic CHF  Moderate to severe mitral regurgitation -TTE in 05/2023 showed EF 30-35%, global HK, severe LAE, mod-severe MR. -CHF team following, on IV Lasix , Farxiga , Aldactone , Toprol , digoxin  -cath with significant CAD but no critical lesion, RHC with elevated filling pressures, low cardiac index -Started milrinone  12/30, 6.9 L negative, weight down 10 lb -?  Wean off milrinone  -Increase activity, discharge planning   Advanced lumbar DDD, widespread OA, fibromyalgia: Known to have severe arthritis, has been seen by orthopedics in [facility] previously.  -Continue gabapentin , oxycodone , Robaxin  -PT OT - Will need orthopedic/spine surgery follow up for this chronic progressive condition   Hypokalemia:  -Supplemented, improved  New diabetes mellitus, hyperglycemia -HbA1c is 6.7 consistent with new diabetes, continue Farxiga   CAD: s/p DES to LAD  in 2020.  - Continue ASA, BB, statin.  -Moderate noncritical CAD on LHC   Chronic hypoxic respiratory failure, COPD: - Quiescent - Continue home 2L O2.  - Continue breztri  and prn xopenex  -Looking cessation counseled   OSA: Not on CPAP therapy. -Outpatient follow-up with pulmonology   Anxiety, depression: Not on home medications.  - Continue aripiprazole , consider prn   Morbid obesity: Body mass index is 37.24 kg/m.    Tobacco use:  - Nicotine  patch prn   GERD:  - PPI   DVT prophylaxis: IV heparin  Code Status: Full code Family Communication: None present Disposition Plan: Home likely 48 hours  Consultants:    Procedures:   Antimicrobials:    Objective: Vitals:   08/11/23 0300 08/11/23 0431 08/11/23 0500 08/11/23 0821  BP:  (!) 119/98  (!) 118/95  Pulse:  72    Resp:  20  20  Temp:   (!) 97.2 F (36.2 C) 100.3 F (37.9 C)  TempSrc:   Oral Oral  SpO2: (!) 89% 94%  92%  Weight:  94.7 kg    Height:        Intake/Output Summary (Last 24 hours) at 08/11/2023 1116 Last data filed at 08/11/2023 0836 Gross per 24 hour  Intake 1710.68 ml  Output 2000 ml  Net -289.32 ml   Filed Weights   08/09/23 0353 08/10/23 0315 08/11/23 0431  Weight: 96.8 kg 95.4 kg 94.7 kg    Examination:  General exam: Obese chronically ill female sitting up in bed, AAOx3 HEENT: Positive JVD CVS: S1-S2, irregular rhythm Lungs: Clear bilaterally Extremities: Trace edema  Skin: No rashes Psychiatry:  Mood & affect appropriate.     Data Reviewed:   CBC: Recent Labs  Lab 08/06/23 1218 08/08/23 0735 08/09/23 0405 08/09/23 0845 08/10/23  0350 08/11/23 0445  WBC 7.3 6.3 7.4  --  9.0 13.3*  HGB 13.2 12.9 12.8 13.3  12.9 13.6 13.6  HCT 40.4 40.3 40.0 39.0  38.0 41.9 41.6  MCV 83.3 86.3 84.9  --  84.6 83.4  PLT 287 294 273  --  302 288   Basic Metabolic Panel: Recent Labs  Lab 08/07/23 0543 08/07/23 0850 08/08/23 0948 08/09/23 0405 08/09/23 0845 08/10/23 0350  08/11/23 0445  NA  --  133* 135 137 133*  130* 135 137  K  --  2.9* 4.2 4.8 4.3  4.2 4.0 4.2  CL  --  94* 98 99  --  94* 95*  CO2  --  27 26 26   --  30 30  GLUCOSE  --  256* 138* 116*  --  113* 132*  BUN  --  12 17 20   --  20 19  CREATININE  --  0.95 1.19* 1.13*  --  1.10* 1.33*  CALCIUM   --  9.0 9.0 9.3  --  9.3 9.7  MG 2.0  --   --  1.7  --   --  2.0  PHOS 4.9*  --   --   --   --   --   --    GFR: Estimated Creatinine Clearance: 46.4 mL/min (A) (by C-G formula based on SCr of 1.33 mg/dL (H)). Liver Function Tests: No results for input(s): AST, ALT, ALKPHOS, BILITOT, PROT, ALBUMIN in the last 168 hours. No results for input(s): LIPASE, AMYLASE in the last 168 hours. No results for input(s): AMMONIA in the last 168 hours. Coagulation Profile: No results for input(s): INR, PROTIME in the last 168 hours. Cardiac Enzymes: No results for input(s): CKTOTAL, CKMB, CKMBINDEX, TROPONINI in the last 168 hours. BNP (last 3 results) No results for input(s): PROBNP in the last 8760 hours. HbA1C: No results for input(s): HGBA1C in the last 72 hours.  CBG: No results for input(s): GLUCAP in the last 168 hours. Lipid Profile: No results for input(s): CHOL, HDL, LDLCALC, TRIG, CHOLHDL, LDLDIRECT in the last 72 hours. Thyroid Function Tests: No results for input(s): TSH, T4TOTAL, FREET4, T3FREE, THYROIDAB in the last 72 hours.  Anemia Panel: No results for input(s): VITAMINB12, FOLATE, FERRITIN, TIBC, IRON , RETICCTPCT in the last 72 hours. Urine analysis:    Component Value Date/Time   COLORURINE STRAW (A) 01/13/2021 1234   APPEARANCEUR CLEAR 01/13/2021 1234   LABSPEC 1.003 (L) 01/13/2021 1234   PHURINE 5.0 01/13/2021 1234   GLUCOSEU NEGATIVE 01/13/2021 1234   HGBUR NEGATIVE 01/13/2021 1234   BILIRUBINUR NEGATIVE 01/13/2021 1234   KETONESUR NEGATIVE 01/13/2021 1234   PROTEINUR NEGATIVE 01/13/2021 1234    UROBILINOGEN 0.2 12/06/2012 1020   NITRITE NEGATIVE 01/13/2021 1234   LEUKOCYTESUR NEGATIVE 01/13/2021 1234   Sepsis Labs: @LABRCNTIP (procalcitonin:4,lacticidven:4)  )No results found for this or any previous visit (from the past 240 hours).   Radiology Studies: ECHO TEE Result Date: 08/10/2023    TRANSESOPHOGEAL ECHO REPORT   Patient Name:   LAITYN BENSEN Date of Exam: 08/10/2023 Medical Rec #:  [id number]       Height:       64.0 in Accession #:    [id number]      Weight:       210.3 lb Date of Birth:  [birth year]       BSA:          1.999 m Patient Age:    64 years  BP:           109/85 mmHg Patient Gender: F               HR:           78 bpm. Exam Location:  Inpatient Procedure: Transesophageal Echo, Cardiac Doppler and Color Doppler Indications:     Atrial Fibrillation I48.91  History:         Patient has prior history of Echocardiogram examinations, most                  recent 06/01/2023. CHF, CAD, PAD and COPD, Arrythmias:Atrial                  Fibrillation; Risk Factors:Hypertension and Current Smoker.  Sonographer:     Thea Norlander RCS Referring Phys:  [id number] JORDAN LEE Diagnosing Phys: Ezra Kanner PROCEDURE: After discussion of the risks and benefits of a TEE, an informed consent was obtained from the patient. The transesophogeal probe was passed without difficulty through the esophogus of the patient. Imaged were obtained with the patient in a left lateral decubitus position. Sedation performed by different physician. The patient was monitored while under deep sedation. Anesthestetic sedation was provided intravenously by Anesthesiology: 108.3mg  of Propofol , 100mg  of Lidocaine . The patient developed no complications during the procedure. A successful direct current cardioversion was performed at 360 joules with 2 attempts.  IMPRESSIONS  1. Left ventricular ejection fraction, by estimation, is 25 to 30%. The left ventricle has severely decreased function. The left ventricle  demonstrates global hypokinesis. The left ventricular internal cavity size was mildly dilated.  2. Peak RV-RA gradient 18 mmHg. Right ventricular systolic function is mildly reduced. The right ventricular size is normal.  3. Left atrial size was moderately dilated. No left atrial/left atrial appendage thrombus was detected.  4. The mitral valve is normal in structure. Mild to moderate mitral valve regurgitation. No evidence of mitral stenosis.  5. The aortic valve is tricuspid. There is mild calcification of the aortic valve. Aortic valve regurgitation is not visualized. No aortic stenosis is present.  6. Small PFO by color doppler. FINDINGS  Left Ventricle: Left ventricular ejection fraction, by estimation, is 25 to 30%. The left ventricle has severely decreased function. The left ventricle demonstrates global hypokinesis. The left ventricular internal cavity size was mildly dilated. There is no left ventricular hypertrophy. Right Ventricle: Peak RV-RA gradient 18 mmHg. The right ventricular size is normal. No increase in right ventricular wall thickness. Right ventricular systolic function is mildly reduced. Left Atrium: Left atrial size was moderately dilated. No left atrial/left atrial appendage thrombus was detected. Right Atrium: Right atrial size was normal in size. Pericardium: There is no evidence of pericardial effusion. Mitral Valve: The mitral valve is normal in structure. There is mild calcification of the mitral valve leaflet(s). Mild to moderate mitral valve regurgitation. No evidence of mitral valve stenosis. Tricuspid Valve: The tricuspid valve is normal in structure. Tricuspid valve regurgitation is mild. Aortic Valve: The aortic valve is tricuspid. There is mild calcification of the aortic valve. Aortic valve regurgitation is not visualized. No aortic stenosis is present. Pulmonic Valve: The pulmonic valve was normal in structure. Pulmonic valve regurgitation is not visualized. Aorta: The aortic  root is normal in size and structure. IAS/Shunts: Small PFO by color doppler. Additional Comments: Spectral Doppler performed. MR Peak grad:    89.5 mmHg    TRICUSPID VALVE MR Mean grad:    59.0 mmHg    TR Peak  grad:   19.4 mmHg MR Vmax:         473.00 cm/s  TR Vmax:        220.00 cm/s MR Vmean:        360.0 cm/s MR PISA:         1.57 cm MR PISA Eff ROA: 13 mm MR PISA Radius:  0.50 cm Dalton McleanMD Electronically signed by Ezra Kanner Signature Date/Time: 08/10/2023/7:34:04 PM    Final    EP STUDY Result Date: 08/10/2023 See surgical note for result.  DG Chest Port 1 View Result Date: 08/09/2023 CLINICAL DATA:  Status post PICC line placement. EXAM: PORTABLE CHEST 1 VIEW COMPARISON:  08/06/2023 FINDINGS: There is a right arm PICC line with tip in the SVC. Stable cardiomediastinal contours. Pulmonary vascular congestion. No pleural fluid or frank interstitial edema. Mild subsegmental atelectasis in the left base. IMPRESSION: 1. Interval placement of right arm PICC line with tip in the SVC. 2. Pulmonary vascular congestion. Electronically Signed   By: Waddell Calk M.D.   On: 08/09/2023 17:10     Scheduled Meds:  ARIPiprazole   5 mg Oral Daily   aspirin   81 mg Oral Daily   atorvastatin   80 mg Oral Daily   Chlorhexidine  Gluconate Cloth  6 each Topical Daily   dapagliflozin  propanediol  10 mg Oral Daily   digoxin   0.125 mg Oral Daily   feeding supplement  237 mL Oral BID BM   fluticasone  furoate-vilanterol  1 puff Inhalation Daily   And   umeclidinium bromide   1 puff Inhalation Daily   methocarbamol   500 mg Oral TID   metoprolol  succinate  50 mg Oral Daily   pantoprazole   40 mg Oral Daily   polyethylene glycol  17 g Oral Daily   senna-docusate  1 tablet Oral BID   sodium chloride  flush  10-40 mL Intracatheter Q12H   spironolactone   25 mg Oral Daily   Continuous Infusions:  amiodarone  30 mg/hr (08/11/23 0426)   furosemide  (LASIX ) 200 mg in dextrose  5 % 100 mL (2 mg/mL) infusion 12  mg/hr (08/10/23 2000)   heparin  1,450 Units/hr (08/11/23 9062)   magnesium  sulfate bolus IVPB     milrinone  0.125 mcg/kg/min (08/10/23 2000)     LOS: 5 days    Time spent:    Sigurd Pac, MD [facility]   08/11/2023, 11:16 AM

## 2023-08-12 ENCOUNTER — Encounter (HOSPITAL_COMMUNITY): Payer: Self-pay | Admitting: Cardiology

## 2023-08-12 ENCOUNTER — Other Ambulatory Visit (HOSPITAL_COMMUNITY): Payer: Self-pay

## 2023-08-12 DIAGNOSIS — I5023 Acute on chronic systolic (congestive) heart failure: Secondary | ICD-10-CM | POA: Diagnosis not present

## 2023-08-12 DIAGNOSIS — I4891 Unspecified atrial fibrillation: Secondary | ICD-10-CM | POA: Diagnosis not present

## 2023-08-12 LAB — BASIC METABOLIC PANEL
Anion gap: 10 (ref 5–15)
BUN: 21 mg/dL (ref 8–23)
CO2: 27 mmol/L (ref 22–32)
Calcium: 9 mg/dL (ref 8.9–10.3)
Chloride: 94 mmol/L — ABNORMAL LOW (ref 98–111)
Creatinine, Ser: 1.29 mg/dL — ABNORMAL HIGH (ref 0.44–1.00)
GFR, Estimated: 46 mL/min — ABNORMAL LOW (ref >60)
Glucose, Bld: 158 mg/dL — ABNORMAL HIGH (ref 70–99)
Potassium: 3.7 mmol/L (ref 3.5–5.1)
Sodium: 131 mmol/L — ABNORMAL LOW (ref 135–145)

## 2023-08-12 LAB — CBC
HCT: 41.8 % (ref 36.0–46.0)
Hemoglobin: 13.9 g/dL (ref 12.0–15.0)
MCH: 27.5 pg (ref 26.0–34.0)
MCHC: 33.3 g/dL (ref 30.0–36.0)
MCV: 82.6 fL (ref 80.0–100.0)
Platelets: 298 10*3/uL (ref 150–400)
RBC: 5.06 MIL/uL (ref 3.87–5.11)
RDW: 15.8 % — ABNORMAL HIGH (ref 11.5–15.5)
WBC: 8.6 10*3/uL (ref 4.0–10.5)
nRBC: 0 % (ref 0.0–0.2)

## 2023-08-12 LAB — COOXEMETRY PANEL
Carboxyhemoglobin: 1.3 % (ref 0.5–1.5)
Carboxyhemoglobin: 1.7 % — ABNORMAL HIGH (ref 0.5–1.5)
Carboxyhemoglobin: 1.9 % — ABNORMAL HIGH (ref 0.5–1.5)
Methemoglobin: <0.7 % (ref 0.0–1.5)
Methemoglobin: 0.8 % (ref 0.0–1.5)
Methemoglobin: 0.7 % (ref 0.0–1.5)
O2 Saturation: 54.9 %
O2 Saturation: 67.9 %
O2 Saturation: 80.6 %
Total hemoglobin: 14.1 g/dL (ref 12.0–16.0)
Total hemoglobin: 12.6 g/dL (ref 12.0–16.0)
Total hemoglobin: 12.9 g/dL (ref 12.0–16.0)

## 2023-08-12 LAB — LIPOPROTEIN A (LPA): Lipoprotein (a): 289.7 nmol/L — ABNORMAL HIGH (ref <75.0)

## 2023-08-12 MED ORDER — TORSEMIDE 20 MG PO TABS
60.0000 mg | ORAL_TABLET | Freq: Every day | ORAL | Status: DC
  Administered 2023-08-12: 60 mg via ORAL
  Filled 2023-08-12: qty 3

## 2023-08-12 MED ORDER — TORSEMIDE 20 MG PO TABS: 40.0000 mg | ORAL_TABLET | Freq: Every day | ORAL | Status: DC

## 2023-08-12 MED ORDER — ALPRAZOLAM 0.5 MG PO TABS
0.5000 mg | ORAL_TABLET | Freq: Once | ORAL | Status: AC
  Administered 2023-08-12: 0.5 mg via ORAL
  Filled 2023-08-12: qty 1

## 2023-08-12 MED ORDER — METOPROLOL SUCCINATE ER 100 MG PO TB24: 50.0000 mg | ORAL_TABLET | Freq: Every day | ORAL | Status: DC

## 2023-08-12 MED ORDER — POTASSIUM CHLORIDE CRYS ER 20 MEQ PO TBCR
40.0000 meq | EXTENDED_RELEASE_TABLET | Freq: Once | ORAL | Status: AC
  Administered 2023-08-12: 40 meq via ORAL
  Filled 2023-08-12: qty 2

## 2023-08-12 MED ORDER — AMIODARONE HCL 200 MG PO TABS: 200.0000 mg | ORAL_TABLET | Freq: Two times a day (BID) | ORAL | Status: DC

## 2023-08-12 MED ORDER — METHOCARBAMOL 500 MG PO TABS
500.0000 mg | ORAL_TABLET | Freq: Three times a day (TID) | ORAL | 1 refills | Status: DC | PRN
  Filled 2023-08-12 – 2024-01-05 (×3): qty 90, 30d supply, fill #0

## 2023-08-12 MED ORDER — SENNOSIDES-DOCUSATE SODIUM 8.6-50 MG PO TABS
1.0000 | ORAL_TABLET | Freq: Two times a day (BID) | ORAL | 1 refills | Status: AC
  Filled 2023-08-12 – 2024-01-05 (×2): qty 60, 30d supply, fill #0

## 2023-08-12 MED ORDER — SPIRONOLACTONE 25 MG PO TABS
25.0000 mg | ORAL_TABLET | Freq: Every day | ORAL | 1 refills | Status: DC
  Filled 2023-08-12: qty 30, 30d supply, fill #0

## 2023-08-12 MED ORDER — TORSEMIDE 20 MG PO TABS
60.0000 mg | ORAL_TABLET | Freq: Two times a day (BID) | ORAL | 1 refills | Status: DC
  Filled 2023-08-12: qty 180, 30d supply, fill #0

## 2023-08-12 MED ORDER — SACUBITRIL-VALSARTAN 24-26 MG PO TABS
1.0000 | ORAL_TABLET | Freq: Two times a day (BID) | ORAL | 1 refills | Status: DC
  Filled 2023-08-12: qty 60, 30d supply, fill #0

## 2023-08-12 NOTE — TOC Progression Note (Signed)
 Transition of Care [facility]) - Progression Note    Patient Details  Name: Autumn [name] MRN: [id number] Date of Birth: 07-05-1956  Transition of Care [facility]) CM/SW Contact  [name] Phone Number: [phone] 08/12/2023, 11:53 AM  Clinical Narrative:   HF  CSW called to schedule pts Hospital follow up appointment for Thursday, August 19, 2023 @ 2:00 PM.   CSW called and spoke with pt over the phone. Pt stated that her grand-daughter in law was providing transportation home.   TOC will continue following.     Expected Discharge Plan: Home/Self Care Barriers to Discharge: Continued Medical Work up  Expected Discharge Plan and Services       Living arrangements for the past 2 months: Single Family Home                                       Social Determinants of Health (SDOH) Interventions SDOH Screenings   Food Insecurity: Patient Declined (08/06/2023)  Housing: Patient Declined (08/06/2023)  Transportation Needs: Patient Declined (08/06/2023)  Recent Concern: Transportation Needs - Unmet Transportation Needs (05/17/2023)   Received from [facility]  Utilities: Patient Declined (08/06/2023)  Alcohol Screen: Low Risk  (06/02/2023)  Financial Resource Strain: Low Risk  (06/02/2023)  Physical Activity: Unknown (05/17/2023)   Received from [facility]  Social Connections: Unknown (08/11/2023)  Stress: No Stress Concern Present (05/17/2023)   Received from [facility]  Tobacco Use: High Risk (08/10/2023)    Readmission Risk Interventions    01/21/2023    1:29 PM 12/07/2022    1:27 PM  Readmission Risk Prevention Plan  Transportation Screening Complete Complete  PCP or Specialist Appt within 3-5 Days  Complete  HRI or Home Care Consult  Complete  Social Work Consult for Recovery Care Planning/Counseling  Complete  Palliative Care Screening  Not Applicable  Medication Review Oceanographer) Complete Complete  PCP or Specialist appointment within  3-5 days of discharge Complete   HRI or Home Care Consult Complete   SW Recovery Care/Counseling Consult Complete   Palliative Care Screening Not Applicable   Skilled Nursing Facility Not Applicable

## 2023-08-12 NOTE — Progress Notes (Addendum)
 Patient ID: Autumn [name], female   DOB: 08/09/1956, 68 y.o.   MRN: [id number]     Advanced Heart Failure Rounding Note  Cardiologist: Dub Huntsman, DO   Subjective:   Chief Complaint: Shortness of breath  Co-ox 55 on Milrinone  0.125 mcg/kg/min.  CVP 5. I/Os not complete. Weight up 4lbs (not sure about accuracy). Remains in NSR. Short episode of bigeminy this am.   Feeling well this am. Ambulating in halls. Sitting up eating breakfast.    TEE 12/31 with EF 25-30%, moderate RV dysfunction, moderate MR.  RHC/LHC: Coronary Findings  Diagnostic Dominance: Right Left Anterior Descending  Mid LAD lesion is 20% stenosed. The lesion was previously treated .    Second Diagonal Branch  2nd Diag lesion is 80% stenosed.    Left Circumflex  Mid Cx lesion is 75% stenosed.    Right Coronary Artery  Prox RCA lesion is 60% stenosed. The lesion is calcified.    Intervention   No interventions have been documented.   Right Heart  Right Heart Pressures RHC Procedural Findings: Hemodynamics (mmHg) RA mean 18 RV 52/14 PA 57/32 mean 43 PCWP mean 28 LV 141/28 AO 139/86  Oxygen  saturations: PA 55% AO 100%  Cardiac Output (Fick) 3.41  Cardiac Index (Fick) 1.7 PVR 4.4 WU  PAPi 1.4     Objective:   Weight Range: 96.7 kg Body mass index is 36.59 kg/m.   Vital Signs:   Temp:  [97.4 F (36.3 C)-99 F (37.2 C)] 99 F (37.2 C) (01/02 0800) Pulse Rate:  [61-71] 71 (01/02 0826) Resp:  [16-20] 20 (01/02 0500) BP: (103-116)/(55-75) 103/56 (01/02 0826) SpO2:  [92 %-98 %] 92 % (01/02 0826) Weight:  [96.7 kg] 96.7 kg (01/02 0500) Last BM Date : 08/11/23  Weight change: Filed Weights   08/10/23 0315 08/11/23 0431 08/12/23 0500  Weight: 95.4 kg 94.7 kg 96.7 kg    Intake/Output:   Intake/Output Summary (Last 24 hours) at 08/12/2023 1009 Last data filed at 08/12/2023 0829 Gross per 24 hour  Intake --  Output 2250 ml  Net -2250 ml    Physical Exam    CVP 5 General:  Well appearing. No distress on RA HEENT: neck supple.   Cardiac: JVP ~7cm +HJR. S1 and S2 present. No murmurs or rub. Resp: Lung sounds clear and equal B/L Abdomen: Soft, non-tender, non-distended. + BS. Extremities: Warm and dry. No rash, cyanosis.  Trace BLE edema.  Neuro: Alert and oriented x3. Affect pleasant. Moves all extremities without difficulty. Lines/Devices:  RUE PICC   Telemetry   NSR in 70s (personally reviewed)  Labs    CBC Recent Labs    08/11/23 0445 08/12/23 0444  WBC 13.3* 8.6  HGB 13.6 13.9  HCT 41.6 41.8  MCV 83.4 82.6  PLT 288 298   Basic Metabolic Panel Recent Labs    98/98/74 0445 08/12/23 0444  NA 137 131*  K 4.2 3.7  CL 95* 94*  CO2 30 27  GLUCOSE 132* 158*  BUN 19 21  CREATININE 1.33* 1.29*  CALCIUM  9.7 9.0  MG 2.0  --    Liver Function Tests No results for input(s): AST, ALT, ALKPHOS, BILITOT, PROT, ALBUMIN in the last 72 hours. No results for input(s): LIPASE, AMYLASE in the last 72 hours. Cardiac Enzymes No results for input(s): CKTOTAL, CKMB, CKMBINDEX, TROPONINI in the last 72 hours.  BNP: BNP (last 3 results) Recent Labs    05/31/23 0712 07/02/23 1153 08/06/23 1802  BNP 606.9* 411.5* 238.2*  ProBNP (last 3 results) No results for input(s): PROBNP in the last 8760 hours.   D-Dimer No results for input(s): DDIMER in the last 72 hours. Hemoglobin A1C No results for input(s): HGBA1C in the last 72 hours.  Fasting Lipid Panel No results for input(s): CHOL, HDL, LDLCALC, TRIG, CHOLHDL, LDLDIRECT in the last 72 hours. Thyroid Function Tests No results for input(s): TSH, T4TOTAL, T3FREE, THYROIDAB in the last 72 hours.  Invalid input(s): FREET3   Other results:   Imaging   No results found.  Medications:    Scheduled Medications:  amiodarone   200 mg Oral BID   ARIPiprazole   5 mg Oral Daily   atorvastatin   80 mg Oral Daily   Chlorhexidine  Gluconate Cloth   6 each Topical Daily   dapagliflozin  propanediol  10 mg Oral Daily   digoxin   0.125 mg Oral Daily   feeding supplement  237 mL Oral BID BM   fluticasone  furoate-vilanterol  1 puff Inhalation Daily   And   umeclidinium bromide   1 puff Inhalation Daily   methocarbamol   500 mg Oral TID   metoprolol  succinate  50 mg Oral Daily   pantoprazole   40 mg Oral Daily   polyethylene glycol  17 g Oral Daily   rivaroxaban   20 mg Oral Q supper   sacubitril -valsartan   1 tablet Oral BID   senna-docusate  1 tablet Oral BID   sodium chloride  flush  10-40 mL Intracatheter Q12H   spironolactone   25 mg Oral Daily    Infusions:  magnesium  sulfate bolus IVPB     milrinone  0.125 mcg/kg/min (08/11/23 1916)    PRN Medications: acetaminophen , fluticasone , gabapentin , levalbuterol , nicotine , ondansetron  **OR** ondansetron  (ZOFRAN ) IV, oxyCODONE -acetaminophen , sodium chloride  flush  Assessment/Plan   1. Acute on chronic systolic CHF: Echo in 6/22 with EF 60-65%.  Has had multiple echoes in 2024, each with lower EF.  Most recent echo in 10/24 with EF 30-35%, moderate-severe MR.  She has a history of CAD but denies recent chest pain.  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  Cath 12/30 showed significant CAD but no critical lesion.  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  She was admitted with volume overload and dyspnea.  RHC 12/30 with elevated right and left heart filling pressures, low PAPi and low CI at 1.7. TEE 12/31 with EF 25-30%, moderate RV dysfunction, moderate MR.  Co-ox 55% today, on milrinone  0.125.  CVP 5 today - On milrinone  0.125 mcg/kg/min. Will repeat co-ox if improved, stop Milrinone .  - Start PO Torsemide  60 mg bid for home.  - Continue Farxiga  10 daily - Continue spironolactone  25 daily - Continue digoxin  0.125 daily - Continue Toprol  XL 50 mg daily (decreased with low output on RHC).  - Continue Entresto  24/26 bid today.   2.  Atrial fibrillation: Persistent, multiple admissions with AF/RVR.  Says she has never been cardioverted.  On Xarelto  at home, heparin  gtt currently. DCCV to NSR on 12/31. NSR today.  - On Amio 200 bid - Continue Toprol  XL and digoxin .  - On Xarelto .   - Would favor eventual AF ablation.  3. 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 Moderate on TEE 12/31.  4. CAD: DES to LAD in 9/20.  No recent chest pain. LHC 12/30 showed patent LAD stent, 75% mid LCx stenosis and heavily calcified 60% proximal RCA stenosis.  - Manage CAD medically given no critical lesion and no chest pain.  - Continue atorvastatin .  -  With Xarelto  use, will stop ASA.  5. COPD: She smokes and uses home oxygen  at times.  - Needs to quit smoking.  6. Sciatica: Significant pain.  Per [facility].  7. OSA: Suspected.  - Will need sleep study.  8. PFO: Small, noted on TEE.   Length of Stay: 6  Jordan Lee, NP  08/12/2023, 10:09 AM  Advanced Heart Failure Team Pager [phone] (M-F; 7a - 5p)  Please contact [facility] for night-coverage after hours (5p -7a ) and weekends on [email or web address]  Patient seen with NP, agree with the above note.    She remains in NSR, now off milrinone .  She denies dyspnea.  CVP 5, co-ox 68% on milrinone  0.125.    General: NAD Neck: No JVD, no thyromegaly or thyroid nodule.  Lungs: Clear to auscultation bilaterally with normal respiratory effort. CV: Nondisplaced PMI.  Heart regular S1/S2, no S3/S4, no murmur.  No peripheral edema.    Abdomen: Soft, nontender, no hepatosplenomegaly, no distention.  Skin: Intact without lesions or rashes.  Neurologic: Alert and oriented x 3.  Psych: Normal affect. Extremities: No clubbing or cyanosis.  HEENT: Normal.   Repeat co-ox off milrinone  later in afternoon.  If stable, think she can go home.   She will continue current GDMT.  Will use torsemide  60 mg bid for home.  She will need close followup in CHF clinic, consider AF  ablation in future.   Ezra Shuck 08/12/2023 1:47 PM

## 2023-08-12 NOTE — Progress Notes (Signed)
   08/12/23 1741  AVS Discharge Documentation  AVS Discharge Instructions Including Medications Provided to patient/caregiver  Name of Person Receiving AVS Discharge Instructions Including Medications [name]  Name of Clinician That Reviewed AVS Discharge Instructions Including Medications Alfonse RN     All personal belongings were returned, all questions were answered. TOC medications delivered to bedside.

## 2023-08-12 NOTE — TOC CM/SW Note (Signed)
..      Durable Medical Equipment  (From admission, onward)           Start     Ordered   08/12/23 1150  For home use only DME Hospital bed  Once       Question Answer Comment  Length of Need Lifetime   Patient has (list medical condition): Heart Failure   The above medical condition requires: Patient requires the ability to reposition immediately   Head must be elevated greater than: 45 degrees   Bed type Semi-electric   Support Surface: Gel Overlay      08/12/23 1150

## 2023-08-12 NOTE — Progress Notes (Signed)
 PICC line removed per order/protocol. Manual pressure was applied and hemostasis was achieved.   No complications.

## 2023-08-12 NOTE — Plan of Care (Signed)
  Problem: Education: Goal: Knowledge of General Education information will improve Description: Including pain rating scale, medication(s)/side effects and non-pharmacologic comfort measures Outcome: Progressing   Problem: Health Behavior/Discharge Planning: Goal: Ability to manage health-related needs will improve Outcome: Progressing   Problem: Clinical Measurements: Goal: Ability to maintain clinical measurements within normal limits will improve Outcome: Progressing Goal: Will remain free from infection Outcome: Progressing Goal: Diagnostic test results will improve Outcome: Progressing Goal: Respiratory complications will improve Outcome: Progressing Goal: Cardiovascular complication will be avoided Outcome: Progressing   Problem: Activity: Goal: Risk for activity intolerance will decrease Outcome: Progressing   Problem: Nutrition: Goal: Adequate nutrition will be maintained Outcome: Progressing   Problem: Coping: Goal: Level of anxiety will decrease Outcome: Progressing   Problem: Elimination: Goal: Will not experience complications related to bowel motility Outcome: Progressing Goal: Will not experience complications related to urinary retention Outcome: Progressing   Problem: Pain Management: Goal: General experience of comfort will improve Outcome: Progressing   Problem: Safety: Goal: Ability to remain free from injury will improve Outcome: Progressing   Problem: Skin Integrity: Goal: Risk for impaired skin integrity will decrease Outcome: Progressing   Problem: Education: Goal: Knowledge of disease or condition will improve Outcome: Progressing Goal: Understanding of medication regimen will improve Outcome: Progressing Goal: Individualized Educational Video(s) Outcome: Progressing   Problem: Activity: Goal: Ability to tolerate increased activity will improve Outcome: Progressing   Problem: Cardiac: Goal: Ability to achieve and maintain  adequate cardiopulmonary perfusion will improve Outcome: Progressing   Problem: Health Behavior/Discharge Planning: Goal: Ability to safely manage health-related needs after discharge will improve Outcome: Progressing   Problem: Education: Goal: Understanding of CV disease, CV risk reduction, and recovery process will improve Outcome: Progressing Goal: Individualized Educational Video(s) Outcome: Progressing   Problem: Activity: Goal: Ability to return to baseline activity level will improve Outcome: Progressing   Problem: Cardiovascular: Goal: Ability to achieve and maintain adequate cardiovascular perfusion will improve Outcome: Progressing Goal: Vascular access site(s) Level 0-1 will be maintained Outcome: Progressing   Problem: Health Behavior/Discharge Planning: Goal: Ability to safely manage health-related needs after discharge will improve Outcome: Progressing   Problem: Education: Goal: Understanding of CV disease, CV risk reduction, and recovery process will improve Outcome: Progressing Goal: Individualized Educational Video(s) Outcome: Progressing   Problem: Activity: Goal: Ability to return to baseline activity level will improve Outcome: Progressing   Problem: Cardiovascular: Goal: Ability to achieve and maintain adequate cardiovascular perfusion will improve Outcome: Progressing Goal: Vascular access site(s) Level 0-1 will be maintained Outcome: Progressing   Problem: Health Behavior/Discharge Planning: Goal: Ability to safely manage health-related needs after discharge will improve Outcome: Progressing

## 2023-08-12 NOTE — Progress Notes (Signed)
 PROGRESS NOTE    Autumn [name]  FMW:[id number] DOB: 02-17-1956 DOA: 08/06/2023 PCP: Bobbette Coye LABOR, MD  67/F w chronic systolic CHF, AFib, CAD, 2L O2-dependent COPD, anxiety/depression/ADHD, fibromyalgia, HLD, OA who presented to the ED on 08/06/2023 from her cardiologist office for evaluation of sciatic lower back/left leg pain and AFib with RVR. She was found to be volume overloaded, so IV lasix , amiodarone  and heparin  infusions were started, and the patient admitted with cardiology consultation. XR demonstrates progressive degenerative disc disease involving L3-L4-L5 levels.   -RHC/LHC 12/30 with moderate to severe nonobstructive CAD, no critical disease, elevated filling pressures, wedge 28, cardiac index 1.7 -12/31 with DC cardioversion  Subjective: -Feels better overall breathing continues to improve  Assessment and Plan:  A-fib with RVR: -Cards following, on Toprol , digoxin , amiodarone  and heparin   -Cardioverted 12/31, in sinus rhythm -Changed to oral amiodarone  and Xarelto    Acute on chronic systolic CHF  Moderate to severe mitral regurgitation -TTE in 05/2023 showed EF 30-35%, global HK, severe LAE, mod-severe MR. -CHF team following, on IV Lasix , Farxiga , Aldactone , Toprol , digoxin  -cath with significant CAD but no critical lesion, RHC with elevated filling pressures, low cardiac index -Started milrinone  12/30, 8.9 L negative -Appears close to euvolemic -Likely wean off milrinone  -Increase activity, discharge planning   Advanced lumbar DDD, widespread OA, fibromyalgia: Known to have severe arthritis, has been seen by orthopedics in [facility] previously.  -Continue gabapentin , oxycodone , Robaxin  -PT OT - Will need orthopedic/spine surgery follow up for this chronic progressive condition   Hypokalemia:  -Supplemented, improved  New diabetes mellitus, hyperglycemia -HbA1c is 6.7 consistent with new diabetes, continue Farxiga   CAD: s/p DES to LAD in 2020.  -  Continue ASA, BB, statin.  -Moderate noncritical CAD on LHC   Chronic hypoxic respiratory failure, COPD: - Quiescent - Continue home 2L O2.  - Continue breztri  and prn xopenex  -Looking cessation counseled   OSA: Not on CPAP therapy. -Outpatient follow-up with pulmonology   Anxiety, depression: Not on home medications.  - Continue aripiprazole , consider prn   Morbid obesity: Body mass index is 37.24 kg/m.    Tobacco use:  - Nicotine  patch prn   GERD:  - PPI   DVT prophylaxis: Xarelto  Code Status: Full code Family Communication: None present Disposition Plan: Home likely tomorrow  Consultants:    Procedures:   Antimicrobials:    Objective: Vitals:   08/12/23 0800 08/12/23 0825 08/12/23 0826 08/12/23 1048  BP: (!) 103/56  (!) 103/56   Pulse: 67 71 71   Resp:      Temp: 99 F (37.2 C)     TempSrc: Oral     SpO2: 92%  92%   Weight:    97.1 kg  Height:        Intake/Output Summary (Last 24 hours) at 08/12/2023 1149 Last data filed at 08/12/2023 9170 Gross per 24 hour  Intake --  Output 2250 ml  Net -2250 ml   Filed Weights   08/11/23 0431 08/12/23 0500 08/12/23 1048  Weight: 94.7 kg 96.7 kg 97.1 kg    Examination:  General exam: Obese chronically ill female sitting up in bed, AAOx3 HEENT: no JVD CVS: S1-S2, regular rhythm Lungs: Clear bilaterally Extremities: Trace edema  Skin: No rashes Psychiatry:  Mood & affect appropriate.     Data Reviewed:   CBC: Recent Labs  Lab 08/08/23 0735 08/09/23 0405 08/09/23 0845 08/10/23 0350 08/11/23 0445 08/12/23 0444  WBC 6.3 7.4  --  9.0 13.3* 8.6  HGB 12.9 12.8 13.3  12.9 13.6 13.6 13.9  HCT 40.3 40.0 39.0  38.0 41.9 41.6 41.8  MCV 86.3 84.9  --  84.6 83.4 82.6  PLT 294 273  --  302 288 298   Basic Metabolic Panel: Recent Labs  Lab 08/07/23 0543 08/07/23 0850 08/08/23 0948 08/09/23 0405 08/09/23 0845 08/10/23 0350 08/11/23 0445 08/12/23 0444  NA  --    < > 135 137 133*  130* 135 137  131*  K  --    < > 4.2 4.8 4.3  4.2 4.0 4.2 3.7  CL  --    < > 98 99  --  94* 95* 94*  CO2  --    < > 26 26  --  30 30 27   GLUCOSE  --    < > 138* 116*  --  113* 132* 158*  BUN  --    < > 17 20  --  20 19 21   CREATININE  --    < > 1.19* 1.13*  --  1.10* 1.33* 1.29*  CALCIUM   --    < > 9.0 9.3  --  9.3 9.7 9.0  MG 2.0  --   --  1.7  --   --  2.0  --   PHOS 4.9*  --   --   --   --   --   --   --    < > = values in this interval not displayed.   GFR: Estimated Creatinine Clearance: 48.6 mL/min (A) (by C-G formula based on SCr of 1.29 mg/dL (H)). Liver Function Tests: No results for input(s): AST, ALT, ALKPHOS, BILITOT, PROT, ALBUMIN in the last 168 hours. No results for input(s): LIPASE, AMYLASE in the last 168 hours. No results for input(s): AMMONIA in the last 168 hours. Coagulation Profile: No results for input(s): INR, PROTIME in the last 168 hours. Cardiac Enzymes: No results for input(s): CKTOTAL, CKMB, CKMBINDEX, TROPONINI in the last 168 hours. BNP (last 3 results) No results for input(s): PROBNP in the last 8760 hours. HbA1C: No results for input(s): HGBA1C in the last 72 hours.  CBG: No results for input(s): GLUCAP in the last 168 hours. Lipid Profile: No results for input(s): CHOL, HDL, LDLCALC, TRIG, CHOLHDL, LDLDIRECT in the last 72 hours. Thyroid Function Tests: No results for input(s): TSH, T4TOTAL, FREET4, T3FREE, THYROIDAB in the last 72 hours.  Anemia Panel: No results for input(s): VITAMINB12, FOLATE, FERRITIN, TIBC, IRON , RETICCTPCT in the last 72 hours. Urine analysis:    Component Value Date/Time   COLORURINE STRAW (A) 01/13/2021 1234   APPEARANCEUR CLEAR 01/13/2021 1234   LABSPEC 1.003 (L) 01/13/2021 1234   PHURINE 5.0 01/13/2021 1234   GLUCOSEU NEGATIVE 01/13/2021 1234   HGBUR NEGATIVE 01/13/2021 1234   BILIRUBINUR NEGATIVE 01/13/2021 1234   KETONESUR NEGATIVE 01/13/2021 1234    PROTEINUR NEGATIVE 01/13/2021 1234   UROBILINOGEN 0.2 12/06/2012 1020   NITRITE NEGATIVE 01/13/2021 1234   LEUKOCYTESUR NEGATIVE 01/13/2021 1234   Sepsis Labs: @LABRCNTIP (procalcitonin:4,lacticidven:4)  )No results found for this or any previous visit (from the past 240 hours).   Radiology Studies: No results found.    Scheduled Meds:  amiodarone   200 mg Oral BID   ARIPiprazole   5 mg Oral Daily   atorvastatin   80 mg Oral Daily   Chlorhexidine  Gluconate Cloth  6 each Topical Daily   dapagliflozin  propanediol  10 mg Oral Daily   digoxin   0.125 mg Oral Daily   feeding supplement  237 mL Oral BID BM   fluticasone  furoate-vilanterol  1 puff Inhalation Daily   And   umeclidinium bromide   1 puff Inhalation Daily   methocarbamol   500 mg Oral TID   metoprolol  succinate  50 mg Oral Daily   pantoprazole   40 mg Oral Daily   polyethylene glycol  17 g Oral Daily   potassium chloride   40 mEq Oral Once   rivaroxaban   20 mg Oral Q supper   sacubitril -valsartan   1 tablet Oral BID   senna-docusate  1 tablet Oral BID   sodium chloride  flush  10-40 mL Intracatheter Q12H   spironolactone   25 mg Oral Daily   torsemide   40 mg Oral Daily   torsemide   60 mg Oral Daily   Continuous Infusions:     LOS: 6 days    Time spent:    Sigurd Pac, MD [facility]   08/12/2023, 11:49 AM

## 2023-08-12 NOTE — Plan of Care (Signed)
  Problem: Education: Goal: Knowledge of General Education information will improve Description: Including pain rating scale, medication(s)/side effects and non-pharmacologic comfort measures Outcome: Adequate for Discharge   Problem: Health Behavior/Discharge Planning: Goal: Ability to manage health-related needs will improve Outcome: Adequate for Discharge   Problem: Clinical Measurements: Goal: Ability to maintain clinical measurements within normal limits will improve Outcome: Adequate for Discharge Goal: Will remain free from infection Outcome: Adequate for Discharge Goal: Diagnostic test results will improve Outcome: Adequate for Discharge Goal: Respiratory complications will improve Outcome: Adequate for Discharge Goal: Cardiovascular complication will be avoided Outcome: Adequate for Discharge   Problem: Activity: Goal: Risk for activity intolerance will decrease Outcome: Adequate for Discharge   Problem: Nutrition: Goal: Adequate nutrition will be maintained Outcome: Adequate for Discharge   Problem: Coping: Goal: Level of anxiety will decrease Outcome: Adequate for Discharge   Problem: Elimination: Goal: Will not experience complications related to bowel motility Outcome: Adequate for Discharge Goal: Will not experience complications related to urinary retention Outcome: Adequate for Discharge   Problem: Pain Management: Goal: General experience of comfort will improve Outcome: Adequate for Discharge   Problem: Safety: Goal: Ability to remain free from injury will improve Outcome: Adequate for Discharge   Problem: Skin Integrity: Goal: Risk for impaired skin integrity will decrease Outcome: Adequate for Discharge   Problem: Education: Goal: Knowledge of disease or condition will improve Outcome: Adequate for Discharge Goal: Understanding of medication regimen will improve Outcome: Adequate for Discharge Goal: Individualized Educational  Video(s) Outcome: Adequate for Discharge   Problem: Activity: Goal: Ability to tolerate increased activity will improve Outcome: Adequate for Discharge   Problem: Health Behavior/Discharge Planning: Goal: Ability to safely manage health-related needs after discharge will improve Outcome: Adequate for Discharge   Problem: Cardiac: Goal: Ability to achieve and maintain adequate cardiopulmonary perfusion will improve Outcome: Adequate for Discharge   Problem: Education: Goal: Understanding of CV disease, CV risk reduction, and recovery process will improve Outcome: Adequate for Discharge Goal: Individualized Educational Video(s) Outcome: Adequate for Discharge   Problem: Health Behavior/Discharge Planning: Goal: Ability to safely manage health-related needs after discharge will improve Outcome: Adequate for Discharge   Problem: Activity: Goal: Ability to return to baseline activity level will improve Outcome: Adequate for Discharge   Problem: Cardiovascular: Goal: Ability to achieve and maintain adequate cardiovascular perfusion will improve Outcome: Adequate for Discharge Goal: Vascular access site(s) Level 0-1 will be maintained Outcome: Adequate for Discharge   Problem: Education: Goal: Understanding of CV disease, CV risk reduction, and recovery process will improve Outcome: Adequate for Discharge Goal: Individualized Educational Video(s) Outcome: Adequate for Discharge   Problem: Health Behavior/Discharge Planning: Goal: Ability to safely manage health-related needs after discharge will improve Outcome: Adequate for Discharge   Problem: Activity: Goal: Ability to return to baseline activity level will improve Outcome: Adequate for Discharge   Problem: Cardiovascular: Goal: Ability to achieve and maintain adequate cardiovascular perfusion will improve Outcome: Adequate for Discharge Goal: Vascular access site(s) Level 0-1 will be maintained Outcome: Adequate for  Discharge   Problem: Health Behavior/Discharge Planning: Goal: Ability to safely manage health-related needs after discharge will improve Outcome: Adequate for Discharge

## 2023-08-12 NOTE — Progress Notes (Signed)
   08/12/23 1030  PICC Triple Lumen 08/09/23 Right Basilic 38 cm 0 cm  Placement Date/Time: 08/09/23 1512   Serial / Lot #: [id number]  Time Out: (c) Correct site;Correct procedure;Correct patient  Maximum sterile barrier precautions: Hand hygiene;Large sterile sheet;Cap;Sterile probe cover;Mask;Sterile gown;Sterile gloves...  Line Care Connections checked and tightened;Lumen 1 cap changed;Lumen 2 cap changed;Lumen 3 cap changed     IV Team consult to assess occlusion in all ports.

## 2023-08-12 NOTE — Care Management Important Message (Signed)
 Important Message  Patient Details  Name: Autumn [name] MRN: [id number] Date of Birth: October 02, 1955   Important Message Given:  Yes - Medicare IM     [name] 08/12/2023, 12:49 PM

## 2023-08-12 NOTE — Progress Notes (Signed)
 Mobility Specialist Progress Note:    08/12/23 1100  Mobility  Activity Ambulated with assistance in hallway  Level of Assistance Contact guard assist, steadying assist  Assistive Device Four wheel walker  Distance Ambulated (ft) 200 ft  Activity Response Tolerated well  Mobility Referral Yes  Mobility visit 1 Mobility  Mobility Specialist Start Time (ACUTE ONLY) 1004  Mobility Specialist Stop Time (ACUTE ONLY) 1015  Mobility Specialist Time Calculation (min) (ACUTE ONLY) 11 min   Pt received on EOB, agreeable to mobility. C/o some nausea this date, but felt better with ambulation. Asymptomatic throughout. Pt left on EOB with call bell and all needs met.  D'Vante Nicholaus Mobility Specialist Please contact via Special Educational Needs Teacher or Rehab office at [phone]

## 2023-08-12 NOTE — TOC Initial Note (Signed)
 Transition of Care [facility]) - Initial/Assessment Note    Patient Details  Name: Autumn [name] MRN: [id number] Date of Birth: 1955-11-08  [facility] CM/SW Contact:    Justina Delcia Czar, RN Phone Number: [phone] 08/12/2023, 12:33 PM  Clinical Narrative:                 HF TOC CM spoke to pt and states her family will provide transportation home. Pt has Rollator, cane and bedside commode at home. States she will be able to afford scale. Reviewed daily weights to manage HF and discussed low sodium and heart healthy diet.  Contacted [employer] [facility] rep, Mitch for hospital bed.  PCP appt arranged for 08/16/2023, information on pt's AVS.   Expected Discharge Plan: Home/Self Care Barriers to Discharge: No Barriers Identified   Patient Goals and CMS Choice Patient states their goals for this hospitalization and ongoing recovery are:: wants to be able to walk          Expected Discharge Plan and Services   Discharge Planning Services: CM Consult   Living arrangements for the past 2 months: Single Family Home                 DME Arranged: Hospital bed DME Agency: [employer] Date DME Agency Contacted: 08/12/23 Time DME Agency Contacted: 1232 Representative spoke with at DME Agency: Mitch            Prior Living Arrangements/Services Living arrangements for the past 2 months: Single Family Home Lives with:: Relatives, Minor Children Patient language and need for interpreter reviewed:: Yes Do you feel safe going back to the place where you live?: Yes      Need for Family Participation in Patient Care: No (Comment) Care giver support system in place?: Yes (comment)   Criminal Activity/Legal Involvement Pertinent to Current Situation/Hospitalization: No - Comment as needed  Activities of Daily Living   ADL Screening (condition at time of admission) Independently performs ADLs?: Yes (appropriate for developmental age) Is the patient deaf or have difficulty  hearing?: No Does the patient have difficulty seeing, even when wearing glasses/contacts?: No Does the patient have difficulty concentrating, remembering, or making decisions?: No  Permission Sought/Granted Permission sought to share information with : Case Manager, Family Supports, PCP Permission granted to share information with : Yes, Verbal Permission Granted  Share Information with NAME: Meade Fell  Permission granted to share info w AGENCY: DME  Permission granted to share info w Relationship: daughter  Permission granted to share info w Contact Information: [phone]  Emotional Assessment Appearance:: Appears stated age Attitude/Demeanor/Rapport: Engaged Affect (typically observed): Appropriate Orientation: : Oriented to Self, Oriented to Place, Oriented to  Time, Oriented to Situation Alcohol / Substance Use: Not Applicable Psych Involvement: No (comment)  Admission diagnosis:  Hypokalemia [E87.6] Sciatica of left side [M54.32] Atrial fibrillation with RVR (HCC) [I48.91] Noncompliance w/medication treatment due to intermit use of medication [Z91.148] Acute on chronic congestive heart failure, unspecified heart failure type (HCC) [I50.9] Patient Active Problem List   Diagnosis Date Noted   Atrial fibrillation with RVR (HCC) 08/06/2023   Sciatica of left side 08/06/2023   Hypokalemia 08/06/2023   Acute exacerbation of CHF (congestive heart failure) (HCC) 05/31/2023   Acute on chronic congestive heart failure (HCC) 02/22/2023   Paroxysmal atrial fibrillation (HCC) 02/22/2023   Acute hypoxic respiratory failure (HCC) 02/17/2023   Acute on chronic systolic heart failure (HCC) 01/18/2023   COPD (chronic obstructive pulmonary disease) (HCC) 12/03/2022  Parkinson's disease (HCC) 12/03/2022   PAD (peripheral artery disease) (HCC) 12/03/2022   CAD (coronary artery disease) S/P stent to LAD 12/03/2022   Atrial fibrillation with rapid ventricular response (HCC) 01/11/2021    Tobacco dependence 01/11/2021   Homelessness 01/11/2021   Alcohol dependence with uncomplicated withdrawal (HCC) 06/27/2017   Polysubstance abuse (HCC) 06/27/2017   Depression 06/27/2017   Essential hypertension 06/27/2017   GERD (gastroesophageal reflux disease) 06/27/2017   Attention deficit disorder (ADD)    PCP:  Corrington, Coye LABOR, MD Pharmacy:   [facility] - [facility] [address] [place] [address] Phone: [phone] Fax: [phone]  [id number] [place], [place] - [address] AT [facility] OF [address] & [facility] [address] [place] [address] Phone: [phone] Fax: [phone]  [id number] [place], [place] - [address] AT [facility] OF [address] [address] & [facility] [address] [place] [address] Phone: [phone] Fax: [phone]     Social Drivers of Health (SDOH) Social History: SDOH Screenings   Food Insecurity: Patient Declined (08/06/2023)  Housing: Patient Declined (08/06/2023)  Transportation Needs: Patient Declined (08/06/2023)  Recent Concern: Transportation Needs - Unmet Transportation Needs (05/17/2023)   Received from [facility]  Utilities: Patient Declined (08/06/2023)  Alcohol Screen: Low Risk  (06/02/2023)  Financial Resource Strain: Low Risk  (06/02/2023)  Physical Activity: Unknown (05/17/2023)   Received from [facility]  Social Connections: Unknown (08/11/2023)  Stress: No Stress Concern Present (05/17/2023)   Received from [facility]  Tobacco Use: High Risk (08/10/2023)   SDOH Interventions:     Readmission Risk Interventions    01/21/2023    1:29 PM 12/07/2022    1:27 PM  Readmission Risk Prevention Plan  Transportation Screening Complete Complete  PCP or Specialist Appt within 3-5 Days  Complete  HRI or Home Care Consult  Complete  Social Work Consult for Recovery Care Planning/Counseling  Complete  Palliative Care Screening  Not Applicable   Medication Review Oceanographer) Complete Complete  PCP or Specialist appointment within 3-5 days of discharge Complete   HRI or Home Care Consult Complete   SW Recovery Care/Counseling Consult Complete   Palliative Care Screening Not Applicable   Skilled Nursing Facility Not Applicable

## 2023-08-13 NOTE — Discharge Summary (Signed)
 Physician Discharge Summary  Autumn [name] FMW:[id number] DOB: 19-Aug-1955 DOA: 08/06/2023  PCP: Bobbette Coye LABOR, MD  Admit date: 08/06/2023 Discharge date: 08/12/2023  Time spent: 45 minutes  Recommendations for Outpatient Follow-up:  [facility] in 2 weeks   Discharge Diagnoses:  Principal Problem:   Atrial fibrillation with RVR (HCC) Acute on chronic systolic CHF Moderate to severe mitral regurgitation Advanced lumbar DJD, osteoarthritis COPD/chronic hypoxic respiratory failure on 2 L home O2 CAD Type 2 diabetes mellitus Hypokalemia Obstructive sleep apnea Anxiety, depression   Sciatica of left side   Hypokalemia   Discharge Condition: Diet, heart healthy  Diet recommendation: Improved  Filed Weights   08/11/23 0431 08/12/23 0500 08/12/23 1048  Weight: 94.7 kg 96.7 kg 97.1 kg    History of present illness:  67/F w chronic systolic CHF, AFib, CAD, 2L O2-dependent COPD, anxiety/depression/ADHD, fibromyalgia, HLD, OA who presented to the ED on 08/06/2023 from her cardiologist office for evaluation of sciatic lower back/left leg pain and AFib with RVR. She was found to be volume overloaded, so IV lasix , amiodarone  and heparin  infusions were started, and the patient admitted with cardiology consultation. XR demonstrates progressive degenerative disc disease involving L3-L4-L5 levels.   -RHC/LHC 12/30 with moderate to severe nonobstructive CAD, no critical disease, elevated filling pressures, wedge 28, cardiac index 1.7 -12/31 with DC cardioversion  Hospital Course:  A-fib with RVR: -Cards following, on Toprol , digoxin , amiodarone  and heparin   -Cardioverted 12/31, in sinus rhythm -Changed to oral amiodarone  and Xarelto    Acute on chronic systolic CHF  Moderate to severe mitral regurgitation -TTE in 05/2023 showed EF 30-35%, global HK, severe LAE, mod-severe MR. -CHF team following, on IV Lasix , Farxiga , Aldactone , Toprol , digoxin  -cath with significant CAD but  no critical lesion, RHC with elevated filling pressures, low cardiac index -Started milrinone  12/30, 8.9 L negative -Now you may, weaned off milrinone , transitioned to oral torsemide  -Follow-up in heart failure clinic   Advanced lumbar DDD, widespread OA, fibromyalgia: Known to have severe arthritis, has been seen by orthopedics in [facility] system previously.  -Continue gabapentin , oxycodone , Robaxin  -PT OT - Will need orthopedic/spine surgery follow up for this chronic progressive condition   Hypokalemia:  -Supplemented, improved   New diabetes mellitus, hyperglycemia -HbA1c is 6.7 consistent with new diabetes, continue Farxiga    CAD: s/p DES to LAD in 2020.  - Continue ASA, BB, statin.  -Moderate noncritical CAD on LHC   Chronic hypoxic respiratory failure, COPD: - Quiescent - Continue home 2L O2.  - Continue breztri  and prn xopenex  -Looking cessation counseled   OSA: Not on CPAP therapy. -Outpatient follow-up with pulmonology   Anxiety, depression: Not on home medications.  - Continue aripiprazole , consider prn   Morbid obesity: Body mass index is 37.24 kg/m.    Tobacco use:  - Nicotine  patch prn   GERD:   Discharge Exam: Vitals:   08/12/23 1310 08/12/23 1704  BP: (!) 96/51 (!) 117/56  Pulse: 68 69  Resp: 16 16  Temp: 98.1 F (36.7 C) 98.3 F (36.8 C)  SpO2: 95% 100%   General exam: Obese chronically ill female sitting up in bed, AAOx3 HEENT: no JVD CVS: S1-S2, regular rhythm Lungs: Clear bilaterally Extremities: Trace edema  Skin: No rashes Psychiatry:  Mood & affect appropriate.   Discharge Instructions   Discharge Instructions     Diet - low sodium heart healthy   Complete by: As directed    Increase activity slowly   Complete by: As directed  Allergies as of 08/12/2023       Reactions   Pamelor [nortriptyline Hcl] Anaphylaxis   Vibra-tab [doxycycline] Other (See Comments)   Unknown reaction        Medication List     STOP taking  these medications    DULoxetine  30 MG capsule Commonly known as: CYMBALTA        TAKE these medications    albuterol  108 (90 Base) MCG/ACT inhaler Commonly known as: VENTOLIN  HFA Inhale 2 puffs into the lungs every 4 (four) hours as needed for wheezing.   amiodarone  200 MG tablet Commonly known as: PACERONE  Take 1 tablet (200 mg total) by mouth 2 (two) times daily. What changed: how much to take   ARIPiprazole  5 MG tablet Commonly known as: ABILIFY  Take 1 tablet (5 mg total) by mouth daily.   Aspirin  Low Dose 81 MG chewable tablet Generic drug: aspirin  Chew 1 tablet (81 mg total) by mouth daily.   atorvastatin  80 MG tablet Commonly known as: LIPITOR  Take 1 tablet (80 mg total) by mouth daily.   Breztri  Aerosphere 160-9-4.8 MCG/ACT Aero Generic drug: Budeson-Glycopyrrol-Formoterol  Inhale 2 puffs into the lungs in the morning and at bedtime. What changed: Another medication with the same name was removed. Continue taking this medication, and follow the directions you see here.   digoxin  0.125 MG tablet Commonly known as: LANOXIN  Take 1 tablet (0.125 mg total) by mouth daily.   Entresto  24-26 MG Generic drug: sacubitril -valsartan  Take 1 tablet by mouth 2 (two) times daily.   Farxiga  10 MG Tabs tablet Generic drug: dapagliflozin  propanediol Take 1 tablet (10 mg total) by mouth daily.   fluticasone  50 MCG/ACT nasal spray Commonly known as: FLONASE  Place 2 sprays into both nostrils daily as needed for allergies.   gabapentin  300 MG capsule Commonly known as: NEURONTIN  Take 2 capsules (600 mg total) by mouth 3 (three) times daily as needed (Takes 2-3 as needed). What changed: reasons to take this   methocarbamol  500 MG tablet Commonly known as: ROBAXIN  Take 1 tablet (500 mg total) by mouth every 8 (eight) hours as needed for muscle spasms.   metoprolol  succinate 100 MG 24 hr tablet Commonly known as: TOPROL -XL Take 0.5 tablets (50 mg total) by mouth daily. Take  with or immediately following a meal. What changed: how much to take   naloxone  4 MG/0.1ML Liqd nasal spray kit Commonly known as: NARCAN  Place 4 mg into the nose as needed (overdose, as needed).   nicotine  14 mg/24hr patch Commonly known as: NICODERM CQ  - dosed in mg/24 hours Place 1 patch (14 mg total) onto the skin daily.   pantoprazole  40 MG tablet Commonly known as: Protonix  Take 1 tablet (40 mg total) by mouth daily.   Senna-S 8.6-50 MG tablet Generic drug: senna-docusate Take 1 tablet by mouth 2 (two) times daily.   spironolactone  25 MG tablet Commonly known as: ALDACTONE  Take 1 tablet (25 mg total) by mouth daily. What changed: how much to take   torsemide  20 MG tablet Commonly known as: DEMADEX  Take 60mg  (3 tablets) by mouth twice a day What changed:  how much to take how to take this when to take this additional instructions   Xarelto  20 MG Tabs tablet Generic drug: rivaroxaban  Take 1 tablet (20 mg total) by mouth daily with supper.       Allergies  Allergen Reactions   Pamelor [Nortriptyline Hcl] Anaphylaxis   Vibra-Tab [Doxycycline] Other (See Comments)    Unknown reaction    Follow-up  Information     [facility] Follow up on 08/23/2023.   Specialty: Cardiology Why: [facility] at 3pm with Dr. [facility], Entrance C Contact information: [address] [place]  [phone]        Corrington, Kip A, MD. Go in 6 day(s).   Specialty: Family Medicine Why: Hospital follow up appointment scheduled for Thursday, August 19, 2023 @ 2:00 PM.  PLEASE ARRIVE 10-15 minutes early.  PLEASE call to cancale/reschedule if you CANNOT make appointment. Contact information: [address] [place] [address] [phone]         [facility] Follow up.   Why: Hospital Bed agency will call to arrange delivery Contact  information: [address] [place] [address] [phone]                  The results of significant diagnostics from this hospitalization (including imaging, microbiology, ancillary and laboratory) are listed below for reference.    Significant Diagnostic Studies: ECHO TEE Result Date: 08/10/2023    TRANSESOPHOGEAL ECHO REPORT   Patient Name:   Autumn [name] Yahnke Date of Exam: 08/10/2023 Medical Rec #:  [id number]       Height:       64.0 in Accession #:    [id number]      Weight:       210.3 lb Date of Birth:  10-05-1955       BSA:          1.999 m Patient Age:    67 years        BP:           109/85 mmHg Patient Gender: F               HR:           78 bpm. Exam Location:  Inpatient Procedure: Transesophageal Echo, Cardiac Doppler and Color Doppler Indications:     Atrial Fibrillation I48.91  History:         Patient has prior history of Echocardiogram examinations, most                  recent 06/01/2023. CHF, CAD, PAD and COPD, Arrythmias:Atrial                  Fibrillation; Risk Factors:Hypertension and Current Smoker.  Sonographer:     Thea Norlander RCS Referring Phys:  [id number] JORDAN LEE Diagnosing Phys: Ezra Kanner PROCEDURE: After discussion of the risks and benefits of a TEE, an informed consent was obtained from the patient. The transesophogeal probe was passed without difficulty through the esophogus of the patient. Imaged were obtained with the patient in a left lateral decubitus position. Sedation performed by different physician. The patient was monitored while under deep sedation. Anesthestetic sedation was provided intravenously by Anesthesiology: 108.3mg  of Propofol , 100mg  of Lidocaine . The patient developed no complications during the procedure. A successful direct current cardioversion was performed at 360 joules with 2 attempts.  IMPRESSIONS  1. Left ventricular ejection fraction, by estimation, is 25 to 30%. The left ventricle has severely decreased function.  The left ventricle demonstrates global hypokinesis. The left ventricular internal cavity size was mildly dilated.  2. Peak RV-RA gradient 18 mmHg. Right ventricular systolic function is mildly reduced. The right ventricular size is normal.  3. Left atrial size was moderately dilated. No left atrial/left atrial appendage thrombus was  detected.  4. The mitral valve is normal in structure. Mild to moderate mitral valve regurgitation. No evidence of mitral stenosis.  5. The aortic valve is tricuspid. There is mild calcification of the aortic valve. Aortic valve regurgitation is not visualized. No aortic stenosis is present.  6. Small PFO by color doppler. FINDINGS  Left Ventricle: Left ventricular ejection fraction, by estimation, is 25 to 30%. The left ventricle has severely decreased function. The left ventricle demonstrates global hypokinesis. The left ventricular internal cavity size was mildly dilated. There is no left ventricular hypertrophy. Right Ventricle: Peak RV-RA gradient 18 mmHg. The right ventricular size is normal. No increase in right ventricular wall thickness. Right ventricular systolic function is mildly reduced. Left Atrium: Left atrial size was moderately dilated. No left atrial/left atrial appendage thrombus was detected. Right Atrium: Right atrial size was normal in size. Pericardium: There is no evidence of pericardial effusion. Mitral Valve: The mitral valve is normal in structure. There is mild calcification of the mitral valve leaflet(s). Mild to moderate mitral valve regurgitation. No evidence of mitral valve stenosis. Tricuspid Valve: The tricuspid valve is normal in structure. Tricuspid valve regurgitation is mild. Aortic Valve: The aortic valve is tricuspid. There is mild calcification of the aortic valve. Aortic valve regurgitation is not visualized. No aortic stenosis is present. Pulmonic Valve: The pulmonic valve was normal in structure. Pulmonic valve regurgitation is not visualized.  Aorta: The aortic root is normal in size and structure. IAS/Shunts: Small PFO by color doppler. Additional Comments: Spectral Doppler performed. MR Peak grad:    89.5 mmHg    TRICUSPID VALVE MR Mean grad:    59.0 mmHg    TR Peak grad:   19.4 mmHg MR Vmax:         473.00 cm/s  TR Vmax:        220.00 cm/s MR Vmean:        360.0 cm/s MR PISA:         1.57 cm MR PISA Eff ROA: 13 mm MR PISA Radius:  0.50 cm Dalton McleanMD Electronically signed by Ezra Kanner Signature Date/Time: 08/10/2023/7:34:04 PM    Final    EP STUDY Result Date: 08/10/2023 See surgical note for result.  DG Chest Port 1 View Result Date: 08/09/2023 CLINICAL DATA:  Status post PICC line placement. EXAM: PORTABLE CHEST 1 VIEW COMPARISON:  08/06/2023 FINDINGS: There is a right arm PICC line with tip in the SVC. Stable cardiomediastinal contours. Pulmonary vascular congestion. No pleural fluid or frank interstitial edema. Mild subsegmental atelectasis in the left base. IMPRESSION: 1. Interval placement of right arm PICC line with tip in the SVC. 2. Pulmonary vascular congestion. Electronically Signed   By: Waddell Calk M.D.   On: 08/09/2023 17:10   CARDIAC CATHETERIZATION Result Date: 08/09/2023   Prox RCA lesion is 60% stenosed.   Mid LAD lesion is 20% stenosed.   2nd Diag lesion is 80% stenosed.   Mid Cx lesion is 75% stenosed. 1. Elevated right and left heart filling pressures. 2. Mixed pulmonary venous/pulmonary arterial hypertension. 3. Low PAPi 4. Low CI, 1.7. 5. Patent LAD stent.  75% mid LCx stenosis and heavily calcified 60% proximal RCA stenosis.  Manage CAD medically given no critical lesion and no chest pain.  Will need ongoing aggressive diuresis and will use milrinone  for now with low cardiac output.   US  EKG SITE RITE Result Date: 08/09/2023 If Site Rite image not attached, placement could not be confirmed due to current cardiac rhythm.  DG Hip Unilat W or Wo Pelvis 2-3 Views Left Result Date:  08/06/2023 CLINICAL DATA:  Pain. EXAM: DG HIP (WITH OR WITHOUT PELVIS) 3V LEFT COMPARISON:  10/28/2010. FINDINGS: There is bilateral hip and sacroiliac degenerative change with joint space narrowing and small osteophytes. Pelvic ring is intact. No acute fracture, dislocation or subluxation. No osteolytic or osteoblastic lesions. IMPRESSION: Bilateral hip degenerative changes.  No acute osseous abnormalities. Electronically Signed   By: Fonda Field M.D.   On: 08/06/2023 16:23   DG Lumbar Spine 2-3 Views Result Date: 08/06/2023 CLINICAL DATA:  Pain. EXAM: LUMBAR SPINE 3  VIEW COMPARISON:  10/28/2010. FINDINGS: Significantly worse multilevel lumbar degenerative disc disease with disc space narrowing, sclerosis and marginal osteophytes most severe at the L3-L4-L5 levels. No significant compression deformities. Aortoiliac atheromatous calcifications IMPRESSION: Worsening degenerative changes. Electronically Signed   By: Fonda Field M.D.   On: 08/06/2023 16:21   DG Chest Port 1 View Result Date: 08/06/2023 CLINICAL DATA:  Atrial fibrillation EXAM: PORTABLE CHEST 1 VIEW COMPARISON:  05/31/2023 FINDINGS: Single frontal view of the chest demonstrates mild enlargement the cardiac silhouette. No airspace disease, effusion, or pneumothorax. No acute bony abnormalities. IMPRESSION: 1. Mild enlargement of the cardiac silhouette. 2. No acute airspace disease. Electronically Signed   By: Ozell Daring M.D.   On: 08/06/2023 15:15    Microbiology: No results found for this or any previous visit (from the past 240 hours).   Labs: Basic Metabolic Panel: Recent Labs  Lab 08/07/23 0543 08/07/23 0850 08/08/23 9051 08/09/23 0405 08/09/23 0845 08/10/23 0350 08/11/23 0445 08/12/23 0444  NA  --    < > 135 137 133*  130* 135 137 131*  K  --    < > 4.2 4.8 4.3  4.2 4.0 4.2 3.7  CL  --    < > 98 99  --  94* 95* 94*  CO2  --    < > 26 26  --  30 30 27   GLUCOSE  --    < > 138* 116*  --  113* 132* 158*   BUN  --    < > 17 20  --  20 19 21   CREATININE  --    < > 1.19* 1.13*  --  1.10* 1.33* 1.29*  CALCIUM   --    < > 9.0 9.3  --  9.3 9.7 9.0  MG 2.0  --   --  1.7  --   --  2.0  --   PHOS 4.9*  --   --   --   --   --   --   --    < > = values in this interval not displayed.   Liver Function Tests: No results for input(s): AST, ALT, ALKPHOS, BILITOT, PROT, ALBUMIN in the last 168 hours. No results for input(s): LIPASE, AMYLASE in the last 168 hours. No results for input(s): AMMONIA in the last 168 hours. CBC: Recent Labs  Lab 08/08/23 0735 08/09/23 0405 08/09/23 0845 08/10/23 0350 08/11/23 0445 08/12/23 0444  WBC 6.3 7.4  --  9.0 13.3* 8.6  HGB 12.9 12.8 13.3  12.9 13.6 13.6 13.9  HCT 40.3 40.0 39.0  38.0 41.9 41.6 41.8  MCV 86.3 84.9  --  84.6 83.4 82.6  PLT 294 273  --  302 288 298   Cardiac Enzymes: No results for input(s): CKTOTAL, CKMB, CKMBINDEX, TROPONINI in the last 168 hours. BNP: BNP (last 3 results) Recent Labs    05/31/23  9287 07/02/23 1153 08/06/23 1802  BNP 606.9* 411.5* 238.2*    ProBNP (last 3 results) No results for input(s): PROBNP in the last 8760 hours.  CBG: No results for input(s): GLUCAP in the last 168 hours.     Signed:  Sigurd Pac MD.  [facility] 08/13/2023, 1:24 PM

## 2023-08-23 ENCOUNTER — Other Ambulatory Visit (HOSPITAL_COMMUNITY): Payer: Self-pay

## 2023-08-23 ENCOUNTER — Encounter (HOSPITAL_COMMUNITY): Payer: Self-pay | Admitting: Cardiology

## 2023-08-23 ENCOUNTER — Other Ambulatory Visit: Payer: Self-pay

## 2023-08-23 ENCOUNTER — Ambulatory Visit (HOSPITAL_COMMUNITY)
Admit: 2023-08-23 | Discharge: 2023-08-23 | Disposition: A | Discharge status: Home / Self Care | Payer: 59 | Attending: Cardiology | Admitting: Cardiology

## 2023-08-23 VITALS — BP 94/50 | HR 74 | Wt 214.4 lb

## 2023-08-23 DIAGNOSIS — I5022 Chronic systolic (congestive) heart failure: Secondary | ICD-10-CM | POA: Insufficient documentation

## 2023-08-23 MED ORDER — METOPROLOL SUCCINATE ER 25 MG PO TB24
25.0000 mg | ORAL_TABLET | Freq: Every day | ORAL | 3 refills | Status: DC
  Filled 2023-08-23: qty 90, 90d supply, fill #0
  Filled 2024-01-05 (×2): qty 90, 90d supply, fill #1

## 2023-08-23 MED ORDER — SACUBITRIL-VALSARTAN 24-26 MG PO TABS
1.0000 | ORAL_TABLET | Freq: Two times a day (BID) | ORAL | 11 refills | Status: DC
  Filled 2023-08-23: qty 60, 30d supply, fill #0

## 2023-08-23 MED ORDER — ATORVASTATIN CALCIUM 80 MG PO TABS
80.0000 mg | ORAL_TABLET | Freq: Every day | ORAL | 3 refills | Status: DC
  Filled 2023-08-23: qty 90, 90d supply, fill #0

## 2023-08-23 MED ORDER — SPIRONOLACTONE 25 MG PO TABS
25.0000 mg | ORAL_TABLET | Freq: Every day | ORAL | 3 refills | Status: DC
  Filled 2023-08-23: qty 90, 90d supply, fill #0

## 2023-08-23 MED ORDER — RIVAROXABAN 20 MG PO TABS
20.0000 mg | ORAL_TABLET | Freq: Every day | ORAL | 3 refills | Status: DC
  Filled 2023-08-23: qty 90, 90d supply, fill #0

## 2023-08-23 MED ORDER — AMIODARONE HCL 200 MG PO TABS
ORAL_TABLET | ORAL | 3 refills | Status: DC
  Filled 2023-08-23: qty 200, 100d supply, fill #0

## 2023-08-23 MED ORDER — BREZTRI AEROSPHERE 160-9-4.8 MCG/ACT IN AERO
2.0000 | INHALATION_SPRAY | Freq: Two times a day (BID) | RESPIRATORY_TRACT | 0 refills | Status: AC
  Filled 2023-08-23: qty 10.7, 30d supply, fill #0

## 2023-08-23 MED ORDER — DAPAGLIFLOZIN PROPANEDIOL 10 MG PO TABS
10.0000 mg | ORAL_TABLET | Freq: Every day | ORAL | 3 refills | Status: DC
  Filled 2023-08-23: qty 90, 90d supply, fill #0

## 2023-08-23 MED ORDER — TORSEMIDE 20 MG PO TABS
60.0000 mg | ORAL_TABLET | Freq: Two times a day (BID) | ORAL | 3 refills | Status: DC
  Filled 2023-08-23 – 2024-01-05 (×3): qty 180, 30d supply, fill #0

## 2023-08-23 NOTE — Patient Instructions (Addendum)
 PLEASE TAKE THE FOLLOWING MEDICATIONS.  Amiodarone  200 mg Twice daily for 2 weeks, THEN change to 2 tablets daily   Metoprolol  25 mg daily.  Farixga 10 mg daily.  Xarelto  20 mg daily.  Torsemide  60 mg ( 3 Tabs) daily.  Entresto  24-26 mg Twice daily  Spironolactone  25 mg ( 1 Tab) daily.  Lipitor  80 mg daily.  STOP TAKING YOUR Digoxin  and Asprin   Your physician recommends that you schedule a follow-up appointment in: 1 MONTH  If you have any questions or concerns before your next appointment please send us  a message through [place] or call our office at [phone].    TO LEAVE A MESSAGE FOR THE NURSE SELECT OPTION 2, PLEASE LEAVE A MESSAGE INCLUDING: YOUR NAME DATE OF BIRTH CALL BACK NUMBER REASON FOR CALL**this is important as we prioritize the call backs  YOU WILL RECEIVE A CALL BACK THE SAME DAY AS LONG AS YOU CALL BEFORE 4:00 PM  At the [facility], you and your health needs are our priority. As part of our continuing mission to provide you with exceptional heart care, we have created designated Provider Care Teams. These Care Teams include your primary Cardiologist (physician) and Advanced Practice Providers (APPs- Physician Assistants and Nurse Practitioners) who all work together to provide you with the care you need, when you need it.   You may see any of the following providers on your designated Care Team at your next follow up: Dr Toribio Fuel Dr Ezra Shuck Dr. Ria Commander Dr. Morene Brownie Amy Lenetta, NP Caffie Shed, [place] [facility], [place] Beckey Coe, NP Jordan Lee, NP Tinnie Redman, PharmD   Please be sure to bring in all your medications bottles to every appointment.    Thank you for choosing [facility]

## 2023-08-24 ENCOUNTER — Other Ambulatory Visit (HOSPITAL_COMMUNITY): Payer: Self-pay

## 2023-08-24 NOTE — Progress Notes (Signed)
 ADVANCED HEART FAILURE FOLLOW UP CLINIC NOTE  Referring Physician: Corrington, Kip A, MD  Primary Care: Corrington, Kip A, MD Primary Cardiologist:  HPI: Autumn [name] is a 68 y.o. female with a PMH of h/o systolic heart failure, CAD s/p prior DES to LAD, atrial fibrillation, mitral regurgitation and COPD who presents for follow up of chronic systolic HF.      Echo 6/22 EF 60-65%, RV normal.   Admitted 4/24 for acute CHF. Echo showed drop in EF down to 40-45%, diffuse HK, RV normal, calcified/degenerative mitral valve w/ mild MR. Of note, she was reported to be in afib w/ RVR during time of study, w/ v-rates in the 120s-130s. She was diuresed w/ IV Lasix  and metoprolol  was increased for rate control of afib.    Readmitted again 6/24 for symptomatic afib/ w/ RVR. Limited Echo EF further reduced down to 35-40%, mod-severe MR, RV moderately reduced, RVSP 43 mmHg. Also treated for a/c CHF. Diuresed and rate control regimen further titrated.    Readmitted again 7/24 for afib w/ RVR and a/c CHF. Repeat Limited Echo 10/24, EF 30-35%, global HK, severe LAE, mod-severe MR, small pericardial effusion localized near RA but no evidence of tamponade, mod TR. Digoxin  was added to regimen. Adequate rate control was noted to be achieved prior to d/c.    Readmitted again 10/24 for the same, a/c CHF and afib w/ RVR. Admitted by internal medicine.. Continued on high dose Toprol  XL 100 mg daily and referred to [facility] clinic at d/c.   Readmitted 07/2023 for atrial fibrillation, HF exacerbation, and sciatica pain. Cardioverted and underwent L/RHC with low cardiac output and moderate disease but no intervention. Did not take the majority of her meds on discharge.    SUBJECTIVE:  Patient reports that she was not given the majority of her medications at discharge and so has not been taking her amiodarone , Farxiga , digoxin , metoprolol  and Xarelto .  She reports that overall she is doing well, her breathing is at  its baseline and she denies any worsening lower extremity swelling.  She has been taking her remainder of her medications as ordered.  PMH, current medications, allergies, social history, and family history reviewed in epic.  PHYSICAL EXAM: Vitals:   08/23/23 1433  BP: (!) 94/50  Pulse: 74  SpO2: 97%   GENERAL: Chronically ill-appearing HEENT: The mucous membranes are pink and moist.   PULM:  Normal work of breathing, clear to auscultation bilaterally. Respirations are unlabored.  CARDIAC:  JVP: Mildly elevated         Irregular rate and rhythm, 1+ lower extremity edema ABDOMEN: Soft, non-tender, non-distended. NEUROLOGIC: Patient is oriented x3 with no focal or lateralizing neurologic deficits.  PSYCH: Patients affect is appropriate, there is no evidence of anxiety or depression.  SKIN: Warm and dry; no lesions or wounds. Warm and well perfused extremities.  DATA REVIEW  ECG: 08/2023: Atrial fibrillation with rates in the 100s.  ECHO: 08/10/2023: LVEF 25 to 30%, mild to moderate MR, small PFO  CATH: 08/09/2023: Proximal RCA 60%, D2 80%, LCx 75%, RA 18, PA 57/32 (43), PCWP 28, Fick CO 3.41, Fick CI 1.7   ASSESSMENT & PLAN:  Chronic systolic heart failure: Known ischemic disease, LAD PCI in 2010 with progressive disease on recent heart cath, though no intervention at this time given lack of anginal symptoms.  She is not an advanced therapies candidate despite her low cardiac index given her multi morbid conditions and poor medical literacy. -Continue spironolactone  12.5  mg daily, torsemide  60 milligrams twice daily, restart metoprolol  25 mg daily,  -If admitted would benefit from digoxin  level tomorrow morning -Management of atrial fibrillation as below -Will eventually likely need right/left heart catheterization   Atrial fibrillation with RVR: Unfortunately returned to atrial fibrillation in the setting of not taking her medications.  Increase stroke risk given recent  cardioversion, restarted medications today.  Will have close follow-up to ensure medication compliance before considering additional cardioversion given her difficulties in the past. -Continue diuresis as above -Continue metoprolol  XL 25 mg daily -Continue Xarelto  20 mg daily -Start amiodarone  400 mg twice daily for 14-day load -Stop digoxin  -Hold on scheduling additional cardioversion until we can ensure better medication adherence   Mitral Regurgitation  -Mild to moderate on recent TEE - suspect functional MR c/b persistent afib  - HF and Afib optimization per above    CAD  - s/p LAD PCI + DES at [facility] 04/2019  - she denies any recent CP  - on ASA, statin + ? blocker   Chronic Hypoxic Respiratory Failure/COPD  - on Home O2 2L/min baseline. O2 sats 94%  - referred to [employer]  - advised to quit smoking    Untreated OSA - reports prior diagnosis but not on CPAP     Morene Brownie, MD Advanced Heart Failure Mechanical Circulatory Support 08/24/23

## 2023-09-21 ENCOUNTER — Telehealth (HOSPITAL_COMMUNITY): Payer: Self-pay | Admitting: Cardiology

## 2023-09-22 ENCOUNTER — Encounter (HOSPITAL_COMMUNITY): Payer: 59 | Admitting: Cardiology

## 2023-09-22 NOTE — Progress Notes (Incomplete)
   ADVANCED HEART FAILURE FOLLOW UP CLINIC NOTE  Referring Physician: Vivien Presto, MD  Primary Care: Corrington, Meredith Mody, MD Primary Cardiologist:  HPI: Autumn [name] is a 68 y.o. female who presents for follow up of ***.      {Anything typed between these two boxes will persist and can be pulled forward to future notes. This phrase will delete itself when the note is signed :1}   @HCHISTORYNARRATIVE @     SUBJECTIVE:  PMH, current medications, allergies, social history, and family history reviewed in epic.  PHYSICAL EXAM: There were no vitals filed for this visit. GENERAL: Well nourished and in no apparent distress at rest.  PULM:  Normal work of breathing, clear to auscultation bilaterally. Respirations are unlabored.  CARDIAC:  JVP: ***         Normal rate with regular rhythm. No murmurs, rubs or gallops.  *** edema. Warm and well perfused extremities. ABDOMEN: Soft, non-tender, non-distended. NEUROLOGIC: Patient is oriented x3 with no focal or lateralizing neurologic deficits.    DATA REVIEW  ECG: 08/2023: Atrial fibrillation with rates in the 100s.   ECHO: 08/10/2023: LVEF 25 to 30%, mild to moderate MR, small PFO   CATH: 08/09/2023: Proximal RCA 60%, D2 80%, LCx 75%, RA 18, PA 57/32 (43), PCWP 28, Fick CO 3.41, Fick CI 1.7  Heart failure review: - Classification: {HFCLASS:30917} - Etiology: {Cardiomyopathy:30918} - NYHA Class:  - Volume status: {volumechf:30919} - ACEi/ARB/ARNI: {HF:30752} - Aldosterone antagonist: {HF:30752} - Beta-blocker: {HF:30752} - Digoxin: {HF:30752} - Hydralazine/Nitrates: {HF:30752} - SGLT2i: {HF:30752} - GLP-1: {GLP:30906} - Advanced therapies: {Advancedtherapies:30916} - ICD: {ICD:30901}  ASSESSMENT & PLAN:  Chronic systolic heart failure: Known ischemic disease, LAD PCI in 2010 with progressive disease on recent heart cath, though no intervention at this time given lack of anginal symptoms. ACC/AHA Stage D  cardiomyopathy. She is not an advanced therapies candidate despite her low cardiac index given her multi morbid conditions and poor medical literacy. -Continue spironolactone 12.5 mg daily, torsemide 60 milligrams twice daily, restart metoprolol 25 mg daily -Management of atrial fibrillation as below   Atrial fibrillation with RVR: Unfortunately returned to atrial fibrillation in the setting of not taking her medications.  Increase stroke risk given recent cardioversion, restarted medications today.  Will have close follow-up to ensure medication compliance before considering additional cardioversion given her difficulties in the past. -Continue diuresis as above -Continue metoprolol XL 25 mg daily -Continue Xarelto 20 mg daily -Start amiodarone 400 mg twice daily for 14-day load -Stop digoxin -Hold on scheduling additional cardioversion until we can ensure better medication adherence   Mitral Regurgitation  -Mild to moderate on recent TEE - suspect functional MR c/b persistent afib  - HF and Afib optimization per above    CAD  - s/p LAD PCI + DES at [facility] 04/2019  - she denies any recent CP  - on ASA, statin + ? blocker   Chronic Hypoxic Respiratory Failure/COPD  - on Home O2 2L/min baseline. O2 sats 94%  - referred to [employer]  - advised to quit smoking    Untreated OSA - reports prior diagnosis but not on CPAP   Follow up in ***  Clearnce Hasten, MD Advanced Heart Failure Mechanical Circulatory Support 09/22/23

## 2023-10-18 ENCOUNTER — Ambulatory Visit (HOSPITAL_COMMUNITY)
Admission: RE | Admit: 2023-10-18 | Discharge: 2023-10-18 | Disposition: A | Discharge status: Home / Self Care | Payer: 59 | Source: Ambulatory Visit | Attending: Cardiology | Admitting: Cardiology

## 2023-10-18 ENCOUNTER — Other Ambulatory Visit (HOSPITAL_BASED_OUTPATIENT_CLINIC_OR_DEPARTMENT_OTHER): Payer: Self-pay

## 2023-10-18 ENCOUNTER — Other Ambulatory Visit (HOSPITAL_COMMUNITY): Payer: Self-pay

## 2023-10-18 ENCOUNTER — Encounter (HOSPITAL_COMMUNITY): Payer: Self-pay | Admitting: Cardiology

## 2023-10-18 VITALS — BP 144/90 | HR 102 | Wt 210.4 lb

## 2023-10-18 DIAGNOSIS — J9610 Chronic respiratory failure, unspecified whether with hypoxia or hypercapnia: Secondary | ICD-10-CM | POA: Diagnosis not present

## 2023-10-18 DIAGNOSIS — I2510 Atherosclerotic heart disease of native coronary artery without angina pectoris: Secondary | ICD-10-CM | POA: Insufficient documentation

## 2023-10-18 DIAGNOSIS — I5022 Chronic systolic (congestive) heart failure: Secondary | ICD-10-CM | POA: Insufficient documentation

## 2023-10-18 DIAGNOSIS — I480 Paroxysmal atrial fibrillation: Secondary | ICD-10-CM | POA: Diagnosis not present

## 2023-10-18 DIAGNOSIS — Z9981 Dependence on supplemental oxygen: Secondary | ICD-10-CM | POA: Diagnosis not present

## 2023-10-18 DIAGNOSIS — Z955 Presence of coronary angioplasty implant and graft: Secondary | ICD-10-CM | POA: Insufficient documentation

## 2023-10-18 DIAGNOSIS — J42 Unspecified chronic bronchitis: Secondary | ICD-10-CM

## 2023-10-18 DIAGNOSIS — J449 Chronic obstructive pulmonary disease, unspecified: Secondary | ICD-10-CM | POA: Diagnosis not present

## 2023-10-18 DIAGNOSIS — Z7901 Long term (current) use of anticoagulants: Secondary | ICD-10-CM | POA: Diagnosis not present

## 2023-10-18 DIAGNOSIS — I739 Peripheral vascular disease, unspecified: Secondary | ICD-10-CM | POA: Diagnosis not present

## 2023-10-18 DIAGNOSIS — I4891 Unspecified atrial fibrillation: Secondary | ICD-10-CM | POA: Diagnosis not present

## 2023-10-18 DIAGNOSIS — G4733 Obstructive sleep apnea (adult) (pediatric): Secondary | ICD-10-CM | POA: Insufficient documentation

## 2023-10-18 DIAGNOSIS — Z79899 Other long term (current) drug therapy: Secondary | ICD-10-CM | POA: Diagnosis not present

## 2023-10-18 DIAGNOSIS — I340 Nonrheumatic mitral (valve) insufficiency: Secondary | ICD-10-CM | POA: Diagnosis not present

## 2023-10-18 MED ORDER — DAPAGLIFLOZIN PROPANEDIOL 10 MG PO TABS
10.0000 mg | ORAL_TABLET | Freq: Every day | ORAL | 3 refills | Status: DC
  Filled 2023-10-18: qty 90, 90d supply, fill #0

## 2023-10-18 MED ORDER — SPIRONOLACTONE 25 MG PO TABS
25.0000 mg | ORAL_TABLET | Freq: Every day | ORAL | 3 refills | Status: DC
  Filled 2023-10-18 – 2024-01-05 (×3): qty 90, 90d supply, fill #0

## 2023-10-18 NOTE — Progress Notes (Signed)
 ADVANCED HEART FAILURE FOLLOW UP CLINIC NOTE  Referring Physician: Vivien Presto, MD  Primary Care: Corrington, Meredith Mody, MD Primary Cardiologist:  HPI: Autumn [name] is a 68 y.o. female PMH of h/o systolic heart failure, CAD s/p prior DES to LAD, atrial fibrillation, mitral regurgitation and COPD who presents for follow up of chronic systolic HF      Echo 6/22 EF 96-29%, RV normal.   Admitted 4/24 for acute CHF. Echo showed drop in EF down to 40-45%, diffuse HK, RV normal, calcified/degenerative mitral valve w/ mild MR. Of note, she was reported to be in afib w/ RVR during time of study, w/ v-rates in the 120s-130s. She was diuresed w/ IV Lasix and metoprolol was increased for rate control of afib.    Readmitted again 6/24 for symptomatic afib/ w/ RVR. Limited Echo EF further reduced down to 35-40%, mod-severe MR, RV moderately reduced, RVSP 43 mmHg. Also treated for a/c CHF. Diuresed and rate control regimen further titrated.    Readmitted again 7/24 for afib w/ RVR and a/c CHF. Repeat Limited Echo 10/24, EF 30-35%, global HK, severe LAE, mod-severe MR, small pericardial effusion localized near RA but no evidence of tamponade, mod TR. Digoxin was added to regimen. Adequate rate control was noted to be achieved prior to d/c.    Readmitted again 10/24 for the same, a/c CHF and afib w/ RVR. Admitted by internal medicine.. Continued on high dose Toprol XL 100 mg daily and referred to [facility] clinic at d/c.    Readmitted 07/2023 for atrial fibrillation, HF exacerbation, and sciatica pain. Cardioverted and underwent L/RHC with low cardiac output and moderate disease but no intervention. Did not take the majority of her meds on discharge.     SUBJECTIVE:  Overall doing fairly well, chief complaint is sciatica pain. She notes that her breathing is at her baseline, no recent issues with fluid retention, and no further chest pain.  1 PMH, current medications, allergies, social history, and  family history reviewed in epic.  PHYSICAL EXAM: Vitals:   10/18/23 1204  BP: (!) 144/90  Pulse: (!) 102  SpO2: 98%   GENERAL: Well nourished and in no apparent distress at rest.  PULM:  Normal work of breathing, clear to auscultation bilaterally. Respirations are unlabored.  CARDIAC:  JVP: mildly elevated        Normal rate and regular rhythm. No murmurs, rubs or gallops.  1+ LE edema. Warm and well perfused extremities. ABDOMEN: Soft, non-tender, non-distended. NEUROLOGIC: Patient is oriented x3 with no focal or lateralizing neurologic deficits.    DATA REVIEW  ECG: 08/2023: Atrial fibrillation with rates in the 100s.   ECHO: 08/10/2023: LVEF 25 to 30%, mild to moderate MR, small PFO   CATH: 08/09/2023: Proximal RCA 60%, D2 80%, LCx 75%, RA 18, PA 57/32 (43), PCWP 28, Fick CO 3.41, Fick CI 1.7   ASSESSMENT & PLAN:  Chronic systolic heart failure: Known ischemic disease, LAD PCI in 2010 with progressive disease on recent heart cath, though no intervention at this time given lack of anginal symptoms. ACC/AHA Stage D cardiomyopathy. She is not an advanced therapies candidate despite her low cardiac index given her multi morbid conditions and poor medical literacy. -Continue spironolactone 12.5 mg daily, torsemide 60 milligrams twice daily, metoprolol 25 mg daily - Continue entresto 24/26mg  BID -Continue farxiga 10mg  daily   Atrial fibrillation with RVR: Unfortunately returned to atrial fibrillation in the setting of not taking her medications. Given the degree of cardiomyopathy,  pulmonary disease, and unclear compliance rhythm control may not be an achievable strategy. Will continue amidarone for now, but likely discontinue at next visit.  -Continue diuresis as above -Continue metoprolol XL 25 mg daily -Continue Xarelto 20 mg daily -Continue amiodarone 200mg  daily   Mitral Regurgitation  -Mild to moderate on recent TEE - suspect functional MR c/b persistent afib  - HF and  Afib optimization per above    CAD  - s/p LAD PCI + DES at [facility] 04/2019  - she denies any recent CP  - on ASA, statin + ? blocker   Chronic Hypoxic Respiratory Failure/COPD  - on Home O2 2L/min baseline. O2 sats 94%  - referred to [employer]  - advised to quit smoking    Untreated OSA - reports prior diagnosis but not on CPAP   Follow up in 3 months  Clearnce Hasten, MD Advanced Heart Failure Mechanical Circulatory Support 10/18/23

## 2023-10-18 NOTE — Patient Instructions (Signed)
 There has been no changes to your medications.  Your physician recommends that you schedule a follow-up appointment in: 4 months.( June) ** PLEASE CALL THE OFFICE IN [place] TO ARRANGE YOUR FOLLOW UP APPOINTMENT.**  If you have any questions or concerns before your next appointment please send [place] a message through [place] or call our office at [phone].    TO LEAVE A MESSAGE FOR THE NURSE SELECT OPTION 2, PLEASE LEAVE A MESSAGE INCLUDING: YOUR NAME DATE OF BIRTH CALL BACK NUMBER REASON FOR CALL**this is important as we prioritize the call backs  YOU WILL RECEIVE A CALL BACK THE SAME DAY AS LONG AS YOU CALL BEFORE 4:00 PM  At the [facility], you and your health needs are our priority. As part of our continuing mission to provide you with exceptional heart care, we have created designated Provider Care Teams. These Care Teams include your primary Cardiologist (physician) and Advanced Practice Providers (APPs- Physician Assistants and Nurse Practitioners) who all work together to provide you with the care you need, when you need it.   You may see any of the following providers on your designated Care Team at your next follow up: Dr Arvilla Meres Dr Marca Ancona Dr. Dorthula Nettles Dr. Clearnce Hasten Amy Filbert Schilder, NP Robbie Lis, [place] [facility] [place], [place] Brynda Peon, NP [place] Lee, NP Clarisa Kindred, NP Karle Plumber, PharmD Enos Fling, PharmD   Please be sure to bring in all your medications bottles to every appointment.    Thank you for choosing [facility]

## 2023-10-19 ENCOUNTER — Other Ambulatory Visit: Payer: Self-pay

## 2024-01-05 ENCOUNTER — Other Ambulatory Visit (HOSPITAL_COMMUNITY): Payer: Self-pay

## 2024-01-05 ENCOUNTER — Other Ambulatory Visit: Payer: Self-pay

## 2024-01-06 ENCOUNTER — Other Ambulatory Visit (HOSPITAL_COMMUNITY): Payer: Self-pay

## 2024-01-13 ENCOUNTER — Telehealth (HOSPITAL_COMMUNITY): Payer: Self-pay | Admitting: Cardiology

## 2024-01-13 NOTE — Telephone Encounter (Signed)
 Called to confirm/remind patient of their appointment at the Advanced Heart Failure Clinic on 01/13/2024.   Appointment:   [] Confirmed  [x] Left mess   [] No answer/No voice mail  [] VM Full/unable to leave message  [] Phone not in service  Patient reminded to bring all medications and/or complete list.  Confirmed patient has transportation. Gave directions, instructed to utilize valet parking.

## 2024-01-14 ENCOUNTER — Encounter (HOSPITAL_COMMUNITY): Admitting: Cardiology

## 2024-01-17 ENCOUNTER — Encounter (HOSPITAL_COMMUNITY): Payer: Self-pay

## 2024-01-17 ENCOUNTER — Other Ambulatory Visit: Payer: Self-pay

## 2024-01-17 ENCOUNTER — Emergency Department (HOSPITAL_COMMUNITY)

## 2024-01-17 ENCOUNTER — Inpatient Hospital Stay (HOSPITAL_COMMUNITY)
Admission: EM | Admit: 2024-01-17 | Discharge: 2024-01-29 | DRG: 323 | Disposition: A | Discharge status: Home Health | Attending: Internal Medicine | Admitting: Internal Medicine

## 2024-01-17 DIAGNOSIS — I472 Ventricular tachycardia, unspecified: Secondary | ICD-10-CM | POA: Diagnosis not present

## 2024-01-17 DIAGNOSIS — Z79899 Other long term (current) drug therapy: Secondary | ICD-10-CM

## 2024-01-17 DIAGNOSIS — Z7901 Long term (current) use of anticoagulants: Secondary | ICD-10-CM

## 2024-01-17 DIAGNOSIS — M797 Fibromyalgia: Secondary | ICD-10-CM | POA: Diagnosis present

## 2024-01-17 DIAGNOSIS — F17210 Nicotine dependence, cigarettes, uncomplicated: Secondary | ICD-10-CM | POA: Diagnosis present

## 2024-01-17 DIAGNOSIS — Z881 Allergy status to other antibiotic agents status: Secondary | ICD-10-CM

## 2024-01-17 DIAGNOSIS — Z6835 Body mass index (BMI) 35.0-35.9, adult: Secondary | ICD-10-CM

## 2024-01-17 DIAGNOSIS — I493 Ventricular premature depolarization: Secondary | ICD-10-CM | POA: Diagnosis not present

## 2024-01-17 DIAGNOSIS — R1110 Vomiting, unspecified: Secondary | ICD-10-CM | POA: Diagnosis not present

## 2024-01-17 DIAGNOSIS — R54 Age-related physical debility: Secondary | ICD-10-CM | POA: Diagnosis present

## 2024-01-17 DIAGNOSIS — Z9181 History of falling: Secondary | ICD-10-CM

## 2024-01-17 DIAGNOSIS — F1510 Other stimulant abuse, uncomplicated: Secondary | ICD-10-CM | POA: Diagnosis present

## 2024-01-17 DIAGNOSIS — R9431 Abnormal electrocardiogram [ECG] [EKG]: Secondary | ICD-10-CM | POA: Diagnosis present

## 2024-01-17 DIAGNOSIS — M5442 Lumbago with sciatica, left side: Secondary | ICD-10-CM | POA: Diagnosis present

## 2024-01-17 DIAGNOSIS — Z91138 Patient's unintentional underdosing of medication regimen for other reason: Secondary | ICD-10-CM

## 2024-01-17 DIAGNOSIS — Z87898 Personal history of other specified conditions: Secondary | ICD-10-CM

## 2024-01-17 DIAGNOSIS — G8929 Other chronic pain: Secondary | ICD-10-CM | POA: Diagnosis present

## 2024-01-17 DIAGNOSIS — G928 Other toxic encephalopathy: Secondary | ICD-10-CM | POA: Diagnosis present

## 2024-01-17 DIAGNOSIS — J449 Chronic obstructive pulmonary disease, unspecified: Secondary | ICD-10-CM | POA: Diagnosis present

## 2024-01-17 DIAGNOSIS — R443 Hallucinations, unspecified: Secondary | ICD-10-CM | POA: Diagnosis not present

## 2024-01-17 DIAGNOSIS — M545 Low back pain, unspecified: Secondary | ICD-10-CM | POA: Diagnosis present

## 2024-01-17 DIAGNOSIS — F1410 Cocaine abuse, uncomplicated: Secondary | ICD-10-CM | POA: Diagnosis present

## 2024-01-17 DIAGNOSIS — I9589 Other hypotension: Secondary | ICD-10-CM | POA: Diagnosis present

## 2024-01-17 DIAGNOSIS — I4821 Permanent atrial fibrillation: Principal | ICD-10-CM | POA: Diagnosis present

## 2024-01-17 DIAGNOSIS — Z716 Tobacco abuse counseling: Secondary | ICD-10-CM

## 2024-01-17 DIAGNOSIS — I429 Cardiomyopathy, unspecified: Secondary | ICD-10-CM | POA: Diagnosis present

## 2024-01-17 DIAGNOSIS — I4819 Other persistent atrial fibrillation: Secondary | ICD-10-CM | POA: Diagnosis present

## 2024-01-17 DIAGNOSIS — E669 Obesity, unspecified: Secondary | ICD-10-CM | POA: Diagnosis present

## 2024-01-17 DIAGNOSIS — I2510 Atherosclerotic heart disease of native coronary artery without angina pectoris: Secondary | ICD-10-CM | POA: Diagnosis present

## 2024-01-17 DIAGNOSIS — Z7951 Long term (current) use of inhaled steroids: Secondary | ICD-10-CM

## 2024-01-17 DIAGNOSIS — M25552 Pain in left hip: Secondary | ICD-10-CM | POA: Diagnosis not present

## 2024-01-17 DIAGNOSIS — T50916A Underdosing of multiple unspecified drugs, medicaments and biological substances, initial encounter: Secondary | ICD-10-CM | POA: Diagnosis present

## 2024-01-17 DIAGNOSIS — N179 Acute kidney failure, unspecified: Secondary | ICD-10-CM | POA: Diagnosis not present

## 2024-01-17 DIAGNOSIS — G4733 Obstructive sleep apnea (adult) (pediatric): Secondary | ICD-10-CM | POA: Diagnosis present

## 2024-01-17 DIAGNOSIS — J441 Chronic obstructive pulmonary disease with (acute) exacerbation: Secondary | ICD-10-CM

## 2024-01-17 DIAGNOSIS — Z955 Presence of coronary angioplasty implant and graft: Secondary | ICD-10-CM

## 2024-01-17 DIAGNOSIS — F329 Major depressive disorder, single episode, unspecified: Secondary | ICD-10-CM | POA: Diagnosis present

## 2024-01-17 DIAGNOSIS — I739 Peripheral vascular disease, unspecified: Secondary | ICD-10-CM | POA: Diagnosis present

## 2024-01-17 DIAGNOSIS — F17290 Nicotine dependence, other tobacco product, uncomplicated: Secondary | ICD-10-CM | POA: Diagnosis present

## 2024-01-17 DIAGNOSIS — I5023 Acute on chronic systolic (congestive) heart failure: Secondary | ICD-10-CM | POA: Diagnosis present

## 2024-01-17 DIAGNOSIS — Z888 Allergy status to other drugs, medicaments and biological substances status: Secondary | ICD-10-CM

## 2024-01-17 DIAGNOSIS — F1310 Sedative, hypnotic or anxiolytic abuse, uncomplicated: Secondary | ICD-10-CM | POA: Diagnosis present

## 2024-01-17 DIAGNOSIS — I4891 Unspecified atrial fibrillation: Principal | ICD-10-CM

## 2024-01-17 DIAGNOSIS — I4729 Other ventricular tachycardia: Secondary | ICD-10-CM | POA: Diagnosis not present

## 2024-01-17 DIAGNOSIS — Z7902 Long term (current) use of antithrombotics/antiplatelets: Secondary | ICD-10-CM

## 2024-01-17 DIAGNOSIS — M48061 Spinal stenosis, lumbar region without neurogenic claudication: Secondary | ICD-10-CM | POA: Diagnosis present

## 2024-01-17 DIAGNOSIS — I4721 Torsades de pointes: Secondary | ICD-10-CM | POA: Diagnosis not present

## 2024-01-17 DIAGNOSIS — E785 Hyperlipidemia, unspecified: Secondary | ICD-10-CM | POA: Diagnosis present

## 2024-01-17 DIAGNOSIS — Q2112 Patent foramen ovale: Secondary | ICD-10-CM

## 2024-01-17 DIAGNOSIS — E876 Hypokalemia: Secondary | ICD-10-CM | POA: Diagnosis present

## 2024-01-17 DIAGNOSIS — T424X1A Poisoning by benzodiazepines, accidental (unintentional), initial encounter: Secondary | ICD-10-CM | POA: Diagnosis present

## 2024-01-17 DIAGNOSIS — F988 Other specified behavioral and emotional disorders with onset usually occurring in childhood and adolescence: Secondary | ICD-10-CM | POA: Diagnosis present

## 2024-01-17 DIAGNOSIS — M5416 Radiculopathy, lumbar region: Secondary | ICD-10-CM | POA: Diagnosis present

## 2024-01-17 DIAGNOSIS — F411 Generalized anxiety disorder: Secondary | ICD-10-CM | POA: Diagnosis present

## 2024-01-17 DIAGNOSIS — R008 Other abnormalities of heart beat: Secondary | ICD-10-CM | POA: Diagnosis not present

## 2024-01-17 DIAGNOSIS — J9611 Chronic respiratory failure with hypoxia: Secondary | ICD-10-CM | POA: Diagnosis present

## 2024-01-17 DIAGNOSIS — I4901 Ventricular fibrillation: Secondary | ICD-10-CM | POA: Diagnosis not present

## 2024-01-17 DIAGNOSIS — Z9981 Dependence on supplemental oxygen: Secondary | ICD-10-CM

## 2024-01-17 DIAGNOSIS — F17200 Nicotine dependence, unspecified, uncomplicated: Secondary | ICD-10-CM | POA: Diagnosis present

## 2024-01-17 DIAGNOSIS — Z7984 Long term (current) use of oral hypoglycemic drugs: Secondary | ICD-10-CM

## 2024-01-17 DIAGNOSIS — R001 Bradycardia, unspecified: Secondary | ICD-10-CM | POA: Diagnosis not present

## 2024-01-17 DIAGNOSIS — I340 Nonrheumatic mitral (valve) insufficiency: Secondary | ICD-10-CM | POA: Diagnosis present

## 2024-01-17 DIAGNOSIS — F1910 Other psychoactive substance abuse, uncomplicated: Secondary | ICD-10-CM | POA: Diagnosis present

## 2024-01-17 LAB — CBC
HCT: 36.5 % (ref 36.0–46.0)
Hemoglobin: 12 g/dL (ref 12.0–15.0)
MCH: 29.9 pg (ref 26.0–34.0)
MCHC: 32.9 g/dL (ref 30.0–36.0)
MCV: 91 fL (ref 80.0–100.0)
Platelets: 316 10*3/uL (ref 150–400)
RBC: 4.01 MIL/uL (ref 3.87–5.11)
RDW: 14.6 % (ref 11.5–15.5)
WBC: 7.6 10*3/uL (ref 4.0–10.5)
nRBC: 0 % (ref 0.0–0.2)

## 2024-01-17 LAB — COMPREHENSIVE METABOLIC PANEL WITH GFR
ALT: 68 U/L — ABNORMAL HIGH (ref 0–44)
AST: 34 U/L (ref 15–41)
Albumin: 2.7 g/dL — ABNORMAL LOW (ref 3.5–5.0)
Alkaline Phosphatase: 60 U/L (ref 38–126)
Anion gap: 9 (ref 5–15)
BUN: 8 mg/dL (ref 8–23)
CO2: 31 mmol/L (ref 22–32)
Calcium: 8.5 mg/dL — ABNORMAL LOW (ref 8.9–10.3)
Chloride: 97 mmol/L — ABNORMAL LOW (ref 98–111)
Creatinine, Ser: 0.72 mg/dL (ref 0.44–1.00)
GFR, Estimated: >60 mL/min (ref >60)
Glucose, Bld: 109 mg/dL — ABNORMAL HIGH (ref 70–99)
Potassium: 3.8 mmol/L (ref 3.5–5.1)
Sodium: 137 mmol/L (ref 135–145)
Total Bilirubin: 1 mg/dL (ref 0.0–1.2)
Total Protein: 6 g/dL — ABNORMAL LOW (ref 6.5–8.1)

## 2024-01-17 LAB — URINALYSIS, ROUTINE W REFLEX MICROSCOPIC
Bilirubin Urine: NEGATIVE
Glucose, UA: NEGATIVE mg/dL
Hgb urine dipstick: NEGATIVE
Ketones, ur: NEGATIVE mg/dL
Leukocytes,Ua: NEGATIVE
Nitrite: NEGATIVE
Protein, ur: NEGATIVE mg/dL
Specific Gravity, Urine: 1.015 (ref 1.005–1.030)
pH: 6 (ref 5.0–8.0)

## 2024-01-17 LAB — I-STAT VENOUS BLOOD GAS, ED
Acid-Base Excess: 10 mmol/L — ABNORMAL HIGH (ref 0.0–2.0)
Bicarbonate: 36.3 mmol/L — ABNORMAL HIGH (ref 20.0–28.0)
Calcium, Ion: 1.1 mmol/L — ABNORMAL LOW (ref 1.15–1.40)
HCT: 36 % (ref 36.0–46.0)
Hemoglobin: 12.2 g/dL (ref 12.0–15.0)
O2 Saturation: 100 %
Potassium: 3.2 mmol/L — ABNORMAL LOW (ref 3.5–5.1)
Sodium: 139 mmol/L (ref 135–145)
TCO2: 38 mmol/L — ABNORMAL HIGH (ref 22–32)
pCO2, Ven: 53.5 mmHg (ref 44–60)
pH, Ven: 7.44 — ABNORMAL HIGH (ref 7.25–7.43)
pO2, Ven: 197 mmHg — ABNORMAL HIGH (ref 32–45)

## 2024-01-17 LAB — RAPID URINE DRUG SCREEN, HOSP PERFORMED
Amphetamines: POSITIVE — AB
Barbiturates: NOT DETECTED
Benzodiazepines: POSITIVE — AB
Cocaine: NOT DETECTED
Opiates: NOT DETECTED
Tetrahydrocannabinol: POSITIVE — AB

## 2024-01-17 LAB — HIV ANTIBODY (ROUTINE TESTING W REFLEX): HIV Screen 4th Generation wRfx: NONREACTIVE

## 2024-01-17 LAB — BRAIN NATRIURETIC PEPTIDE: B Natriuretic Peptide: 826.3 pg/mL — ABNORMAL HIGH (ref 0.0–100.0)

## 2024-01-17 MED ORDER — IPRATROPIUM BROMIDE 0.02 % IN SOLN
0.5000 mg | Freq: Once | RESPIRATORY_TRACT | Status: AC
  Administered 2024-01-17: 0.5 mg via RESPIRATORY_TRACT
  Filled 2024-01-17: qty 2.5

## 2024-01-17 MED ORDER — METHYLPREDNISOLONE SODIUM SUCC 125 MG IJ SOLR
125.0000 mg | Freq: Once | INTRAMUSCULAR | Status: AC
  Administered 2024-01-17: 125 mg via INTRAVENOUS
  Filled 2024-01-17: qty 2

## 2024-01-17 MED ORDER — IPRATROPIUM-ALBUTEROL 0.5-2.5 (3) MG/3ML IN SOLN
3.0000 mL | Freq: Four times a day (QID) | RESPIRATORY_TRACT | Status: DC | PRN
  Administered 2024-01-19: 3 mL via RESPIRATORY_TRACT
  Filled 2024-01-17: qty 3

## 2024-01-17 MED ORDER — ALBUTEROL SULFATE (2.5 MG/3ML) 0.083% IN NEBU
5.0000 mg | INHALATION_SOLUTION | Freq: Once | RESPIRATORY_TRACT | Status: AC
  Administered 2024-01-17: 5 mg via RESPIRATORY_TRACT
  Filled 2024-01-17: qty 6

## 2024-01-17 MED ORDER — DILTIAZEM HCL-DEXTROSE 125-5 MG/125ML-% IV SOLN (PREMIX)
5.0000 mg/h | INTRAVENOUS | Status: DC
  Administered 2024-01-17: 5 mg/h via INTRAVENOUS
  Administered 2024-01-17 – 2024-01-18 (×2): 12.5 mg/h via INTRAVENOUS
  Filled 2024-01-17 (×3): qty 125

## 2024-01-17 MED ORDER — ACETAMINOPHEN 650 MG RE SUPP: 650.0000 mg | Freq: Four times a day (QID) | RECTAL | Status: DC | PRN

## 2024-01-17 MED ORDER — POLYETHYLENE GLYCOL 3350 17 G PO PACK
17.0000 g | PACK | Freq: Every day | ORAL | Status: DC | PRN
  Administered 2024-01-24: 17 g via ORAL
  Filled 2024-01-17 (×2): qty 1

## 2024-01-17 MED ORDER — LIDOCAINE 5 % EX PTCH
1.0000 | MEDICATED_PATCH | CUTANEOUS | Status: DC
  Administered 2024-01-17 – 2024-01-28 (×11): 1 via TRANSDERMAL
  Filled 2024-01-17 (×11): qty 1

## 2024-01-17 MED ORDER — ACETAMINOPHEN 325 MG PO TABS
650.0000 mg | ORAL_TABLET | Freq: Four times a day (QID) | ORAL | Status: DC
  Administered 2024-01-17 – 2024-01-25 (×29): 650 mg via ORAL
  Filled 2024-01-17 (×30): qty 2

## 2024-01-17 MED ORDER — RIVAROXABAN 20 MG PO TABS
20.0000 mg | ORAL_TABLET | Freq: Every day | ORAL | Status: DC
Start: 2024-01-17 — End: 2024-01-23
  Administered 2024-01-17 – 2024-01-22 (×6): 20 mg via ORAL
  Filled 2024-01-17: qty 1
  Filled 2024-01-17: qty 2
  Filled 2024-01-17 (×4): qty 1

## 2024-01-17 MED ORDER — DILTIAZEM LOAD VIA INFUSION
10.0000 mg | Freq: Once | INTRAVENOUS | Status: AC
  Administered 2024-01-17: 10 mg via INTRAVENOUS
  Filled 2024-01-17: qty 10

## 2024-01-17 MED ORDER — POTASSIUM CHLORIDE CRYS ER 20 MEQ PO TBCR
40.0000 meq | EXTENDED_RELEASE_TABLET | Freq: Once | ORAL | Status: AC
  Administered 2024-01-17: 40 meq via ORAL
  Filled 2024-01-17: qty 2

## 2024-01-17 MED ORDER — ACETAMINOPHEN 325 MG PO TABS: 650.0000 mg | ORAL_TABLET | Freq: Four times a day (QID) | ORAL | Status: DC | PRN

## 2024-01-17 MED ORDER — NICOTINE 21 MG/24HR TD PT24
21.0000 mg | MEDICATED_PATCH | Freq: Every day | TRANSDERMAL | Status: DC
  Administered 2024-01-17 – 2024-01-29 (×13): 21 mg via TRANSDERMAL
  Filled 2024-01-17 (×13): qty 1

## 2024-01-17 MED ORDER — FUROSEMIDE 10 MG/ML IJ SOLN
40.0000 mg | Freq: Once | INTRAMUSCULAR | Status: AC
  Administered 2024-01-17: 40 mg via INTRAVENOUS
  Filled 2024-01-17: qty 4

## 2024-01-17 NOTE — ED Notes (Signed)
 Primary leads switched, cardiac monitor still reads "ECG Leads off."

## 2024-01-17 NOTE — ED Notes (Addendum)
 error

## 2024-01-17 NOTE — ED Provider Notes (Signed)
 [facility] Provider Note   CSN: [id number] Arrival date & time: 01/17/24  6440     History  Chief Complaint  Patient presents with   Hip Pain   Atrial Fibrillation    Autumn [name] is a 68 y.o. female.  Pt with hx copd, afib, presents c/o left hip pain progressive worse in past several weeks/months. Constant, dull, non radiating, worse w weight bearing. Denies recent trauma/fall. No knee pain. Denies leg swelling. No numbness/weakness. Also noted to be in afib/rvr. Pt unaware of palpitations. Is on xarelto . Pt indicates taking meds inconsistently, hx same. No chest pain. No new or worsening sob - uses home o2 prn. Bil leg swelling, pt indicates chronic, ?worse. No pnd. No fever or chills.   The history is provided by the patient and medical records. The history is limited by the condition of the patient.  Hip Pain Pertinent negatives include no chest pain, no abdominal pain and no headaches.       Home Medications Prior to Admission medications   Medication Sig Start Date End Date Taking? Authorizing Provider  albuterol  (VENTOLIN  HFA) 108 (90 Base) MCG/ACT inhaler Inhale 2 puffs into the lungs every 4 (four) hours as needed for wheezing. 05/20/23   [provider]  amiodarone  (PACERONE ) 200 MG tablet Take 1 tablet (200 mg total) by mouth 2 (two) times daily for 14 days, THEN 2 tablets (400 mg total) daily. 08/23/23 08/21/24  Lauralee Poll, MD  atorvastatin  (LIPITOR ) 80 MG tablet Take 1 tablet (80 mg total) by mouth daily. 08/23/23   Lauralee Poll, MD  Budeson-Glycopyrrol-Formoterol  (BREZTRI  AEROSPHERE) 160-9-4.8 MCG/ACT AERO Inhale 2 puffs into the lungs in the morning and at bedtime. 08/23/23   Lauralee Poll, MD  dapagliflozin  propanediol (FARXIGA ) 10 MG TABS tablet Take 1 tablet (10 mg total) by mouth daily. 10/18/23   Lauralee Poll, MD  gabapentin  (NEURONTIN ) 300 MG capsule Take 2 capsules (600 mg total) by  mouth 3 (three) times daily as needed (Takes 2-3 as needed). Patient not taking: Reported on 08/23/2023 06/09/23   Sheree Dieter, MD  methocarbamol  (ROBAXIN ) 500 MG tablet Take 1 tablet (500 mg total) by mouth every 8 (eight) hours as needed for muscle spasms. 08/12/23   Deforest Fast, MD  metoprolol  succinate (TOPROL -XL) 25 MG 24 hr tablet Take 1 tablet (25 mg total) by mouth daily. Take with or immediately following a meal. 08/23/23   Lauralee Poll, MD  naloxone  (NARCAN ) nasal spray 4 mg/0.1 mL Place 4 mg into the nose as needed (overdose, as needed). 10/06/19   [provider]  nicotine  (NICODERM CQ  - DOSED IN MG/24 HOURS) 14 mg/24hr patch Place 1 patch (14 mg total) onto the skin daily. Patient not taking: Reported on 08/06/2023 06/10/23   Sheree Dieter, MD  pantoprazole  (PROTONIX ) 40 MG tablet Take 1 tablet (40 mg total) by mouth daily. Patient not taking: Reported on 08/23/2023 06/10/23   Sheree Dieter, MD  rivaroxaban  (XARELTO ) 20 MG TABS tablet Take 1 tablet (20 mg total) by mouth daily with supper. 08/23/23   Lauralee Poll, MD  sacubitril -valsartan  (ENTRESTO ) 24-26 MG Take 1 tablet by mouth 2 (two) times daily. 08/23/23   Lauralee Poll, MD  senna-docusate (SENOKOT-S) 8.6-50 MG tablet Take 1 tablet by mouth 2 (two) times daily. 08/12/23   Deforest Fast, MD  spironolactone  (ALDACTONE ) 25 MG tablet Take 1 tablet (25 mg total) by mouth daily. 10/18/23   Alease Amend,  Leia Pun, MD  torsemide  (DEMADEX ) 20 MG tablet Take 3 tablets (60 mg total) by mouth 2 (two) times daily. 08/23/23   Lauralee Poll, MD      Allergies    Pamelor [nortriptyline hcl] and Vibra-tab [doxycycline]    Review of Systems   Review of Systems  Constitutional:  Negative for chills and fever.  HENT:  Negative for sore throat.   Respiratory:  Negative for cough.   Cardiovascular:  Negative for chest pain.  Gastrointestinal:  Negative for abdominal pain, diarrhea and vomiting.   Genitourinary:  Negative for dysuria and flank pain.  Musculoskeletal:  Negative for back pain and neck pain.  Neurological:  Negative for headaches.    Physical Exam Updated Vital Signs BP 115/73   Pulse 95   Temp 97.7 F (36.5 C) (Temporal)   Resp 19   Ht 1.651 m (5\' 5" )   SpO2 98%   BMI 35.01 kg/m  Physical Exam Vitals and nursing note reviewed.  Constitutional:      Appearance: Normal appearance. She is well-developed.  HENT:     Head: Atraumatic.     Nose: Nose normal.     Mouth/Throat:     Mouth: Mucous membranes are moist.  Eyes:     General: No scleral icterus.    Conjunctiva/sclera: Conjunctivae normal.  Neck:     Trachea: No tracheal deviation.     Comments: Trachea midline. Thyroid not grossly enlarged or tender. No neck stiffness or rigidity.  Cardiovascular:     Rate and Rhythm: Tachycardia present. Rhythm irregular.     Pulses: Normal pulses.     Heart sounds: Normal heart sounds. No murmur heard.    No friction rub. No gallop.  Pulmonary:     Effort: Pulmonary effort is normal. No respiratory distress.     Breath sounds: Wheezing present.  Abdominal:     General: Bowel sounds are normal. There is no distension.     Palpations: Abdomen is soft.     Tenderness: There is no abdominal tenderness.  Genitourinary:    Comments: No cva tenderness.  Musculoskeletal:        General: No tenderness.     Cervical back: Normal range of motion and neck supple. No rigidity. No muscular tenderness.     Comments: Good passive rom left hip and knee without pain. Symmetric, bilateral leg swelling. No asymmetric LLE swelling or leg pain. LLE is of normal color and warmth w intact distal pulses.   Skin:    General: Skin is warm and dry.     Findings: No rash.  Neurological:     Mental Status: She is alert.     Comments: Pt slow to respond, mildly lethargic. Speech or not aphasic or dysarthric. Motor/sens grossly intact bil.   Psychiatric:        Mood and Affect:  Mood normal.     ED Results / Procedures / Treatments   Labs (all labs ordered are listed, but only abnormal results are displayed) Results for orders placed or performed during the hospital encounter of 01/17/24  CBC   Collection Time: 01/17/24 10:34 AM  Result Value Ref Range   WBC 7.6 4.0 - 10.5 K/uL   RBC 4.01 3.87 - 5.11 MIL/uL   Hemoglobin 12.0 12.0 - 15.0 g/dL   HCT 14.7 82.9 - 56.2 %   MCV 91.0 80.0 - 100.0 fL   MCH 29.9 26.0 - 34.0 pg   MCHC 32.9 30.0 - 36.0 g/dL  RDW 14.6 11.5 - 15.5 %   Platelets 316 150 - 400 K/uL   nRBC 0.0 0.0 - 0.2 %  Comprehensive metabolic panel with GFR   Collection Time: 01/17/24 10:34 AM  Result Value Ref Range   Sodium 137 135 - 145 mmol/L   Potassium 3.8 3.5 - 5.1 mmol/L   Chloride 97 (L) 98 - 111 mmol/L   CO2 31 22 - 32 mmol/L   Glucose, Bld 109 (H) 70 - 99 mg/dL   BUN 8 8 - 23 mg/dL   Creatinine, Ser 4.09 0.44 - 1.00 mg/dL   Calcium  8.5 (L) 8.9 - 10.3 mg/dL   Total Protein 6.0 (L) 6.5 - 8.1 g/dL   Albumin 2.7 (L) 3.5 - 5.0 g/dL   AST 34 15 - 41 U/L   ALT 68 (H) 0 - 44 U/L   Alkaline Phosphatase 60 38 - 126 U/L   Total Bilirubin 1.0 0.0 - 1.2 mg/dL   GFR, Estimated >81 >19 mL/min   Anion gap 9 5 - 15  Brain natriuretic peptide   Collection Time: 01/17/24 10:34 AM  Result Value Ref Range   B Natriuretic Peptide 826.3 (H) 0.0 - 100.0 pg/mL      EKG EKG Interpretation Date/Time:  Monday January 17 2024 09:57:24 EDT Ventricular Rate:  133 PR Interval:    QRS Duration:  94 QT Interval:  356 QTC Calculation: 529 R Axis:   59  Text Interpretation: Atrial fibrillation with rapid ventricular response Nonspecific ST and T wave abnormality Confirmed by Guadalupe Lee ([id number]) on 01/17/2024 10:11:34 AM  Radiology DG Chest 1 View Result Date: 01/17/2024 CLINICAL DATA:  Fall. EXAM: CHEST  1 VIEW COMPARISON:  08/09/2023. FINDINGS: Cardiomegaly. Aortic atherosclerosis. No focal consolidation, sizeable pleural effusion, or pneumothorax. No  acute osseous abnormality identified. IMPRESSION: 1. No acute findings in the chest. 2. Cardiomegaly. Electronically Signed   By: Mannie Seek M.D.   On: 01/17/2024 12:49   DG HIP UNILAT W OR W/O PELVIS 2-3 VIEWS LEFT Result Date: 01/17/2024 CLINICAL DATA:  Pain after fall. EXAM: DG HIP (WITH OR WITHOUT PELVIS) 2-3V LEFT COMPARISON:  08/06/2023. FINDINGS: There is no evidence of acute fracture or dislocation. Femoral heads are seated within the acetabulum. Mild degenerative changes of the bilateral hips. Sacroiliac joints and pubic symphysis appear anatomically aligned. Degenerative changes of the visualized lower lumbar spine. IMPRESSION: No acute osseous abnormality. Electronically Signed   By: Mannie Seek M.D.   On: 01/17/2024 12:47    Procedures Procedures    Medications Ordered in ED Medications  diltiazem  (CARDIZEM ) 1 mg/mL load via infusion 10 mg (10 mg Intravenous Bolus from Bag 01/17/24 1041)    And  diltiazem  (CARDIZEM ) 125 mg in dextrose  5% 125 mL (1 mg/mL) infusion (5 mg/hr Intravenous New Bag/Given 01/17/24 1040)  furosemide  (LASIX ) injection 40 mg (has no administration in time range)  albuterol  (PROVENTIL ) (2.5 MG/3ML) 0.083% nebulizer solution 5 mg (has no administration in time range)  ipratropium (ATROVENT ) nebulizer solution 0.5 mg (has no administration in time range)  albuterol  (PROVENTIL ) (2.5 MG/3ML) 0.083% nebulizer solution 5 mg (5 mg Nebulization Given 01/17/24 1041)  ipratropium (ATROVENT ) nebulizer solution 0.5 mg (0.5 mg Nebulization Given 01/17/24 1041)    ED Course/ Medical Decision Making/ A&P                                 Medical Decision Making Problems Addressed: Acute on chronic systolic  CHF (congestive heart failure) (HCC): acute illness or injury with systemic symptoms that poses a threat to life or bodily functions Atrial fibrillation with rapid ventricular response (HCC): acute illness or injury with systemic symptoms that poses a threat to life  or bodily functions COPD exacerbation (HCC): acute illness or injury with systemic symptoms Left hip pain: chronic illness or injury  Amount and/or Complexity of Data Reviewed External Data Reviewed: notes. Labs: ordered. Decision-making details documented in ED Course. Radiology: ordered and independent interpretation performed. Decision-making details documented in ED Course. ECG/medicine tests: ordered and independent interpretation performed. Decision-making details documented in ED Course. Discussion of management or test interpretation with external provider(s): medicine  Risk Prescription drug management. Decision regarding hospitalization.   Iv ns. Continuous pulse ox and cardiac monitoring. Labs ordered/sent. Imaging ordered.   Differential diagnosis includes hip OA, fx, afib/rvr, etc. Dispo decision including potential need for admission considered - will get labs and imaging and reassess.   Reviewed nursing notes and prior charts for additional history. External reports reviewed.   Albuterol  and atrovent  neb.   Cardizem  bolus and gtt.  Pt w hx non compliance w meds, and not clear how long in afib, therefore not safe to pursue ED cardioversion.   Cardiac monitor:afib rate 130.   Labs reviewed/interpreted by me - wbc and hgb normal. Chem unremarkable. K normal. Bnp is high. Hx chf/leg edema. Lasix  dose iv.  Xrays reviewed/interpreted by me - no pna.   Persistent wheezing. Albuterol  and atrovent  neb. Solumedrol iv.   Hr improved w diltiazem  gtt/titrated.   Medicine consulted for admission.  CRITICAL CARE RE: atrial fib w rapid ventricular response requiring parenteral rate control therapy, copd/wheezing, chf.  Performed by: Lindzie Boxx E Ashonte Angelucci Total critical care time: 40 minutes Critical care time was exclusive of separately billable procedures and treating other patients. Critical care was necessary to treat or prevent imminent or life-threatening  deterioration. Critical care was time spent personally by me on the following activities: development of treatment plan with patient and/or surrogate as well as nursing, discussions with consultants, evaluation of patient's response to treatment, examination of patient, obtaining history from patient or surrogate, ordering and performing treatments and interventions, ordering and review of laboratory studies, ordering and review of radiographic studies, pulse oximetry and re-evaluation of patient's condition.  Pt with hx opiate use/chronic opiate use, ?hx sud, seems drowsy, easily aroused, but drifts back to sleep quickly.            Final Clinical Impression(s) / ED Diagnoses Final diagnoses:  Atrial fibrillation with rapid ventricular response (HCC)  Left hip pain  Acute on chronic systolic CHF (congestive heart failure) (HCC)  COPD exacerbation (HCC)    Rx / DC Orders ED Discharge Orders     None         Guadalupe Lee, MD 01/17/24 1328

## 2024-01-17 NOTE — ED Notes (Signed)
 Pt asleep and difficult to rouse for medication administration.

## 2024-01-17 NOTE — ED Notes (Signed)
 Pt is sitting on the bed, shouting and crying talking on her phone. Call light is within reach

## 2024-01-17 NOTE — ED Notes (Signed)
 Main cords changed, monitor reading pt rhythm, CCMD called.

## 2024-01-17 NOTE — ED Notes (Signed)
 Pt heard yelling from the nursing station. Upon arrival in the room, pt was at the bottom of the bed eating. Pt was assisted with repositioning and provided with grey purse per pt request.

## 2024-01-17 NOTE — H&P (Signed)
 Date: 01/17/2024               Patient Name:  Autumn [name] MRN: [id number]  DOB: November 23, 1955 Age / Sex: 68 y.o., female   PCP: Chandler Combs, MD         Medical Service: Internal Medicine Teaching Service         Attending Physician: Dr. Bevelyn Bryant, MD      First Contact 24/7: Dr Carleen Chary Pager:  [phone]  Second Contact 24/7: Dr Malen Scudder Pager:  [phone]   SUBJECTIVE   Chief Complaint: Hip pain  History of Present Illness: Autumn [name] is a 68 y.o. female with PMH of HFrEF, A-fib on Xarelto , attention deficit disorder, COPD, chronic hypoxic respiratory failure on 2 L [place], anxiety and depression, fibromyalgia, HLD, and CAD.   She presented to [facility] ED today with complaint of acute on chronic left hip pain and was subsequently found to be in A-fib with RVR and volume overloaded.  Regarding her hip pain, she describes 1-2 months of left lower back and hip pain that shoots to the lower leg and is a burning nature.  It is getting worse.  It originated when she had a fall.  She has trouble walking as a result.  She denies any recent trauma and maintains control of her bowels and bladder.  She is worried and very expressive about this pain, but also expresses a desire to go home.  She admits to a racing heart with irregular beats but is unable to identify when that started.  She has occasional dizziness and lightheadedness but it is not severe at present and she cannot describe its nature. She denies vision changes, chest pain.    She reports occasional dyspnea, but denies it at time of admission.  Most recent shortness of breath was yesterday.  She notes swelling of the lower extremities that is slowly progressed over 2 weeks.  She does not weigh herself and is not sure if she has gained any weight.  She has a history of recreational drug abuse.  She denies taking cocaine in over a month.  She states she consumed meth 1 week ago.  She states this morning and yesterday  she took "one quarter piece" of Xanax  which she obtained from the street.  She notes that she often misses doses of all of her medicines and most recently took her medicine yesterday.  She is not able to tell me what she took.  She appears to fall asleep often during the interview.  But she tells me she continues to listen.  At times she starts snoring.  She immediately awakes to sternal rub.  She is alert and oriented x 3.  She is a poor historian.  ED Course: Vitals were BP 115/73  Pulse 95  Temp 97.7 F (36.5 C) (Temporal)  Resp 19  Ht 1.651 m (5\' 5" )  SpO2 98%  BMI 35.01 kg/m  Labs significant for BNP 826 Imaging CXR and L hip XR non-acute Received Duonebs, Diltiazem  bolus/infusion,  Consulted IMTS for admission due to Afib RVR with concern of COPD exacerbation vs Acute HF and altered mental state with hypersomnolence  Past Medical History Past Medical History:  Diagnosis Date   Anxiety    Arthritis    Attention deficit disorder (ADD)    COPD (chronic obstructive pulmonary disease) (HCC)    Depression    Drug abuse (HCC)    Fibromyalgia    HFrEF (heart failure with  reduced ejection fraction) (HCC)    Hyperlipidemia    Paroxysmal Atrial fibrillation      Meds:  Pt is unable to confirm her outpatient medicines. She is prescribed: No current facility-administered medications on file prior to encounter.   Current Outpatient Medications on File Prior to Encounter  Medication Sig Dispense Refill   albuterol  (VENTOLIN  HFA) 108 (90 Base) MCG/ACT inhaler Inhale 2 puffs into the lungs every 4 (four) hours as needed for wheezing.     amiodarone  (PACERONE ) 200 MG tablet Take 1 tablet (200 mg total) by mouth 2 (two) times daily for 14 days, THEN 2 tablets (400 mg total) daily. 200 tablet 3   atorvastatin  (LIPITOR ) 80 MG tablet Take 1 tablet (80 mg total) by mouth daily. 90 tablet 3   Budeson-Glycopyrrol-Formoterol  (BREZTRI  AEROSPHERE) 160-9-4.8 MCG/ACT AERO Inhale 2 puffs into  the lungs in the morning and at bedtime. 10.7 g 0   dapagliflozin  propanediol (FARXIGA ) 10 MG TABS tablet Take 1 tablet (10 mg total) by mouth daily. 90 tablet 3   gabapentin  (NEURONTIN ) 300 MG capsule Take 2 capsules (600 mg total) by mouth 3 (three) times daily as needed (Takes 2-3 as needed). (Patient not taking: Reported on 08/23/2023) 90 capsule 0   methocarbamol  (ROBAXIN ) 500 MG tablet Take 1 tablet (500 mg total) by mouth every 8 (eight) hours as needed for muscle spasms. 90 tablet 1   metoprolol  succinate (TOPROL -XL) 25 MG 24 hr tablet Take 1 tablet (25 mg total) by mouth daily. Take with or immediately following a meal. 90 tablet 3   naloxone  (NARCAN ) nasal spray 4 mg/0.1 mL Place 4 mg into the nose as needed (overdose, as needed).     nicotine  (NICODERM CQ  - DOSED IN MG/24 HOURS) 14 mg/24hr patch Place 1 patch (14 mg total) onto the skin daily. (Patient not taking: Reported on 08/06/2023) 28 patch 0   pantoprazole  (PROTONIX ) 40 MG tablet Take 1 tablet (40 mg total) by mouth daily. (Patient not taking: Reported on 08/23/2023) 30 tablet 0   rivaroxaban  (XARELTO ) 20 MG TABS tablet Take 1 tablet (20 mg total) by mouth daily with supper. 90 tablet 3   sacubitril -valsartan  (ENTRESTO ) 24-26 MG Take 1 tablet by mouth 2 (two) times daily. 60 tablet 11   senna-docusate (SENOKOT-S) 8.6-50 MG tablet Take 1 tablet by mouth 2 (two) times daily. 60 tablet 1   spironolactone  (ALDACTONE ) 25 MG tablet Take 1 tablet (25 mg total) by mouth daily. 90 tablet 3   torsemide  (DEMADEX ) 20 MG tablet Take 3 tablets (60 mg total) by mouth 2 (two) times daily. 180 tablet 3   Past Surgical History Past Surgical History:  Procedure Laterality Date   ANKLE SURGERY     CARDIOVERSION N/A 08/10/2023   Procedure: CARDIOVERSION;  Surgeon: Darlis Eisenmenger, MD;  Location: [facility] INVASIVE CV LAB;  Service: Cardiovascular;  Laterality: N/A;   FACIAL COSMETIC SURGERY     RIGHT/LEFT HEART CATH AND CORONARY ANGIOGRAPHY N/A 08/09/2023    Procedure: RIGHT/LEFT HEART CATH AND CORONARY ANGIOGRAPHY;  Surgeon: Darlis Eisenmenger, MD;  Location: [facility] INVASIVE CV LAB;  Service: Cardiovascular;  Laterality: N/A;   TRANSESOPHAGEAL ECHOCARDIOGRAM (CATH LAB) N/A 08/10/2023   Procedure: TRANSESOPHAGEAL ECHOCARDIOGRAM;  Surgeon: Darlis Eisenmenger, MD;  Location: [facility] INVASIVE CV LAB;  Service: Cardiovascular;  Laterality: N/A;   Social:  Lives with her grandson Occupation: Does not work Support: From her grandson Level of Function: Independent in ADLs and IADLs PCP: Corrington, Kip A, MD Substances: Smokes 1  pack of cigarettes daily, methamphetamine 1 week ago, no cocaine for over 1 month, no alcohol, Xanax  yesterday and today obtained from the street  Family History:  Unable to obtain  Allergies: Allergies as of 01/17/2024 - Review Complete 01/17/2024  Allergen Reaction Noted   Pamelor [nortriptyline hcl] Anaphylaxis 11/07/2018   Vibra-tab [doxycycline] Other (See Comments)     Review of Systems: A complete ROS was negative except as per HPI.   OBJECTIVE:   Physical Exam: Blood pressure 115/73, pulse 95, temperature 97.7 F (36.5 C), temperature source Temporal, resp. rate 19, height 5\' 5"  (1.651 m), SpO2 98%.  Constitutional: Somnolent, excitable but redirectable. In no acute distress. HENT: Normocephalic, atraumatic,  Eyes: Sclera non-icteric, PERRL, EOM intact. Pupils pinpoint. Neck:normal atraumatic, no neck masses, normal thyroid, no jvd Cardio:Regular rate and rhythm. No murmurs, rubs, or gallops. 2+ bilateral radial and dorsalis pedis  pulses. Pulm:Distant heart sounds. Occasional expiratory wheeze but otherwise CTABL. Normal work of breathing on room air. Abdomen: Soft, non-tender, non-distended, positive bowel sounds. MSK:2+ LE edema bilaterally. Skin:Warm and dry. Neuro:Alert and oriented x3. No focal deficit noted. Psych:Intermittently somnolent vs histrionic.  Labs: CBC    Component Value Date/Time   WBC 7.6  01/17/2024 1034   RBC 4.01 01/17/2024 1034   HGB 12.0 01/17/2024 1034   HCT 36.5 01/17/2024 1034   PLT 316 01/17/2024 1034   MCV 91.0 01/17/2024 1034   MCH 29.9 01/17/2024 1034   MCHC 32.9 01/17/2024 1034   RDW 14.6 01/17/2024 1034   LYMPHSABS 1.4 05/31/2023 0712   MONOABS 0.7 05/31/2023 0712   EOSABS 0.2 05/31/2023 0712   BASOSABS 0.0 05/31/2023 0712     CMP     Component Value Date/Time   NA 137 01/17/2024 1034   K 3.8 01/17/2024 1034   CL 97 (L) 01/17/2024 1034   CO2 31 01/17/2024 1034   GLUCOSE 109 (H) 01/17/2024 1034   BUN 8 01/17/2024 1034   CREATININE 0.72 01/17/2024 1034   CALCIUM  8.5 (L) 01/17/2024 1034   PROT 6.0 (L) 01/17/2024 1034   ALBUMIN 2.7 (L) 01/17/2024 1034   AST 34 01/17/2024 1034   ALT 68 (H) 01/17/2024 1034   ALKPHOS 60 01/17/2024 1034   BILITOT 1.0 01/17/2024 1034   GFRNONAA >60 01/17/2024 1034   GFRAA >60 06/26/2017 1833    Imaging: DG Chest 1 View Result Date: 01/17/2024 CLINICAL DATA:  Fall. EXAM: CHEST  1 VIEW COMPARISON:  08/09/2023. FINDINGS: Cardiomegaly. Aortic atherosclerosis. No focal consolidation, sizeable pleural effusion, or pneumothorax. No acute osseous abnormality identified. IMPRESSION: 1. No acute findings in the chest. 2. Cardiomegaly. Electronically Signed   By: Mannie Seek M.D.   On: 01/17/2024 12:49   DG HIP UNILAT W OR W/O PELVIS 2-3 VIEWS LEFT Result Date: 01/17/2024 CLINICAL DATA:  Pain after fall. EXAM: DG HIP (WITH OR WITHOUT PELVIS) 2-3V LEFT COMPARISON:  08/06/2023. FINDINGS: There is no evidence of acute fracture or dislocation. Femoral heads are seated within the acetabulum. Mild degenerative changes of the bilateral hips. Sacroiliac joints and pubic symphysis appear anatomically aligned. Degenerative changes of the visualized lower lumbar spine. IMPRESSION: No acute osseous abnormality. Electronically Signed   By: Mannie Seek M.D.   On: 01/17/2024 12:47     EKG: personally reviewed my interpretation is afib  with a RVR.   ASSESSMENT & PLAN:   Assessment & Plan by Problem: Principal Problem:   Atrial fibrillation with RVR (HCC)   Truth Wolaver Kliewer is a 68 y.o. woman with  PMH of HFrEF, A-fib on Xarelto , attention deficit disorder, COPD, chronic hypoxic respiratory failure on 2 L [place], anxiety and depression, fibromyalgia, HLD, and CAD.   She presented to [facility] today with complaint of acute on chronic left hip pain and was subsequently found to be in A-fib with RVR and volume overloaded.  Atrial Fibrillation with RVR Present at admission, rates as high as the 130s but hemodynamically stable.  Was started on diltiazem  bolus and infusion.  Rates are somewhat improved around 100 but she remains in A-fib, has not converted.  There is hypermobility that she has missed some more many of her outpatient medicine doses. - Continue diltiazem  infusion - Resume home anticoagulation with Xarelto  20 daily - Not a candidate for nonemergent cardioversion given questionable anticoagulation adherence  Acute encephalopathy-hypersomnolence Concern for sedative overdose Substance use disorder She reports taking Xanax  from the street, but only ascribes to what I presume is one quarter of a pill.  She denies recent consumptions of other substances, including cocaine, narcotics, meth.  She is very somnolent on exam but will awaken with sternal rub for a few moments.  She is AO x 3.  Her pupils are pinpoint but her respiratory status is stable.  Suspect oversedation but not suggestive of impending hemodynamic collapse. - Await UDS from EDP - Collect VBG - Avoid sedating medications including narcotic pain medicine  - Supportive care  Shortness of breath without hypoxia Volume overload Acute HFrEF with EF 25-30% Hx COPD, Chronic hypoxic respiratory failure on 2L O2 She has an intermittent history of shortness of breath but is on room air and is not hypoxic.  Lung sounds are distant with an occasional wheeze but  generally clear.  Chest imaging does not show any acute process.  She does have elevated BNP and lower extremity edema.  On chart review it appears she takes 2 L of oxygen  out of the hospital, but I am not sure if she uses that lately.  I suspect that she has slowly progressive volume overload that has not yet fully decompensated.  I do not suspect a COPD exacerbation, but she did receive DuoNebs in the ED so would maintain suspicion is that wears off.  She does not appear to have a URI.  Will treat for a subtle heart failure exacerbation with diuresis. - Lasix  40 IV, reassess in am and could restart home torsemide  60 BID - Strict ins and outs, daily weights.  Dry weight is unclear - DuoNebs every 6 as needed  L hip pain, sciatic pain History consistent with sciatic pain.  She does not have emergent symptoms, namely bowel/bladder dysfunction.  She is very worried about this but was easily distractible. - Avoid narcotic pain medicine - Tylenol  as needed - Can resume home gabapentin  dose of 600 TID if needed  Medication noncompliance She understands that she has many medicines that she is post to take but admits to missing doses often.  She does not give a clear reason why.  She did not take her medicines today but did yesterday.  She cannot confirm her medication list.  It is possible she has missed many medicines and that this is contributing to her presentation. - Will evaluate tomorrow for resumption of home medicines  Cigarette smoker 1 ppd. Will give a nicotine  patch. She should quit.  Diet: Heart Healthy VTE: DOAC IVF: None,None Code: Full  Dispo: Admit patient to Observation with expected length of stay less than 2 midnights.  Signed: Carleen Chary,  DO Internal Medicine Resident PGY-1  01/17/2024, 2:59 PM

## 2024-01-17 NOTE — ED Notes (Addendum)
 Pt called out for help, went to check on pt, and she requested ice chips. Ice chips provided. Call light handed with in reach

## 2024-01-17 NOTE — ED Notes (Signed)
 Pt heard crying from EMS triage, checked on pt, she is sitting on the bed eating dinner sobbing, when asked what was wrong, it was difficult to understand her due to her tearfulness, paramedic was updated on the situation.

## 2024-01-17 NOTE — ED Notes (Addendum)
 Pt sobbing after being informed by son over the phone that he left a note for the pt during ED lock down. EMT-P was unable to locate such note. No such note was mentioned during pt report.

## 2024-01-17 NOTE — ED Triage Notes (Addendum)
 Pt here from home for left hip pain and bilateral lower extremity edema +3. C/O afib RVR. Pt fell a month ago. Denies hitting head, on xarelto . Hx of CHF. Axox4, Pt on 4L of O2 all the time.

## 2024-01-17 NOTE — ED Notes (Signed)
 Pt was lethargic and difficult to rouse until pt was heard screaming and crying for help. Upon entering the pt room she stated that she needed assistance to the bedside toilet. Pt also c/o 10/10 back pain. Provider was paged for medication for pt.

## 2024-01-17 NOTE — ED Notes (Signed)
 iStat venous blood gas per Malen Scudder, DO.

## 2024-01-17 NOTE — Hospital Course (Addendum)
 Autumn [name] is a 68 y.o. woman with a pertinent PMH of HFrEF, A-fib on Xarelto , attention deficit disorder, COPD, chronic hypoxic respiratory failure on 2 L [place], anxiety and depression, fibromyalgia, HLD, and CAD.    She presented with acute on chronic left hip pain and admitted for A-fib with RVR and acute HFrEF with hospital course complicated by cardiac rhythm instability (self-terminating VT) requiring two nights ICU care and is S/P PCI to Lcx and LAD.   Arrhythmias Atrial Fibrillation with RVR s/p Cardioversion 6/11 now NSR Polymorphic VT requiring ICU care now resolved Long Qt Admission rates as high as the 130s but hemodynamically stable.  Refractory to diltaizem and amiodarone  infusions and so required DCCV with prior TEE without LA thrombus (was not taking her prescribed anticoagulaiton PTH). Electrical cardioversion successful on 6/11. Briefly degraded to Afib again but chemical cardioversion via amiodarone  infusion was achieved.  - Discharge in NSR with recommendation of Amiodarone  200mg  daily and anticoagulation of ASA/Plavix /Eliquis  x 1 month, then Plavix /Eliquis  x 1 year (influence from CAD/stending per below). She is also on Toprol  25 daily.  Approx 1 week into hospitalization, she demonstrated arrhythmia of a polymorphic VT/ Torsades several seconds long but self terminating. This happened twice and she had diminished consciousness, but returned to normal after arrhythmia subsided. She was observed for two nights in the cardiac ICU due to this. Amiodarone  dose was reduced given a long Qtc that may promote such arrhtyhmias. - Recommend caution with medicines that prolong Qtc - She is discharged with a life vest and electrophysiology follow up. Comorbid conditions prevent candidacy for implanted devices.   CAD S/P PCI stenting of LCX and LAD HFrEF with EF 30-35% Low blood pressure limiting GDMT Mitral Regurgitation Hx COPD, Chronic hypoxic respiratory failure on 2L O2 Dyspnea and  volume overload on admission but no hypoxia. Treated with IV diuresis at one point requiring a lasix  drip. It is likely that her uncontrolled Afib/tachyarrhythmia will continue to contribute to her cardiomyopathy. Catheterization after arrhythmias demonstrated high grade disease in the LCX and LAD with PCI performed. Discharge GDMT somewhat limited due to blood pressure. - GDMT is Toprol  25mg  daily, Spiro 25 daily, Farxiga  10 every day. Entresto  is halted.  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 States her pain contributes to ambulatory dysfunction. Consistent with radiculopathy stemming from a fall 2 months ago with progression. No recent trauma or neurologic dysfunction. MRI lumbar spine with some evidence of Diskitis, but clinically does not have signs of this. Afebrile, no leukocytosis, clean blood cultures, normal inflammatory markers, not requiring Abx. Discussed this finding with ID on call. Dr. Dennise offered to see Autumn [name] in her office in late July. I have ordered an MRI to be done a [facility] hospital in mid July in preparation for that appointment. - Tx tylenol , lyrica , Cymbalta , robaxin , lidocaine  patch, muscle rub cream and discharged with supplies of all these. Gabapentin  stopped as it caused tremors. - Consider injection therapy after diskitis workup - She will receive HHPT at discharge  Acute encephalopathy-hypersomnolence Concern for sedative overdose Substance use disorder MDD/GAD Presented with hypersomnolence - awakable to sternal rub and oriented appropriately but quickly fell\ asleep. She admits to obtaining Xanax  from street to self treat mood disorders. Somnolence resolved with time and supportive care. - May benefit from further psychiatry and substance abuse resources out of hospital - Started cymbalta  and per above

## 2024-01-18 ENCOUNTER — Other Ambulatory Visit (HOSPITAL_COMMUNITY)

## 2024-01-18 DIAGNOSIS — F1510 Other stimulant abuse, uncomplicated: Secondary | ICD-10-CM | POA: Diagnosis present

## 2024-01-18 DIAGNOSIS — I110 Hypertensive heart disease with heart failure: Secondary | ICD-10-CM | POA: Diagnosis not present

## 2024-01-18 DIAGNOSIS — I361 Nonrheumatic tricuspid (valve) insufficiency: Secondary | ICD-10-CM | POA: Diagnosis not present

## 2024-01-18 DIAGNOSIS — E785 Hyperlipidemia, unspecified: Secondary | ICD-10-CM | POA: Diagnosis present

## 2024-01-18 DIAGNOSIS — I429 Cardiomyopathy, unspecified: Secondary | ICD-10-CM | POA: Diagnosis present

## 2024-01-18 DIAGNOSIS — M25552 Pain in left hip: Secondary | ICD-10-CM

## 2024-01-18 DIAGNOSIS — I4729 Other ventricular tachycardia: Secondary | ICD-10-CM | POA: Diagnosis not present

## 2024-01-18 DIAGNOSIS — I5021 Acute systolic (congestive) heart failure: Secondary | ICD-10-CM | POA: Diagnosis not present

## 2024-01-18 DIAGNOSIS — I5023 Acute on chronic systolic (congestive) heart failure: Secondary | ICD-10-CM | POA: Diagnosis present

## 2024-01-18 DIAGNOSIS — E876 Hypokalemia: Secondary | ICD-10-CM | POA: Diagnosis present

## 2024-01-18 DIAGNOSIS — I4901 Ventricular fibrillation: Secondary | ICD-10-CM | POA: Diagnosis not present

## 2024-01-18 DIAGNOSIS — I4821 Permanent atrial fibrillation: Secondary | ICD-10-CM | POA: Diagnosis present

## 2024-01-18 DIAGNOSIS — J9611 Chronic respiratory failure with hypoxia: Secondary | ICD-10-CM | POA: Diagnosis present

## 2024-01-18 DIAGNOSIS — I4819 Other persistent atrial fibrillation: Secondary | ICD-10-CM | POA: Diagnosis not present

## 2024-01-18 DIAGNOSIS — I959 Hypotension, unspecified: Secondary | ICD-10-CM | POA: Diagnosis not present

## 2024-01-18 DIAGNOSIS — I739 Peripheral vascular disease, unspecified: Secondary | ICD-10-CM | POA: Diagnosis present

## 2024-01-18 DIAGNOSIS — I340 Nonrheumatic mitral (valve) insufficiency: Secondary | ICD-10-CM | POA: Diagnosis present

## 2024-01-18 DIAGNOSIS — I472 Ventricular tachycardia, unspecified: Secondary | ICD-10-CM | POA: Diagnosis not present

## 2024-01-18 DIAGNOSIS — I2510 Atherosclerotic heart disease of native coronary artery without angina pectoris: Secondary | ICD-10-CM | POA: Diagnosis not present

## 2024-01-18 DIAGNOSIS — F1410 Cocaine abuse, uncomplicated: Secondary | ICD-10-CM | POA: Diagnosis present

## 2024-01-18 DIAGNOSIS — Q2112 Patent foramen ovale: Secondary | ICD-10-CM | POA: Diagnosis not present

## 2024-01-18 DIAGNOSIS — R443 Hallucinations, unspecified: Secondary | ICD-10-CM | POA: Diagnosis not present

## 2024-01-18 DIAGNOSIS — I4891 Unspecified atrial fibrillation: Secondary | ICD-10-CM | POA: Diagnosis not present

## 2024-01-18 DIAGNOSIS — F1910 Other psychoactive substance abuse, uncomplicated: Secondary | ICD-10-CM | POA: Diagnosis not present

## 2024-01-18 DIAGNOSIS — F411 Generalized anxiety disorder: Secondary | ICD-10-CM | POA: Diagnosis not present

## 2024-01-18 DIAGNOSIS — T424X1A Poisoning by benzodiazepines, accidental (unintentional), initial encounter: Secondary | ICD-10-CM | POA: Diagnosis present

## 2024-01-18 DIAGNOSIS — G928 Other toxic encephalopathy: Secondary | ICD-10-CM | POA: Diagnosis present

## 2024-01-18 DIAGNOSIS — F1310 Sedative, hypnotic or anxiolytic abuse, uncomplicated: Secondary | ICD-10-CM | POA: Diagnosis present

## 2024-01-18 DIAGNOSIS — E669 Obesity, unspecified: Secondary | ICD-10-CM | POA: Diagnosis present

## 2024-01-18 DIAGNOSIS — N179 Acute kidney failure, unspecified: Secondary | ICD-10-CM | POA: Diagnosis not present

## 2024-01-18 DIAGNOSIS — F17210 Nicotine dependence, cigarettes, uncomplicated: Secondary | ICD-10-CM | POA: Diagnosis present

## 2024-01-18 DIAGNOSIS — G8929 Other chronic pain: Secondary | ICD-10-CM | POA: Diagnosis present

## 2024-01-18 DIAGNOSIS — M545 Low back pain, unspecified: Secondary | ICD-10-CM | POA: Diagnosis present

## 2024-01-18 DIAGNOSIS — Z9981 Dependence on supplemental oxygen: Secondary | ICD-10-CM | POA: Diagnosis not present

## 2024-01-18 DIAGNOSIS — F329 Major depressive disorder, single episode, unspecified: Secondary | ICD-10-CM | POA: Diagnosis present

## 2024-01-18 DIAGNOSIS — J449 Chronic obstructive pulmonary disease, unspecified: Secondary | ICD-10-CM | POA: Diagnosis present

## 2024-01-18 DIAGNOSIS — I4721 Torsades de pointes: Secondary | ICD-10-CM | POA: Diagnosis not present

## 2024-01-18 LAB — BASIC METABOLIC PANEL WITH GFR
Anion gap: 12 (ref 5–15)
BUN: 8 mg/dL (ref 8–23)
CO2: 28 mmol/L (ref 22–32)
Calcium: 9.1 mg/dL (ref 8.9–10.3)
Chloride: 96 mmol/L — ABNORMAL LOW (ref 98–111)
Creatinine, Ser: 0.86 mg/dL (ref 0.44–1.00)
GFR, Estimated: >60 mL/min (ref >60)
Glucose, Bld: 167 mg/dL — ABNORMAL HIGH (ref 70–99)
Potassium: 3.2 mmol/L — ABNORMAL LOW (ref 3.5–5.1)
Sodium: 136 mmol/L (ref 135–145)

## 2024-01-18 LAB — TROPONIN I (HIGH SENSITIVITY)
Troponin I (High Sensitivity): 20 ng/L — ABNORMAL HIGH (ref <18)
Troponin I (High Sensitivity): 32 ng/L — ABNORMAL HIGH (ref <18)

## 2024-01-18 LAB — CBC
HCT: 39.7 % (ref 36.0–46.0)
Hemoglobin: 12.9 g/dL (ref 12.0–15.0)
MCH: 29.1 pg (ref 26.0–34.0)
MCHC: 32.5 g/dL (ref 30.0–36.0)
MCV: 89.4 fL (ref 80.0–100.0)
Platelets: 357 10*3/uL (ref 150–400)
RBC: 4.44 MIL/uL (ref 3.87–5.11)
RDW: 14.5 % (ref 11.5–15.5)
WBC: 7.8 10*3/uL (ref 4.0–10.5)
nRBC: 0 % (ref 0.0–0.2)

## 2024-01-18 LAB — BLOOD GAS, VENOUS
Acid-Base Excess: 5.6 mmol/L — ABNORMAL HIGH (ref 0.0–2.0)
Bicarbonate: 30.6 mmol/L — ABNORMAL HIGH (ref 20.0–28.0)
O2 Saturation: 83.1 %
Patient temperature: 36.6
pCO2, Ven: 44 mmHg (ref 44–60)
pH, Ven: 7.45 — ABNORMAL HIGH (ref 7.25–7.43)
pO2, Ven: 49 mmHg — ABNORMAL HIGH (ref 32–45)

## 2024-01-18 MED ORDER — GABAPENTIN 300 MG PO CAPS
600.0000 mg | ORAL_CAPSULE | Freq: Three times a day (TID) | ORAL | Status: DC | PRN
  Administered 2024-01-18 – 2024-01-20 (×3): 600 mg via ORAL
  Filled 2024-01-18 (×3): qty 2

## 2024-01-18 MED ORDER — DIGOXIN 125 MCG PO TABS
0.1250 mg | ORAL_TABLET | Freq: Every day | ORAL | Status: DC
  Administered 2024-01-18 – 2024-01-22 (×5): 0.125 mg via ORAL
  Filled 2024-01-18 (×6): qty 1

## 2024-01-18 MED ORDER — GABAPENTIN 300 MG PO CAPS
600.0000 mg | ORAL_CAPSULE | Freq: Once | ORAL | Status: AC
  Administered 2024-01-18: 600 mg via ORAL
  Filled 2024-01-18: qty 2

## 2024-01-18 MED ORDER — GABAPENTIN 300 MG PO CAPS: 600.0000 mg | ORAL_CAPSULE | Freq: Three times a day (TID) | ORAL | Status: DC | PRN

## 2024-01-18 MED ORDER — BISACODYL 10 MG RE SUPP
10.0000 mg | Freq: Once | RECTAL | Status: AC
  Administered 2024-01-18: 10 mg via RECTAL
  Filled 2024-01-18: qty 1

## 2024-01-18 MED ORDER — METOPROLOL SUCCINATE ER 25 MG PO TB24
25.0000 mg | ORAL_TABLET | Freq: Every day | ORAL | Status: DC
  Administered 2024-01-18 – 2024-01-29 (×9): 25 mg via ORAL
  Filled 2024-01-18 (×11): qty 1

## 2024-01-18 MED ORDER — SODIUM CHLORIDE 0.9 % IV SOLN: INTRAVENOUS | Status: DC

## 2024-01-18 MED ORDER — SPIRONOLACTONE 25 MG PO TABS
25.0000 mg | ORAL_TABLET | Freq: Every day | ORAL | Status: DC
  Administered 2024-01-18 – 2024-01-22 (×5): 25 mg via ORAL
  Filled 2024-01-18 (×5): qty 1

## 2024-01-18 MED ORDER — LOSARTAN POTASSIUM 25 MG PO TABS: 12.5000 mg | ORAL_TABLET | Freq: Every day | ORAL | Status: DC

## 2024-01-18 MED ORDER — AMIODARONE HCL IN DEXTROSE 360-4.14 MG/200ML-% IV SOLN
60.0000 mg/h | INTRAVENOUS | Status: AC
  Administered 2024-01-18: 60 mg/h via INTRAVENOUS
  Filled 2024-01-18 (×2): qty 200

## 2024-01-18 MED ORDER — AMIODARONE IV BOLUS ONLY 150 MG/100ML
150.0000 mg | Freq: Once | INTRAVENOUS | Status: AC
  Administered 2024-01-18: 150 mg via INTRAVENOUS
  Filled 2024-01-18: qty 100

## 2024-01-18 MED ORDER — METOLAZONE 5 MG PO TABS
2.5000 mg | ORAL_TABLET | Freq: Once | ORAL | Status: AC
  Administered 2024-01-18: 2.5 mg via ORAL
  Filled 2024-01-18: qty 1

## 2024-01-18 MED ORDER — POTASSIUM CHLORIDE 20 MEQ PO PACK
40.0000 meq | PACK | Freq: Two times a day (BID) | ORAL | Status: AC
  Administered 2024-01-18 (×2): 40 meq via ORAL
  Filled 2024-01-18 (×2): qty 2

## 2024-01-18 MED ORDER — AMIODARONE HCL IN DEXTROSE 360-4.14 MG/200ML-% IV SOLN
30.0000 mg/h | INTRAVENOUS | Status: DC
  Administered 2024-01-18 – 2024-01-20 (×4): 30 mg/h via INTRAVENOUS
  Filled 2024-01-18 (×4): qty 200

## 2024-01-18 MED ORDER — SACUBITRIL-VALSARTAN 24-26 MG PO TABS
1.0000 | ORAL_TABLET | Freq: Two times a day (BID) | ORAL | Status: DC
  Administered 2024-01-18 – 2024-01-22 (×9): 1 via ORAL
  Filled 2024-01-18 (×9): qty 1

## 2024-01-18 MED ORDER — AMIODARONE LOAD VIA INFUSION
150.0000 mg | Freq: Once | INTRAVENOUS | Status: AC
  Administered 2024-01-18: 150 mg via INTRAVENOUS
  Filled 2024-01-18: qty 83.34

## 2024-01-18 MED ORDER — FUROSEMIDE 10 MG/ML IJ SOLN
80.0000 mg | Freq: Two times a day (BID) | INTRAMUSCULAR | Status: DC
  Administered 2024-01-18 – 2024-01-20 (×3): 80 mg via INTRAVENOUS
  Filled 2024-01-18 (×4): qty 8

## 2024-01-18 MED ORDER — ORAL CARE MOUTH RINSE: 15.0000 mL | OROMUCOSAL | Status: DC | PRN

## 2024-01-18 MED ORDER — ATORVASTATIN CALCIUM 80 MG PO TABS
80.0000 mg | ORAL_TABLET | Freq: Every day | ORAL | Status: DC
  Administered 2024-01-18 – 2024-01-29 (×12): 80 mg via ORAL
  Filled 2024-01-18 (×12): qty 1

## 2024-01-18 MED ORDER — HYDROXYZINE HCL 10 MG PO TABS
10.0000 mg | ORAL_TABLET | Freq: Three times a day (TID) | ORAL | Status: DC | PRN
  Administered 2024-01-18 – 2024-01-27 (×7): 10 mg via ORAL
  Filled 2024-01-18 (×10): qty 1

## 2024-01-18 NOTE — Consult Note (Addendum)
 Advanced Heart Failure Team Consult Note   Primary Physician: Corrington, Irma Mans, MD Cardiologist:  Kardie Tobb, DO  Reason for Consultation: A fib RVR and A/C HFrEF  HPI:    Autumn [name] is seen today for evaluation of a fib RVR and A/C HFrEF at the request of Dr. Diann Forth, hospital medicine.   Autumn [name] is a 68 y.o. female w/ h/o systolic heart failure, CAD s/p prior DES to LAD, persistent>>permanent atrial fibrillation, mitral regurgitation and COPD. Followed primarily in the past by [facility].    Echo 6/22 EF 60-65%, RV normal.   Admitted 4/24 for acute CHF. Echo EF down to 40-45%, diffuse HK, RV normal, calcified/degenerative mitral valve w/ mild MR. Of note, she was reported to be in afib w/ RVR during time of study, w/ v-rates in the 120s-130s. She was diuresed w/ IV Lasix  and metoprolol  was increased for rate control of afib.    Readmitted again 6/24 for symptomatic afib/ w/ RVR. Limited Echo EF further reduced down to 35-40%, mod-severe MR, RV moderately reduced, RVSP 43 mmHg. Also treated for a/c CHF. Diuresed and rate control regimen further titrated.    Readmitted again 7/24 for afib w/ RVR and a/c CHF. Repeat Limited Echo 10/24, EF 30-35%, global HK, severe LAE, mod-severe MR, small pericardial effusion localized near RA but no evidence of tamponade, mod TR. Digoxin  was added to regimen. Adequate rate control was noted to be achieved prior to d/c.    Readmitted again 10/24 for the same, a/c CHF and afib w/ RVR. Admitted by internal medicine. Cardiology not consulted. Diuresed w/ IV Lasix  and transitioned to oral torsemide . Digoxin  was discontinued on discharge summary, though not well outlined why. Renal fx was ok. Continued on high dose Toprol  XL 100 mg daily and referred to [facility] clinic at d/c. From [facility] clinic, she was [facility] for further HF management.   12/24 admission off medications, volume overloaded with AF/RVR.  She was diuresed and  cardioverted.  LHC showed 75% mLCx, 80% D2, 60% pRCA.  Medically managed.  She underwent TEE-guided DCCV.   Presented to the ED yesterday with hip pain, lower extremity swelling and shortness of breath.  Reports noncompliance with medications.  Lives with her grandson.  Was having difficulty walking short distances 2/2 hip pain and swelling.  Denied any recent substance abuse to me aside from buying Xanax  off the street, but I do see where she reported cocaine use over a month ago and meth 1 week ago per H&P.  Smokes half pack of cigarettes per day.  Denies EtOH.  She was found to be in A-fib RVR on admission.  Reports feeling palpitations but unsure when it started.  Patient labs reviewed: K 3.8, SCR 0.72, AST WNL, ALT 68, UDS positive for amphetamines, benzos, THC.  UA negative.  EKG showed A-fib RVR.  Resting comfortably in bed.  Reports recent depression which has made it harder for her to take her medications.  Reports buying Xanax  off the streets to help with the depression with no relief.  Denies chest pain.  SOB minimally improved.  Has O2 at home that she uses as needed.  Home Medications Prior to Admission medications   Medication Sig Start Date End Date Taking? Authorizing Provider  albuterol  (VENTOLIN  HFA) 108 (90 Base) MCG/ACT inhaler Inhale 2 puffs into the lungs every 4 (four) hours as needed for wheezing. 05/20/23   [provider]  amiodarone  (PACERONE ) 200 MG tablet  Take 1 tablet (200 mg total) by mouth 2 (two) times daily for 14 days, THEN 2 tablets (400 mg total) daily. 08/23/23 08/21/24  Lauralee Poll, MD  atorvastatin  (LIPITOR ) 80 MG tablet Take 1 tablet (80 mg total) by mouth daily. 08/23/23   Lauralee Poll, MD  Budeson-Glycopyrrol-Formoterol  (BREZTRI  AEROSPHERE) 160-9-4.8 MCG/ACT AERO Inhale 2 puffs into the lungs in the morning and at bedtime. 08/23/23   Lauralee Poll, MD  dapagliflozin  propanediol (FARXIGA ) 10 MG TABS tablet Take 1 tablet (10 mg total) by  mouth daily. 10/18/23   Lauralee Poll, MD  gabapentin  (NEURONTIN ) 300 MG capsule Take 2 capsules (600 mg total) by mouth 3 (three) times daily as needed (Takes 2-3 as needed). 06/09/23   Sheree Dieter, MD  methocarbamol  (ROBAXIN ) 500 MG tablet Take 1 tablet (500 mg total) by mouth every 8 (eight) hours as needed for muscle spasms. 08/12/23   Deforest Fast, MD  metoprolol  succinate (TOPROL -XL) 25 MG 24 hr tablet Take 1 tablet (25 mg total) by mouth daily. Take with or immediately following a meal. 08/23/23   Lauralee Poll, MD  naloxone  (NARCAN ) nasal spray 4 mg/0.1 mL Place 4 mg into the nose as needed (overdose, as needed). 10/06/19   [provider]  nicotine  (NICODERM CQ  - DOSED IN MG/24 HOURS) 14 mg/24hr patch Place 1 patch (14 mg total) onto the skin daily. 06/10/23   Sheree Dieter, MD  pantoprazole  (PROTONIX ) 40 MG tablet Take 1 tablet (40 mg total) by mouth daily. 06/10/23   Sheree Dieter, MD  rivaroxaban  (XARELTO ) 20 MG TABS tablet Take 1 tablet (20 mg total) by mouth daily with supper. 08/23/23   Lauralee Poll, MD  sacubitril -valsartan  (ENTRESTO ) 24-26 MG Take 1 tablet by mouth 2 (two) times daily. 08/23/23   Lauralee Poll, MD  senna-docusate (SENOKOT-S) 8.6-50 MG tablet Take 1 tablet by mouth 2 (two) times daily. 08/12/23   Deforest Fast, MD  spironolactone  (ALDACTONE ) 25 MG tablet Take 1 tablet (25 mg total) by mouth daily. 10/18/23   Lauralee Poll, MD  torsemide  (DEMADEX ) 20 MG tablet Take 3 tablets (60 mg total) by mouth 2 (two) times daily. 08/23/23   Lauralee Poll, MD    Past Medical History: Past Medical History:  Diagnosis Date   Anxiety    Arthritis    Attention deficit disorder (ADD)    COPD (chronic obstructive pulmonary disease) (HCC)    Depression    Drug abuse (HCC)    Fibromyalgia    HFrEF (heart failure with reduced ejection fraction) (HCC)    Hyperlipidemia    Paroxysmal Atrial fibrillation     Past Surgical  History: Past Surgical History:  Procedure Laterality Date   ANKLE SURGERY     CARDIOVERSION N/A 08/10/2023   Procedure: CARDIOVERSION;  Surgeon: Darlis Eisenmenger, MD;  Location: [facility] INVASIVE CV LAB;  Service: Cardiovascular;  Laterality: N/A;   FACIAL COSMETIC SURGERY     RIGHT/LEFT HEART CATH AND CORONARY ANGIOGRAPHY N/A 08/09/2023   Procedure: RIGHT/LEFT HEART CATH AND CORONARY ANGIOGRAPHY;  Surgeon: Darlis Eisenmenger, MD;  Location: [facility] INVASIVE CV LAB;  Service: Cardiovascular;  Laterality: N/A;   TRANSESOPHAGEAL ECHOCARDIOGRAM (CATH LAB) N/A 08/10/2023   Procedure: TRANSESOPHAGEAL ECHOCARDIOGRAM;  Surgeon: Darlis Eisenmenger, MD;  Location: [facility] INVASIVE CV LAB;  Service: Cardiovascular;  Laterality: N/A;    Family History: History reviewed. No pertinent family history.  Social History: Social History   Socioeconomic History   Marital status: Divorced  Spouse name: Not on file   Number of children: 3   Years of education: Not on file   Highest education level: High school graduate  Occupational History   Occupation: disabled  Tobacco Use   Smoking status: Every Day    Current packs/day: 0.50    Average packs/day: 0.5 packs/day for 20.0 years (10.0 ttl pk-yrs)    Types: Cigarettes   Smokeless tobacco: Never   Tobacco comments:    Half pack daily going to try patches  Vaping Use   Vaping status: Every Day   Substances: Nicotine , Flavoring  Substance and Sexual Activity   Alcohol use: Not Currently   Drug use: Yes    Types: Cocaine, Marijuana, Methamphetamines    Comment: MJ, Cocaine and Crystal Meth   Sexual activity: Not Currently  Other Topics Concern   Not on file  Social History Narrative   Not on file   Social Drivers of Health   Financial Resource Strain: Low Risk  (08/19/2023)   Received from [facility]   Overall Financial Resource Strain (CARDIA)    Difficulty of Paying Living Expenses: Not very hard  Food Insecurity: No Food Insecurity (01/18/2024)    Hunger Vital Sign    Worried About Running Out of Food in the Last Year: Never true    Ran Out of Food in the Last Year: Never true  Transportation Needs: No Transportation Needs (01/18/2024)   PRAPARE - Administrator, Civil Service (Medical): No    Lack of Transportation (Non-Medical): No  Physical Activity: Unknown (08/19/2023)   Received [facility]   Exercise Vital Sign    Days of Exercise per Week: 0 days    Minutes of Exercise per Session: Not on file  Stress: No Stress Concern Present (08/19/2023)   Received from [facility] of Occupational Health - Occupational Stress Questionnaire    Feeling of Stress : Not at all  Social Connections: Unknown (01/18/2024)   Social Connection and Isolation Panel [NHANES]    Frequency of Communication with Friends and Family: More than three times a week    Frequency of Social Gatherings with Friends and Family: Three times a week    Attends Religious Services: Patient declined    Active Member of Clubs or Organizations: Patient declined    Attends Banker Meetings: Patient declined    Marital Status: Patient declined    Allergies:  Allergies  Allergen Reactions   Pamelor [Nortriptyline Hcl] Anaphylaxis   Vibra-Tab [Doxycycline] Other (See Comments)    Unknown reaction    Objective:    Vital Signs:   Temp:  [97.7 F (36.5 C)-98.1 F (36.7 C)] 97.8 F (36.6 C) (06/10 1138) Pulse Rate:  [44-119] 110 (06/10 1138) Resp:  [18-25] 20 (06/10 1138) BP: (107-158)/(60-122) 127/87 (06/10 1138) SpO2:  [94 %-100 %] 96 % (06/10 1138) Weight:  [89.1 kg-93.3 kg] 93.3 kg (06/10 0511) Last BM Date : 01/17/24  Weight change: Filed Weights   01/17/24 2324 01/18/24 0511  Weight: 89.1 kg 93.3 kg    Intake/Output:   Intake/Output Summary (Last 24 hours) at 01/18/2024 1141 Last data filed at 01/18/2024 0514 Gross per 24 hour  Intake --  Output 200 ml  Net -200 ml    Physical Exam     General: Disheveled appearing.   Neck: supple. JVD to ear.  Cor: PMI nondisplaced. Regular rate & rhythm. No rubs, gallops. 2/6 MR. Lungs: clear, diminished bases Extremities: no cyanosis,  clubbing, rash, nonpitting -+1 BLE edema  Neuro: alert & oriented x 3. Moves all 4 extremities w/o difficulty. Affect pleasant.   Telemetry   A-fib low 100s (Personally reviewed)    EKG    A fib RVR 133 bpm 01/17/24  Labs   Basic Metabolic Panel: Recent Labs  Lab 01/17/24 1034 01/17/24 1856 01/18/24 0421  NA 137 139 136  K 3.8 3.2* 3.2*  CL 97*  --  96*  CO2 31  --  28  GLUCOSE 109*  --  167*  BUN 8  --  8  CREATININE 0.72  --  0.86  CALCIUM  8.5*  --  9.1    Liver Function Tests: Recent Labs  Lab 01/17/24 1034  AST 34  ALT 68*  ALKPHOS 60  BILITOT 1.0  PROT 6.0*  ALBUMIN 2.7*   No results for input(s): "LIPASE", "AMYLASE" in the last 168 hours. No results for input(s): "AMMONIA" in the last 168 hours.  CBC: Recent Labs  Lab 01/17/24 1034 01/17/24 1856 01/18/24 0421  WBC 7.6  --  7.8  HGB 12.0 12.2 12.9  HCT 36.5 36.0 39.7  MCV 91.0  --  89.4  PLT 316  --  357    Cardiac Enzymes: No results for input(s): "CKTOTAL", "CKMB", "CKMBINDEX", "TROPONINI" in the last 168 hours.  BNP: BNP (last 3 results) Recent Labs    07/02/23 1153 08/06/23 1802 01/17/24 1034  BNP 411.5* 238.2* 826.3*    ProBNP (last 3 results) No results for input(s): "PROBNP" in the last 8760 hours.   CBG: No results for input(s): "GLUCAP" in the last 168 hours.  Coagulation Studies: No results for input(s): "LABPROT", "INR" in the last 72 hours.   Imaging   DG Chest 1 View Result Date: 01/17/2024 CLINICAL DATA:  Fall. EXAM: CHEST  1 VIEW COMPARISON:  08/09/2023. FINDINGS: Cardiomegaly. Aortic atherosclerosis. No focal consolidation, sizeable pleural effusion, or pneumothorax. No acute osseous abnormality identified. IMPRESSION: 1. No acute findings in the chest. 2. Cardiomegaly.  Electronically Signed   By: Mannie Seek M.D.   On: 01/17/2024 12:49   DG HIP UNILAT W OR W/O PELVIS 2-3 VIEWS LEFT Result Date: 01/17/2024 CLINICAL DATA:  Pain after fall. EXAM: DG HIP (WITH OR WITHOUT PELVIS) 2-3V LEFT COMPARISON:  08/06/2023. FINDINGS: There is no evidence of acute fracture or dislocation. Femoral heads are seated within the acetabulum. Mild degenerative changes of the bilateral hips. Sacroiliac joints and pubic symphysis appear anatomically aligned. Degenerative changes of the visualized lower lumbar spine. IMPRESSION: No acute osseous abnormality. Electronically Signed   By: Mannie Seek M.D.   On: 01/17/2024 12:47    Medications:   Current Medications:  acetaminophen   650 mg Oral QID   atorvastatin   80 mg Oral Daily   lidocaine   1 patch Transdermal Q24H   nicotine   21 mg Transdermal Daily   potassium chloride   40 mEq Oral BID   rivaroxaban   20 mg Oral Q supper    Infusions:  amiodarone  60 mg/hr (01/18/24 0941)   Followed by   amiodarone       Patient Profile   68 y/o female w/ h/o systolic heart failure, CAD s/p prior DES to LAD, persistent atrial fibrillation, mitral regurgitation, COPD. Admitted with L sciatic pain, a fib RVR and A/C HFrEF.   Assessment/Plan  Atrial fibrillation with RVR  - complicating HF and MR - Continue amio gtt . Give bolus to try to get rate below 100. - Restart Toprol  XL at 25 mg  daily  - Continue Xarelto .  Has missed doses. - May need to restart digoxin , previously stopped in January. - Consider TEE/DCCV if rates do not improve with amiodarone .  Poor chance of maintaing NSR with persistent noncompliance.  2. 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 Cannot r/o ischemic component. Has known CAD s/p prior LAD stenting (previously followed at [facility]) but denies any recent chest pain  - Echo 6/22 EF 60-65%, RV normal. - Echo  4/24 EF 40-45%, diffuse HK, RV normal  (in Afib) - Echo 6/24 EF 35-40%, mod-severe MR, RV moderately reduced (in Afib) - Echo 10/24, EF 30-35%, global HK, severe LAE, mod-severe MR (in Afib) - RHC 12/30 with elevated right and left heart filling pressures, low PAPi and low CI at 1.7  - TEE 12/31 with EF 25-30%, moderate RV dysfunction, moderate MR.  - NYHA Class IV, confounded by COPD, obesity and deconditioning  - Volume overloaded on exam, in setting of persistent AF and noncompliance - Start Lasix  80 IV twice daily.  Follow response  - Restart Spiro 25 mg daily  - Continue Farxiga  10 mg daily  - Restart home Entresto  24-26 mg BID - Restart Toprol  XL 25 mg daily  - Will TEE/DCCV   3. Mitral Regurgitation  - mod-severe on recent Echos - suspect functional MR c/b persistent afib  - HF and Afib optimization per above    4. CAD  - s/p LAD PCI + DES at [facility] 04/2019  - she denies any recent CP  - on ASA, statin + ? blocker   5. Chronic Hypoxic Respiratory Failure/COPD  - on Home O2 2L/min baseline. O2 sats 94%  - refer to [facility]  - advised to quit smoking    6. Untreated OSA - reports prior diagnosis but not on CPAP  - needs to be treated. Plan outpatient referral to pulmonology    7. Hypokalemia - K 3.2 - Restart Spiro  - follow BMP closely     8. Debilitating Sciatic Nerve Pain  - Per primary team  9. Substance abuse / Tobacco abuse Meth use Cocaine - Use both above in the last month. - UDS positive for amphetamines, benzos and THC - Consult HF SW for resources. - Imperative to quit  Medication concerns reviewed with patient and pharmacy team. Barriers identified: Substance use, medication noncompliance  Length of Stay: 0  Sheryl Donna, NP  01/18/2024, 11:41 AM  Advanced Heart Failure Team Pager [phone] (M-F; 7a - 5p)  Please contact [facility] for night-coverage after hours (4p -7a ) and weekends on [email or web address]   Patient seen with NP, I formulated  the plan and agree with the above note.    History of substance abuse and noncompliance as outlined above.  Suspect mixed ischemic/nonischemic cardiomyopathy.  Admitted with AF/RVR and CHF, has not been taking her medications.  Developed progressively worsening dyspnea, no chest pain.  Currently, SBP in 130s with HR 110s. Amiodarone  gtt started.     She is currently comfortable in bed.  Main complaint now is low back pain/sciatica. UDS +amphetamines.   General: NAD Neck: JVP 16 cm, no thyromegaly or thyroid nodule.  Lungs: Clear to auscultation bilaterally with normal respiratory effort. CV: Nondisplaced PMI.  Heart tachy, irregular S1/S2, no S3/S4, no murmur.  1+ edema to knees bilaterally.  No carotid bruit.  Normal pedal pulses.  Abdomen: Soft, nontender, no hepatosplenomegaly, no distention.  Skin: Intact without lesions or rashes.  Neurologic: Alert and oriented x 3.  Psych: Normal affect. Extremities: No clubbing or cyanosis.  HEENT: Normal.   Assessment/Plan: 1. Acute on chronic systolic CHF: Echo in 6/22 with EF 60-65%.  Had multiple echoes in 2024, each with lower EF.  Echo in 10/24 with EF 30-35%, moderate-severe MR.  She has a history of CAD but denies recent chest pain.  Cath 12/24 showed significant CAD but no critical lesion.  TEE 12/24 with EF 25-30%, moderate RV dysfunction, moderate MR. 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  She required milrinone  with diuresis in 12/24.  She is readmitted in AF/RVR (last ECG in 1/25 also showed AF) off all her medications with significant volume overload on exam.  Creatinine stable at 0.86.  - Lasix  80 mg IV bid and will give a dose of metolazone 2.5 x 1 today.  - Restart spironolactone  25 daily - Restart digoxin  0.125 daily - Can restart lower dose of Toprol  XL (25 mg daily).  - Restart Entresto  24/26 bid today.   - Eventually start Farxiga .  - I would like to get her back into  NSR.  - Echo - She is not a candidate for advanced therapies with active substance abuse and noncompliance.  2. Atrial fibrillation: Persistent, multiple admissions with AF/RVR.  Cardioverted to NSR in 12/24 but stopped amiodarone  and Xarelto  when she went home and went back into AF.  She was in AF in 1/25, no ECGs after that until this admission when she was in AF with RVR.  I worry that she has a component of tachy-mediated CMP.  I do not think that she tolerates AF well.  - Start amiodarone  gtt. - Restart low dose Toprol  XL 25 daily and digoxin  0.125 daily to help with rate control.   - Xarelto  restarted.  - After aggressive diuresis today, will aim for TEE-guided DCCV probably tomorrow.  Discussed risks/benefits with patient and she agrees to procedure.  - Would favor eventual AF ablation.  3. 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 Moderate on TEE 08/10/23. - Repeat echo this admission to follow.   4. CAD: DES to LAD in 9/20.  No recent chest pain. LHC 12/24 showed patent LAD stent, 75% mid LCx stenosis and heavily calcified 60% proximal RCA stenosis. Managed medically given no critical lesion and no chest pain.  - Continue atorvastatin .  - No ASA with Xarelto  use.  - Will esnd a troponin.  5. COPD: She smokes and uses home oxygen  at times.  - Needs to quit smoking.  6. Sciatica: Significant chronic pain.  Per [facility].  7. OSA: Suspected.  - Will need sleep study.  8. PFO: Small, noted on TEE.  9. Substance abuse: Cocaine in past though UDS did not show cocaine this admission.  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  Self-medicating for depression and back pain.  - Needs social work and psychiatry.   Long talk today about compliance with medications.  If she cannot take her meds regularly and stay out of atrial fibrillation, I think that her heart function will continue to decline.  ' Peder Bourdon 01/18/2024 2:00  PM

## 2024-01-18 NOTE — Plan of Care (Signed)
  Problem: Pain Managment: Goal: General experience of comfort will improve and/or be controlled Outcome: Progressing   Problem: Safety: Goal: Ability to remain free from injury will improve Outcome: Progressing   Problem: Skin Integrity: Goal: Risk for impaired skin integrity will decrease Outcome: Progressing   Problem: Cardiac: Goal: Ability to achieve and maintain adequate cardiopulmonary perfusion will improve Outcome: Progressing

## 2024-01-18 NOTE — Progress Notes (Signed)
 HD#0 SUBJECTIVE:  Patient Summary: Autumn [name] is a 68 y.o. with a pertinent PMH of HFrEF, A-fib on Xarelto , attention deficit disorder, COPD, chronic hypoxic respiratory failure on 2 L [place], anxiety and depression, fibromyalgia, HLD, and CAD , who presented with acute on chronic left hip pain and admitted for A-fib with RVR and HFrEF exacerbation.   Overnight Events and Interim History: NEO. Reports feeling well this morning. Denies acute concerns. Slept well. No pain. No dyspnea. No CP, palpitations.  OBJECTIVE:  Vital Signs: Vitals:   01/17/24 2324 01/18/24 0511 01/18/24 0752 01/18/24 1138  BP: 116/74 (!) 155/95 132/75 127/87  Pulse: 88 (!) 105  (!) 110  Resp: (!) 22 18 20 20   Temp: 98 F (36.7 C) 98 F (36.7 C) 97.7 F (36.5 C) 97.8 F (36.6 C)  TempSrc: Oral Oral Oral Oral  SpO2: 96% 96% 96% 96%  Weight: 89.1 kg 93.3 kg    Height: 5\' 4"  (1.626 m)      Supplemental O2: Nasal Cannula SpO2: 96 % O2 Flow Rate (L/min): 2 L/min  Filed Weights   01/17/24 2324 01/18/24 0511  Weight: 89.1 kg 93.3 kg     Intake/Output Summary (Last 24 hours) at 01/18/2024 1257 Last data filed at 01/18/2024 1610 Gross per 24 hour  Intake --  Output 200 ml  Net -200 ml   Net IO Since Admission: -200 mL [01/18/24 1257]  Physical Exam: Physical Exam Constitutional:      General: She is not in acute distress.    Appearance: She is obese. She is not ill-appearing.  Cardiovascular:     Rate and Rhythm: Regular rhythm. Tachycardia present.     Pulses: Normal pulses.  Pulmonary:     Effort: Pulmonary effort is normal.     Breath sounds: Wheezing present.  Abdominal:     General: Abdomen is flat.     Tenderness: There is no abdominal tenderness.  Musculoskeletal:     Right lower leg: Edema present.     Left lower leg: Edema present.     Comments: 2+ LE edema that is improved from yesterday.  Skin:    General: Skin is warm and dry.  Neurological:     General: No focal deficit  present.     Mental Status: She is alert and oriented to person, place, and time.     Comments: Appropriately oriented. Not as lethargic as yesterday. Stays alert during interview. Still appears tired.  Psychiatric:        Mood and Affect: Mood normal.        Behavior: Behavior normal.    Patient Lines/Drains/Airways Status     Active Line/Drains/Airways     Name Placement date Placement time Site Days   Peripheral IV 01/17/24 20 G Anterior;Left Forearm 01/17/24  1030  Forearm  1            ASSESSMENT/PLAN:  Assessment: Principal Problem:   Atrial fibrillation with RVR (HCC)  Autumn [name] is a 68 y.o. woman with PMH of HFrEF, A-fib on Xarelto , attention deficit disorder, COPD, chronic hypoxic respiratory failure on 2 L [place], anxiety and depression, fibromyalgia, HLD, and CAD.    She presented to [facility] ED today with complaint of acute on chronic left hip pain and was subsequently found to be in A-fib with RVR and volume overloaded.  Plan: Atrial Fibrillation with RVR Cardiology consulting. Remains in Afib. Rates above 100 but stable. Have started amiodarone  bolus/infusion. Hemodynamically stable. - Resume home  anticoagulation with Xarelto  20 daily - Will restart toprol  XL 25 daily - Dixogin under consideration, which was stopped in January - Chance she will require DCCV. Would need TEE first due to incomplete recent anticoagulation   Shortness of breath without hypoxia Volume overload Acute HFrEF with EF 25-30% Mitral Regurgitation Hx COPD, Chronic hypoxic respiratory failure on 2L O2 Cardiology consulting. Was placed on 2L [place] this am but this is her home level. Do not think it represents worsening as she remains volume up but improved and lungs are clear. Not complaining of dyspnea. Good UOP yesterday and no incresase in Cr. - Lasix  IV BID - Farxiga  10 daily - Losartan  12.5 daily - Strict ins and outs, daily weights.  Dry weight is unclear - DuoNebs every 6 as  needed  Acute encephalopathy-hypersomnolence Concern for sedative overdose Substance use disorder Improving. Alert through interview today, appears tired, AOx3. Suspect this was a drug effect. - VBG has ruled out hypercarbia - Avoid sedating medications including narcotic pain medicine  - Supportive care - Recommend abstinence from substances - TOC for resources    L hip pain, sciatic pain History consistent with sciatic pain.  She does not have emergent symptoms, namely bowel/bladder dysfunction.  Did not complain of it today. - Avoid narcotic pain medicine - Tylenol  as needed - Resume home gabapentin  dose of 600 TID   OSA COPD on 2L at baseline not in exacerbation Diagnosed but does not ear CPAP. - rec outpatient pulm eval  Medication noncompliance She understands that she has many medicines that she is post to take but admits to missing doses often.  She does not give a clear reason why.  She did not take her medicines today but did yesterday.  She cannot confirm her medication list.  It is possible she has missed many medicines and that this is contributing to her presentation.   Cigarette smoker 1 ppd. Will give a nicotine  patch. She should quit.  Best Practice: Diet: Cardiac diet IVF: none VTE: DOAC Code: Full DISPO: Anticipated discharge in 1-3 days to Home pending medical management.  Signature: Carleen Chary, D.O.  Internal Medicine Resident, PGY-1 [facility]  Pager: # [phone]. 12:57 PM, 01/18/2024

## 2024-01-18 NOTE — TOC Initial Note (Signed)
 Transition of Care [facility]) - Initial/Assessment Note    Patient Details  Name: Autumn [name] MRN: [id number] Date of Birth: 1956/05/21  [facility]) CM/SW Contact:    Ernst Heap Phone Number: [phone] 01/18/2024, 3:26 PM  Clinical Narrative:    HF CSW met with patient at bedside. Patient stated that she lives with her grandson and quite a few relatives in the home. Patient stated that she does not drive. Patient stated that she has no history of HH services. Patient stated that she uses oxygen  and a cane at home. Patient stated that she does not have a scale at home. Patient stated that she has a PCP. CSW explained that a hospital follow up will be scheduled closer towards dc. Patient stated that family will provide transportation at dc.   Throughout the discussion patient became teary eyed and stated that she would like to live alone. CSW asked if the patient has ever applied for income based housing. Patient sated that she got approved in [place], but turned it down. Patient stated that she will have her daughter in law assist her with the application process.   CSW addressed the SA abuse consult and provided SA and transportation resources. Patient stated that she used xanax  and meth "too recently."   TOC will continue following.        Expected Discharge Plan: Home/Self Care Barriers to Discharge: Continued Medical Work up   Patient Goals and CMS Choice            Expected Discharge Plan and Services       Living arrangements for the past 2 months: Single Family Home                                      Prior Living Arrangements/Services Living arrangements for the past 2 months: Single Family Home Lives with:: Relatives Patient language and need for interpreter reviewed:: Yes Do you feel safe going back to the place where you live?: Yes      Need for Family Participation in Patient Care: No (Comment) Care giver support system  in place?: No (comment)   Criminal Activity/Legal Involvement Pertinent to Current Situation/Hospitalization: No - Comment as needed  Activities of Daily Living   ADL Screening (condition at time of admission) Independently performs ADLs?: No Does the patient have a NEW difficulty with bathing/dressing/toileting/self-feeding that is expected to last >3 days?: No Does the patient have a NEW difficulty with getting in/out of bed, walking, or climbing stairs that is expected to last >3 days?: No Does the patient have a NEW difficulty with communication that is expected to last >3 days?: No Is the patient deaf or have difficulty hearing?: No Does the patient have difficulty seeing, even when wearing glasses/contacts?: Yes Does the patient have difficulty concentrating, remembering, or making decisions?: Yes  Permission Sought/Granted                  Emotional Assessment Appearance:: Appears stated age Attitude/Demeanor/Rapport: Engaged Affect (typically observed): Appropriate Orientation: : Oriented to Self, Oriented to Place, Oriented to  Time, Oriented to Situation Alcohol / Substance Use: Not Applicable Psych Involvement: No (comment)  Admission diagnosis:  Left hip pain [M25.552] COPD exacerbation (HCC) [J44.1] Atrial fibrillation with rapid ventricular response (HCC) [I48.91] Atrial fibrillation with RVR (HCC) [I48.91] Acute on chronic systolic CHF (congestive heart failure) (HCC) [I50.23] Patient Active Problem  List   Diagnosis Date Noted   Atrial fibrillation with RVR (HCC) 08/06/2023   Sciatica of left side 08/06/2023   Hypokalemia 08/06/2023   Acute exacerbation of CHF (congestive heart failure) (HCC) 05/31/2023   Acute on chronic congestive heart failure (HCC) 02/22/2023   Paroxysmal atrial fibrillation (HCC) 02/22/2023   Acute hypoxic respiratory failure (HCC) 02/17/2023   Acute on chronic systolic heart failure (HCC) 01/18/2023   COPD (chronic obstructive  pulmonary disease) (HCC) 12/03/2022   Parkinson's disease (HCC) 12/03/2022   PAD (peripheral artery disease) (HCC) 12/03/2022   CAD (coronary artery disease) S/P stent to LAD 12/03/2022   Atrial fibrillation with rapid ventricular response (HCC) 01/11/2021   Tobacco dependence 01/11/2021   Homelessness 01/11/2021   Alcohol dependence with uncomplicated withdrawal (HCC) 06/27/2017   Polysubstance abuse (HCC) 06/27/2017   Depression 06/27/2017   Essential hypertension 06/27/2017   GERD (gastroesophageal reflux disease) 06/27/2017   Attention deficit disorder (ADD)    PCP:  Corrington, Irma Mans, MD Pharmacy:   [facility] [address] [place] [address] Phone: [phone] Fax: [phone]  [facility][id number] [place], [place] - [address] AT [facility] [address] & [facility] [address] [place] [address] Phone: [phone] Fax: [phone]  [facility] #[id number] [place], [place] - [address] AT [facility] OF [facility] [address] & [facility] [address] [place] [address] Phone: [phone] Fax: [phone]  [facility] [address] [place] [address] Phone: [phone] Fax: [phone]     Social Drivers of Health (SDOH) Social History: SDOH Screenings   Food Insecurity: No Food Insecurity (01/18/2024)  Housing: Low Risk  (01/18/2024)  Transportation Needs: No Transportation Needs (01/18/2024)  Utilities: Not At Risk (01/18/2024)  Alcohol Screen: Low Risk  (06/02/2023)  Financial Resource Strain: Low Risk  (08/19/2023)   Received from [facility]  Physical Activity: Unknown (08/19/2023)   Received from [facility]  Social Connections: Unknown (01/18/2024)  Stress: No Stress Concern Present (08/19/2023)   Received from [facility]  Tobacco Use: High Risk (01/17/2024)   SDOH Interventions:     Readmission Risk Interventions    01/21/2023    1:29 PM 12/07/2022     1:27 PM  Readmission Risk Prevention Plan  Transportation Screening Complete Complete  PCP or Specialist Appt within 3-5 Days  Complete  HRI or Home Care Consult  Complete  Social Work Consult for Recovery Care Planning/Counseling  Complete  Palliative Care Screening  Not Applicable  Medication Review Ocean[facility]) Complete Complete  PCP or Specialist appointment within 3-5 days of discharge Complete   HRI or Home Care Consult Complete   SW Recovery Care/Counseling Consult Complete   Palliative Care Screening Not Applicable   Skilled Nursing Facility Not Applicable

## 2024-01-18 NOTE — Progress Notes (Signed)
 Heart Failure Navigator Progress Note  Assessed for Heart & Vascular TOC clinic readiness.  Patient does not meet criteria due to Advanced heart Failure Team patient of Dr. Alease Amend. .   Navigator will sign off at this time.   Randie Bustle, BSN, Scientist, clinical (histocompatibility and immunogenetics) Only

## 2024-01-18 NOTE — Care Management Obs Status (Signed)
 MEDICARE OBSERVATION STATUS NOTIFICATION   Patient Details  Name: Autumn [name] MRN: [id number] Date of Birth: [birth year]   Medicare Observation Status Notification Given:       Janith Melnick 01/18/2024, 8:25 AM

## 2024-01-18 NOTE — Progress Notes (Signed)
 Patient yelling out and sobbing that she is in pain.  Vitals stable.  No orders for PRN pain medication.  Dr Abner Hoffman made aware, new orders received.

## 2024-01-18 NOTE — Progress Notes (Signed)
Heart rate is too high for accurate echo at this time. 

## 2024-01-19 ENCOUNTER — Inpatient Hospital Stay (HOSPITAL_COMMUNITY)

## 2024-01-19 ENCOUNTER — Encounter (HOSPITAL_COMMUNITY)
Admission: EM | Disposition: A | Discharge status: Home Health | Payer: Self-pay | Source: Home / Self Care | Attending: Internal Medicine

## 2024-01-19 ENCOUNTER — Encounter (HOSPITAL_COMMUNITY): Payer: Self-pay | Admitting: Cardiology

## 2024-01-19 DIAGNOSIS — I5021 Acute systolic (congestive) heart failure: Secondary | ICD-10-CM

## 2024-01-19 DIAGNOSIS — F1910 Other psychoactive substance abuse, uncomplicated: Secondary | ICD-10-CM | POA: Diagnosis not present

## 2024-01-19 DIAGNOSIS — I4891 Unspecified atrial fibrillation: Secondary | ICD-10-CM

## 2024-01-19 DIAGNOSIS — I4819 Other persistent atrial fibrillation: Secondary | ICD-10-CM | POA: Diagnosis not present

## 2024-01-19 DIAGNOSIS — I110 Hypertensive heart disease with heart failure: Secondary | ICD-10-CM | POA: Diagnosis not present

## 2024-01-19 DIAGNOSIS — I2510 Atherosclerotic heart disease of native coronary artery without angina pectoris: Secondary | ICD-10-CM | POA: Diagnosis not present

## 2024-01-19 DIAGNOSIS — I5023 Acute on chronic systolic (congestive) heart failure: Secondary | ICD-10-CM

## 2024-01-19 DIAGNOSIS — M25552 Pain in left hip: Secondary | ICD-10-CM | POA: Diagnosis not present

## 2024-01-19 DIAGNOSIS — I361 Nonrheumatic tricuspid (valve) insufficiency: Secondary | ICD-10-CM

## 2024-01-19 HISTORY — PX: TRANSESOPHAGEAL ECHOCARDIOGRAM (CATH LAB): EP1270

## 2024-01-19 HISTORY — PX: CARDIOVERSION: EP1203

## 2024-01-19 LAB — ECHOCARDIOGRAM COMPLETE
Area-P 1/2: 3.13 cm2
Calc EF: 34.7 %
Height: 64 in
MV VTI: 1.11 cm2
S' Lateral: 6 cm
Single Plane A2C EF: 36.9 %
Single Plane A4C EF: 32.1 %
Weight: 3319.25 [oz_av]

## 2024-01-19 LAB — BASIC METABOLIC PANEL WITH GFR
Anion gap: 11 (ref 5–15)
BUN: 17 mg/dL (ref 8–23)
CO2: 26 mmol/L (ref 22–32)
Calcium: 8.9 mg/dL (ref 8.9–10.3)
Chloride: 99 mmol/L (ref 98–111)
Creatinine, Ser: 0.91 mg/dL (ref 0.44–1.00)
GFR, Estimated: >60 mL/min (ref >60)
Glucose, Bld: 115 mg/dL — ABNORMAL HIGH (ref 70–99)
Potassium: 4.3 mmol/L (ref 3.5–5.1)
Sodium: 136 mmol/L (ref 135–145)

## 2024-01-19 SURGERY — TRANSESOPHAGEAL ECHOCARDIOGRAM (TEE) (CATHLAB)
Anesthesia: Monitor Anesthesia Care

## 2024-01-19 MED ORDER — LIDOCAINE 2% (20 MG/ML) 5 ML SYRINGE
INTRAMUSCULAR | Status: DC | PRN
  Administered 2024-01-19: 50 mg via INTRAVENOUS

## 2024-01-19 MED ORDER — PHENYLEPHRINE HCL (PRESSORS) 10 MG/ML IV SOLN
INTRAVENOUS | Status: DC | PRN
Start: 2024-01-19 — End: 2024-01-19
  Administered 2024-01-19: 200 ug via INTRAVENOUS
  Administered 2024-01-19: 120 ug via INTRAVENOUS
  Administered 2024-01-19: 200 ug via INTRAVENOUS
  Administered 2024-01-19: 120 ug via INTRAVENOUS

## 2024-01-19 MED ORDER — EPHEDRINE SULFATE (PRESSORS) 50 MG/ML IJ SOLN
INTRAMUSCULAR | Status: DC | PRN
  Administered 2024-01-19 (×2): 10 mg via INTRAVENOUS
  Administered 2024-01-19: 5 mg via INTRAVENOUS
  Administered 2024-01-19 (×2): 10 mg via INTRAVENOUS

## 2024-01-19 MED ORDER — PROPOFOL 500 MG/50ML IV EMUL
INTRAVENOUS | Status: DC | PRN
  Administered 2024-01-19: 150 ug/kg/min via INTRAVENOUS

## 2024-01-19 MED ORDER — POTASSIUM CHLORIDE CRYS ER 20 MEQ PO TBCR
40.0000 meq | EXTENDED_RELEASE_TABLET | Freq: Once | ORAL | Status: AC
  Administered 2024-01-19: 40 meq via ORAL
  Filled 2024-01-19: qty 2

## 2024-01-19 MED ORDER — PROPOFOL 10 MG/ML IV BOLUS
INTRAVENOUS | Status: DC | PRN
  Administered 2024-01-19: 40 mg via INTRAVENOUS
  Administered 2024-01-19: 10 mg via INTRAVENOUS

## 2024-01-19 MED ORDER — METOLAZONE 2.5 MG PO TABS
2.5000 mg | ORAL_TABLET | Freq: Once | ORAL | Status: AC
  Administered 2024-01-19: 2.5 mg via ORAL
  Filled 2024-01-19: qty 1

## 2024-01-19 SURGICAL SUPPLY — 1 items: PAD DEFIB RADIO PHYSIO CONN (PAD) ×2 IMPLANT

## 2024-01-19 NOTE — Progress Notes (Signed)
 Patient ID: Autumn [name], female   DOB: 1956-04-30, 68 y.o.   MRN: [id number]     Advanced Heart Failure Rounding Note  Cardiologist: Kardie Tobb, DO  Chief Complaint: CHF Subjective:    Patient says she urinated a lot overnight but unfortunately I/Os were not recorded.   TEE-guided DCCV done today, she went back into NSR on 2nd shock.  TEE showed LV EF 30-35%, moderate LV dilation, mild RV dysfunction, moderate TR, severe mitral regurgitation with restriction of the posterior leaflet and malcoaptation.    Objective:   Weight Range: 94.1 kg Body mass index is 35.61 kg/m.   Vital Signs:   Temp:  [97.2 F (36.2 C)-98.8 F (37.1 C)] 97.2 F (36.2 C) (06/11 0805) Pulse Rate:  [82-115] 115 (06/11 0820) Resp:  [18-23] 21 (06/11 0845) BP: (85-127)/(66-99) 105/84 (06/11 0845) SpO2:  [92 %-96 %] 95 % (06/11 0820) Weight:  [94.1 kg] 94.1 kg (06/11 0534) Last BM Date : 01/17/24  Weight change: Filed Weights   01/17/24 2324 01/18/24 0511 01/19/24 0534  Weight: 89.1 kg 93.3 kg 94.1 kg    Intake/Output:   Intake/Output Summary (Last 24 hours) at 01/19/2024 1008 Last data filed at 01/19/2024 0600 Gross per 24 hour  Intake 1085.09 ml  Output 600 ml  Net 485.09 ml      Physical Exam    General:  Well appearing. No resp difficulty HEENT: Normal Neck: Supple. JVP 16 cm. Carotids 2+ bilat; no bruits. No lymphadenopathy or thyromegaly appreciated. Cor: PMI nondisplaced. Mildly tachy, irregular rate & rhythm. No rubs, gallops or murmurs. Lungs: Clear Abdomen: Soft, nontender, nondistended. No hepatosplenomegaly. No bruits or masses. Good bowel sounds. Extremities: No cyanosis, clubbing, rash. 1+ ankle edema.  Neuro: Alert & orientedx3, cranial nerves grossly intact. moves all 4 extremities w/o difficulty. Affect pleasant   Telemetry   Atrial fibrillation rate 100s => NSR 80s (personally reviewed)    Labs    CBC Recent Labs    01/17/24 1034 01/17/24 1856  01/18/24 0421  WBC 7.6  --  7.8  HGB 12.0 12.2 12.9  HCT 36.5 36.0 39.7  MCV 91.0  --  89.4  PLT 316  --  357   Basic Metabolic Panel Recent Labs    40/98/11 0421 01/19/24 0355  NA 136 136  K 3.2* 4.3  CL 96* 99  CO2 28 26  GLUCOSE 167* 115*  BUN 8 17  CREATININE 0.86 0.91  CALCIUM  9.1 8.9   Liver Function Tests Recent Labs    01/17/24 1034  AST 34  ALT 68*  ALKPHOS 60  BILITOT 1.0  PROT 6.0*  ALBUMIN 2.7*   No results for input(s): LIPASE, AMYLASE in the last 72 hours. Cardiac Enzymes No results for input(s): CKTOTAL, CKMB, CKMBINDEX, TROPONINI in the last 72 hours.  BNP: BNP (last 3 results) Recent Labs    07/02/23 1153 08/06/23 1802 01/17/24 1034  BNP 411.5* 238.2* 826.3*    ProBNP (last 3 results) No results for input(s): PROBNP in the last 8760 hours.   D-Dimer No results for input(s): DDIMER in the last 72 hours. Hemoglobin A1C No results for input(s): HGBA1C in the last 72 hours. Fasting Lipid Panel No results for input(s): CHOL, HDL, LDLCALC, TRIG, CHOLHDL, LDLDIRECT in the last 72 hours. Thyroid Function Tests No results for input(s): TSH, T4TOTAL, T3FREE, THYROIDAB in the last 72 hours.  Invalid input(s): FREET3  Other results:   Imaging    No results found.   Medications:  Scheduled Medications:  [MAR Hold] acetaminophen   650 mg Oral QID   [MAR Hold] atorvastatin   80 mg Oral Daily   [MAR Hold] digoxin   0.125 mg Oral Daily   [MAR Hold] furosemide   80 mg Intravenous BID   [MAR Hold] lidocaine   1 patch Transdermal Q24H   [MAR Hold] metoprolol  succinate  25 mg Oral Daily   [MAR Hold] nicotine   21 mg Transdermal Daily   [MAR Hold] rivaroxaban   20 mg Oral Q supper   [MAR Hold] sacubitril -valsartan   1 tablet Oral BID   [MAR Hold] spironolactone   25 mg Oral Daily    Infusions:  sodium chloride      amiodarone  30 mg/hr (01/18/24 2122)    PRN Medications: [MAR Hold] gabapentin , [MAR  Hold] hydrOXYzine , [MAR Hold] ipratropium-albuterol , [MAR Hold] mouth rinse, [MAR Hold] polyethylene glycol    Assessment/Plan   1. Acute on chronic systolic CHF: Echo in 6/22 with EF 60-65%.  Had multiple echoes in 2024, each with lower EF.  Echo in 10/24 with EF 30-35%, moderate-severe MR.  She has a history of CAD but denies recent chest pain.  Cath 12/24 showed significant CAD but no critical lesion.  TEE 12/24 with EF 25-30%, moderate RV dysfunction, moderate MR. 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  TEE today showed EF 30-35%, moderate LV dilation, mild RV dysfunction, moderate TR, severe mitral regurgitation with restriction of the posterior leaflet and malcoaptation. She required milrinone  with diuresis in 12/24.  She is readmitted in AF/RVR (last ECG in 1/25 also showed AF) off all her medications with significant volume overload on exam.  Creatinine stable at 0.91. She remains volume overloaded.  - Need I/Os recorded on floor.  - Lasix  80 mg IV bid again today and will give a dose of metolazone 2.5 x 1 again today.  - Continue spironolactone  25 daily - Continue digoxin  0.125 daily - Can continue lower dose of Toprol  XL (25 mg daily).  - Continue Entresto  24/26 bid today.   - Eventually start Farxiga  => tomorrow if remains stable.  - She is not a candidate for advanced therapies with active substance abuse and noncompliance.  2. Atrial fibrillation: Persistent, multiple admissions with AF/RVR.  Cardioverted to NSR in 12/24 but stopped amiodarone  and Xarelto  when she went home and went back into AF.  She was in AF in 1/25, no ECGs after that until this admission when she was in AF with RVR.  I worry that she has a component of tachy-mediated CMP.  I do not think that she tolerates AF well.  She had TEE-guided DCCV today and is back in NSR.  - Continue amiodarone  gtt today, possibly to po tomorrow. - Restarted low dose Toprol  XL  25 daily and digoxin  0.125 daily to help with rate control.   - Xarelto  restarted.  - Would favor eventual AF ablation.  3. 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 Moderate on TEE 08/10/23.  Severe mitral regurgitation on TEE today with restricted posterior leaflet and malcoaptation.  - Will need to repeat echo in NSR and after diuresis, may end up benefiting from mTEER.   4. CAD: DES to LAD in 9/20.  No recent chest pain. LHC 12/24 showed patent LAD stent, 75% mid LCx stenosis and heavily calcified 60% proximal RCA stenosis. Managed medically given no critical lesion and no chest pain. Troponin minimally elevated with no trend.  - Continue atorvastatin .  - No ASA with Xarelto  use.  5.  COPD: She smokes and uses home oxygen  at times.  - Needs to quit smoking.  6. Sciatica: Significant chronic pain.  Per [facility].  7. OSA: Suspected.  - Will need sleep study.  8. PFO: Small, noted on TEE.  9. Substance abuse: Cocaine in past though UDS did not show cocaine this admission.  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  Self-medicating for depression and back pain.  - Needs social work and psychiatry.   Length of Stay: 1  Peder Bourdon, MD  01/19/2024, 10:08 AM  Advanced Heart Failure Team Pager [phone] (M-F; 7a - 5p)  Please contact [facility] for night-coverage after hours (5p -7a ) and weekends on [email or web address]

## 2024-01-19 NOTE — Anesthesia Postprocedure Evaluation (Signed)
 Anesthesia Post Note  Patient: Autumn [name]  Procedure(s) Performed: TRANSESOPHAGEAL ECHOCARDIOGRAM CARDIOVERSION     Patient location during evaluation: PACU Anesthesia Type: MAC and General Level of consciousness: awake and alert Pain management: pain level controlled Vital Signs Assessment: post-procedure vital signs reviewed and stable Respiratory status: spontaneous breathing, nonlabored ventilation, respiratory function stable and patient connected to nasal cannula oxygen  Cardiovascular status: blood pressure returned to baseline and stable Postop Assessment: no apparent nausea or vomiting Anesthetic complications: no   No notable events documented.  Last Vitals:  Vitals:   01/19/24 1041 01/19/24 1056  BP: (!) 93/52 91/66  Pulse:  77  Resp: (!) 25 20  Temp: (!) 36.4 C (!) 36.4 C  SpO2: 97%     Last Pain:  Vitals:   01/19/24 1056  TempSrc: Oral  PainSc:                  Schylar Wuebker A.

## 2024-01-19 NOTE — Anesthesia Preprocedure Evaluation (Addendum)
 Anesthesia Evaluation  Patient identified by MRN, date of birth, ID band Patient awake    Reviewed: Allergy & Precautions, NPO status , Patient's Chart, lab work & pertinent test results  History of Anesthesia Complications Negative for: history of anesthetic complications  Airway Mallampati: III  TM Distance: >3 FB Neck ROM: Full    Dental  (+) Edentulous Lower, Edentulous Upper   Pulmonary COPD, Current Smoker and Patient abstained from smoking.   breath sounds clear to auscultation + decreased breath sounds      Cardiovascular hypertension, Pt. on medications and Pt. on home beta blockers + CAD, + Peripheral Vascular Disease and +CHF  + dysrhythmias (on Xarelto ) Atrial Fibrillation  Rhythm:Irregular Rate:Normal  Echo 08/10/23  1. Left ventricular ejection fraction, by estimation, is 25 to 30%. The  left ventricle has severely decreased function. The left ventricle  demonstrates global hypokinesis. The left ventricular internal cavity size  was mildly dilated.   2. Peak RV-RA gradient 18 mmHg. Right ventricular systolic function is  mildly reduced. The right ventricular size is normal.   3. Left atrial size was moderately dilated. No left atrial/left atrial  appendage thrombus was detected.   4. The mitral valve is normal in structure. Mild to moderate mitral valve  regurgitation. No evidence of mitral stenosis.   5. The aortic valve is tricuspid. There is mild calcification of the  aortic valve. Aortic valve regurgitation is not visualized. No aortic  stenosis is present.   6. Small PFO by color doppler.   EKG 01/17/24 Atrial fibrillation with rapid ventricular response Nonspecific ST and T wave abnormality   Cath 08/09/23:   Prox RCA lesion is 60% stenosed.   Mid LAD lesion is 20% stenosed.   2nd Diag lesion is 80% stenosed.   Mid Cx lesion is 75% stenosed.   1. Elevated right and left heart filling pressures.   2. Mixed pulmonary venous/pulmonary arterial hypertension.  3. Low PAPi 4. Low CI, 1.7.  5. Patent LAD stent.  75% mid LCx stenosis and heavily calcified 60% proximal RCA stenosis.     Manage CAD medically given no critical lesion and no chest pain.  Will need ongoing aggressive diuresis and will use milrinone  for now with low cardiac output.       Neuro/Psych  PSYCHIATRIC DISORDERS Anxiety Depression    Parkinson's dx  Neuromuscular disease    GI/Hepatic ,GERD  Medicated,,(+)     substance abuse  alcohol use, cocaine use, marijuana use and methamphetamine use  Endo/Other  negative endocrine ROS    Renal/GU negative Renal ROS  negative genitourinary   Musculoskeletal  (+) Arthritis , Osteoarthritis,  Fibromyalgia -  Abdominal  (+) + obese  Peds  Hematology negative hematology ROS (+)   Anesthesia Other Findings Day of surgery medications reviewed with patient.  Reproductive/Obstetrics negative OB ROS                             Anesthesia Physical Anesthesia Plan  ASA: 4  Anesthesia Plan: MAC   Post-op Pain Management: Minimal or no pain anticipated   Induction:   PONV Risk Score and Plan: 2 and Treatment may vary due to age or medical condition and Propofol  infusion  Airway Management Planned: Natural Airway and Nasal Cannula  Additional Equipment:   Intra-op Plan:   Post-operative Plan:   Informed Consent: I have reviewed the patients History and Physical, chart, labs and discussed the procedure including the  risks, benefits and alternatives for the proposed anesthesia with the patient or authorized representative who has indicated his/her understanding and acceptance.     Dental advisory given  Plan Discussed with: CRNA  Anesthesia Plan Comments:        Anesthesia Quick Evaluation

## 2024-01-19 NOTE — CV Procedure (Signed)
 Procedure: TEE  Indication: Atrial fibrillation  Sedation: Per anesthesiology  Findings: Please see echo section for full report.  Mildly dilated left ventricle with EF 30-35%, diffuse hypokinesis.  Normal RV size with mildly decreased systolic function.  Moderate left atrial enlargement, no LA appendage thrombus.  Moderate right atrial enlargement.  Small PFO noted by color doppler.   Severe mitral regurgitation with restriction of posterior leaflet, there is a malcoaptation of the leaflets.  PISA ERO 0.8 cm^2.  There is systolic flow reversal in 2/3 pulmonary veins sampled.  Moderate tricuspid regurgitation.  Trileaflet aortic valve, no stenosis/trivial regurgitation.   Proceed to DCCV.   Peder Bourdon 01/19/2024 10:03 AM

## 2024-01-19 NOTE — Progress Notes (Addendum)
 HD#1 SUBJECTIVE:  Patient Summary: Autumn [name] is a 68 y.o. with a pertinent PMH of HFrEF, A-fib on Xarelto , attention deficit disorder, COPD, chronic hypoxic respiratory failure on 2 L [place], anxiety and depression, fibromyalgia, HLD, and CAD , who presented with acute on chronic left hip pain and admitted for A-fib with RVR and HFrEF exacerbation.   Overnight Events and Interim History: NEO. Complains of dry mouth as she is NPO for DCCV today. And she is also nervous about that. Denies pain, dyspnea, palpitations.  OBJECTIVE:  Vital Signs: Vitals:   01/18/24 2004 01/18/24 2349 01/19/24 0534 01/19/24 0805  BP: (!) 113/99 (!) 85/68 93/72 96/67   Pulse: 82 100 96 (!) 104  Resp: 18 18 18 19   Temp: 98.8 F (37.1 C) 98 F (36.7 C) (!) 97.5 F (36.4 C) (!) 97.2 F (36.2 C)  TempSrc: Oral Oral Oral Temporal  SpO2: 93% 94% 94% 92%  Weight:   94.1 kg   Height:       Supplemental O2: Nasal Cannula SpO2: 92 % O2 Flow Rate (L/min): 2 L/min  Filed Weights   01/17/24 2324 01/18/24 0511 01/19/24 0534  Weight: 89.1 kg 93.3 kg 94.1 kg     Intake/Output Summary (Last 24 hours) at 01/19/2024 0809 Last data filed at 01/19/2024 0600 Gross per 24 hour  Intake 1335.09 ml  Output 600 ml  Net 735.09 ml   Net IO Since Admission: 535.09 mL [01/19/24 0809]  Physical Exam: Physical Exam Constitutional:      General: She is not in acute distress.    Appearance: She is obese. She is not ill-appearing.  Cardiovascular:     Rate and Rhythm: Regular rhythm. Tachycardia present.     Pulses: Normal pulses.  Pulmonary:     Effort: Pulmonary effort is normal.     Breath sounds: Wheezing present.  Abdominal:     General: Abdomen is flat.     Tenderness: There is no abdominal tenderness.  Musculoskeletal:     Right lower leg: Edema present.     Left lower leg: Edema present.     Comments: 2+ LE edema that is improved from yesterday.  Skin:    General: Skin is warm and dry.  Neurological:      General: No focal deficit present.     Mental Status: She is alert and oriented to person, place, and time.     Comments: Appropriately oriented. Not as lethargic as yesterday. Stays alert during interview. Still appears tired.  Psychiatric:        Mood and Affect: Mood normal.        Behavior: Behavior normal.   Patient Lines/Drains/Airways Status     Active Line/Drains/Airways     Name Placement date Placement time Site Days   Peripheral IV 01/17/24 20 G Anterior;Left Forearm 01/17/24  1030  Forearm  2             ASSESSMENT/PLAN:  Assessment: Principal Problem:   Atrial fibrillation with RVR (HCC) Active Problems:   Polysubstance abuse (HCC)   Tobacco dependence   COPD (chronic obstructive pulmonary disease) (HCC)   CAD (coronary artery disease) S/P stent to LAD   Acute on chronic systolic CHF (congestive heart failure) (HCC)   Left hip pain  Autumn [name] is a 68 y.o. woman with PMH of HFrEF, A-fib on Xarelto , attention deficit disorder, COPD, chronic hypoxic respiratory failure on 2 L [place], anxiety and depression, fibromyalgia, HLD, and CAD.    She presented  to [facility], ED 6/9 with complaint of acute on chronic left hip pain and was subsequently found to be in A-fib with RVR and volume overloaded.   Plan: Atrial Fibrillation with RVR Cardiology consulting. Remains in Afib. Rates below 100. Stable. Plan TEE/DCCV today. At this time, remains on amiodarone  bolus/infusion. Suspect tachycardia is contributing to her cardiomyopathy. - Continue home anticoagulation with Xarelto  20 daily - Continue toprol  XL 25 daily -- Amiodarone  - Recs per cardiology   Acute HFrEF with EF 25-30% Volume overload Mitral Regurgitation Hx COPD, Chronic hypoxic respiratory failure on 2L O2 Cardiology consulting. On her home level of oxygen  (2L). Not complaining of dyspnea. Good UOP yesterday and no incresase in Cr. - Lasix  80 IV BID - GDMT with spiro 25, toprol  XL 25, Entresto  24/26  daily, eventually Farxiga  - Losartan  12.5 daily - Dixogin 0.125 daily - Strict ins and outs, daily weights.  Dry weight is unclear - DuoNebs every 6 as needed   Acute encephalopathy-hypersomnolence, resolved Concern for sedative overdose Substance use disorder Improving. Alert through interview today, appears tired, AOx3. Suspect this was a drug effect. - VBG has ruled out hypercarbia - Avoid sedating medications including narcotic pain medicine  - Supportive care - Recommend abstinence from substances - TOC for resources    L hip pain, sciatic pain History consistent with sciatic pain.  She does not have emergent symptoms, namely bowel/bladder dysfunction.  Did not complain of it today. - Avoid narcotic pain medicine - Tylenol  as needed - Resume home gabapentin  dose of 600 TID   OSA COPD on 2L at baseline not in exacerbation Diagnosed but does not ear CPAP. - rec outpatient pulm eval   Medication noncompliance She understands that she has many medicines that she is post to take but admits to missing doses often.  She does not give a clear reason why.  She cannot confirm her medication list.  It is possible she has missed many medicines and that this is contributing to her presentation.   CAD Atorvastatin  80 daily. No ASA as on Xarelto .  Cigarette smoker 1 ppd. Will give a nicotine  patch. She should quit.  Best Practice: Diet: Cardiac diet IVF: none VTE: DOAC Code: Full DISPO: Anticipated discharge in 1-3 days to Home pending medical management.  Signature: Carleen Chary, D.O.  Internal Medicine Resident, PGY-1 [facility]  Pager: # [phone]. 8:09 AM, 01/19/2024

## 2024-01-19 NOTE — Transfer of Care (Signed)
 Immediate Anesthesia Transfer of Care Note  Patient: Autumn [name]  Procedure(s) Performed: TRANSESOPHAGEAL ECHOCARDIOGRAM CARDIOVERSION  Patient Location: PACU and Cath Lab  Anesthesia Type:MAC  Level of Consciousness: drowsy  Airway & Oxygen  Therapy: Patient Spontanous Breathing and Patient connected to face mask oxygen   Post-op Assessment: Report given to RN and Post -op Vital signs reviewed and stable  Post vital signs: Reviewed and stable  Last Vitals:  Vitals Value Taken Time  BP 89/57 (68) 01/19/24 1011  Temp    Pulse 71 01/19/24 1011  Resp 23 01/19/24 1011  SpO2 100 % 01/19/24 1011  Vitals shown include unfiled device data.  Last Pain:  Vitals:   01/19/24 0930  TempSrc:   PainSc: 0-No pain         Complications: No notable events documented.

## 2024-01-19 NOTE — Evaluation (Signed)
 Physical Therapy Evaluation Patient Details Name: Autumn [name] MRN: [id number] DOB: 05-12-1956 Today's Date: 01/19/2024  History of Present Illness  Pt is a 68 y.o. female who presented 01/17/24 with acute on chronic L hip pain and was admitted for AF with RVR and HFrEF exacerbation. UDS positive for benzodiazepines, amphetamines, and THC. Imaging of L hip showed no acute osseous abnormality. S/p cardioversion 6/11. PMH of afib, drug abuse, parkinson's disease, COPD, ADD, fibromyalgia, HLD, and HFrEF   Clinical Impression  Pt presents with condition above and deficits mentioned below, see PT Problem List. PTA, she was mod I utilizing her rollator vs quad cane for functional mobility. She lives with multiple family members in a multi-level house with x3 STE and x6 stairs to the shower. She reports she has 24/7 care available at d/c. Currently, the pt is emotionally labile, often crying and distracted by low back and bil leg (L>R) pain. She reports the radicular symptoms traveled more distally with trunk extension and more proximally with trunk flexion. Thus, encouraged pt to perform trunk flexion AROM to tolerance as able throughout day to try to centralize the pain. She is currently demonstrating deficits in gross strength, balance, power, and activity tolerance and reports deficits in L leg sensation. She is currently requiring CGA for safety with transfers and CGA-minA for balance with short household distances with her cane and intermittent contralateral UE support on furniture. She is limited by her pain in her back and legs. Since she has good support at d/c and will likely mobilize better once her pain improves, recommending HHPT upon d/c home. Will continue to follow acutely.        If plan is discharge home, recommend the following: A little help with walking and/or transfers;A little help with bathing/dressing/bathroom;Assistance with cooking/housework;Assist for transportation;Direct  supervision/assist for medications management;Direct supervision/assist for financial management;Help with stairs or ramp for entrance   Can travel by private vehicle        Equipment Recommendations None recommended by PT  Recommendations for Other Services       Functional Status Assessment Patient has had a recent decline in their functional status and demonstrates the ability to make significant improvements in function in a reasonable and predictable amount of time.     Precautions / Restrictions Precautions Precautions: Fall;Other (comment) Precaution/Restrictions Comments: watch SpO2 Restrictions Weight Bearing Restrictions Per Provider Order: No      Mobility  Bed Mobility               General bed mobility comments: Pt sitting up EOB upon arrival and sitting in chair end of session    Transfers Overall transfer level: Needs assistance Equipment used: Quad cane Transfers: Sit to/from Stand Sit to Stand: Contact guard assist           General transfer comment: Pt stood from EOB 2x and from recliner 2x with her cane and no LOB, extra time to power up to stand and improve her posture, CGA for safety    Ambulation/Gait Ambulation/Gait assistance: Contact guard assist, Min assist Gait Distance (Feet): 40 Feet Assistive device: IV Pole, Quad cane Gait Pattern/deviations: Step-through pattern, Decreased stride length, Trunk flexed Gait velocity: reduced Gait velocity interpretation: <1.31 ft/sec, indicative of household ambulator   General Gait Details: Pt takes slow, small steps with her cane in her R hand and intermittently holding onto the IV pole with her L UE. Pt maintains a slightly flexed posture and demonstrates a trunk sway, needing CGA-minA for balance  throughout. Distance limited by pain.  Stairs            Wheelchair Mobility     Tilt Bed    Modified Rankin (Stroke Patients Only)       Balance Overall balance assessment: Needs  assistance Sitting-balance support: No upper extremity supported, Feet supported Sitting balance-Leahy Scale: Good     Standing balance support: Single extremity supported, Bilateral upper extremity supported, During functional activity, Reliant on assistive device for balance Standing balance-Leahy Scale: Poor Standing balance comment: reliant on cane and intermittently other hand on furniture for support along with minA for balance                             Pertinent Vitals/Pain Pain Assessment Pain Assessment: Faces Faces Pain Scale: Hurts whole lot Pain Location: back and legs Pain Descriptors / Indicators: Discomfort, Grimacing, Guarding, Crying, Moaning Pain Intervention(s): Limited activity within patient's tolerance, Monitored during session, Repositioned, RN gave pain meds during session (RN gave pt gabapentin )    Home Living Family/patient expects to be discharged to:: Private residence Living Arrangements: Other relatives (DIL and her children and parents; grandson) Available Help at Discharge: Family;Available 24 hours/day Type of Home: House Home Access: Stairs to enter Entrance Stairs-Rails: Can reach both Entrance Stairs-Number of Steps: 3 Alternate Level Stairs-Number of Steps: 6 Home Layout: Multi-level;Able to live on main level with bedroom/bathroom (shower upstairs; bedside commode downstairs with her bed) Home Equipment: Rollator (4 wheels);Cane - quad;Shower seat;BSC/3in1;Rolling Environmental consultant (2 wheels) (rollator and RW cannot fit around the home) Additional Comments: on 2-3L O2    Prior Function Prior Level of Function : Independent/Modified Independent             Mobility Comments: Uses rollator vs quad cane, rollator and RW cannot fit in the home per pt ADLs Comments: Family drives her; family cooks and cleans, she can do light cooking; mod I bathing and dressing when her sciatica is not causing pain     Extremity/Trunk Assessment   Upper  Extremity Assessment Upper Extremity Assessment: Defer to OT evaluation    Lower Extremity Assessment Lower Extremity Assessment: Generalized weakness;LLE deficits/detail LLE Deficits / Details: reports numbness intermittently down to her knee LLE Sensation: decreased light touch    Cervical / Trunk Assessment Cervical / Trunk Assessment: Normal  Communication   Communication Communication: No apparent difficulties    Cognition Arousal: Alert Behavior During Therapy: Lability   PT - Cognitive impairments: Difficult to assess Difficult to assess due to:  (often crying and distracted by pain)                     PT - Cognition Comments: Difficult to assess cognition due to pt's lability, often crying and distracted by pain. Pt does not seem to recall her pain in her legs could be from her back, as it is referred to in the chart several times this admission and prior admissions. Following commands: Impaired Following commands impaired: Follows multi-step commands with increased time     Cueing Cueing Techniques: Verbal cues     General Comments General comments (skin integrity, edema, etc.): pt reported radicular symptoms traveled more distally with trunk extension and more proximally with trunk flexion, encouraged pt to perform trunk flexion AROM to tolerance as able throughout day to try to centralize the pain; VSS on 3-4L O2    Exercises     Assessment/Plan    PT Assessment  Patient needs continued PT services  PT Problem List Decreased strength;Decreased balance;Decreased activity tolerance;Decreased mobility;Cardiopulmonary status limiting activity;Impaired sensation;Pain       PT Treatment Interventions DME instruction;Gait training;Stair training;Functional mobility training;Therapeutic activities;Therapeutic exercise;Balance training;Neuromuscular re-education;Patient/family education    PT Goals (Current goals can be found in the Care Plan section)  Acute  Rehab PT Goals Patient Stated Goal: to reduce pain PT Goal Formulation: With patient Time For Goal Achievement: 02/02/24 Potential to Achieve Goals: Fair    Frequency Min 2X/week     Co-evaluation               AM-PAC PT 6 Clicks Mobility  Outcome Measure Help needed turning from your back to your side while in a flat bed without using bedrails?: A Little Help needed moving from lying on your back to sitting on the side of a flat bed without using bedrails?: A Little Help needed moving to and from a bed to a chair (including a wheelchair)?: A Little Help needed standing up from a chair using your arms (e.g., wheelchair or bedside chair)?: A Little Help needed to walk in hospital room?: A Little Help needed climbing 3-5 steps with a railing? : A Lot 6 Click Score: 17    End of Session Equipment Utilized During Treatment: Gait belt;Oxygen  Activity Tolerance: Patient tolerated treatment well Patient left: in chair;with call bell/phone within reach;with chair alarm set   PT Visit Diagnosis: Unsteadiness on feet (R26.81);Other abnormalities of gait and mobility (R26.89);Muscle weakness (generalized) (M62.81);Difficulty in walking, not elsewhere classified (R26.2);Pain Pain - Right/Left:  (bil) Pain - part of body: Leg (back)    Time: 1250-1313 PT Time Calculation (min) (ACUTE ONLY): 23 min   Charges:   PT Evaluation $PT Eval Moderate Complexity: 1 Mod PT Treatments $Therapeutic Activity: 8-22 mins PT General Charges $$ ACUTE PT VISIT: 1 Visit         Vernida Goodie, PT, DPT Acute Rehabilitation Services  Office: [phone]   Ellyn Hack 01/19/2024, 1:35 PM

## 2024-01-19 NOTE — Procedures (Addendum)
 Electrical Cardioversion Procedure Note SARALYN WILLISON [id number] 22-Jun-1956  Procedure: Electrical Cardioversion Indications:  Atrial Fibrillation  Procedure Details Consent: Risks of procedure as well as the alternatives and risks of each were explained to the (patient/caregiver).  Consent for procedure obtained. Time Out: Verified patient identification, verified procedure, site/side was marked, verified correct patient position, special equipment/implants available, medications/allergies/relevent history reviewed, required imaging and test results available.  Performed  Patient placed on cardiac monitor, pulse oximetry, supplemental oxygen  as necessary.  Sedation given: Propofol  per anesthesiology Pacer pads placed anterior and posterior chest.  Cardioverted 2 time(s).  Cardioverted at 360J.  Evaluation Findings: Post procedure EKG shows: NSR Complications: None Patient did tolerate procedure well.   Peder Bourdon 01/19/2024, 10:03 AM

## 2024-01-20 DIAGNOSIS — F411 Generalized anxiety disorder: Secondary | ICD-10-CM

## 2024-01-20 DIAGNOSIS — I5023 Acute on chronic systolic (congestive) heart failure: Secondary | ICD-10-CM | POA: Diagnosis not present

## 2024-01-20 DIAGNOSIS — I4819 Other persistent atrial fibrillation: Secondary | ICD-10-CM | POA: Diagnosis not present

## 2024-01-20 DIAGNOSIS — I4891 Unspecified atrial fibrillation: Secondary | ICD-10-CM | POA: Diagnosis not present

## 2024-01-20 DIAGNOSIS — M25552 Pain in left hip: Secondary | ICD-10-CM | POA: Diagnosis not present

## 2024-01-20 LAB — CBC
HCT: 44.9 % (ref 36.0–46.0)
Hemoglobin: 14.1 g/dL (ref 12.0–15.0)
MCH: 29.1 pg (ref 26.0–34.0)
MCHC: 31.4 g/dL (ref 30.0–36.0)
MCV: 92.6 fL (ref 80.0–100.0)
Platelets: 337 10*3/uL (ref 150–400)
RBC: 4.85 MIL/uL (ref 3.87–5.11)
RDW: 14.8 % (ref 11.5–15.5)
WBC: 10.6 10*3/uL — ABNORMAL HIGH (ref 4.0–10.5)
nRBC: 0 % (ref 0.0–0.2)

## 2024-01-20 LAB — BASIC METABOLIC PANEL WITH GFR
Anion gap: 10 (ref 5–15)
BUN: 16 mg/dL (ref 8–23)
CO2: 30 mmol/L (ref 22–32)
Calcium: 8.8 mg/dL — ABNORMAL LOW (ref 8.9–10.3)
Chloride: 100 mmol/L (ref 98–111)
Creatinine, Ser: 0.93 mg/dL (ref 0.44–1.00)
GFR, Estimated: >60 mL/min (ref >60)
Glucose, Bld: 107 mg/dL — ABNORMAL HIGH (ref 70–99)
Potassium: 3.5 mmol/L (ref 3.5–5.1)
Sodium: 140 mmol/L (ref 135–145)

## 2024-01-20 LAB — MAGNESIUM: Magnesium: 1.6 mg/dL — ABNORMAL LOW (ref 1.7–2.4)

## 2024-01-20 MED ORDER — POTASSIUM CHLORIDE CRYS ER 20 MEQ PO TBCR
40.0000 meq | EXTENDED_RELEASE_TABLET | Freq: Once | ORAL | Status: AC
  Administered 2024-01-20: 40 meq via ORAL
  Filled 2024-01-20: qty 2

## 2024-01-20 MED ORDER — FUROSEMIDE 10 MG/ML IJ SOLN: 160.0000 mg | Freq: Two times a day (BID) | INTRAVENOUS | Status: DC

## 2024-01-20 MED ORDER — SERTRALINE HCL 50 MG PO TABS: 25.0000 mg | ORAL_TABLET | Freq: Every day | ORAL | Status: DC

## 2024-01-20 MED ORDER — METOLAZONE 5 MG PO TABS
2.5000 mg | ORAL_TABLET | Freq: Once | ORAL | Status: AC
  Administered 2024-01-20: 2.5 mg via ORAL
  Filled 2024-01-20: qty 1

## 2024-01-20 MED ORDER — FUROSEMIDE 10 MG/ML IJ SOLN
120.0000 mg | Freq: Two times a day (BID) | INTRAVENOUS | Status: DC
  Filled 2024-01-20: qty 12

## 2024-01-20 MED ORDER — DULOXETINE HCL 30 MG PO CPEP
30.0000 mg | ORAL_CAPSULE | Freq: Every day | ORAL | Status: DC
  Administered 2024-01-20 – 2024-01-21 (×2): 30 mg via ORAL
  Filled 2024-01-20 (×2): qty 1

## 2024-01-20 MED ORDER — MAGNESIUM SULFATE 2 GM/50ML IV SOLN
2.0000 g | Freq: Once | INTRAVENOUS | Status: AC
  Administered 2024-01-20: 2 g via INTRAVENOUS
  Filled 2024-01-20: qty 50

## 2024-01-20 MED ORDER — DAPAGLIFLOZIN PROPANEDIOL 10 MG PO TABS
10.0000 mg | ORAL_TABLET | Freq: Every day | ORAL | Status: DC
  Administered 2024-01-20 – 2024-01-22 (×3): 10 mg via ORAL
  Filled 2024-01-20 (×3): qty 1

## 2024-01-20 MED ORDER — FUROSEMIDE 10 MG/ML IJ SOLN
15.0000 mg/h | INTRAVENOUS | Status: DC
  Administered 2024-01-20 – 2024-01-21 (×2): 15 mg/h via INTRAVENOUS
  Filled 2024-01-20 (×2): qty 20

## 2024-01-20 MED ORDER — GABAPENTIN 300 MG PO CAPS
600.0000 mg | ORAL_CAPSULE | Freq: Three times a day (TID) | ORAL | Status: DC
  Administered 2024-01-20 – 2024-01-22 (×5): 600 mg via ORAL
  Filled 2024-01-20 (×7): qty 2

## 2024-01-20 NOTE — Progress Notes (Signed)
   01/20/24 1933  Vitals  Temp 97.6 F (36.4 C)  Temp Source Oral  BP 92/65  MAP (mmHg) 74  BP Location Left Arm  BP Method Automatic  Patient Position (if appropriate) Lying  Pulse Rate (!) 57  Pulse Rate Source Monitor  ECG Heart Rate 60  Resp 18  MEWS COLOR  MEWS Score Color Green  Oxygen  Therapy  SpO2 96 %  O2 Device Nasal Cannula  MEWS Score  MEWS Temp 0  MEWS Systolic 1  MEWS Pulse 0  MEWS RR 0  MEWS LOC 0  MEWS Score 1   Amio drip placed on hold. MD Teofilo Fellers made aware via text page.

## 2024-01-20 NOTE — TOC Progression Note (Signed)
 Transition of Care [facility]) - Progression Note    Patient Details  Name: Autumn [name] MRN: [id number] Date of Birth: 09-15-1955  Transition of Care [facility]) CM/SW Contact  Ernst Heap Phone Number: [phone] 01/20/2024, 12:29 PM  Clinical Narrative: HF CSW met with patient at bedside. Patient stated at this time she was good. Patient appeared to be in some discomfort. CSW asked if the patient needed any assistance she said no. HF CSW/NCM will continue to follow.   TOC will continue following.       Expected Discharge Plan: Home/Self Care Barriers to Discharge: Continued Medical Work up  Expected Discharge Plan and Services       Living arrangements for the past 2 months: Single Family Home                                       Social Determinants of Health (SDOH) Interventions SDOH Screenings   Food Insecurity: No Food Insecurity (01/18/2024)  Housing: Low Risk  (01/18/2024)  Transportation Needs: No Transportation Needs (01/18/2024)  Utilities: Not At Risk (01/18/2024)  Alcohol Screen: Low Risk  (06/02/2023)  Financial Resource Strain: Low Risk  (08/19/2023)   Received from [facility]  Physical Activity: Unknown (08/19/2023)   Received from [facility]  Social Connections: Unknown (01/18/2024)  Stress: No Stress Concern Present (08/19/2023)   Received from [facility]  Tobacco Use: High Risk (01/17/2024)    Readmission Risk Interventions    01/21/2023    1:29 PM 12/07/2022    1:27 PM  Readmission Risk Prevention Plan  Transportation Screening Complete Complete  PCP or Specialist Appt within 3-5 Days  Complete  HRI or Home Care Consult  Complete  Social Work Consult for Recovery Care Planning/Counseling  Complete  Palliative Care Screening  Not Applicable  Medication Review Oceanographer) Complete Complete  PCP or Specialist appointment within 3-5 days of discharge Complete   HRI or Home Care Consult Complete   SW Recovery Care/Counseling  Consult Complete   Palliative Care Screening Not Applicable   Skilled Nursing Facility Not Applicable

## 2024-01-20 NOTE — Progress Notes (Signed)
 Received page from nurse about soft blood pressures and patient being bradycardic. Metropolol dose was reduced today, she is on amiodarone  drip. Per cardiology, amiodarone  drip being held tonight and she can start po amiodarone  tomorrow.   Autumn Brathwaite Alexander-Savino,MD  PGY-1

## 2024-01-20 NOTE — Progress Notes (Signed)
 On call  cardiology.   Nursing staff reached out with concerns of soft blood pressures and bradycardia.  Patient was noted to have systolic blood pressures in the 90-100 mmHg with heart rates close to 50 bpm.  Review of electronic medical records notes that she does run soft blood pressure readings.  Clinically is asymptomatic.  Amiodarone  was already held secondary to concerns of hypotension and bradycardia.    Review of progress note illustrates that there were anticipating transitioning to p.o. amnio in the morning.  Will hold off on amiodarone  drip for now if SBP is greater than 110 mmHg recommending restarting amiodarone  drip at the maintenance dose.  Lyrical S[place], DO, [facility] 8:32 PM

## 2024-01-20 NOTE — Evaluation (Signed)
 Occupational Therapy Evaluation Patient Details Name: Autumn [name] MRN: [id number] DOB: 02/16/1956 Today's Date: 01/20/2024   History of Present Illness   Pt is a 68 y.o. female who presented 01/17/24 with acute on chronic L hip pain and was admitted for AF with RVR and HFrEF exacerbation. UDS positive for benzodiazepines, amphetamines, and THC. Imaging of L hip showed no acute osseous abnormality. S/p cardioversion 6/11. PMH of afib, drug abuse, parkinson's disease, COPD, ADD, fibromyalgia, HLD, and HFrEF     Clinical Impressions Pt admitted based on above, and was seen based on problem list below. PTA pt reports Mod I with ADLs. Today pt is requiring set up  to min assist for ADLs. Bed mobility was CGA and functional transfers are  min  assist. Pt perseverating on back pain, during session, educated pt on back precautions for comfort and importance for positioning. No family present to determine cog baseline, but pt presenting with decreased safety awareness, judgement, and insight into deficits. Pt able to complete self-care tasks, just requires assist for standing balance, especially when pain is increased. Adequate family support available upon d/c, if needed. No follow up OT or DME needs. OT will continue to follow acutely to maximize functional independence.        If plan is discharge home, recommend the following:   A little help with walking and/or transfers;A little help with bathing/dressing/bathroom     Functional Status Assessment   Patient has had a recent decline in their functional status and demonstrates the ability to make significant improvements in function in a reasonable and predictable amount of time.     Equipment Recommendations   None recommended by OT     Recommendations for Other Services         Precautions/Restrictions   Precautions Precautions: Fall Recall of Precautions/Restrictions: Impaired Precaution/Restrictions Comments: watch  SpO2 Restrictions Weight Bearing Restrictions Per Provider Order: No     Mobility Bed Mobility Overal bed mobility: Needs Assistance Bed Mobility: Supine to Sit     Supine to sit: Contact guard     General bed mobility comments: No assist, increased time    Transfers Overall transfer level: Needs assistance Equipment used: Quad cane Transfers: Sit to/from Stand, Bed to chair/wheelchair/BSC Sit to Stand: Contact guard assist     Step pivot transfers: Min assist     General transfer comment: min assist to steady during mobillity      Balance Overall balance assessment: Needs assistance Sitting-balance support: No upper extremity supported, Feet supported Sitting balance-Leahy Scale: Good     Standing balance support: Single extremity supported, Bilateral upper extremity supported, During functional activity, Reliant on assistive device for balance Standing balance-Leahy Scale: Poor Standing balance comment: Reliant on external support       ADL either performed or assessed with clinical judgement   ADL Overall ADL's : Needs assistance/impaired Eating/Feeding: Set up;Sitting   Grooming: Set up;Sitting           Upper Body Dressing : Set up;Sitting   Lower Body Dressing: Minimal assistance;Sit to/from stand Lower Body Dressing Details (indicate cue type and reason): Pt able to figure 4 for socks, min assist to steady in standing Toilet Transfer: Minimal assistance;Contact guard assist (Quad cane) Toilet Transfer Details (indicate cue type and reason): Simulated in room, min steadying assist Toileting- Clothing Manipulation and Hygiene: Minimal assistance;Sit to/from stand       Functional mobility during ADLs: Minimal assistance;Cane General ADL Comments: Pt tends to perseverate on pain, but  able to complete ADLs with minimal assitance     Vision Baseline Vision/History: 1 Wears glasses Vision Assessment?: No apparent visual deficits             Pertinent Vitals/Pain Pain Assessment Pain Assessment: 0-10 Pain Score: 5  Pain Location: Back and LEs Pain Descriptors / Indicators: Discomfort, Grimacing Pain Intervention(s): Repositioned     Extremity/Trunk Assessment Upper Extremity Assessment Upper Extremity Assessment: Generalized weakness   Lower Extremity Assessment Lower Extremity Assessment: Defer to PT evaluation   Cervical / Trunk Assessment Cervical / Trunk Assessment: Normal   Communication Communication Communication: Impaired Factors Affecting Communication: Reduced clarity of speech   Cognition Arousal: Alert Behavior During Therapy: Flat affect Cognition: Cognition impaired     Awareness: Intellectual awareness intact, Online awareness impaired   Attention impairment (select first level of impairment): Selective attention Executive functioning impairment (select all impairments): Reasoning, Problem solving OT - Cognition Comments: Pt with delayed processing, poor safety awareness, tends to perseverate on back pain                 Following commands: Impaired Following commands impaired: Follows multi-step commands with increased time     Cueing  General Comments   Cueing Techniques: Verbal cues  Pt maintained O2 levels during short mobility on 3L supplemental O2           Home Living Family/patient expects to be discharged to:: Private residence Living Arrangements: Other relatives (DIL and her children, parents, grandson) Available Help at Discharge: Family;Available 24 hours/day Type of Home: House Home Access: Stairs to enter [employer] of Steps: 3 Entrance Stairs-Rails: Can reach both Home Layout: Multi-level;Able to live on main level with bedroom/bathroom (Shower upstairs, bsc downstairs with her bed) Alternate Level Stairs-Number of Steps: 6 Alternate Level Stairs-Rails: Can reach both Bathroom Shower/Tub: Tub/shower unit;Curtain   Bathroom Toilet:  Standard Bathroom Accessibility: No   Home Equipment: Rollator (4 wheels);Cane - quad;Shower Scientist, physiological (2 wheels) (per pt RW and rollator will not fit around home)   Additional Comments: on 2-3L O2 at baseline      Prior Functioning/Environment Prior Level of Function : Independent/Modified Independent             Mobility Comments: Uses rollator vs quad cane, rollator and RW cannot fit in the home per pt ADLs Comments: Family completes IADLs, mod I bathing and dressing when her sciatica is not causing pain    OT Problem List: Decreased strength;Decreased activity tolerance;Decreased range of motion;Impaired balance (sitting and/or standing);Decreased cognition;Decreased safety awareness;Decreased knowledge of use of DME or AE   OT Treatment/Interventions: Self-care/ADL training;Therapeutic exercise;Energy conservation;DME and/or AE instruction;Therapeutic activities;Patient/family education;Balance training      OT Goals(Current goals can be found in the care plan section)   Acute Rehab OT Goals Patient Stated Goal: To be in less pain OT Goal Formulation: With patient Time For Goal Achievement: 02/03/24 Potential to Achieve Goals: Good   OT Frequency:  Min 2X/week       AM-PAC OT 6 Clicks Daily Activity     Outcome Measure Help from another person eating meals?: None Help from another person taking care of personal grooming?: A Little Help from another person toileting, which includes using toliet, bedpan, or urinal?: A Little Help from another person bathing (including washing, rinsing, drying)?: A Little Help from another person to put on and taking off regular upper body clothing?: A Little Help from another person to put on and taking off regular lower body clothing?: A  Little 6 Click Score: 19   End of Session Equipment Utilized During Treatment: Gait belt;Rolling walker (2 wheels) Nurse Communication: Mobility status  Activity Tolerance:  Patient limited by pain Patient left: in chair;with call bell/phone within reach;with chair alarm set  OT Visit Diagnosis: Unsteadiness on feet (R26.81);Other abnormalities of gait and mobility (R26.89)                Time: 6295-2841 OT Time Calculation (min): 24 min Charges:  OT General Charges $OT Visit: 1 Visit OT Evaluation $OT Eval Moderate Complexity: 1 Mod  Delmer Ferraris, OT  Acute Rehabilitation Services Office [phone] Secure chat preferred   Mickael Alamo 01/20/2024, 3:43 PM

## 2024-01-20 NOTE — Progress Notes (Signed)
 Physical Therapy Treatment Patient Details Name: Autumn [name] MRN: [id number] DOB: 06-16-1956 Today's Date: 01/20/2024   History of Present Illness Pt is a 68 y.o. female who presented 01/17/24 with acute on chronic L hip pain and was admitted for AF with RVR and HFrEF exacerbation. UDS positive for benzodiazepines, amphetamines, and THC. Imaging of L hip showed no acute osseous abnormality. S/p cardioversion 6/11. PMH of afib, drug abuse, parkinson's disease, COPD, ADD, fibromyalgia, HLD, and HFrEF    PT Comments  Pt greeted seated EOB, pleasant and agreeable to PT session. She increased her gait distance ambulating ~31ft using cane with CGA-minA. Pt continues to be emotionally labile, often crying and distract by pain. Frequent re-direction and re-orientation to task required. She is able to be consoled intermittently. Pt needs to practice stairs next session. Will continue to follow acutely and advance appropriately.       If plan is discharge home, recommend the following: A little help with walking and/or transfers;A little help with bathing/dressing/bathroom;Assistance with cooking/housework;Assist for transportation;Direct supervision/assist for medications management;Direct supervision/assist for financial management;Help with stairs or ramp for entrance   Can travel by private vehicle        Equipment Recommendations  None recommended by PT    Recommendations for Other Services       Precautions / Restrictions Precautions Precautions: Fall Precaution/Restrictions Comments: watch SpO2 Restrictions Weight Bearing Restrictions Per Provider Order: No     Mobility  Bed Mobility               General bed mobility comments: Not assessed. Pt greeted seated EOB.    Transfers Overall transfer level: Needs assistance Equipment used: Quad cane Transfers: Sit to/from Stand Sit to Stand: Contact guard assist           General transfer comment: Pt stood from lowest bed  height and chair with CGA to power up. Pt took increased time to reach upright posture. Good eccentric control with sitting.    Ambulation/Gait Ambulation/Gait assistance: Contact guard assist, Min assist Gait Distance (Feet): 80 Feet Assistive device: Quad cane Gait Pattern/deviations: Step-through pattern, Decreased stride length, Trunk flexed Gait velocity: reduced Gait velocity interpretation: <1.31 ft/sec, indicative of household ambulator   General Gait Details: Pt ambulated with cane in RUE. She advanced cane followed by LLE and then RLE. She took slow, small steps and maintained a fwd lean. Pt demonstrated slight trunk sway, requiring CGA-minA for safety and stability. Cued PLB technique throughout. Pt took intermittent standing rest breaks.  Distance limited by pain.   Stairs             Wheelchair Mobility     Tilt Bed    Modified Rankin (Stroke Patients Only)       Balance Overall balance assessment: Needs assistance Sitting-balance support: No upper extremity supported, Feet supported Sitting balance-Leahy Scale: Good     Standing balance support: Single extremity supported, Bilateral upper extremity supported, During functional activity, Reliant on assistive device for balance Standing balance-Leahy Scale: Poor Standing balance comment: Pt dependent on cane and required CGA-minA for stability.                            Communication Communication Communication: No apparent difficulties  Cognition Arousal: Alert Behavior During Therapy: Lability   PT - Cognitive impairments: No family/caregiver present to determine baseline, Sequencing, Problem solving, Safety/Judgement, Initiation  PT - Cognition Comments: Difficult to assess cognition due to pt's emotional lability, often crying and distracted by pain. Pt didn't recall being evaluated by PT yesterday, denied walking, and was unaware of an exercise to aid in  relieving her pain. Frequent re-direction and re-orientation to task required. Provided consolment throughout session. Following commands: Impaired Following commands impaired: Follows multi-step commands with increased time    Cueing Cueing Techniques: Verbal cues  Exercises      General Comments General comments (skin integrity, edema, etc.): Pt greeted on 5L O2, SpO2 98%. She desaturated with activity, SpO2 82-85% requiring bump up to 8L to recover SpO2 >88%. Reviewed seated trunk flexion AROM exercise and encouaged pt to perform the activity throughout the day to aid in centralizing her pain.      Pertinent Vitals/Pain Pain Assessment Pain Assessment: 0-10 Pain Score: 8  Pain Location: Back and LEs Pain Descriptors / Indicators: Discomfort, Grimacing, Guarding, Crying, Moaning Pain Intervention(s): Monitored during session, Limited activity within patient's tolerance, Repositioned    Home Living                          Prior Function            PT Goals (current goals can now be found in the care plan section) Acute Rehab PT Goals Patient Stated Goal: Have less pain    Frequency    Min 2X/week      PT Plan      Co-evaluation              AM-PAC PT 6 Clicks Mobility   Outcome Measure  Help needed turning from your back to your side while in a flat bed without using bedrails?: A Little Help needed moving from lying on your back to sitting on the side of a flat bed without using bedrails?: A Little Help needed moving to and from a bed to a chair (including a wheelchair)?: A Little Help needed standing up from a chair using your arms (e.g., wheelchair or bedside chair)?: A Little Help needed to walk in hospital room?: A Little Help needed climbing 3-5 steps with a railing? : A Lot 6 Click Score: 17    End of Session Equipment Utilized During Treatment: Gait belt;Oxygen  Activity Tolerance: Patient tolerated treatment well;Patient limited by  pain Patient left: in chair;with call bell/phone within reach;with chair alarm set Nurse Communication: Mobility status PT Visit Diagnosis: Unsteadiness on feet (R26.81);Other abnormalities of gait and mobility (R26.89);Muscle weakness (generalized) (M62.81);Difficulty in walking, not elsewhere classified (R26.2);Pain Pain - Right/Left:  (Both) Pain - part of body: Leg (Back)     Time: 4696-2952 PT Time Calculation (min) (ACUTE ONLY): 26 min  Charges:    $Gait Training: 23-37 mins PT General Charges $$ ACUTE PT VISIT: 1 Visit                     Glenford Lanes, PT, DPT Acute Rehabilitation Services Office: [phone] Secure Chat Preferred  Riva Chester 01/20/2024, 9:59 AM

## 2024-01-20 NOTE — Progress Notes (Signed)
 HD#2 SUBJECTIVE:  Patient Summary: Autumn [name] is a 68 y.o. with a pertinent PMH of HFrEF, A-fib on Xarelto , attention deficit disorder, COPD, chronic hypoxic respiratory failure on 2 L [place], anxiety and depression, fibromyalgia, HLD, and CAD , who presented with acute on chronic left hip pain and admitted for A-fib with RVR and HFrEF exacerbation.   Overnight Events and Interim History: NEO. Since yesterday, cardioverted. In room she is very anxious and tearful, referencing worries about everything (less her health, more family/social situations). Denies CP, dyspnea, worsening edema.  OBJECTIVE:  Vital Signs: Vitals:   01/20/24 0400 01/20/24 0743 01/20/24 0800 01/20/24 0953  BP: 101/63 118/63 94/78 119/82  Pulse:  70    Resp: 18 18    Temp: 97.6 F (36.4 C)     TempSrc: Oral Oral    SpO2: 92%  100% 91%  Weight:      Height:       Supplemental O2: Room Air Sitting in bedside chair without [place] in place  [place] Weights   01/18/24 0511 01/19/24 0534 01/20/24 0318  Weight: 93.3 kg 94.1 kg 93.8 kg     Intake/Output Summary (Last 24 hours) at 01/20/2024 1020 Last data filed at 01/20/2024 1610 Gross per 24 hour  Intake 655.68 ml  Output 3950 ml  Net -3294.32 ml   Net IO Since Admission: -2,375.85 mL [01/20/24 1020]  Physical Exam: Physical Exam Constitutional:      General: She is not in acute distress.    Appearance: She is obese. She is not ill-appearing.     Comments: Tearful   Cardiovascular:     Rate and Rhythm: Normal rate. Rhythm irregular.     Pulses: Normal pulses.  Pulmonary:     Effort: Pulmonary effort is normal.     Breath sounds: Wheezing present.     Comments: Rare expiratory wheeze Abdominal:     General: Abdomen is flat.     Tenderness: There is no abdominal tenderness.   Musculoskeletal:        General: No tenderness.     Right lower leg: Edema present.     Left lower leg: Edema present.     Comments: Edema improved. 2+ R, 1+ L LE   Skin:     General: Skin is warm and dry.   Neurological:     General: No focal deficit present.     Mental Status: She is alert and oriented to person, place, and time.   Psychiatric:     Comments: Very anxious about family/social situation    Patient Lines/Drains/Airways Status     Active Line/Drains/Airways     Name Placement date Placement time Site Days   Peripheral IV 01/19/24 22 G Anterior;Right Forearm 01/19/24  0822  Forearm  1   Peripheral IV 01/19/24 20 G Posterior;Right Forearm 01/19/24  0945  Forearm  1            ASSESSMENT/PLAN:  Assessment: Principal Problem:   Atrial fibrillation with RVR (HCC) Active Problems:   Polysubstance abuse (HCC)   Tobacco dependence   COPD (chronic obstructive pulmonary disease) (HCC)   CAD (coronary artery disease) S/P stent to LAD   Acute on chronic systolic CHF (congestive heart failure) (HCC)   Left hip pain  Autumn [name] is a 68 y.o. woman with PMH of HFrEF, A-fib on Xarelto , attention deficit disorder, COPD, chronic hypoxic respiratory failure on 2 L [place], anxiety and depression, fibromyalgia, HLD, and CAD.    She presented  to [facility], ED 6/9 with complaint of acute on chronic left hip pain and was subsequently found to be in A-fib with RVR and volume overloaded.  Plan: Acute HFrEF with EF 25-30% Volume overload Severe Mitral Regurgitation Hx COPD, Chronic hypoxic respiratory failure on 2L O2 Cardiology consulting, thank you. On RA this morning, not hypoxic, no dyspnea. No incresase in Cr. Still some volume to remove. Continue diuresis per cards. - Lasix  120 IV BID (up from 80 BID yesteday) - GDMT with spiro 25, toprol  XL 25, Entresto  24/26, Farxiga  10 - Losartan  12.5 daily - Dixogin 0.125 daily - Strict ins and outs, daily weights.  Dry weight is unclear. Charted as neutral fluid balance and 1 pound increase since arrival to floor. - DuoNebs every 6 as needed - Recs per cardiology  Atrial Fibrillation with RVR s/p  Cardioversion 6/11 now NSR Cardiology consulting, thank you. Cardioverted yesterday. Now sinus rhythm with some PVCs and rates around 70. Stable. She remains on amiodarone  bolus/infusion. Suspect tachycardia is contributing to her cardiomyopathy - and med adherence out of hospital is poor - favor eventual AF ablation. - Continue home anticoagulation with Xarelto  20 daily - Continue toprol  XL 25 daily - Amiodarone  gtt, consider oral tomorrow - Recs per cardiology    GAD/MDD Anxiety waxes and wanes daily, but severe and tearful at times. Usually redirectable. Shares that she takes street Xanax  due to this. Feels that most treatment per doctors in past not helpful. Prescribed aripiprazole  in past but not filled since 2024. Questionable med adherence.  On re-eval was restful after hydroxyzine . - Start cymbalta  30 daily, consider increase to 60. Hope this will have added benefit to help sciatica. - Schedule gabapentin  600 TID. Reports this can cause tremors, will monitor. - Hydroxyzine  TID prn  L hip pain, sciatic pain Comes and goes but distractible. Did not complain of it today. - Avoid narcotic pain medicine - Tylenol  650 TID - Resume home gabapentin  dose of 600 TID, and starting Cymbalta  per above - Lidocaine  patch, heating pad prn  Substance use disorder Resolved Hypersomnolence from substance use Improving. Alert through interview today, appears tired, AOx3. Suspect this was a drug effect. - VBG has ruled out hypercarbia - Avoid sedating medications including narcotic pain medicine  - Supportive care - Recommend abstinence from substances - TOC for resources    OSA COPD on 2L at baseline not in exacerbation Diagnosed but does not ear CPAP. - rec outpatient pulm eval    CAD Atorvastatin  80 daily. No ASA as on Xarelto .  Medication noncompliance Poor adherence out of hospital.  She does not give a clear reason why.  She cannot confirm her medication list.  Control of  cardiovascular/pain/mood disease difficult due to this.   Cigarette smoker 1 ppd. Will give a nicotine  patch. She should quit.  Best Practice: Diet: Cardiac diet IVF: none VTE: DOAC Code: Full DISPO: Anticipated discharge in 1-5 days to Home pending HF decongesion, Afib therapy.  Signature: Carleen Chary, D.O.  Internal Medicine Resident, PGY-1 [facility]  Pager: # [phone]. 10:20 AM, 01/20/2024

## 2024-01-20 NOTE — Progress Notes (Addendum)
 Patient ID: Autumn [name], female   DOB: [birth year], 68 y.o.   MRN: [id number]     Advanced Heart Failure Rounding Note  Cardiologist: Jerryl Morin, DO  Chief Complaint: CHF  Subjective:    6/11: TEE-guided DCCV, she went back into NSR on 2nd shock. TEE showed LV EF 30-35%, moderate LV dilation, mild RV dysfunction, moderate TR, severe MR with restriction of the posterior leaflet and malcoaptation.   On Amio 30/hr. I/Os not complete. Weight down 1lb (overall up 1lb from admission)  Hysterical, tearful this morning. Anxious. Reports that she is not peeing a lot. Breathing feeling better.   Objective:    Weight Range: 93.8 kg Body mass index is 35.5 kg/m.   Vital Signs:   Temp:  [96.9 F (36.1 C)-97.7 F (36.5 C)] 97.6 F (36.4 C) (06/12 0400) Pulse Rate:  [16-115] 70 (06/12 0743) Resp:  [18-32] 18 (06/12 0743) BP: (84-124)/(52-107) 118/63 (06/12 0743) SpO2:  [92 %-100 %] 92 % (06/12 0400) Weight:  [93.8 kg] 93.8 kg (06/12 0318) Last BM Date : 01/17/24  Weight change: Filed Weights   01/18/24 0511 01/19/24 0534 01/20/24 0318  Weight: 93.3 kg 94.1 kg 93.8 kg   Intake/Output:  Intake/Output Summary (Last 24 hours) at 01/20/2024 0810 Last data filed at 01/20/2024 0600 Gross per 24 hour  Intake 1005.68 ml  Output 1750 ml  Net -744.32 ml    Physical Exam    General: Hysterical appearing. No distress on [place] Cardiac: JVP ~10. S1 and S2 present. No murmurs or rub. Extremities: Warm and dry.  1+ pitting edema.  Neuro: Alert and oriented x3. Moves all extremities without difficulty.  Telemetry   SR in 70s, low voltage (personally reviewed)  Labs    CBC Recent Labs    01/17/24 1034 01/17/24 1856 01/18/24 0421  WBC 7.6  --  7.8  HGB 12.0 12.2 12.9  HCT 36.5 36.0 39.7  MCV 91.0  --  89.4  PLT 316  --  357   Basic Metabolic Panel Recent Labs    47/82/95 0355 01/20/24 0442  NA 136 140  K 4.3 3.5  CL 99 100  CO2 26 30  GLUCOSE 115* 107*  BUN 17 16   CREATININE 0.91 0.93  CALCIUM  8.9 8.8*   Liver Function Tests Recent Labs    01/17/24 1034  AST 34  ALT 68*  ALKPHOS 60  BILITOT 1.0  PROT 6.0*  ALBUMIN 2.7*   BNP (last 3 results) Recent Labs    07/02/23 1153 08/06/23 1802 01/17/24 1034  BNP 411.5* 238.2* 826.3*   Imaging   ECHOCARDIOGRAM COMPLETE Result Date: 01/19/2024    ECHOCARDIOGRAM REPORT   Patient Name:   Autumn [name] Date of Exam: 01/19/2024 Medical Rec #:  [id number]       Height:       64.0 in Accession #:    [id number]      Weight:       207.5 lb Date of Birth:  1956-02-09       BSA:          1.987 m Patient Age:    67 years        BP:           87/52 mmHg Patient Gender: F               HR:           88 bpm. Exam Location:  Inpatient Procedure: 2D Echo, 3D Echo, Cardiac Doppler,  Color Doppler and Strain Analysis            (Both Spectral and Color Flow Doppler were utilized during            procedure). Indications:    I50.21 Acute systolic (congestive) heart failure  History:        Patient has prior history of Echocardiogram examinations, most                 recent 01/19/2024. CHF, CAD, COPD; Risk Factors:Dyslipidemia and                 Polysubstance Abuse.  Sonographer:    Sherline Distel Senior RDCS Referring Phys: [id number] Jolinda Necessary [facility]  Sonographer Comments: Echo post cardioversion IMPRESSIONS  1. Left ventricular ejection fraction, by estimation, is 30 to 35%. Left ventricular ejection fraction by 2D MOD biplane is 34.7 %. The left ventricle has moderately decreased function. The left ventricle demonstrates global hypokinesis. The left ventricular internal cavity size was severely dilated. There is mild asymmetric left ventricular hypertrophy of the infero-lateral segment. Left ventricular diastolic parameters are indeterminate. The average left ventricular global longitudinal strain is -11.3 %. The global longitudinal strain is abnormal.  2. Right ventricular systolic function is mildly reduced. The right ventricular size is  moderately enlarged. There is moderately elevated pulmonary artery systolic pressure.  3. Left atrial size was severely dilated.  4. Right atrial size was severely dilated.  5. The mitral valve is abnormal. Severe mitral valve regurgitation. The mean mitral valve gradient is 5.0 mmHg with average heart rate of 70 bpm. Moderate mitral annular calcification.  6. The tricuspid valve is abnormal. Tricuspid valve regurgitation is severe.  7. The aortic valve is tricuspid. There is mild calcification of the aortic valve. There is mild thickening of the aortic valve. Aortic valve regurgitation is mild. Aortic valve sclerosis is present, with no evidence of aortic valve stenosis.  8. The inferior vena cava is dilated in size with <50% respiratory variability, suggesting right atrial pressure of 15 mmHg.  9. Evidence of atrial level shunting detected by color flow Doppler. There is a small patent foramen ovale. Comparison(s): Prior images reviewed side by side. Conclusion(s)/Recommendation(s): See TEE from today as well; severe MR with restricted motion of posterior MV leaflet. Global hypokinesis with EF 30-35%. FINDINGS  Left Ventricle: Left ventricular ejection fraction, by estimation, is 30 to 35%. Left ventricular ejection fraction by 2D MOD biplane is 34.7 %. The left ventricle has moderately decreased function. The left ventricle demonstrates global hypokinesis. The average left ventricular global longitudinal strain is -11.3 %. Strain was performed and the global longitudinal strain is abnormal. The left ventricular internal cavity size was severely dilated. There is mild asymmetric left ventricular hypertrophy  of the infero-lateral segment. Left ventricular diastolic parameters are indeterminate. Right Ventricle: The right ventricular size is moderately enlarged. No increase in right ventricular wall thickness. Right ventricular systolic function is mildly reduced. There is moderately elevated pulmonary artery  systolic pressure. The tricuspid regurgitant velocity is 2.94 m/s, and with an assumed right atrial pressure of 15 mmHg, the estimated right ventricular systolic pressure is 49.6 mmHg. Left Atrium: Left atrial size was severely dilated. Right Atrium: Right atrial size was severely dilated. Pericardium: Trivial pericardial effusion is present. The pericardial effusion is localized near the right atrium. Mitral Valve: Severe restriction of motion of posterior mitral valve leaflet. The mitral valve is abnormal. Moderate mitral annular calcification. Severe mitral valve regurgitation, with centrally-directed jet. MV peak gradient,  15.5 mmHg. The mean mitral valve gradient is 5.0 mmHg with average heart rate of 70 bpm. Tricuspid Valve: The tricuspid valve is abnormal. Tricuspid valve regurgitation is severe. No evidence of tricuspid stenosis. The flow in the hepatic veins is reversed during ventricular systole. Aortic Valve: The aortic valve is tricuspid. There is mild calcification of the aortic valve. There is mild thickening of the aortic valve. There is moderate aortic valve annular calcification. Aortic valve regurgitation is mild. Aortic valve sclerosis is present, with no evidence of aortic valve stenosis. Pulmonic Valve: The pulmonic valve was not well visualized. Pulmonic valve regurgitation is trivial. No evidence of pulmonic stenosis. Aorta: The aortic root and ascending aorta are structurally normal, with no evidence of dilitation. Venous: The inferior vena cava is dilated in size with less than 50% respiratory variability, suggesting right atrial pressure of 15 mmHg. The inferior vena cava and the hepatic vein show a pattern of systolic flow reversal, suggestive of tricuspid regurgitation. IAS/Shunts: Evidence of atrial level shunting detected by color flow Doppler. A small patent foramen ovale is detected. Additional Comments: 3D was performed not requiring image post processing on an independent  workstation and was abnormal.  LEFT VENTRICLE PLAX 2D                        Biplane EF (MOD) LVIDd:         6.90 cm         LV Biplane EF:   Left LVIDs:         6.00 cm                          ventricular LV PW:         1.20 cm                          ejection LV IVS:        0.90 cm                          fraction by LVOT diam:     2.20 cm                          2D MOD LV SV:         52                               biplane is LV SV Index:   26                               34.7 %. LVOT Area:     3.80 cm                                 2D Longitudinal LV Volumes (MOD)               Strain LV vol d, MOD    263.0 ml      2D Strain GLS   -10.6 % A2C:                           (A4C): LV vol d,  MOD    187.0 ml      2D Strain GLS   -12.4 % A4C:                           (A3C): LV vol s, MOD    166.0 ml      2D Strain GLS   -10.8 % A2C:                           (A2C): LV vol s, MOD    127.0 ml      2D Strain GLS   -11.3 % A4C:                           Avg: LV SV MOD A2C:   97.0 ml LV SV MOD A4C:   187.0 ml LV SV MOD BP:    78.0 ml                                3D Volume EF:                                3D EF:        37 %                                LV EDV:       240 ml                                LV ESV:       151 ml                                LV SV:        89 ml RIGHT VENTRICLE RV S prime:     7.94 cm/s TAPSE (M-mode): 1.9 cm LEFT ATRIUM              Index        RIGHT ATRIUM           Index LA diam:        5.80 cm  2.92 cm/m   RA Area:     32.10 cm LA Vol (A2C):   152.0 ml 76.50 ml/m  RA Volume:   122.00 ml 61.40 ml/m LA Vol (A4C):   128.0 ml 64.42 ml/m LA Biplane Vol: 149.0 ml 74.99 ml/m  AORTIC VALVE LVOT Vmax:   72.00 cm/s LVOT Vmean:  52.200 cm/s LVOT VTI:    0.137 m  AORTA Ao Root diam: 2.90 cm Ao Asc diam:  3.30 cm MITRAL VALVE                TRICUSPID VALVE MV Area (PHT): 3.13 cm     TR Peak grad:   34.6 mmHg MV Area VTI:   1.11 cm     TR Vmax:        294.00 cm/s MV Peak grad:  15.5  mmHg MV Mean grad:  5.0 mmHg     SHUNTS MV Vmax:       1.97 m/s  Systemic VTI:  0.14 m MV Vmean:      101.0 cm/s   Systemic Diam: 2.20 cm MV Decel Time: 242 msec MV E velocity: 164.00 cm/s Sheryle Donning MD Electronically signed by Sheryle Donning MD Signature Date/Time: 01/19/2024/2:13:31 PM    Final    EP STUDY Result Date: 01/19/2024 See surgical note for result.  Medications:    Scheduled Medications:  acetaminophen   650 mg Oral QID   atorvastatin   80 mg Oral Daily   digoxin   0.125 mg Oral Daily   furosemide   80 mg Intravenous BID   lidocaine   1 patch Transdermal Q24H   metoprolol  succinate  25 mg Oral Daily   nicotine   21 mg Transdermal Daily   rivaroxaban   20 mg Oral Q supper   sacubitril -valsartan   1 tablet Oral BID   spironolactone   25 mg Oral Daily   Infusions:  amiodarone  30 mg/hr (01/20/24 0600)   PRN Medications: gabapentin , hydrOXYzine , ipratropium-albuterol , mouth rinse, polyethylene glycol  Assessment/Plan   1. Acute on chronic systolic CHF: Echo in 6/22 with EF 60-65%.  Had multiple echoes in 2024, each with lower EF.  Echo in 10/24 with EF 30-35%, moderate-severe MR.  She has a history of CAD but denies recent chest pain.  Cath 12/24 showed significant CAD but no critical lesion.  TEE 12/24 with EF 25-30%, moderate RV dysfunction, moderate MR. 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  TEE yesterday showed EF 30-35%, moderate LV dilation, mild RV dysfunction, moderate TR, severe mitral regurgitation with restriction of the posterior leaflet and malcoaptation. She required milrinone  with diuresis in 12/24.  She is readmitted in AF/RVR (last ECG in 1/25 also showed AF) off all her medications with significant volume overload on exam.  Creatinine stable at 0.91. She remains volume overloaded.  - Need I/Os recorded on floor.  - UOP marginal with diuresis. Suspect not meeting diuretic threshold. Increase  BID Lasix  to 120 mg bid.  - Continue spironolactone  25 daily - Continue digoxin  0.125 daily - Continue lower dose of Toprol  XL (25 mg daily).  - Continue Entresto  24/26 bid today.   - Start Farixiga 10 mg daily - She is not a candidate for advanced therapies with active substance abuse and noncompliance.  2. Atrial fibrillation: Persistent, multiple admissions with AF/RVR.  Cardioverted to NSR in 12/24 but stopped amiodarone  and Xarelto  when she went home and went back into AF.  She was in AF in 1/25, no ECGs after that until this admission when she was in AF with RVR.  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  She does not tolerate AF well.  She had TEE-guided DCCV 6/11 and is back in NSR.  - Continue amiodarone  gtt today, possibly to po tomorrow. - Continue low dose Toprol  XL 25 daily and digoxin  0.125 daily to help with rate control.   - Continue Xarelto   - Would favor eventual AF ablation.  3. 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 Moderate on TEE 12/24.  Severe mitral regurgitation on TEE 6/11 with restricted posterior leaflet and malcoaptation.  - Repeat echo in NSR with severe MR with restricted MV leaflet, may end up benefiting from mTEER.   4. CAD: DES to LAD in 9/20.  No recent chest pain. LHC 12/24 showed patent LAD stent, 75% mid LCx stenosis and heavily calcified 60% proximal RCA stenosis. Managed medically given no critical lesion and no chest pain. Troponin minimally elevated with no trend.  -  Continue atorvastatin .  - No ASA with Xarelto  use.  5. COPD: She smokes and uses home oxygen  at times.  - Needs to quit smoking.  6. Sciatica: Significant chronic pain.  Per [facility].  7. OSA: Suspected.  - Will need sleep study.  8. PFO: Small, noted on TEE.  9. Substance abuse: Cocaine in past though UDS did not show cocaine this admission.  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  Self-medicating for depression and  back pain.  - Prev treated with Suboxone . Start Zoloft today.   Length of Stay: 2  [place] Lee, NP  01/20/2024, 8:10 AM  Advanced Heart Failure Team Pager [phone] (M-F; 7a - 5p)  Please contact [facility] for night-coverage after hours (5p -7a ) and weekends on [email or web address]   Patient seen with NP, I formulated the plan and agree with the above note.   She is tearful and anxious this morning.  Says she has not diuresed much.  I/Os seem incomplete.  Creatinine stable at 0.93.   TEE-guided DCCV yesterday, she remains in NSR with PACs today.   General: NAD Neck: JVP 14-16 cm, no thyromegaly or thyroid nodule.  Lungs: Clear to auscultation bilaterally with normal respiratory effort. CV: Nondisplaced PMI.  Heart regular S1/S2, no S3/S4, no murmur.  Trace ankle edema.  Abdomen: Soft, nontender, no hepatosplenomegaly, no distention.  Skin: Intact without lesions or rashes.  Neurologic: Alert and oriented x 3.  Psych: Anxious/tearful. Extremities: No clubbing or cyanosis.  HEENT: Normal.   Patient remains in NSR on Xarelto  and amiodarone  gtt.  Will leave on amiodarone  gtt today with aggressive diuresis and PACs noted.   She is still volume overloaded.  I/Os do not seem complete.  She remains dyspneic. Creatinine stable 0.93.  - Lasix  80 mg IV x 1 given this morning.  Will give metolazone 2.5 x 1 and start Lasix  gtt 15 mg/hr.  Replace K.  - Start Farxiga  10 mg daily.  - If she diureses poorly today, will place PICC to follow CVP and co-ox.   Peder Bourdon 01/20/2024 11:28 AM

## 2024-01-20 NOTE — Progress Notes (Signed)
 Dr. Teofilo Fellers and Dr. Isabell Manzanilla aware of amiodarone  drip placed on hold by day nurse. Will continue current plan of care.

## 2024-01-21 ENCOUNTER — Inpatient Hospital Stay (HOSPITAL_COMMUNITY)

## 2024-01-21 ENCOUNTER — Other Ambulatory Visit (HOSPITAL_COMMUNITY): Payer: Self-pay

## 2024-01-21 ENCOUNTER — Telehealth (HOSPITAL_COMMUNITY): Payer: Self-pay | Admitting: Pharmacy Technician

## 2024-01-21 DIAGNOSIS — M25552 Pain in left hip: Secondary | ICD-10-CM | POA: Diagnosis not present

## 2024-01-21 DIAGNOSIS — I5023 Acute on chronic systolic (congestive) heart failure: Secondary | ICD-10-CM | POA: Diagnosis not present

## 2024-01-21 DIAGNOSIS — I4819 Other persistent atrial fibrillation: Secondary | ICD-10-CM | POA: Diagnosis not present

## 2024-01-21 DIAGNOSIS — I4891 Unspecified atrial fibrillation: Secondary | ICD-10-CM | POA: Diagnosis not present

## 2024-01-21 LAB — CBC
HCT: 44 % (ref 36.0–46.0)
Hemoglobin: 14.3 g/dL (ref 12.0–15.0)
MCH: 29.1 pg (ref 26.0–34.0)
MCHC: 32.5 g/dL (ref 30.0–36.0)
MCV: 89.6 fL (ref 80.0–100.0)
Platelets: 352 10*3/uL (ref 150–400)
RBC: 4.91 MIL/uL (ref 3.87–5.11)
RDW: 14.6 % (ref 11.5–15.5)
WBC: 9.9 10*3/uL (ref 4.0–10.5)
nRBC: 0 % (ref 0.0–0.2)

## 2024-01-21 LAB — ECHOCARDIOGRAM COMPLETE
Area-P 1/2: 4.26 cm2
Calc EF: 40.9 %
Height: 64 in
S' Lateral: 4.7 cm
Single Plane A2C EF: 34.9 %
Single Plane A4C EF: 49.2 %
Weight: 3254.4 [oz_av]

## 2024-01-21 LAB — RENAL FUNCTION PANEL
Albumin: 2.6 g/dL — ABNORMAL LOW (ref 3.5–5.0)
Anion gap: 12 (ref 5–15)
BUN: 24 mg/dL — ABNORMAL HIGH (ref 8–23)
CO2: 31 mmol/L (ref 22–32)
Calcium: 9.5 mg/dL (ref 8.9–10.3)
Chloride: 90 mmol/L — ABNORMAL LOW (ref 98–111)
Creatinine, Ser: 0.97 mg/dL (ref 0.44–1.00)
GFR, Estimated: >60 mL/min (ref >60)
Glucose, Bld: 122 mg/dL — ABNORMAL HIGH (ref 70–99)
Phosphorus: 5.2 mg/dL — ABNORMAL HIGH (ref 2.5–4.6)
Potassium: 4.4 mmol/L (ref 3.5–5.1)
Sodium: 133 mmol/L — ABNORMAL LOW (ref 135–145)

## 2024-01-21 LAB — BASIC METABOLIC PANEL WITH GFR
Anion gap: 11 (ref 5–15)
BUN: 15 mg/dL (ref 8–23)
CO2: 34 mmol/L — ABNORMAL HIGH (ref 22–32)
Calcium: 9.1 mg/dL (ref 8.9–10.3)
Chloride: 92 mmol/L — ABNORMAL LOW (ref 98–111)
Creatinine, Ser: 0.9 mg/dL (ref 0.44–1.00)
GFR, Estimated: >60 mL/min (ref >60)
Glucose, Bld: 127 mg/dL — ABNORMAL HIGH (ref 70–99)
Potassium: 4 mmol/L (ref 3.5–5.1)
Sodium: 137 mmol/L (ref 135–145)

## 2024-01-21 LAB — MAGNESIUM: Magnesium: 1.9 mg/dL (ref 1.7–2.4)

## 2024-01-21 MED ORDER — MUSCLE RUB 10-15 % EX CREA
TOPICAL_CREAM | CUTANEOUS | Status: DC | PRN
  Filled 2024-01-21: qty 85

## 2024-01-21 MED ORDER — AMIODARONE HCL 200 MG PO TABS
200.0000 mg | ORAL_TABLET | Freq: Two times a day (BID) | ORAL | Status: DC
  Administered 2024-01-21 – 2024-01-23 (×6): 200 mg via ORAL
  Filled 2024-01-21 (×6): qty 1

## 2024-01-21 MED ORDER — METHOCARBAMOL 500 MG PO TABS
500.0000 mg | ORAL_TABLET | Freq: Once | ORAL | Status: AC
  Administered 2024-01-21: 500 mg via ORAL
  Filled 2024-01-21: qty 1

## 2024-01-21 MED ORDER — DULOXETINE HCL 60 MG PO CPEP
60.0000 mg | ORAL_CAPSULE | Freq: Every day | ORAL | Status: DC
  Administered 2024-01-22 – 2024-01-29 (×8): 60 mg via ORAL
  Filled 2024-01-21 (×8): qty 1

## 2024-01-21 MED ORDER — POTASSIUM CHLORIDE CRYS ER 20 MEQ PO TBCR
30.0000 meq | EXTENDED_RELEASE_TABLET | Freq: Once | ORAL | Status: AC
  Administered 2024-01-21: 30 meq via ORAL
  Filled 2024-01-21: qty 1

## 2024-01-21 MED ORDER — MAGNESIUM SULFATE 2 GM/50ML IV SOLN
2.0000 g | Freq: Once | INTRAVENOUS | Status: AC
  Administered 2024-01-21: 2 g via INTRAVENOUS
  Filled 2024-01-21: qty 50

## 2024-01-21 MED ORDER — MAGNESIUM SULFATE 2 GM/50ML IV SOLN: 2.0000 g | Freq: Once | INTRAVENOUS | Status: DC

## 2024-01-21 MED ORDER — TRAMADOL HCL 50 MG PO TABS
50.0000 mg | ORAL_TABLET | Freq: Four times a day (QID) | ORAL | Status: DC | PRN
  Administered 2024-01-21 – 2024-01-29 (×7): 50 mg via ORAL
  Filled 2024-01-21 (×7): qty 1

## 2024-01-21 MED ORDER — TORSEMIDE 20 MG PO TABS
60.0000 mg | ORAL_TABLET | Freq: Two times a day (BID) | ORAL | Status: DC
  Administered 2024-01-21 – 2024-01-22 (×2): 60 mg via ORAL
  Filled 2024-01-21 (×3): qty 3

## 2024-01-21 NOTE — Progress Notes (Addendum)
 Patient ID: Autumn [name], female   DOB: 1955-08-31, 68 y.o.   MRN: [id number]     Advanced Heart Failure Rounding Note  Cardiologist: Jerryl Morin, DO  Chief Complaint: CHF  Subjective:    6/11: TEE-guided DCCV, she went back into NSR on 2nd shock. TEE showed LV EF 30-35%, moderate LV dilation, mild RV dysfunction, moderate TR, severe MR with restriction of the posterior leaflet and malcoaptation.   Amio held overnight per Cardiology for HR of 57 and BP 92/65.  9.1L UOP (net -8.3L). Weight down 17lbs. Cr stable.  Sitting up on EOB. Reports breathing is much better. No dizziness or SOB. Remains with back pain.   Objective:    Weight Range: 92.3 kg Body mass index is 34.91 kg/m.   Vital Signs:   Temp:  [97.6 F (36.4 C)-98.1 F (36.7 C)] 97.6 F (36.4 C) (06/13 0807) Pulse Rate:  [57-72] 72 (06/13 0807) Resp:  [16-20] 18 (06/13 0807) BP: (90-122)/(62-82) 105/76 (06/13 0807) SpO2:  [91 %-96 %] 95 % (06/13 0807) Weight:  [85.2 kg-92.3 kg] 92.3 kg (06/13 0706) Last BM Date : 01/20/24  Weight change: Filed Weights   01/20/24 0318 01/21/24 0500 01/21/24 0706  Weight: 93.8 kg 85.2 kg 92.3 kg   Intake/Output:  Intake/Output Summary (Last 24 hours) at 01/21/2024 0826 Last data filed at 01/21/2024 0700 Gross per 24 hour  Intake 758.7 ml  Output 9100 ml  Net -8341.3 ml    Physical Exam    General: Frail appearing. No distress on [place] Cardiac: JVP difficult to assess. S1 and S2 present. No murmurs or rub. Resp: Lung sounds coarse Extremities: Warm and dry.  No peripheral  edema.  Neuro: Alert and oriented x3. Affect pleasant. Moves all extremities with weakness.   Telemetry   SR in 60-70s with PACs. Overnight occasionally dipping into 50s (personally reviewed)  Labs    CBC Recent Labs    01/20/24 0847 01/21/24 0456  WBC 10.6* 9.9  HGB 14.1 14.3  HCT 44.9 44.0  MCV 92.6 89.6  PLT 337 352   Basic Metabolic Panel Recent Labs    40/98/11 0442 01/20/24 0847  01/21/24 0456  NA 140  --  137  K 3.5  --  4.0  CL 100  --  92*  CO2 30  --  34*  GLUCOSE 107*  --  127*  BUN 16  --  15  CREATININE 0.93  --  0.90  CALCIUM  8.8*  --  9.1  MG  --  1.6* 1.9   BNP (last 3 results) Recent Labs    07/02/23 1153 08/06/23 1802 01/17/24 1034  BNP 411.5* 238.2* 826.3*   Imaging   No results found.  Medications:    Scheduled Medications:  acetaminophen   650 mg Oral QID   atorvastatin   80 mg Oral Daily   dapagliflozin  propanediol  10 mg Oral Daily   digoxin   0.125 mg Oral Daily   DULoxetine   30 mg Oral Daily   gabapentin   600 mg Oral TID   lidocaine   1 patch Transdermal Q24H   metoprolol  succinate  25 mg Oral Daily   nicotine   21 mg Transdermal Daily   rivaroxaban   20 mg Oral Q supper   sacubitril -valsartan   1 tablet Oral BID   spironolactone   25 mg Oral Daily   Infusions:  amiodarone  Stopped (01/20/24 1951)   furosemide  (LASIX ) 200 mg in dextrose  5 % 100 mL (2 mg/mL) infusion 15 mg/hr (01/21/24 0341)   magnesium  sulfate  bolus IVPB     PRN Medications: hydrOXYzine , ipratropium-albuterol , mouth rinse, polyethylene glycol  Assessment/Plan   1. Acute on chronic systolic CHF: Echo in 6/22 with EF 60-65%.  Had multiple echoes in 2024, each with lower EF.  Echo in 10/24 with EF 30-35%, moderate-severe MR.  She has a history of CAD but denies recent chest pain.  Cath 12/24 showed significant CAD but no critical lesion.  TEE 12/24 with EF 25-30%, moderate RV dysfunction, moderate MR. 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  TEE yesterday showed EF 30-35%, moderate LV dilation, mild RV dysfunction, moderate TR, severe mitral regurgitation with restriction of the posterior leaflet and malcoaptation. She required milrinone  with diuresis in 12/24.  She is readmitted in AF/RVR (last ECG in 1/25 also showed AF) off all her medications with significant volume overload on exam. Cr stable 0.9.  -  Diuresed significantly with Lasix  gtt, 9L. Volume exam difficult. Hold Lasix  gtt for now, obtain ReDs reading this morning. - Continue spironolactone  25 daily - Continue digoxin  0.125 daily - Continue Toprol  XL 25 mg daily.  - Continue Entresto  24/26 bid today.   - Continue Farixiga 10 mg daily - She is not a candidate for advanced therapies with active substance abuse and noncompliance.  2. Atrial fibrillation: Persistent, multiple admissions with AF/RVR.  Cardioverted to NSR in 12/24 but stopped amiodarone  and Xarelto  when she went home and went back into AF.  She was in AF in 1/25, no ECGs after that until this admission when she was in AF with RVR.  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  She does not tolerate AF well. She had TEE-guided DCCV 6/11 and is back in NSR.  - Amio gtt held overnight per Cardiology. Start PO amio 200 mg bid.  - Continue low dose Toprol  XL 25 daily and digoxin  0.125 daily. - Continue Xarelto   - Would favor eventual AF ablation.  3. 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 Moderate on TEE 12/24.  Severe mitral regurgitation on TEE 6/11 with restricted posterior leaflet and malcoaptation.  - Repeat echo today pending ReDs reading, now that in NSR. May end up benefiting from mTEER.   4. CAD: DES to LAD in 9/20.  No recent chest pain. LHC 12/24 showed patent LAD stent, 75% mid LCx stenosis and heavily calcified 60% proximal RCA stenosis. Managed medically given no critical lesion and no chest pain. Troponin minimally elevated with no trend.  - Continue atorvastatin .  - No ASA with Xarelto  use.  5. COPD: She smokes and uses home oxygen  at times.  - Needs to quit smoking.  6. Sciatica: Significant chronic pain.  Per [facility].  7. OSA: Suspected.  - Will need sleep study.  8. PFO: Small, noted on TEE.  9. Substance abuse: Cocaine in past though UDS did not show cocaine this admission. 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  Self-medicating for depression and back pain.  - Prev treated with Suboxone . Cymbalta  per primary.    Length of Stay: 3  [place] Lee, NP  01/21/2024, 8:26 AM  Advanced Heart Failure Team Pager [phone] (M-F; 7a - 5p)  Please contact [facility] for night-coverage after hours (5p -7a ) and weekends on [email or web address]   Patient seen with NP, I formulated the plan and agree with the above note.   REDS clip 35% this morning, excellent diuresis yesterday with weight down.  SBP 100s.  She remains in NSR with PACs.   General: NAD Neck: JVP difficult, no thyromegaly or thyroid nodule.  Lungs: Clear to auscultation bilaterally with normal respiratory effort. CV: Nondisplaced PMI.  Heart regular S1/S2, no S3/S4, no murmur.  Trace ankle edema.  Abdomen: Soft, nontender, no hepatosplenomegaly, no distention.  Skin: Intact without lesions or rashes.  Neurologic: Alert and oriented x 3.  Psych: Normal affect. Extremities: No clubbing or cyanosis.  HEENT: Normal.   We will switch her to po diuretics today, will use torsemide  60 mg bid.  She was supposed to be on this dose at home but had not been taking it.  Creatinine stable at 0.9.  Will continue other GDMT without change.   She remains in NSR, transition to po amiodarone  and continue Xarelto .   She is doing much better from cardiac perspective but still has significant chronic pain that is limiting her ambulation.   When she is ready for home, will have followup in CHF clinic.   Peder Bourdon 01/21/2024 9:44 AM

## 2024-01-21 NOTE — Progress Notes (Addendum)
 HD#3 SUBJECTIVE:  Patient Summary: Autumn [name] is a 68 y.o. with a pertinent PMH of HFrEF, A-fib on Xarelto , attention deficit disorder, COPD, chronic hypoxic respiratory failure on 2 L [place], anxiety and depression, fibromyalgia, HLD, and CAD , who presented with acute on chronic left hip pain and admitted for A-fib with RVR and HFrEF exacerbation.   Overnight Events and Interim History: Overnight bradycardia and soft blood pressures (not overtly hypotensive) so stopped amiodarone  infusion. Pt asymptomatic. She is restful this morning. Notes her hip/leg pain interferes with walking. Does not have CP, dyspnea, palpitations.  Significant Hospital Events:  DCCV/TEE on 6/11  OBJECTIVE:  Vital Signs: Vitals:   01/21/24 0500 01/21/24 0706 01/21/24 0750 01/21/24 0807  BP:   113/74 105/76  Pulse:    72  Resp:    18  Temp:    97.6 F (36.4 C)  TempSrc:    Oral  SpO2:    95%  Weight: 85.2 kg 92.3 kg    Height:       Supplemental O2: Nasal Cannula SpO2: 95 % O2 Flow Rate (L/min): 4 L/min  Filed Weights   01/20/24 0318 01/21/24 0500 01/21/24 0706  Weight: 93.8 kg 85.2 kg 92.3 kg     Intake/Output Summary (Last 24 hours) at 01/21/2024 0857 Last data filed at 01/21/2024 0846 Gross per 24 hour  Intake 758.7 ml  Output 9800 ml  Net -9041.3 ml   Net IO Since Admission: -9,217.15 mL [01/21/24 0857]  Physical Exam: Physical Exam Constitutional:      General: She is not in acute distress.    Appearance: She is obese. She is not ill-appearing.     Comments: Restful.   Cardiovascular:     Rate and Rhythm: Normal rate and regular rhythm.     Pulses: Normal pulses.     Comments: Some PACs on tele. Sinus rhythm Pulmonary:     Effort: Pulmonary effort is normal.     Breath sounds: Normal breath sounds. No rales.  Abdominal:     General: Abdomen is flat.     Tenderness: There is no abdominal tenderness.   Musculoskeletal:     Right lower leg: Edema present.     Left lower leg:  Edema present.     Comments: 1+ bilateral LE to mid shin   Skin:    General: Skin is warm and dry.   Neurological:     General: No focal deficit present.     Mental Status: She is alert. Mental status is at baseline.   Psychiatric:        Mood and Affect: Mood normal.        Behavior: Behavior normal.    Patient Lines/Drains/Airways Status     Active Line/Drains/Airways     Name Placement date Placement time Site Days   Peripheral IV 01/19/24 22 G Anterior;Right Forearm 01/19/24  0822  Forearm  2   Peripheral IV 01/19/24 20 G Posterior;Right Forearm 01/19/24  0945  Forearm  2   Peripheral IV 01/20/24 22 G Right Hand 01/20/24  1200  Hand  1            ASSESSMENT/PLAN:  Assessment: Principal Problem:   Atrial fibrillation with RVR (HCC) Active Problems:   Polysubstance abuse (HCC)   Tobacco dependence   COPD (chronic obstructive pulmonary disease) (HCC)   CAD (coronary artery disease) S/P stent to LAD   Acute on chronic systolic CHF (congestive heart failure) (HCC)   Left hip pain  Anxiety state  Autumn [name] is a 68 y.o. woman with PMH of HFrEF, A-fib on Xarelto , attention deficit disorder, COPD, chronic hypoxic respiratory failure on 2 L [place], anxiety and depression, fibromyalgia, HLD, and CAD.   She presented to [facility], ED 6/9 with complaint of acute on chronic left hip pain and was subsequently found to be in A-fib with RVR and volume overloaded.  Plan: Acute HFrEF with EF 25-30% Volume overload Severe Mitral Regurgitation Hx COPD, Chronic hypoxic respiratory failure on 2L O2 Cardiology consulting, thank you. On 4L [place] but tolerated reduction to 2L, not hypoxic, no dyspnea. No increase in Cr after lasix  gtt. Still mild LE edema.. - Torsemide  60 mg bid for diuresis after 9L output yesterday. This is her PTH and likely discharge regimen. - GDMT with spiro 25, toprol  XL 25, Entresto  24/26, Farxiga  10 - Losartan  12.5 daily - Dixogin 0.125 daily - Strict ins  and outs, daily weights. - Repeat ECHO today to consider mTEER - DuoNebs every 6 as needed - Recs per cardiology  Atrial Fibrillation with RVR s/p Cardioversion 6/11 now NSR Cardiology consulting, thank you. Remains sinus rhythm with some PACs/PVCs and rates around 70. Stable. Amiodarone  infusion stopped yesterday evening with asymptomatic bradycardia in the 50s. Overall, suspect tachyarrhythmia drives her cardiomyopathy - and med adherence out of hospital is poor - favor eventual AF ablation. - Continue home anticoagulation with Xarelto  20 daily - Continue toprol  XL 25 daily - Amiodarone  gtt -> po today, 200 mg BID - Recs per cardiology  GAD/MDD Anxiety waxes and wanes daily, but severe and tearful at times. Usually redirectable. Shares that she takes unprescribed Xanax  due to this. Feels that most treatment per doctors in past not helpful. Prescribed aripiprazole  in past but not filled since 2024. Questionable med adherence.   - Cont cymbalta  30 daily, consider increase to 60. Hope this will have added benefit to help sciatica. - Cont gabapentin  600 TID. Reports this can cause tremors, will monitor. - Hydroxyzine  TID prn  L hip pain, sciatic pain Comes and goes.  - Avoid narcotic pain medicine - Tylenol  650 TID - Cont gabapentin  and Cymbalta  per above - Lidocaine  patch, heating pad prn - Working with PT/OT - rec HHPT  Substance use disorder Resolved Hypersomnolence from substance use Alert through interview today, appears tired, AOx3. Suspect admission hypersomnolence was a drug effect. On suboxone  in past. - VBG ruled out hypercarbia - Avoid sedating medications including narcotic pain medicine  - Supportive care - Recommend abstinence from substances - TOC for resources   OSA COPD on 2L at baseline not in exacerbation Diagnosed but does not ear CPAP. - rec outpatient pulm eval  CAD Atorvastatin  80 daily. No ASA as on Xarelto .  Medication noncompliance Poor adherence  out of hospital.  She does not give a clear reason why.  She cannot confirm her medication list.  Control of cardiovascular/pain/mood disease difficult due to this.  Cigarette smoker 1 ppd. Will give a nicotine  patch. She should quit.  Best Practice: Diet: Cardiac diet IVF: none VTE: DOAC Code: Full Therapy Recs: HHPT DISPO: Anticipated discharge in 1-5 days to Home pending HF decongesion, Afib therapy.  Signature: Carleen Chary, D.O.  Internal Medicine Resident, PGY-1 [facility]  Pager: # [phone]. 8:57 AM, 01/21/2024

## 2024-01-21 NOTE — Telephone Encounter (Signed)
 Patient Product/process development scientist completed.    The patient is insured through [facility]. Patient has Medicare and is not eligible for a copay card, but may be able to apply for patient assistance or Medicare RX Payment Plan (Patient Must reach out to their plan, if eligible for payment plan), if available.    Ran test claim for Entresto  24-26 mg and the current 30 day co-pay is $0.00.  Ran test claim for Farxiga  10 mg and the current 30 day co-pay is $0.00.  Ran test claim for digoxin  0.125 mg and the current 30 day co-pay is $0.00.  Ran test claim for spironolactone  25 mg and 90 day supply was filled on 01/05/2024  Ran test claim for metoprolol  succinate 25 mg and 90 day supply was filled on 01/05/2024  This test claim was processed through [facility]- copay amounts may vary at other pharmacies due to pharmacy/plan contracts, or as the patient moves through the different stages of their insurance plan.     Morgan Arab, CPHT Pharmacy Technician III Certified Patient Advocate [facility] Patient Advocate Team Direct Number: [phone]  Fax: [phone]

## 2024-01-21 NOTE — Progress Notes (Signed)
 REDS Clip  READING (normal 20-35%) = 35   CHEST RULER (in) =28 Clip Station =  B    Reds reading performed x 2, both readings 35 %. Patient tolerated well. [place] Lee, NP was notified by secure chat of the readings.    Randie Bustle, BSN, Scientist, clinical (histocompatibility and immunogenetics) Only

## 2024-01-22 DIAGNOSIS — F17210 Nicotine dependence, cigarettes, uncomplicated: Secondary | ICD-10-CM

## 2024-01-22 DIAGNOSIS — I5021 Acute systolic (congestive) heart failure: Secondary | ICD-10-CM

## 2024-01-22 DIAGNOSIS — I5023 Acute on chronic systolic (congestive) heart failure: Secondary | ICD-10-CM | POA: Diagnosis not present

## 2024-01-22 DIAGNOSIS — F411 Generalized anxiety disorder: Secondary | ICD-10-CM | POA: Diagnosis not present

## 2024-01-22 DIAGNOSIS — I4819 Other persistent atrial fibrillation: Secondary | ICD-10-CM | POA: Diagnosis not present

## 2024-01-22 DIAGNOSIS — J449 Chronic obstructive pulmonary disease, unspecified: Secondary | ICD-10-CM

## 2024-01-22 DIAGNOSIS — M5430 Sciatica, unspecified side: Secondary | ICD-10-CM

## 2024-01-22 DIAGNOSIS — G4733 Obstructive sleep apnea (adult) (pediatric): Secondary | ICD-10-CM

## 2024-01-22 DIAGNOSIS — Z7901 Long term (current) use of anticoagulants: Secondary | ICD-10-CM

## 2024-01-22 DIAGNOSIS — I4891 Unspecified atrial fibrillation: Secondary | ICD-10-CM | POA: Diagnosis not present

## 2024-01-22 DIAGNOSIS — F1990 Other psychoactive substance use, unspecified, uncomplicated: Secondary | ICD-10-CM

## 2024-01-22 DIAGNOSIS — I340 Nonrheumatic mitral (valve) insufficiency: Secondary | ICD-10-CM | POA: Diagnosis not present

## 2024-01-22 DIAGNOSIS — F329 Major depressive disorder, single episode, unspecified: Secondary | ICD-10-CM

## 2024-01-22 DIAGNOSIS — I2510 Atherosclerotic heart disease of native coronary artery without angina pectoris: Secondary | ICD-10-CM | POA: Diagnosis not present

## 2024-01-22 LAB — BASIC METABOLIC PANEL WITH GFR
Anion gap: 16 — ABNORMAL HIGH (ref 5–15)
BUN: 29 mg/dL — ABNORMAL HIGH (ref 8–23)
CO2: 31 mmol/L (ref 22–32)
Calcium: 9.9 mg/dL (ref 8.9–10.3)
Chloride: 88 mmol/L — ABNORMAL LOW (ref 98–111)
Creatinine, Ser: 1.21 mg/dL — ABNORMAL HIGH (ref 0.44–1.00)
GFR, Estimated: 49 mL/min — ABNORMAL LOW (ref >60)
Glucose, Bld: 158 mg/dL — ABNORMAL HIGH (ref 70–99)
Potassium: 4.6 mmol/L (ref 3.5–5.1)
Sodium: 135 mmol/L (ref 135–145)

## 2024-01-22 LAB — MAGNESIUM: Magnesium: 2.2 mg/dL (ref 1.7–2.4)

## 2024-01-22 MED ORDER — MUSCLE RUB 10-15 % EX CREA
TOPICAL_CREAM | Freq: Every day | CUTANEOUS | Status: DC
  Filled 2024-01-22: qty 85

## 2024-01-22 MED ORDER — METHOCARBAMOL 500 MG PO TABS
750.0000 mg | ORAL_TABLET | Freq: Four times a day (QID) | ORAL | Status: DC | PRN
  Administered 2024-01-22 – 2024-01-26 (×3): 750 mg via ORAL
  Filled 2024-01-22 (×3): qty 2

## 2024-01-22 NOTE — Progress Notes (Signed)
 Patient ID: Autumn [name], female   DOB: 27-Feb-1956, 68 y.o.   MRN: [id number]     Cardiology Rounding Note  Cardiologist: Jerryl Morin, DO  Chief Complaint: CHF  Subjective:    6/11: TEE-guided DCCV, she went back into NSR on 2nd shock. TEE showed LV EF 30-35%, moderate LV dilation, mild RV dysfunction, moderate TR, severe MR with restriction of the posterior leaflet and malcoaptation.   Net -2.3 L yesterday, -10.9 L on admission.  Mild bump in creatinine (0.97 > 1.21).  BP 112/77.  Reports dyspnea improved  Objective:    Weight Range: 83.2 kg Body mass index is 31.5 kg/m.   Vital Signs:   Temp:  [97.6 F (36.4 C)-98.5 F (36.9 C)] 98.2 F (36.8 C) (06/14 0903) Pulse Rate:  [60-69] 67 (06/14 0520) Resp:  [19-21] 20 (06/14 0903) BP: (91-112)/(56-77) 112/77 (06/14 0903) SpO2:  [90 %-96 %] 95 % (06/14 0520) Weight:  [83.2 kg] 83.2 kg (06/14 0635) Last BM Date : 01/20/24  Weight change: Filed Weights   01/21/24 0500 01/21/24 0706 01/22/24 0635  Weight: 85.2 kg 92.3 kg 83.2 kg   Intake/Output:  Intake/Output Summary (Last 24 hours) at 01/22/2024 1151 Last data filed at 01/22/2024 0800 Gross per 24 hour  Intake 720 ml  Output 2825 ml  Net -2105 ml    Physical Exam    General: Frail appearing. No distress on [place] Cardiac: JVP difficult to assess. S1 and S2 present. No murmurs or rub. Resp: Lung sounds coarse Extremities: Warm and dry.  No peripheral  edema.  Neuro: Alert and oriented x3. Affect pleasant. Moves all extremities with weakness.   Telemetry   SR (personally reviewed)  Labs    CBC Recent Labs    01/20/24 0847 01/21/24 0456  WBC 10.6* 9.9  HGB 14.1 14.3  HCT 44.9 44.0  MCV 92.6 89.6  PLT 337 352   Basic Metabolic Panel Recent Labs    78/29/56 0456 01/21/24 1805 01/22/24 1025  NA 137 133* 135  K 4.0 4.4 4.6  CL 92* 90* 88*  CO2 34* 31 31  GLUCOSE 127* 122* 158*  BUN 15 24* 29*  CREATININE 0.90 0.97 1.21*  CALCIUM  9.1 9.5 9.9  MG 1.9   --  2.2  PHOS  --  5.2*  --    BNP (last 3 results) Recent Labs    07/02/23 1153 08/06/23 1802 01/17/24 1034  BNP 411.5* 238.2* 826.3*   Imaging   ECHOCARDIOGRAM COMPLETE Result Date: 01/21/2024    ECHOCARDIOGRAM REPORT   Patient Name:   Autumn [name] Ehle Date of Exam: 01/21/2024 Medical Rec #:  [id number]       Height:       64.0 in Accession #:    [id number]      Weight:       203.4 lb Date of Birth:  Dec 10, 1955       BSA:          1.970 m Patient Age:    67 years        BP:           91/66 mmHg Patient Gender: F               HR:           61 bpm. Exam Location:  Inpatient Procedure: 2D Echo, Cardiac Doppler and Color Doppler (Both Spectral and Color            Flow Doppler were utilized during procedure).  Indications:     CHF  History:         Patient has prior history of Echocardiogram examinations. CHF;                  Risk Factors:Hypertension.  Sonographer:     Farrell Honey Key Referring Phys:  [id number] [place] LEE Diagnosing Phys: Carson Clara MD IMPRESSIONS  1. Left ventricular ejection fraction, by estimation, is 35 to 40%. The left ventricle has moderately decreased function. The left ventricle demonstrates global hypokinesis. The left ventricular internal cavity size was moderately dilated. There is mild  left ventricular hypertrophy. Left ventricular diastolic parameters are consistent with Grade III diastolic dysfunction (restrictive).  2. Right ventricular systolic function is mildly reduced. The right ventricular size is normal. Tricuspid regurgitation signal is inadequate for assessing PA pressure.  3. Left atrial size was mildly dilated.  4. Right atrial size was mildly dilated.  5. The mitral valve is abnormal. Mild to moderate mitral valve regurgitation.  6. The aortic valve is tricuspid. Aortic valve regurgitation is trivial. Aortic valve sclerosis/calcification is present, without any evidence of aortic stenosis.  7. The inferior vena cava is normal in size with greater than 50%  respiratory variability, suggesting right atrial pressure of 3 mmHg. Comparison(s): Compared to prior echo 01/19/24, MR has improved. FINDINGS  Left Ventricle: Left ventricular ejection fraction, by estimation, is 35 to 40%. The left ventricle has moderately decreased function. The left ventricle demonstrates global hypokinesis. The left ventricular internal cavity size was moderately dilated. There is mild left ventricular hypertrophy. Left ventricular diastolic parameters are consistent with Grade III diastolic dysfunction (restrictive). Normal left ventricular filling pressure. Right Ventricle: The right ventricular size is normal. No increase in right ventricular wall thickness. Right ventricular systolic function is mildly reduced. Tricuspid regurgitation signal is inadequate for assessing PA pressure. Left Atrium: Left atrial size was mildly dilated. Right Atrium: Right atrial size was mildly dilated. Pericardium: There is no evidence of pericardial effusion. Mitral Valve: The mitral valve is abnormal. Mild to moderate mitral valve regurgitation. Tricuspid Valve: The tricuspid valve is normal in structure. Tricuspid valve regurgitation is trivial. Aortic Valve: The aortic valve is tricuspid. Aortic valve regurgitation is trivial. Aortic valve sclerosis/calcification is present, without any evidence of aortic stenosis. Pulmonic Valve: The pulmonic valve was grossly normal. Pulmonic valve regurgitation is trivial. Aorta: The aortic root and ascending aorta are structurally normal, with no evidence of dilitation. Venous: The inferior vena cava is normal in size with greater than 50% respiratory variability, suggesting right atrial pressure of 3 mmHg. IAS/Shunts: The interatrial septum was not well visualized.  LEFT VENTRICLE PLAX 2D LVIDd:         5.95 cm      Diastology LVIDs:         4.70 cm      LV e' medial:    4.13 cm/s LV PW:         1.10 cm      LV E/e' medial:  17.8 LV IVS:        1.00 cm      LV e'  lateral:   5.55 cm/s LVOT diam:     1.90 cm      LV E/e' lateral: 13.2 LV SV:         30 LV SV Index:   15 LVOT Area:     2.84 cm  LV Volumes (MOD) LV vol d, MOD A2C: 115.0 ml LV vol d, MOD A4C: 193.0 ml LV  vol s, MOD A2C: 74.9 ml LV vol s, MOD A4C: 98.1 ml LV SV MOD A2C:     40.1 ml LV SV MOD A4C:     193.0 ml LV SV MOD BP:      62.3 ml RIGHT VENTRICLE RV Basal diam:  3.70 cm RV S prime:     7.07 cm/s TAPSE (M-mode): 0.8 cm LEFT ATRIUM             Index        RIGHT ATRIUM           Index LA diam:        5.50 cm 2.79 cm/m   RA Area:     24.10 cm LA Vol (A2C):   83.1 ml 42.17 ml/m  RA Volume:   78.30 ml  39.74 ml/m LA Vol (A4C):   73.4 ml 37.25 ml/m LA Biplane Vol: 78.6 ml 39.89 ml/m  AORTIC VALVE LVOT Vmax:   70.30 cm/s LVOT Vmean:  48.000 cm/s LVOT VTI:    0.106 m  AORTA Ao Root diam: 3.00 cm Ao Asc diam:  3.40 cm MITRAL VALVE MV Area (PHT): 4.26 cm    SHUNTS MV Decel Time: 178 msec    Systemic VTI:  0.11 m MV E velocity: 73.40 cm/s  Systemic Diam: 1.90 cm MV A velocity: 29.30 cm/s MV E/A ratio:  2.51 Carson Clara MD Electronically signed by Carson Clara MD Signature Date/Time: 01/21/2024/4:18:54 PM    Final (Updated)     Medications:    Scheduled Medications:  acetaminophen   650 mg Oral QID   amiodarone   200 mg Oral BID   atorvastatin   80 mg Oral Daily   dapagliflozin  propanediol  10 mg Oral Daily   digoxin   0.125 mg Oral Daily   DULoxetine   60 mg Oral Daily   gabapentin   600 mg Oral TID   lidocaine   1 patch Transdermal Q24H   metoprolol  succinate  25 mg Oral Daily   Muscle Rub   Topical Daily   nicotine   21 mg Transdermal Daily   rivaroxaban   20 mg Oral Q supper   sacubitril -valsartan   1 tablet Oral BID   spironolactone   25 mg Oral Daily   torsemide   60 mg Oral BID   Infusions:   PRN Medications: hydrOXYzine , ipratropium-albuterol , methocarbamol , mouth rinse, polyethylene glycol, traMADol  Assessment/Plan   1. Acute on chronic systolic CHF: Echo in 6/22 with  EF 60-65%.  Had multiple echoes in 2024, each with lower EF.  Echo in 10/24 with EF 30-35%, moderate-severe MR.  She has a history of CAD but denies recent chest pain.  Cath 12/24 showed significant CAD but no critical lesion.  TEE 12/24 with EF 25-30%, moderate RV dysfunction, moderate MR. 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  TEE yesterday showed EF 30-35%, moderate LV dilation, mild RV dysfunction, moderate TR, severe mitral regurgitation with restriction of the posterior leaflet and malcoaptation. She required milrinone  with diuresis in 12/24.  She is readmitted in AF/RVR (last ECG in 1/25 also showed AF) off all her medications with significant volume overload on exam. Cr stable 0.9.  - Excellent diuresis, -11 L on admission.  Mild bump in creatinine this morning.  Converted to p.o. torsemide  60 mg twice daily today - Continue spironolactone  25 daily - Continue digoxin  0.125 daily - Continue Toprol  XL 25 mg daily.  - Continue Entresto  24/26 bid today.   - Continue Farixiga 10 mg daily - She is not a  candidate for advanced therapies with active substance abuse and noncompliance.   2. Atrial fibrillation: Persistent, multiple admissions with AF/RVR.  Cardioverted to NSR in 12/24 but stopped amiodarone  and Xarelto  when she went home and went back into AF.  She was in AF in 1/25, no ECGs after that until this admission when she was in AF with RVR.  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  She does not tolerate AF well. She had TEE-guided DCCV 6/11 and is back in NSR.  - Maintaining sinus rhythm, started PO amio 200 mg bid.  - Continue low dose Toprol  XL 25 daily and digoxin  0.125 daily. - Continue Xarelto   - Would favor eventual AF ablation.   3. 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 Moderate on TEE 12/24.  Severe mitral regurgitation on TEE 6/11 with restricted posterior leaflet and  malcoaptation.  - Repeat echo 6/13 shows significant improvement in MR since she has been cardioverted and diuresed, now mild to moderate  4. CAD: DES to LAD in 9/20.  No recent chest pain. LHC 12/24 showed patent LAD stent, 75% mid LCx stenosis and heavily calcified 60% proximal RCA stenosis. Managed medically given no critical lesion and no chest pain. Troponin minimally elevated with no trend.  - Continue atorvastatin .  - No ASA with Xarelto  use.   5. COPD: She smokes and uses home oxygen  at times.  - Needs to quit smoking.   6. Sciatica: Significant chronic pain.  Per [facility].   7. OSA: Suspected.  - Will need sleep study.   8. PFO: Small, noted on TEE.   9. Substance abuse: Cocaine in past though UDS did not show cocaine this admission. 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  Self-medicating for depression and back pain.  - Prev treated with Suboxone . Cymbalta  per primary.    Length of Stay: 4  [name], MD  01/22/2024, 11:51 AM

## 2024-01-22 NOTE — Progress Notes (Addendum)
 HD#5 SUBJECTIVE:  Patient Summary: Autumn [name] is a 68 y.o. with a pertinent PMH of HFrEF, A-fib on Xarelto , attention deficit disorder, COPD, chronic hypoxic respiratory failure on 2 L [place], anxiety and depression, fibromyalgia, HLD, and CAD , who presented with acute on chronic left hip pain and admitted for A-fib with RVR and HFrEF exacerbation.   Overnight Events and Interim History: NOE. Pt seen bedside this AM, complaining of major lower back pain with radiation down to her legs. She denies any chest pain, palpitations, or shortness of breath.   Significant Hospital Events:  DCCV/TEE on 6/11  OBJECTIVE:  Vital Signs: Vitals:   01/22/24 0000 01/22/24 0520 01/22/24 0635 01/22/24 0903  BP: 102/74 93/63  112/77  Pulse:  67    Resp:  19  20  Temp:  98.4 F (36.9 C)  98.2 F (36.8 C)  TempSrc:  Oral  Oral  SpO2: 91% 95%    Weight:   83.2 kg   Height:       Supplemental O2: Nasal Cannula SpO2: 95 % O2 Flow Rate (L/min): 4 L/min  Filed Weights   01/21/24 0500 01/21/24 0706 01/22/24 0635  Weight: 85.2 kg 92.3 kg 83.2 kg     Intake/Output Summary (Last 24 hours) at 01/22/2024 1014 Last data filed at 01/22/2024 0800 Gross per 24 hour  Intake 720 ml  Output 2825 ml  Net -2105 ml   Net IO Since Admission: -11,262.15 mL [01/22/24 1014]  Physical Exam: Physical Exam Constitutional:      General: She is not in acute distress.    Appearance: She is obese. She is not ill-appearing.     Comments: Restful.   Cardiovascular:     Rate and Rhythm: Normal rate and regular rhythm.     Pulses: Normal pulses.     Comments: Some PACs on tele. Sinus rhythm Pulmonary:     Effort: Pulmonary effort is normal.     Breath sounds: Normal breath sounds. No rales.  Abdominal:     General: Abdomen is flat.     Tenderness: There is no abdominal tenderness.   Musculoskeletal:     Right lower leg: Edema present.     Left lower leg: Edema present.     Comments: 1+ bilateral LE to mid  shin Moderate tenderness to palpation in lower back radiating bilaterally, unable to lay on back   Skin:    General: Skin is warm and dry.   Neurological:     General: No focal deficit present.     Mental Status: She is alert. Mental status is at baseline.   Psychiatric:        Mood and Affect: Mood normal.        Behavior: Behavior normal.    Patient Lines/Drains/Airways Status     Active Line/Drains/Airways     Name Placement date Placement time Site Days   Peripheral IV 01/19/24 22 G Anterior;Right Forearm 01/19/24  0822  Forearm  2   Peripheral IV 01/19/24 20 G Posterior;Right Forearm 01/19/24  0945  Forearm  2   Peripheral IV 01/20/24 22 G Right Hand 01/20/24  1200  Hand  1            ASSESSMENT/PLAN:  Assessment: Principal Problem:   Atrial fibrillation with RVR (HCC) Active Problems:   Polysubstance abuse (HCC)   Tobacco dependence   COPD (chronic obstructive pulmonary disease) (HCC)   CAD (coronary artery disease) S/P stent to LAD   Acute on chronic  systolic CHF (congestive heart failure) (HCC)   Left hip pain   Anxiety state  Autumn [name] is a 68 y.o. woman with PMH of HFrEF, A-fib on Xarelto , attention deficit disorder, COPD, chronic hypoxic respiratory failure on 2 L [place], anxiety and depression, fibromyalgia, HLD, and CAD.   She presented to [facility] ED 6/9 with complaint of acute on chronic left hip pain and was subsequently found to be in A-fib with RVR and volume overloaded.  Plan: Acute HFrEF with EF 25-30% Volume overload Severe Mitral Regurgitation Hx COPD, Chronic hypoxic respiratory failure on 2L O2 Cardiology consulting, thank you. On room air to 2L, not hypoxic, no dyspnea. No increase in Cr after lasix  gtt. Still mild LE edema.. - Torsemide  60 mg bid for diuresis. This is her PTH and likely discharge regimen. - GDMT with spiro 25, toprol  XL 25, Entresto  24/26, Farxiga  10 - Losartan  12.5 daily - Dixogin 0.125 daily - Strict ins and  outs, daily weights. - Repeat ECHO showed 35-40, decreased function, MR has improved from prior echo - DuoNebs every 6 as needed - Recs per cardiology  Atrial Fibrillation with RVR s/p Cardioversion 6/11 now NSR Cardiology consulting, thank you. Remains sinus rhythm with some PACs/PVCs and rates around 70. Stable. Amiodarone  infusion stopped yesterday evening with asymptomatic bradycardia in the 50s. Overall, suspect tachyarrhythmia drives her cardiomyopathy - and med adherence out of hospital is poor - favor eventual AF ablation. - Continue home anticoagulation with Xarelto  20 daily - Continue toprol  XL 25 daily - Continuing amiodarone  200mg  - Recs per cardiology  GAD/MDD Anxiety waxes and wanes daily, but severe and tearful at times. Usually redirectable. Shares that she takes unprescribed Xanax  due to this. Feels that most treatment per doctors in past not helpful. Prescribed aripiprazole  in past but not filled since 2024. Questionable med adherence.   - Cont cymbalta  60mg ,  Hope this will have added benefit to help sciatica. - Cont gabapentin  600 TID. Reports this can cause tremors, will monitor. - Hydroxyzine  TID prn  L hip pain, sciatic pain Comes and goes, today is much worse as she is not able to lay on her back. When she points to the pain its mostly in her lower back. Prior imaging from December of 2024 shows significant lumbar denerative disc disease with disc space narrowing, and marginal osteophytes most severe through L3-L4-L5. Multiple regimens have been trialed, hoping to add on a muscle relaxer to help with her pain. Sometimes it gets to the point where she isn't able to ambulate, such as today.   - Avoid narcotic pain medicine - Tylenol  650 TID - Cont gabapentin  and Cymbalta  per above - Lidocaine  patch, heating pad prn - Adding Robaxin  750mg  - Working with PT/OT - rec HHPT  Substance use disorder Resolved Hypersomnolence from substance use Alert through interview today,  appears tired, AOx3. Suspect admission hypersomnolence was a drug effect. On suboxone  in past. - VBG ruled out hypercarbia - Avoid sedating medications including narcotic pain medicine  - Supportive care - Recommend abstinence from substances - TOC for resources   OSA COPD on 2L at baseline not in exacerbation Diagnosed but does not ear CPAP. - rec outpatient pulm eval  CAD Atorvastatin  80 daily. No ASA as on Xarelto .  Medication noncompliance Poor adherence out of hospital.  She does not give a clear reason why.  She cannot confirm her medication list.  Control of cardiovascular/pain/mood disease difficult due to this.  Cigarette smoker 1 ppd. Will give a  nicotine  patch. She should quit.  Best Practice: Diet: Cardiac diet IVF: none VTE: DOAC Code: Full Therapy Recs: HHPT DISPO: Anticipated discharge in 1-5 days to Home pending HF decongesion, Afib therapy.  Signature: Creston Klas, MD Internal Medicine Resident, PGY-2 [facility]  Pager: # [phone]. 10:14 AM, 01/22/2024

## 2024-01-22 NOTE — Plan of Care (Signed)
  Problem: Education: Goal: Knowledge of General Education information will improve Description: Including pain rating scale, medication(s)/side effects and non-pharmacologic comfort measures Outcome: Progressing   Problem: Clinical Measurements: Goal: Will remain free from infection Outcome: Progressing   Problem: Elimination: Goal: Will not experience complications related to urinary retention Outcome: Progressing   Problem: Clinical Measurements: Goal: Respiratory complications will improve Outcome: Not Progressing   Problem: Pain Managment: Goal: General experience of comfort will improve and/or be controlled Outcome: Not Progressing

## 2024-01-22 NOTE — Progress Notes (Signed)
 Mobility Specialist Progress Note;   01/22/24 1000  Mobility  Activity Transferred to/from [facility];Ambulated with assistance in room  Level of Assistance Minimal assist, patient does 75% or more  Assistive Device [employer] Ambulated (ft) 15 ft  Activity Response Tolerated fair  Mobility Referral Yes  Mobility visit 1 Mobility  Mobility Specialist Start Time (ACUTE ONLY) 1000  Mobility Specialist Stop Time (ACUTE ONLY) 1015  Mobility Specialist Time Calculation (min) (ACUTE ONLY) 15 min   Pt on BSC upon arrival w/ RN, agreeable to mobility. Required MinA for short ambulation in room as pt was limited d/t back pain. Pt required VC for safety as pt would impulsively sit down in room w/ no warning. Ambulated in room on 3LO2. Requested to use BSC at EoS< Bm successful. Pt tearful throughput session, attempted redirection. Pt returned back to bed with all needs met, call bell in reach. Alarm on.   Janit Meline Mobility Specialist Please contact via SecureChat or [employer] [phone]

## 2024-01-23 DIAGNOSIS — N179 Acute kidney failure, unspecified: Secondary | ICD-10-CM

## 2024-01-23 DIAGNOSIS — I340 Nonrheumatic mitral (valve) insufficiency: Secondary | ICD-10-CM | POA: Diagnosis not present

## 2024-01-23 DIAGNOSIS — F17210 Nicotine dependence, cigarettes, uncomplicated: Secondary | ICD-10-CM | POA: Diagnosis not present

## 2024-01-23 DIAGNOSIS — I4819 Other persistent atrial fibrillation: Secondary | ICD-10-CM | POA: Diagnosis not present

## 2024-01-23 DIAGNOSIS — I5023 Acute on chronic systolic (congestive) heart failure: Secondary | ICD-10-CM | POA: Diagnosis not present

## 2024-01-23 DIAGNOSIS — I5021 Acute systolic (congestive) heart failure: Secondary | ICD-10-CM | POA: Diagnosis not present

## 2024-01-23 DIAGNOSIS — I4891 Unspecified atrial fibrillation: Secondary | ICD-10-CM | POA: Diagnosis not present

## 2024-01-23 LAB — BASIC METABOLIC PANEL WITH GFR
Anion gap: 19 — ABNORMAL HIGH (ref 5–15)
BUN: 44 mg/dL — ABNORMAL HIGH (ref 8–23)
CO2: 27 mmol/L (ref 22–32)
Calcium: 9.8 mg/dL (ref 8.9–10.3)
Chloride: 86 mmol/L — ABNORMAL LOW (ref 98–111)
Creatinine, Ser: 1.8 mg/dL — ABNORMAL HIGH (ref 0.44–1.00)
GFR, Estimated: 31 mL/min — ABNORMAL LOW (ref >60)
Glucose, Bld: 139 mg/dL — ABNORMAL HIGH (ref 70–99)
Potassium: 4.6 mmol/L (ref 3.5–5.1)
Sodium: 132 mmol/L — ABNORMAL LOW (ref 135–145)

## 2024-01-23 LAB — MAGNESIUM: Magnesium: 2.3 mg/dL (ref 1.7–2.4)

## 2024-01-23 MED ORDER — TRIMETHOBENZAMIDE HCL 100 MG/ML IM SOLN: 200.0000 mg | Freq: Four times a day (QID) | INTRAMUSCULAR | Status: DC | PRN

## 2024-01-23 MED ORDER — RIVAROXABAN 15 MG PO TABS
15.0000 mg | ORAL_TABLET | Freq: Every day | ORAL | Status: DC
  Administered 2024-01-23 – 2024-01-24 (×2): 15 mg via ORAL
  Filled 2024-01-23 (×2): qty 1

## 2024-01-23 MED ORDER — GABAPENTIN 300 MG PO CAPS: 600.0000 mg | ORAL_CAPSULE | Freq: Three times a day (TID) | ORAL | Status: DC | PRN

## 2024-01-23 MED ORDER — LACTATED RINGERS IV BOLUS
500.0000 mL | Freq: Once | INTRAVENOUS | Status: AC
  Administered 2024-01-23: 500 mL via INTRAVENOUS

## 2024-01-23 NOTE — Progress Notes (Signed)
 Mobility Specialist Progress Note;   01/23/24 1022  Therapy Vitals  BP (!) 88/46  Mobility  Activity Dangled on edge of bed  Level of Assistance Contact guard assist, steadying assist  Assistive Device None  Activity Response Tolerated fair  Mobility Referral Yes  Mobility visit 1 Mobility  Mobility Specialist Start Time (ACUTE ONLY) 1022  Mobility Specialist Stop Time (ACUTE ONLY) 1036  Mobility Specialist Time Calculation (min) (ACUTE ONLY) 14 min   Pt agreeable to mobility. Required MinG assistance to come sitting on EoB. Upon sitting, pt c/o dizziness. BP taken 88/46 (55). RN deferred further mobility and pt was assisted back to supine. Pt left in bed with all needs met, call bell in reach.   Janit Meline Mobility Specialist Please contact via SecureChat or [employer] [phone]

## 2024-01-23 NOTE — Progress Notes (Signed)
 Patient ID: Autumn [name], female   DOB: [birth year], 68 y.o.   MRN: [id number]     Cardiology Rounding Note  Cardiologist: Jerryl Morin, DO  Chief Complaint: CHF  Subjective:    6/11: TEE-guided DCCV, she went back into NSR on 2nd shock. TEE showed LV EF 30-35%, moderate LV dilation, mild RV dysfunction, moderate TR, severe MR with restriction of the posterior leaflet and malcoaptation.   Net -11 L on admission.  Worsening renal function (0.97 > 1.21 > 1.80).  BP down to 79/62 this morning.  She has any chest pain or dyspnea  Objective:    Weight Range: 81 kg Body mass index is 30.64 kg/m.   Vital Signs:   Temp:  [97.8 F (36.6 C)-98.8 F (37.1 C)] 98.3 F (36.8 C) (06/15 0900) Pulse Rate:  [72-74] 72 (06/15 0025) Resp:  [18-20] 20 (06/15 0025) BP: (73-114)/(56-98) 79/62 (06/15 0900) SpO2:  [90 %-100 %] 100 % (06/15 0900) Weight:  [81 kg] 81 kg (06/15 0500) Last BM Date : 01/20/24  Weight change: Filed Weights   01/21/24 0706 01/22/24 0635 01/23/24 0500  Weight: 92.3 kg 83.2 kg 81 kg   Intake/Output:  Intake/Output Summary (Last 24 hours) at 01/23/2024 0951 Last data filed at 01/22/2024 2106 Gross per 24 hour  Intake 240 ml  Output --  Net 240 ml    Physical Exam    General: Frail appearing. No distress on [place] Cardiac: JVP difficult to assess. S1 and S2 present. No murmurs or rub. Resp: Lung sounds coarse Extremities: Warm and dry.  No peripheral  edema.  Neuro: Alert and oriented x3. Affect pleasant. Moves all extremities with weakness.   Telemetry   SR (personally reviewed)  Labs    CBC Recent Labs    01/21/24 0456  WBC 9.9  HGB 14.3  HCT 44.0  MCV 89.6  PLT 352   Basic Metabolic Panel Recent Labs    [birth year] 1805 01/22/24 1025 01/23/24 0515  NA 133* 135 132*  K 4.4 4.6 4.6  CL 90* 88* 86*  CO2 31 31 27   GLUCOSE 122* 158* 139*  BUN 24* 29* 44*  CREATININE 0.97 1.21* 1.80*  CALCIUM  9.5 9.9 9.8  MG  --  2.2 2.3  PHOS 5.2*  --   --     BNP (last 3 results) Recent Labs    07/02/23 1153 08/06/23 1802 01/17/24 1034  BNP 411.5* 238.2* 826.3*   Imaging   No results found.   Medications:    Scheduled Medications:  acetaminophen   650 mg Oral QID   amiodarone   200 mg Oral BID   atorvastatin   80 mg Oral Daily   digoxin   0.125 mg Oral Daily   DULoxetine   60 mg Oral Daily   gabapentin   600 mg Oral TID   lidocaine   1 patch Transdermal Q24H   metoprolol  succinate  25 mg Oral Daily   Muscle Rub   Topical Daily   nicotine   21 mg Transdermal Daily   rivaroxaban   20 mg Oral Q supper   Infusions:  lactated ringers      PRN Medications: hydrOXYzine , ipratropium-albuterol , methocarbamol , mouth rinse, polyethylene glycol, traMADol  Assessment/Plan   1. Acute on chronic systolic CHF: Echo in 6/22 with EF 60-65%.  Had multiple echoes in 2024, each with lower EF.  Echo in 10/24 with EF 30-35%, moderate-severe MR.  She has a history of CAD but denies recent chest pain.  Cath 12/24 showed significant CAD but no critical lesion.  TEE 12/24 with EF 25-30%, moderate RV dysfunction, moderate MR. 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  TEE yesterday showed EF 30-35%, moderate LV dilation, mild RV dysfunction, moderate TR, severe mitral regurgitation with restriction of the posterior leaflet and malcoaptation. She required milrinone  with diuresis in 12/24.  She is readmitted in AF/RVR (last ECG in 1/25 also showed AF) off all her medications with significant volume overload on exam. Cr stable 0.9.  - Has had excellent diuresis -11 L on admission.  Was converted to p.o. torsemide  yesterday but with rising creatinine, will hold all diuretics today.  BP is soft, agree with giving small IVF bolus this morning - Hold Entresto  given low BP and AKI - Hold digoxin  given her AKI - She is not a candidate for advanced therapies with active substance abuse and noncompliance.   2.  Atrial fibrillation: Persistent, multiple admissions with AF/RVR.  Cardioverted to NSR in 12/24 but stopped amiodarone  and Xarelto  when she went home and went back into AF.  She was in AF in 1/25, no ECGs after that until this admission when she was in AF with RVR.  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  She does not tolerate AF well. She had TEE-guided DCCV 6/11 and is back in NSR.  - Maintaining sinus rhythm, started PO amio 200 mg bid.  - Continue low dose Toprol  XL 25 daily.  Holding digoxin  for now given AKI - Continue Xarelto   - Would favor eventual AF ablation.   3. 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 Moderate on TEE 12/24.  Severe mitral regurgitation on TEE 6/11 with restricted posterior leaflet and malcoaptation.  - Repeat echo 6/13 shows significant improvement in MR since she has been cardioverted and diuresed, now mild to moderate  4. CAD: DES to LAD in 9/20.  No recent chest pain. LHC 12/24 showed patent LAD stent, 75% mid LCx stenosis and heavily calcified 60% proximal RCA stenosis. Managed medically given no critical lesion and no chest pain. Troponin minimally elevated with no trend.  - Continue atorvastatin .  - No ASA with Xarelto  use.   5. COPD: She smokes and uses home oxygen  at times.  - Needs to quit smoking.   6. Sciatica: Significant chronic pain.  Per [facility].   7. OSA: Suspected.  - Will need sleep study.   8. PFO: Small, noted on TEE.   9. Substance abuse: Cocaine in past though UDS did not show cocaine this admission. 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  Self-medicating for depression and back pain.  - Prev treated with Suboxone . Cymbalta  per primary.    10. AKI: Worsening renal function (0.97 > 1.21 > 1.80) over the last several days, suspect overdiuresis.  IV Lasix  was discontinued and she was converted to p.o. torsemide  yesterday but with creatinine rising, will hold all diuretics  today and give small IVF bolus  Length of Stay: 5  Autumn Hamburg, MD  01/23/2024, 9:51 AM

## 2024-01-23 NOTE — Plan of Care (Signed)

## 2024-01-23 NOTE — Progress Notes (Addendum)
 HD#5 SUBJECTIVE:  Patient Summary: Autumn [name] is a 68 y.o. with a pertinent PMH of HFrEF, A-fib on Xarelto , attention deficit disorder, COPD, chronic hypoxic respiratory failure on 2 L [place], anxiety and depression, fibromyalgia, HLD, and CAD , who presented with acute on chronic left hip pain and admitted for A-fib with RVR and HFrEF exacerbation.   Overnight Events and Interim History: Some overnight soft blood pressures, but not overt hypotension. Also had one episode of emesis this am that she states is chronic for her. Denies CP, palpitations, dyspnea. She is concerned about her leg pain.  Significant Hospital Events: DCCV/TEE on 6/11   OBJECTIVE:  Vital Signs: Vitals:   01/23/24 0025 01/23/24 0500 01/23/24 0900 01/23/24 1057  BP: 96/68  (!) 79/62 (!) 91/51  Pulse: 72     Resp: 20     Temp: 98.2 F (36.8 C)  98.3 F (36.8 C)   TempSrc: Oral  Oral   SpO2: 91%  100%   Weight:  81 kg    Height:       Supplemental O2: Room Air SpO2: 100 % O2 Flow Rate (L/min): 4 L/min  Filed Weights   01/21/24 0706 01/22/24 0635 01/23/24 0500  Weight: 92.3 kg 83.2 kg 81 kg     Intake/Output Summary (Last 24 hours) at 01/23/2024 1114 Last data filed at 01/22/2024 2106 Gross per 24 hour  Intake 240 ml  Output --  Net 240 ml   Net IO Since Admission: -11,022.15 mL [01/23/24 1114]      Physical Exam: Physical Exam Constitutional:      General: She is not in acute distress.    Appearance: She is obese. She is not ill-appearing.     Comments: Restful.    Cardiovascular:     Rate and Rhythm: Normal rate and regular rhythm.     Pulses: Normal pulses.     Comments: Some PACs on tele. Sinus rhythm Pulmonary:     Effort: Pulmonary effort is normal.     Breath sounds: Normal breath sounds. No rales.  Abdominal:     General: Abdomen is flat.     Tenderness: There is no abdominal tenderness.    Musculoskeletal:     Right lower leg: Edema present.     Left lower leg: Edema  present.     Comments: trace bilateral LE to mid shin. Warm. Moderate tenderness to palpation in lower back radiating bilaterally, unable to lay on back    Skin:    General: Skin is warm and dry.    Neurological:     General: No focal deficit present.     Mental Status: She is alert. Mental status is at baseline.    Psychiatric:        Mood and Affect: Mood normal.        Behavior: Behavior normal.      Patient Lines/Drains/Airways Status       Active Line/Drains/Airways       Name Placement date Placement time Site Days    Peripheral IV 01/19/24 22 G Anterior;Right Forearm 01/19/24  0822  Forearm  2    Peripheral IV 01/19/24 20 G Posterior;Right Forearm 01/19/24  0945  Forearm  2    Peripheral IV 01/20/24 22 G Right Hand 01/20/24  1200  Hand  1                  ASSESSMENT/PLAN:  Assessment: Principal Problem:   Atrial fibrillation with RVR (HCC) Active Problems:  Polysubstance abuse (HCC)   Tobacco dependence   COPD (chronic obstructive pulmonary disease) (HCC)   CAD (coronary artery disease) S/P stent to LAD   Acute on chronic systolic CHF (congestive heart failure) (HCC)   Left hip pain   Anxiety state   Autumn [name] is a 68 y.o. woman with PMH of HFrEF, A-fib on Xarelto , attention deficit disorder, COPD, chronic hypoxic respiratory failure on 2 L [place], anxiety and depression, fibromyalgia, HLD, and CAD.    She [name] with complaint of acute on chronic left hip pain and was subsequently found to be in A-fib with RVR and volume overloaded.   Plan: Acute HFrEF with EF 25-30% improved to 35-40% Volume overload Severe Mitral Regurgitation Hx COPD, Chronic hypoxic respiratory failure on 2L O2 Hypotension, not in shock Cardiology consulting, thank you. On room air to 2L, not hypoxic, no dyspnea. Cr now trending up, 1.8 from .9 over 2 days, and hypotensive overnight. Suspect we need to scale back on diuresis and GDMT. Will hold her diuretic  and other GDMT for now. Will provide small bolus of LR.  - Cont metoprolol  succinate 25mg  daily and amiodarone  200 BID  For now, will hold but anticipate resuming: - Torsemide  60 mg bid for diuresis. This is her PTH regimen. - GDMT Entresto  24/26, spiro 25, Farxiga  10 - Dixogin 0.125 daily  - Strict ins and outs, daily weights. - Repeat ECHO showed 35-40, decreased function, MR has improved from prior echo - DuoNebs every 6 as needed - Recs per cardiology   Atrial Fibrillation with RVR s/p Cardioversion 6/11 now NSR Cardiology consulting, thank you. Remains sinus rhythm with some PACs/PVCs and rates around 70. Stable. Amiodarone  infusion stopped yesterday evening with asymptomatic bradycardia in the 50s. Overall, suspect tachyarrhythmia drives her cardiomyopathy - and med adherence out of hospital is poor - favor eventual AF ablation. - Continue home anticoagulation with Xarelto  20 daily - Continue toprol  XL 25 daily - Continuing amiodarone  200mg  - Recs per cardiology   GAD/MDD Anxiety waxes and wanes daily, but severe and tearful at times. Usually redirectable. Shares that she takes unprescribed Xanax  due to this. Feels that most treatment per doctors in past not helpful. Prescribed aripiprazole  in past but not filled since 2024. Questionable med adherence.   - Cont cymbalta  60mg ,  Hope this will have added benefit to help sciatica. - Cont gabapentin  600 TID. Reports this can cause tremors, will monitor. - Hydroxyzine  TID prn   L hip pain, sciatic pain Comes and goes. When she points to the pain its mostly in her lower back. Prior imaging from December of 2024 shows significant lumbar denerative disc disease with disc space narrowing, and marginal osteophytes most severe through L3-L4-L5. Multiple regimens have been trialed, hoping to add on a muscle relaxer to help with her pain. Sometimes it gets to the point where she isn't able to ambulate, such as today.   - Avoid narcotic pain  medicine - Tylenol  650 TID - Cont gabapentin  and Cymbalta  per above - Lidocaine  patch, heating pad prn - Adding Robaxin  750mg  - Working with PT/OT - rec HHPT   Substance use disorder Resolved Hypersomnolence from substance use Alert through interview today, appears tired, AOx3. Suspect admission hypersomnolence was a drug effect. On suboxone  in past. - VBG ruled out hypercarbia - Avoid sedating medications including narcotic pain medicine  - Supportive care - Recommend abstinence from substances - TOC for resources    OSA COPD on 2L  at baseline not in exacerbation Diagnosed but does not ear CPAP. - rec outpatient pulm eval   CAD Atorvastatin  80 daily. No ASA as on Xarelto .   Medication noncompliance Poor adherence out of hospital.  She does not give a clear reason why.  She cannot confirm her medication list.  Control of cardiovascular/pain/mood disease difficult due to this.   Cigarette smoker 1 ppd. Will give a nicotine  patch. She should quit.  Best Practice: Diet: Cardiac diet IVF: none VTE: DOAC Code: Full Therapy Recs: HHPT DISPO: Anticipated discharge in 1-5 days to Home pending HF decongesion, Afib therapy.  Signature: Carleen Chary, D.O.  Internal Medicine Resident, PGY-1 [name]  Pager: # [phone]. 11:14 AM, 01/23/2024

## 2024-01-24 ENCOUNTER — Inpatient Hospital Stay (HOSPITAL_COMMUNITY)

## 2024-01-24 DIAGNOSIS — I959 Hypotension, unspecified: Secondary | ICD-10-CM

## 2024-01-24 DIAGNOSIS — I5023 Acute on chronic systolic (congestive) heart failure: Secondary | ICD-10-CM | POA: Diagnosis not present

## 2024-01-24 DIAGNOSIS — I340 Nonrheumatic mitral (valve) insufficiency: Secondary | ICD-10-CM | POA: Diagnosis not present

## 2024-01-24 DIAGNOSIS — F32A Depression, unspecified: Secondary | ICD-10-CM

## 2024-01-24 DIAGNOSIS — I4891 Unspecified atrial fibrillation: Secondary | ICD-10-CM | POA: Diagnosis not present

## 2024-01-24 DIAGNOSIS — I4819 Other persistent atrial fibrillation: Secondary | ICD-10-CM | POA: Diagnosis not present

## 2024-01-24 DIAGNOSIS — Z91148 Patient's other noncompliance with medication regimen for other reason: Secondary | ICD-10-CM

## 2024-01-24 LAB — BASIC METABOLIC PANEL WITH GFR
Anion gap: 14 (ref 5–15)
BUN: 55 mg/dL — ABNORMAL HIGH (ref 8–23)
CO2: 31 mmol/L (ref 22–32)
Calcium: 9.2 mg/dL (ref 8.9–10.3)
Chloride: 84 mmol/L — ABNORMAL LOW (ref 98–111)
Creatinine, Ser: 1.87 mg/dL — ABNORMAL HIGH (ref 0.44–1.00)
GFR, Estimated: 29 mL/min — ABNORMAL LOW (ref >60)
Glucose, Bld: 127 mg/dL — ABNORMAL HIGH (ref 70–99)
Potassium: 3.9 mmol/L (ref 3.5–5.1)
Sodium: 129 mmol/L — ABNORMAL LOW (ref 135–145)

## 2024-01-24 LAB — MAGNESIUM: Magnesium: 2.2 mg/dL (ref 1.7–2.4)

## 2024-01-24 MED ORDER — ENSURE PLUS HIGH PROTEIN PO LIQD
237.0000 mL | Freq: Two times a day (BID) | ORAL | Status: DC
  Administered 2024-01-24 – 2024-01-29 (×8): 237 mL via ORAL

## 2024-01-24 MED ORDER — TORSEMIDE 20 MG PO TABS
60.0000 mg | ORAL_TABLET | Freq: Every day | ORAL | Status: DC
  Administered 2024-01-24 – 2024-01-26 (×3): 60 mg via ORAL
  Filled 2024-01-24 (×3): qty 3

## 2024-01-24 MED ORDER — AMIODARONE HCL IN DEXTROSE 360-4.14 MG/200ML-% IV SOLN
60.0000 mg/h | INTRAVENOUS | Status: DC
  Administered 2024-01-25 – 2024-01-26 (×3): 30 mg/h via INTRAVENOUS
  Filled 2024-01-24 (×4): qty 200

## 2024-01-24 MED ORDER — AMIODARONE LOAD VIA INFUSION
150.0000 mg | Freq: Once | INTRAVENOUS | Status: AC
  Administered 2024-01-24: 150 mg via INTRAVENOUS
  Filled 2024-01-24: qty 83.34

## 2024-01-24 MED ORDER — AMIODARONE HCL IN DEXTROSE 360-4.14 MG/200ML-% IV SOLN
60.0000 mg/h | INTRAVENOUS | Status: AC
  Administered 2024-01-24 (×2): 60 mg/h via INTRAVENOUS
  Filled 2024-01-24: qty 200

## 2024-01-24 MED ORDER — PREGABALIN 25 MG PO CAPS
75.0000 mg | ORAL_CAPSULE | Freq: Two times a day (BID) | ORAL | Status: DC
  Administered 2024-01-24 – 2024-01-29 (×11): 75 mg via ORAL
  Filled 2024-01-24 (×2): qty 3
  Filled 2024-01-24 (×4): qty 1
  Filled 2024-01-24: qty 3
  Filled 2024-01-24 (×2): qty 1
  Filled 2024-01-24 (×2): qty 3

## 2024-01-24 NOTE — Progress Notes (Signed)
 HD#6 SUBJECTIVE:  Patient Summary: Autumn [name] is a 68 y.o. with a pertinent PMH of HFrEF, A-fib on Xarelto , attention deficit disorder, COPD, chronic hypoxic respiratory failure on 2 L [place], anxiety and depression, fibromyalgia, HLD, and CAD , who presented with acute on chronic left hip pain and admitted for A-fib with RVR and HFrEF exacerbation.   Overnight Events and Interim History: No acute events overnight. This AM she complains of severe back pain, the L lower back along the iliac crest that occasionally shoots to the leg and varies with time and position. Denies CP, palpitations, shortness of breath.  Significant Hospital Events:  6/11 DCCV/TEE 6/15 back to Afib, not RVR  OBJECTIVE:  Vital Signs: Vitals:   01/23/24 2000 01/24/24 0028 01/24/24 0410 01/24/24 0753  BP: 91/68 91/78 (!) 113/57 106/71  Pulse: 99 (!) 108 (!) 109 80  Resp: 18 16 18 18   Temp: 97.6 F (36.4 C) 98.1 F (36.7 C) 98.3 F (36.8 C) 98.2 F (36.8 C)  TempSrc: Oral Oral Oral Oral  SpO2: 90% 93% 93% 93%  Weight:   84.2 kg   Height:       Supplemental O2: Nasal Cannula SpO2: 93 % O2 Flow Rate (L/min): 4 L/min  Filed Weights   01/22/24 0635 01/23/24 0500 01/24/24 0410  Weight: 83.2 kg 81 kg 84.2 kg    Intake/Output Summary (Last 24 hours) at 01/24/2024 1110 Last data filed at 01/24/2024 0807 Gross per 24 hour  Intake 988.82 ml  Output 700 ml  Net 288.82 ml   Net IO Since Admission: -10,733.33 mL [01/24/24 1110]  Physical Exam: Physical Exam Constitutional:      General: She is not in acute distress.    Appearance: She is obese. She is not ill-appearing.     Comments: Very uncomfortable due to back pain  HENT:     Mouth/Throat:     Mouth: Mucous membranes are moist.  Neck:     Comments: No JVD Cardiovascular:     Rate and Rhythm: Tachycardia present. Rhythm irregular.     Pulses: Normal pulses.  Pulmonary:     Effort: Pulmonary effort is normal.     Breath sounds: No wheezing or  rales.  Abdominal:     General: Abdomen is flat.     Tenderness: There is no abdominal tenderness.   Musculoskeletal:        General: No tenderness.     Right lower leg: Edema present.     Left lower leg: No edema.     Comments: Trace edema LLE.  Tenderness in L lumbar paraspinal back, not midline, no skin changes or edema, ROM limited by pain   Skin:    Comments: Reduced skin turgor hands   Neurological:     General: No focal deficit present.     Mental Status: She is alert and oriented to person, place, and time.   Psychiatric:        Mood and Affect: Mood normal.     Comments: Very expressive about pain, tearful at times, seems to relax with distraction    Patient Lines/Drains/Airways Status     Active Line/Drains/Airways     Name Placement date Placement time Site Days   Peripheral IV 01/19/24 22 G Anterior;Right Forearm 01/19/24  0822  Forearm  5            ASSESSMENT/PLAN:  Assessment: Principal Problem:   Atrial fibrillation with RVR (HCC) Active Problems:   Polysubstance abuse (HCC)  Tobacco dependence   COPD (chronic obstructive pulmonary disease) (HCC)   Persistent atrial fibrillation (HCC)   CAD (coronary artery disease) S/P stent to LAD   Acute on chronic systolic CHF (congestive heart failure) (HCC)   Left hip pain   Anxiety state   AKI (acute kidney injury) (HCC)  Autumn [name] is a 68 y.o. with a pertinent PMH of HFrEF, A-fib on Xarelto , attention deficit disorder, COPD, chronic hypoxic respiratory failure on 2 L [place], anxiety and depression, fibromyalgia, HLD, and CAD , who presented with acute on chronic left hip pain and admitted for A-fib with RVR and HFrEF exacerbation.   Plan: Acute HFrEF with EF 25-30% improved to 35-40% Volume overload Severe Mitral Regurgitation Hx COPD, Chronic hypoxic respiratory failure on 2L O2 Hypotension, not in shock Cardiology consulting, thank you. Fluctuates room air to 4L, no recorded hypoxia and no  dyspnea. Cr up 1.8 from .9 and stabilizing. Diuresis held over weekend with AKI and hypotension. Total 11L diuresis this hospitalization. Weight is up 3kg since yesterday. MAPs above 65 and not symptomatic.  - Resume oral diuresis torsemide  60 daily. Watch Cr. PTH regimen was BID. - Cont metoprolol  succinate 25mg  daily    For now given recent hypotension, will hold but anticipate resuming: - GDMT Entresto  24/26, spiro 25, Farxiga  10 - Dixogin 0.125 daily   - Strict ins and outs, daily weights. - DuoNebs every 6 as needed - Recs per cardiology   Atrial Fibrillation with RVR s/p Cardioversion 6/11 now NSR Cardiology consulting, thank you. Back to afib yesterday afternoon, but not rapid and hemodynamically stable. Return to amiodarone  bolus/infusion and anticipate cardioversion Wednesday. Overall, suspect tachyarrhythmia drives her cardiomyopathy - and med adherence out of hospital is poor - favor eventual AF ablation. - Continue home anticoagulation with Xarelto  20 daily - Continue toprol  XL 25 daily - Amiodarone  bolus/infusion - Recs per cardiology   L hip pain, sciatic pain Comes and goes but quite severe this morning. Will collect a lumbar CT. - Avoid narcotic pain medicine - Tylenol  650 TID - Cont lyrica  and Cymbalta  per above - Lidocaine  patch, heating pad prn - Adding Robaxin  750mg  - Working with PT/OT - rec HHPT  GAD/MDD Report of hallucination Anxiety waxes and wanes daily, but severe and tearful at times. Usually redirectable. Shares that she takes unprescribed Xanax  due to this. Feels that most treatment per doctors in past not helpful. Prescribed aripiprazole  in past but not filled since 2024. Questionable med adherence.  I will monitor this report of a hallucination, she is always appropriately oriented with me making me doubt delirium. If recurrent, would consider a psych consult. - Cont cymbalta  60mg ,  Hope this will have added benefit to help sciatica. - Stop gabapentin   per report of tremors. Will start lyrica  75 BID.  - Hydroxyzine  TID prn    Substance use disorder Resolved Hypersomnolence from substance use Remains AOx3. Suspect admission hypersomnolence was a drug effect. On suboxone  in past. - VBG ruled out hypercarbia - Avoid sedating medications including narcotic pain medicine  - Supportive care - Recommend abstinence from substances - TOC for resources    OSA COPD on 2L at baseline not in exacerbation Diagnosed but does not wear CPAP. - rec outpatient pulm eval   CAD Atorvastatin  80 daily. No ASA as on Xarelto .   Medication noncompliance Poor adherence out of hospital.  She does not give a clear reason why.  She cannot confirm her medication list.  Control of cardiovascular/pain/mood disease difficult  due to this.   Cigarette smoker 1 ppd. Will give a nicotine  patch. She should quit.  Best Practice: Diet: Cardiac diet IVF: none VTE: DOAC Code: Full Therapy Recs: HHPT DISPO: Anticipated discharge in 1-5 days to Home pending HF decongesion, Afib therapy.  Signature: Carleen Chary, D.O.  Internal Medicine Resident, PGY-1 [facility]  Pager: # [phone]. 11:10 AM, 01/24/2024

## 2024-01-24 NOTE — Progress Notes (Signed)
 pt back in afib 104-110 and it has sustained MD informed advised to monitor and report if rates climb

## 2024-01-24 NOTE — Progress Notes (Signed)
 Occupational Therapy Treatment Patient Details Name: Autumn [name] MRN: [id number] DOB: 1955/08/12 Today's Date: 01/24/2024   History of present illness Pt is a 68 y.o. female who presented 01/17/24 with acute on chronic L hip pain and was admitted for AF with RVR and HFrEF exacerbation. UDS positive for benzodiazepines, amphetamines, and THC. Imaging of L hip showed no acute osseous abnormality. S/p cardioversion 6/11. PMH of afib, drug abuse, parkinson's disease, COPD, ADD, fibromyalgia, HLD, and HFrEF   OT comments  Patient received in supine and agreeable to OT treatment. Patient asking to use [facility] and able to get to EOB with supervision and CGA to transfer to [facility]. Patient able to perform toilet hygiene seated and ambulated to recliner with quad cane and CGA and verbal cues for safety. Patient performed gown and sock change from recliner. Patient stating increased back pain and asking for pain med. Patient positioned in recliner with increased comfort but continued to have complaints of pain. Discharge recommendations continue to be appropriate. Acute OT to continue to follow to address established goals.       If plan is discharge home, recommend the following:  A little help with walking and/or transfers;A little help with bathing/dressing/bathroom   Equipment Recommendations  None recommended by OT    Recommendations for Other Services      Precautions / Restrictions Precautions Precautions: Fall Recall of Precautions/Restrictions: Impaired Precaution/Restrictions Comments: watch SpO2 Restrictions Weight Bearing Restrictions Per Provider Order: No       Mobility Bed Mobility Overal bed mobility: Needs Assistance Bed Mobility: Supine to Sit     Supine to sit: Supervision     General bed mobility comments: no physical assistance needed    Transfers Overall transfer level: Needs assistance Equipment used: Quad cane Transfers: Sit to/from Stand, Bed to  chair/wheelchair/BSC Sit to Stand: Contact guard assist           General transfer comment: transfer from EOB to [facility] with CGA and no cane, ambulated from [facility] to otherside of bed to recliner with CGA and quad cane use     Balance Overall balance assessment: Needs assistance Sitting-balance support: No upper extremity supported, Feet supported Sitting balance-Leahy Scale: Good     Standing balance support: Single extremity supported, Bilateral upper extremity supported, During functional activity, Reliant on assistive device for balance Standing balance-Leahy Scale: Poor Standing balance comment: reliant on quad cane for support                           ADL either performed or assessed with clinical judgement   ADL Overall ADL's : Needs assistance/impaired     Grooming: Wash/dry hands;Wash/dry face;Set up;Sitting           Upper Body Dressing : Set up;Sitting Upper Body Dressing Details (indicate cue type and reason): gown change Lower Body Dressing: Minimal assistance;Sit to/from stand   Toilet Transfer: Contact guard assist;BSC/3in1 (quad can) Statistician Details (indicate cue type and reason): cues for safety and lines Toileting- Clothing Manipulation and Hygiene: Supervision/safety;Sitting/lateral lean Toileting - Clothing Manipulation Details (indicate cue type and reason): performed toilet hygiene seated     Functional mobility during ADLs: Contact guard assist;Cane (quad can) General ADL Comments: perseverates on back pain    Extremity/Trunk Assessment              Vision       Perception     Praxis     Communication Communication Communication: Impaired  Factors Affecting Communication: Reduced clarity of speech   Cognition Arousal: Alert Behavior During Therapy: Flat affect Cognition: Cognition impaired     Awareness: Intellectual awareness intact, Online awareness impaired   Attention impairment (select first level of  impairment): Selective attention Executive functioning impairment (select all impairments): Reasoning, Problem solving OT - Cognition Comments: Complains of back pain with patient impulsive at time                 Following commands: Impaired Following commands impaired: Follows multi-step commands with increased time      Cueing   Cueing Techniques: Verbal cues  Exercises      Shoulder Instructions       General Comments 93% SpO2 on 4 liters    Pertinent Vitals/ Pain       Pain Assessment Pain Assessment: Faces Faces Pain Scale: Hurts even more Pain Location: Back and LEs Pain Descriptors / Indicators: Discomfort, Grimacing, Crying Pain Intervention(s): Limited activity within patient's tolerance, Monitored during session, Repositioned, RN gave pain meds during session  Home Living                                          Prior Functioning/Environment              Frequency  Min 2X/week        Progress Toward Goals  OT Goals(current goals can now be found in the care plan section)  Progress towards OT goals: Progressing toward goals  Acute Rehab OT Goals Patient Stated Goal: to have less back pain OT Goal Formulation: With patient Time For Goal Achievement: 02/03/24 Potential to Achieve Goals: Good ADL Goals Pt Will Perform Grooming: with modified independence;standing Pt Will Perform Lower Body Dressing: with modified independence;sit to/from stand Pt Will Transfer to Toilet: with modified independence;ambulating Pt Will Perform Toileting - Clothing Manipulation and hygiene: with modified independence;sit to/from stand Additional ADL Goal #1: Pt will verbalize at least 3 energy conservation strategies to prevent COPD exacerbation and promote safety upon d/c.  Plan      Co-evaluation                 AM-PAC OT 6 Clicks Daily Activity     Outcome Measure   Help from another person eating meals?: None Help from  another person taking care of personal grooming?: A Little Help from another person toileting, which includes using toliet, bedpan, or urinal?: A Little Help from another person bathing (including washing, rinsing, drying)?: A Little Help from another person to put on and taking off regular upper body clothing?: A Little Help from another person to put on and taking off regular lower body clothing?: A Little 6 Click Score: 19    End of Session Equipment Utilized During Treatment: Gait belt;Other (comment) (quad cane)  OT Visit Diagnosis: Unsteadiness on feet (R26.81);Other abnormalities of gait and mobility (R26.89)   Activity Tolerance Patient limited by pain   Patient Left in chair;with call bell/phone within reach;with chair alarm set   Nurse Communication Mobility status        Time: 0093-8182 OT Time Calculation (min): 25 min  Charges: OT General Charges $OT Visit: 1 Visit OT Treatments $Self Care/Home Management : 23-37 mins  Anitra Barn, OTA Acute Rehabilitation Services  Office [phone]   Jovita Nipper 01/24/2024, 1:39 PM

## 2024-01-24 NOTE — Plan of Care (Signed)

## 2024-01-24 NOTE — Progress Notes (Signed)
 Physical Therapy Treatment Patient Details Name: Autumn [name] MRN: [id number] DOB: 1955-09-22 Today's Date: 01/24/2024   History of Present Illness Pt is a 68 y.o. female who presented 01/17/24 with acute on chronic L hip pain and was admitted for AF with RVR and HFrEF exacerbation. UDS positive for benzodiazepines, amphetamines, and THC. Imaging of L hip showed no acute osseous abnormality. S/p cardioversion 6/11. PMH of afib, drug abuse, parkinson's disease, COPD, ADD, fibromyalgia, HLD, and HFrEF    PT Comments  Pt seen for PT tx with pt agreeable with heavy encouragement/education. Pt with decreased awareness, reporting she already worked with PT today when she had not. Pt is able to ambulate with hurry cane & CGA<>min assist, intermittently reaching for IV pole with L hand for increased stability with PT educating her on the decreased safety of this. Pt agreeable to gait trial with RW after rest break & is able to ambulate with RW & CGA<>supervision with cuing re: positioning in relation to AD. Pt also heavily pushes RW down into the floor. PT educates pt on benefits of RW vs hurry cane & pt agreeable to use RW. Pt would benefit from ongoing PT treatment to address balance, gait with LRAD, endurance, & stair negotiation.    If plan is discharge home, recommend the following: A little help with walking and/or transfers;A little help with bathing/dressing/bathroom;Assistance with cooking/housework;Assist for transportation;Direct supervision/assist for medications management;Direct supervision/assist for financial management;Help with stairs or ramp for entrance   Can travel by private vehicle        Equipment Recommendations  Rolling walker (2 wheels)    Recommendations for Other Services       Precautions / Restrictions Precautions Precautions: Fall Recall of Precautions/Restrictions: Impaired Precaution/Restrictions Comments: watch SpO2 Restrictions Weight Bearing Restrictions Per  Provider Order: No     Mobility  Bed Mobility Overal bed mobility: Needs Assistance Bed Mobility: Supine to Sit     Supine to sit: Supervision, HOB elevated, Used rails (exit R side of bed)          Transfers Overall transfer level: Needs assistance Equipment used: Rolling walker (2 wheels) (hurry cane) Transfers: Sit to/from Stand Sit to Stand: Supervision           General transfer comment: cuing re: hand placement during sit>stand from EOB    Ambulation/Gait Ambulation/Gait assistance: Contact guard assist, Min assist, Supervision Gait Distance (Feet): 75 Feet (+ 55 ft) Assistive device: Rolling walker (2 wheels) (hurry cane) Gait Pattern/deviations: Decreased step length - right, Decreased stride length, Decreased step length - left Gait velocity: decreased     General Gait Details: cuing to ambulate within base of AD vs pushing RW out in front of her   Stairs             Wheelchair Mobility     Tilt Bed    Modified Rankin (Stroke Patients Only)       Balance Overall balance assessment: Needs assistance Sitting-balance support: No upper extremity supported, Feet supported Sitting balance-Leahy Scale: Good     Standing balance support: Single extremity supported, Bilateral upper extremity supported, During functional activity, Reliant on assistive device for balance Standing balance-Leahy Scale: Poor                              Communication Communication Communication: Impaired Factors Affecting Communication: Reduced clarity of speech  Cognition Arousal: Alert Behavior During Therapy: Flat affect   PT -  Cognitive impairments: No family/caregiver present to determine baseline, Sequencing, Problem solving, Safety/Judgement, Initiation                       PT - Cognition Comments: Pt reports she worked with PT already today but had only worked with OT. Did not recall correct name of OT. Requires max  encouragement/cuing for participation. Cuing for safety to use RW vs hold to IV pole with 1 hand & cane with the other. Following commands: Impaired Following commands impaired: Follows multi-step commands with increased time    Cueing Cueing Techniques: Verbal cues  Exercises      General Comments General comments (skin integrity, edema, etc.): Pt on 3L/min via nasal cannula, SpO2 in high 80s but does recover to >/= 90% with rest.      Pertinent Vitals/Pain Pain Assessment Pain Assessment: No/denies pain Faces Pain Scale: No hurt    Home Living                          Prior Function            PT Goals (current goals can now be found in the care plan section) Acute Rehab PT Goals Patient Stated Goal: Have less pain PT Goal Formulation: With patient Time For Goal Achievement: 02/02/24 Potential to Achieve Goals: Fair Progress towards PT goals: Progressing toward goals    Frequency    Min 2X/week      PT Plan      Co-evaluation              AM-PAC PT 6 Clicks Mobility   Outcome Measure  Help needed turning from your back to your side while in a flat bed without using bedrails?: None Help needed moving from lying on your back to sitting on the side of a flat bed without using bedrails?: A Little Help needed moving to and from a bed to a chair (including a wheelchair)?: A Little Help needed standing up from a chair using your arms (e.g., wheelchair or bedside chair)?: A Little Help needed to walk in hospital room?: A Little Help needed climbing 3-5 steps with a railing? : A Lot 6 Click Score: 18    End of Session Equipment Utilized During Treatment: Oxygen  Activity Tolerance: Patient tolerated treatment well;Patient limited by fatigue Patient left: in bed;with call bell/phone within reach;with bed alarm set Nurse Communication: Mobility status PT Visit Diagnosis: Unsteadiness on feet (R26.81);Other abnormalities of gait and mobility  (R26.89);Muscle weakness (generalized) (M62.81);Difficulty in walking, not elsewhere classified (R26.2)     Time: 1430-1450 PT Time Calculation (min) (ACUTE ONLY): 20 min  Charges:    $Therapeutic Activity: 8-22 mins PT General Charges $$ ACUTE PT VISIT: 1 Visit                     [name], PT, DPT 01/24/24, 2:57 PM   Venetta Gill 01/24/2024, 2:56 PM

## 2024-01-24 NOTE — TOC Progression Note (Signed)
 Transition of Care [facility]) - Progression Note    Patient Details  Name: Autumn [name] MRN: [id number] Date of Birth: 21-Dec-1955  [facility] CM/SW Contact  Ernst Heap Phone Number: [phone] 01/24/2024, 12:13 PM  Clinical Narrative:   HF CSW/NCM will continue to follow and monitor for patients dc needs.   TOC will continue following.     Expected Discharge Plan: Home/Self Care Barriers to Discharge: Continued Medical Work up  Expected Discharge Plan and Services       Living arrangements for the past 2 months: Single Family Home                                       Social Determinants of Health (SDOH) Interventions SDOH Screenings   Food Insecurity: No Food Insecurity (01/18/2024)  Housing: Low Risk  (01/18/2024)  Transportation Needs: No Transportation Needs (01/18/2024)  Utilities: Not At Risk (01/18/2024)  Alcohol Screen: Low Risk  (06/02/2023)  Financial Resource Strain: Low Risk  (08/19/2023)   Received from [facility]  Physical Activity: Unknown (08/19/2023)   Received from [facility]  Social Connections: Unknown (01/18/2024)  Stress: No Stress Concern Present (08/19/2023)   Received from [facility]  Tobacco Use: High Risk (01/17/2024)    Readmission Risk Interventions    01/21/2023    1:29 PM 12/07/2022    1:27 PM  Readmission Risk Prevention Plan  Transportation Screening Complete Complete  PCP or Specialist Appt within 3-5 Days  Complete  HRI or Home Care Consult  Complete  Social Work Consult for Recovery Care Planning/Counseling  Complete  Palliative Care Screening  Not Applicable  Medication Review Ocean[employer]) Complete Complete  PCP or Specialist appointment within 3-5 days of discharge Complete   HRI or Home Care Consult Complete   SW Recovery Care/Counseling Consult Complete   Palliative Care Screening Not Applicable   Skilled Nursing Facility Not Applicable

## 2024-01-24 NOTE — Progress Notes (Addendum)
 Patient ID: Autumn [name], female   DOB: 1955-08-22, 68 y.o.   MRN: [id number]     Cardiology Rounding Note  Cardiologist: Jerryl Morin, DO  Chief Complaint: CHF  Subjective:    6/11: TEE-guided DCCV, she went back into NSR on 2nd shock. TEE showed LV EF 30-35%, moderate LV dilation, mild RV dysfunction, moderate TR, severe MR with restriction of the posterior leaflet and malcoaptation.   Diuretics stopped 06/15 d/t rising Scr and hypotension. Received 500 cc bolus LR.  Scr up slightly to 1.87 but appears to be reaching plateau.  Back in Afib yesterday afternoon, rate 100s-110s.  No dyspnea at rest. Having terrible back pain.   Objective:    Weight Range: 84.2 kg Body mass index is 31.86 kg/m.   Vital Signs:   Temp:  [97.6 F (36.4 C)-98.3 F (36.8 C)] 98.3 F (36.8 C) (06/16 0410) Pulse Rate:  [71-109] 109 (06/16 0410) Resp:  [16-18] 18 (06/16 0410) BP: (79-113)/(46-78) 113/57 (06/16 0410) SpO2:  [90 %-100 %] 93 % (06/16 0410) Weight:  [84.2 kg] 84.2 kg (06/16 0410) Last BM Date : 01/20/24  Weight change: Filed Weights   01/22/24 0635 01/23/24 0500 01/24/24 0410  Weight: 83.2 kg 81 kg 84.2 kg   Intake/Output:  Intake/Output Summary (Last 24 hours) at 01/24/2024 0715 Last data filed at 01/24/2024 0430 Gross per 24 hour  Intake 988.82 ml  Output --  Net 988.82 ml    Physical Exam    General:  Chronically ill appearing.  Neck: JVP 7-8 cm Cor: Irregular rhythm, tachy. No rubs, gallops or murmurs. Lungs: clear Abdomen: soft, nontender, nondistended.  Extremities: trace to 1+ edema Neuro: alert & orientedx3. Affect flat   Telemetry   Atrial fib 100s-110s  Labs    CBC No results for input(s): WBC, NEUTROABS, HGB, HCT, MCV, PLT in the last 72 hours.  Basic Metabolic Panel Recent Labs    40/98/11 1805 01/22/24 1025 01/23/24 0515 01/24/24 0422  NA 133*   < > 132* 129*  K 4.4   < > 4.6 3.9  CL 90*   < > 86* 84*  CO2 31   < > 27 31   GLUCOSE 122*   < > 139* 127*  BUN 24*   < > 44* 55*  CREATININE 0.97   < > 1.80* 1.87*  CALCIUM  9.5   < > 9.8 9.2  MG  --    < > 2.3 2.2  PHOS 5.2*  --   --   --    < > = values in this interval not displayed.   BNP (last 3 results) Recent Labs    07/02/23 1153 08/06/23 1802 01/17/24 1034  BNP 411.5* 238.2* 826.3*   Imaging   No results found.   Medications:    Scheduled Medications:  acetaminophen   650 mg Oral QID   amiodarone   200 mg Oral BID   atorvastatin   80 mg Oral Daily   DULoxetine   60 mg Oral Daily   lidocaine   1 patch Transdermal Q24H   metoprolol  succinate  25 mg Oral Daily   Muscle Rub   Topical Daily   nicotine   21 mg Transdermal Daily   rivaroxaban   15 mg Oral Q supper   Infusions:    PRN Medications: gabapentin , hydrOXYzine , ipratropium-albuterol , methocarbamol , mouth rinse, polyethylene glycol, traMADol, trimethobenzamide  Assessment/Plan   1. Acute on chronic systolic CHF: Echo in 6/22 with EF 60-65%.  Had multiple echoes in 2024, each with lower EF.  Echo in 10/24 with EF 30-35%, moderate-severe MR.  She has a history of CAD but denies recent chest pain.  Cath 12/24 showed significant CAD but no critical lesion.  TEE 12/24 with EF 25-30%, moderate RV dysfunction, moderate MR. 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  TEE yesterday showed EF 30-35%, moderate LV dilation, mild RV dysfunction, moderate TR, severe mitral regurgitation with restriction of the posterior leaflet and malcoaptation. She required milrinone  with diuresis in 12/24.  She is readmitted in AF/RVR (last ECG in 1/25 also showed AF) off all her medications with significant volume overload on exam.  - Diuretics have been on hold with AKI and hypotension. Received 500 cc bolus LR on 06/15.  - Volume starting to creep back up. Start Torsemide  60 mg daily (confirmed this is the dose she was taking at home). - Holding digoxin ,  entresto , farxiga  and spiro with AKI/hypotension. BP improving. Add back GDMT slowly tomorrow. - She is not a candidate for advanced therapies with active substance abuse and noncompliance.   2. Atrial fibrillation: Persistent, multiple admissions with AF/RVR.  Cardioverted to NSR in 12/24 but stopped amiodarone  and Xarelto  when she went home and went back into AF.  She was in AF in 1/25, no ECGs after that until this admission when she was in AF with RVR.  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  She does not tolerate AF well. She had TEE-guided DCCV 6/11 and is back in NSR.  - Back in Afib 100s-110s yesterday afternoon. Will load with IV amiodarone  for another couple of days. Plan DCCV on 06/18 if does not convert before then. - Continue low dose Toprol  XL 25 daily.  Holding digoxin  for now given AKI - Continue Xarelto   - Would favor eventual AF ablation.   3. 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 Moderate on TEE 12/24.  Severe mitral regurgitation on TEE 6/11 with restricted posterior leaflet and malcoaptation.  - Repeat echo 6/13 shows significant improvement in MR since she has been cardioverted and diuresed, now mild to moderate  4. CAD: DES to LAD in 9/20.  No recent chest pain. LHC 12/24 showed patent LAD stent, 75% mid LCx stenosis and heavily calcified 60% proximal RCA stenosis. Managed medically given no critical lesion and no chest pain. Troponin minimally elevated with no trend.  - Continue atorvastatin .  - No ASA with Xarelto  use.   5. COPD: She smokes and uses home oxygen  at times.  - Needs to quit smoking.   6. Sciatica: Significant chronic pain.  Per [facility].   7. OSA: Suspected.  - Will need sleep study.   8. PFO: Small, noted on TEE.   9. Substance abuse: Cocaine in past though UDS did not show cocaine this admission. 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  Self-medicating for depression and back  pain.  - Prev treated with Suboxone . Cymbalta  per primary.    10. AKI: Worsening renal function (0.97 > 1.21 > 1.80>1.87), suspect overdiuresis.  Diuretics and GDMT stopped. Given IVF 06/15 - Scr up further today to 1.87 but appears to be reaching plateau. Volume starting to creep up. Restart Torsemide  but at lower dose, 60 mg daily.  Length of Stay: 6  FINCH, LINDSAY N, PA-C  01/24/2024, 7:15 AM  Patient seen and examined with the above-signed Advanced Practice Provider and/or Housestaff. I personally reviewed laboratory data, imaging studies and relevant notes. I independently examined the patient  and formulated the important aspects of the plan. I have edited the note to reflect any of my changes or salient points. I have personally discussed the plan with the patient and/or family.  Complaining of back pain. Denies CP or SOB  Back in AF with RVR. Scr seems to have plateaued.   General:  Chronically ill appearing. No resp difficulty HEENT: normal Neck: supple. JVP 7. Carotids 2+ bilat; no bruits. No lymphadenopathy or thryomegaly appreciated. YQM:VHQIO tachy Lungs: coarse Abdomen: soft, nontender, nondistended. No hepatosplenomegaly. No bruits or masses. Good bowel sounds. Extremities: no cyanosis, clubbing, rash, 1+ edema Neuro: alert & orientedx3, cranial nerves grossly intact. moves all 4 extremities w/o difficulty. Affect pleasant   She is back in AF with RVR. Restart IV amio. Plan repeat DC-CV on Wednesday.   Volume status creeping back up. Will restart torsemide  60 daily. Watch Scr in setting or recent AKI.   Continue Xarelto   Beverley Sherrard, MD  8:56 AM

## 2024-01-25 ENCOUNTER — Encounter (HOSPITAL_COMMUNITY): Payer: Self-pay

## 2024-01-25 DIAGNOSIS — I4819 Other persistent atrial fibrillation: Secondary | ICD-10-CM | POA: Diagnosis not present

## 2024-01-25 DIAGNOSIS — I5023 Acute on chronic systolic (congestive) heart failure: Secondary | ICD-10-CM | POA: Diagnosis not present

## 2024-01-25 DIAGNOSIS — I4891 Unspecified atrial fibrillation: Secondary | ICD-10-CM | POA: Diagnosis not present

## 2024-01-25 DIAGNOSIS — I340 Nonrheumatic mitral (valve) insufficiency: Secondary | ICD-10-CM | POA: Diagnosis not present

## 2024-01-25 DIAGNOSIS — I959 Hypotension, unspecified: Secondary | ICD-10-CM | POA: Diagnosis not present

## 2024-01-25 LAB — BASIC METABOLIC PANEL WITH GFR
Anion gap: 14 (ref 5–15)
BUN: 51 mg/dL — ABNORMAL HIGH (ref 8–23)
CO2: 28 mmol/L (ref 22–32)
Calcium: 9.2 mg/dL (ref 8.9–10.3)
Chloride: 87 mmol/L — ABNORMAL LOW (ref 98–111)
Creatinine, Ser: 1.41 mg/dL — ABNORMAL HIGH (ref 0.44–1.00)
GFR, Estimated: 41 mL/min — ABNORMAL LOW (ref >60)
Glucose, Bld: 118 mg/dL — ABNORMAL HIGH (ref 70–99)
Potassium: 3.3 mmol/L — ABNORMAL LOW (ref 3.5–5.1)
Sodium: 129 mmol/L — ABNORMAL LOW (ref 135–145)

## 2024-01-25 LAB — MAGNESIUM: Magnesium: 1.9 mg/dL (ref 1.7–2.4)

## 2024-01-25 LAB — C-REACTIVE PROTEIN: CRP: 0.6 mg/dL (ref <1.0)

## 2024-01-25 LAB — SEDIMENTATION RATE: Sed Rate: 16 mm/h (ref 0–22)

## 2024-01-25 MED ORDER — SPIRONOLACTONE 12.5 MG HALF TABLET
12.5000 mg | ORAL_TABLET | Freq: Every day | ORAL | Status: DC
  Administered 2024-01-25 – 2024-01-26 (×2): 12.5 mg via ORAL
  Filled 2024-01-25 (×2): qty 1

## 2024-01-25 MED ORDER — MUSCLE RUB 10-15 % EX CREA
TOPICAL_CREAM | Freq: Three times a day (TID) | CUTANEOUS | Status: DC
  Administered 2024-01-26: 1 via TOPICAL
  Filled 2024-01-25 (×2): qty 85

## 2024-01-25 MED ORDER — SODIUM CHLORIDE 0.9% FLUSH: 3.0000 mL | INTRAVENOUS | Status: DC | PRN

## 2024-01-25 MED ORDER — MAGNESIUM SULFATE 2 GM/50ML IV SOLN
2.0000 g | Freq: Once | INTRAVENOUS | Status: AC
  Administered 2024-01-25: 2 g via INTRAVENOUS
  Filled 2024-01-25: qty 50

## 2024-01-25 MED ORDER — SODIUM CHLORIDE 0.9% FLUSH
3.0000 mL | Freq: Two times a day (BID) | INTRAVENOUS | Status: DC
  Administered 2024-01-25 – 2024-01-26 (×2): 3 mL via INTRAVENOUS
  Administered 2024-01-26 – 2024-01-27 (×2): 10 mL via INTRAVENOUS

## 2024-01-25 MED ORDER — POTASSIUM CHLORIDE 20 MEQ PO PACK
40.0000 meq | PACK | Freq: Two times a day (BID) | ORAL | Status: AC
  Administered 2024-01-25 (×2): 40 meq via ORAL
  Filled 2024-01-25 (×2): qty 2

## 2024-01-25 MED ORDER — ACETAMINOPHEN 500 MG PO TABS
1000.0000 mg | ORAL_TABLET | Freq: Four times a day (QID) | ORAL | Status: DC
  Administered 2024-01-25 – 2024-01-29 (×13): 1000 mg via ORAL
  Filled 2024-01-25 (×13): qty 2

## 2024-01-25 MED ORDER — RIVAROXABAN 20 MG PO TABS
20.0000 mg | ORAL_TABLET | Freq: Every day | ORAL | Status: DC
  Administered 2024-01-25: 20 mg via ORAL
  Filled 2024-01-25: qty 1

## 2024-01-25 NOTE — Progress Notes (Addendum)
 Patient ID: Autumn [name], female   DOB: 1955/11/14, 68 y.o.   MRN: [id number]     Cardiology Rounding Note  Cardiologist: Jerryl Morin, DO  Chief Complaint: CHF  Subjective:    6/11: TEE-guided DCCV, she went back into NSR on 2nd shock. TEE showed LV EF 30-35%, moderate LV dilation, mild RV dysfunction, moderate TR, severe MR with restriction of the posterior leaflet and malcoaptation.  6/15: Diuretics stopped d/t rising Scr and hypotension. Received 500 cc bolus LR.   Scr 1.41. Diuretics restarted yesterday (6/16)  Back in Afib 6/15, rate 100s-110s. Appears to be back in NSR this morning.   Drowsy this morning. Getting PIV placed. Denies CP/SOB.   Objective:    Weight Range: 83.6 kg Body mass index is 31.64 kg/m.   Vital Signs:   Temp:  [98 F (36.7 C)-98.6 F (37 C)] 98 F (36.7 C) (06/17 0426) Pulse Rate:  [47-97] 51 (06/17 0426) Resp:  [18-20] 20 (06/17 0426) BP: (78-128)/(34-86) 128/86 (06/17 0426) SpO2:  [93 %-98 %] 93 % (06/17 0426) Weight:  [83.6 kg] 83.6 kg (06/17 0500) Last BM Date : 01/20/24  Weight change: Filed Weights   01/23/24 0500 01/24/24 0410 01/25/24 0500  Weight: 81 kg 84.2 kg 83.6 kg   Intake/Output:  Intake/Output Summary (Last 24 hours) at 01/25/2024 0737 Last data filed at 01/25/2024 0319 Gross per 24 hour  Intake 1180.86 ml  Output 1000 ml  Net 180.86 ml    Physical Exam    General:  elderly appearing.  No respiratory difficulty Neck: supple. JVD ~7 cm.  Cor: PMI nondisplaced. Regular rate & rhythm. No rubs, gallops or murmurs. Lungs: clear Extremities: no cyanosis, clubbing, rash, trace BLE edema  Neuro: alert & oriented x 3. Moves all 4 extremities w/o difficulty. Affect flat.   Telemetry   NSR 70s (Personally reviewed)    Labs    CBC No results for input(s): WBC, NEUTROABS, HGB, HCT, MCV, PLT in the last 72 hours.  Basic Metabolic Panel Recent Labs    40/98/11 0422 01/25/24 0500  NA 129* 129*  K 3.9 3.3*   CL 84* 87*  CO2 31 28  GLUCOSE 127* 118*  BUN 55* 51*  CREATININE 1.87* 1.41*  CALCIUM  9.2 9.2  MG 2.2 1.9   BNP (last 3 results) Recent Labs    07/02/23 1153 08/06/23 1802 01/17/24 1034  BNP 411.5* 238.2* 826.3*   Imaging   MR LUMBAR SPINE WO CONTRAST Result Date: 01/25/2024 CLINICAL DATA:  68 year old female with lumbar radiculopathy. EXAM: MRI LUMBAR SPINE WITHOUT CONTRAST TECHNIQUE: Multiplanar, multisequence MR imaging of the lumbar spine was performed. No intravenous contrast was administered. COMPARISON:  Lumbar radiographs 08/06/2023. FINDINGS: Segmentation:  Normal on the comparison. Alignment: Stable mild dextroconvex lower lumbar scoliosis (apex L3-L4 previously) and associated straightening of lumbar lordosis at those levels. Vertebrae: Confluent abnormal bone marrow signal in both the L3 and L4 vertebrae, with vertebral body and bilateral posterior element marrow edema. Abnormal signal in the intervening L3-L4 disc space. And posterior element joint effusion also. The edema is asymmetric to the left. But there is little if any surrounding soft tissue edema at that level (mild in the medial left psoas muscle series 6, image 15, with some underlying psoas atrophy there). See additional details of those levels below. Background bone marrow signal is normal. Chronic degenerative endplate marrow signal changes also at L4-L5. Relatively maintained vertebral height. No other marrow edema or acute osseous abnormality. Conus medullaris and cauda equina: Conus  extends to the L1 level. No lower spinal cord or conus signal abnormality. Paraspinal and other soft tissues: Negative except for faint marrow edema in the medial left psoas muscle at the L3-L4 level as described above. No paraspinal fluid collection. Negative visible abdominal viscera. Disc levels: Axial images are intermittently degraded by motion despite repeated imaging attempts. T10-T11 and T11-T12: Disc desiccation, disc space  loss, circumferential disc bulge. Mild to moderate facet hypertrophy at each level. No significant spinal stenosis. Mild to moderate T10 neural foraminal stenosis greater on the right. T12-L1:  Negative. L1-L2: Disc desiccation, mild disc space loss, and circumferential disc bulge. Mild facet hypertrophy. No significant stenosis. L2-L3: Disc desiccation, disc space loss, circumferential disc bulge. Broad-based bilateral foraminal disc and moderate facet, mild ligament flavum hypertrophy. Mild subsequent spinal stenosis (series 8, image 20 and series 5, image 10). Mild left and mild-to-moderate right L2 neural foraminal stenosis (series 8, image 18). L3-L4: Severe disc space loss. Vacuum disc here on the December radiographs. Bulky circumferential disc osteophyte complex with a broad-based posterior component. Posterior annular fissure of the disc. Severe facet and ligament flavum hypertrophy. Degenerative appearing facet joint fluid, and an 8-9 mm left-side synovial cyst projecting superiorly from that facet (series 8, image 23 and series 5, image 13). Subsequent severe spinal stenosis (series 8, image 25) with symmetric lateral recess involvement (L4 nerve levels). Severe left L3 neural foraminal stenosis. Mild to moderate right L3 foraminal stenosis. L4-L5: Severe disc space loss. Vacuum disc here in December. Bulky circumferential disc osteophyte complex. Moderate facet and ligament flavum hypertrophy. Moderate to severe spinal stenosis. Lateral recess stenosis here might be greater on the right (descending right L5 nerve level). Moderate to severe bilateral L4 neural foraminal stenosis appears greater on the left. L5-S1: Mild disc bulging and endplate spurring. Mild facet hypertrophy. No stenosis. IMPRESSION: 1. Mildly motion degraded exam but advanced chronic lumbar disc and posterior element degeneration at L3-L4 and L4-L5 with Severe multifactorial spinal stenosis at Both levels. However, L3-L4 demonstrates  confluent vertebral edema associated and conspicuous heterogeneous signal within the disc space there. Discitis Osteomyelitis is not excluded at L3-L4, although no significant paraspinal soft tissue inflammation at this time. Recommend Blood Cultures. And if the clinical course is equivocal a repeat Lumbar MRI without and with contrast in 10-14 days may be most valuable. 2. Associated severe neural foraminal stenosis at the left L3 and left greater than right L4 nerve levels. 3. Mild multifactorial spinal stenosis at L2-L3. Mild to moderate bilateral L2 neural foraminal stenosis. Electronically Signed   By: Marlise Simpers M.D.   On: 01/25/2024 04:30     Medications:    Scheduled Medications:  acetaminophen   650 mg Oral QID   atorvastatin   80 mg Oral Daily   DULoxetine   60 mg Oral Daily   feeding supplement  237 mL Oral BID BM   lidocaine   1 patch Transdermal Q24H   metoprolol  succinate  25 mg Oral Daily   Muscle Rub   Topical Daily   nicotine   21 mg Transdermal Daily   potassium chloride   40 mEq Oral BID   pregabalin   75 mg Oral BID   rivaroxaban   15 mg Oral Q supper   sodium chloride  flush  3-10 mL Intravenous Q12H   torsemide   60 mg Oral Daily   Infusions:  amiodarone  30 mg/hr (01/25/24 0319)   magnesium  sulfate bolus IVPB       PRN Medications: hydrOXYzine , ipratropium-albuterol , methocarbamol , mouth rinse, polyethylene glycol, sodium chloride  flush,  traMADol, trimethobenzamide  Assessment/Plan   1. Acute on chronic systolic CHF: Echo in 6/22 with EF 60-65%.  Had multiple echoes in 2024, each with lower EF.  Echo in 10/24 with EF 30-35%, moderate-severe MR.  She has a history of CAD but denies recent chest pain.  Cath 12/24 showed significant CAD but no critical lesion.  TEE 12/24 with EF 25-30%, moderate RV dysfunction, moderate MR. 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  TEE yesterday showed EF 30-35%, moderate LV dilation,  mild RV dysfunction, moderate TR, severe mitral regurgitation with restriction of the posterior leaflet and malcoaptation. She required milrinone  with diuresis in 12/24.  She is readmitted in AF/RVR (last ECG in 1/25 also showed AF) off all her medications with significant volume overload on exam.  - Diuretics previously on hold with AKI and hypotension. Received 500 cc bolus LR on 06/15.  - Torsemide  restarted 6/16: 60 mg daily. Responding well.  - Holding digoxin , entresto  and farxiga  with AKI/hypotension. BP improving. Add back GDMT slowly. - Restart spiro 12.5 mg daily - She is not a candidate for advanced therapies with active substance abuse and noncompliance.   2. Atrial fibrillation: Persistent, multiple admissions with AF/RVR.  Cardioverted to NSR in 12/24 but stopped amiodarone  and Xarelto  when she went home and went back into AF.  She was in AF in 1/25, no ECGs after that until this admission when she was in AF with RVR.  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  She does not tolerate AF well. She had TEE-guided DCCV 6/11 and is back in NSR.  - Back in Afib 100s-110s 6/15 afternoon. Will load with IV amiodarone  for another couple of days. Planned DCCV on 06/18 if she does not hold NSR. Back in NSR this morning.  - Continue low dose Toprol  XL 25 daily.  Holding digoxin  for now given AKI - Continue Xarelto   - Would favor eventual AF ablation.   3. 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 Moderate on TEE 12/24.  Severe mitral regurgitation on TEE 6/11 with restricted posterior leaflet and malcoaptation.  - Repeat echo 6/13 shows significant improvement in MR since she has been cardioverted and diuresed, now mild to moderate  4. CAD: DES to LAD in 9/20.  No recent chest pain. LHC 12/24 showed patent LAD stent, 75% mid LCx stenosis and heavily calcified 60% proximal RCA stenosis. Managed medically given no critical lesion and no chest pain. Troponin  minimally elevated with no trend.  - Continue atorvastatin .  - No ASA with Xarelto  use.   5. COPD: She smokes and uses home oxygen  at times.  - Needs to quit smoking.   6. Sciatica: Significant chronic pain.  Per [facility].   7. OSA: Suspected.  - Will need sleep study.   8. PFO: Small, noted on TEE.   9. Substance abuse: Cocaine in past though UDS did not show cocaine this admission. 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  Self-medicating for depression and back pain.  - Prev treated with Suboxone . Cymbalta  per primary.    10. AKI: Worsening renal function (0.97 > 1.21 > 1.80>1.87), suspect from overdiuresis.  Diuretics and GDMT held. Given IVF 06/15 - Scr now down trending and diuretics restarted yesterday. 1.41 today. Restart spiro as above  Length of Stay: 7  Sheryl Donna, NP  01/25/2024, 7:37 AM  Patient seen and examined with the above-signed Advanced Practice Provider and/or Housestaff.  I personally reviewed laboratory data, imaging studies and relevant notes. I independently examined the patient and formulated the important aspects of the plan. I have edited the note to reflect any of my changes or salient points. I have personally discussed the plan with the patient and/or family.  Still having back pain. MRI concerning for diskitis.   Back in sinus on IV amio.   Oral torsemide  restarted yesterday. Weight down 1 pound. Scr improved  General:  Chronically ill appearing. No resp difficulty HEENT: normal Neck: supple. JVP 6-7. Carotids 2+ bilat; no bruits. No lymphadenopathy or thryomegaly appreciated. Cor: PMI nondisplaced. Regular rate & rhythm. No rubs, gallops or murmurs. Lungs: clear Abdomen: soft, nontender, nondistended. No hepatosplenomegaly. No bruits or masses. Good bowel sounds. Extremities: no cyanosis, clubbing, rash, edema Neuro: alert & orientedx3, cranial nerves grossly intact. moves all 4 extremities w/o difficulty. Affect  pleasant  Back in sinus on IV amio. Would continue IV load one more day. Tolerating torsemide . Agree with restarting spiro.   Continue Xarelto   Barbar Brede, MD  8:32 AM

## 2024-01-25 NOTE — Progress Notes (Signed)
 HD#7 SUBJECTIVE:  Patient Summary: Autumn [name] is a 68 y.o. with a pertinent PMH of HFrEF, A-fib on Xarelto , attention deficit disorder, COPD, chronic hypoxic respiratory failure on 2 L [place], anxiety and depression, fibromyalgia, HLD, and CAD , who presented with acute on chronic left hip pain and admitted for A-fib with RVR and HFrEF exacerbation.   Overnight Events and Interim History: NEO. This morning complains of back pain. No CP/dyspnea/palpitations. Feels her swelling of the LE are improved.   Significant Hospital Events:  6/11 DCCV/TEE 6/15 back to Afib, not RVR 6/17 chemical cardioversion per amiodarone   OBJECTIVE:  Vital Signs: Vitals:   01/25/24 0426 01/25/24 0500 01/25/24 0842 01/25/24 1205  BP: 128/86  91/67 (!) 100/52  Pulse: (!) 51     Resp: 20  20 20   Temp: 98 F (36.7 C)  97.6 F (36.4 C) 97.6 F (36.4 C)  TempSrc: Oral  Oral Oral  SpO2: 93%     Weight:  83.6 kg    Height:       Supplemental O2: Nasal Cannula SpO2: 93 % O2 Flow Rate (L/min): 4 L/min  Filed Weights   01/23/24 0500 01/24/24 0410 01/25/24 0500  Weight: 81 kg 84.2 kg 83.6 kg     Intake/Output Summary (Last 24 hours) at 01/25/2024 1235 Last data filed at 01/25/2024 0319 Gross per 24 hour  Intake 940.86 ml  Output 300 ml  Net 640.86 ml   Net IO Since Admission: -5,621.47 mL [01/25/24 1235]  Physical Exam: Physical Exam Constitutional:      General: She is not in acute distress.    Appearance: She is obese. She is not ill-appearing.     Comments: Restful in bed HENT:     Mouth/Throat:     Mouth: Mucous membranes are moist.  Cardiovascular:     Rate and Rhythm: Regular.     Pulses: Normal pulses.  Pulmonary:     Effort: Pulmonary effort is normal.     Breath sounds: No wheezing or rales.  Abdominal:     General: Abdomen is flat.     Tenderness: There is no abdominal tenderness.  Musculoskeletal:        General: No tenderness.     Right lower leg: Trace edema present. Red  discolored healing lesion on shin she attributes to a fall in April.    Left lower leg: No edema.  Diffuse tenderness in L lumbar paraspinal and midline back, no skin changes, ropey muscle tightness on L side. ROM limited by pain. Hamstrings tight bilaterall  Neurological:     General: No focal deficit present.     Mental Status: She is alert and oriented to person, place, and time.   Psychiatric:        Mood and Affect: Mood normal.     Comments: Relaxed today  Patient Lines/Drains/Airways Status     Active Line/Drains/Airways     Name Placement date Placement time Site Days   Peripheral IV 01/19/24 22 G Anterior;Right Forearm 01/19/24  0822  Forearm  6   Peripheral IV 01/25/24 22 G 1 Anterior;Left Forearm 01/25/24  0800  Forearm  less than 1             ASSESSMENT/PLAN:  Assessment: Principal Problem:   Atrial fibrillation with RVR (HCC) Active Problems:   Polysubstance abuse (HCC)   Tobacco dependence   COPD (chronic obstructive pulmonary disease) (HCC)   Persistent atrial fibrillation (HCC)   CAD (coronary artery disease) S/P stent to  LAD   Acute on chronic systolic CHF (congestive heart failure) (HCC)   Left hip pain   Anxiety state   AKI (acute kidney injury) (HCC)  Autumn [name] is a 68 y.o. with a pertinent PMH of HFrEF, A-fib on Xarelto , attention deficit disorder, COPD, chronic hypoxic respiratory failure on 2 L [place], anxiety and depression, fibromyalgia, HLD, and CAD , who presented with acute on chronic left hip pain and admitted for A-fib with RVR and HFrEF exacerbation.   Plan: Acute HFrEF with EF 25-30% improved to 35-40% Volume overload Severe Mitral Regurgitation Hx COPD, Chronic hypoxic respiratory failure on 2L O2 AKI Cardiology consulting, thank you. Fluctuates room air to 4L, no recorded hypoxia and no dyspnea. Cr improving 1.8 -> 1.4. On oral diuresis with 1L UOP yesterday. Total 11L diuresis this hospitalization. Weight down 1kg since  yesterday.  - Resume oral diuresis torsemide  60 daily. Watch Cr. PTH regimen was BID. - Cont metoprolol  succinate 25mg  daily   - Restart spiro 12.5 daily   For now given recent hypotension, will hold but anticipate resuming: - GDMT Entresto , Farxiga  - Dixogin 0.125 daily   - Strict ins and outs, daily weights. - DuoNebs every 6 as needed - Recs per cardiology   Atrial Fibrillation with RVR s/p Cardioversion 6/11 now NSR Cardiology consulting, thank you. NSR today from amiodarone  infusion, which will continue, and still consider cardioversion tomorrow if NSR does not hold. Overall, suspect tachyarrhythmia drives her cardiomyopathy - and med adherence out of hospital is poor - favor eventual AF ablation. - Continue home anticoagulation with Xarelto  20 daily - Continue toprol  XL 25 daily - Amiodarone  bolus/infusion - Recs per cardiology   L hip pain, sciatic pain MRI with concern of discitis osteomyelitis L3/L4 Pain comes and goes, more severe lately, and impairs ambulation. MRI yesterday with severe degenerative disease but also a concern of vertebral osteomyelitis - notes a limited study but unable to exclude - and recs blood cultures and repeat. Clinically low suspicion for acute osteomyelitis and I doubt bacteremia, but it would explain her report of worsened pain without apparent cause. - CBC w/ diff, ERP, CRP, collect blood cultures but hold antibiotics - Avoid narcotic pain medicine - Tylenol  650 TID - Cont lyrica  and Cymbalta  per below - Lidocaine  patch, heating pad prn - Adding Robaxin  750mg  - Working with PT/OT - rec HHPT   GAD/MDD Report of hallucination Anxiety waxes and wanes daily, but severe and tearful at times. Usually redirectable. Shares that she takes unprescribed Xanax  due to this. Feels that most treatment per doctors in past not helpful. Prescribed aripiprazole  in past but not filled since 2024. Questionable med adherence.  I will monitor this report of a  hallucination, she is always appropriately oriented with me making me doubt delirium. If recurrent, would consider a psych consult. - Cont cymbalta  60mg ,  Hope this will have added benefit to help sciatica. - Lyrica  75 BID.  - Hydroxyzine  TID prn    Substance use disorder Resolved Hypersomnolence from substance use Remains AOx3. Suspect admission hypersomnolence was a drug effect. On suboxone  in past. - Avoid sedating medications including narcotic pain medicine  - Rec abstinence / TOC for resources  OSA COPD on 2L at baseline not in exacerbation Diagnosed but does not wear CPAP. - rec outpatient pulm eval CAD Atorvastatin  80 daily. No ASA as on Xarelto . Medication noncompliance Poor adherence out of hospital.  She does not give a clear reason why.  She cannot confirm her medication  list.  Control of cardiovascular/pain/mood disease difficult due to this. Cigarette smoker 1 ppd. Will give a nicotine  patch. She should quit.  Best Practice: Diet: Cardiac diet IVF: none VTE: DOAC Code: Full Therapy Recs: HHPT DISPO: Anticipated discharge in 1-5 days to Home pending HF decongesion, Afib therapy.  Signature: Carleen Chary, D.O.  Internal Medicine Resident, PGY-1 [facility]  Pager: # [phone]. 12:35 PM, 01/25/2024

## 2024-01-25 NOTE — Progress Notes (Signed)
 Physical Therapy Treatment Patient Details Name: Autumn [name] MRN: [id number] DOB: Jan 01, 1956 Today's Date: 01/25/2024   History of Present Illness Pt is a 68 y.o. female who presented 01/17/24 with acute on chronic L hip pain and was admitted for AF with RVR and HFrEF exacerbation. UDS positive for benzodiazepines, amphetamines, and THC. Imaging of L hip showed no acute osseous abnormality. S/p cardioversion 6/11. PMH of afib, drug abuse, parkinson's disease, COPD, ADD, fibromyalgia, HLD, and HFrEF    PT Comments  Pt has had difficulty meeting her goals due to limitations of back and hip pain.  Today, pt thought that the newly introduced Lyrica  might be the  ticket, pt completed warm up exercise in supine and transitioned to sitting EOB without the the usual debilitating pain that has limited therapy lately.  Pt then completed 7 reps of sit to stand to sit activity before needing to get to the bathroom.  Prior to ambulation to the bathroom, pt had no pain.  During amb to the bathroom the pain came on at 6/10 level and did not diminish throughout the rest of the therapy session.  The session was limited by that pain.    If plan is discharge home, recommend the following: A little help with walking and/or transfers;A little help with bathing/dressing/bathroom;Assistance with cooking/housework;Assist for transportation;Direct supervision/assist for medications management;Direct supervision/assist for financial management;Help with stairs or ramp for entrance   Can travel by private vehicle        Equipment Recommendations  Rolling walker (2 wheels)    Recommendations for Other Services       Precautions / Restrictions Precautions Precautions: Fall Recall of Precautions/Restrictions: Impaired Precaution/Restrictions Comments: watch SpO2     Mobility  Bed Mobility Overal bed mobility: Needs Assistance Bed Mobility: Supine to Sit, Sit to Supine     Supine to sit: Supervision, Used  rails (lower HOB) Sit to supine: Supervision        Transfers Overall transfer level: Needs assistance Equipment used: Rolling walker (2 wheels), None (iv pole) Transfers: Sit to/from Stand Sit to Stand: Supervision (x6 as a strengthening exercise)           General transfer comment: cues for hand placement    Ambulation/Gait Ambulation/Gait assistance: Contact guard assist Gait Distance (Feet): 15 Feet (to from bathroom with stop at sink) Assistive device: Rolling walker (2 wheels), None (return without full use of the RW) Gait Pattern/deviations: Step-through pattern, Decreased stride length   Gait velocity interpretation: <1.8 ft/sec, indicate of risk for recurrent falls   General Gait Details: cues for safe proximity to the RW and attaining good posture   Stairs             Wheelchair Mobility     Tilt Bed    Modified Rankin (Stroke Patients Only)       Balance     Sitting balance-Leahy Scale: Good       Standing balance-Leahy Scale: Poor Standing balance comment: reliant on AD or support                            Communication    Cognition Arousal: Alert Behavior During Therapy: WFL for tasks assessed/performed   PT - Cognitive impairments: No family/caregiver present to determine baseline, Sequencing, Problem solving, Safety/Judgement, Initiation                         Following commands:  (therapeutic)  Following commands impaired: Follows multi-step commands with increased time    Cueing Cueing Techniques: Verbal cues  Exercises Other Exercises Other Exercises: hip/knee flex/ext x10 reps bil with graded resistance and NO SIGN OF PAIN IN SUPINE. OR SITTING    General Comments General comments (skin integrity, edema, etc.): low 90's on RA      Pertinent Vitals/Pain Pain Assessment Pain Assessment: 0-10 Pain Score: 6  Pain Location: Back and LEs Pain Descriptors / Indicators: Discomfort, Grimacing,  Burning Pain Intervention(s): Limited activity within patient's tolerance, Monitored during session, Premedicated before session    Home Living                          Prior Function            PT Goals (current goals can now be found in the care plan section) Acute Rehab PT Goals PT Goal Formulation: With patient Time For Goal Achievement: 02/02/24 Potential to Achieve Goals: Fair Progress towards PT goals: Progressing toward goals    Frequency    Min 2X/week      PT Plan      Co-evaluation              AM-PAC PT 6 Clicks Mobility   Outcome Measure  Help needed turning from your back to your side while in a flat bed without using bedrails?: None Help needed moving from lying on your back to sitting on the side of a flat bed without using bedrails?: A Little Help needed moving to and from a bed to a chair (including a wheelchair)?: A Little Help needed standing up from a chair using your arms (e.g., wheelchair or bedside chair)?: A Little Help needed to walk in hospital room?: A Little Help needed climbing 3-5 steps with a railing? : A Lot 6 Click Score: 18    End of Session   Activity Tolerance: Patient tolerated treatment well;Patient limited by pain Patient left: in bed;with call bell/phone within reach;with bed alarm set Nurse Communication: Mobility status PT Visit Diagnosis: Unsteadiness on feet (R26.81);Other abnormalities of gait and mobility (R26.89);Muscle weakness (generalized) (M62.81);Difficulty in walking, not elsewhere classified (R26.2) Pain - part of body: Leg     Time: 4098-1191 PT Time Calculation (min) (ACUTE ONLY): 36 min  Charges:    $Gait Training: 8-22 mins $Therapeutic Activity: 8-22 mins PT General Charges $$ ACUTE PT VISIT: 1 Visit                     01/25/2024  Nohemi Batters., PT Acute Rehabilitation Services [phone]  (office)   Durell Gilding Zaidy Absher 01/25/2024, 2:03 PM

## 2024-01-26 ENCOUNTER — Encounter (HOSPITAL_COMMUNITY)
Admission: EM | Disposition: A | Discharge status: Home Health | Payer: Self-pay | Source: Home / Self Care | Attending: Internal Medicine

## 2024-01-26 DIAGNOSIS — I4891 Unspecified atrial fibrillation: Secondary | ICD-10-CM | POA: Diagnosis not present

## 2024-01-26 DIAGNOSIS — I959 Hypotension, unspecified: Secondary | ICD-10-CM | POA: Diagnosis not present

## 2024-01-26 DIAGNOSIS — I5023 Acute on chronic systolic (congestive) heart failure: Secondary | ICD-10-CM | POA: Diagnosis not present

## 2024-01-26 DIAGNOSIS — I340 Nonrheumatic mitral (valve) insufficiency: Secondary | ICD-10-CM | POA: Diagnosis not present

## 2024-01-26 DIAGNOSIS — I4819 Other persistent atrial fibrillation: Secondary | ICD-10-CM | POA: Diagnosis not present

## 2024-01-26 LAB — BASIC METABOLIC PANEL WITH GFR
Anion gap: 14 (ref 5–15)
Anion gap: 14 (ref 5–15)
BUN: 48 mg/dL — ABNORMAL HIGH (ref 8–23)
BUN: 37 mg/dL — ABNORMAL HIGH (ref 8–23)
CO2: 29 mmol/L (ref 22–32)
CO2: 29 mmol/L (ref 22–32)
Calcium: 8.7 mg/dL — ABNORMAL LOW (ref 8.9–10.3)
Calcium: 9 mg/dL (ref 8.9–10.3)
Chloride: 85 mmol/L — ABNORMAL LOW (ref 98–111)
Chloride: 84 mmol/L — ABNORMAL LOW (ref 98–111)
Creatinine, Ser: 1.33 mg/dL — ABNORMAL HIGH (ref 0.44–1.00)
Creatinine, Ser: 1.01 mg/dL — ABNORMAL HIGH (ref 0.44–1.00)
GFR, Estimated: 44 mL/min — ABNORMAL LOW (ref >60)
GFR, Estimated: >60 mL/min (ref >60)
Glucose, Bld: 94 mg/dL (ref 70–99)
Glucose, Bld: 94 mg/dL (ref 70–99)
Potassium: 3.9 mmol/L (ref 3.5–5.1)
Potassium: 3.7 mmol/L (ref 3.5–5.1)
Sodium: 128 mmol/L — ABNORMAL LOW (ref 135–145)
Sodium: 127 mmol/L — ABNORMAL LOW (ref 135–145)

## 2024-01-26 LAB — APTT: aPTT: 50 s — ABNORMAL HIGH (ref 24–36)

## 2024-01-26 LAB — MAGNESIUM: Magnesium: 2.3 mg/dL (ref 1.7–2.4)

## 2024-01-26 LAB — MRSA NEXT GEN BY PCR, NASAL: MRSA by PCR Next Gen: NOT DETECTED

## 2024-01-26 SURGERY — CARDIOVERSION (CATH LAB)
Anesthesia: General

## 2024-01-26 MED ORDER — HEPARIN (PORCINE) 25000 UT/250ML-% IV SOLN
1300.0000 [IU]/h | INTRAVENOUS | Status: DC
  Administered 2024-01-26: 1100 [IU]/h via INTRAVENOUS
  Filled 2024-01-26: qty 250

## 2024-01-26 MED ORDER — CHLORHEXIDINE GLUCONATE CLOTH 2 % EX PADS
6.0000 | MEDICATED_PAD | Freq: Every day | CUTANEOUS | Status: DC
  Administered 2024-01-26 – 2024-01-27 (×3): 6 via TOPICAL

## 2024-01-26 MED ORDER — AMIODARONE HCL 200 MG PO TABS
200.0000 mg | ORAL_TABLET | Freq: Two times a day (BID) | ORAL | Status: DC
  Administered 2024-01-26 (×2): 200 mg via ORAL
  Filled 2024-01-26 (×2): qty 1

## 2024-01-26 MED ORDER — MAGNESIUM SULFATE 2 GM/50ML IV SOLN
2.0000 g | Freq: Once | INTRAVENOUS | Status: AC
  Administered 2024-01-26: 2 g via INTRAVENOUS
  Filled 2024-01-26: qty 50

## 2024-01-26 MED ORDER — ASPIRIN 81 MG PO CHEW
81.0000 mg | CHEWABLE_TABLET | ORAL | Status: AC
  Administered 2024-01-27: 81 mg via ORAL
  Filled 2024-01-26: qty 1

## 2024-01-26 MED ORDER — SODIUM CHLORIDE 0.9 % IV SOLN: INTRAVENOUS | Status: DC

## 2024-01-26 NOTE — Progress Notes (Addendum)
 Patient ID: Autumn [name], female   DOB: [birth year], 68 y.o.   MRN: [id number]     Cardiology Rounding Note  Cardiologist: Jerryl Morin, DO  Chief Complaint: CHF  Subjective:    6/11: TEE-guided DCCV, she went back into NSR on 2nd shock. TEE showed LV EF 30-35%, moderate LV dilation, mild RV dysfunction, moderate TR, severe MR with restriction of the posterior leaflet and malcoaptation.  6/15: Diuretics stopped d/t rising Scr and hypotension. Received 500 cc bolus LR. Back in AF.   Converted to SR 06/17 with IV amiodarone .  Scr continues to improve, down to 1.3 today.  Feeling well this am. Back pain improved. Ambulated halls yesterday. No dyspnea.     Objective:    Weight Range: 88.5 kg Body mass index is 33.49 kg/m.   Vital Signs:   Temp:  [97.6 F (36.4 C)-98 F (36.7 C)] 98 F (36.7 C) (06/18 0522) Pulse Rate:  [54-62] 60 (06/18 0522) Resp:  [16-20] 17 (06/18 0522) BP: (88-124)/(45-90) 118/65 (06/18 0522) SpO2:  [94 %-98 %] 98 % (06/18 0522) Weight:  [88.5 kg] 88.5 kg (06/18 0522) Last BM Date : 01/24/24  Weight change: Filed Weights   01/24/24 0410 01/25/24 0500 01/26/24 0522  Weight: 84.2 kg 83.6 kg 88.5 kg   Intake/Output:  Intake/Output Summary (Last 24 hours) at 01/26/2024 0721 Last data filed at 01/25/2024 2350 Gross per 24 hour  Intake 480 ml  Output 1000 ml  Net -520 ml    Physical Exam    General:  Appears older than stated age. Neck: Jvp 7-8 Cor: Regular rate & rhythm. No rubs, gallops or murmurs. Lungs: clear Abdomen: soft, nontender, nondistended.  Extremities: no edema Neuro: alert & orientedx3. Affect pleasant   Telemetry   NSR 70s-80s   Labs    CBC No results for input(s): WBC, NEUTROABS, HGB, HCT, MCV, PLT in the last 72 hours.  Basic Metabolic Panel Recent Labs    40/98/11 0500 01/26/24 0357  NA 129* 128*  K 3.3* 3.9  CL 87* 85*  CO2 28 29  GLUCOSE 118* 94  BUN 51* 48*  CREATININE 1.41* 1.33*  CALCIUM  9.2  8.7*  MG 1.9 2.3   BNP (last 3 results) Recent Labs    07/02/23 1153 08/06/23 1802 01/17/24 1034  BNP 411.5* 238.2* 826.3*   Imaging   No results found.    Medications:    Scheduled Medications:  acetaminophen   1,000 mg Oral Q6H   atorvastatin   80 mg Oral Daily   DULoxetine   60 mg Oral Daily   feeding supplement  237 mL Oral BID BM   lidocaine   1 patch Transdermal Q24H   metoprolol  succinate  25 mg Oral Daily   Muscle Rub   Topical TID   nicotine   21 mg Transdermal Daily   pregabalin   75 mg Oral BID   rivaroxaban   20 mg Oral Q supper   sodium chloride  flush  3-10 mL Intravenous Q12H   spironolactone   12.5 mg Oral Daily   torsemide   60 mg Oral Daily   Infusions:  amiodarone  30 mg/hr (01/25/24 2104)     PRN Medications: hydrOXYzine , ipratropium-albuterol , methocarbamol , mouth rinse, polyethylene glycol, sodium chloride  flush, traMADol, trimethobenzamide  Assessment/Plan   1. Acute on chronic systolic CHF: Echo in 6/22 with EF 60-65%.  Had multiple echoes in 2024, each with lower EF.  Echo in 10/24 with EF 30-35%, moderate-severe MR.  She has a history of CAD but denies recent chest pain.  Cath  12/24 showed significant CAD but no critical lesion.  TEE 12/24 with EF 25-30%, moderate RV dysfunction, moderate MR. 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  TEE yesterday showed EF 30-35%, moderate LV dilation, mild RV dysfunction, moderate TR, severe mitral regurgitation with restriction of the posterior leaflet and malcoaptation. She required milrinone  with diuresis in 12/24.  She is readmitted in AF/RVR (last ECG in 1/25 also showed AF) off all her medications with significant volume overload on exam.  - Diuretics previously on hold with AKI and hypotension. Received 500 cc bolus LR on 06/15.  - Torsemide  restarted 6/16: 60 mg daily. Responding well.  - Start losartan  25 mg daily (eventually would like to retrial  entresto ) - Restart Farxiga  10 mg daily -Continue spiro 12.5 mg daily - She is not a candidate for advanced therapies with active substance abuse and noncompliance.   2. Atrial fibrillation: Persistent, multiple admissions with AF/RVR.  Cardioverted to NSR in 12/24 but stopped amiodarone  and Xarelto  when she went home and went back into AF.  She was in AF in 1/25, no ECGs after that until this admission when she was in AF with RVR.  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  She does not tolerate AF well. She had TEE-guided DCCV 6/11 and is back in NSR.  - Back in Afib 100s-110s 6/15 afternoon. Will load with IV amiodarone  for another couple of days. Planned DCCV on 06/18 if she does not hold NSR. Back in NSR this morning.  - Continue low dose Toprol  XL 25 daily.  Holding digoxin  for now given AKI - Continue Xarelto   - Would favor eventual AF ablation.   3. 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 Moderate on TEE 12/24.  Severe mitral regurgitation on TEE 6/11 with restricted posterior leaflet and malcoaptation.  - Repeat echo 6/13 shows significant improvement in MR since she has been cardioverted and diuresed, now mild to moderate  4. CAD: DES to LAD in 9/20.  No recent chest pain. LHC 12/24 showed patent LAD stent, 75% mid LCx stenosis and heavily calcified 60% proximal RCA stenosis. Managed medically given no critical lesion and no chest pain. Troponin minimally elevated with no trend.  - Continue atorvastatin .  - No ASA with Xarelto  use.   5. COPD: She smokes and uses home oxygen  at times.  - Needs to quit smoking.   6. Back pain: Chronic issue - MRI with severe L3-L4 and L4-L5 with severe spinal stenosis and concern for possible discitis.  - ESR and CRP okay. BC pending.  7. OSA: Suspected.  - Will need sleep study.   8. PFO: Small, noted on TEE.   9. Substance abuse: Cocaine in past though UDS did not show cocaine this admission. She  continues to use methamphetamines and gets Xanax  off the street.  Self-medicating for depression and back pain.  - Prev treated with Suboxone . Cymbalta  per primary.    10. AKI: Worsening renal function (0.97 > 1.21 > 1.80>1.87), suspect from overdiuresis.  Diuretics and GDMT held. Given IVF 06/15 - Scr now down trending and diuretics restarted yesterday. 1.41 today. Restart spiro as above  ADDENDUM: Around 9:30 am had more frequent PVCs on telemetry.  ~ 5 minutes later patient went into polymorphic VT that appeared to possibly degenerate into VF then back to SR/sinus brady 50s-60s.   I was in the hallway near her room and heard the patient call out. When I arrived to bedside  she appeared confused but quickly reoriented. Reported dizziness prior to the event but does not recall calling for help.  She was transferred to the ICU for closer monitoring. Plan for [facility] tomorrow. EP consulted. 2 g IV mag ordered. Repeat BMET.  Length of Stay: 8  FINCH, LINDSAY N, PA-C  01/26/2024, 7:21 AM   Agree with above.   Patient in sinus rhythm on amio but this am developed VT/VF which spontaneously broke. Denies  antecedent CP. Feels fine now.   Continues with back pain. MRI concerning for diskitis   General: Sitting up in bed No resp difficulty HEENT: normal Neck: supple. JVP 6-7 Carotids 2+ bilat; no bruits. No lymphadenopathy or thryomegaly appreciated. Cor: Regular rate & rhythm. No rubs, gallops or murmurs. Lungs: clear Abdomen: obese soft, nontender, nondistended. No hepatosplenomegaly. No bruits or masses. Good bowel sounds. Extremities: no cyanosis, clubbing, rash, edema Neuro: alert & orientedx3, cranial nerves grossly intact. moves all 4 extremities w/o difficulty. Affect pleasant  Tele with VT/VF.   Will move to ICU. Plan coronary angio tomorrow. (Stop Xarelto . Start heparin ).   Consult EP.   Given diskitis likely not candidate for ICD currently. Likely LifeVest at d/c. Await EP recs.    CRITICAL CARE Performed by: Jules Oar  Total critical care time: 40 minutes  Critical care time was exclusive of separately billable procedures and treating other patients.  Critical care was necessary to treat or prevent imminent or life-threatening deterioration.  Critical care was time spent personally by me (independent of midlevel providers or residents) on the following activities: development of treatment plan with patient and/or surrogate as well as nursing, discussions with consultants, evaluation of patient's response to treatment, examination of patient, obtaining history from patient or surrogate, ordering and performing treatments and interventions, ordering and review of laboratory studies, ordering and review of radiographic studies, pulse oximetry and re-evaluation of patient's condition.  Jules Oar, MD  11:06 AM

## 2024-01-26 NOTE — Progress Notes (Signed)
 PHARMACY - ANTICOAGULATION CONSULT NOTE  Pharmacy Consult for heparin  Indication: atrial fibrillation  Labs: Recent Labs    01/25/24 0500 01/26/24 0357 01/26/24 1355 01/26/24 2246  APTT  --   --   --  50*  CREATININE 1.41* 1.33* 1.01*  --    Assessment: 67yo female subtherapeutic on heparin  with initial dosing while DOAC on hold; no infusion issues or signs of bleeding per RN.  Goal of Therapy:  aPTT 66-102 seconds   Plan:  Increase heparin  infusion by 2-3 units/kg/hr to 1300 units/hr. Check PTT in 8 hours.   Lonnie Roberts, PharmD, BCPS 01/26/2024 11:25 PM

## 2024-01-26 NOTE — Consult Note (Signed)
 ELECTROPHYSIOLOGY CONSULT NOTE    Patient ID: [name] MRN: [id number], DOB/AGE: Aug 12, 1955 68 y.o.  Admit date: 01/17/2024 Date of Consult: 01/26/2024  Primary Physician: Autumn Combs, MD Primary Cardiologist: Autumn Tobb, DO  Electrophysiologist: New   Referring Provider: Dr. Julane [name]   Patient Profile: Autumn [name] is a 68 y.o. female with a history of systolic CHF, AF with RVR, MR, CAD, COPD, and h/o substance abuse who is being seen today for the evaluation of VF/PMVT at the request of Dr. Bensimhon.  HPI:  Autumn [name] is a 68 y.o. female with history as above, admitted for A/C CHF in and AF RVR.   Has h/o non compliance and had several admission for CHF and HF last year. April, June, July, October, and December.   Presented to ED 6/9 again with non-compliance and also complaining of hip pain. Reported > 1 month since cocaine use and > 1 week for meth use. USD positive for amphetamines, benzos, and THC.   Started on IV lasix  and amiodarone . Underwent TEE/DCC 6/11 to sinus. EF 30-35%, severe MR.    TTE 01/21/2024 LVEF 35-40%, grade III DD, mild/mod MR.  On 6/15 diuretics were held briefly with AKI, but have since been restarted.  Also went back into AF 6/15 and has since been going in and out.   6/16 Lumbar MRI with on-going back pain. Concerning for discitis.   This am pt began to have bigeminal PVCs -> PMVT -> VF -> sinus brady. She continued to have brief runs of PMVT and EP was consulted.  Pt was awake during the episodes and called out; complained of only dizziness. Denies LOC.   Pt at rest currently without complaint.  States she has continued to use drugs but plans to stop on discharge. Currently staying with her Autumn [name] and his family. Denies chest pain or syncope. Reports med-noncompliance as above.    Labs Potassium3.9 (06/18 0357) Magnesium   2.3 (06/18 0357) Creatinine, ser  1.33* (06/18 0357)        .    Past Medical History:   Diagnosis Date   Anxiety    Arthritis    Attention deficit disorder (ADD)    COPD (chronic obstructive pulmonary disease) (HCC)    Depression    Drug abuse (HCC)    Fibromyalgia    HFrEF (heart failure with reduced ejection [name]) (HCC)    Hyperlipidemia    Paroxysmal Atrial fibrillation      Surgical History:  Past Surgical History:  Procedure Laterality Date   ANKLE SURGERY     CARDIOVERSION N/A 08/10/2023   Procedure: CARDIOVERSION;  Surgeon: Autumn Eisenmenger, MD;  Location: [facility] INVASIVE CV LAB;  Service: Cardiovascular;  Laterality: N/A;   CARDIOVERSION N/A 01/19/2024   Procedure: CARDIOVERSION;  Surgeon: Autumn Eisenmenger, MD;  Location: [facility] INVASIVE CV LAB;  Service: Cardiovascular;  Laterality: N/A;   FACIAL COSMETIC SURGERY     RIGHT/LEFT HEART CATH AND CORONARY ANGIOGRAPHY N/A 08/09/2023   Procedure: RIGHT/LEFT HEART CATH AND CORONARY ANGIOGRAPHY;  Surgeon: Autumn Eisenmenger, MD;  Location: [facility] INVASIVE CV LAB;  Service: Cardiovascular;  Laterality: N/A;   TRANSESOPHAGEAL ECHOCARDIOGRAM (CATH LAB) N/A 08/10/2023   Procedure: TRANSESOPHAGEAL ECHOCARDIOGRAM;  Surgeon: Autumn Eisenmenger, MD;  Location: [facility] INVASIVE CV LAB;  Service: Cardiovascular;  Laterality: N/A;   TRANSESOPHAGEAL ECHOCARDIOGRAM (CATH LAB) N/A 01/19/2024   Procedure: TRANSESOPHAGEAL ECHOCARDIOGRAM;  Surgeon: Autumn Eisenmenger, MD;  Location: [facility] INVASIVE CV LAB;  Service: Cardiovascular;  Laterality:  N/A;     Medications Prior to Admission  Medication Sig Dispense Refill Last Dose/Taking   albuterol  (VENTOLIN  HFA) 108 (90 Base) MCG/ACT inhaler Inhale 2 puffs into the lungs every 4 (four) hours as needed for wheezing.   Past Week   Budeson-Glycopyrrol-Formoterol  (BREZTRI  AEROSPHERE) 160-9-4.8 MCG/ACT AERO Inhale 2 puffs into the lungs in the morning and at bedtime. 10.7 g 0 Past Week   dapagliflozin  propanediol (FARXIGA ) 10 MG TABS tablet Take 1 tablet (10 mg total) by mouth daily. 90 tablet 3 Past Week    gabapentin  (NEURONTIN ) 300 MG capsule Take 2 capsules (600 mg total) by mouth 3 (three) times daily as needed (Takes 2-3 as needed). (Patient taking differently: Take 600 mg by mouth 3 (three) times daily as needed (for pain).) 90 capsule 0 Past Week   methocarbamol  (ROBAXIN ) 500 MG tablet Take 1 tablet (500 mg total) by mouth every 8 (eight) hours as needed for muscle spasms. 90 tablet 1 Past Week   metoprolol  succinate (TOPROL -XL) 25 MG 24 hr tablet Take 1 tablet (25 mg total) by mouth daily. Take with or immediately following a meal. 90 tablet 3 Past Week   sacubitril -valsartan  (ENTRESTO ) 24-26 MG Take 1 tablet by mouth 2 (two) times daily. 60 tablet 11 Past Week   senna-docusate (SENOKOT-S) 8.6-50 MG tablet Take 1 tablet by mouth 2 (two) times daily. 60 tablet 1 Past Week   spironolactone  (ALDACTONE ) 25 MG tablet Take 1 tablet (25 mg total) by mouth daily. 90 tablet 3 Past Week   torsemide  (DEMADEX ) 20 MG tablet Take 3 tablets (60 mg total) by mouth 2 (two) times daily. 180 tablet 3 Past Week   amiodarone  (PACERONE ) 200 MG tablet Take 1 tablet (200 mg total) by mouth 2 (two) times daily for 14 days, THEN 2 tablets (400 mg total) daily. (Patient not taking: Reported on 01/18/2024) 200 tablet 3 Not Taking   atorvastatin  (LIPITOR ) 80 MG tablet Take 1 tablet (80 mg total) by mouth daily. (Patient not taking: Reported on 01/18/2024) 90 tablet 3 Not Taking   naloxone  (NARCAN ) nasal spray 4 mg/0.1 mL Place 4 mg into the nose as needed (overdose, as needed).   Unknown   nicotine  (NICODERM CQ  - DOSED IN MG/24 HOURS) 14 mg/24hr patch Place 1 patch (14 mg total) onto the skin daily. (Patient not taking: Reported on 01/18/2024) 28 patch 0 Not Taking   rivaroxaban  (XARELTO ) 20 MG TABS tablet Take 1 tablet (20 mg total) by mouth daily with supper. (Patient not taking: Reported on 01/18/2024) 90 tablet 3 Not Taking    Inpatient Medications:   acetaminophen   1,000 mg Oral Q6H   atorvastatin   80 mg Oral Daily    DULoxetine   60 mg Oral Daily   feeding supplement  237 mL Oral BID BM   lidocaine   1 patch Transdermal Q24H   metoprolol  succinate  25 mg Oral Daily   Muscle Rub   Topical TID   nicotine   21 mg Transdermal Daily   pregabalin   75 mg Oral BID   sodium chloride  flush  3-10 mL Intravenous Q12H   spironolactone   12.5 mg Oral Daily   torsemide   60 mg Oral Daily    Allergies:  Allergies  Allergen Reactions   Pamelor [Nortriptyline Hcl] Anaphylaxis   Vibra-Tab [Doxycycline] Other (See Comments)    Unknown reaction    History reviewed. No pertinent family history.   Physical Exam: Vitals:   01/25/24 1944 01/25/24 2355 01/26/24 0522 01/26/24 0734  BP: 103/86 Aaron Aas)  124/90 118/65 109/69  Pulse:  (!) 54 60   Resp: 17 16 17 20   Temp: 98 F (36.7 C) 97.7 F (36.5 C) 98 F (36.7 C) 98.2 F (36.8 C)  TempSrc: Oral Oral Oral Oral  SpO2: 94% 95% 98% 97%  Weight:   88.5 kg   Height:       GEN: NAD. Chronically ill appearing.  NECK: No JVD; No carotid bruits CARDIAC: Regular rate and rhythm, no murmurs, rubs, gallops RESPIRATORY:  Clear to auscultation without rales, wheezing or rhonchi  ABDOMEN: Soft, non-tender, non-distended EXTREMITIES:  No edema; No deformity     Radiology/Studies: MR LUMBAR SPINE WO CONTRAST Result Date: 01/25/2024 CLINICAL DATA:  68 year old female with lumbar radiculopathy. EXAM: MRI LUMBAR SPINE WITHOUT CONTRAST TECHNIQUE: Multiplanar, multisequence MR imaging of the lumbar spine was performed. No intravenous contrast was administered. COMPARISON:  Lumbar radiographs 08/06/2023. FINDINGS: Segmentation:  Normal on the comparison. Alignment: Stable mild dextroconvex lower lumbar scoliosis (apex L3-L4 previously) and associated straightening of lumbar lordosis at those levels. Vertebrae: Confluent abnormal bone marrow signal in both the L3 and L4 vertebrae, with vertebral body and bilateral posterior element marrow edema. Abnormal signal in the intervening L3-L4 disc  space. And posterior element joint effusion also. The edema is asymmetric to the left. But there is little if any surrounding soft tissue edema at that level (mild in the medial left psoas muscle series 6, image 15, with some underlying psoas atrophy there). See additional details of those levels below. Background bone marrow signal is normal. Chronic degenerative endplate marrow signal changes also at L4-L5. Relatively maintained vertebral height. No other marrow edema or acute osseous abnormality. Conus medullaris and cauda equina: Conus extends to the L1 level. No lower spinal cord or conus signal abnormality. Paraspinal and other soft tissues: Negative except for faint marrow edema in the medial left psoas muscle at the L3-L4 level as described above. No paraspinal fluid collection. Negative visible abdominal viscera. Disc levels: Axial images are intermittently degraded by motion despite repeated imaging attempts. T10-T11 and T11-T12: Disc desiccation, disc space loss, circumferential disc bulge. Mild to moderate facet hypertrophy at each level. No significant spinal stenosis. Mild to moderate T10 neural foraminal stenosis greater on the right. T12-L1:  Negative. L1-L2: Disc desiccation, mild disc space loss, and circumferential disc bulge. Mild facet hypertrophy. No significant stenosis. L2-L3: Disc desiccation, disc space loss, circumferential disc bulge. Broad-based bilateral foraminal disc and moderate facet, mild ligament flavum hypertrophy. Mild subsequent spinal stenosis (series 8, image 20 and series 5, image 10). Mild left and mild-to-moderate right L2 neural foraminal stenosis (series 8, image 18). L3-L4: Severe disc space loss. Vacuum disc here on the December radiographs. Bulky circumferential disc osteophyte complex with a broad-based posterior component. Posterior annular fissure of the disc. Severe facet and ligament flavum hypertrophy. Degenerative appearing facet joint fluid, and an 8-9 mm  left-side synovial cyst projecting superiorly from that facet (series 8, image 23 and series 5, image 13). Subsequent severe spinal stenosis (series 8, image 25) with symmetric lateral recess involvement (L4 nerve levels). Severe left L3 neural foraminal stenosis. Mild to moderate right L3 foraminal stenosis. L4-L5: Severe disc space loss. Vacuum disc here in December. Bulky circumferential disc osteophyte complex. Moderate facet and ligament flavum hypertrophy. Moderate to severe spinal stenosis. Lateral recess stenosis here might be greater on the right (descending right L5 nerve level). Moderate to severe bilateral L4 neural foraminal stenosis appears greater on the left. L5-S1: Mild disc bulging and endplate spurring.  Mild facet hypertrophy. No stenosis. IMPRESSION: 1. Mildly motion degraded exam but advanced chronic lumbar disc and posterior element degeneration at L3-L4 and L4-L5 with Severe multifactorial spinal stenosis at Both levels. However, L3-L4 demonstrates confluent vertebral edema associated and conspicuous heterogeneous signal within the disc space there. Discitis Osteomyelitis is not excluded at L3-L4, although no significant paraspinal soft tissue inflammation at this time. Recommend Blood Cultures. And if the clinical course is equivocal a repeat Lumbar MRI without and with contrast in 10-14 days may be most valuable. 2. Associated severe neural foraminal stenosis at the left L3 and left greater than right L4 nerve levels. 3. Mild multifactorial spinal stenosis at L2-L3. Mild to moderate bilateral L2 neural foraminal stenosis. Electronically Signed   By: Marlise Simpers M.D.   On: 01/25/2024 04:30   ECHOCARDIOGRAM COMPLETE Result Date: 01/21/2024    ECHOCARDIOGRAM REPORT   Patient Name:   Autumn [name] Date of Exam: 01/21/2024 Medical Rec #:  [id number]       Height:       64.0 in Accession #:    [id number]      Weight:       203.4 lb Date of Birth:  1955-09-29       BSA:          1.970 m Patient Age:     67 years        BP:           91/66 mmHg Patient Gender: F               HR:           61 bpm. Exam Location:  Inpatient Procedure: 2D Echo, Cardiac Doppler and Color Doppler (Both Spectral and Color            Flow Doppler were utilized during procedure). Indications:     CHF  History:         Patient has prior history of Echocardiogram examinations. CHF;                  Risk Factors:Hypertension.  Sonographer:     Farrell Honey Key Referring Phys:  [id number] [place] LEE Diagnosing Phys: Carson Clara MD IMPRESSIONS  1. Left ventricular ejection [name], by estimation, is 35 to 40%. The left ventricle has moderately decreased function. The left ventricle demonstrates global hypokinesis. The left ventricular internal cavity size was moderately dilated. There is mild  left ventricular hypertrophy. Left ventricular diastolic parameters are consistent with Grade III diastolic dysfunction (restrictive).  2. Right ventricular systolic function is mildly reduced. The right ventricular size is normal. Tricuspid regurgitation signal is inadequate for assessing PA pressure.  3. Left atrial size was mildly dilated.  4. Right atrial size was mildly dilated.  5. The mitral valve is abnormal. Mild to moderate mitral valve regurgitation.  6. The aortic valve is tricuspid. Aortic valve regurgitation is trivial. Aortic valve sclerosis/calcification is present, without any evidence of aortic stenosis.  7. The inferior vena cava is normal in size with greater than 50% respiratory variability, suggesting right atrial pressure of 3 mmHg. Comparison(s): Compared to prior echo 01/19/24, MR has improved. FINDINGS  Left Ventricle: Left ventricular ejection [name], by estimation, is 35 to 40%. The left ventricle has moderately decreased function. The left ventricle demonstrates global hypokinesis. The left ventricular internal cavity size was moderately dilated. There is mild left ventricular hypertrophy. Left ventricular diastolic  parameters are consistent with Grade III diastolic dysfunction (restrictive). Normal left ventricular filling pressure.  Right Ventricle: The right ventricular size is normal. No increase in right ventricular wall thickness. Right ventricular systolic function is mildly reduced. Tricuspid regurgitation signal is inadequate for assessing PA pressure. Left Atrium: Left atrial size was mildly dilated. Right Atrium: Right atrial size was mildly dilated. Pericardium: There is no evidence of pericardial effusion. Mitral Valve: The mitral valve is abnormal. Mild to moderate mitral valve regurgitation. Tricuspid Valve: The tricuspid valve is normal in structure. Tricuspid valve regurgitation is trivial. Aortic Valve: The aortic valve is tricuspid. Aortic valve regurgitation is trivial. Aortic valve sclerosis/calcification is present, without any evidence of aortic stenosis. Pulmonic Valve: The pulmonic valve was grossly normal. Pulmonic valve regurgitation is trivial. Aorta: The aortic root and ascending aorta are structurally normal, with no evidence of dilitation. Venous: The inferior vena cava is normal in size with greater than 50% respiratory variability, suggesting right atrial pressure of 3 mmHg. IAS/Shunts: The interatrial septum was not well visualized.  LEFT VENTRICLE PLAX 2D LVIDd:         5.95 cm      Diastology LVIDs:         4.70 cm      LV e' medial:    4.13 cm/s LV PW:         1.10 cm      LV E/e' medial:  17.8 LV IVS:        1.00 cm      LV e' lateral:   5.55 cm/s LVOT diam:     1.90 cm      LV E/e' lateral: 13.2 LV SV:         30 LV SV Index:   15 LVOT Area:     2.84 cm  LV Volumes (MOD) LV vol d, MOD A2C: 115.0 ml LV vol d, MOD A4C: 193.0 ml LV vol s, MOD A2C: 74.9 ml LV vol s, MOD A4C: 98.1 ml LV SV MOD A2C:     40.1 ml LV SV MOD A4C:     193.0 ml LV SV MOD BP:      62.3 ml RIGHT VENTRICLE RV Basal diam:  3.70 cm RV S prime:     7.07 cm/s TAPSE (M-mode): 0.8 cm LEFT ATRIUM             Index        RIGHT  ATRIUM           Index LA diam:        5.50 cm 2.79 cm/m   RA Area:     24.10 cm LA Vol (A2C):   83.1 ml 42.17 ml/m  RA Volume:   78.30 ml  39.74 ml/m LA Vol (A4C):   73.4 ml 37.25 ml/m LA Biplane Vol: 78.6 ml 39.89 ml/m  AORTIC VALVE LVOT Vmax:   70.30 cm/s LVOT Vmean:  48.000 cm/s LVOT VTI:    0.106 m  AORTA Ao Root diam: 3.00 cm Ao Asc diam:  3.40 cm MITRAL VALVE MV Area (PHT): 4.26 cm    SHUNTS MV Decel Time: 178 msec    Systemic VTI:  0.11 m MV E velocity: 73.40 cm/s  Systemic Diam: 1.90 cm MV A velocity: 29.30 cm/s MV E/A ratio:  2.51 Carson Clara MD Electronically signed by Carson Clara MD Signature Date/Time: 01/21/2024/4:18:54 PM    Final (Updated)    ECHO TEE Result Date: 01/21/2024    TRANSESOPHOGEAL ECHO REPORT   Patient Name:   Autumn [name] Date of Exam: 01/19/2024 Medical Rec #:  [id number]       Height:       64.0 in Accession #:    [id number]      Weight:       207.5 lb Date of Birth:  1956/04/12       BSA:          1.987 m Patient Age:    67 years        BP:           93/69 mmHg Patient Gender: F               HR:           92 bpm. Exam Location:  Inpatient Procedure: Transesophageal Echo, 3D Echo, Color Doppler and Cardiac Doppler            (Both Spectral and Color Flow Doppler were utilized during            procedure). Indications:     I48.91* Unspecified atrial fibrillation  History:         Patient has prior history of Echocardiogram examinations, most                  recent 08/10/2023. CHF, CAD, COPD, Arrythmias:Atrial                  Fibrillation; Risk Factors:Dyslipidemia and Polysubstance                  Abuse.  Sonographer:     Sherline Distel Senior RDCS Referring Phys:  [id number] Jolinda Necessary [facility] Diagnosing Phys: Archer Bear PROCEDURE: After discussion of the risks and benefits of a TEE, an informed consent was obtained from the patient. The transesophogeal probe was passed without difficulty through the esophogus of the patient. Sedation performed by different  physician. The patient developed no complications during the procedure. A successful direct current cardioversion was performed at 360 joules with 2 attempts.  IMPRESSIONS  1. Left ventricular ejection [name], by estimation, is 30 to 35%. The left ventricle has moderately decreased function. The left ventricle demonstrates global hypokinesis. The left ventricular internal cavity size was mildly dilated.  2. Right ventricular systolic function is mildly reduced. The right ventricular size is mildly enlarged.  3. Left atrial size was moderately dilated. No left atrial/left atrial appendage thrombus was detected.  4. Right atrial size was moderately dilated.  5. Small PFO noted by color doppler.  6. Severe mitral regurgitation with restriction of posterior leaflet, there is malcoaptation of the leaflets. PISA ERO 0.8 cm^2. Vena contracta area 0.48 cm^2. There is systolic flow reversal in 2/3 pulmonary veins sampled. No evidence of mitral stenosis, mean gradient 4 mmHg.  7. Peak RV-RA gradient 27 mmHg. The tricuspid valve is abnormal. Tricuspid valve regurgitation is moderate.  8. The aortic valve is tricuspid. There is mild calcification of the aortic valve. Aortic valve regurgitation is trivial. No aortic stenosis is present.  9. 3D performed of the mitral valve. FINDINGS  Left Ventricle: Left ventricular ejection [name], by estimation, is 30 to 35%. The left ventricle has moderately decreased function. The left ventricle demonstrates global hypokinesis. The left ventricular internal cavity size was mildly dilated. There is no left ventricular hypertrophy. Right Ventricle: The right ventricular size is mildly enlarged. No increase in right ventricular wall thickness. Right ventricular systolic function is mildly reduced. Left Atrium: Left atrial size was moderately dilated. No left atrial/left atrial appendage thrombus was detected. Right Atrium: Right atrial size was moderately dilated. Pericardium: Trivial  pericardial effusion is present. Mitral Valve: Severe mitral regurgitation with restriction of posterior leaflet, there is malcoaptation of the leaflets. PISA ERO 0.8 cm^2. There is systolic flow reversal in 2/3 pulmonary veins sampled. The mitral valve is abnormal. Severe mitral valve regurgitation. No evidence of mitral valve stenosis. MV peak gradient, 8.6 mmHg. The mean mitral valve gradient is 4.0 mmHg. Tricuspid Valve: Peak RV-RA gradient 27 mmHg. The tricuspid valve is abnormal. Tricuspid valve regurgitation is moderate. Aortic Valve: The aortic valve is tricuspid. There is mild calcification of the aortic valve. Aortic valve regurgitation is trivial. No aortic stenosis is present. Pulmonic Valve: The pulmonic valve was normal in structure. Pulmonic valve regurgitation is not visualized. Aorta: The aortic root is normal in size and structure. IAS/Shunts: Small PFO noted by color doppler. Additional Comments: Spectral Doppler performed. MITRAL VALVE MV Peak grad: 8.6 mmHg MV Mean grad: 4.0 mmHg MV Vmax:      1.47 m/s MV Vmean:     87.6 cm/s Dalton McleanMD Electronically signed by Archer Bear Signature Date/Time: 01/21/2024/12:30:29 AM    Final    ECHOCARDIOGRAM COMPLETE Result Date: 01/19/2024    ECHOCARDIOGRAM REPORT   Patient Name:   Autumn [name] Date of Exam: 01/19/2024 Medical Rec #:  [id number]       Height:       64.0 in Accession #:    [id number]      Weight:       207.5 lb Date of Birth:  June 20, 1956       BSA:          1.987 m Patient Age:    67 years        BP:           87/52 mmHg Patient Gender: F               HR:           88 bpm. Exam Location:  Inpatient Procedure: 2D Echo, 3D Echo, Cardiac Doppler, Color Doppler and Strain Analysis            (Both Spectral and Color Flow Doppler were utilized during            procedure). Indications:    I50.21 Acute systolic (congestive) heart failure  History:        Patient has prior history of Echocardiogram examinations, most                  recent 01/19/2024. CHF, CAD, COPD; Risk Factors:Dyslipidemia and                 Polysubstance Abuse.  Sonographer:    Sherline Distel Senior RDCS Referring Phys: [phone] Jolinda Necessary [facility]  Sonographer Comments: Echo post cardioversion IMPRESSIONS  1. Left ventricular ejection [name], by estimation, is 30 to 35%. Left ventricular ejection [name] by 2D MOD biplane is 34.7 %. The left ventricle has moderately decreased function. The left ventricle demonstrates global hypokinesis. The left ventricular internal cavity size was severely dilated. There is mild asymmetric left ventricular hypertrophy of the infero-lateral segment. Left ventricular diastolic parameters are indeterminate. The average left ventricular global longitudinal strain is -11.3 %. The global longitudinal strain is abnormal.  2. Right ventricular systolic function is mildly reduced. The right ventricular size is moderately enlarged. There is moderately elevated pulmonary artery systolic pressure.  3. Left atrial size was severely dilated.  4. Right atrial size was severely dilated.  5. The mitral valve is abnormal. Severe mitral valve regurgitation. The  mean mitral valve gradient is 5.0 mmHg with average heart rate of 70 bpm. Moderate mitral annular calcification.  6. The tricuspid valve is abnormal. Tricuspid valve regurgitation is severe.  7. The aortic valve is tricuspid. There is mild calcification of the aortic valve. There is mild thickening of the aortic valve. Aortic valve regurgitation is mild. Aortic valve sclerosis is present, with no evidence of aortic valve stenosis.  8. The inferior vena cava is dilated in size with <50% respiratory variability, suggesting right atrial pressure of 15 mmHg.  9. Evidence of atrial level shunting detected by color flow Doppler. There is a small patent foramen ovale. Comparison(s): Prior images reviewed side by side. Conclusion(s)/Recommendation(s): See TEE from today as well; severe MR with restricted motion of  posterior MV leaflet. Global hypokinesis with EF 30-35%. FINDINGS  Left Ventricle: Left ventricular ejection [name], by estimation, is 30 to 35%. Left ventricular ejection [name] by 2D MOD biplane is 34.7 %. The left ventricle has moderately decreased function. The left ventricle demonstrates global hypokinesis. The average left ventricular global longitudinal strain is -11.3 %. Strain was performed and the global longitudinal strain is abnormal. The left ventricular internal cavity size was severely dilated. There is mild asymmetric left ventricular hypertrophy  of the infero-lateral segment. Left ventricular diastolic parameters are indeterminate. Right Ventricle: The right ventricular size is moderately enlarged. No increase in right ventricular wall thickness. Right ventricular systolic function is mildly reduced. There is moderately elevated pulmonary artery systolic pressure. The tricuspid regurgitant velocity is 2.94 m/s, and with an assumed right atrial pressure of 15 mmHg, the estimated right ventricular systolic pressure is 49.6 mmHg. Left Atrium: Left atrial size was severely dilated. Right Atrium: Right atrial size was severely dilated. Pericardium: Trivial pericardial effusion is present. The pericardial effusion is localized near the right atrium. Mitral Valve: Severe restriction of motion of posterior mitral valve leaflet. The mitral valve is abnormal. Moderate mitral annular calcification. Severe mitral valve regurgitation, with centrally-directed jet. MV peak gradient, 15.5 mmHg. The mean mitral valve gradient is 5.0 mmHg with average heart rate of 70 bpm. Tricuspid Valve: The tricuspid valve is abnormal. Tricuspid valve regurgitation is severe. No evidence of tricuspid stenosis. The flow in the hepatic veins is reversed during ventricular systole. Aortic Valve: The aortic valve is tricuspid. There is mild calcification of the aortic valve. There is mild thickening of the aortic valve. There is  moderate aortic valve annular calcification. Aortic valve regurgitation is mild. Aortic valve sclerosis is present, with no evidence of aortic valve stenosis. Pulmonic Valve: The pulmonic valve was not well visualized. Pulmonic valve regurgitation is trivial. No evidence of pulmonic stenosis. Aorta: The aortic root and ascending aorta are structurally normal, with no evidence of dilitation. Venous: The inferior vena cava is dilated in size with less than 50% respiratory variability, suggesting right atrial pressure of 15 mmHg. The inferior vena cava and the hepatic vein show a pattern of systolic flow reversal, suggestive of tricuspid regurgitation. IAS/Shunts: Evidence of atrial level shunting detected by color flow Doppler. A small patent foramen ovale is detected. Additional Comments: 3D was performed not requiring image post processing on an independent workstation and was abnormal.  LEFT VENTRICLE PLAX 2D                        Biplane EF (MOD) LVIDd:         6.90 cm         LV Biplane EF:  Left LVIDs:         6.00 cm                          ventricular LV PW:         1.20 cm                          ejection LV IVS:        0.90 cm                          [name] by LVOT diam:     2.20 cm                          2D MOD LV SV:         52                               biplane is LV SV Index:   26                               34.7 %. LVOT Area:     3.80 cm                                 2D Longitudinal LV Volumes (MOD)               Strain LV vol d, MOD    263.0 ml      2D Strain GLS   -10.6 % A2C:                           (A4C): LV vol d, MOD    187.0 ml      2D Strain GLS   -12.4 % A4C:                           (A3C): LV vol s, MOD    166.0 ml      2D Strain GLS   -10.8 % A2C:                           (A2C): LV vol s, MOD    127.0 ml      2D Strain GLS   -11.3 % A4C:                           Avg: LV SV MOD A2C:   97.0 ml LV SV MOD A4C:   187.0 ml LV SV MOD BP:    78.0 ml                                 3D Volume EF:                                3D EF:        37 %  LV EDV:       240 ml                                LV ESV:       151 ml                                LV SV:        89 ml RIGHT VENTRICLE RV S prime:     7.94 cm/s TAPSE (M-mode): 1.9 cm LEFT ATRIUM              Index        RIGHT ATRIUM           Index LA diam:        5.80 cm  2.92 cm/m   RA Area:     32.10 cm LA Vol (A2C):   152.0 ml 76.50 ml/m  RA Volume:   122.00 ml 61.40 ml/m LA Vol (A4C):   128.0 ml 64.42 ml/m LA Biplane Vol: 149.0 ml 74.99 ml/m  AORTIC VALVE LVOT Vmax:   72.00 cm/s LVOT Vmean:  52.200 cm/s LVOT VTI:    0.137 m  AORTA Ao Root diam: 2.90 cm Ao Asc diam:  3.30 cm MITRAL VALVE                TRICUSPID VALVE MV Area (PHT): 3.13 cm     TR Peak grad:   34.6 mmHg MV Area VTI:   1.11 cm     TR Vmax:        294.00 cm/s MV Peak grad:  15.5 mmHg MV Mean grad:  5.0 mmHg     SHUNTS MV Vmax:       1.97 m/s     Systemic VTI:  0.14 m MV Vmean:      101.0 cm/s   Systemic Diam: 2.20 cm MV Decel Time: 242 msec MV E velocity: 164.00 cm/s Sheryle Donning MD Electronically signed by Sheryle Donning MD Signature Date/Time: 01/19/2024/2:13:31 PM    Final    EP STUDY Result Date: 01/19/2024 See surgical note for result.  DG Chest 1 View Result Date: 01/17/2024 CLINICAL DATA:  Fall. EXAM: CHEST  1 VIEW COMPARISON:  08/09/2023. FINDINGS: Cardiomegaly. Aortic atherosclerosis. No focal consolidation, sizeable pleural effusion, or pneumothorax. No acute osseous abnormality identified. IMPRESSION: 1. No acute findings in the chest. 2. Cardiomegaly. Electronically Signed   By: Mannie Seek M.D.   On: 01/17/2024 12:49   DG HIP UNILAT W OR W/O PELVIS 2-3 VIEWS LEFT Result Date: 01/17/2024 CLINICAL DATA:  Pain after fall. EXAM: DG HIP (WITH OR WITHOUT PELVIS) 2-3V LEFT COMPARISON:  08/06/2023. FINDINGS: There is no evidence of acute fracture or dislocation. Femoral heads are seated within the  acetabulum. Mild degenerative changes of the bilateral hips. Sacroiliac joints and pubic symphysis appear anatomically aligned. Degenerative changes of the visualized lower lumbar spine. IMPRESSION: No acute osseous abnormality. Electronically Signed   By: Mannie Seek M.D.   On: 01/17/2024 12:47    EKG: today shows sinus bradycardia with bigeminal PVCs, resulting in short runs of PMVT/TdP (personally reviewed)  TELEMETRY: Sinus bradycardia/NSR 50-60s currently; Episodes this am show bigeminal PVCs, short runs of NSVT/PMVT, concerning for TdP with one episode likely showing brief VF  (personally reviewed)  Assessment/Plan:  PMVT / VF Torsades de Pointes Potentially in the setting of  bradycardia with PVCs and prolonged qt on IV amiodarone . 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  Will stop IV amiodarone  which affects IKr and can increase risk of TdP OK to use po amiodarone  and follow May also be 2/2 ischemia. Per Dr. Bensimhon tentatively plan for cath tomorrow.   Persistent AF with RVR NSR now after TEE/DCC 6/11, but had been going in and out.  Not candidate for AF ablation with other issues and non-compliance.  Was on Xarelto  > Heparin  for cath tomorrow.   CAD Cath 07/2023 with patent LAD stent bu 75% LCx lesion and 60% RCA lesion On for [facility] tomorrow.   Substance Abuse Concerns for discitis by MRI Medical non-compliance She is not currently a candidate for EP procedures.   MD has seen.   For questions or updates, please contact [facility] Please consult [email or web address] for contact info under     Signed, Tylene Galla, PA-C  01/26/2024, 10:01 AM

## 2024-01-26 NOTE — Progress Notes (Signed)
 Had planned to start losartan  and add farxiga  today.   Will hold off for today now that LHC is planned for tomorrow.

## 2024-01-26 NOTE — Consult Note (Addendum)
 PHARMACY - ANTICOAGULATION CONSULT NOTE  Pharmacy Consult for Heparin  Initiation Indication: atrial fibrillation  Allergies  Allergen Reactions   Pamelor [Nortriptyline Hcl] Anaphylaxis   Vibra-Tab [Doxycycline] Other (See Comments)    Unknown reaction    Patient Measurements: Height: 5' 4 (162.6 cm) Weight: 88.5 kg (195 lb 1.7 oz) IBW/kg (Calculated) : 54.7 HEPARIN  DW (KG): 74.6  Vital Signs: Temp: 98.2 F (36.8 C) (06/18 0734) Temp Source: Oral (06/18 0734) BP: 109/69 (06/18 0734) Pulse Rate: 56 (06/18 1020)  Labs: Recent Labs    01/24/24 0422 01/25/24 0500 01/26/24 0357  CREATININE 1.87* 1.41* 1.33*    Estimated Creatinine Clearance: 44.8 mL/min (A) (by C-G formula based on SCr of 1.33 mg/dL (H)).   Medical History: Past Medical History:  Diagnosis Date   Anxiety    Arthritis    Attention deficit disorder (ADD)    COPD (chronic obstructive pulmonary disease) (HCC)    Depression    Drug abuse (HCC)    Fibromyalgia    HFrEF (heart failure with reduced ejection fraction) (HCC)    Hyperlipidemia    Paroxysmal Atrial fibrillation     Medications:  Scheduled:   acetaminophen   1,000 mg Oral Q6H   amiodarone   200 mg Oral BID   atorvastatin   80 mg Oral Daily   Chlorhexidine  Gluconate Cloth  6 each Topical Daily   DULoxetine   60 mg Oral Daily   feeding supplement  237 mL Oral BID BM   lidocaine   1 patch Transdermal Q24H   metoprolol  succinate  25 mg Oral Daily   Muscle Rub   Topical TID   nicotine   21 mg Transdermal Daily   pregabalin   75 mg Oral BID   sodium chloride  flush  3-10 mL Intravenous Q12H   spironolactone   12.5 mg Oral Daily   torsemide   60 mg Oral Daily    Assessment: 67 YOF w/ AF previously on Xarelto  (last dose 6/17 @ 1723) with new VT. Transition to IV heparin .   Goal of Therapy:  Heparin  level 0.3-0.7 units/ml aPTT 66-102 seconds Monitor platelets by anticoagulation protocol: Yes   Plan:  Start heparin  infusion at 1100 units/hr  at 1700 on 6/18 - check aPTT 6 hours after heparin  initiation to monitor for efficacy - anti-Xa levels will be high d/t prior Xarelto  use, continue to correlate to aPTT  Dollie Freshwater, PharmD Candidate 01/26/2024,11:14 AM

## 2024-01-26 NOTE — Progress Notes (Signed)
 HD#8 SUBJECTIVE:  Patient Summary: Autumn [name] is a 68 y.o. with a pertinent PMH of HFrEF, A-fib on Xarelto , attention deficit disorder, COPD, chronic hypoxic respiratory failure on 2 L Jeannette, anxiety and depression, fibromyalgia, HLD, and CAD , who presented with acute on chronic left hip pain and admitted for A-fib with RVR and HFrEF exacerbation.   Overnight Events and Interim History: Upon arrival to patient's room during rounds, we encountered her cardiology team who attended to sudden prolonged run of VT/VF with spontaneous return to NSR in patient moments before. Agree with plan to transfer to ICU. In visiting Autumn [name], she is understandably scared but otherwise without acute complaint.  OBJECTIVE:  Vital Signs: Vitals:   01/25/24 1944 01/25/24 2355 01/26/24 0522 01/26/24 0734  BP: 103/86 (!) 124/90 118/65 109/69  Pulse:  (!) 54 60   Resp: 17 16 17 20   Temp: 98 F (36.7 C) 97.7 F (36.5 C) 98 F (36.7 C) 98.2 F (36.8 C)  TempSrc: Oral Oral Oral Oral  SpO2: 94% 95% 98% 97%  Weight:   88.5 kg   Height:       Supplemental O2: Nasal Cannula SpO2: 97 % O2 Flow Rate (L/min): 2 L/min  Filed Weights   01/24/24 0410 01/25/24 0500 01/26/24 0522  Weight: 84.2 kg 83.6 kg 88.5 kg     Intake/Output Summary (Last 24 hours) at 01/26/2024 1011 Last data filed at 01/26/2024 1610 Gross per 24 hour  Intake 483 ml  Output 1000 ml  Net -517 ml   Net IO Since Admission: -10,369.47 mL [01/26/24 1011]  Physical Exam: Physical Exam Constitutional:      General: She is not in acute distress.    Appearance: She is not ill-appearing.     Comments: Anxious   Skin:    General: Skin is warm and dry.   Neurological:     Mental Status: She is alert. Mental status is at baseline.    Patient Lines/Drains/Airways Status     Active Line/Drains/Airways     Name Placement date Placement time Site Days   Peripheral IV 01/19/24 22 G Anterior;Right Forearm 01/19/24  0822  Forearm  7    Peripheral IV 01/25/24 22 G 1 Anterior;Left Forearm 01/25/24  0800  Forearm  1            ASSESSMENT/PLAN:  Assessment: Principal Problem:   Atrial fibrillation with RVR (HCC) Active Problems:   Polysubstance abuse (HCC)   Tobacco dependence   COPD (chronic obstructive pulmonary disease) (HCC)   Persistent atrial fibrillation (HCC)   CAD (coronary artery disease) S/P stent to LAD   Acute on chronic systolic CHF (congestive heart failure) (HCC)   Left hip pain   Anxiety state   AKI (acute kidney injury) (HCC)   Plan: Polymorphic VT/VF, Torsades de pointes Pt experienced arrhythmia moments before our team rounded on her. Fortunately, her cardiology team was outside her room and arrhythmia terminated spontaneously. It appears she had reduced consciousness during this interval but is otherwise without acute complaint. Notably, her rhythm briefly deteriorated again during out subsequent exam, and self-terminated back to NSR.  Agree with plan to transfer to ICU. I understand she will have a cath tomorrow.  [facility] is prepared to re-assume primary care once she is stable to return to the floor.  MRI with concern for diskitis L hip pain, sciatic pain  Recent MRI unable to exclude diskitis/osteomyelitis at L3/L4, but notably a noncontrast study with motion degradation and no paraspinal  inflammation. I think the significance of this finding is unclear. Her inflammatory markers are not elevated and blood cultures are without growth, albeit early. We have not started antibiotics. Anticipate a follow-up study after her pressing cardiac needs.   Acute HFrEF with EF 25-30% improved to 35-40% Atrial Fibrillation with RVR s/p Cardioversion 6/11  Volume overload Severe Mitral Regurgitation Hx COPD, Chronic hypoxic respiratory failure on 2L O2 AKI GAD/MDD  Substance use disorder CAD Medication noncompliance  Signature: Autumn [name], D.O.  Internal Medicine Resident, PGY-1 [name]  Pager: # [phone]. 10:11 AM, 01/26/2024

## 2024-01-26 NOTE — Progress Notes (Signed)
 PT Cancellation Note  Patient Details Name: Autumn [name] MRN: [id number] DOB: 07-29-1956   Cancelled Treatment:    Reason Eval/Treat Not Completed: Medical issues which prohibited therapy (Patient being transferred to ICU for medical issues. PT to continue with attempts as appropriate.)  Ozie Bo, PT, MPT   Erlene Hawks 01/26/2024, 11:15 AM

## 2024-01-26 NOTE — H&P (View-Only) (Signed)
 Had planned to start losartan  and add farxiga  today.   Will hold off for today now that LHC is planned for tomorrow.

## 2024-01-27 ENCOUNTER — Encounter (HOSPITAL_COMMUNITY)
Admission: EM | Disposition: A | Discharge status: Home Health | Payer: Self-pay | Source: Home / Self Care | Attending: Internal Medicine

## 2024-01-27 ENCOUNTER — Encounter (HOSPITAL_COMMUNITY): Payer: Self-pay | Admitting: Cardiology

## 2024-01-27 DIAGNOSIS — I472 Ventricular tachycardia, unspecified: Secondary | ICD-10-CM

## 2024-01-27 HISTORY — PX: CORONARY LITHOTRIPSY: CATH118330

## 2024-01-27 HISTORY — PX: LEFT HEART CATH AND CORONARY ANGIOGRAPHY: CATH118249

## 2024-01-27 HISTORY — PX: CORONARY ULTRASOUND/IVUS: CATH118244

## 2024-01-27 HISTORY — PX: CORONARY STENT INTERVENTION: CATH118234

## 2024-01-27 LAB — CBC
HCT: 42.8 % (ref 36.0–46.0)
Hemoglobin: 14.4 g/dL (ref 12.0–15.0)
MCH: 29.1 pg (ref 26.0–34.0)
MCHC: 33.6 g/dL (ref 30.0–36.0)
MCV: 86.5 fL (ref 80.0–100.0)
Platelets: 242 10*3/uL (ref 150–400)
RBC: 4.95 MIL/uL (ref 3.87–5.11)
RDW: 13.9 % (ref 11.5–15.5)
WBC: 7.2 10*3/uL (ref 4.0–10.5)
nRBC: 0 % (ref 0.0–0.2)

## 2024-01-27 LAB — BASIC METABOLIC PANEL WITH GFR
Anion gap: 14 (ref 5–15)
Anion gap: 13 (ref 5–15)
BUN: 34 mg/dL — ABNORMAL HIGH (ref 8–23)
BUN: 28 mg/dL — ABNORMAL HIGH (ref 8–23)
CO2: 30 mmol/L (ref 22–32)
CO2: 30 mmol/L (ref 22–32)
Calcium: 9.1 mg/dL (ref 8.9–10.3)
Calcium: 9.6 mg/dL (ref 8.9–10.3)
Chloride: 83 mmol/L — ABNORMAL LOW (ref 98–111)
Chloride: 88 mmol/L — ABNORMAL LOW (ref 98–111)
Creatinine, Ser: 1.03 mg/dL — ABNORMAL HIGH (ref 0.44–1.00)
Creatinine, Ser: 1.08 mg/dL — ABNORMAL HIGH (ref 0.44–1.00)
GFR, Estimated: 60 mL/min — ABNORMAL LOW (ref >60)
GFR, Estimated: 57 mL/min — ABNORMAL LOW (ref >60)
Glucose, Bld: 87 mg/dL (ref 70–99)
Glucose, Bld: 99 mg/dL (ref 70–99)
Potassium: 3.4 mmol/L — ABNORMAL LOW (ref 3.5–5.1)
Potassium: 3.8 mmol/L (ref 3.5–5.1)
Sodium: 127 mmol/L — ABNORMAL LOW (ref 135–145)
Sodium: 131 mmol/L — ABNORMAL LOW (ref 135–145)

## 2024-01-27 LAB — POCT ACTIVATED CLOTTING TIME
Activated Clotting Time: 406 s
Activated Clotting Time: 349 s
Activated Clotting Time: 349 s

## 2024-01-27 LAB — HEPARIN LEVEL (UNFRACTIONATED): Heparin Unfractionated: >1.1 [IU]/mL — ABNORMAL HIGH (ref 0.30–0.70)

## 2024-01-27 LAB — MAGNESIUM: Magnesium: 2.2 mg/dL (ref 1.7–2.4)

## 2024-01-27 SURGERY — LEFT HEART CATH AND CORONARY ANGIOGRAPHY
Anesthesia: LOCAL

## 2024-01-27 MED ORDER — HEPARIN SODIUM (PORCINE) 5000 UNIT/ML IJ SOLN: 5000.0000 [IU] | Freq: Three times a day (TID) | INTRAMUSCULAR | Status: DC

## 2024-01-27 MED ORDER — SODIUM CHLORIDE 0.9 % IV SOLN: 250.0000 mL | INTRAVENOUS | Status: AC | PRN

## 2024-01-27 MED ORDER — PHENYLEPHRINE 80 MCG/ML (10ML) SYRINGE FOR IV PUSH (FOR BLOOD PRESSURE SUPPORT)
PREFILLED_SYRINGE | INTRAVENOUS | Status: DC | PRN
  Administered 2024-01-27 (×2): 80 ug via INTRAVENOUS

## 2024-01-27 MED ORDER — LIDOCAINE HCL (PF) 1 % IJ SOLN
INTRAMUSCULAR | Status: AC
  Filled 2024-01-27: qty 30

## 2024-01-27 MED ORDER — SODIUM CHLORIDE 0.9 % IV SOLN: INTRAVENOUS | Status: AC

## 2024-01-27 MED ORDER — HEPARIN (PORCINE) 25000 UT/250ML-% IV SOLN
1200.0000 [IU]/h | INTRAVENOUS | Status: DC
  Administered 2024-01-27 – 2024-01-28 (×2): 1300 [IU]/h via INTRAVENOUS
  Filled 2024-01-27: qty 250

## 2024-01-27 MED ORDER — CLOPIDOGREL BISULFATE 75 MG PO TABS: 75.0000 mg | ORAL_TABLET | Freq: Every day | ORAL | Status: DC

## 2024-01-27 MED ORDER — HEPARIN SODIUM (PORCINE) 1000 UNIT/ML IJ SOLN
INTRAMUSCULAR | Status: DC | PRN
  Administered 2024-01-27 (×2): 4500 [IU] via INTRAVENOUS

## 2024-01-27 MED ORDER — SODIUM CHLORIDE 0.9 % IV SOLN
INTRAVENOUS | Status: AC | PRN
  Administered 2024-01-27 (×2): 250 mL via INTRAVENOUS

## 2024-01-27 MED ORDER — ONDANSETRON HCL 4 MG/2ML IJ SOLN: 4.0000 mg | Freq: Four times a day (QID) | INTRAMUSCULAR | Status: DC | PRN

## 2024-01-27 MED ORDER — SODIUM CHLORIDE 0.9% FLUSH
3.0000 mL | Freq: Two times a day (BID) | INTRAVENOUS | Status: DC
Start: 2024-01-27 — End: 2024-01-29
  Administered 2024-01-28 – 2024-01-29 (×3): 3 mL via INTRAVENOUS

## 2024-01-27 MED ORDER — SPIRONOLACTONE 25 MG PO TABS
25.0000 mg | ORAL_TABLET | Freq: Every day | ORAL | Status: DC
  Administered 2024-01-27 – 2024-01-29 (×3): 25 mg via ORAL
  Filled 2024-01-27 (×3): qty 1

## 2024-01-27 MED ORDER — SODIUM CHLORIDE 0.9% FLUSH: 3.0000 mL | INTRAVENOUS | Status: DC | PRN

## 2024-01-27 MED ORDER — NITROGLYCERIN 1 MG/10 ML FOR IR/CATH LAB
INTRA_ARTERIAL | Status: DC | PRN
  Administered 2024-01-27: 100 ug via INTRACORONARY

## 2024-01-27 MED ORDER — HEPARIN (PORCINE) IN NACL 1000-0.9 UT/500ML-% IV SOLN
INTRAVENOUS | Status: DC | PRN
  Administered 2024-01-27: 1500 mL via SURGICAL_CAVITY

## 2024-01-27 MED ORDER — CLOPIDOGREL BISULFATE 300 MG PO TABS
600.0000 mg | ORAL_TABLET | Freq: Once | ORAL | Status: AC
  Administered 2024-01-27: 600 mg via ORAL
  Filled 2024-01-27: qty 2

## 2024-01-27 MED ORDER — HEPARIN SODIUM (PORCINE) 1000 UNIT/ML IJ SOLN
INTRAMUSCULAR | Status: AC
  Filled 2024-01-27: qty 10

## 2024-01-27 MED ORDER — IOHEXOL 350 MG/ML SOLN
INTRAVENOUS | Status: DC | PRN
  Administered 2024-01-27: 150 mL via INTRA_ARTERIAL

## 2024-01-27 MED ORDER — VERAPAMIL HCL 2.5 MG/ML IV SOLN
INTRAVENOUS | Status: AC
  Filled 2024-01-27: qty 2

## 2024-01-27 MED ORDER — LIDOCAINE HCL (PF) 1 % IJ SOLN
INTRAMUSCULAR | Status: DC | PRN
  Administered 2024-01-27: 2 mL

## 2024-01-27 MED ORDER — AMIODARONE HCL 200 MG PO TABS
400.0000 mg | ORAL_TABLET | Freq: Every day | ORAL | Status: DC
  Filled 2024-01-27: qty 2

## 2024-01-27 MED ORDER — FENTANYL CITRATE (PF) 100 MCG/2ML IJ SOLN
INTRAMUSCULAR | Status: AC
  Filled 2024-01-27: qty 2

## 2024-01-27 MED ORDER — AMIODARONE HCL 200 MG PO TABS
200.0000 mg | ORAL_TABLET | Freq: Every day | ORAL | Status: DC
  Administered 2024-01-28 – 2024-01-29 (×2): 200 mg via ORAL
  Filled 2024-01-27 (×2): qty 1

## 2024-01-27 MED ORDER — NITROGLYCERIN 1 MG/10 ML FOR IR/CATH LAB
INTRA_ARTERIAL | Status: AC
  Filled 2024-01-27: qty 10

## 2024-01-27 MED ORDER — HYDRALAZINE HCL 20 MG/ML IJ SOLN: 10.0000 mg | INTRAMUSCULAR | Status: AC | PRN

## 2024-01-27 MED ORDER — POTASSIUM CHLORIDE CRYS ER 20 MEQ PO TBCR
60.0000 meq | EXTENDED_RELEASE_TABLET | Freq: Once | ORAL | Status: AC
  Administered 2024-01-27: 60 meq via ORAL
  Filled 2024-01-27: qty 3

## 2024-01-27 MED ORDER — TICAGRELOR 90 MG PO TABS
ORAL_TABLET | ORAL | Status: AC
  Filled 2024-01-27: qty 2

## 2024-01-27 MED ORDER — TICAGRELOR 90 MG PO TABS
ORAL_TABLET | ORAL | Status: DC | PRN
Start: 2024-01-27 — End: 2024-01-27
  Administered 2024-01-27: 180 mg via ORAL

## 2024-01-27 MED ORDER — FUROSEMIDE 10 MG/ML IJ SOLN
40.0000 mg | Freq: Once | INTRAMUSCULAR | Status: AC
  Administered 2024-01-27: 40 mg via INTRAVENOUS
  Filled 2024-01-27: qty 4

## 2024-01-27 MED ORDER — LABETALOL HCL 5 MG/ML IV SOLN: 10.0000 mg | INTRAVENOUS | Status: AC | PRN

## 2024-01-27 MED ORDER — TORSEMIDE 20 MG PO TABS
40.0000 mg | ORAL_TABLET | Freq: Every day | ORAL | Status: DC
  Administered 2024-01-27 – 2024-01-29 (×3): 40 mg via ORAL
  Filled 2024-01-27 (×3): qty 2

## 2024-01-27 MED ORDER — VERAPAMIL HCL 2.5 MG/ML IV SOLN
INTRAVENOUS | Status: DC | PRN
  Administered 2024-01-27: 10 mL via INTRA_ARTERIAL

## 2024-01-27 MED ORDER — FENTANYL CITRATE (PF) 100 MCG/2ML IJ SOLN
INTRAMUSCULAR | Status: DC | PRN
  Administered 2024-01-27: 25 ug via INTRAVENOUS

## 2024-01-27 MED ORDER — ACETAMINOPHEN 325 MG PO TABS: 650.0000 mg | ORAL_TABLET | ORAL | Status: DC | PRN

## 2024-01-27 SURGICAL SUPPLY — 24 items
BALLOON EMERGE MR 2.0X12 (BALLOONS) IMPLANT
BALLOON SAPPHIRE NC24 2.75X12 (BALLOONS) IMPLANT
BALLOON SAPPHIRE NC24 3.0X15 (BALLOONS) IMPLANT
BALLOON SAPPHIRE NC24 4.0X12 (BALLOONS) IMPLANT
BALLOON SCOREFLEX 2.50X15 (BALLOONS) IMPLANT
CATH 5FR JL3.5 JR4 ANG PIG MP (CATHETERS) IMPLANT
CATH LAUNCHER 6FR EBU 3 (CATHETERS) IMPLANT
CATH LAUNCHER 6FR EBU3.5 (CATHETERS) IMPLANT
CATH OPTICROSS HD (CATHETERS) IMPLANT
CATH SHOCKWAVE C2 3.0X12 (CATHETERS) IMPLANT
CATH SHOCKWAVE C2 4.0X12 (CATHETERS) IMPLANT
DEVICE RAD COMP TR BAND LRG (VASCULAR PRODUCTS) IMPLANT
DRAPE IVUS SLED (BAG) IMPLANT
GLIDESHEATH SLEND A-KIT 6F 22G (SHEATH) IMPLANT
GUIDEWIRE INQWIRE 1.5J.035X260 (WIRE) IMPLANT
KIT ENCORE 26 ADVANTAGE (KITS) IMPLANT
KIT HEMO VALVE WATCHDOG (MISCELLANEOUS) IMPLANT
PACK CARDIAC CATHETERIZATION (CUSTOM PROCEDURE TRAY) ×2 IMPLANT
SET ATX-X65L (MISCELLANEOUS) IMPLANT
SHEATH PROBE COVER 6X72 (BAG) IMPLANT
STENT SYNERGY XD 2.75X28 (Permanent Stent) IMPLANT
STENT SYNERGY XD 4.0X16 (Permanent Stent) IMPLANT
TUBING CIL FLEX 10 FLL-RA (TUBING) IMPLANT
WIRE RUNTHROUGH .014X180CM (WIRE) IMPLANT

## 2024-01-27 NOTE — Progress Notes (Signed)
 PT Cancellation Note  Patient Details Name: Autumn [name] MRN: [id number] DOB: [birth year]   Cancelled Treatment:    Reason Eval/Treat Not Completed: Patient at procedure or test/unavailable;Medical issues which prohibited therapy  A.M. pt gone for LHC all morning P.M. RN having trouble letting band down as pt keeps developing a hematoma  Will reattempt 6/20   [name], PT Acute Rehabilitation Services  Office [phone]  [place] Leep 01/27/2024, 2:06 PM

## 2024-01-27 NOTE — Interval H&P Note (Signed)
 History and Physical Interval Note:  01/27/2024 9:38 AM  Autumn [name]  has presented today for surgery, with the diagnosis of vt.  The various methods of treatment have been discussed with the patient and family. After consideration of risks, benefits and other options for treatment, the patient has consented to  Procedure(s): LEFT HEART CATH AND CORONARY ANGIOGRAPHY (N/A) as a surgical intervention.  The patient's history has been reviewed, patient examined, no change in status, stable for surgery.  I have reviewed the patient's chart and labs.  Questions were answered to the patient's satisfaction.     [name] J Tahjir Silveria

## 2024-01-27 NOTE — Consult Note (Signed)
 PHARMACY - ANTICOAGULATION CONSULT NOTE  Pharmacy Consult for Heparin  Initiation Indication: atrial fibrillation  Allergies  Allergen Reactions   Pamelor [Nortriptyline Hcl] Anaphylaxis   Vibra-Tab [Doxycycline] Other (See Comments)    Unknown reaction    Patient Measurements: Height: 5' 4 (162.6 cm) Weight: 87.4 kg (192 lb 10.9 oz) IBW/kg (Calculated) : 54.7 HEPARIN  DW (KG): 74.6  Vital Signs: BP: 112/62 (06/19 1800) Pulse Rate: 70 (06/19 1800)  Labs: Recent Labs    01/26/24 1355 01/26/24 2246 01/27/24 0557 01/27/24 1528  HGB  --   --  14.4  --   HCT  --   --  42.8  --   PLT  --   --  242  --   APTT  --  50*  --   --   HEPARINUNFRC  --   --  >1.10*  --   CREATININE 1.01*  --  1.03* 1.08*    Estimated Creatinine Clearance: 54.8 mL/min (A) (by C-G formula based on SCr of 1.08 mg/dL (H)).   Medical History: Past Medical History:  Diagnosis Date   Anxiety    Arthritis    Attention deficit disorder (ADD)    COPD (chronic obstructive pulmonary disease) (HCC)    Depression    Drug abuse (HCC)    Fibromyalgia    HFrEF (heart failure with reduced ejection fraction) (HCC)    Hyperlipidemia    Paroxysmal Atrial fibrillation     Medications:  Scheduled:   acetaminophen   1,000 mg Oral Q6H   [START ON 01/28/2024] amiodarone   200 mg Oral Daily   atorvastatin   80 mg Oral Daily   Chlorhexidine  Gluconate Cloth  6 each Topical Daily   clopidogrel   600 mg Oral Once   Followed by   [START ON 01/28/2024] clopidogrel   75 mg Oral Daily   DULoxetine   60 mg Oral Daily   feeding supplement  237 mL Oral BID BM   lidocaine   1 patch Transdermal Q24H   metoprolol  succinate  25 mg Oral Daily   Muscle Rub   Topical TID   nicotine   21 mg Transdermal Daily   nitroGLYCERIN        pregabalin   75 mg Oral BID   sodium chloride  flush  3 mL Intravenous Q12H   spironolactone   25 mg Oral Daily   torsemide   40 mg Oral Daily    Assessment: 67 YOF w/ AF previously on Xarelto  (last dose  6/17 @ 1723) with new VT. Transition to IV heparin .   Pt s/p LHC, pharmacy to resume heparin  2h after radial band removed (will be ~1900 per RN).  Goal of Therapy:  Heparin  level 0.3-0.7 units/ml aPTT 66-102 seconds Monitor platelets by anticoagulation protocol: Yes   Plan:  Resume heparin  1300 units/h no bolus at 2100 Check aPTT 6h after resuming  Bailyn Spackman, PharmD, BCPS, [facility] Clinical Pharmacist [phone] Please check AMION for all [facility] Pharmacy numbers 01/27/2024

## 2024-01-27 NOTE — TOC Progression Note (Signed)
 Transition of Care [facility]) - Progression Note    Patient Details  Name: Autumn [name] MRN: [id number] Date of Birth: 1956-04-01  Transition of Care [facility]) CM/SW Contact  Benjiman Bras, RN Phone Number:[phone] 01/27/2024, 1:33 PM  Clinical Narrative:      [facility] CM faxed referral for Life Vest to [facility].  Pending auth for Life Vest.   Expected Discharge Plan: Home/Self Care Barriers to Discharge: Continued Medical Work up  Expected Discharge Plan and Services       Living arrangements for the past 2 months: Single Family Home                                       Social Determinants of Health (SDOH) Interventions SDOH Screenings   Food Insecurity: No Food Insecurity (01/18/2024)  Housing: Low Risk  (01/18/2024)  Transportation Needs: No Transportation Needs (01/18/2024)  Utilities: Not At Risk (01/18/2024)  Alcohol Screen: Low Risk  (06/02/2023)  Financial Resource Strain: Low Risk  (08/19/2023)   Received from [facility]  Physical Activity: Unknown (08/19/2023)   Received from [facility]  Social Connections: Unknown (01/18/2024)  Stress: No Stress Concern Present (08/19/2023)   Received from [facility]  Tobacco Use: High Risk (01/17/2024)    Readmission Risk Interventions    01/21/2023    1:29 PM 12/07/2022    1:27 PM  Readmission Risk Prevention Plan  Transportation Screening Complete Complete  PCP or Specialist Appt within 3-5 Days  Complete  HRI or Home Care Consult  Complete  Social Work Consult for Recovery Care Planning/Counseling  Complete  Palliative Care Screening  Not Applicable  Medication Review [employer]) Complete Complete  PCP or Specialist appointment within 3-5 days of discharge Complete   HRI or Home Care Consult Complete   SW Recovery Care/Counseling Consult Complete   Palliative Care Screening Not Applicable   Skilled Nursing Facility Not Applicable

## 2024-01-27 NOTE — Progress Notes (Signed)
 Patient ID: Autumn [name], female   DOB: 17-Jan-1956, 68 y.o.   MRN: [id number]     Cardiology Rounding Note  Cardiologist: Jerryl Morin, DO  Chief Complaint: CHF  Subjective:    6/11: TEE-guided DCCV, she went back into NSR on 2nd shock. TEE showed LV EF 30-35%, moderate LV dilation, mild RV dysfunction, moderate TR, severe MR with restriction of the posterior leaflet and malcoaptation.  6/15: Diuretics stopped d/t rising Scr and hypotension. Received 500 cc bolus LR. Back in AF.  6/19: LHC with PCI today s/p DESx2  No VT overnight, however increased ectopy (PVCs and ventricular bigeminy) sCr stable.  Objective:    Weight Range: 87.4 kg Body mass index is 33.07 kg/m.   Vital Signs:   Temp:  [97.6 F (36.4 C)-98.2 F (36.8 C)] 98.1 F (36.7 C) (06/19 0509) Pulse Rate:  [51-132] 55 (06/19 0800) Resp:  [10-17] 15 (06/19 0900) BP: (89-118)/(50-87) 99/55 (06/19 0900) SpO2:  [71 %-100 %] 98 % (06/19 1018) Weight:  [87.4 kg] 87.4 kg (06/19 0500) Last BM Date : 01/24/24  Weight change: Filed Weights   01/25/24 0500 01/26/24 0522 01/27/24 0500  Weight: 83.6 kg 88.5 kg 87.4 kg   Intake/Output:  Intake/Output Summary (Last 24 hours) at 01/27/2024 1245 Last data filed at 01/27/2024 5784 Gross per 24 hour  Intake 458.88 ml  Output 1700 ml  Net -1241.12 ml    Physical Exam    General: Older than age appearing.  Cardiac: JVP flat. S1 and S2 present. No murmurs or rub. Extremities: Warm and dry.  No peripheral edema.  Neuro: Alert and oriented x3. Affect pleasant.   Telemetry   SB in 50s, frequent PVCs and ventricular bigeminy overnight (personally reviewed)  Labs    CBC Recent Labs    01/27/24 0557  WBC 7.2  HGB 14.4  HCT 42.8  MCV 86.5  PLT 242   Basic Metabolic Panel Recent Labs    69/62/95 0357 01/26/24 1355 01/27/24 0557  NA 128* 127* 127*  K 3.9 3.7 3.4*  CL 85* 84* 83*  CO2 29 29 30   GLUCOSE 94 94 87  BUN 48* 37* 34*  CREATININE 1.33* 1.01* 1.03*   CALCIUM  8.7* 9.0 9.1  MG 2.3  --  2.2   BNP (last 3 results) Recent Labs    07/02/23 1153 08/06/23 1802 01/17/24 1034  BNP 411.5* 238.2* 826.3*   Medications:    Scheduled Medications:  acetaminophen   1,000 mg Oral Q6H   amiodarone   400 mg Oral Daily   atorvastatin   80 mg Oral Daily   Chlorhexidine  Gluconate Cloth  6 each Topical Daily   clopidogrel   600 mg Oral Once   Followed by   [START ON 01/28/2024] clopidogrel   75 mg Oral Daily   DULoxetine   60 mg Oral Daily   feeding supplement  237 mL Oral BID BM   furosemide   40 mg Intravenous Once   lidocaine   1 patch Transdermal Q24H   metoprolol  succinate  25 mg Oral Daily   Muscle Rub   Topical TID   nicotine   21 mg Transdermal Daily   nitroGLYCERIN        pregabalin   75 mg Oral BID   sodium chloride  flush  3 mL Intravenous Q12H   spironolactone   25 mg Oral Daily   torsemide   40 mg Oral Daily   Infusions:  sodium chloride      sodium chloride      heparin  Stopped (01/27/24 0859)   PRN Medications: sodium  chloride, acetaminophen , hydrALAZINE , hydrOXYzine , ipratropium-albuterol , labetalol , methocarbamol , nitroGLYCERIN , ondansetron  (ZOFRAN ) IV, mouth rinse, polyethylene glycol, sodium chloride  flush, traMADol, trimethobenzamide  Assessment/Plan   1. Acute on chronic systolic CHF: Echo in 6/22 with EF 60-65%. Had multiple echoes in 2024, each with lower EF.  Echo in 10/24 with EF 30-35%, moderate-severe MR.  She has a history of CAD but denies recent chest pain. Cath 12/24 showed significant CAD but no critical lesion. TEE 12/24 with EF 25-30%, moderate RV dysfunction, moderate MR. 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  TEE yesterday showed EF 30-35%, moderate LV dilation, mild RV dysfunction, moderate TR, severe mitral regurgitation with restriction of the posterior leaflet and malcoaptation. She required milrinone  with diuresis in 12/24.   - She is readmitted in AF/RVR  (last ECG in 1/25 also showed AF) off all her medications with significant volume overload on exam.  - Decrease Torsemide  to 40 gm with LHC today (prev on 60 mg) - Hold losartan  for hypotension - Restart Farxiga  10 mg daily - Continue ToprolXL 25 mg daily - Continue spiro 25 mg daily - She is not a candidate for advanced therapies with active substance abuse and noncompliance.   2. Atrial fibrillation: Persistent, multiple admissions with AF/RVR.  Cardioverted to NSR in 12/24 but stopped amiodarone  and Xarelto  when she went home and went back into AF.  She was in AF in 1/25, no ECGs after that until this admission when she was in AF with RVR.  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  She does not tolerate AF well. She had TEE-guided DCCV 6/11 and is back in NSR.  - Back in Afib 100s-110s 6/15 afternoon. Will load with IV amiodarone  for another couple of days. Planned DCCV on 06/18 if she does not hold NSR. Back in NSR this morning.  - Continue low dose Toprol  XL 25 daily.  Holding digoxin  for now given AKI - Xarelto  on hold for LHC, resume tonight.  - Would favor eventual AF ablation.   3. 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 Moderate on TEE 12/24.  Severe mitral regurgitation on TEE 6/11 with restricted posterior leaflet and malcoaptation.  - Repeat echo 6/13 shows significant improvement in MR since she has been cardioverted and diuresed, now mild to moderate  4. CAD: DES to LAD in 9/20.  No recent chest pain. LHC 12/24 showed patent LAD stent, 75% mid LCx stenosis and heavily calcified 60% proximal RCA stenosis. Managed medically given no critical lesion and no chest pain. Troponin minimally elevated with no trend.  - LHC with PCI today s/p DES x2. Final report pending.  - Continue atorvastatin .  - No ASA with Xarelto  use.  - Start Plavix   5. COPD: She smokes and uses home oxygen  at times.  - Needs to quit smoking.   6. Back pain: Chronic  issue - MRI with severe L3-L4 and L4-L5 with severe spinal stenosis and concern for possible discitis.  - ESR and CRP okay. BC pending.  7. OSA: Suspected.  - Will need sleep study.   8. PFO: Small, noted on TEE.   9. Substance abuse: Cocaine in past though UDS did not show cocaine this admission. 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  Self-medicating for depression and back pain.  - Prev treated with Suboxone . Cymbalta  per primary.    10. AKI: Worsening renal function (0.97 > 1.21 > 1.80>1.87), suspect from overdiuresis. Resolved.   11.  Polymorphic VT/PVCs - 6/18 lost conciousness with sustained polymorphic VT, started on amio gttt - continue amio 400 mg daily - Will plan to discharge with LifeVest for prevention of SCD.  Length of Stay: 9  Can transfer to the floor after PCI.   [place] Kajal Scalici, NP  01/27/2024, 12:45 PM  Advanced Heart Failure Team Pager [phone] (M-F; 7a - 5p). For after hours see Amion for HF MD on call.

## 2024-01-27 NOTE — Progress Notes (Signed)
 OT Cancellation Note  Patient Details Name: Autumn [name] MRN: [id number] DOB: [birth year]   Cancelled Treatment:    Reason Eval/Treat Not Completed: Patient at procedure or test/ unavailable;Patient not medically ready (Patient unable to be seen earlier due to [facility]. Patient now having trouble letting band down due to patient keeps developing a hematoma)  Rosellen Lichtenberger Bennetta Braun 01/27/2024, 2:19 PM  Anitra Barn, OTA Acute Rehabilitation Services  Office [phone]

## 2024-01-27 NOTE — Progress Notes (Signed)
 MONAYE BLACKIE was recently on IMTS prior to ICU transfer s/p post PCI and now HDS. IMTS service to assume care of this patient on 01/28/2024 at 7 AM   Cathey Clunes, MD [facility] Internal Medicine Program - PGY-2 01/27/2024, 1:48 PM

## 2024-01-27 NOTE — Progress Notes (Signed)
  Patient Name: Autumn [name] Date of Encounter: 01/27/2024  Primary Cardiologist: Kardie Tobb, DO Electrophysiologist: None  Interval Summary   Just came back from cath. Very anxious.  Vital Signs    Vitals:   01/27/24 1018 01/27/24 1245 01/27/24 1300 01/27/24 1315  BP:  127/65 122/63 129/78  Pulse:  60 61   Resp:  14 18 17   Temp:      TempSrc:      SpO2: 98% 93% 95%   Weight:      Height:        Intake/Output Summary (Last 24 hours) at 01/27/2024 1349 Last data filed at 01/27/2024 1300 Gross per 24 hour  Intake 473.18 ml  Output 1700 ml  Net -1226.82 ml   Filed Weights   01/25/24 0500 01/26/24 0522 01/27/24 0500  Weight: 83.6 kg 88.5 kg 87.4 kg    Physical Exam    GEN- The patient is well appearing, alert and oriented x 3 today.   Lungs- Clear to ausculation bilaterally, normal work of breathing Cardiac- Regular rate and rhythm, no murmurs, rubs or gallops GI- soft, NT, ND, + BS Extremities- no clubbing or cyanosis. No edema  Telemetry    SB/NSR 50-60s, had occasional PVCs/couplets overnight but no further VF (personally reviewed)  Hospital Course    Autumn [name] is a 68 y.o. female with a history of systolic CHF, AF with RVR, MR, CAD, COPD, and h/o substance abuse who is being seen today for the evaluation of VF/PMVT at the request of Dr. Bensimhon.   Assessment & Plan    PMVT / VF Torsades de Pointes Potentially in the setting of bradycardia with PVCs and prolonged qt on IV amiodarone . 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  No further.  Continue po amiodarone  Repeat EKG tomorrow am.  S/p cath as below.  With concerns for discitis not currently a candidate for transvenous ICD. Will discuss with MD if Lifevest is warranted given potential reversible cause now s/p PCI   Persistent AF with RVR Remains in NSR.   Not candidate for AF ablation with other issues and non-compliance.  Was on Xarelto  > Heparin  for cath .    CAD Cath 07/2023  with patent LAD stent bu 75% LCx lesion and 60% RCA lesion S/p PTCA and stent to mid Lcx and LAD this am.    Substance Abuse Concerns for discitis by MRI Medical non-compliance She is not currently a candidate for EP procedures.     For questions or updates, please contact [facility] Please consult [email or web address] for contact info under     Signed, Tylene Galla, PA-C  01/27/2024, 1:49 PM

## 2024-01-27 NOTE — Plan of Care (Signed)

## 2024-01-28 ENCOUNTER — Encounter (HOSPITAL_COMMUNITY): Payer: Self-pay | Admitting: Internal Medicine

## 2024-01-28 ENCOUNTER — Inpatient Hospital Stay (HOSPITAL_COMMUNITY)

## 2024-01-28 DIAGNOSIS — J9611 Chronic respiratory failure with hypoxia: Secondary | ICD-10-CM

## 2024-01-28 DIAGNOSIS — I340 Nonrheumatic mitral (valve) insufficiency: Secondary | ICD-10-CM | POA: Diagnosis not present

## 2024-01-28 DIAGNOSIS — Z9981 Dependence on supplemental oxygen: Secondary | ICD-10-CM

## 2024-01-28 DIAGNOSIS — I4891 Unspecified atrial fibrillation: Secondary | ICD-10-CM | POA: Diagnosis not present

## 2024-01-28 DIAGNOSIS — I472 Ventricular tachycardia, unspecified: Secondary | ICD-10-CM | POA: Diagnosis not present

## 2024-01-28 DIAGNOSIS — I959 Hypotension, unspecified: Secondary | ICD-10-CM | POA: Diagnosis not present

## 2024-01-28 DIAGNOSIS — I5023 Acute on chronic systolic (congestive) heart failure: Secondary | ICD-10-CM | POA: Diagnosis not present

## 2024-01-28 DIAGNOSIS — I9589 Other hypotension: Secondary | ICD-10-CM | POA: Diagnosis present

## 2024-01-28 LAB — CBC
HCT: 42.2 % (ref 36.0–46.0)
Hemoglobin: 14.1 g/dL (ref 12.0–15.0)
MCH: 28.8 pg (ref 26.0–34.0)
MCHC: 33.4 g/dL (ref 30.0–36.0)
MCV: 86.3 fL (ref 80.0–100.0)
Platelets: 256 10*3/uL (ref 150–400)
RBC: 4.89 MIL/uL (ref 3.87–5.11)
RDW: 14 % (ref 11.5–15.5)
WBC: 8.4 10*3/uL (ref 4.0–10.5)
nRBC: 0 % (ref 0.0–0.2)

## 2024-01-28 LAB — BASIC METABOLIC PANEL WITH GFR
Anion gap: 11 (ref 5–15)
BUN: 27 mg/dL — ABNORMAL HIGH (ref 8–23)
CO2: 29 mmol/L (ref 22–32)
Calcium: 9.2 mg/dL (ref 8.9–10.3)
Chloride: 94 mmol/L — ABNORMAL LOW (ref 98–111)
Creatinine, Ser: 1.01 mg/dL — ABNORMAL HIGH (ref 0.44–1.00)
GFR, Estimated: >60 mL/min (ref >60)
Glucose, Bld: 91 mg/dL (ref 70–99)
Potassium: 3.6 mmol/L (ref 3.5–5.1)
Sodium: 134 mmol/L — ABNORMAL LOW (ref 135–145)

## 2024-01-28 LAB — PROTIME-INR
INR: 1.1 (ref 0.8–1.2)
Prothrombin Time: 14.2 s (ref 11.4–15.2)

## 2024-01-28 LAB — APTT
aPTT: 129 s — ABNORMAL HIGH (ref 24–36)
aPTT: 133 s — ABNORMAL HIGH (ref 24–36)

## 2024-01-28 LAB — HEPARIN LEVEL (UNFRACTIONATED): Heparin Unfractionated: 1.06 [IU]/mL — ABNORMAL HIGH (ref 0.30–0.70)

## 2024-01-28 LAB — MAGNESIUM: Magnesium: 2 mg/dL (ref 1.7–2.4)

## 2024-01-28 MED ORDER — HEPARIN (PORCINE) 25000 UT/250ML-% IV SOLN
950.0000 [IU]/h | INTRAVENOUS | Status: DC
  Administered 2024-01-28: 950 [IU]/h via INTRAVENOUS
  Filled 2024-01-28: qty 250

## 2024-01-28 MED ORDER — HEPARIN (PORCINE) 25000 UT/250ML-% IV SOLN: 1200.0000 [IU]/h | INTRAVENOUS | Status: DC

## 2024-01-28 MED ORDER — GADOBUTROL 1 MMOL/ML IV SOLN
8.0000 mL | Freq: Once | INTRAVENOUS | Status: AC | PRN
  Administered 2024-01-28: 8 mL via INTRAVENOUS

## 2024-01-28 MED ORDER — CLOPIDOGREL BISULFATE 75 MG PO TABS
75.0000 mg | ORAL_TABLET | Freq: Every day | ORAL | Status: DC
  Administered 2024-01-28 – 2024-01-29 (×2): 75 mg via ORAL
  Filled 2024-01-28 (×2): qty 1

## 2024-01-28 MED ORDER — DAPAGLIFLOZIN PROPANEDIOL 10 MG PO TABS
10.0000 mg | ORAL_TABLET | Freq: Every day | ORAL | Status: DC
  Administered 2024-01-28 – 2024-01-29 (×2): 10 mg via ORAL
  Filled 2024-01-28 (×2): qty 1

## 2024-01-28 MED ORDER — ASPIRIN 81 MG PO TBEC
81.0000 mg | DELAYED_RELEASE_TABLET | Freq: Every day | ORAL | Status: DC
  Administered 2024-01-28 – 2024-01-29 (×2): 81 mg via ORAL
  Filled 2024-01-28 (×2): qty 1

## 2024-01-28 MED ORDER — POTASSIUM CHLORIDE 20 MEQ PO PACK
40.0000 meq | PACK | Freq: Once | ORAL | Status: AC
  Administered 2024-01-28: 40 meq via ORAL
  Filled 2024-01-28: qty 2

## 2024-01-28 NOTE — Progress Notes (Signed)
 PHARMACY - ANTICOAGULATION CONSULT NOTE  Pharmacy Consult for heparin  Indication: atrial fibrillation  Labs: Recent Labs    01/26/24 2246 01/27/24 0557 01/27/24 1528 01/28/24 0331  HGB  --  14.4  --  14.1  HCT  --  42.8  --  42.2  PLT  --  242  --  256  APTT 50*  --   --  129*  LABPROT  --   --   --  14.2  INR  --   --   --  1.1  HEPARINUNFRC  --  >1.10*  --  1.06*  CREATININE  --  1.03* 1.08* 1.01*   Assessment: 67yo female supratherapeutic on heparin  after resuming; no infusion issues or signs of bleeding per RN but pt is restricted and lab had to be drawn from below IV site (IV in forearm, drawn from hand), could affect results.  Goal of Therapy:  aPTT 66-102 seconds   Plan:  Decrease heparin  infusion by 1-2 units/kg/hr to 1200 units/hr. Check PTT in 6 hours.   Lonnie Roberts, PharmD, BCPS 01/28/2024 5:27 AM

## 2024-01-28 NOTE — Progress Notes (Addendum)
 Patient ID: Autumn [name], female   DOB: [birth year], 68 y.o.   MRN: [id number]     Cardiology Rounding Note  Cardiologist: Jerryl Morin, DO  Chief Complaint: CHF  Subjective:    6/11: TEE-guided DCCV, went back into NSR on 2nd shock. TEE showed LV EF 30-35%, mild RV dysfunction, moderate TR, severe MR  6/15: Diuretics stopped d/t rising Scr and hypotension. Received 500 cc bolus LR. Back in AF.  6/18: Polymorphic VT 6/19: PCI to [facility] and mLAD  Feeling well. No dyspnea or chest pain.    Objective:    Weight Range: 80.1 kg Body mass index is 30.31 kg/m.   Vital Signs:   Temp:  [97.6 F (36.4 C)-98.1 F (36.7 C)] 97.6 F (36.4 C) (06/20 0619) Pulse Rate:  [55-76] 62 (06/20 0619) Resp:  [10-27] 16 (06/20 0619) BP: (81-150)/(45-113) 105/59 (06/20 0619) SpO2:  [82 %-100 %] 100 % (06/20 0619) Weight:  [80.1 kg-85.5 kg] 80.1 kg (06/20 0619) Last BM Date : 01/24/24  Weight change: Filed Weights   01/27/24 0500 01/28/24 0339 01/28/24 0619  Weight: 87.4 kg 85.5 kg 80.1 kg   Intake/Output:  Intake/Output Summary (Last 24 hours) at 01/28/2024 0700 Last data filed at 01/28/2024 0600 Gross per 24 hour  Intake 885.97 ml  Output 2900 ml  Net -2014.03 ml    Physical Exam    General:  Lying comfortably in bed. Appears older than stated age. Neck: no JVD.  Cor: Regular rate & rhythm. No rubs, gallops or murmurs. Lungs: clear Abdomen: soft, nontender, nondistended.  Extremities: no edema, no hematoma at R radial site Neuro: alert & orientedx3. Affect pleasant   Telemetry   SR/SB 50s-60s, less ectopy than days prior  Labs    CBC Recent Labs    01/27/24 0557 01/28/24 0331  WBC 7.2 8.4  HGB 14.4 14.1  HCT 42.8 42.2  MCV 86.5 86.3  PLT 242 256   Basic Metabolic Panel Recent Labs    65/78/46 0357 01/26/24 1355 01/27/24 0557 01/27/24 1528 01/28/24 0331  NA 128*   < > 127* 131* 134*  K 3.9   < > 3.4* 3.8 3.6  CL 85*   < > 83* 88* 94*  CO2 29   < > 30 30 29    GLUCOSE 94   < > 87 99 91  BUN 48*   < > 34* 28* 27*  CREATININE 1.33*   < > 1.03* 1.08* 1.01*  CALCIUM  8.7*   < > 9.1 9.6 9.2  MG 2.3  --  2.2  --   --    < > = values in this interval not displayed.   BNP (last 3 results) Recent Labs    07/02/23 1153 08/06/23 1802 01/17/24 1034  BNP 411.5* 238.2* 826.3*   Medications:    Scheduled Medications:  acetaminophen   1,000 mg Oral Q6H   amiodarone   200 mg Oral Daily   atorvastatin   80 mg Oral Daily   Chlorhexidine  Gluconate Cloth  6 each Topical Daily   clopidogrel   75 mg Oral Daily   DULoxetine   60 mg Oral Daily   feeding supplement  237 mL Oral BID BM   lidocaine   1 patch Transdermal Q24H   metoprolol  succinate  25 mg Oral Daily   Muscle Rub   Topical TID   nicotine   21 mg Transdermal Daily   pregabalin   75 mg Oral BID   sodium chloride  flush  3 mL Intravenous Q12H   spironolactone   25  mg Oral Daily   torsemide   40 mg Oral Daily   Infusions:  sodium chloride      heparin  1,200 Units/hr (01/28/24 0529)   PRN Medications: sodium chloride , acetaminophen , hydrOXYzine , ipratropium-albuterol , methocarbamol , ondansetron  (ZOFRAN ) IV, mouth rinse, polyethylene glycol, sodium chloride  flush, traMADol, trimethobenzamide  Assessment/Plan   1. Acute on chronic systolic CHF: Echo in 6/22 with EF 60-65%. Had multiple echoes in 2024, each with lower EF.  Echo in 10/24 with EF 30-35%, moderate-severe MR.  She has a history of CAD but denies recent chest pain. Cath 12/24 showed significant CAD but no critical lesion. TEE 12/24 with EF 25-30%, moderate RV dysfunction, moderate MR. 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  TEE yesterday showed EF 30-35%, moderate LV dilation, mild RV dysfunction, moderate TR, severe mitral regurgitation with restriction of the posterior leaflet and malcoaptation. She required milrinone  with diuresis in 12/24.   - She is readmitted in AF/RVR (last ECG in  1/25 also showed AF) off all her medications with significant volume overload on exam. Aggressively diuresed. - Continue Torsemide  40 mg daily. LVEDP 10 in cath lab yesterday.  - BP too soft for ARB/ARNi - Restart Farxiga  10 mg daily - Continue ToprolXL 25 mg daily - Continue spiro 25 mg daily - She is not a candidate for advanced therapies with active substance abuse and noncompliance.   2. Atrial fibrillation: Persistent, multiple admissions with AF/RVR.  Cardioverted to NSR in 12/24 but stopped amiodarone  and Xarelto  when she went home and went back into AF.  She was in AF in 1/25, no ECGs after that until this admission when she was in AF with RVR.  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  She does not tolerate AF well. She had TEE-guided DCCV 6/11 and is back in NSR.  - Back in Afib 100s-110s 6/15 afternoon. Converted to SR with IV amiodarone , now on PO. - Continue low dose Toprol  XL 25 daily.   - Switch heparin  gtt to Xarelto  20 mg daily - Would favor eventual AF ablation.   3. 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 Moderate on TEE 12/24.  Severe mitral regurgitation on TEE 6/11 with restricted posterior leaflet and malcoaptation.  - Repeat echo 6/13 shows significant improvement in MR since she has been cardioverted and diuresed, now mild to moderate  4. CAD: DES to LAD in 9/20.   - Had polymorphic VT 06/18. 06/19 had PCI with PTCA/DES to [facility] and PTCA/DES mLAD - Continue atorvastatin  80.  - Aspirin  X 1 month, then discontinue since she will be on Xarelto ?. Continue plavix  75 mg daily  5. COPD: She smokes and uses home oxygen  at times.  - Needs to quit smoking.   6. Back pain: Chronic issue - MRI with severe L3-L4 and L4-L5 with severe spinal stenosis and concern for possible discitis.  - ESR and CRP okay. BC no growth and no leukocytosis. Less suspicion for infection. Consider repeat MRI in 2-4 weeks.   7. OSA: Suspected.  - Will need  sleep study.   8. PFO: Small, noted on TEE.   9. Substance abuse: Cocaine in past though UDS did not show cocaine this admission. 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  Self-medicating for depression and back pain.  - Prev treated with Suboxone . Cymbalta  per primary.    10. AKI: Suspect from overdiuresis. Resolved.   11. Polymorphic VT/PVCs - 6/18 lost conciousness with sustained polymorphic VT.  Felt to be possibly d/t ischemia +/- combination of prolonged QT/sinus brady/PVCs. Now s/p PCI. - Seems to have less PVCs today - Discharge with LifeVest. Not a candidate for device currently with question of discitis. - Continue po amiodarone   Length of Stay: 10   FINCH, LINDSAY N, PA-C  01/28/2024, 7:00 AM  Advanced Heart Failure Team Pager [phone] (M-F; 7a - 5p). For after hours see Amion for HF MD on call.   Patient seen and examined with the above-signed Advanced Practice Provider and/or Housestaff. I personally reviewed laboratory data, imaging studies and relevant notes. I independently examined the patient and formulated the important aspects of the plan. I have edited the note to reflect any of my changes or salient points. I have personally discussed the plan with the patient and/or family.  Underwent PCI/DES of LAC and LCx yesterday. Feels ok. No CP   Remains in NSR on po amio. No further VT/torsades  Sleepy this am. Denies CP or SOB   General: Lying in bed No resp difficulty HEENT: normal Neck: supple. no JVD. Carotids 2+ bilat; no bruits. No lymphadenopathy or thryomegaly appreciated. Cor: PMI nondisplaced. Regular rate & rhythm. No rubs, gallops or murmurs. Lungs: decreased  Abdomen: soft, nontender, nondistended. No hepatosplenomegaly. No bruits or masses. Good bowel sounds. Extremities: no cyanosis, clubbing, rash, edema Neuro: alert & orientedx3, cranial nerves grossly intact. moves all 4 extremities w/o difficulty. Affect pleasant  She  is s/p 2v PCI. LVEDP 10  Suspect VT/torsades related mostly to QT prolongation and possible ischemia. EP has seen and recommended keeping amio at low dose (200 mg daily). Will need to watch for AF return  Not ICD candidate per EP (not AF ablation candidate currently either). Will need LifeVest  Continue current GDMT and toresmide at 40  Would use Plavix /ASA/DOAC for 30 days (Ideally switch Xarelto  to Eliquis  due to lower bleeding risk). After 30 days drop ASA and use Eliquis /Plavix  x 1 year. Then Eliquis  only.   AHF team will s/o.   Please call with questions.   Jules Oar, MD  9:20 AM

## 2024-01-28 NOTE — Progress Notes (Signed)
 Occupational Therapy Treatment Patient Details Name: Autumn [name] MRN: [id number] DOB: 20-Jun-1956 Today's Date: 01/28/2024   History of present illness Pt is a 68 y.o. female who presented 01/17/24 with acute on chronic L hip pain and was admitted for AF with RVR and HFrEF exacerbation. UDS positive for benzodiazepines, amphetamines, and THC. Imaging of L hip showed no acute osseous abnormality. S/p cardioversion 6/11;  6/19 LHC with stents. PMH of afib, drug abuse, parkinson's disease, COPD, ADD, fibromyalgia, HLD, and HFrEF   OT comments  Pt progressing well towards goals. Progressed to completing LB dressing with supervision. Pt agitated and highly impulsive. Despite multiple cues from OT regarding management of IV, pt attempting to stand and mobilize without AD. Educational handout provided and reviewed on energy conservation and fall prevention strategies. Continue to recommend No follow up OT . Will continue to follow acutely.       If plan is discharge home, recommend the following:  A little help with walking and/or transfers;A little help with bathing/dressing/bathroom   Equipment Recommendations  None recommended by OT    Recommendations for Other Services      Precautions / Restrictions Precautions Precautions: Fall Recall of Precautions/Restrictions: Impaired       Mobility Bed Mobility Overal bed mobility: Needs Assistance Bed Mobility: Supine to Sit, Sit to Supine     Supine to sit: Supervision Sit to supine: Supervision   General bed mobility comments: Supervision d/t safety with lines    Transfers Overall transfer level: Needs assistance Equipment used: Rolling walker (2 wheels) Transfers: Sit to/from Stand Sit to Stand: Supervision       General transfer comment: Supervision for safety     Balance Overall balance assessment: Needs assistance Sitting-balance support: No upper extremity supported, Feet supported Sitting balance-Leahy Scale: Good      Standing balance support: No upper extremity supported, During functional activity, Reliant on assistive device for balance Standing balance-Leahy Scale: Poor Standing balance comment: reliant on AD or support         ADL either performed or assessed with clinical judgement   ADL Overall ADL's : Needs assistance/impaired         Lower Body Dressing: Supervision/safety;Sit to/from stand Lower Body Dressing Details (indicate cue type and reason): Cues for safety with RW     General ADL Comments: Pt with poor safety awareness, impulsive, despite cues from OT about safety with IV    Extremity/Trunk Assessment Upper Extremity Assessment Upper Extremity Assessment: Generalized weakness   Lower Extremity Assessment Lower Extremity Assessment: Defer to PT evaluation        Vision   Vision Assessment?: No apparent visual deficits         Communication Communication Communication: Impaired Factors Affecting Communication: Reduced clarity of speech   Cognition Arousal: Alert Behavior During Therapy: Impulsive, Agitated Cognition: Cognition impaired     Awareness: Intellectual awareness intact, Online awareness impaired   Attention impairment (select first level of impairment): Selective attention Executive functioning impairment (select all impairments): Reasoning, Problem solving OT - Cognition Comments: Complains of back pain with patient impulsive at time       Following commands: Intact Following commands impaired: Follows multi-step commands with increased time      Cueing   Cueing Techniques: Verbal cues        General Comments Educated pt on fall prevention and enrgy conservation handouts    Pertinent Vitals/ Pain       Pain Assessment Pain Assessment: Faces Faces Pain Scale: Hurts  whole lot Pain Location: Back and LEs Pain Descriptors / Indicators: Discomfort, Grimacing, Burning Pain Intervention(s): Limited activity within patient's  tolerance   Frequency  Min 2X/week        Progress Toward Goals  OT Goals(current goals can now be found in the care plan section)  Progress towards OT goals: Progressing toward goals  Acute Rehab OT Goals Patient Stated Goal: To rest OT Goal Formulation: With patient Time For Goal Achievement: 02/03/24 Potential to Achieve Goals: Good ADL Goals Pt Will Perform Grooming: with modified independence;standing Pt Will Perform Lower Body Dressing: with modified independence;sit to/from stand Pt Will Transfer to Toilet: with modified independence;ambulating Pt Will Perform Toileting - Clothing Manipulation and hygiene: with modified independence;sit to/from stand Additional ADL Goal #1: Pt will verbalize at least 3 energy conservation strategies to prevent COPD exacerbation and promote safety upon d/c.  Plan         AM-PAC OT 6 Clicks Daily Activity     Outcome Measure   Help from another person eating meals?: None Help from another person taking care of personal grooming?: A Little Help from another person toileting, which includes using toliet, bedpan, or urinal?: A Little Help from another person bathing (including washing, rinsing, drying)?: A Little Help from another person to put on and taking off regular upper body clothing?: A Little Help from another person to put on and taking off regular lower body clothing?: A Little 6 Click Score: 19    End of Session Equipment Utilized During Treatment: Gait belt  OT Visit Diagnosis: Unsteadiness on feet (R26.81);Other abnormalities of gait and mobility (R26.89)   Activity Tolerance Patient limited by pain   Patient Left in bed;with call bell/phone within reach   Nurse Communication Mobility status        Time: 9629-5284 OT Time Calculation (min): 11 min  Charges: OT General Charges $OT Visit: 1 Visit OT Treatments $Self Care/Home Management : 8-22 mins  Delmer Ferraris, OT  Acute Rehabilitation Services Office  [phone] Secure chat preferred   Mickael Alamo 01/28/2024, 3:12 PM

## 2024-01-28 NOTE — Progress Notes (Signed)
  No further arrhythmia.   EP will see as needed while remains here.   Agree with LifeVest, but currently a poor candidate for ICD with active substance abuse.  Hopeful that she will not have further arrhythmia with PCI and if she can remain abstinent from from substance abuse.   Merla Starch, PA-C  01/28/2024 8:42 AM

## 2024-01-28 NOTE — Progress Notes (Addendum)
 HD#10 SUBJECTIVE:  Patient Summary: Autumn [name] is a 68 y.o. woman with a pertinent PMH of HFrEF, A-fib on Xarelto , attention deficit disorder, COPD, chronic hypoxic respiratory failure on 2 L [place], anxiety and depression, fibromyalgia, HLD, and CAD.   She presented with acute on chronic left hip pain and admitted for A-fib with RVR and HFrEF exacerbation with hospital course complicated by cardiac rhythm instability (self-terminating VT) requiring two nights ICU care and is S/P PCI to Lcx and LAD.  Overnight Events and Interim History: Returned to floor care. She wants to talk about her back pain which remains worse with movement. She understands her heart conditions. She is agreeable to a repeat lumbar MRI.  Significant Hospital Events:  6/11 DCCV/TEE 6/15 back to Afib, not RVR 6/18-6/19 ICU stay due to unstable rhythm (polymorphic VT) 6/19 PCI with stent Lcx/LAD  OBJECTIVE:  Vital Signs: Vitals:   01/28/24 0708 01/28/24 0724 01/28/24 1029 01/28/24 1058  BP: (!) 88/48 (!) 97/56 105/86 110/69  Pulse: (!) 59 (!) 57 73 70  Resp: 16 18  14   Temp: (!) 97.1 F (36.2 C)   (!) 97.4 F (36.3 C)  TempSrc: Axillary   Oral  SpO2: 96% 96%  100%  Weight:      Height:       Supplemental O2: Room Air  Filed Weights   01/27/24 0500 01/28/24 0339 01/28/24 0619  Weight: 87.4 kg 85.5 kg 80.1 kg     Intake/Output Summary (Last 24 hours) at 01/28/2024 1338 Last data filed at 01/28/2024 0600 Gross per 24 hour  Intake 300 ml  Output 2900 ml  Net -2600 ml   Net IO Since Admission: -13,123.6 mL [01/28/24 1338]  Physical Exam: Physical Exam Constitutional:      General: She is not in acute distress.    Appearance: She is not ill-appearing.   Cardiovascular:     Rate and Rhythm: Normal rate and regular rhythm.     Pulses: Normal pulses.  Pulmonary:     Effort: Pulmonary effort is normal.     Breath sounds: Normal breath sounds. No wheezing or rales.   Musculoskeletal:     Right  lower leg: No edema.     Left lower leg: No edema.     Comments: Some bruising R arm after yesterday's procedure   Skin:    General: Skin is warm and dry.   Neurological:     General: No focal deficit present.     Mental Status: She is alert. Mental status is at baseline.   Psychiatric:        Mood and Affect: Mood normal.        Behavior: Behavior normal.    Patient Lines/Drains/Airways Status     Active Line/Drains/Airways     Name Placement date Placement time Site Days   Peripheral IV 01/25/24 22 G 1 Anterior;Left Forearm 01/25/24  0800  Forearm  3   External Urinary Catheter 01/26/24  1100  --  2            ASSESSMENT/PLAN:  Assessment: Principal Problem:   Acute on chronic systolic CHF (congestive heart failure) (HCC) Active Problems:   Persistent atrial fibrillation (HCC)   S/P primary angioplasty with coronary stent; mid LAD and LCX   Chronic left-sided low back pain   Paroxysmal ventricular tachycardia (HCC)   History of substance use disorder   COPD (chronic obstructive pulmonary disease) (HCC)   QT prolongation   Anxiety state   AKI (  acute kidney injury) (HCC)   Chronic hypoxic respiratory failure, on home oxygen  therapy (HCC)   Chronic low blood pressure  Autumn [name] is a 68 y.o. woman with a pertinent PMH of HFrEF, A-fib on Xarelto , attention deficit disorder, COPD, chronic hypoxic respiratory failure on 2 L [place], anxiety and depression, fibromyalgia, HLD, and CAD.   She presented with acute on chronic left hip pain and admitted for A-fib with RVR and HFrEF exacerbation with hospital course complicated by cardiac rhythm instability (self-terminating VT) requiring two nights ICU care and is S/P PCI to Lcx and LAD.  Plan: CAD S/P PCI stenting of LCX and LAD HFrEF with EF 30-35% Low blood pressure limiting GDMT Mitral Regurgitation Hx COPD, Chronic hypoxic respiratory failure on 2L O2 Cardiology consulting, thank you. Fluctuates room air to 4L, no  recorded hypoxia and no dyspnea. Cr to near baseline after AKI last week from hypoperfusion, med titration. Appears euvolemic. - Oral diuresis torsemide  40 daily. - GDMT Torprol 25mg  daily, Spiro 25 daily, Farxiga  10 every day. Do not expect ARB/ARNI with current blood pressure.  - Anticipate ASA/Plavix /Eliquis  x 1 month, then Plavix /Eliquis  x 1 year - Strict ins and outs, daily weights. - DuoNebs every 6 as needed - Recs per cardiology   Arrhythmias Atrial Fibrillation with RVR s/p Cardioversion 6/11 now NSR Polymorphic VT requiring ICU care now resolved Long Qt Cardiology consulting, thank you. NSR today and on oral amiodarone . Good to see her out of the ICU with stable rhythms. Overall, suspect tachyarrhythmia drives her cardiomyopathy - and med adherence out of hospital is poor - favor eventual AF ablation. - Continue anticoagulation per above - Continue toprol  XL 25 daily - Amiodarone  200 every day - EKG pending - Avoid QTC prolongers - Recs per cardiology   L hip pain, sciatic pain MRI with concern of discitis osteomyelitis L3/L4 This is why she came sought ER evaluation. Sequela of a fall 2 months prior. Recent MRI with severe degenerative disease but also a concern of vertebral diskitis/osteomyelitis - albeit a limited study so unable to exclude - blood Cx and inflammatory markers are negative. Will attempt to clarify with another study. - MRI w/wo today. - Avoid narcotic pain medicine - Tylenol  1000 QID - Cont lyrica  and Cymbalta  per below - Lidocaine  patch, heating pad prn - Robaxin  750mg  - Working with PT/OT - rec HHPT   GAD/MDD Report of hallucination Anxiety waxes and wanes daily, but severe and tearful at times. Usually redirectable. Shares that she takes unprescribed Xanax  due to this. Feels that most treatment per doctors in past not helpful. Prescribed aripiprazole  in past but not filled since 2024. Questionable med adherence.  I will monitor this report of a  hallucination, she is always appropriately oriented with me making me doubt delirium. If recurrent, would consider a psych consult. - Cont cymbalta  60mg ,  Hope this will have added benefit to help sciatica. - Lyrica  75 BID.  - Hydroxyzine  TID prn    Substance use disorder Remains AOx3. Hypersomnolent at admission, likely a drug effect. On suboxone  in past. - Avoid sedating medications including narcotic pain medicine  - Rec abstinence / TOC for resources OSA COPD on 2L at baseline not in exacerbation Diagnosed but does not wear CPAP. - rec outpatient pulm eval Medication noncompliance Poor adherence out of hospital.  She does not give a clear reason why.  She cannot confirm her medication list.  Control of cardiovascular/pain/mood disease difficult due to this. Cigarette smoker 1 ppd. Will  give a nicotine  patch. She should quit.  Best Practice: Diet: Cardiac diet VTE: Heparin  -> DOAC Code: Full Therapy Recs: Home Health, DME: none DISPO: Anticipated discharge in 1-3 days to Home.  Signature: Carleen Chary, D.O.  Internal Medicine Resident, PGY-1 [name] Res[facility]  Pager: # [phone]. 1:38 PM, 01/28/2024

## 2024-01-28 NOTE — TOC Progression Note (Addendum)
 Transition of Care [facility]) - Progression Note    Patient Details  Name: Autumn [name] MRN: [id number] Date of Birth: [birth year]  [facility] CM/SW Contact  Benjiman Bras, RN Phone Number: [phone] 01/28/2024, 12:11 PM  Clinical Narrative:     TOC CM spoke to pt and offered choice for [facility], Pt agreeable to [facility] for [facility]. Medicare.gov list with rating placed on chart. Pt states she lives with grandson.  Has Rollator and oxygen  at home.  Requested RW. Contacted [facility] for RW.  Contacted [facility] rep, Engineer, materials for [facility]. Accepted referral. HH orders in EPIC.  PCP appt scheduled.   Daughter will pick up scale for daily rates. Provided pt with Living Better with HF book.  Discussed smoking cessation program, to speak to her PCP. Explained to pt that she cannot smoke at home with oxygen . Verbalized understanding.   Expected Discharge Plan: Home/Self Care Barriers to Discharge: No Barriers Identified  Expected Discharge Plan and Services   Discharge Planning Services: CM Consult Post Acute Care Choice: Home Health Living arrangements for the past 2 months: Single Family Home                 DME Arranged: Walker rolling DME Agency: [facility] Date DME Agency Contacted: 01/28/24 Time DME Agency Contacted: 1201 Representative spoke with at DME Agency: [facility] HH Arranged: PT, RN HH Agency: [facility] Date HH Agency Contacted: 01/28/24 Time HH Agency Contacted: 1201 Representative spoke with at [facility] Agency: Senaida Dama   Social Determinants of Health (SDOH) Interventions SDOH Screenings   Food Insecurity: No Food Insecurity (01/18/2024)  Housing: Low Risk  (01/18/2024)  Transportation Needs: No Transportation Needs (01/18/2024)  Utilities: Not At Risk (01/18/2024)  Alcohol Screen: Low Risk  (06/02/2023)  Financial Resource Strain: Low Risk  (08/19/2023)   Received from [facility]  Physical Activity: Unknown (08/19/2023)   Received from  [facility]  Social Connections: Unknown (01/18/2024)  Stress: No Stress Concern Present (08/19/2023)   Received from [facility]  Tobacco Use: High Risk (01/17/2024)    Readmission Risk Interventions    01/21/2023    1:29 PM 12/07/2022    1:27 PM  Readmission Risk Prevention Plan  Transportation Screening Complete Complete  PCP or Specialist Appt within 3-5 Days  Complete  HRI or Home Care Consult  Complete  Social Work Consult for Recovery Care Planning/Counseling  Complete  Palliative Care Screening  Not Applicable  Medication Review [employer]) Complete Complete  PCP or Specialist appointment within 3-5 days of discharge Complete   HRI or Home Care Consult Complete   SW Recovery Care/Counseling Consult Complete   Palliative Care Screening Not Applicable   Skilled Nursing Facility Not Applicable

## 2024-01-28 NOTE — Progress Notes (Signed)
 Physical Therapy Treatment Patient Details Name: Autumn [name] MRN: [id number] DOB: [birth year] Today's Date: 01/28/2024   History of Present Illness Pt is a 68 y.o. female who presented 01/17/24 with acute on chronic L hip pain and was admitted for AF with RVR and HFrEF exacerbation. UDS positive for benzodiazepines, amphetamines, and THC. Imaging of L hip showed no acute osseous abnormality. S/p cardioversion 6/11;  6/19 LHC with stents. PMH of afib, drug abuse, parkinson's disease, COPD, ADD, fibromyalgia, HLD, and HFrEF    PT Comments  Pt has progress to a point close to meeting goals.  Still limited by pain to doing a lot of any mobility at one time.  Emphasis today on unassisted transitions in/out of bed without rails, transfers over a few feet with a cane, ambulation up to 60 feet with a rollator/RW and negotiated 3 steps safely.  Pt's pain was 7/10 after gait and 1-2 pt's higher after completing the stairs.  Pt's granddaughter whose home pt lives in states that there are 6 adults and 3 children in the home and pt is virtually never alone and has sufficient assist to do ADL's.  Both pt and grand daughter feel that pt can definitely handle the level of mobility needed at home.  No DME needed, but therapy, HHPT would be beneficial.    If plan is discharge home, recommend the following: A little help with walking and/or transfers;A little help with bathing/dressing/bathroom;Assistance with cooking/housework;Assist for transportation;Direct supervision/assist for medications management;Direct supervision/assist for financial management;Help with stairs or ramp for entrance   Can travel by private vehicle        Equipment Recommendations   (received the RW)    Recommendations for Other Services       Precautions / Restrictions Precautions Precautions: Fall Recall of Precautions/Restrictions: Impaired     Mobility  Bed Mobility   Bed Mobility: Supine to Sit, Sit to Supine     Supine  to sit: Modified independent (Device/Increase time) Sit to supine: Modified independent (Device/Increase time)        Transfers Overall transfer level: Needs assistance Equipment used: None, Rollator (4 wheels) Transfers: Sit to/from Stand Sit to Stand: Supervision   Step pivot transfers: Supervision       General transfer comment: cues for hand placement    Ambulation/Gait Ambulation/Gait assistance: Contact guard assist Gait Distance (Feet): 50 Feet Assistive device: Rollator (4 wheels) Gait Pattern/deviations: Step-through pattern, Decreased stride length   Gait velocity interpretation: <1.31 ft/sec, indicative of household ambulator   General Gait Details: generally steady and safe with RW or rollator for short distances where back pain is not so intense.  Cane use safe over 10-15 feet.   Stairs Stairs: Yes Stairs assistance: Contact guard assist Stair Management: One rail Right, With cane Number of Stairs: 3 General stair comments: limited by lines to 3 steps and need for close guard.   Wheelchair Mobility     Tilt Bed    Modified Rankin (Stroke Patients Only)       Balance Overall balance assessment: Needs assistance   Sitting balance-Leahy Scale: Good     Standing balance support: Single extremity supported, Bilateral upper extremity supported, During functional activity, Reliant on assistive device for balance Standing balance-Leahy Scale: Poor Standing balance comment: reliant on AD or support                            Communication Communication Communication: Impaired Factors Affecting Communication: Reduced  clarity of speech  Cognition Arousal: Alert Behavior During Therapy: WFL for tasks assessed/performed   PT - Cognitive impairments: No family/caregiver present to determine baseline (Functional for working on discharge needs with family present.)                         Following commands: Intact Following  commands impaired: Follows multi-step commands with increased time    Cueing Cueing Techniques: Verbal cues  Exercises      General Comments General comments (skin integrity, edema, etc.): Discussed DME needs and grandaughter states that just got RW and have a 3 in 1.  They feel this is enough presently      Pertinent Vitals/Pain Pain Assessment Pain Assessment: 0-10 Pain Score: 7  (and higher after stairs.) Pain Location: Back and LEs Pain Descriptors / Indicators: Discomfort, Grimacing, Burning Pain Intervention(s): Monitored during session    Home Living                          Prior Function            PT Goals (current goals can now be found in the care plan section) Acute Rehab PT Goals PT Goal Formulation: With patient Time For Goal Achievement: 02/02/24 Potential to Achieve Goals: Fair Progress towards PT goals: Progressing toward goals    Frequency    Min 2X/week      PT Plan      Co-evaluation              AM-PAC PT 6 Clicks Mobility   Outcome Measure  Help needed turning from your back to your side while in a flat bed without using bedrails?: None Help needed moving from lying on your back to sitting on the side of a flat bed without using bedrails?: A Little Help needed moving to and from a bed to a chair (including a wheelchair)?: A Little Help needed standing up from a chair using your arms (e.g., wheelchair or bedside chair)?: A Little Help needed to walk in hospital room?: A Little Help needed climbing 3-5 steps with a railing? : A Little 6 Click Score: 19    End of Session Equipment Utilized During Treatment: Oxygen  Activity Tolerance: Patient tolerated treatment well;Patient limited by pain Patient left: in bed;with call bell/phone within reach;with bed alarm set Nurse Communication: Mobility status PT Visit Diagnosis: Other abnormalities of gait and mobility (R26.89);Muscle weakness (generalized) (M62.81);Difficulty in  walking, not elsewhere classified (R26.2) Pain - part of body:  (back and legs)     Time: 4098-1191 PT Time Calculation (min) (ACUTE ONLY): 47 min  Charges:    $Gait Training: 8-22 mins $Therapeutic Activity: 23-37 mins PT General Charges $$ ACUTE PT VISIT: 1 Visit                     01/28/2024  Nohemi Batters., PT Acute Rehabilitation Services [phone]  (office)   Durell Gilding Willella Harding 01/28/2024, 2:42 PM

## 2024-01-28 NOTE — Consult Note (Signed)
 PHARMACY - ANTICOAGULATION CONSULT NOTE  Pharmacy Consult for Heparin  Initiation Indication: atrial fibrillation  Allergies  Allergen Reactions   Pamelor [Nortriptyline Hcl] Anaphylaxis   Vibra-Tab [Doxycycline] Other (See Comments)    Unknown reaction    Patient Measurements: Height: 5' 4 (162.6 cm) Weight: 80.1 kg (176 lb 9.4 oz) IBW/kg (Calculated) : 54.7 HEPARIN  DW (KG): 74.6 kg  Vital Signs: Temp: 97.8 F (36.6 C) (06/20 1945) Temp Source: Oral (06/20 1945) BP: 96/50 (06/20 1945) Pulse Rate: 62 (06/20 1945)  Labs: Recent Labs    01/26/24 2246 01/27/24 0557 01/27/24 1528 01/28/24 0331 01/28/24 1607  HGB  --  14.4  --  14.1  --   HCT  --  42.8  --  42.2  --   PLT  --  242  --  256  --   APTT 50*  --   --  129* 133*  LABPROT  --   --   --  14.2  --   INR  --   --   --  1.1  --   HEPARINUNFRC  --  >1.10*  --  1.06*  --   CREATININE  --  1.03* 1.08* 1.01*  --     Estimated Creatinine Clearance: 56.1 mL/min (A) (by C-G formula based on SCr of 1.01 mg/dL (H)).   Medical History: Past Medical History:  Diagnosis Date   Anxiety    Arthritis    Attention deficit disorder (ADD)    Chronic hypoxic respiratory failure, on home oxygen  therapy (HCC) 01/28/2024   Chronic left-sided low back pain 01/18/2024   COPD (chronic obstructive pulmonary disease) (HCC)    Depression    Drug abuse (HCC)    Fibromyalgia    HFrEF (heart failure with reduced ejection fraction) (HCC)    History of substance use disorder 06/27/2017   Hyperlipidemia    Paroxysmal Atrial fibrillation    Paroxysmal ventricular tachycardia (HCC) 01/28/2024   S/P primary angioplasty with coronary stent 12/03/2022    Medications:  Scheduled:   acetaminophen   1,000 mg Oral Q6H   amiodarone   200 mg Oral Daily   aspirin  EC  81 mg Oral Daily   atorvastatin   80 mg Oral Daily   clopidogrel   75 mg Oral Daily   dapagliflozin  propanediol  10 mg Oral Daily   DULoxetine   60 mg Oral Daily   feeding  supplement  237 mL Oral BID BM   lidocaine   1 patch Transdermal Q24H   metoprolol  succinate  25 mg Oral Daily   Muscle Rub   Topical TID   nicotine   21 mg Transdermal Daily   pregabalin   75 mg Oral BID   sodium chloride  flush  3 mL Intravenous Q12H   spironolactone   25 mg Oral Daily   torsemide   40 mg Oral Daily    Assessment: 67 YOF w/ AF previously on Xarelto  (last dose 6/17 @ 1723) with new VT. Transition to IV heparin .   2/p LHC on 6/19, heparin   reumed 2h after radial band removed.   6/19 AM aPTT 129 on 1300 unitshr. >> Heparin  rate was decreased to 1200 units/hr.   Now aPTT = 133, supratherapeutic after heparin  rate was decreased this AM to 1200 units/hr.  No bleeding noted per RN.   aPTT lab appears to have been drawn from appropriate site opposite extremity of where heparin  infusing per my RN's report as no restrictions on using RUE for labs. Heparin  infusing at PIV in LUE.    Goal of  Therapy:  Heparin  level 0.3-0.7 units/ml aPTT 66-102 seconds Monitor platelets by anticoagulation protocol: Yes   Plan:  Hold  heparin  x 1 hour then restart heparin  infusion at reduced rate by 2-4 units/kg/hr to  950 units/hr.  Check PTT/HL in 6 hours.  Daily aPTT, HL and CBC   Thank you for allowing pharmacy to be part of this patients care team.  Alisa Irish, RPh Clinical Pharmacist Please check AMION for all [facility] numbers 01/28/2024

## 2024-01-28 NOTE — Plan of Care (Signed)
   Problem: Education: Goal: Knowledge of General Education information will improve Description Including pain rating scale, medication(s)/side effects and non-pharmacologic comfort measures Outcome: Progressing   Problem: Health Behavior/Discharge Planning: Goal: Ability to manage health-related needs will improve Outcome: Progressing

## 2024-01-29 ENCOUNTER — Other Ambulatory Visit (INDEPENDENT_AMBULATORY_CARE_PROVIDER_SITE_OTHER): Payer: Self-pay | Admitting: Student

## 2024-01-29 ENCOUNTER — Other Ambulatory Visit (HOSPITAL_COMMUNITY): Payer: Self-pay

## 2024-01-29 DIAGNOSIS — M4646 Discitis, unspecified, lumbar region: Secondary | ICD-10-CM

## 2024-01-29 LAB — BASIC METABOLIC PANEL WITH GFR
Anion gap: 9 (ref 5–15)
BUN: 33 mg/dL — ABNORMAL HIGH (ref 8–23)
CO2: 29 mmol/L (ref 22–32)
Calcium: 9.2 mg/dL (ref 8.9–10.3)
Chloride: 94 mmol/L — ABNORMAL LOW (ref 98–111)
Creatinine, Ser: 1.07 mg/dL — ABNORMAL HIGH (ref 0.44–1.00)
GFR, Estimated: 57 mL/min — ABNORMAL LOW (ref >60)
Glucose, Bld: 105 mg/dL — ABNORMAL HIGH (ref 70–99)
Potassium: 4 mmol/L (ref 3.5–5.1)
Sodium: 132 mmol/L — ABNORMAL LOW (ref 135–145)

## 2024-01-29 LAB — CBC
HCT: 37.3 % (ref 36.0–46.0)
Hemoglobin: 12.3 g/dL (ref 12.0–15.0)
MCH: 28.9 pg (ref 26.0–34.0)
MCHC: 33 g/dL (ref 30.0–36.0)
MCV: 87.6 fL (ref 80.0–100.0)
Platelets: 227 10*3/uL (ref 150–400)
RBC: 4.26 MIL/uL (ref 3.87–5.11)
RDW: 14.5 % (ref 11.5–15.5)
WBC: 9.9 10*3/uL (ref 4.0–10.5)
nRBC: 0 % (ref 0.0–0.2)

## 2024-01-29 MED ORDER — ACETAMINOPHEN 500 MG PO TABS
1000.0000 mg | ORAL_TABLET | Freq: Four times a day (QID) | ORAL | 0 refills | Status: AC
  Filled 2024-01-29: qty 30, 4d supply, fill #0

## 2024-01-29 MED ORDER — SPIRONOLACTONE 25 MG PO TABS
25.0000 mg | ORAL_TABLET | Freq: Every day | ORAL | 0 refills | Status: AC
  Filled 2024-01-29: qty 90, 90d supply, fill #0

## 2024-01-29 MED ORDER — MUSCLE RUB 10-15 % EX CREA
1.0000 | TOPICAL_CREAM | Freq: Three times a day (TID) | CUTANEOUS | 0 refills | Status: DC
  Filled 2024-01-29: qty 85, 29d supply, fill #0

## 2024-01-29 MED ORDER — CLOPIDOGREL BISULFATE 75 MG PO TABS
75.0000 mg | ORAL_TABLET | Freq: Every day | ORAL | 0 refills | Status: AC
Start: 2024-01-29 — End: ?
  Filled 2024-01-29: qty 60, 60d supply, fill #0

## 2024-01-29 MED ORDER — APIXABAN 5 MG PO TABS
5.0000 mg | ORAL_TABLET | Freq: Two times a day (BID) | ORAL | Status: DC
  Administered 2024-01-29: 5 mg via ORAL
  Filled 2024-01-29: qty 1

## 2024-01-29 MED ORDER — LIDOCAINE 5 % EX PTCH
1.0000 | MEDICATED_PATCH | CUTANEOUS | 0 refills | Status: DC
  Filled 2024-01-29: qty 30, 30d supply, fill #0

## 2024-01-29 MED ORDER — PREGABALIN 75 MG PO CAPS
75.0000 mg | ORAL_CAPSULE | Freq: Two times a day (BID) | ORAL | 0 refills | Status: AC
  Filled 2024-01-29: qty 60, 30d supply, fill #0

## 2024-01-29 MED ORDER — ASPIRIN 81 MG PO TBEC
81.0000 mg | DELAYED_RELEASE_TABLET | Freq: Every day | ORAL | 0 refills | Status: AC
  Filled 2024-01-29: qty 30, 30d supply, fill #0

## 2024-01-29 MED ORDER — DAPAGLIFLOZIN PROPANEDIOL 10 MG PO TABS
10.0000 mg | ORAL_TABLET | Freq: Every day | ORAL | 0 refills | Status: AC
Start: 2024-01-29 — End: ?
  Filled 2024-01-29: qty 60, 60d supply, fill #0

## 2024-01-29 MED ORDER — TORSEMIDE 20 MG PO TABS
40.0000 mg | ORAL_TABLET | Freq: Every day | ORAL | 0 refills | Status: AC
Start: 2024-01-29 — End: ?
  Filled 2024-01-29: qty 120, 60d supply, fill #0

## 2024-01-29 MED ORDER — APIXABAN 5 MG PO TABS
5.0000 mg | ORAL_TABLET | Freq: Two times a day (BID) | ORAL | 0 refills | Status: AC
  Filled 2024-01-29: qty 180, 90d supply, fill #0

## 2024-01-29 MED ORDER — METHOCARBAMOL 750 MG PO TABS
750.0000 mg | ORAL_TABLET | Freq: Four times a day (QID) | ORAL | 0 refills | Status: AC | PRN
Start: 2024-01-29 — End: ?
  Filled 2024-01-29: qty 60, 15d supply, fill #0

## 2024-01-29 MED ORDER — AMIODARONE HCL 200 MG PO TABS
200.0000 mg | ORAL_TABLET | Freq: Every day | ORAL | 0 refills | Status: AC
Start: 2024-01-29 — End: ?
  Filled 2024-01-29: qty 60, 60d supply, fill #0

## 2024-01-29 MED ORDER — METOPROLOL SUCCINATE ER 25 MG PO TB24
25.0000 mg | ORAL_TABLET | Freq: Every day | ORAL | 0 refills | Status: AC
  Filled 2024-01-29 – 2024-05-30 (×2): qty 90, 90d supply, fill #0

## 2024-01-29 MED ORDER — DULOXETINE HCL 60 MG PO CPEP
60.0000 mg | ORAL_CAPSULE | Freq: Every day | ORAL | 0 refills | Status: AC
  Filled 2024-01-29: qty 90, 90d supply, fill #0

## 2024-01-29 MED ORDER — ATORVASTATIN CALCIUM 80 MG PO TABS
80.0000 mg | ORAL_TABLET | Freq: Every day | ORAL | 0 refills | Status: AC
  Filled 2024-01-29: qty 90, 90d supply, fill #0

## 2024-01-29 NOTE — Progress Notes (Signed)
 CARDIAC REHAB PHASE I     Post stent education including site care, restrictions, risk factors, exercise guidelines, NTG use, antiplatelet therapy importance, heart healthy diet and CRP2 reviewed. All questions and concerns addressed. Will refer to [facility] for CRP2.    [id number] Vaughn Asberry Hacking, RN BSN 01/29/2024 10:36 AM

## 2024-01-29 NOTE — Progress Notes (Signed)
 MRI Lumbar ordered for patient. Scheduled 7/14 to evaluate possible diskitis/osteomyelitis in preparation for an appointment with Dr. Dennise Infectious Disease on 7/28.  Lonni Africa DO

## 2024-01-29 NOTE — Discharge Summary (Signed)
 Name: Autumn [name] MRN: [id number] DOB: [birth year] 68 y.o. PCP: Bobbette Coye LABOR, MD  Date of Admission: 01/17/2024  9:43 AM Date of Discharge:  01/29/24 Attending Physician: Dr. Trudy  DISCHARGE DIAGNOSIS:  Primary Problem: Acute on chronic systolic CHF (congestive heart failure) [facility])   Hospital Problems: Principal Problem:   Acute on chronic systolic CHF (congestive heart failure) (HCC) Active Problems:   History of substance use disorder   COPD (chronic obstructive pulmonary disease) (HCC)   Persistent atrial fibrillation (HCC)   S/P primary angioplasty with coronary stent; mid LAD and LCX   QT prolongation   Chronic left-sided low back pain   Anxiety state   AKI (acute kidney injury) (HCC)   Paroxysmal ventricular tachycardia (HCC)   Chronic hypoxic respiratory failure, on home oxygen  therapy (HCC)   Chronic low blood pressure   DISCHARGE MEDICATIONS:   Allergies as of 01/29/2024       Reactions   Pamelor [nortriptyline Hcl] Anaphylaxis   Vibra-tab [doxycycline] Other (See Comments)   Unknown reaction        Medication List     STOP taking these medications    gabapentin  300 MG capsule Commonly known as: NEURONTIN    rivaroxaban  20 MG Tabs tablet Commonly known as: XARELTO    sacubitril -valsartan  24-26 MG Commonly known as: ENTRESTO        TAKE these medications    Acetaminophen  Extra Strength 500 MG Tabs Take 2 tablets (1,000 mg total) by mouth every 6 (six) hours.   albuterol  108 (90 Base) MCG/ACT inhaler Commonly known as: VENTOLIN  HFA Inhale 2 puffs into the lungs every 4 (four) hours as needed for wheezing.   amiodarone  200 MG tablet Commonly known as: PACERONE  Take 1 tablet (200 mg total) by mouth daily. What changed: See the new instructions.   Analgesic Balm 10-15 % Crea Apply 1 Application topically 3 (three) times daily.   aspirin  EC 81 MG tablet Take 1 tablet (81 mg total) by mouth daily. Swallow whole. Start taking on:  January 30, 2024   atorvastatin  80 MG tablet Commonly known as: LIPITOR  Take 1 tablet (80 mg total) by mouth daily.   Breztri  Aerosphere 160-9-4.8 MCG/ACT Aero inhaler Generic drug: budesonide -glycopyrrolate -formoterol  Inhale 2 puffs into the lungs in the morning and at bedtime.   clopidogrel  75 MG tablet Commonly known as: PLAVIX  Take 1 tablet (75 mg total) by mouth daily.   DULoxetine  60 MG capsule Commonly known as: CYMBALTA  Take 1 capsule (60 mg total) by mouth daily.   Eliquis  5 MG Tabs tablet Generic drug: apixaban  Take 1 tablet (5 mg total) by mouth 2 (two) times daily.   Farxiga  10 MG Tabs tablet Generic drug: dapagliflozin  propanediol Take 1 tablet (10 mg total) by mouth daily.   lidocaine  5 % Commonly known as: LIDODERM  Place 1 patch onto the skin daily. Remove & Discard patch within 12 hours or as directed by MD   methocarbamol  750 MG tablet Commonly known as: ROBAXIN  Take 1 tablet (750 mg total) by mouth every 6 (six) hours as needed for muscle spasms. What changed:  medication strength how much to take when to take this   metoprolol  succinate 25 MG 24 hr tablet Commonly known as: TOPROL -XL Take 1 tablet (25 mg total) by mouth daily. Start taking on: January 30, 2024 What changed: additional instructions   naloxone  4 MG/0.1ML Liqd nasal spray kit Commonly known as: NARCAN  Place 4 mg into the nose as needed (overdose, as needed).   nicotine  14 mg/24hr  patch Commonly known as: NICODERM CQ  - dosed in mg/24 hours Place 1 patch (14 mg total) onto the skin daily.   pregabalin  75 MG capsule Commonly known as: LYRICA  Take 1 capsule (75 mg total) by mouth 2 (two) times daily.   spironolactone  25 MG tablet Commonly known as: ALDACTONE  Take 1 tablet (25 mg total) by mouth daily.   Stool Softener/Laxative 50-8.6 MG tablet Generic drug: senna-docusate Take 1 tablet by mouth 2 (two) times daily.   torsemide  20 MG tablet Commonly known as: DEMADEX  Take 2  tablets (40 mg total) by mouth daily. What changed:  how much to take when to take this               Durable Medical Equipment  (From admission, onward)           Start     Ordered   01/28/24 1154  For home use only DME Walker rolling  Once       Question Answer Comment  Walker: With 5 Inch Wheels   Patient needs a walker to treat with the following condition Physical deconditioning   Patient needs a walker to treat with the following condition Heart failure (HCC)      01/28/24 1154   01/27/24 0852  For home use only DME Vest life vest  Once       Comments: ZOLL Life Vest   Identify indication. Type yes or no  Cardiac Arrest due to VF or sustained VT: yes Familial or inherited condition with SCA risk: no MI with an EF < 35% or DCM with an EF < 35%: yes ICD Explanation:  Other condition with high risk of VT/VF: CAD  Life Vest Setting  VT 150 bpm VF 200 BPM 150Jx5 Length of need: 3 months  Start Date: 01/27/24   01/27/24 9147           DISPOSITION AND FOLLOW-UP:  [name] was discharged from [facility] in stable condition. At the hospital follow up visit please address:  Follow-up Recommendations: Consults: Needs to see cardiology, electrophysiology, ID (appointments are made) Dr Morene Brownie Cardiology - 02/07/24 Mr Ozell Passey Electrophysiology - 02/24/24 Dr. Loney Stank ID - 03/06/24 Labs: BMP Medications:  See above Aspirin , Plavix , Eliquis  Amiodarone  Spironolactone , Metoprolol , Farxiga  Torsemide  Lyrica , Cymbalta , Robaxin , topical anesthetics Stopping gabapentin  and Entresto   This patient was hospitalized for Afib RVR with subsequent polymorphic VT, acute HFrEF, and severe back pain. She required PCI and is discharged with a LifeVest per arrhythmias. She has cardiology/electrophysiology follow-up on file. There is some concern of diskitis on her lumbar back and she will see infectious disease next month.  Instructions were provided to facilitate a new PCP for the patient. She is discharged with all relevant medicines in hand. She will receive home health physical therapy. This plan was also shared with her granddaughter by phone. See hospital course for specific recommendations.  Follow-up Appointments:  Follow-up Information     [facility] Follow up on 02/07/2024.   Specialty: Cardiology Why: at 1130am [facility], Entrance C Free Valet Parking Contact information: [address] [place]  [address] [phone]        Bobbette Coye LABOR, MD. Go on 02/09/2024.   Specialty: Family Medicine Why: @2 :30pm Contact information: [address] [place] [address] [phone]         [facility] Follow up.   Why: Home Health  RN and Physical Therapy-agency will call to arrange Home Health Contact information: [phone]               HOSPITAL COURSE:  Patient Summary: FAUNA NEUNER is a 68 y.o. woman with a pertinent PMH of HFrEF, A-fib on Xarelto , attention deficit disorder, COPD, chronic hypoxic respiratory failure on 2 L [place], anxiety and depression, fibromyalgia, HLD, and CAD.    She presented with acute on chronic left hip pain and admitted for A-fib with RVR and acute HFrEF with hospital course complicated by cardiac rhythm instability (self-terminating VT) requiring two nights ICU care and is S/P PCI to Lcx and LAD.   Arrhythmias Atrial Fibrillation with RVR s/p Cardioversion 6/11 now NSR Polymorphic VT requiring ICU care now resolved Long Qt Admission rates as high as the 130s but hemodynamically stable.  Refractory to diltaizem and amiodarone  infusions and so required DCCV with prior TEE without LA thrombus (was not taking her prescribed anticoagulaiton PTH). Electrical cardioversion successful on 6/11. Briefly degraded to Afib again but chemical cardioversion via amiodarone   infusion was achieved.  - Discharge in NSR with recommendation of Amiodarone  200mg  daily and anticoagulation of ASA/Plavix /Eliquis  x 1 month, then Plavix /Eliquis  x 1 year (influence from CAD/stending per below). She is also on Toprol  25 daily.  Approx 1 week into hospitalization, she demonstrated arrhythmia of a polymorphic VT/ Torsades several seconds long but self terminating. This happened twice and she had diminished consciousness, but returned to normal after arrhythmia subsided. She was observed for two nights in the cardiac ICU due to this. Amiodarone  dose was reduced given a long Qtc that may promote such arrhtyhmias. - Recommend caution with medicines that prolong Qtc - She is discharged with a life vest and electrophysiology follow up. Comorbid conditions prevent candidacy for implanted devices.   CAD S/P PCI stenting of LCX and LAD HFrEF with EF 30-35% Low blood pressure limiting GDMT Mitral Regurgitation Hx COPD, Chronic hypoxic respiratory failure on 2L O2 Dyspnea and volume overload on admission but no hypoxia. Treated with IV diuresis at one point requiring a lasix  drip. It is likely that her uncontrolled Afib/tachyarrhythmia will continue to contribute to her cardiomyopathy. Catheterization after arrhythmias demonstrated high grade disease in the LCX and LAD with PCI performed. Discharge GDMT somewhat limited due to blood pressure. - GDMT is Toprol  25mg  daily, Spiro 25 daily, Farxiga  10 every day. Entresto  is halted.  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 States her pain contributes to ambulatory dysfunction. Consistent with radiculopathy stemming from a fall 2 months ago with progression. No recent trauma or neurologic dysfunction. MRI lumbar spine with some evidence of Diskitis, but clinically does not have signs of this. Afebrile, no leukocytosis, clean blood cultures, normal  inflammatory markers, not requiring Abx. Discussed this finding with ID on call. Dr. Dennise offered to see Ms Clasby in her office in late July. I have ordered an MRI to be done a [facility] hospital in mid July in preparation for that appointment. - Tx tylenol , lyrica , Cymbalta , robaxin , lidocaine  patch, muscle rub cream and discharged with supplies of all these. Gabapentin  stopped as it caused tremors. - Consider injection therapy after diskitis workup - She will receive HHPT at discharge  Acute encephalopathy-hypersomnolence Concern for sedative overdose Substance use disorder MDD/GAD Presented with hypersomnolence - awakable to sternal rub and oriented appropriately but quickly fell\ asleep. She admits to obtaining  Xanax  from street to self treat mood disorders. Somnolence resolved with time and supportive care. - May benefit from further psychiatry and substance abuse resources out of hospital - Started cymbalta  and per above   DISCHARGE INSTRUCTIONS:   Discharge Instructions     Amb Referral to Cardiac Rehabilitation   Complete by: As directed    Diagnosis: Coronary Stents   After initial evaluation and assessments completed: Virtual Based Care may be provided alone or in conjunction with Phase 2 Cardiac Rehab based on patient barriers.: Yes   Intensive Cardiac Rehabilitation (ICR) [facility] location only OR Traditional Cardiac Rehabilitation (TCR) *If criteria for ICR are not met will enroll in TCR [facility] only): Yes   Call MD for:  difficulty breathing, headache or visual disturbances   Complete by: As directed    Call MD for:  extreme fatigue   Complete by: As directed    Call MD for:  persistant dizziness or light-headedness   Complete by: As directed    Diet - low sodium heart healthy   Complete by: As directed    Discharge instructions   Complete by: As directed    You were hospitalized for back pain, heart failure, and an abnormal heart rhythm called A-fib.  For your back pain, you  appear to have severe arthritis.  It is also possible that you have a condition called discitis that is contributing to your pain.  In order to address this, you need to see Dr. Dennise with infectious disease. She intends to see you on July 28th. Expect a call from her office. If you do not hear from her by Wednesday, call the office at [phone]. You need to get an MRI before your office appointment. I have ordered an MRI to be completed on 7/14 at [facility]. They should call you to set this up, but if you do not hear by Wednesday, call [phone] to make the appointment.  I will send you with Tylenol , muscle cream, duloxetine , robaxin , lidocaine  patches, and pregabalin  to help control the pain.  Use these according to the written instructions.  Your heart, you have some new medicines and some medicine changes.  These medicines are extremely important in order to prevent the stent you received from becoming blocked in order to keep your heart in a good rhythm.  The new medicines are aspirin  81 mg daily, clopidogrel  75 mg daily, Eliquis  5 mg twice daily, amiodarone  200 mg daily, metoprolol  25 mg daily, torsemide  40 mg daily.  You can stop the following medicines: Gabapentin , rivaroxaban , Entresto .  Your discharge paperwork includes a QR code that you can use to establish care with a primary doctor in [place].  You can make your own appointment by calling [facility] or visiting their website.  Once you see a primary doctor, ask about a referral to a pain doctor for your back pain. Some people find that injections are helpful.  For your heart, you have an appointment with Dr. Odis Brownie on 02/07/2024 at 11:40 AM.  It is extremely important that you take your prescribed medicines. We wish you the best.   Increase activity slowly   Complete by: As directed       SUBJECTIVE:  She is restful this morning. Denies CP, dyspnea, palpitations. Feels  prepared to go home. Agreeable to her upcoming appointments with cardiology, ID, plans to find a new PCP. Discharge Vitals:   BP 116/65 (BP Location: Left Arm)   Pulse ROLLEN)  57   Temp 97.7 F (36.5 C) (Oral)   Resp 19   Ht 5' 4 (1.626 m)   Wt 85.2 kg   SpO2 94%   BMI 32.24 kg/m   OBJECTIVE:  Physical Exam Constitutional:      General: She is not in acute distress.    Appearance: She is not ill-appearing.  Cardiovascular:     Rate and Rhythm: Normal rate and regular rhythm.     Pulses: Normal pulses.  Pulmonary:     Effort: Pulmonary effort is normal.     Breath sounds: Normal breath sounds. No wheezing or rales.  Musculoskeletal:     Right lower leg: No edema.     Left lower leg: No edema.     Comments: Some bruising R arm, improving Skin:    General: Skin is warm and dry.  Neurological:     General: No focal deficit present.     Mental Status: She is alert. Mental status is at baseline.  Psychiatric:        Mood and Affect: Mood normal.        Behavior: Behavior normal.   Pertinent Labs, Studies, and Procedures:     Latest Ref Rng & Units 01/29/2024   11:43 AM 01/28/2024    3:31 AM 01/27/2024    5:57 AM  CBC  WBC 4.0 - 10.5 K/uL 9.9  8.4  7.2   Hemoglobin 12.0 - 15.0 g/dL 87.6  85.8  85.5   Hematocrit 36.0 - 46.0 % 37.3  42.2  42.8   Platelets 150 - 400 K/uL 227  256  242       Latest Ref Rng & Units 01/29/2024   11:43 AM 01/28/2024    3:31 AM 01/27/2024    3:28 PM  CMP  Glucose 70 - 99 mg/dL 894  91  99   BUN 8 - 23 mg/dL 33  27  28   Creatinine 0.44 - 1.00 mg/dL 8.92  8.98  8.91   Sodium 135 - 145 mmol/L 132  134  131   Potassium 3.5 - 5.1 mmol/L 4.0  3.6  3.8   Chloride 98 - 111 mmol/L 94  94  88   CO2 22 - 32 mmol/L 29  29  30    Calcium  8.9 - 10.3 mg/dL 9.2  9.2  9.6    DG Chest 1 View Result Date: 01/17/2024 CLINICAL DATA:  Fall. EXAM: CHEST  1 VIEW COMPARISON:  08/09/2023. FINDINGS: Cardiomegaly. Aortic atherosclerosis. No focal consolidation, sizeable  pleural effusion, or pneumothorax. No acute osseous abnormality identified. IMPRESSION: 1. No acute findings in the chest. 2. Cardiomegaly. Electronically Signed   By: Harrietta Sherry M.D.   On: 01/17/2024 12:49   DG HIP UNILAT W OR W/O PELVIS 2-3 VIEWS LEFT Result Date: 01/17/2024 CLINICAL DATA:  Pain after fall. EXAM: DG HIP (WITH OR WITHOUT PELVIS) 2-3V LEFT COMPARISON:  08/06/2023. FINDINGS: There is no evidence of acute fracture or dislocation. Femoral heads are seated within the acetabulum. Mild degenerative changes of the bilateral hips. Sacroiliac joints and pubic symphysis appear anatomically aligned. Degenerative changes of the visualized lower lumbar spine. IMPRESSION: No acute osseous abnormality. Electronically Signed   By: Harrietta Sherry M.D.   On: 01/17/2024 12:47    Signed: Lonni Africa, DO Internal Medicine Resident, PGY-1 [facility]  3:08 PM, 01/29/2024

## 2024-01-29 NOTE — Plan of Care (Signed)
  Problem: Education: Goal: Knowledge of General Education information will improve Description: Including pain rating scale, medication(s)/side effects and non-pharmacologic comfort measures Outcome: Adequate for Discharge   Problem: Health Behavior/Discharge Planning: Goal: Ability to manage health-related needs will improve Outcome: Adequate for Discharge   Problem: Clinical Measurements: Goal: Ability to maintain clinical measurements within normal limits will improve Outcome: Adequate for Discharge Goal: Will remain free from infection Outcome: Adequate for Discharge Goal: Diagnostic test results will improve Outcome: Adequate for Discharge Goal: Respiratory complications will improve Outcome: Adequate for Discharge Goal: Cardiovascular complication will be avoided Outcome: Adequate for Discharge   Problem: Activity: Goal: Risk for activity intolerance will decrease Outcome: Adequate for Discharge   Problem: Nutrition: Goal: Adequate nutrition will be maintained Outcome: Adequate for Discharge   Problem: Coping: Goal: Level of anxiety will decrease Outcome: Adequate for Discharge   Problem: Elimination: Goal: Will not experience complications related to bowel motility Outcome: Adequate for Discharge Goal: Will not experience complications related to urinary retention Outcome: Adequate for Discharge   Problem: Pain Managment: Goal: General experience of comfort will improve and/or be controlled Outcome: Adequate for Discharge   Problem: Safety: Goal: Ability to remain free from injury will improve Outcome: Adequate for Discharge   Problem: Skin Integrity: Goal: Risk for impaired skin integrity will decrease Outcome: Adequate for Discharge   Problem: Education: Goal: Knowledge of disease or condition will improve Outcome: Adequate for Discharge Goal: Understanding of medication regimen will improve Outcome: Adequate for Discharge Goal: Individualized  Educational Video(s) Outcome: Adequate for Discharge   Problem: Activity: Goal: Ability to tolerate increased activity will improve Outcome: Adequate for Discharge   Problem: Cardiac: Goal: Ability to achieve and maintain adequate cardiopulmonary perfusion will improve Outcome: Adequate for Discharge   Problem: Health Behavior/Discharge Planning: Goal: Ability to safely manage health-related needs after discharge will improve Outcome: Adequate for Discharge   Problem: Acute Rehab PT Goals(only PT should resolve) Goal: Pt Will Go Supine/Side To Sit Outcome: Adequate for Discharge Goal: Pt Will Go Sit To Supine/Side Outcome: Adequate for Discharge Goal: Patient Will Transfer Sit To/From Stand Outcome: Adequate for Discharge Goal: Pt Will Ambulate Outcome: Adequate for Discharge Goal: Pt Will Go Up/Down Stairs Outcome: Adequate for Discharge   Problem: Acute Rehab OT Goals (only OT should resolve) Goal: Pt. Will Perform Grooming Outcome: Adequate for Discharge Goal: Pt. Will Perform Lower Body Dressing Outcome: Adequate for Discharge Goal: Pt. Will Transfer To Toilet Outcome: Adequate for Discharge Goal: Pt. Will Perform Toileting-Clothing Manipulation Outcome: Adequate for Discharge Goal: OT Additional ADL Goal #1 Outcome: Adequate for Discharge   Problem: Education: Goal: Understanding of CV disease, CV risk reduction, and recovery process will improve Outcome: Adequate for Discharge Goal: Individualized Educational Video(s) Outcome: Adequate for Discharge   Problem: Activity: Goal: Ability to return to baseline activity level will improve Outcome: Adequate for Discharge   Problem: Health Behavior/Discharge Planning: Goal: Ability to safely manage health-related needs after discharge will improve Outcome: Adequate for Discharge

## 2024-01-29 NOTE — Progress Notes (Signed)
 Patient discharged home, discharge instructions, RX's and follow up appts explained and provided to patient and c/g verbalized understanding. Life Vest placed on patient before d/c. Patient left floor via wheelchair accompanied by staff. No c/o pain or shortness of breath at d/c.  Kennya Schwenn, Cena Helling, RN

## 2024-01-29 NOTE — TOC Transition Note (Signed)
 Transition of Care [name]) - Discharge Note   Patient Details  Name: Autumn [name] MRN: [id number] Date of Birth: 1956-01-27  Transition of Care [facility]) CM/SW Contact:  Marval Gell, RN Phone Number: 01/29/2024, 9:17 AM   Clinical Narrative:     Spoke w patient and she states she has her RW for home and her Life Vest. I have notified [facility] of DC for today.  Patient states she has a ride home  No other TOC needs identified at this time.   Final next level of care: Home w Home Health Services Barriers to Discharge: No Barriers Identified   Patient Goals and CMS Choice Patient states their goals for this hospitalization and ongoing recovery are:: wants to get better CMS Medicare.gov Compare Post Acute Care list provided to:: Patient Choice offered to / list presented to : Patient      Discharge Placement                       Discharge Plan and Services Additional resources added to the After Visit Summary for     Discharge Planning Services: CM Consult Post Acute Care Choice: Home Health          DME Arranged: Walker rolling DME Agency: [employer] Date DME Agency Contacted: 01/28/24 Time DME Agency Contacted: 1201 Representative spoke with at DME Agency: [facility] Arranged: PT, RN HH Agency: [facility] Date HH Agency Contacted: 01/28/24 Time HH Agency Contacted: 1201 Representative spoke with at [facility] Agency: Donette  Social Drivers of Health (SDOH) Interventions SDOH Screenings   Food Insecurity: No Food Insecurity (01/18/2024)  Housing: Low Risk  (01/18/2024)  Transportation Needs: No Transportation Needs (01/18/2024)  Utilities: Not At Risk (01/18/2024)  Alcohol Screen: Low Risk  (06/02/2023)  Financial Resource Strain: Low Risk  (08/19/2023)   Received from [facility]  Physical Activity: Unknown (08/19/2023)   Received from [facility]  Social Connections: Unknown (01/18/2024)  Stress: No Stress Concern Present (08/19/2023)   Received  from [facility]  Tobacco Use: High Risk (01/17/2024)     Readmission Risk Interventions    01/21/2023    1:29 PM 12/07/2022    1:27 PM  Readmission Risk Prevention Plan  Transportation Screening Complete Complete  PCP or Specialist Appt within 3-5 Days  Complete  HRI or Home Care Consult  Complete  Social Work Consult for Recovery Care Planning/Counseling  Complete  Palliative Care Screening  Not Applicable  Medication Review Oceanographer) Complete Complete  PCP or Specialist appointment within 3-5 days of discharge Complete   HRI or Home Care Consult Complete   SW Recovery Care/Counseling Consult Complete   Palliative Care Screening Not Applicable   Skilled Nursing Facility Not Applicable

## 2024-01-29 NOTE — Plan of Care (Signed)
 Pt not expressing symptoms of pain

## 2024-01-30 LAB — CULTURE, BLOOD (ROUTINE X 2)
Culture: NO GROWTH
Culture: NO GROWTH

## 2024-02-01 ENCOUNTER — Encounter (HOSPITAL_COMMUNITY)

## 2024-02-02 ENCOUNTER — Encounter (HOSPITAL_COMMUNITY): Payer: Self-pay

## 2024-02-04 ENCOUNTER — Telehealth (HOSPITAL_COMMUNITY): Payer: Self-pay | Admitting: Cardiology

## 2024-02-04 NOTE — Telephone Encounter (Signed)
 Called to confirm/remind patient of their appointment at the [facility] on 02/04/2024.   Appointment:   [] Confirmed  [] Left mess   [x] No answer/No voice mail  [] VM Full/unable to leave message  [] Phone not in service  Patient reminded to bring all medications and/or complete list.  Confirmed patient has transportation. Gave directions, instructed to utilize valet parking.

## 2024-02-05 ENCOUNTER — Other Ambulatory Visit: Payer: Self-pay

## 2024-02-05 ENCOUNTER — Emergency Department (HOSPITAL_COMMUNITY)

## 2024-02-05 ENCOUNTER — Encounter (HOSPITAL_COMMUNITY): Payer: Self-pay | Admitting: Emergency Medicine

## 2024-02-05 ENCOUNTER — Emergency Department (HOSPITAL_COMMUNITY)
Admission: EM | Admit: 2024-02-05 | Discharge: 2024-02-05 | Disposition: A | Discharge status: Home / Self Care | Attending: Emergency Medicine | Admitting: Emergency Medicine

## 2024-02-05 DIAGNOSIS — Z7982 Long term (current) use of aspirin: Secondary | ICD-10-CM | POA: Diagnosis not present

## 2024-02-05 DIAGNOSIS — J449 Chronic obstructive pulmonary disease, unspecified: Secondary | ICD-10-CM | POA: Diagnosis not present

## 2024-02-05 DIAGNOSIS — I5021 Acute systolic (congestive) heart failure: Secondary | ICD-10-CM | POA: Insufficient documentation

## 2024-02-05 DIAGNOSIS — Z7901 Long term (current) use of anticoagulants: Secondary | ICD-10-CM | POA: Diagnosis not present

## 2024-02-05 DIAGNOSIS — M545 Low back pain, unspecified: Secondary | ICD-10-CM | POA: Insufficient documentation

## 2024-02-05 DIAGNOSIS — Z7902 Long term (current) use of antithrombotics/antiplatelets: Secondary | ICD-10-CM | POA: Insufficient documentation

## 2024-02-05 DIAGNOSIS — M5441 Lumbago with sciatica, right side: Secondary | ICD-10-CM

## 2024-02-05 DIAGNOSIS — M549 Dorsalgia, unspecified: Secondary | ICD-10-CM | POA: Diagnosis present

## 2024-02-05 LAB — CBC WITH DIFFERENTIAL/PLATELET
Abs Immature Granulocytes: 0.03 10*3/uL (ref 0.00–0.07)
Basophils Absolute: 0.1 10*3/uL (ref 0.0–0.1)
Basophils Relative: 1 %
Eosinophils Absolute: 0.3 10*3/uL (ref 0.0–0.5)
Eosinophils Relative: 3 %
HCT: 40.5 % (ref 36.0–46.0)
Hemoglobin: 12.7 g/dL (ref 12.0–15.0)
Immature Granulocytes: 0 %
Lymphocytes Relative: 21 %
Lymphs Abs: 2.1 10*3/uL (ref 0.7–4.0)
MCH: 29.4 pg (ref 26.0–34.0)
MCHC: 31.4 g/dL (ref 30.0–36.0)
MCV: 93.8 fL (ref 80.0–100.0)
Monocytes Absolute: 0.8 10*3/uL (ref 0.1–1.0)
Monocytes Relative: 8 %
Neutro Abs: 6.8 10*3/uL (ref 1.7–7.7)
Neutrophils Relative %: 67 %
Platelets: 276 10*3/uL (ref 150–400)
RBC: 4.32 MIL/uL (ref 3.87–5.11)
RDW: 15.1 % (ref 11.5–15.5)
WBC: 10.1 10*3/uL (ref 4.0–10.5)
nRBC: 0 % (ref 0.0–0.2)

## 2024-02-05 LAB — BASIC METABOLIC PANEL WITH GFR
Anion gap: 10 (ref 5–15)
BUN: 13 mg/dL (ref 8–23)
CO2: 35 mmol/L — ABNORMAL HIGH (ref 22–32)
Calcium: 9.6 mg/dL (ref 8.9–10.3)
Chloride: 94 mmol/L — ABNORMAL LOW (ref 98–111)
Creatinine, Ser: 0.83 mg/dL (ref 0.44–1.00)
GFR, Estimated: >60 mL/min (ref >60)
Glucose, Bld: 78 mg/dL (ref 70–99)
Potassium: 3.4 mmol/L — ABNORMAL LOW (ref 3.5–5.1)
Sodium: 139 mmol/L (ref 135–145)

## 2024-02-05 MED ORDER — MORPHINE SULFATE (PF) 4 MG/ML IV SOLN
4.0000 mg | Freq: Once | INTRAVENOUS | Status: DC
  Filled 2024-02-05: qty 1

## 2024-02-05 MED ORDER — LIDOCAINE 5 % EX PTCH: 1.0000 | MEDICATED_PATCH | CUTANEOUS | 0 refills | Status: AC

## 2024-02-05 MED ORDER — GADOBUTROL 1 MMOL/ML IV SOLN
6.0000 mL | Freq: Once | INTRAVENOUS | Status: AC | PRN
  Administered 2024-02-05: 6 mL via INTRAVENOUS

## 2024-02-05 NOTE — Discharge Instructions (Addendum)
 Last week at your hospital discharge, you were given prescriptions for medications including Lyrica , Lidoderm  patches, Robaxin  muscle relaxer to help control your pain.  You have an appointment on July 2 with a primary care doctor.  Return to the emergency room if you have any worsening symptoms.

## 2024-02-05 NOTE — ED Provider Notes (Signed)
 [place] EMERGENCY DEPARTMENT AT [facility] Provider Note   CSN: [id number] Arrival date & time: 02/05/24  9557     Patient presents with: Back Pain   Autumn [name] is a 68 y.o. female.   HPI     This is a 68 year old female who presents with reported back pain.  Patient was recently admitted and worked up for back pain to include MRI.  There was some concern for discitis or early osteomyelitis; however, she did not have any supportive clinical evidence of this.  She was to follow-up with infectious disease and have a repeat MRI as an outpatient.  Patient reports ongoing progressive abdominal pain that radiates down the left leg.  She states that it is worse than it was.  She states that is making it hard to walk.  Denies bowel or bladder difficulty.  Has not had fevers.  States she is not taking anything but Tylenol  for pain.  Prior to Admission medications   Medication Sig Start Date End Date Taking? Authorizing Provider  acetaminophen  (TYLENOL ) 500 MG tablet Take 2 tablets (1,000 mg total) by mouth every 6 (six) hours. 01/29/24   Harrie Bruckner, DO  albuterol  (VENTOLIN  HFA) 108 (90 Base) MCG/ACT inhaler Inhale 2 puffs into the lungs every 4 (four) hours as needed for wheezing. 05/20/23   [provider]  amiodarone  (PACERONE ) 200 MG tablet Take 1 tablet (200 mg total) by mouth daily. 01/29/24   Harrie Bruckner, DO  apixaban  (ELIQUIS ) 5 MG TABS tablet Take 1 tablet (5 mg total) by mouth 2 (two) times daily. 01/29/24   Harrie Bruckner, DO  aspirin  EC 81 MG tablet Take 1 tablet (81 mg total) by mouth daily. Swallow whole. 01/30/24 02/29/24  Harrie Bruckner, DO  atorvastatin  (LIPITOR ) 80 MG tablet Take 1 tablet (80 mg total) by mouth daily. 01/29/24   Harrie Bruckner, DO  Budeson-Glycopyrrol-Formoterol  (BREZTRI  AEROSPHERE) 160-9-4.8 MCG/ACT AERO Inhale 2 puffs into the lungs in the morning and at bedtime. 08/23/23   Zenaida Morene PARAS, MD   clopidogrel  (PLAVIX ) 75 MG tablet Take 1 tablet (75 mg total) by mouth daily. 01/29/24   Harrie Bruckner, DO  dapagliflozin  propanediol (FARXIGA ) 10 MG TABS tablet Take 1 tablet (10 mg total) by mouth daily. 01/29/24   Harrie Bruckner, DO  DULoxetine  (CYMBALTA ) 60 MG capsule Take 1 capsule (60 mg total) by mouth daily. 01/29/24   Harrie Bruckner, DO  lidocaine  (LIDODERM ) 5 % Place 1 patch onto the skin daily. Remove & Discard patch within 12 hours or as directed by MD 01/29/24   Harrie Bruckner, DO  Menthol -Methyl Salicylate  (MUSCLE RUB) 10-15 % CREA Apply 1 Application topically 3 (three) times daily. 01/29/24   Harrie Bruckner, DO  methocarbamol  (ROBAXIN ) 750 MG tablet Take 1 tablet (750 mg total) by mouth every 6 (six) hours as needed for muscle spasms. 01/29/24   Harrie Bruckner, DO  metoprolol  succinate (TOPROL -XL) 25 MG 24 hr tablet Take 1 tablet (25 mg total) by mouth daily. 01/30/24   Harrie Bruckner, DO  naloxone  (NARCAN ) nasal spray 4 mg/0.1 mL Place 4 mg into the nose as needed (overdose, as needed). 10/06/19   [provider]  nicotine  (NICODERM CQ  - DOSED IN MG/24 HOURS) 14 mg/24hr patch Place 1 patch (14 mg total) onto the skin daily. Patient not taking: Reported on 01/18/2024 06/10/23   Gabino Boga, MD  pregabalin  (LYRICA ) 75 MG capsule Take 1 capsule (75 mg total) by mouth 2 (two) times daily. 01/29/24  Harrie Bruckner, DO  senna-docusate (SENOKOT-S) 8.6-50 MG tablet Take 1 tablet by mouth 2 (two) times daily. 08/12/23   Fairy Frames, MD  spironolactone  (ALDACTONE ) 25 MG tablet Take 1 tablet (25 mg total) by mouth daily. 01/29/24   Harrie Bruckner, DO  torsemide  (DEMADEX ) 20 MG tablet Take 2 tablets (40 mg total) by mouth daily. 01/29/24   Harrie Bruckner, DO    Allergies: Pamelor [nortriptyline hcl] and Vibra-tab [doxycycline]    Review of Systems  Constitutional:  Negative for fever.  Respiratory:  Negative for shortness of breath.    Cardiovascular:  Negative for chest pain.  Gastrointestinal:  Negative for abdominal pain.  Genitourinary:  Negative for difficulty urinating.  Musculoskeletal:  Positive for back pain.  Neurological:  Negative for weakness and numbness.  All other systems reviewed and are negative.   Updated Vital Signs BP 130/76   Pulse 78   Temp 98.4 F (36.9 C)   Resp 18   Ht 1.626 m (5' 4)   Wt 85 kg   SpO2 97%   BMI 32.17 kg/m   Physical Exam Vitals and nursing note reviewed.  Constitutional:      Appearance: She is well-developed. She is obese. She is not ill-appearing.  HENT:     Head: Normocephalic and atraumatic.   Eyes:     Pupils: Pupils are equal, round, and reactive to light.    Cardiovascular:     Rate and Rhythm: Normal rate and regular rhythm.     Heart sounds: Normal heart sounds.  Pulmonary:     Effort: Pulmonary effort is normal. No respiratory distress.     Breath sounds: No wheezing.  Abdominal:     General: Bowel sounds are normal.     Palpations: Abdomen is soft.   Musculoskeletal:     Cervical back: Neck supple.     Comments: Lower lumbar tenderness to palpation, positive left straight leg raise   Skin:    General: Skin is warm and dry.   Neurological:     Mental Status: She is alert and oriented to person, place, and time.     Comments: 4+ out of 5 strength with hip flexion bilaterally, plantar and dorsiflexion bilaterally, symmetric patellar reflexes bilaterally  Psychiatric:        Mood and Affect: Mood normal.     (all labs ordered are listed, but only abnormal results are displayed) Labs Reviewed  CBC WITH DIFFERENTIAL/PLATELET  BASIC METABOLIC PANEL WITH GFR    EKG: None  Radiology: No results found.   Procedures   Medications Ordered in the ED  morphine  (PF) 4 MG/ML injection 4 mg (has no administration in time range)                                    Medical Decision Making Amount and/or Complexity of Data  Reviewed Labs: ordered. Radiology: ordered.  Risk Prescription drug management.   This patient presents to the ED for concern of back pain, this involves an extensive number of treatment options, and is a complaint that carries with it a high risk of complications and morbidity.  I considered the following differential and admission for this acute, potentially life threatening condition.  The differential diagnosis includes osteomyelitis, discitis, sciatica  MDM:    This is a 68 year old female who presents with ongoing worsening back pain.  Questionable osteomyelitis/discitis although did not clinically present that during her last  hospitalization and was due to follow-up with infectious disease. Cauda equina.  She is afebrile and vital signs are reassuring.  Patient was given pain and nausea medication.  Will recheck labs and go ahead and repeat MRI to see if there is progression.  (Labs, imaging, consults)  Labs: I Ordered, and personally interpreted labs.  The pertinent results include: CBC, BMP  Imaging Studies ordered: I ordered imaging studies including MRI. I independently visualized and interpreted imaging. I agree with the radiologist interpretation  Additional history obtained from chart review.  External records from outside source obtained and reviewed including discharge summary  Cardiac Monitoring: The patient was maintained on a cardiac monitor.  If on the cardiac monitor, I personally viewed and interpreted the cardiac monitored which showed an underlying rhythm of: Sinus  Reevaluation: After the interventions noted above, I reevaluated the patient and found that they have :stayed the same  Social Determinants of Health:  lives independently  Disposition: Pending  Co morbidities that complicate the patient evaluation  Past Medical History:  Diagnosis Date   Anxiety    Arthritis    Attention deficit disorder (ADD)    Chronic hypoxic respiratory failure, on  home oxygen  therapy (HCC) 01/28/2024   Chronic left-sided low back pain 01/18/2024   COPD (chronic obstructive pulmonary disease) (HCC)    Depression    Drug abuse (HCC)    Fibromyalgia    HFrEF (heart failure with reduced ejection fraction) (HCC)    History of substance use disorder 06/27/2017   Hyperlipidemia    Paroxysmal Atrial fibrillation    Paroxysmal ventricular tachycardia (HCC) 01/28/2024   S/P primary angioplasty with coronary stent 12/03/2022     Medicines Meds ordered this encounter  Medications   morphine  (PF) 4 MG/ML injection 4 mg    I have reviewed the patients home medicines and have made adjustments as needed  Problem List / ED Course: Problem List Items Addressed This Visit   None            Final diagnoses:  None    ED Discharge Orders     None          Bari Charmaine FALCON, MD 02/05/24 [phone]

## 2024-02-05 NOTE — ED Notes (Signed)
 Pt difficult stick, IV team consult placed, MD Horton notified.

## 2024-02-05 NOTE — ED Triage Notes (Signed)
 Patient BIB EMS from home c/o back pain.x 1 month. Patient report worsening back pain tonight.  Patient report unable do ADLS at home. Patient report taking PRN pain medicine without relief. Patient denies N/V. Patient denies Fever and chills.  BP 144/66 HR 80  RR 20 O2sat 99% on RA

## 2024-02-05 NOTE — ED Notes (Signed)
 Attempted to ambulate PT. After taking 3-4 step but stated that she was in a lot of pain and was unable to walk any longer.

## 2024-02-05 NOTE — ED Provider Notes (Addendum)
 Patient presents with worsening back pain.  Awaiting MRI results.  MRI shows no change from prior MRI which would favor chronic degenerative changes rather than acute infection.  Labs are nonconcerning.  On patient reevaluation, she is fairly sleepy.  She states she took a Xanax  just prior to arrival and thinks she is sleepy because of that.  Once she is less somnolent, we will ambulate her and see how she does.  She had morphine  ordered for her pain, but was not given that due to her somnolence.  11:53 patient was able to get out of bed and ambulate although she did not want a walk more than a few steps due to her pain.  She is moving both legs symmetrically without focal neuro deficits.  When I go back in the room, she is asleep but will wake up with verbal stimuli.  I did advise her that I am not can be able to give her stronger opioid pain medications given her somnolence.  She states she took a Xanax .  She is not prescribed this and says it was a family members Xanax .  On chart review, she was discharged last week.  At that time she was given prescriptions for Lyrica , Lidoderm  patches and Robaxin  to help control her pain and an appointment with a primary care doctor was made for her on July 2.  Advised her of this.  She was discharged home in good condition.  Encouraged to follow-up with the primary care doctor on July 2 and continue using the medications prescribed.  She became quite agitated after being told of her upcoming discharge and that we were not giving her different medicaitons.  I advised her I was not comfortable prescribing her opioid medications and that she had multiple other medications that she was prescribed last week to use for her pain.   Lenor Hollering, MD 02/05/24 1155    Lenor Hollering, MD 02/05/24 [id number]    Lenor Hollering, MD 02/05/24 1229

## 2024-02-05 NOTE — ED Notes (Signed)
 USIV team put IV in.  I went to pts room and could not wake her until I started shaking her at the shoulder.  Pt has an order for pain Rx but she is not alert enough at this time to give it. Also, pt going to MR

## 2024-02-06 ENCOUNTER — Telehealth (HOSPITAL_COMMUNITY): Payer: Self-pay | Admitting: Surgery

## 2024-02-06 NOTE — Telephone Encounter (Signed)
 I called to attempt to cancel patient's scheduled appt in the [facility] for the AM. There was no ability to leave a message.

## 2024-02-07 ENCOUNTER — Encounter (HOSPITAL_COMMUNITY): Admitting: Cardiology

## 2024-02-16 ENCOUNTER — Ambulatory Visit (HOSPITAL_COMMUNITY)

## 2024-02-16 ENCOUNTER — Telehealth (HOSPITAL_COMMUNITY): Payer: Self-pay

## 2024-02-16 NOTE — Telephone Encounter (Signed)
 Spoke to patient regarding cardiac rehab, patient states she is unable to attend at this time due to transportation issues. Informed patient if anything changes she can call us  to reopen her referral and we will begin processing it then.  Closing referral.

## 2024-02-17 ENCOUNTER — Ambulatory Visit: Payer: Self-pay | Admitting: Internal Medicine

## 2024-02-18 NOTE — Progress Notes (Signed)
 [facility] FOLLOW UP CLINIC NOTE  Referring Physician: Corrington, Kip A, MD  Primary Care: Corrington, Coye LABOR, MD Primary Cardiologist:  HPI: Autumn [name] is a 68 y.o. female PMH of h/o systolic heart failure, CAD s/p prior DES to LAD, atrial fibrillation, mitral regurgitation and COPD who presents for follow up of chronic systolic HF      Echo 6/22 EF 39-34%, RV normal.   Admitted 4/24 for acute CHF. Echo showed drop in EF down to 40-45%, diffuse HK, RV normal, calcified/degenerative mitral valve w/ mild MR. Of note, she was reported to be in afib w/ RVR during time of study, w/ v-rates in the 120s-130s. She was diuresed w/ IV Lasix  and metoprolol  was increased for rate control of afib.    Readmitted again 6/24 for symptomatic afib/ w/ RVR. Limited Echo EF further reduced down to 35-40%, mod-severe MR, RV moderately reduced, RVSP 43 mmHg. Also treated for a/c CHF. Diuresed and rate control regimen further titrated.    Readmitted again 7/24 for afib w/ RVR and a/c CHF. Repeat Limited Echo 10/24, EF 30-35%, global HK, severe LAE, mod-severe MR, small pericardial effusion localized near RA but no evidence of tamponade, mod TR. Digoxin  was added to regimen. Adequate rate control was noted to be achieved prior to d/c.    Readmitted again 10/24 for the same, a/c CHF and afib w/ RVR. Admitted by internal medicine.. Continued on high dose Toprol  XL 100 mg daily and referred to [facility] clinic at d/c.    Readmitted 07/2023 for atrial fibrillation, HF exacerbation, and sciatica pain. Cardioverted and underwent L/RHC with low cardiac output and moderate disease but no intervention. Did not take the majority of her meds on discharge.     SUBJECTIVE:  Overall doing fairly well, chief complaint is sciatica pain. She notes that her breathing is at her baseline, no recent issues with fluid retention, and no further chest pain.  1 PMH, current medications, allergies, social history, and  family history reviewed in epic.  PHYSICAL EXAM: There were no vitals filed for this visit.  GENERAL: Well nourished and in no apparent distress at rest.  PULM:  Normal work of breathing, clear to auscultation bilaterally. Respirations are unlabored.  CARDIAC:  JVP: mildly elevated        Normal rate and regular rhythm. No murmurs, rubs or gallops.  1+ LE edema. Warm and well perfused extremities. ABDOMEN: Soft, non-tender, non-distended. NEUROLOGIC: Patient is oriented x3 with no focal or lateralizing neurologic deficits.    DATA REVIEW  ECG: 08/2023: Atrial fibrillation with rates in the 100s.   ECHO: 08/10/2023: LVEF 25 to 30%, mild to moderate MR, small PFO   CATH: 08/09/2023: Proximal RCA 60%, D2 80%, LCx 75%, RA 18, PA 57/32 (43), PCWP 28, Fick CO 3.41, Fick CI 1.7   ASSESSMENT & PLAN:  Chronic systolic heart failure: Known ischemic disease, LAD PCI in 2010 with progressive disease on recent heart cath, though no intervention at this time given lack of anginal symptoms. ACC/AHA Stage D cardiomyopathy. She is not an advanced therapies candidate despite her low cardiac index given her multi morbid conditions and poor medical literacy. -Continue spironolactone  12.5 mg daily, torsemide  60 milligrams twice daily, metoprolol  25 mg daily - Continue entresto  24/26mg  BID -Continue farxiga  10mg  daily   Atrial fibrillation with RVR: Unfortunately returned to atrial fibrillation in the setting of not taking her medications. Given the degree of cardiomyopathy, pulmonary disease, and unclear compliance rhythm control may not  be an achievable strategy. Will continue amidarone for now, but likely discontinue at next visit.  -Continue diuresis as above -Continue metoprolol  XL 25 mg daily -Continue Xarelto  20 mg daily -Continue amiodarone  200mg  daily   Mitral Regurgitation  -Mild to moderate on recent TEE - suspect functional MR c/b persistent afib  - HF and Afib optimization per above     CAD  - s/p LAD PCI + DES at [facility] 04/2019  - she denies any recent CP  - on ASA, statin + ? blocker   Chronic Hypoxic Respiratory Failure/COPD  - on Home O2 2L/min baseline. O2 sats 94%  - referred to [employer]  - advised to quit smoking    Untreated OSA - reports prior diagnosis but not on CPAP   Follow up in 3 months  Morene Brownie, MD Advanced Heart Failure Mechanical Circulatory Support 02/18/24

## 2024-02-21 ENCOUNTER — Telehealth (HOSPITAL_COMMUNITY): Payer: Self-pay | Admitting: *Deleted

## 2024-02-21 NOTE — Telephone Encounter (Signed)
 Called to confirm/remind patient of their appointment at the [facility] on 02/22/24.       Appointment:              [x] Confirmed             [] Left mess              [] No answer/No voice mail             [] Phone not in service   Patient reminded to bring all medications and/or complete list.   Confirmed patient has transportation. Gave directions, instructed to utilize valet parking.

## 2024-02-22 ENCOUNTER — Ambulatory Visit (HOSPITAL_COMMUNITY)
Admission: RE | Admit: 2024-02-22 | Discharge: 2024-02-22 | Disposition: A | Discharge status: Home / Self Care | Source: Ambulatory Visit | Attending: Family Medicine | Admitting: Family Medicine

## 2024-02-22 ENCOUNTER — Other Ambulatory Visit (HOSPITAL_COMMUNITY): Payer: Self-pay

## 2024-02-22 ENCOUNTER — Encounter (HOSPITAL_COMMUNITY): Payer: Self-pay

## 2024-02-22 VITALS — BP 122/68 | HR 59 | Ht 64.0 in | Wt 196.4 lb

## 2024-02-22 DIAGNOSIS — F17210 Nicotine dependence, cigarettes, uncomplicated: Secondary | ICD-10-CM | POA: Diagnosis not present

## 2024-02-22 DIAGNOSIS — I5022 Chronic systolic (congestive) heart failure: Secondary | ICD-10-CM | POA: Insufficient documentation

## 2024-02-22 DIAGNOSIS — F1910 Other psychoactive substance abuse, uncomplicated: Secondary | ICD-10-CM

## 2024-02-22 DIAGNOSIS — M5459 Other low back pain: Secondary | ICD-10-CM | POA: Insufficient documentation

## 2024-02-22 DIAGNOSIS — I4819 Other persistent atrial fibrillation: Secondary | ICD-10-CM | POA: Diagnosis not present

## 2024-02-22 DIAGNOSIS — I340 Nonrheumatic mitral (valve) insufficiency: Secondary | ICD-10-CM | POA: Diagnosis not present

## 2024-02-22 DIAGNOSIS — F1911 Other psychoactive substance abuse, in remission: Secondary | ICD-10-CM | POA: Diagnosis not present

## 2024-02-22 DIAGNOSIS — I2510 Atherosclerotic heart disease of native coronary artery without angina pectoris: Secondary | ICD-10-CM | POA: Insufficient documentation

## 2024-02-22 DIAGNOSIS — J449 Chronic obstructive pulmonary disease, unspecified: Secondary | ICD-10-CM | POA: Insufficient documentation

## 2024-02-22 DIAGNOSIS — I4729 Other ventricular tachycardia: Secondary | ICD-10-CM | POA: Diagnosis not present

## 2024-02-22 DIAGNOSIS — Z720 Tobacco use: Secondary | ICD-10-CM

## 2024-02-22 DIAGNOSIS — I493 Ventricular premature depolarization: Secondary | ICD-10-CM | POA: Diagnosis not present

## 2024-02-22 DIAGNOSIS — Q2112 Patent foramen ovale: Secondary | ICD-10-CM | POA: Insufficient documentation

## 2024-02-22 DIAGNOSIS — R29818 Other symptoms and signs involving the nervous system: Secondary | ICD-10-CM

## 2024-02-22 DIAGNOSIS — Z7901 Long term (current) use of anticoagulants: Secondary | ICD-10-CM | POA: Insufficient documentation

## 2024-02-22 DIAGNOSIS — Z955 Presence of coronary angioplasty implant and graft: Secondary | ICD-10-CM | POA: Diagnosis present

## 2024-02-22 DIAGNOSIS — Z79899 Other long term (current) drug therapy: Secondary | ICD-10-CM | POA: Diagnosis not present

## 2024-02-22 DIAGNOSIS — I472 Ventricular tachycardia, unspecified: Secondary | ICD-10-CM

## 2024-02-22 DIAGNOSIS — I5020 Unspecified systolic (congestive) heart failure: Secondary | ICD-10-CM | POA: Diagnosis present

## 2024-02-22 LAB — BASIC METABOLIC PANEL WITH GFR
Anion gap: 11 (ref 5–15)
BUN: 13 mg/dL (ref 8–23)
CO2: 29 mmol/L (ref 22–32)
Calcium: 9.2 mg/dL (ref 8.9–10.3)
Chloride: 99 mmol/L (ref 98–111)
Creatinine, Ser: 1.24 mg/dL — ABNORMAL HIGH (ref 0.44–1.00)
GFR, Estimated: 48 mL/min — ABNORMAL LOW (ref >60)
Glucose, Bld: 86 mg/dL (ref 70–99)
Potassium: 3.3 mmol/L — ABNORMAL LOW (ref 3.5–5.1)
Sodium: 139 mmol/L (ref 135–145)

## 2024-02-22 LAB — BRAIN NATRIURETIC PEPTIDE: B Natriuretic Peptide: 343.2 pg/mL — ABNORMAL HIGH (ref 0.0–100.0)

## 2024-02-22 MED ORDER — SACUBITRIL-VALSARTAN 24-26 MG PO TABS
1.0000 | ORAL_TABLET | Freq: Two times a day (BID) | ORAL | 3 refills | Status: DC
  Filled 2024-02-22: qty 60, 30d supply, fill #0
  Filled 2024-05-30: qty 60, 30d supply, fill #1

## 2024-02-22 NOTE — Progress Notes (Signed)
 ReDS Vest / Clip - 02/22/24 1505       ReDS Vest / Clip   Station Marker A    Ruler Value 28    ReDS Value Range Low volume    ReDS Actual Value 34

## 2024-02-22 NOTE — Patient Instructions (Signed)
 Medication Changes:  RESTART Entresto  24/26 mg Twice daily   Lab Work:  Labs done today, your results will be available in MyChart, we will contact you for abnormal readings.  Your physician recommends that you return for lab work in: 1-2 weeks  Special Instructions // Education:  Do the following things EVERYDAY: Weigh yourself in the morning before breakfast. Write it down and keep it in a log. Take your medicines as prescribed Eat low salt foods--Limit salt (sodium) to 2000 mg per day.  Stay as active as you can everyday Limit all fluids for the day to less than 2 liters   Follow-Up in: 4 weeks and again in 3 months   At the [facility], you and your health needs are our priority. We have a designated team specialized in the treatment of Heart Failure. This Care Team includes your primary Heart Failure Specialized Cardiologist (physician), Advanced Practice Providers (APPs- Physician Assistants and Nurse Practitioners), and Pharmacist who all work together to provide you with the care you need, when you need it.   You may see any of the following providers on your designated Care Team at your next follow up:  Dr. Toribio Fuel Dr. Ezra Shuck Dr. Ria Commander Dr. Odis Brownie Greig Mosses, NP Caffie Shed, [place] [facility] [place], [place] Beckey Coe, NP [place] Lee, NP Tinnie Redman, PharmD   Please be sure to bring in all your medications bottles to every appointment.   Need to Contact Us :  If you have any questions or concerns before your next appointment please send us  a message through [place] or call our office at [phone].    TO LEAVE A MESSAGE FOR THE NURSE SELECT OPTION 2, PLEASE LEAVE A MESSAGE INCLUDING: YOUR NAME DATE OF BIRTH CALL BACK NUMBER REASON FOR CALL**this is important as we prioritize the call backs  YOU WILL RECEIVE A CALL BACK THE SAME DAY AS LONG AS YOU CALL BEFORE 4:00 PM

## 2024-02-22 NOTE — Progress Notes (Deleted)
  Electrophysiology Office Note:   Date:  02/22/2024  ID:  Autumn [name], DOB 1956/08/03, MRN [id number]  Primary Cardiologist: Dub Huntsman, DO Electrophysiologist: Eulas FORBES Furbish, MD  {Click to update primary MD,subspecialty MD or APP then REFRESH:1}    History of Present Illness:   Autumn [name] is a 68 y.o. female with h/o systolic CHF, AF with RVR, MR, CAD, COPD, and h/o substance abuse  seen today for post hospital follow up.    Admitted ***  Since discharge from hospital the patient reports doing ***.  she denies chest pain, palpitations, dyspnea, PND, orthopnea, nausea, vomiting, dizziness, syncope, edema, weight gain, or early satiety.   Review of systems complete and found to be negative unless listed in HPI.   EP Information / Studies Reviewed:    EKG is ordered today. Personal review as below.       Arrhythmia/Device History No specialty comments available.   Physical Exam:   VS:  There were no vitals taken for this visit.   Wt Readings from Last 3 Encounters:  02/22/24 196 lb 6.4 oz (89.1 kg)  02/05/24 187 lb 6.3 oz (85 kg)  01/29/24 187 lb 13.3 oz (85.2 kg)     GEN: No acute distress NECK: No JVD; No carotid bruits CARDIAC: {EPRHYTHM:28826}, no murmurs, rubs, gallops RESPIRATORY:  Clear to auscultation without rales, wheezing or rhonchi  ABDOMEN: Soft, non-tender, non-distended EXTREMITIES:  {EDEMA LEVEL:28147::No} edema; No deformity   ASSESSMENT AND PLAN:    PMVT / VF Torsades de Pointes Potentially in the setting of bradycardia with PVCs and prolonged qt on IV amiodarone . 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  EKG today shows *** Continue amiodarone  200 mg daily With recent concerns for discitis not currently a candidate for transvenous ICD.    Persistent AF with RVR Remains in NSR.   Not candidate for AF ablation with other issues and non-compliance.  Continue eliquis  5 mg BID   CAD Cath 07/2023 with patent LAD stent bu 75% LCx  lesion and 60% RCA lesion S/p PTCA and stent to mid Lcx and LAD this am.    Substance Abuse Concerns for discitis by MRI Medical non-compliance She is not currently a candidate for EP procedures.   Follow up with MD only in 2-3 months to further evaluate recovery and discuss long term plan  Signed, Ozell Prentice Passey, PA-C

## 2024-02-23 ENCOUNTER — Ambulatory Visit (HOSPITAL_COMMUNITY): Payer: Self-pay | Admitting: Family Medicine

## 2024-02-24 ENCOUNTER — Ambulatory Visit: Attending: Student | Admitting: Student

## 2024-02-25 ENCOUNTER — Encounter: Payer: Self-pay | Admitting: Student

## 2024-02-29 ENCOUNTER — Other Ambulatory Visit (HOSPITAL_COMMUNITY)

## 2024-03-03 ENCOUNTER — Telehealth: Payer: Self-pay | Admitting: Internal Medicine

## 2024-03-03 NOTE — Telephone Encounter (Signed)
 While confirming her upcoming appt on 7/28, Autumn [name] also asked if her MRI results have been received for Dr. Dennise to review. Pt can be contacted at [phone].

## 2024-03-06 ENCOUNTER — Ambulatory Visit: Payer: Self-pay | Admitting: Internal Medicine

## 2024-03-06 ENCOUNTER — Telehealth: Payer: Self-pay | Admitting: Internal Medicine

## 2024-03-06 NOTE — Telephone Encounter (Signed)
 6/28 MRI did note infection. She has not seen by ID at [facility] before.

## 2024-03-06 NOTE — Progress Notes (Deleted)
 Patient: Autumn [name]  DOB: [birth year] MRN: [id number] PCP: Bobbette Coye LABOR, MD  Referring Provider: ***  No chief complaint on file.    Patient Active Problem List   Diagnosis Date Noted   Paroxysmal ventricular tachycardia (HCC) 01/28/2024   Chronic hypoxic respiratory failure, on home oxygen  therapy (HCC) 01/28/2024   Chronic low blood pressure 01/28/2024   AKI (acute kidney injury) (HCC) 01/23/2024   Anxiety state 01/20/2024   Chronic left-sided low back pain 01/18/2024   Atrial fibrillation (HCC) 08/06/2023   Sciatica of left side 08/06/2023   Hypokalemia 08/06/2023   Acute exacerbation of CHF (congestive heart failure) (HCC) 05/31/2023   Acute on chronic congestive heart failure (HCC) 02/22/2023   Paroxysmal atrial fibrillation (HCC) 02/22/2023   Acute hypoxic respiratory failure (HCC) 02/17/2023   Acute on chronic systolic CHF (congestive heart failure) (HCC) 01/18/2023   QT prolongation 12/05/2022   COPD (chronic obstructive pulmonary disease) (HCC) 12/03/2022   Parkinson's disease (HCC) 12/03/2022   Persistent atrial fibrillation (HCC) 12/03/2022   PAD (peripheral artery disease) (HCC) 12/03/2022   S/P primary angioplasty with coronary stent; mid LAD and LCX 12/03/2022   Atrial fibrillation with rapid ventricular response (HCC) 01/11/2021   Homelessness 01/11/2021   Alcohol dependence with uncomplicated withdrawal (HCC) 06/27/2017   History of substance use disorder 06/27/2017   Depression 06/27/2017   Essential hypertension 06/27/2017   GERD (gastroesophageal reflux disease) 06/27/2017   Attention deficit disorder (ADD)      Subjective:  Autumn [name] is a 68 y.o. 6 is had finished with past medical history of systolic heart failure, CAD status post prior DES in LAD, A-fib, MR followed by cardiology,, ADD, COPD, drug abuse, hyperlipidemia, fibromyalgia presents for abnormal MRI with concern for osteomyelitis.  She was admitted to [facility] 6/9 - 6/21  after presenting with hip pain found to have A-fib with RVR and heart failure with reduced ejection fraction.  Found to have prolonged QTc as well.  Given cardiology follow-up.  In the setting left hip pain MRI was done which showed degenerative disc or discitis/osteomyelitis at L3-L4 recommended 2-week follow-up study.  At that point ID was called and given follow-up appointment to follow-up on the MRI.  She had presented again with back pain to the ED repeat MRI was done on 6/28 which noted unchanged MRI appearance of the lumbar spine/6/16 arguing against spinal infection and in favor of acute exacerbation of severe degenerative disease more pronounced at L3-L4.  Sorry MRI and there  ROS  Past Medical History:  Diagnosis Date   Anxiety    Arthritis    Attention deficit disorder (ADD)    Chronic hypoxic respiratory failure, on home oxygen  therapy (HCC) 01/28/2024   Chronic left-sided low back pain 01/18/2024   COPD (chronic obstructive pulmonary disease) (HCC)    Depression    Drug abuse (HCC)    Fibromyalgia    HFrEF (heart failure with reduced ejection fraction) (HCC)    History of substance use disorder 06/27/2017   Hyperlipidemia    Paroxysmal Atrial fibrillation    Paroxysmal ventricular tachycardia (HCC) 01/28/2024   S/P primary angioplasty with coronary stent 12/03/2022    Outpatient Medications Prior to Visit  Medication Sig Dispense Refill   acetaminophen  (TYLENOL ) 500 MG tablet Take 2 tablets (1,000 mg total) by mouth every 6 (six) hours. 30 tablet 0   albuterol  (VENTOLIN  HFA) 108 (90 Base) MCG/ACT inhaler Inhale 2 puffs into the lungs every 4 (four) hours as  needed for wheezing.     amiodarone  (PACERONE ) 200 MG tablet Take 1 tablet (200 mg total) by mouth daily. 60 tablet 0   apixaban  (ELIQUIS ) 5 MG TABS tablet Take 1 tablet (5 mg total) by mouth 2 (two) times daily. 180 tablet 0   atorvastatin  (LIPITOR ) 80 MG tablet Take 1 tablet (80 mg total) by mouth daily. 90 tablet 0    Budeson-Glycopyrrol-Formoterol  (BREZTRI  AEROSPHERE) 160-9-4.8 MCG/ACT AERO Inhale 2 puffs into the lungs in the morning and at bedtime. 10.7 g 0   clopidogrel  (PLAVIX ) 75 MG tablet Take 1 tablet (75 mg total) by mouth daily. 60 tablet 0   dapagliflozin  propanediol (FARXIGA ) 10 MG TABS tablet Take 1 tablet (10 mg total) by mouth daily. 60 tablet 0   DULoxetine  (CYMBALTA ) 60 MG capsule Take 1 capsule (60 mg total) by mouth daily. 90 capsule 0   lidocaine  (LIDODERM ) 5 % Place 1 patch onto the skin daily. Remove & Discard patch within 12 hours or as directed by MD 30 patch 0   Menthol -Methyl Salicylate  (MUSCLE RUB) 10-15 % CREA Apply 1 Application topically 3 (three) times daily. 85 g 0   methocarbamol  (ROBAXIN ) 750 MG tablet Take 1 tablet (750 mg total) by mouth every 6 (six) hours as needed for muscle spasms. 60 tablet 0   metoprolol  succinate (TOPROL -XL) 25 MG 24 hr tablet Take 1 tablet (25 mg total) by mouth daily. 90 tablet 0   naloxone  (NARCAN ) nasal spray 4 mg/0.1 mL Place 4 mg into the nose as needed (overdose, as needed).     nicotine  (NICODERM CQ  - DOSED IN MG/24 HOURS) 14 mg/24hr patch Place 1 patch (14 mg total) onto the skin daily. 28 patch 0   pregabalin  (LYRICA ) 75 MG capsule Take 1 capsule (75 mg total) by mouth 2 (two) times daily. 60 capsule 0   sacubitril -valsartan  (ENTRESTO ) 24-26 MG Take 1 tablet by mouth 2 (two) times daily. 60 tablet 3   senna-docusate (SENOKOT-S) 8.6-50 MG tablet Take 1 tablet by mouth 2 (two) times daily. 60 tablet 1   spironolactone  (ALDACTONE ) 25 MG tablet Take 1 tablet (25 mg total) by mouth daily. 90 tablet 0   torsemide  (DEMADEX ) 20 MG tablet Take 2 tablets (40 mg total) by mouth daily. 120 tablet 0   No facility-administered medications prior to visit.     Allergies  Allergen Reactions   Pamelor [Nortriptyline Hcl] Anaphylaxis   Vibra-Tab [Doxycycline] Other (See Comments)    Unknown reaction    Social History   Tobacco Use   Smoking status:  Every Day    Current packs/day: 0.50    Average packs/day: 0.5 packs/day for 20.0 years (10.0 ttl pk-yrs)    Types: Cigarettes   Smokeless tobacco: Never   Tobacco comments:    Half pack daily going to try patches  Vaping Use   Vaping status: Every Day   Substances: Nicotine , Flavoring  Substance Use Topics   Alcohol use: Not Currently   Drug use: Yes    Types: Cocaine, Marijuana, Methamphetamines    Comment: MJ, Cocaine and Crystal Meth    No family history on file.  Objective:  There were no vitals filed for this visit. There is no height or weight on file to calculate BMI.  Physical Exam  Lab Results: Lab Results  Component Value Date   WBC 10.1 02/05/2024   HGB 12.7 02/05/2024   HCT 40.5 02/05/2024   MCV 93.8 02/05/2024   PLT 276 02/05/2024    Lab  Results  Component Value Date   CREATININE 1.24 (H) 02/22/2024   BUN 13 02/22/2024   NA 139 02/22/2024   K 3.3 (L) 02/22/2024   CL 99 02/22/2024   CO2 29 02/22/2024    Lab Results  Component Value Date   ALT 68 (H) 01/17/2024   AST 34 01/17/2024   ALKPHOS 60 01/17/2024   BILITOT 1.0 01/17/2024     Assessment & Plan:   Problem List Items Addressed This Visit   None   *** will return to clinic in *** {weeks/months} for follow up  Loney Stank, MD [facility]   03/06/24  9:24 AM

## 2024-03-17 NOTE — Progress Notes (Incomplete)
 [facility] FOLLOW UP CLINIC NOTE  Primary Care: Corrington, Kip A, MD HF Cardiologist: Dr. Zenaida  HPI: Autumn [name] is a 68 y.o. female PMH of h/o systolic heart failure, CAD s/p prior DES to LAD, atrial fibrillation, mitral regurgitation and COPD.  Echo 6/22 EF 60-65%, RV normal.   Admitted 4/24 for acute CHF. Echo showed drop in EF down to 40-45%, diffuse HK, RV normal, calcified/degenerative mitral valve w/ mild MR. Of note, she was reported to be in afib w/ RVR during time of study, w/ v-rates in the 120s-130s. She was diuresed w/ IV Lasix  and metoprolol  was increased for rate control of afib.    Readmitted again 6/24 for symptomatic afib/ w/ RVR. Limited Echo EF further reduced down to 35-40%, mod-severe MR, RV moderately reduced, RVSP 43 mmHg. Also treated for a/c CHF. Diuresed and rate control regimen further titrated.    Readmitted again 7/24 for afib w/ RVR and a/c CHF. Repeat Limited Echo 10/24, EF 30-35%, global HK, severe LAE, mod-severe MR, small pericardial effusion localized near RA but no evidence of tamponade, mod TR. Digoxin  was added to regimen. Adequate rate control was noted to be achieved prior to d/c.    Readmitted again 10/24 for the same, a/c CHF and afib w/ RVR. Admitted by internal medicine.. Continued on high dose Toprol  XL 100 mg daily and referred to [facility] clinic at d/c.    Readmitted 07/2023 for atrial fibrillation, HF exacerbation, and sciatica pain. Cardioverted and underwent L/RHC with low cardiac output and moderate disease but no intervention. Did not take the majority of her meds on discharge.  Admitted 6/25 with a/c HF and AF with RVR, had been off all medications. UDS + benzos, amphetamines and THC. Loaded with IV amiodarone  and underwent TEE-guided DCCV, back into NSR on 2nd shock. TEE showed LVEF 30-35%, mild RV dysfunction, moderate TR, severe MR.  Diuretics stopped d/t rising Scr and hypotension. Received 500 cc bolus LR Then back in  AF. She had polymorphic VT. Take to cathlab and had PCI to mLCx and mLAD. Discharged home on LifeVest, weight 176 lbs.   SUBJECTIVE: Today she returns for post hospital HF follow up. Main complaint is chronic back pain/sciatica. Asking for prednisone  and Ultram . Pain limits her, she is SOB walking short distances on flat ground. She has worn her oxygen  once since discharge. Denies palpitations, abnormal bleeding, CP, dizziness, edema, or PND/Orthopnea. Appetite ok.  She does not weigh at home. Taking all medications. Not wearing LifeVest. Smokes 5 cigs/day, no ETOH. No meth since 12/2023.  Not interested in sleep study.  PMH, current medications, allergies, social history, and family history reviewed in epic.  Wt Readings from Last 3 Encounters:  02/22/24 89.1 kg (196 lb 6.4 oz)  02/05/24 85 kg (187 lb 6.3 oz)  01/29/24 85.2 kg (187 lb 13.3 oz)   There were no vitals taken for this visit.  PHYSICAL EXAM: General:  NAD. No resp difficulty, arrived in [facility], chronically-ill appearing. HEENT: Normal Neck: Supple. No JVD. Cor: Regular rate & rhythm. No rubs, gallops or murmurs. Lungs: Diminished in lower lobes Abdomen: Soft, nontender, nondistended.  Extremities: No cyanosis, clubbing, rash, 1+ BLE edema Neuro: Alert & oriented x 3, moves all 4 extremities w/o difficulty. Affect pleasant.  LifeVest interrogation (personally reviewed): no treatments, average HR 64 bpm, 371 average daily steps  ReDs reading: 34%, normal  ECG (personally reviewed): refused ECG today.  DATA REVIEW  ECG: 08/2023: Atrial fibrillation with rates in  the 100s.   ECHO: 08/10/2023: LVEF 25 to 30%, mild to moderate MR, small PFO 01/19/24: EF 30-35%, RV mildly reduced, severe MR and severe TR 01/21/24: EF 35-40%, G3DD , mildly reduced MR   CATH: 08/09/2023: Proximal RCA 60%, D2 80%, LCx 75%, RA 18, PA 57/32 (43), PCWP 28, Fick CO 3.41, Fick CI 1.7 LHC 01/27/24: s/p PCI to mLCx and mLAD  ASSESSMENT & PLAN: 1.  Chronic systolic CHF: Echo in 6/22 with EF 60-65%. Had multiple echoes in 2024, each with lower EF.  Echo in 10/24 with EF 30-35%, moderate-severe MR.  She has a history of CAD but denies recent chest pain. Cath 12/24 showed significant CAD but no critical lesion. TEE 12/24 with EF 25-30%, moderate RV dysfunction, moderate MR. 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  TEE 6/25 showed EF 30-35%, moderate LV dilation, mild RV dysfunction, moderate TR, severe mitral regurgitation with restriction of the posterior leaflet and malcoaptation.  - NYHA III, functional status confounded by back pain and COPD. Volume stable, ReDS 24% - Re-start Entresto  24/26 mg bid - Continue torsemide  40 mg daily. - Continue Farxiga  10 mg daily - Continue Toprol  XL 25 mg daily - Continue spiro 25 mg daily - She is not a candidate for advanced therapies with active substance abuse and noncompliance.  - Labs today, repeat BMEt in 2 weeks - Gifted a scale today   2. Atrial fibrillation: Persistent, multiple admissions with AF/RVR.  Cardioverted to NSR in 12/24 but stopped amiodarone  and Xarelto  when she went home and went back into AF.  She was in AF in 1/25, no ECGs after that until this admission when she was in AF with RVR.  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  She does not tolerate AF well. She had TEE-guided DCCV 01/19/24 and back in NSR. Regular on exam today, she refused ECG today. - Continue amiodarone  200 mg daily. - Continue Toprol  XL 25 daily.   - Continue Eliquis  5 mg bid. No bleeding issues. - Would favor eventual AF ablation. He has EP follow up.   3. 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 Moderate on TEE 12/24.  Severe mitral regurgitation on TEE 01/19/24 with restricted posterior leaflet and malcoaptation.  - Repeat echo 01/21/24 showed significant improvement , now mild to moderate MR, post cardioversion  and diureses   4. CAD: s/p DES to LAD in 9/20.   - Had polymorphic VT 01/26/24.  - s/p PCI with PTCA/DES to [facility] and PTCA/DES mLAD on 01/27/24 - Continue atorvastatin  80 mg daily - Continue Plavix  75 mg daily. - Continue Aspirin  x 1 month then stop, due to [facility] (stop date 02/29/24) - Consider CR however chronic pain likely will prevent any meaningful participation  5. Polymorphic VT/PVCs - 01/26/24 lost conciousness with sustained polymorphic VT.  - Felt to be possibly d/t ischemia +/- combination of prolonged QT/sinus brady/PVCs.  - Now s/p PCI. - Recommend she continue LifeVest, she refuses. Not a candidate for device currently with question of discitis. - Continue amiodarone , per EP. - He has EP follow up   6. COPD: She smokes and uses home oxygen  at times.  - Needs to quit smoking.    7. Back pain: Chronic issue - MRI with severe L3-L4 and L4-L5 with severe spinal stenosis and concern for possible discitis.  - No growth on BCx during admission and no leukocytosis. ESR and CRP ok. Low suspicion for infection -  Management  per PCP or Ortho    8. OSA: Suspected.  - Will need sleep study. We discussed this today however she declines   9. PFO: Small, noted on TEE.    10. Substance abuse: Previous meth, cocaine and xanax  use. Self-medicating for depression and back pain.  - Prev treated with Suboxone , encouraged her to reconsider MAT.  - She is on Cymbalta   Follow up in 3 months with Dr. Zenaida Harlene Gainer, FNP-BC [facility] 03/17/24

## 2024-03-20 ENCOUNTER — Telehealth (HOSPITAL_COMMUNITY): Payer: Self-pay | Admitting: *Deleted

## 2024-03-20 NOTE — Telephone Encounter (Signed)
 Called to confirm/remind patient of their appointment at the [facility] on 03/21/24.     Appointment:              [] Confirmed             [] Left mess              [x] No answer/No voice mail             [] Phone not in service   Patient reminded to bring all medications and/or complete list.   Confirmed patient has transportation. Gave directions, instructed to utilize valet parking.

## 2024-03-21 ENCOUNTER — Encounter (HOSPITAL_COMMUNITY)

## 2024-03-22 NOTE — Telephone Encounter (Signed)
 LOV: 03/21/24 POV: 05/02/24  1 RX(s) pended

## 2024-03-24 ENCOUNTER — Other Ambulatory Visit (HOSPITAL_COMMUNITY): Payer: Self-pay

## 2024-03-27 ENCOUNTER — Ambulatory Visit (HOSPITAL_COMMUNITY)
Admission: RE | Admit: 2024-03-27 | Discharge: 2024-03-27 | Disposition: A | Discharge status: Home / Self Care | Source: Ambulatory Visit | Attending: Internal Medicine | Admitting: Internal Medicine

## 2024-03-27 ENCOUNTER — Encounter (HOSPITAL_COMMUNITY): Payer: Self-pay

## 2024-03-27 VITALS — BP 138/80 | HR 67 | Wt 190.6 lb

## 2024-03-27 DIAGNOSIS — I4819 Other persistent atrial fibrillation: Secondary | ICD-10-CM | POA: Diagnosis not present

## 2024-03-27 DIAGNOSIS — M549 Dorsalgia, unspecified: Secondary | ICD-10-CM

## 2024-03-27 DIAGNOSIS — Q2112 Patent foramen ovale: Secondary | ICD-10-CM | POA: Diagnosis not present

## 2024-03-27 DIAGNOSIS — J449 Chronic obstructive pulmonary disease, unspecified: Secondary | ICD-10-CM | POA: Insufficient documentation

## 2024-03-27 DIAGNOSIS — I472 Ventricular tachycardia, unspecified: Secondary | ICD-10-CM

## 2024-03-27 DIAGNOSIS — I2510 Atherosclerotic heart disease of native coronary artery without angina pectoris: Secondary | ICD-10-CM | POA: Insufficient documentation

## 2024-03-27 DIAGNOSIS — I4891 Unspecified atrial fibrillation: Secondary | ICD-10-CM | POA: Diagnosis present

## 2024-03-27 DIAGNOSIS — Z955 Presence of coronary angioplasty implant and graft: Secondary | ICD-10-CM | POA: Diagnosis not present

## 2024-03-27 DIAGNOSIS — F1910 Other psychoactive substance abuse, uncomplicated: Secondary | ICD-10-CM

## 2024-03-27 DIAGNOSIS — M5459 Other low back pain: Secondary | ICD-10-CM | POA: Diagnosis not present

## 2024-03-27 DIAGNOSIS — R29818 Other symptoms and signs involving the nervous system: Secondary | ICD-10-CM

## 2024-03-27 DIAGNOSIS — Z91199 Patient's noncompliance with other medical treatment and regimen due to unspecified reason: Secondary | ICD-10-CM | POA: Diagnosis not present

## 2024-03-27 DIAGNOSIS — Z7901 Long term (current) use of anticoagulants: Secondary | ICD-10-CM | POA: Diagnosis not present

## 2024-03-27 DIAGNOSIS — I340 Nonrheumatic mitral (valve) insufficiency: Secondary | ICD-10-CM | POA: Diagnosis not present

## 2024-03-27 DIAGNOSIS — I493 Ventricular premature depolarization: Secondary | ICD-10-CM | POA: Diagnosis not present

## 2024-03-27 DIAGNOSIS — I5022 Chronic systolic (congestive) heart failure: Secondary | ICD-10-CM | POA: Insufficient documentation

## 2024-03-27 DIAGNOSIS — F1210 Cannabis abuse, uncomplicated: Secondary | ICD-10-CM | POA: Diagnosis not present

## 2024-03-27 DIAGNOSIS — Z79899 Other long term (current) drug therapy: Secondary | ICD-10-CM | POA: Insufficient documentation

## 2024-03-27 DIAGNOSIS — G8929 Other chronic pain: Secondary | ICD-10-CM | POA: Insufficient documentation

## 2024-03-27 LAB — BASIC METABOLIC PANEL WITH GFR
Anion gap: 11 (ref 5–15)
BUN: 13 mg/dL (ref 8–23)
CO2: 31 mmol/L (ref 22–32)
Calcium: 9.3 mg/dL (ref 8.9–10.3)
Chloride: 95 mmol/L — ABNORMAL LOW (ref 98–111)
Creatinine, Ser: 1.24 mg/dL — ABNORMAL HIGH (ref 0.44–1.00)
GFR, Estimated: 48 mL/min — ABNORMAL LOW (ref >60)
Glucose, Bld: 111 mg/dL — ABNORMAL HIGH (ref 70–99)
Potassium: 3.9 mmol/L (ref 3.5–5.1)
Sodium: 137 mmol/L (ref 135–145)

## 2024-03-27 LAB — BRAIN NATRIURETIC PEPTIDE: B Natriuretic Peptide: 77.5 pg/mL (ref 0.0–100.0)

## 2024-03-27 NOTE — Patient Instructions (Signed)
 Medication Changes:  None, continue current medications  Lab Work:  Labs done today, your results will be available in MyChart, we will contact you for abnormal readings.   Special Instructions // Education:  Do the following things EVERYDAY: Weigh yourself in the morning before breakfast. Write it down and keep it in a log. Take your medicines as prescribed Eat low salt foods--Limit salt (sodium) to 2000 mg per day.  Stay as active as you can everyday Limit all fluids for the day to less than 2 liters   Follow-Up in: 2 months with Dr Rolan  **Please call [facility] to reschedule your missed appointment last week, [phone]   At the [facility], you and your health needs are our priority. We have a designated team specialized in the treatment of Heart Failure. This Care Team includes your primary Heart Failure Specialized Cardiologist (physician), Advanced Practice Providers (APPs- Physician Assistants and Nurse Practitioners), and Pharmacist who all work together to provide you with the care you need, when you need it.   You may see any of the following providers on your designated Care Team at your next follow up:  Dr. Toribio Fuel Dr. Ezra Rolan Dr. Ria Commander Dr. Odis Brownie Greig Mosses, NP Caffie Shed, [place] [facility] [place], [place] Beckey Coe, NP [place] Lee, NP Tinnie Redman, PharmD   Please be sure to bring in all your medications bottles to every appointment.   Need to Contact Us :  If you have any questions or concerns before your next appointment please send us  a message through [place] or call our office at [phone].    TO LEAVE A MESSAGE FOR THE NURSE SELECT OPTION 2, PLEASE LEAVE A MESSAGE INCLUDING: YOUR NAME DATE OF BIRTH CALL BACK NUMBER REASON FOR CALL**this is important as we prioritize the call backs  YOU WILL RECEIVE A CALL BACK THE SAME DAY AS LONG AS YOU CALL BEFORE 4:00 PM

## 2024-03-27 NOTE — Progress Notes (Addendum)
 ADVANCED HEART FAILURE FOLLOW UP CLINIC NOTE  Primary Care: Corrington, Kip A, MD HF Cardiologist: Dr. Zenaida  HPI: Autumn [name] is a 68 y.o. female PMH of h/o systolic heart failure, CAD s/p prior DES to LAD, atrial fibrillation, mitral regurgitation and COPD.  Echo 6/22 EF 60-65%, RV normal.   Admitted 4/24 for acute CHF. Echo showed drop in EF down to 40-45%, diffuse HK, RV normal, calcified/degenerative mitral valve w/ mild MR. Of note, she was reported to be in afib w/ RVR during time of study, w/ v-rates in the 120s-130s. She was diuresed w/ IV Lasix  and metoprolol  was increased for rate control of afib.    Readmitted again 6/24 for symptomatic afib/ w/ RVR. Limited Echo EF further reduced down to 35-40%, mod-severe MR, RV moderately reduced, RVSP 43 mmHg. Also treated for a/c CHF. Diuresed and rate control regimen further titrated.    Readmitted again 7/24 for afib w/ RVR and a/c CHF. Repeat Limited Echo 10/24, EF 30-35%, global HK, severe LAE, mod-severe MR, small pericardial effusion localized near RA but no evidence of tamponade, mod TR. Digoxin  was added to regimen. Adequate rate control was noted to be achieved prior to d/c.    Readmitted again 10/24 for the same, a/c CHF and afib w/ RVR. Admitted by internal medicine.. Continued on high dose Toprol  XL 100 mg daily and referred to [facility] clinic at d/c.    Readmitted 07/2023 for atrial fibrillation, HF exacerbation, and sciatica pain. Cardioverted and underwent L/RHC with low cardiac output and moderate disease but no intervention. Did not take the majority of her meds on discharge.  Admitted 6/25 with a/c HF and AF with RVR, had been off all medications. UDS + benzos, amphetamines and THC. Loaded with IV amiodarone  and underwent TEE-guided DCCV, back into NSR on 2nd shock. TEE showed LVEF 30-35%, mild RV dysfunction, moderate TR, severe MR.  Diuretics stopped d/t rising Scr and hypotension. Received 500 cc bolus LR Then back in  AF. She had polymorphic VT. Take to cathlab and had PCI to mLCx and mLAD. Discharged home on LifeVest, weight 176 lbs.   SUBJECTIVE: Today she returns for AHF follow up. Overall feeling ok just tearful as she is in a lot of sciatic pain today. Denies palpitations, CP, dizziness, or PND/Orthopnea. Intt swelling in her legs. No SOB. Appetite good. No fever or chills. Weight at home 190 pounds. Taking all medications. Denies ETOH or tobacco use. Smokes 3 blunts daily. BP at home 120-130/70s. Drinks around 120oz day of water + a couple of sodas a day. Has opted not to wear LifeVest.   PMH, current medications, allergies, social history, and family history reviewed in epic.  Wt Readings from Last 3 Encounters:  03/27/24 86.5 kg (190 lb 9.6 oz)  02/22/24 89.1 kg (196 lb 6.4 oz)  02/05/24 85 kg (187 lb 6.3 oz)   BP 138/80   Pulse 67   Wt 86.5 kg (190 lb 9.6 oz)   SpO2 98%   BMI 32.72 kg/m   PHYSICAL EXAM: General:  frail appearing.  No respiratory difficulty. Arrived in [facility].  Neck: JVD flat  Cor: Regular rate & rhythm. No murmurs. Lungs: clear, diminished bases Extremities: no edema  Neuro: alert & oriented x 3. Affect flat/ tearful.   LifeVest interrogation (personally reviewed): Has not worn. Nothing on device interrogation  ReDs reading: 30 %, normal   ECG (personally reviewed): NSR 67 bpm  DATA REVIEW  ECG: 08/2023: Atrial fibrillation with rates  in the 100s. 8/25: NSR 67 bpm   ECHO: 08/10/2023: LVEF 25 to 30%, mild to moderate MR, small PFO 01/19/24: EF 30-35%, RV mildly reduced, severe MR and severe TR 01/21/24: EF 35-40%, G3DD , mildly reduced MR   CATH: 08/09/2023: Proximal RCA 60%, D2 80%, LCx 75%, RA 18, PA 57/32 (43), PCWP 28, Fick CO 3.41, Fick CI 1.7 LHC 01/27/24: s/p PCI to mLCx and mLAD  ASSESSMENT & PLAN: 1. Chronic systolic CHF: Echo in 6/22 with EF 60-65%. Had multiple echoes in 2024, each with lower EF.  Echo in 10/24 with EF 30-35%, moderate-severe MR.  She  has a history of CAD but denies recent chest pain. Cath 12/24 showed significant CAD but no critical lesion. TEE 12/24 with EF 25-30%, moderate RV dysfunction, moderate MR. 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  TEE 6/25 showed EF 30-35%, moderate LV dilation, mild RV dysfunction, moderate TR, severe mitral regurgitation with restriction of the posterior leaflet and malcoaptation.  - NYHA III, functional status confounded by back pain and COPD. Volume stable, ReDS 30% - Continue Entresto  24/26 mg bid - Continue torsemide  40 mg daily. - Continue Farxiga  10 mg daily - Continue Toprol  XL 25 mg daily - Continue spiro 25 mg daily - Suspect BP elevated today in the setting of pain - She is not a candidate for advanced therapies with ongoing substance abuse and noncompliance.  - Labs today   2. Atrial fibrillation: Persistent, multiple admissions with AF/RVR.  Cardioverted to NSR in 12/24 but stopped amiodarone  and Xarelto  when she went home and went back into AF.  She was in AF in 1/25, no ECGs after that until this admission when she was in AF with RVR.  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  She does not tolerate AF well. She had TEE-guided DCCV 01/19/24 and back in NSR. Regular on exam today and on EKG.  - Continue amiodarone  200 mg daily. - Continue Toprol  XL 25 daily.   - Continue Eliquis  5 mg bid. No bleeding issues. - Would favor eventual AF ablation. No showed to last EP visit. EP office number provided on AVS for rescheduling.    3. 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 Moderate on TEE 12/24.  Severe mitral regurgitation on TEE 01/19/24 with restricted posterior leaflet and malcoaptation.  - Repeat echo 01/21/24 showed significant improvement , now mild to moderate MR, post cardioversion and diureses   4. CAD: s/p DES to LAD in 9/20.   - Had polymorphic VT 01/26/24.  - s/p PCI with  PTCA/DES to [facility] and PTCA/DES mLAD on 01/27/24 - Continue atorvastatin  80 mg daily - Continue Plavix  75 mg daily. - Consider CR however chronic pain likely will prevent any meaningful participation  5. Polymorphic VT/PVCs - 01/26/24 lost conciousness with sustained polymorphic VT.  - Felt to be possibly d/t ischemia +/- combination of prolonged QT/sinus brady/PVCs.  - Now s/p PCI. - Recommend she continue LifeVest, she continues to refuse. She does have at home. Not a candidate for device currently with question of discitis. - Continue amiodarone , per EP. - Needs to reschedule EP f/u.    6. COPD: She smokes and uses home oxygen  at times.  - Reports no further tobacco use but is smoking marijuana TID - cessation advised.    7. Back pain: Chronic issue - MRI with severe L3-L4 and L4-L5 with severe spinal stenosis and concern for possible discitis.  - No growth  on BCx during admission and no leukocytosis. ESR and CRP ok. Low suspicion for infection - Management per PCP or Ortho    8. OSA: Suspected.  - Will need sleep study. We discussed this again today and she declines   9. PFO: Small, noted on TEE.    10. Substance abuse: Previous meth, cocaine and xanax  use. Self-medicating for depression and back pain.  - Prev treated with Suboxone , encouraged her to reconsider MAT.  - She is on Cymbalta  - Smoking marijuana daily, cessation advised.   Follow up in 2 months with Dr. Zenaida.   Beckey LITTIE Coe AGACNP-BC  Advanced Heart Failure 03/27/24

## 2024-03-27 NOTE — Addendum Note (Signed)
 Encounter addended by: Hayes Beckey CROME, NP on: 03/27/2024 4:59 PM  Actions taken: Clinical Note Signed

## 2024-03-27 NOTE — Progress Notes (Signed)
 ReDS Vest / Clip - 03/27/24 1500       ReDS Vest / Clip   Station Marker B    Ruler Value 32    ReDS Value Range Low volume    ReDS Actual Value 30

## 2024-03-28 ENCOUNTER — Ambulatory Visit (HOSPITAL_COMMUNITY): Payer: Self-pay | Admitting: Internal Medicine

## 2024-05-17 ENCOUNTER — Encounter (HOSPITAL_COMMUNITY): Admitting: Cardiology

## 2024-05-30 ENCOUNTER — Other Ambulatory Visit (HOSPITAL_COMMUNITY): Payer: Self-pay

## 2024-05-30 ENCOUNTER — Other Ambulatory Visit: Payer: Self-pay

## 2024-06-09 ENCOUNTER — Other Ambulatory Visit (HOSPITAL_COMMUNITY): Payer: Self-pay

## 2024-07-10 ENCOUNTER — Other Ambulatory Visit (HOSPITAL_COMMUNITY): Payer: Self-pay | Admitting: Cardiology

## 2024-07-10 MED ORDER — SACUBITRIL-VALSARTAN 24-26 MG PO TABS: 1.0000 | ORAL_TABLET | Freq: Two times a day (BID) | ORAL | 3 refills | Status: AC

## 2024-07-12 ENCOUNTER — Other Ambulatory Visit (HOSPITAL_COMMUNITY): Payer: Self-pay

## 2024-07-16 ENCOUNTER — Other Ambulatory Visit: Payer: Self-pay

## 2024-07-16 ENCOUNTER — Other Ambulatory Visit (HOSPITAL_COMMUNITY): Payer: Self-pay

## 2024-07-19 ENCOUNTER — Other Ambulatory Visit (HOSPITAL_BASED_OUTPATIENT_CLINIC_OR_DEPARTMENT_OTHER): Payer: Self-pay

## 2024-07-20 ENCOUNTER — Other Ambulatory Visit (HOSPITAL_COMMUNITY): Payer: Self-pay

## 2024-07-24 ENCOUNTER — Other Ambulatory Visit (HOSPITAL_COMMUNITY): Payer: Self-pay

## 2024-08-21 ENCOUNTER — Other Ambulatory Visit: Payer: Self-pay

## 2024-08-21 ENCOUNTER — Other Ambulatory Visit (HOSPITAL_COMMUNITY): Payer: Self-pay

## 2024-09-15 ENCOUNTER — Other Ambulatory Visit (HOSPITAL_COMMUNITY): Payer: Self-pay
